# Patient Record
Sex: Female | Born: 1945 | Race: White | Hispanic: No | Marital: Married | Smoking: Never smoker
Health system: Southern US, Community
[De-identification: ages and names within clinical notes are randomized; demographics above are authoritative.]

## PROBLEM LIST (undated history)

## (undated) DIAGNOSIS — J45909 Unspecified asthma, uncomplicated: Secondary | ICD-10-CM

## (undated) DIAGNOSIS — K449 Diaphragmatic hernia without obstruction or gangrene: Secondary | ICD-10-CM

## (undated) DIAGNOSIS — F32A Depression, unspecified: Secondary | ICD-10-CM

## (undated) DIAGNOSIS — H9113 Presbycusis, bilateral: Secondary | ICD-10-CM

## (undated) DIAGNOSIS — F319 Bipolar disorder, unspecified: Secondary | ICD-10-CM

## (undated) DIAGNOSIS — H409 Unspecified glaucoma: Secondary | ICD-10-CM

## (undated) DIAGNOSIS — Z8781 Personal history of (healed) traumatic fracture: Secondary | ICD-10-CM

## (undated) DIAGNOSIS — F3176 Bipolar disorder, in full remission, most recent episode depressed: Secondary | ICD-10-CM

## (undated) DIAGNOSIS — I10 Essential (primary) hypertension: Secondary | ICD-10-CM

## (undated) DIAGNOSIS — M170 Bilateral primary osteoarthritis of knee: Secondary | ICD-10-CM

## (undated) DIAGNOSIS — F419 Anxiety disorder, unspecified: Secondary | ICD-10-CM

## (undated) DIAGNOSIS — R519 Headache, unspecified: Secondary | ICD-10-CM

## (undated) DIAGNOSIS — Z8719 Personal history of other diseases of the digestive system: Secondary | ICD-10-CM

## (undated) DIAGNOSIS — D649 Anemia, unspecified: Secondary | ICD-10-CM

## (undated) DIAGNOSIS — K430 Incisional hernia with obstruction, without gangrene: Secondary | ICD-10-CM

## (undated) DIAGNOSIS — K219 Gastro-esophageal reflux disease without esophagitis: Secondary | ICD-10-CM

## (undated) DIAGNOSIS — K460 Unspecified abdominal hernia with obstruction, without gangrene: Secondary | ICD-10-CM

## (undated) DIAGNOSIS — F329 Major depressive disorder, single episode, unspecified: Secondary | ICD-10-CM

## (undated) DIAGNOSIS — E785 Hyperlipidemia, unspecified: Secondary | ICD-10-CM

## (undated) DIAGNOSIS — K432 Incisional hernia without obstruction or gangrene: Secondary | ICD-10-CM

## (undated) DIAGNOSIS — R0600 Dyspnea, unspecified: Secondary | ICD-10-CM

## (undated) HISTORY — DX: Bipolar disorder, unspecified: F31.9

## (undated) HISTORY — DX: Incisional hernia with obstruction, without gangrene: K43.0

## (undated) HISTORY — DX: Gastro-esophageal reflux disease without esophagitis: K21.9

## (undated) HISTORY — DX: Major depressive disorder, single episode, unspecified: F32.9

## (undated) HISTORY — PX: KYPHOPLASTY: SHX5884

## (undated) HISTORY — DX: Anxiety disorder, unspecified: F41.9

## (undated) HISTORY — DX: Anemia, unspecified: D64.9

## (undated) HISTORY — DX: Personal history of (healed) traumatic fracture: Z87.81

## (undated) HISTORY — DX: Unspecified glaucoma: H40.9

## (undated) HISTORY — PX: APPENDECTOMY: SHX54

## (undated) HISTORY — DX: Personal history of other diseases of the digestive system: Z87.19

## (undated) HISTORY — DX: Bipolar disorder, in full remission, most recent episode depressed: F31.76

## (undated) HISTORY — DX: Hyperlipidemia, unspecified: E78.5

## (undated) HISTORY — PX: BREAST EXCISIONAL BIOPSY: SUR124

## (undated) HISTORY — DX: Unspecified abdominal hernia with obstruction, without gangrene: K46.0

## (undated) HISTORY — DX: Presbycusis, bilateral: H91.13

## (undated) HISTORY — DX: Depression, unspecified: F32.A

## (undated) HISTORY — DX: Bilateral primary osteoarthritis of knee: M17.0

---

## 2006-09-11 ENCOUNTER — Ambulatory Visit: Payer: Self-pay | Admitting: Unknown Physician Specialty

## 2006-11-06 HISTORY — PX: ABDOMINAL HYSTERECTOMY: SHX81

## 2007-06-10 ENCOUNTER — Ambulatory Visit: Payer: Self-pay | Admitting: Internal Medicine

## 2008-01-02 ENCOUNTER — Ambulatory Visit: Payer: Self-pay | Admitting: Emergency Medicine

## 2008-03-24 ENCOUNTER — Ambulatory Visit: Payer: Self-pay | Admitting: Family Medicine

## 2010-09-23 ENCOUNTER — Ambulatory Visit: Payer: Self-pay | Admitting: Ophthalmology

## 2010-09-27 ENCOUNTER — Ambulatory Visit: Payer: Self-pay | Admitting: Ophthalmology

## 2011-08-16 ENCOUNTER — Ambulatory Visit: Payer: Self-pay

## 2011-08-18 ENCOUNTER — Ambulatory Visit: Payer: Self-pay | Admitting: Unknown Physician Specialty

## 2011-08-22 ENCOUNTER — Ambulatory Visit: Payer: Self-pay | Admitting: Unknown Physician Specialty

## 2011-08-23 LAB — PATHOLOGY REPORT

## 2012-01-30 ENCOUNTER — Ambulatory Visit: Payer: Self-pay | Admitting: Ophthalmology

## 2012-01-30 DIAGNOSIS — I499 Cardiac arrhythmia, unspecified: Secondary | ICD-10-CM

## 2012-02-12 ENCOUNTER — Ambulatory Visit: Payer: Self-pay | Admitting: Ophthalmology

## 2015-01-19 ENCOUNTER — Emergency Department: Payer: Self-pay | Admitting: Emergency Medicine

## 2015-01-24 ENCOUNTER — Observation Stay: Payer: Self-pay | Admitting: Internal Medicine

## 2015-02-08 ENCOUNTER — Inpatient Hospital Stay
Admit: 2015-02-08 | Disposition: A | Discharge status: Skilled Nursing Facility | Payer: Self-pay | Attending: Internal Medicine | Admitting: Internal Medicine

## 2015-02-08 LAB — URINALYSIS, COMPLETE
BILIRUBIN, UR: NEGATIVE
Bacteria: NONE SEEN
Blood: NEGATIVE
Glucose,UR: NEGATIVE mg/dL (ref 0–75)
Hyaline Cast: 2
Leukocyte Esterase: NEGATIVE
NITRITE: NEGATIVE
PROTEIN: NEGATIVE
Ph: 5 (ref 4.5–8.0)
Specific Gravity: 1.01 (ref 1.003–1.030)
Squamous Epithelial: <1

## 2015-02-08 LAB — CBC WITH DIFFERENTIAL/PLATELET
Basophil #: 0.1 10*3/uL (ref 0.0–0.1)
Basophil %: 1 %
EOS ABS: 0.1 10*3/uL (ref 0.0–0.7)
Eosinophil %: 2 %
HCT: 42.3 % (ref 35.0–47.0)
HGB: 14.1 g/dL (ref 12.0–16.0)
Lymphocyte #: 0.8 10*3/uL — ABNORMAL LOW (ref 1.0–3.6)
Lymphocyte %: 14.3 %
MCH: 32.7 pg (ref 26.0–34.0)
MCHC: 33.3 g/dL (ref 32.0–36.0)
MCV: 98 fL (ref 80–100)
Monocyte #: 0.9 x10 3/mm (ref 0.2–0.9)
Monocyte %: 17 %
NEUTROS PCT: 65.7 %
Neutrophil #: 3.5 10*3/uL (ref 1.4–6.5)
PLATELETS: 387 10*3/uL (ref 150–440)
RBC: 4.31 10*6/uL (ref 3.80–5.20)
RDW: 15.1 % — ABNORMAL HIGH (ref 11.5–14.5)
WBC: 5.4 10*3/uL (ref 3.6–11.0)

## 2015-02-08 LAB — COMPREHENSIVE METABOLIC PANEL
ALBUMIN: 3.5 g/dL
ALK PHOS: 217 U/L — AB
Anion Gap: 9 (ref 7–16)
BUN: 9 mg/dL
Bilirubin,Total: 0.7 mg/dL
Calcium, Total: 9.2 mg/dL
Chloride: 101 mmol/L
Co2: 28 mmol/L
Creatinine: 0.54 mg/dL
EGFR (African American): >60
EGFR (Non-African Amer.): >60
Glucose: 113 mg/dL — ABNORMAL HIGH
Potassium: 3.7 mmol/L
SGOT(AST): 24 U/L
SGPT (ALT): 19 U/L
Sodium: 138 mmol/L
Total Protein: 6.6 g/dL

## 2015-02-28 NOTE — Op Note (Signed)
PATIENT NAME:  Pamela Ball, Pamela Ball MR#:  [id number] DATE OF BIRTH:  08-21-1946  DATE OF PROCEDURE:  02/12/2012  PREOPERATIVE DIAGNOSIS:  Cataract, right eye.   POSTOPERATIVE DIAGNOSIS:  Cataract, right eye.  PROCEDURE PERFORMED:  Extracapsular cataract extraction using phacoemulsification with placement of an Alcon [id number], 21.0-diopter posterior chamber lens, serial # [id number].  SURGEON:  Loura Back. Glynda Soliday, MD  ASSISTANT:  None.  ANESTHESIA:  4% lidocaine and 0.75% Marcaine in a 50/50 mixture with 10 units per mL of Hylenex added, given as peribulbar.  ANESTHESIOLOGIST:  Dr. Marcello Moores   COMPLICATIONS:  None.  ESTIMATED BLOOD LOSS:  Less than 1 mL.  DESCRIPTION OF PROCEDURE:  The patient was brought to the operating room and given a peribulbar block.  The patient was then prepped and draped in the usual fashion.  The vertical rectus muscles were imbricated using 5-0 silk sutures.  These sutures were then clamped to the sterile drapes as bridle sutures.  A limbal peritomy was performed extending two clock hours and hemostasis was obtained with cautery.  A partial thickness scleral groove was made at the surgical limbus and dissected anteriorly in a lamellar dissection using an Alcon crescent knife.  The anterior chamber was entered superonasally with a Superblade and through the lamellar dissection with a 2.6 mm keratome.  DisCoVisc was used to replace the aqueous and a continuous tear capsulorrhexis was carried out.  Hydrodissection and hydrodelineation were carried out with balanced salt and a 27 gauge canula.  The nucleus was rotated to confirm the effectiveness of the hydrodissection.  Phacoemulsification was carried out using a divide-and-conquer technique.  Total ultrasound time was 1 minute and 7 seconds with an average power of 17.3 percent.  Irrigation/aspiration was used to remove the residual cortex.  DisCoVisc was used to inflate the capsule and the internal incision was enlarged  to 3 mm with the crescent knife.  The intraocular lens was folded and inserted into the capsular bag using the AcrySert delivery system.  Irrigation/aspiration was used to remove the residual DisCoVisc.  Miostat was injected into the anterior chamber through the paracentesis track to inflate the anterior chamber and induce miosis.  The wound was checked for leaks and none were found. The conjunctiva was closed with cautery and the bridle sutures were removed.  Two drops of 0.3% Vigamox were placed on the eye.   An eye shield was placed on the eye.  The patient was discharged to the recovery room in good condition.  ____________________________ Loura Back Canyon Lohr, MD sad:drc D: 02/12/2012 12:10:20 ET T: 02/12/2012 13:13:43 ET JOB#: [id number]  cc: Remo Lipps A. Efrat Zuidema, MD, <Dictator> Martie Lee MD ELECTRONICALLY SIGNED 02/19/2012 13:57

## 2015-03-01 LAB — SURGICAL PATHOLOGY

## 2015-03-07 NOTE — Consult Note (Signed)
Brief Consult Note: Diagnosis: Comminuted right proximal humeral neck and shaft fractures.   Patient was seen by consultant.   Recommend further assessment or treatment.   Comments: 69 year old female fell [place] 12/21/14 at home and injured the right upper arm.  Seen in [facility] where X-rays showed fracture of the humeral neck and shaft with minimal displacement.  She was splinted and went home.  She became disoriented and hypoxic on pain meds Sunday  12/26/14 and was admitted for care and evaluation. Given Narcan x2.  She complains of upper arm pain. No other orthopaedic complaints.   Exam:  Awake and mostly oriented.  circulation/sensation/motor function good right hand.  Splints in place.  Pain with range of motion of shoulder.    X-rays: as above  Rx:  Continue present splint. Will revise tomorrow.  Do not feel that surgery is indicated at this time.        Sling.  Electronic Signatures: Park Breed (MD)  (Signed 20-Mar-16 13:46)  Authored: Brief Consult Note   Last Updated: 20-Mar-16 13:46 by Park Breed (MD)

## 2015-03-07 NOTE — H&P (Signed)
PATIENT NAME:  Pamela Ball, Pamela Ball MR#:  [id number] DATE OF BIRTH:  November 15, 1945  DATE OF ADMISSION:  01/24/2015  REFERRING PHYSICIAN: Shellia Cleverly, MD.   FAMILY PHYSICIAN: Meindert A. Brunetta Genera, MD.   REASON FOR ADMISSION: Narcotics overdose with altered mental status.   HISTORY OF PRESENT ILLNESS: The patient is a 69 year old female with a history of chronic back pain, bipolar disorder and depression, presents in the emergency room several days ago with a recent fall, injuring her right side. Was sent home on pain medications. Presents now with continued arm pain as well as lethargy and confusion. In the emergency room, the patient was noted to be extremely lethargic and hypoxic. Was given Narcan with temporary improvement, but then had recurrent symptoms requiring another dose of Narcan. She is currently on oxygen and confused and is now admitted for further evaluation.   PAST MEDICAL HISTORY: 1. Chronic back pain.  2. Bipolar disorder.  3. Depression.  4. Recent right-sided trauma.  5. History of cataract surgery.   MEDICATIONS: Percocet 5/325, 1 p.o. every 6 hours.  Omeprazole 20 mg p.o. daily.  Zyprexa 5 mg p.o. daily.  Wellbutrin-XL 150 mg p.o. daily.   ALLERGIES: STEROIDS, CODEINE AND TAPE.   SOCIAL HISTORY: Negative for alcohol or tobacco abuse.   FAMILY HISTORY: Positive for hypertension and stroke.   REVIEW OF SYSTEMS: Unable to obtain from the patient due to her confusion.   PHYSICAL EXAMINATION: GENERAL: The patient is confused, in no acute distress.  VITAL SIGNS: Currently remarkable for a blood pressure of 150/92 with a heart rate of 93, a respiratory rate of 16, temperature 98.1, saturation 95% on 2 liters of oxygen.  HEENT: Normocephalic, atraumatic. Pupils equally round and reactive to light and accommodation. Extraocular movements are intact. Sclerae are anicteric. Conjunctivae are clear. Oropharynx is clear.  NECK: Supple without JVD. No adenopathy or thyromegaly is  noted.  LUNGS: Revealed decreased breath sounds without wheezes or rales. No rhonchi. Respiratory effort is decreased.  CARDIAC: Regular rate and rhythm with normal S1 and S2. No significant rubs or gallops. PMI is nondisplaced. Chest wall is nontender.  ABDOMEN: Soft and nontender with normoactive bowel sounds. No organomegaly or masses were appreciated. No hernias or bruits were noted.  EXTREMITIES: Without clubbing, cyanosis or edema. Pulses were 2+ bilaterally.  SKIN: Warm and dry without rash or lesions.  NEUROLOGIC: Cranial nerves II through XII grossly intact. Deep tendon reflexes were symmetric. Motor and sensory examination is nonfocal.  PSYCHIATRIC: Revealed a patient who was lethargic, confused and was answering questions inconsistently.   LABORATORY DATA: Chest x-ray revealed a humeral fracture with no acute cardiopulmonary disease. Her white count was 9.6 with a hemoglobin of 12.1. Sodium was 130 with a glucose of 175 and a BUN of 22 with a creatinine of 0.74 and a GFR of greater than 60. Troponin was 0.03.   ASSESSMENT: 1. Altered mental status.  2. Acute respiratory distress.  3. Narcotics overdose.  4. Hyponatremia.  5. Bipolar disorder.  6. Chronic back pain.  7. Right humeral fracture.   PLAN: The patient will be observed on the orthopedic floor. She will begin IV fluids with DuoNeb small volume nebulizers. We will limit her narcotic intake. We will consult orthopedics, physical therapy, occupational therapy and care management. Follow up routine labs in the morning. Neuro checks every 4 hours. Further treatment and evaluation will depend upon the patient's progress.   TOTAL TIME SPENT ON THIS PATIENT: 45 minutes.  ____________________________ Leonie Douglas Doy Hutching, MD jds:TT D: 01/24/2015 11:21:54 ET T: 01/24/2015 11:40:49 ET JOB#: [id number]  cc: Leonie Douglas. Doy Hutching, MD, <Dictator> Meindert A. Brunetta Genera, MD Ashlay Altieri Lennice Sites MD ELECTRONICALLY SIGNED 01/24/2015 14:30

## 2015-03-07 NOTE — Op Note (Signed)
PATIENT NAME:  Pamela Ball, Pamela Ball MR#:  [id number] DATE OF BIRTH:  [birth year]  DATE OF PROCEDURE:  02/11/2015  PREOPERATIVE DIAGNOSIS:  L1 compression fracture.   POSTOPERATIVE DIAGNOSIS:  L1 compression fracture.   PROCEDURE:  L1 kyphoplasty.   ANESTHESIA:  MAC.   SURGEON:  Laurene Footman, MD   DESCRIPTION OF PROCEDURE:  The patient was brought to the operating room, and after adequate anesthesia was obtained, the patient was placed prone. Adequate visualization of L1 was obtained in both AP and lateral projections. After patient identification and timeout procedures were completed, 5 mL was infiltrated on the right and left side in the area of the planned incision. The back was then prepped and draped in sterile fashion. After patient identification and timeout procedure were completed again, local anesthetic was infiltrated down to the pedicle with approximately 15 mL at each level of 0.5% Sensorcaine and 0.5% Xylocaine with epinephrine. A small skin incision was made and a trocar used to advance on the right side initially and then on the left in a transpedicular approach, getting into the vertebral body, checking on both AP and lateral projections as the trocar was advanced. Biopsy was obtained. Drilling was carried out and balloon was inserted. Inflation was carried out on both sides, giving very good correction of the compression deformity. The left side was filled first followed by the right, and then Dermabond was applied to the wounds. The patient tolerated the procedure well.   ESTIMATED BLOOD LOSS:  Minimal.   COMPLICATIONS:  None.   SPECIMEN:  L1 vertebral body.    ____________________________ Laurene Footman, MD mjm:nb D: 02/11/2015 22:22:07 ET T: 02/12/2015 03:48:13 ET JOB#: [id number]  cc: Laurene Footman, MD, <Dictator> Laurene Footman MD ELECTRONICALLY SIGNED 02/13/2015 22:25

## 2015-03-07 NOTE — Consult Note (Signed)
Brief Consult Note: Diagnosis: acute L1 compression.   Patient was seen by consultant.   Orders entered.   Comments: plan kypho tomorrow.  Electronic Signatures: Laurene Footman (MD)  (Signed 06-Apr-16 12:26)  Authored: Brief Consult Note   Last Updated: 06-Apr-16 12:26 by Laurene Footman (MD)

## 2015-03-07 NOTE — H&P (Addendum)
PATIENT NAME:  Pamela Ball, Pamela Ball MR#:  [id number] DATE OF BIRTH:  25-May-1946  DATE OF ADMISSION:  02/08/2015  PRIMARY CARE PHYSICIAN: Meindert A. Brunetta Genera, MD  EMERGENCY ROOM PHYSICIAN: Conni Slipper, MD  CHIEF COMPLAINT: Back pain.   HISTORY OF PRESENT ILLNESS: This is a 69 year old female patient who had a fall 2 weeks ago at home and has been having severe back pain since then. The patient was here from 03/20 to the 22nd because of narcotic overdose. At that time, the patient went home on tramadol and the patient's symptoms improved. However, she says that after she went home she lost her balance and fell backwards and hit her back and since then she is unable to ambulate and it is worse when she gets up and walks and also it is worse when she sits. The pain is mainly in the lower back. No weakness in the legs. The patient denies any urinary retention. No other troubles. The patient has an acute L1 vertebral compression fracture with 50% height loss.  The patient's other labs are pending at this time. The patient denies any other complaints in terms of trouble breathing, wheezing, chest pain, nausea, vomiting, or diarrhea.    PAST MEDICAL HISTORY: Significant for depression and recent right shoulder fracture, getting splint on the right shoulder, and Dr. Sabra Heck is following. The patient has appointment with Dr. Sabra Heck on 04/16. She has history of bipolar disorder and chronic back pain.   ALLERGIES: ALLERGIC TO STEROIDS, CODEINE, AND TAPE.   SOCIAL HISTORY: No smoking, no drinking, lives with husband.   FAMILY HISTORY: Significant for hypertension and stroke.   PAST SURGICAL HISTORY: Significant for cataract operation and also history of L2 compression fracture repair with kyphoplasty before.   HOME MEDICATIONS:  1.  Bupropion 150 mg extended release daily.  2.  BuSpar 10 mg p.o. b.i.d.  3.  Colace 100 mg p.o. b.i.d.  4.  Olanzapine 5 mg p.o. daily.  5.  Omeprazole 20 mg p.o. daily.  6.   ProAir 90 mcg 2 puffs every 4 hours.  7.  Simvastatin 20 mg p.o. daily.  8.  Tramadol 50 mg every 4 hours as needed for pain.  9.  Venlafaxine 75 mg extended release p.o. daily.   REVIEW OF SYSTEMS:  CONSTITUTIONAL: No fever. No fatigue.  EYES: No blurred vision, no double vision, and no inflammation.  EARS, NOSE, THROAT: No tinnitus. No ear pain. No epistaxis. The patient denies any difficulty swallowing. RESPIRATORY: No cough. No wheezing.  CARDIOVASCULAR: No chest pain, no orthopnea, no PND, no palpitations.  GASTROINTESTINAL: No nausea. No vomiting. No abdominal pain.  GENITOURINARY: No dysuria. The patient has a history of overactive bladder.  ENDOCRINE: No polyuria or nocturia.  HEMATOLOGIC: No anemia.  MUSCULOSKELETAL: The patient has severe back pain and decreased ambulation secondary to back pain. PSYCHIATRIC: She does have a history of bipolar disorder, but her mood is stable at this time.   PHYSICAL EXAMINATION:  VITAL SIGNS: Temperature 98.7 Fahrenheit, heart rate 103, blood pressure 141/78, saturations 93% on room air.  GENERAL: Well-developed, well-nourished female not in distress.  HEAD: Atraumatic, normocephalic.  EYES: Pupils equal, reacting to light. No conjunctival pallor. No icterus. Extraocular movements intact.  NOSE: No nasal lesions, no drainage.  EARS: No drainage, no external lesions.  MOUTH: No lesions, no exudates.  NECK: Supple. No JVD. No carotid bruit. Normal range of motion.  RESPIRATORY: Bilateral breath sounds present. Clear to auscultation. No wheeze. No rales.  CARDIOVASCULAR: S1,  S2 regular, slightly tachycardic due to pain in the back and also right arm. The patient has no pedal edema. Does have good femoral and pedal pulse.  GASTROINTESTINAL: Abdomen is soft, nontender, nondistended. Bowel sounds present. No organomegaly. No hernias.  MUSCULOSKELETAL: The patient's extremities move x 4. She does not have any focal tenderness in hip area, but has  some slight tenderness in the lower back area. Range of motion is adequate in both legs. Straight leg raising test is negative. Power is 5/5, upper and lower extremities.  SKIN: Inspection is normal, well-hydrated, no diaphoresis.  VASCULAR: Good pedal pulse.  NEUROLOGIC: The patient is alert, awake, oriented. Cranial nerves II through XII intact. DTRs 2+ bilaterally. The patient sensations are intact bilaterally in upper and lower extremities. Power is 5/5 in upper and lower extremities. Ttest is negative bilaterally.  PSYCHIATRIC: The patient's judgment and insight are adequate. Alert, oriented x 3.   LABORATORY DATA: Urinalysis is clear. No infection. CBC and (bmp are pending  CT of lumbar spine without contrast shows and acute L1 vertebral body compression fracture with 50% height loss. The patient has 5 mm retropulsion of posterior margin of L1 vertebral body with moderate narrowing of central canal. The patient has severe bladder distention.  Chronic L2 vertebral body compression fracture.    ASSESSMENT AND PLAN:  8.  A 69 year old female patient with fall, suffered acute L1 compression fracture. Admitted to orthopedic service, started on pain medications, and nausea medicine. The patient will have pain management evaluation for possible L1 kyphoplasty.  2.  Depression. She is on Effexor and also bupropion. Continue them.  3.  History of bipolar. She is on BuSpar and olanzapine. Continue them.  4.  History of right shoulder fracture. The patient has a sling and splint to the right shoulder. Continue that and Dr. Sabra Heck see her in followup in the clinic as scheduled.  5.  Deep vein thrombosis prophylaxis with Lovenox 40 units subcutaneous daily.   TIME SPENT: About 55 minutes.    ____________________________ Epifanio Lesches, MD sk:TT D: 02/08/2015 17:36:00 ET T: 02/08/2015 18:24:42 ET JOB#: [id number]  cc: Epifanio Lesches, MD, <Dictator> Meindert A. Brunetta Genera, MD Epifanio Lesches MD ELECTRONICALLY SIGNED 03/09/2015 12:58

## 2015-03-07 NOTE — Discharge Summary (Signed)
PATIENT NAME:  Pamela Ball, Pamela Ball MR#:  [id number] DATE OF BIRTH:  Feb 17, 1946  DATE OF ADMISSION:  01/24/2015 DATE OF DISCHARGE:  01/26/2015  DISCHARGE DIAGNOSES: 1. Altered mental status with respiratory distress secondary to narcotic overdose, intentional.  The patient's mental status improved.  2. Right humeral fracture, nondisplaced, has a splint on the right shoulder.  3. Bipolar depression.   DISCHARGE MEDICATIONS:  1. Bupropion 150 mg, this is Wellbutrin XL 150 mg p.o. daily.  2. Olanzapine 5 mg p.o. daily.  3. Omeprazole 20 mg daily.  4. Percocet 5/325, one tablet every 6-8 hours as needed for pain.  5. Venlafaxine 75 mg p.o. daily.  6. BuSpar 10 mg p.o. b.i.d.  7. Simvastatin 20 mg daily.  8. ProAir  2 puffs every 4 hours as needed for wheezing.  9. Mobic 7.5 mg p.o. b.i.d.  10. Tramadol 50 mg every 6 hours as needed for pain, so the patient is given prescription for tramadol.    CONSULTATIONS: Orthopedic consult with Dr. Earnestine Leys.   HOSPITAL COURSE: A 69 year old female patient admitted because of noncardiac overdose with altered mental status. The patient has a history of bipolar disorder, chronic back pain, depression, comes in because of extreme lethargy and also hypoxia. The patient had a fall several days ago and injured her right shoulder and husband was giving her on oxycodone 5 mg every 2 hours at home. The patient found lethargic and unresponsive by  husband.  Because of that,  she was brought in here. The patient's oxygen saturation was 77% on room air when she came. The patient thought to have narcotic overdose with respiratory depression and she received  Narcan in ER.  The patient received 2 mg of Narcan and watched her for observation status.  The patient's mental status  improved and lethargy improved; however, she had significant pain in the right shoulder. Her oxygen saturation also improved and she was maintained 99% on 2 liters of oxygen. She was admitted to  medical service and was continued on oxygen and IV fluids and limited the use of narcotics. The patient was seen by Dr. Earnestine Leys the patient has fracture of humeral  neck and the shaft with minimal displacement on the right side. The patient was seen in the Emergency Room on February 15, at that time, splint was done and she went home and the patient was seen by him. He said to continue the splint and he did not think the patient needed surgery, so we continued the splint for the right hand and the patient circulation, sensation and motor exam was normal. According to her husband, he was giving 5 mg of oxycodone every 2 hours.   So the patient's mental status improved, hypoxia  improved.   We started her on tramadol and also Percocet and she has mentioned that her  pain is  much better and we gave her a prescription for tramadol and Percocet and explained that she can take only on regularly scheduled intervals and cannot take more than prescribed. The patient was seen by physical therapy. They recommended home health.  We will arrange home health physical therapy.   The patient will see Dr. Earnestine Leys on April 12 at 9:15 a.m.   DISCHARGE VITAL SIGNS: Temperature 97.7, heart rate 84, blood pressure 120/70, saturation is 93% on room air with rest and 93 with exertion on room air.   PHYSICAL EXAMINATION:    CARDIOVASCULAR: Showed S1, S2.  LUNGS: Clear to auscultation. No wheeze.  No rales.  ABDOMEN: Soft, nontender, nondistended.  Bowel sounds present.   EXTREMITIES:   Right arm has splint present and the patient has good peripheral pulse and no swelling and the patient was able to move all the fingers.   LABORATORY DATA DURING THE HOSPITAL STAY:   Her urine was clear. No leukocyte esterase. No nitrates ,  Sodium 130, potassium 4.2, chloride 93, bicarbonate 27, BUN 22, creatinine 0.7, blood glucose 125. Troponins are less than 0.03, white count 5.24, hemoglobin 9.6, hematocrit 29.5, platelets 182,000.  The patient's chest x-ray showed proximal right humeral fracture. No acute intrathoracic abnormality.   Hiatal hernia. The patient went home in stable condition.    TIME SPENT:  More than 30 minutes.    ____________________________ Epifanio Lesches, MD sk:tr D: 01/27/2015 10:36:00 ET T: 01/27/2015 16:12:40 ET JOB#: [id number]  cc: Epifanio Lesches, MD, <Dictator> Park Breed, MD Meindert A. Brunetta Genera, MD  Epifanio Lesches MD ELECTRONICALLY SIGNED 01/28/2015 13:33

## 2015-03-07 NOTE — Discharge Summary (Signed)
PATIENT NAME:  Pamela Ball, GOMBOS MR#:  [id number] DATE OF BIRTH:  20-May-1946  DATE OF ADMISSION:  02/08/2015 DATE OF DISCHARGE:  02/11/2015  PRIMARY CARE PHYSICIAN: Lorelee Market, MD  FINAL DIAGNOSES: 1.  Acute to subacute L1 compression fracture.  2.  Anxiety and depression.  3.  Nausea.  4.  Hyperlipidemia.   DISCHARGE MEDICATIONS: Include olanzapine 5 mg in the morning, omeprazole 20 mg in the morning, BuSpar 10 mg twice a day, ProAir HFA 2 puffs every 4 hours as needed for shortness of breath, Wellbutrin extended-release 150 mg daily, Colace 100 mg twice a day, simvastatin 20 mg in the morning, venlafaxine 75 mg daily, tramadol 50 mg every 4 hours as needed for pain, Boost Breeze 237 mL 3 times a day with meals, calcitonin nasal spray 1 spray alternating nostrils once a day, Fentanyl 12 mcg patch topically every 3 days, prednisone taper 5 mg 3 tablets day one, 2 tablets day two, 1 tablet day three and four.   DISCHARGE INSTRUCTIONS: Follow up in 1 to 2 days with the doctor at rehab. Follow up with Dr. Rudene Christians in 2 weeks.   HISTORY: The patient was admitted February 08, 2015, anticipated discharge in the evening of February 11, 2015 to rehab. The patient came in with back pain, found to have a L1 compression fracture, 50% height loss with moderate narrowing of the central canal. Hospitalist services were contacted for further evaluation.   LABORATORY AND RADIOLOGICAL DATA DURING THE HOSPITAL COURSE: Included an EKG that showed progressive vertebral body height loss, L1 vertebral fracture, 50% height loss.   CT scan of the lumbar spine showed an acute L1 vertebral body compression fracture with approximately 50% height loss, 5 mm retropulsion of the superior posterior margin of the L1 vertebral body with moderate narrowing of the central canal.   Urinalysis negative. Glucose 113, BUN 9, creatinine 0.54, sodium 138, potassium 3.7, chloride 101, CO2 28, calcium 9.2. Liver function tests normal range.  White blood cell count 5.4, hemoglobin and hematocrit 14.1 and 42.3, and platelet count 387,000   HOSPITAL COURSE PER PROBLEM LIST:  1.  For the patient's acute to subacute L1 compression fracture, the patient was seen in consultation by pain management, Dr. Dossie Arbour, who also does kyphoplasty. He could not do the procedure until Monday so Dr. Rudene Christians, orthopedics, was consulted on April 6th and he was going to do the procedure on April 7th. The patient could potentially go to rehab in the evening of April 7th. Pain control with fentanyl patch, calcitonin nasal spray, tramadol p.r.n.  2.  Anxiety and depression. On numerous psychiatric medications. No changes in those medications.  3.  Nausea, likely from the pain medications.  4.  Hyperlipidemia. On simvastatin.  TIME SPENT ON DISCHARGE: 35 minutes.   ____________________________ Tana Conch. Leslye Peer, MD rjw:sb D: 02/11/2015 14:14:43 ET T: 02/11/2015 14:37:27 ET JOB#: [id number]  cc: Tana Conch. Leslye Peer, MD, <Dictator> Meindert A. Brunetta Genera, [place] SIGNED 02/11/2015 15:43

## 2015-03-07 NOTE — Consult Note (Signed)
PATIENT NAME:  Pamela Ball, Pamela Ball MR#:  [id number] DATE OF BIRTH:  Oct 21, 1946  DATE OF CONSULTATION:  02/10/2015  REFERRING PHYSICIAN:   CONSULTING PHYSICIAN:  Laurene Footman, MD  REASON FOR CONSULTATION: Back pain.   HISTORY OF PRESENT ILLNESS: The patient was admitted after having a fall at home 2 weeks ago, and she has been having persisting back pain. Dr Dossie Arbour has seen patient, but was unable to accommodate her during this hospitalization. I was consulted to see if I could take care of this tomorrow. She has an L1 compression fracture involving at least 50% of the height with some retropulsion. She also has a recent right proximal humerus fracture being treated nonoperatively by Dr. Earnestine Leys.   PHYSICAL EXAMINATION: She has ecchymosis over the of the mid back and low back related to a prior fall. She is tender to percussion at L1. She has negative straight leg raising bilaterally. No clonus sensation intact   IMAGING: CT reviewed, showing acute L1 fracture with prior L2 kyphoplasty of approximately 4 years ago by Dr. Mauri Pole. The patient is having great deal of difficulty standing and walking, secondary to her back pain.   IMPRESSION: This is an acute L1 compression fracture.   PLAN: Kyphoplasty tomorrow,   ____________________________ Laurene Footman, MD [email or web address] D: 02/10/2015 12:38:23 ET T: 02/10/2015 12:45:35 ET JOB#: [id number]  cc: Laurene Footman, MD, <Dictator> Laurene Footman MD ELECTRONICALLY SIGNED 02/11/2015 6:31

## 2017-01-29 ENCOUNTER — Encounter: Payer: Self-pay | Admitting: Family Medicine

## 2017-01-29 ENCOUNTER — Ambulatory Visit (INDEPENDENT_AMBULATORY_CARE_PROVIDER_SITE_OTHER): Payer: Medicare Other | Admitting: Family Medicine

## 2017-01-29 DIAGNOSIS — Z0000 Encounter for general adult medical examination without abnormal findings: Secondary | ICD-10-CM

## 2017-01-29 NOTE — Progress Notes (Signed)
Patient was not seen today for office visit. The appointment was cancelled.  Patient would be a new patient at the office, she arrived late, and without the requested new patient paperwork. She was not accompanied by any family member or anyone else to help with paperwork or historical questions. She was unable to provide any significant history to our nursing staff. Also there are initial problem with her insurance, which was later resolved.  By the time I was involved and updated, it was already >40 minutes past her appointment time. I advised that she should be given the new patient packet, and notify her husband to help complete this paperwork, and requested that if she cannot provide adequate history, and needs assistance she needs to be accompanied at all future visits in order to evaluate her properly. Once she has completed the paperwork and submitted it for our review, then she has our contact number and can re-schedule in future as new patient if she is interested to establish care at our office, otherwise patient has not been established here yet, and is not actively our patient as of today 01/29/17.  [name], [facility] 01/29/2017, 5:21 PM

## 2017-02-05 ENCOUNTER — Other Ambulatory Visit: Payer: Self-pay | Admitting: Family Medicine

## 2017-02-05 MED ORDER — BUSPIRONE HCL 10 MG PO TABS: 10.0000 mg | ORAL_TABLET | Freq: Every day | ORAL | 0 refills | Status: DC

## 2017-02-05 NOTE — Telephone Encounter (Signed)
Agreed to send in e-script of Buspirone 10mg  daily #10 tabs with 0 refills [facility] [place] [facility]. This is only temporary rx and will not provide refills until patient establishes in office to discuss medical history and review all meds. I asked to clarify dosing, and they stated that this was a once daily med and not BID or TID as normally prescribed.  [name], [facility] 02/05/2017, 4:11 PM

## 2017-02-05 NOTE — Telephone Encounter (Signed)
Pt has new patient appt scheduled for 4/12.  She is out of buspirone 10 mg and she asked if a 10 day [facility] [place] to get her thru till the appt..  She called previous dr but his office is closed today.  The call back number is [phone]

## 2017-02-15 ENCOUNTER — Encounter: Payer: Self-pay | Admitting: Family Medicine

## 2017-02-15 ENCOUNTER — Ambulatory Visit (INDEPENDENT_AMBULATORY_CARE_PROVIDER_SITE_OTHER): Payer: Medicare Other | Admitting: Family Medicine

## 2017-02-15 VITALS — BP 121/68 | HR 79 | Temp 98.4°F | Resp 16 | Ht 59.0 in | Wt 135.4 lb

## 2017-02-15 DIAGNOSIS — K219 Gastro-esophageal reflux disease without esophagitis: Secondary | ICD-10-CM

## 2017-02-15 DIAGNOSIS — Z8781 Personal history of (healed) traumatic fracture: Secondary | ICD-10-CM | POA: Diagnosis not present

## 2017-02-15 DIAGNOSIS — J449 Chronic obstructive pulmonary disease, unspecified: Secondary | ICD-10-CM | POA: Diagnosis not present

## 2017-02-15 DIAGNOSIS — H9113 Presbycusis, bilateral: Secondary | ICD-10-CM | POA: Diagnosis not present

## 2017-02-15 DIAGNOSIS — F3176 Bipolar disorder, in full remission, most recent episode depressed: Secondary | ICD-10-CM | POA: Diagnosis not present

## 2017-02-15 DIAGNOSIS — E785 Hyperlipidemia, unspecified: Secondary | ICD-10-CM | POA: Insufficient documentation

## 2017-02-15 DIAGNOSIS — M170 Bilateral primary osteoarthritis of knee: Secondary | ICD-10-CM | POA: Insufficient documentation

## 2017-02-15 DIAGNOSIS — E782 Mixed hyperlipidemia: Secondary | ICD-10-CM | POA: Diagnosis not present

## 2017-02-15 DIAGNOSIS — F419 Anxiety disorder, unspecified: Secondary | ICD-10-CM

## 2017-02-15 DIAGNOSIS — Z7689 Persons encountering health services in other specified circumstances: Secondary | ICD-10-CM

## 2017-02-15 DIAGNOSIS — J432 Centrilobular emphysema: Secondary | ICD-10-CM | POA: Insufficient documentation

## 2017-02-15 HISTORY — DX: Bipolar disorder, in full remission, most recent episode depressed: F31.76

## 2017-02-15 HISTORY — DX: Bilateral primary osteoarthritis of knee: M17.0

## 2017-02-15 HISTORY — DX: Presbycusis, bilateral: H91.13

## 2017-02-15 HISTORY — DX: Gastro-esophageal reflux disease without esophagitis: K21.9

## 2017-02-15 HISTORY — DX: Anxiety disorder, unspecified: F41.9

## 2017-02-15 HISTORY — DX: Personal history of (healed) traumatic fracture: Z87.81

## 2017-02-15 HISTORY — DX: Chronic obstructive pulmonary disease, unspecified: J44.9

## 2017-02-15 MED ORDER — FLUTICASONE-SALMETEROL 250-50 MCG/DOSE IN AEPB: 1.0000 | INHALATION_SPRAY | Freq: Two times a day (BID) | RESPIRATORY_TRACT | 3 refills | Status: DC

## 2017-02-15 MED ORDER — BUSPIRONE HCL 10 MG PO TABS: 10.0000 mg | ORAL_TABLET | Freq: Every day | ORAL | 1 refills | Status: DC

## 2017-02-15 MED ORDER — SIMVASTATIN 20 MG PO TABS: 20.0000 mg | ORAL_TABLET | Freq: Every evening | ORAL | 3 refills | Status: DC

## 2017-02-15 NOTE — Assessment & Plan Note (Signed)
Stable, chronic bilateral genearlized Knee pain Known knee OA/DJD.  - Able to bear weight, no knee instability - No prior history of knee surgery, arthroscopy - Inadequate conservative therapy, using BC powder occasionally, no other meds  Plan: 1. Advised to stop [facility] regular use and instead start Tylenol 500-1000mg  per dose TID PRN or more regular dosing 2. Hold NSAIDs for now, prefer to avoid muscle relaxants or sedating medications 3. RICE therapy (rest, ice, compression, elevation) for swelling, activity modification 4. Consider future x-rays if needed for baseline 5. Follow-up as needed - previously established with [facility] can return as needed

## 2017-02-15 NOTE — Progress Notes (Signed)
Subjective:    Patient ID: Pamela Ball, female    DOB: 02-21-1946, 71 y.o.   MRN: [id number]  Pamela Ball is a 71 y.o. female presenting on 02/15/2017 for Establish Care  Previously followed by PCP Dr Brunetta Genera [facility]), now changing to [facility] due to insurance coverage reasons. She is accompanied by her husband today, Skylie Hiott.  HPI   Bipolar Depression, Controlled in Remission / Anxiety - Reports a chronic history of Bipolar Depression diagnosis about 2-3 years ago, unsure if had mental health problems before as they were not diagnosed. She states since she has been on current medication regimen her symptoms have been controlled without problems. She has not seen Psychiatry before, and was managed only by [facility] Dr Brunetta Genera - Today reports doing well. Recently she had a problem when ran out of her Buspirone medication, she had some worsening anxiety and tremors. She requested a temporary refill from our office, which she was given to get to todays apt. 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 However her vision does limit her significantly, cannot drive and has difficulty with some tasks at home. She is followed by [facility] (Dr Dingledein), has had prior laser eye surgery still difficulty with vision, wears corrective lenses  GERD Chronic problem without recent concerns. Controlled on Omeprazole 32m daily. Has existing rx does not need refill currently. Not had prior PUD or EGD.  H/o COPD: Reports known history of COPD. But she is a never smoker, her husband smokes outside. - Uses Advair inhaler twice daily with good results, has rescue inhaler Albuterol only uses as needed - Has not had significant COPD flare up - Never  seen lung doctor or done pulmonary function testing - Admtis some dyspnea with moderate walking or exercise  Osteoarthritis, Bilateral Knees / History of compression fracture - Reports chronic history of some arthritis in both knees, without any significant prior injury or major problems. Has never had knee procedure or surgery. No recent X-rays of knees. Known DJD with Lumbar Spine and prior history of compression fracture in spine, s/p kyphoplasty - [facility](Dr HMarry Guan and TDor[facility]issPA) - [facility] pain, not taking regular tylenol or NSAID  Lifestyle - No particular diet. Admits good appetite. Drinks 3 bottles water daily. No regular exercise.  Additional Social History: - Lives at home with husband, BMortimer Fries They have a pet cat. [place], limited transportation. - Her son lives nearby, and she now has a recent granddaughter of 3 months  Health maintenance: - Reports outside records had Pap Smear, Mammogram and Bone Density testing in 2017 - awaiting outside records from prior PCP - Due for Pneumonia vaccine, has not had one dose before, would start Prevnar-13 - declined today will get next time - Due for TDap  Depression screen PHQ 2/9 02/15/2017  Decreased Interest 0  Down, Depressed, Hopeless 0  PHQ - 2 Score 0  Altered sleeping 0  Tired, decreased energy 3  Change in appetite 0  Feeling bad or failure about yourself  0  Trouble concentrating 1  Moving slowly or fidgety/restless 1  Suicidal thoughts 0  PHQ-9 Score 5  Difficult doing work/chores Not difficult at  all    Past Medical History:  Diagnosis Date  . Bipolar affective disorder ([place])   . Depression   . Glaucoma   . Hyperlipidemia    Past Surgical History:  Procedure Laterality Date  . ABDOMINAL HYSTERECTOMY  11/06/2006  . KYPHOPLASTY     Social History   Social History  . Marital status: Married     Spouse name: Tinita Brooker  . Number of children: N/A  . Years of education: High School   Occupational History  . Not on file.   Social History Main Topics  . Smoking status: Never Smoker  . Smokeless tobacco: Never Used  . Alcohol use No  . Drug use: No  . Sexual activity: Not on file   Other Topics Concern  . Not on file   Social History Narrative  . No narrative on file   Family History  Problem Relation Age of Onset  . Cancer Mother     cancer   Current Outpatient Prescriptions on File Prior to Visit  Medication Sig  . buPROPion (WELLBUTRIN XL) 150 MG 24 hr tablet Take 150 mg by mouth.  . OLANZapine (ZYPREXA) 5 MG tablet Take 5 mg by mouth.  Marland Kitchen omeprazole (PRILOSEC) 20 MG capsule   . venlafaxine XR (EFFEXOR-XR) 75 MG 24 hr capsule Take by mouth.   No current facility-administered medications on file prior to visit.     Review of Systems  Constitutional: Negative for activity change, appetite change, chills, diaphoresis, fatigue, fever and unexpected weight change.  HENT: Positive for hearing loss (bilateral hearing aid). Negative for congestion.   Eyes: Positive for visual disturbance (Corrective lenses).  Respiratory: Negative for apnea, cough, chest tightness, shortness of breath and wheezing.   Cardiovascular: Negative for chest pain, palpitations and leg swelling.  Gastrointestinal: Negative for abdominal pain, blood in stool, constipation, diarrhea, nausea and vomiting.  Endocrine: Negative for cold intolerance and polyuria.  Genitourinary: Negative for decreased urine volume, difficulty urinating, dysuria, frequency, hematuria and urgency.  Musculoskeletal: Positive for arthralgias (bilateral knees). Negative for back pain, gait problem, myalgias and neck pain.  Skin: Negative for rash.  Allergic/Immunologic: Negative for environmental allergies.  Neurological: Positive for tremors. Negative for dizziness, weakness, light-headedness, numbness and headaches.    Hematological: Negative for adenopathy.  Psychiatric/Behavioral: Negative for agitation, behavioral problems, confusion, decreased concentration, dysphoric mood (Improved), hallucinations, self-injury, sleep disturbance and suicidal ideas. The patient is not nervous/anxious (Improved).    Per HPI unless specifically indicated above     Objective:    BP 121/68   Pulse 79   Temp 98.4 F (36.9 C) (Oral)   Resp 16   Ht '4\' 11"'  (1.499 m)   Wt 135 lb 6.4 oz (61.4 kg)   BMI 27.35 kg/m   Wt Readings from Last 3 Encounters:  02/15/17 135 lb 6.4 oz (61.4 kg)    Physical Exam  Constitutional: She is oriented to person, place, and time. She appears well-developed and well-nourished. No distress.  Elderly appearing 71 year female currently well, comfortable, cooperative  HENT:  Head: Normocephalic and atraumatic.  Mouth/Throat: Oropharynx is clear and moist.  Frontal / maxillary sinuses non-tender. Nares patent without purulence or edema.   Bilateral hearing aids in place  Oropharynx clear without erythema, exudates, edema or asymmetry.  Eyes: Conjunctivae and EOM are normal. Pupils are equal, round, and reactive to light. Right eye exhibits no discharge. Left eye exhibits no discharge.  Neck: Normal range of motion. Neck supple. No thyromegaly present.  Cardiovascular: Normal rate, regular rhythm, normal heart sounds and intact distal pulses.   No murmur heard. Pulmonary/Chest: Effort normal and breath sounds normal. No respiratory distress. She has no wheezes. She has no rales.  Good air movement. No focal lung abnormality.  Musculoskeletal: Normal range of motion. She exhibits no edema or tenderness.  Upper / Lower Extremities: - Normal muscle tone, strength bilateral upper extremities 5/5, lower extremities 5/5  - Bilateral knees with some mild crepitus on palpation, otherwise good range of motion, non tender.  - Normal Gait, able to stand from seated and get on and off exam table  without assistance  Lymphadenopathy:    She has no cervical adenopathy.  Neurological: She is alert and oriented to person, place, and time. No cranial nerve deficit. Coordination normal.  Skin: Skin is warm and dry. No rash noted. She is not diaphoretic. No erythema.  Psychiatric: She has a normal mood and affect. Her behavior is normal.  Well groomed, good eye contact, mostly normal speech sometimes delayed response or inaccurate answer with difficulty hearing, normal thoughts, appropriate recall  Nursing note and vitals reviewed.     Results for orders placed or performed during the hospital encounter of 02/08/15  CBC with Differential/Platelet  Result Value Ref Range   WBC 5.4 3.6 - 11.0 x10 3/mm 3   RBC 4.31 3.80 - 5.20 X10 6/mm 3   HGB 14.1 12.0 - 16.0 g/dL   HCT 42.3 35.0 - 47.0 %   MCV 98 80 - 100 fL   MCH 32.7 26.0 - 34.0 pg   MCHC 33.3 32.0 - 36.0 g/dL   RDW 15.1 (H) 11.5 - 14.5 %   Platelet 387 150 - 440 x10 3/mm 3   Neutrophil % 65.7 %   Lymphocyte % 14.3 %   Monocyte % 17.0 %   Eosinophil % 2.0 %   Basophil % 1.0 %   Neutrophil # 3.5 1.4 - 6.5 x10 3/mm 3   Lymphocyte # 0.8 (L) 1.0 - 3.6 x10 3/mm 3   Monocyte # 0.9 0.2 - 0.9 x10 3/mm    Eosinophil # 0.1 0.0 - 0.7 x10 3/mm 3   Basophil # 0.1 0.0 - 0.1 x10 3/mm 3  Comprehensive metabolic panel  Result Value Ref Range   Glucose, CSF DNP mg/dL   BUN 9 mg/dL   Creatinine 0.54 mg/dL   Sodium, Urine Random DNP mmol/L   Potassium, Urine Random DNP mmol/L   Chloride, Urine Random DNP mmol/L   Co2 28 mmol/L   Calcium, Total 9.2 mg/dL   SGOT(AST) 24 U/L   SGPT (ALT) 19 U/L   Alkaline Phosphatase 217 (H) U/L   Albumin 3.5 g/dL   Total Protein 6.6 g/dL   Bilirubin,Total 0.7 mg/dL   Anion Gap 9 7 - 16   EGFR (African American) >60    EGFR (Non-African Amer.) >60    Glucose 113 (H) mg/dL   Sodium 138 mmol/L   Potassium 3.7 mmol/L   Chloride 101 mmol/L  Urinalysis, Complete  Result Value Ref Range   Color - urine  Yellow    Clarity - urine Clear    Glucose,UR Negative 0 - 75 mg/dL   Bilirubin,UR Negative NEGATIVE   Ketone Trace NEGATIVE   Specific Gravity 1.010 1.003 - 1.030   Blood Negative NEGATIVE   Ph 5.0 4.5 - 8.0   Protein Negative NEGATIVE   Nitrite Negative NEGATIVE   Leukocyte Esterase Negative NEGATIVE   RBC,UR <1 /HPF 0 -  5 /HPF   WBC UR 1 /HPF 0 - 5 /HPF   Bacteria NONE SEEN NONE SEEN   Squamous Epithelial <1 /HPF    Mucous PRESENT    Hyaline Cast 2 /LPF   Surgical pathology  Result Value Ref Range   SURGICAL PATHOLOGY      Surgical Procedure CASE: [id number] PATIENT: Asal Olvey Surgical Pathology Report     SPECIMEN SUBMITTED: A. Bone and tissue, biopsy, L1  CLINICAL HISTORY: None provided  PRE-OPERATIVE DIAGNOSIS: L1 compression fracture  POST-OPERATIVE DIAGNOSIS: Same/ L1 kyphoplasty     DIAGNOSIS: VERTEBRAL BONE, L1; KYPHOPLASTY: - CHANGES CONSISTENT WITH COMPRESSION FRACTURE. - FOCAL TRILINEAR HEMATOPOIETIC MARROW WITH MATURATION. - NO ACTIVE INFLAMMATION OR MALIGNANCY SEEN.   GROSS DESCRIPTION:  A. Labeled: Bone and tissue biopsy L1 Tissue Fragment(s): Multiple Measurement: Aggregate, 0.9 x 0.5 x 0.1 cm Comment: Firm tan to brown tissue  Entirely submitted in cassette(s): 1 following decalcification   Final Diagnosis performed by Hassan Buckler, MD.  Electronically signed 02/15/2015 3:36:00PM    The electronic signature indicates that the named Attending Pathologist has evaluated the specimen  Technical component performed at [facility], [address] [place], [place] [address] Lab: [phone] Dir: Darrick Penna. Evette Doffing, MD  Professional component performed at [facility], [facility], [place], [place], [place] [address] Lab: [phone] Dir: Dellia Nims. Rubinas, MD        Assessment & Plan:   Problem List Items Addressed This Visit    Presbycusis of both ears    Stable chronic problem Improved on  bilateral hearing aids      Osteoarthritis of knees, bilateral    Stable, chronic bilateral genearlized Knee pain Known knee OA/DJD.  - Able to bear weight, no knee instability - No prior history of knee surgery, arthroscopy - Inadequate conservative therapy, using BC powder occasionally, no other meds  Plan: 1. Advised to stop [facility] regular use and instead start Tylenol 500-1032m per dose TID PRN or more regular dosing 2. Hold NSAIDs for now, prefer to avoid muscle relaxants or sedating medications 3. RICE therapy (rest, ice, compression, elevation) for swelling, activity modification 4. Consider future x-rays if needed for baseline 5. Follow-up as needed - [place] [facility] [place]      Hyperlipidemia    Refilled Simvastatin 242mdaily Awaiting outside records lipid results      Relevant Medications   simvastatin (ZOCOR) 20 MG tablet   History of compression fracture of spine    S/p kyphoplasty approx 2016, awaiting outside records, last DEXA reportedly 2017      GERD (gastroesophageal reflux disease)    Controlled on PPI No refill needed today Continue Omeprazole 2045maily      COPD (chronic obstructive pulmonary disease) ([facility]  Stable chronic problem, unclear exact diagnosis of COPD, never smoker but some 2nd hand smoke from husband. No record of PFTs / spirometry - On Advair maintenance, PRN Albuterol  Plan: 1. Refilled Advair 1 puff BID - sent 90 day supply to mail order 2. Follow-up in future if worsening exacerbations, consider formal PFTs      Relevant Medications   Fluticasone-Salmeterol (ADVAIR) 250-50 MCG/DOSE AEPB   Bipolar 1 disorder, depressed, full remission (HCC)    Stable, controlled on current regimen Chronic problem with multiple psychiatric diagnoses including bipolar depression without history of mania by report, and also some anxiety Not managed by Psychiatry before. Previously on variety of meds per PCP Dr  NieBrunetta Genera  Plan: 1. Given patient's age, complexity of diagnoses, and multiple drug regimen, without significant supporting evidence in history or prior records, discussion today that I prefer patient to be evaluated by Psychiatry to ensure proper management and follow-up 2. Referral to [facility] (Psychiatry) for further management and anticipate they will resume prescribing of her Psychiatric meds (Olanzapine, Venlafaxine, Buproprion, Buspirone - or adjust this regimen as appropriate) 3. I have provided another temporary refill of her Buspirone since about to run out in 2 days, [facility], in future rx requested to go to [facility] mail order, she was already on daily dose not BID, so this was continued 4. Follow-up as needed      Relevant Medications   busPIRone (BUSPAR) 10 MG tablet   Other Relevant Orders   Ambulatory referral to Psychiatry   Anxiety    See A&P Bipolar Depression Refilled Buspirone      Relevant Medications   busPIRone (BUSPAR) 10 MG tablet   Other Relevant Orders   Ambulatory referral to Psychiatry    Other Visit Diagnoses    Encounter to establish care with new doctor    -  Primary      Meds ordered this encounter  Medications  . DISCONTD: Fluticasone-Salmeterol (ADVAIR) 250-50 MCG/DOSE AEPB    Sig: Inhale 1 puff into the lungs 2 (two) times daily.  . simvastatin (ZOCOR) 20 MG tablet    Sig: Take 1 tablet (20 mg total) by mouth every evening.    Dispense:  90 tablet    Refill:  3  . busPIRone (BUSPAR) 10 MG tablet    Sig: Take 1 tablet (10 mg total) by mouth daily.    Dispense:  30 tablet    Refill:  1  . Fluticasone-Salmeterol (ADVAIR) 250-50 MCG/DOSE AEPB    Sig: Inhale 1 puff into the lungs 2 (two) times daily.    Dispense:  90 each    Refill:  3    Follow up plan: Return in about 3 months (around 05/17/2017) for Annual physical.  ANobie Putnam DO [facility] 02/15/2017, 5:14 PM

## 2017-02-15 NOTE — Assessment & Plan Note (Signed)
Stable, controlled on current regimen Chronic problem with multiple psychiatric diagnoses including bipolar depression without history of mania by report, and also some anxiety Not managed by Psychiatry before. Previously on variety of meds per PCP Dr Brunetta Genera  Plan: 1. Given patient's age, complexity of diagnoses, and multiple drug regimen, without significant supporting evidence in history or prior records, discussion today that I prefer patient to be evaluated by Psychiatry to ensure proper management and follow-up 2. Referral to [facility] (Psychiatry) for further management and anticipate they will resume prescribing of her Psychiatric meds (Olanzapine, Venlafaxine, Buproprion, Buspirone - or adjust this regimen as appropriate) 3. I have provided another temporary refill of her Buspirone since about to run out in 2 days, [facility] day supply 10mg  [facility], in future rx requested to go to [facility] mail order, she was already on daily dose not BID, so this was continued 4. Follow-up as needed

## 2017-02-15 NOTE — Addendum Note (Signed)
Addended by: Olin Hauser on: 02/15/2017 05:16 PM   Modules accepted: Orders

## 2017-02-15 NOTE — Assessment & Plan Note (Signed)
See A&P Bipolar Depression Refilled Buspirone

## 2017-02-15 NOTE — Assessment & Plan Note (Signed)
Refilled Simvastatin 20mg  daily Awaiting outside records lipid results

## 2017-02-15 NOTE — Assessment & Plan Note (Signed)
S/p kyphoplasty approx 2016, awaiting outside records, last DEXA reportedly 2017

## 2017-02-15 NOTE — Assessment & Plan Note (Signed)
Controlled on PPI No refill needed today Continue Omeprazole 20mg  daily

## 2017-02-15 NOTE — Assessment & Plan Note (Signed)
Stable chronic problem Improved on bilateral hearing aids

## 2017-02-15 NOTE — Patient Instructions (Addendum)
Thank you for coming in to clinic today.  1.  For Bipolar Depression, and your medications I do recommend that you establish with Psychiatry to continue these treatments, as I do not routinely prescribe all of these in combination.  Referral sent to you should receive a phone call within few weeks with an appointment if you don't hear back by 2 weeks then go ahead and give them a call to schedule new appointment.  [facility] [address] [place], [id number] Phone: [phone] (Option 1) [email or web address]  Dr Carlos Levering Su  Request all psych meds for 90 day supply to be sent through [facility] once you establish with Psychiatry - Other Psych meds - you have existing refills already - Olanzapine 5mg  daily - Buprioprion 150mg  XL daily - Venlfaxine 75mg  24 hour tab daily  2. I have refilled Buspirone 10mg  daily for [facility] - please request this refill from your Psychiatry when you establish, and they can send [facility]  3. When ready for Omeprazole (stomach acid refill) - call our office  4. Refilled Advair inhaler for everyday use for now, in future we can schedule a breathing test  5. Refilled Simvastatin cholesterol med - to [facility]  For Albuterol (RESCUE INHALER) Only use if needed shortness of breath or wheezing  You will be due for FASTING BLOOD WORK (no food or drink after midnight before, only water or coffee without cream/sugar on the morning of)  - Please go ahead and schedule a "Lab Only" visit in the morning at the clinic for lab draw in 3 months before next Annual Physical - Make sure Lab Only appointment is at least 1-2 weeks before your next appointment, so that results will be available  Please schedule a follow-up appointment with Dr. Parks Ranger in 3 months for Annual Physical  If you have any other questions or concerns, please feel free to call the clinic or send a message through [place].  You may also schedule an earlier appointment if necessary.  [name], DO [place]

## 2017-02-15 NOTE — Assessment & Plan Note (Addendum)
Stable chronic problem, unclear exact diagnosis of COPD, never smoker but some 2nd hand smoke from husband. No record of PFTs / spirometry - On Advair maintenance, PRN Albuterol  Plan: 1. Refilled Advair 1 puff BID - sent 90 day supply to mail order 2. Follow-up in future if worsening exacerbations, consider formal PFTs

## 2017-04-03 ENCOUNTER — Other Ambulatory Visit: Payer: Self-pay | Admitting: Family Medicine

## 2017-04-03 ENCOUNTER — Telehealth: Payer: Self-pay | Admitting: Family Medicine

## 2017-04-03 NOTE — Telephone Encounter (Signed)
Pt.called states that she have been out of medicine for a month was requesting a refill on  Bupropion  150, Buspirone 10 mg . Pt have called twice this morning.

## 2017-04-03 NOTE — Telephone Encounter (Signed)
Will follow-up record from Psychiatry. Do not plan to refill these meds today when her apt is tomorrow morning. Unfortunately patient should have contacted [place] several weeks to month ago to notify us she was running out.  Now Psychiatry should be prescribing these medicines or whichever therapy options they decide are indicated for her on a more long-term basis to avoid her running out of meds.  Pamela Ball, [facility] 04/03/2017, 5:25 PM

## 2017-04-03 NOTE — Telephone Encounter (Signed)
[place] care pt has appointment on 04/04/2017 at 8:20 am.

## 2017-04-03 NOTE — Telephone Encounter (Signed)
Patient was seen as new patient on 02/15/17. She was on complex psychiatric medication regimen prior to initiating care with me, by her prior provider. She was given temporary rx of Buspar since she had run out at that time. See note for full details. She was referred to Psychiatry [facility], Dr Myer Haff) for further management of all her psych medications for Bipolar Affective Disorder with depression. I advised her and her spouse that I did not plan to primarily prescribe all of her medications for this problem and that I preferred if the Psychiatrist would prescribe these.  I have not receive any records from Psychiatry. I am unsure if patient has established with them yet or not.  She should have notified us much earlier if there was a problem with referral or if she was going to run out of meds, instead of waiting 1 month without medications. She should not be going through withdrawal symptoms from these two medications, especially 1 month later, probably her symptoms are simply uncontrolled.  Please check into referral status with [facility] first to see if patient is scheduled or what the problem is, and then check with patient. If she has an upcoming apt or can get one scheduled, then I can refill these two medications for another temporary 1 month supply to get her to that apt.  [name], DO [facility] 04/03/2017, 12:31 PM

## 2017-04-04 DIAGNOSIS — Z79899 Other long term (current) drug therapy: Secondary | ICD-10-CM | POA: Diagnosis not present

## 2017-04-11 ENCOUNTER — Other Ambulatory Visit: Payer: Self-pay | Admitting: Family Medicine

## 2017-04-11 DIAGNOSIS — F419 Anxiety disorder, unspecified: Secondary | ICD-10-CM

## 2017-04-11 NOTE — Telephone Encounter (Signed)
Declined refill. See detailed telephone note from 04/03/17. She is followed now by Psychiatry, and this should be filled by her Psychiatrist.  [name], [facility] 04/11/2017, 12:35 PM

## 2017-05-16 DIAGNOSIS — H353132 Nonexudative age-related macular degeneration, bilateral, intermediate dry stage: Secondary | ICD-10-CM | POA: Diagnosis not present

## 2017-05-21 ENCOUNTER — Encounter: Payer: Self-pay | Admitting: Family Medicine

## 2017-05-21 ENCOUNTER — Ambulatory Visit (INDEPENDENT_AMBULATORY_CARE_PROVIDER_SITE_OTHER): Payer: Medicare Other | Admitting: Family Medicine

## 2017-05-21 VITALS — BP 139/65 | HR 75 | Temp 98.6°F | Resp 16 | Ht 59.0 in | Wt 134.0 lb

## 2017-05-21 DIAGNOSIS — Z8639 Personal history of other endocrine, nutritional and metabolic disease: Secondary | ICD-10-CM

## 2017-05-21 DIAGNOSIS — R7309 Other abnormal glucose: Secondary | ICD-10-CM

## 2017-05-21 DIAGNOSIS — K219 Gastro-esophageal reflux disease without esophagitis: Secondary | ICD-10-CM

## 2017-05-21 DIAGNOSIS — F3176 Bipolar disorder, in full remission, most recent episode depressed: Secondary | ICD-10-CM | POA: Diagnosis not present

## 2017-05-21 DIAGNOSIS — H9113 Presbycusis, bilateral: Secondary | ICD-10-CM | POA: Diagnosis not present

## 2017-05-21 DIAGNOSIS — Z1159 Encounter for screening for other viral diseases: Secondary | ICD-10-CM | POA: Diagnosis not present

## 2017-05-21 DIAGNOSIS — Z8719 Personal history of other diseases of the digestive system: Secondary | ICD-10-CM

## 2017-05-21 DIAGNOSIS — F419 Anxiety disorder, unspecified: Secondary | ICD-10-CM

## 2017-05-21 DIAGNOSIS — Z23 Encounter for immunization: Secondary | ICD-10-CM | POA: Diagnosis not present

## 2017-05-21 DIAGNOSIS — E782 Mixed hyperlipidemia: Secondary | ICD-10-CM | POA: Diagnosis not present

## 2017-05-21 DIAGNOSIS — Z79899 Other long term (current) drug therapy: Secondary | ICD-10-CM

## 2017-05-21 DIAGNOSIS — J432 Centrilobular emphysema: Secondary | ICD-10-CM | POA: Diagnosis not present

## 2017-05-21 HISTORY — DX: Personal history of other diseases of the digestive system: Z87.19

## 2017-05-21 LAB — CBC WITH DIFFERENTIAL/PLATELET
BASOS ABS: 40 {cells}/uL (ref 0–200)
BASOS PCT: 1 %
EOS PCT: 4 %
Eosinophils Absolute: 160 cells/uL (ref 15–500)
HCT: 34.7 % — ABNORMAL LOW (ref 35.0–45.0)
HEMOGLOBIN: 9.8 g/dL — AB (ref 11.7–15.5)
LYMPHS ABS: 1040 {cells}/uL (ref 850–3900)
Lymphocytes Relative: 26 %
MCH: 23.6 pg — AB (ref 27.0–33.0)
MCHC: 28.2 g/dL — ABNORMAL LOW (ref 32.0–36.0)
MCV: 83.4 fL (ref 80.0–100.0)
MONOS PCT: 8 %
MPV: 8.8 fL (ref 7.5–12.5)
Monocytes Absolute: 320 cells/uL (ref 200–950)
NEUTROS ABS: 2440 {cells}/uL (ref 1500–7800)
Neutrophils Relative %: 61 %
PLATELETS: 345 10*3/uL (ref 140–400)
RBC: 4.16 MIL/uL (ref 3.80–5.10)
RDW: 18.2 % — ABNORMAL HIGH (ref 11.0–15.0)
WBC: 4 10*3/uL (ref 3.8–10.8)

## 2017-05-21 LAB — COMPLETE METABOLIC PANEL WITH GFR
ALBUMIN: 3.9 g/dL (ref 3.6–5.1)
ALK PHOS: 95 U/L (ref 33–130)
ALT: 10 U/L (ref 6–29)
AST: 20 U/L (ref 10–35)
BILIRUBIN TOTAL: 0.2 mg/dL (ref 0.2–1.2)
BUN: 12 mg/dL (ref 7–25)
CO2: 20 mmol/L (ref 20–31)
Calcium: 8.5 mg/dL — ABNORMAL LOW (ref 8.6–10.4)
Chloride: 105 mmol/L (ref 98–110)
Creat: 0.79 mg/dL (ref 0.60–0.93)
GFR, EST NON AFRICAN AMERICAN: 76 mL/min (ref >=60)
GFR, Est African American: 87 mL/min (ref >=60)
Glucose, Bld: 78 mg/dL (ref 65–99)
POTASSIUM: 4.2 mmol/L (ref 3.5–5.3)
Sodium: 139 mmol/L (ref 135–146)
Total Protein: 6.3 g/dL (ref 6.1–8.1)

## 2017-05-21 LAB — LIPID PANEL
CHOLESTEROL: 197 mg/dL (ref <200)
HDL: 60 mg/dL (ref >50)
LDL CALC: 118 mg/dL — AB (ref <100)
TRIGLYCERIDES: 95 mg/dL (ref <150)
Total CHOL/HDL Ratio: 3.3 Ratio (ref <5.0)
VLDL: 19 mg/dL (ref <30)

## 2017-05-21 LAB — T4, FREE: FREE T4: 0.7 ng/dL — AB (ref 0.8–1.8)

## 2017-05-21 LAB — TSH: TSH: 1.42 m[IU]/L

## 2017-05-21 MED ORDER — FLUTICASONE-SALMETEROL 250-50 MCG/DOSE IN AEPB: 1.0000 | INHALATION_SPRAY | Freq: Two times a day (BID) | RESPIRATORY_TRACT | 5 refills | Status: DC

## 2017-05-21 NOTE — Assessment & Plan Note (Signed)
Stable, chronic problem improved with bilateral hearing aids, currently conversational hearing was appropriate, however currently hearing aids not functioning Follow-up with ENT as needed

## 2017-05-21 NOTE — Assessment & Plan Note (Signed)
Stable without evidence of abdominal herniation on exam. Clinically asymptomatic. Discussed warning signs and return criteria if pain or not reducible Follow-up PRN, no imaging at this time, unlikely to assist with diagnosis or management

## 2017-05-21 NOTE — Assessment & Plan Note (Signed)
Stable, on Buspirone, see A&P for Bipolar Followed by Psychiatry now, Dr Su ([facility]) 

## 2017-05-21 NOTE — Assessment & Plan Note (Signed)
Stable, controlled on current regimen, chronic problem multiple psych dx, primarily bipolar depression with mixed anxiety. Followed by Psychiatry now, Dr Kasandra Knudsen [facility]), currently receiving rx and managing  Plan: 1. Follow-up as scheduled with Psychiatry, next apt 05/30/17, no changes to meds - continue Olanzapine 5mg  daily, Venlafaxine ER 75mg  daily, Buproprion XL 150mg  daily, Buspirone 10mg  BID 2. Mentioned again to patient today that I do not plan to manage these meds, and if refills needed should be requested from Psychiatry Dr Su 3. Follow-up as needed

## 2017-05-21 NOTE — Progress Notes (Signed)
Subjective:    Patient ID: Pamela Ball, female    DOB: 07-Sep-1946, 71 y.o.   MRN: [id number]  Pamela Ball is a 71 y.o. female presenting on 05/21/2017 for Gastroesophageal Reflux (3 month follow up)  HPI   Bipolar Depression, Controlled in Remission / Anxiety - Reports a chronic history of Bipolar Depression diagnosis about 2-3 years ago (previously undiagnosed), last visit with me as new patient in 02/2017. She was referred to Dr Myer Haff, [facility] for further management of Bipolar Disorder and mental health. She was last seen on 04/04/17 by them, however we have not received the fax of record - Currently she has all medications, brought bottles today, taking Olanzapine 24m daily, Venlafaxine ER 763mdaily, Buproprion XL 15074maily, Buspirone 35m43mD. She is tolerating meds well and currently stable on this regimen, without recent change. 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 However her vision does limit her significantly, cannot drive and has difficulty with some tasks at home. [facility] has had prior laser eye surgery still difficulty with vision, wears corrective lenses - Now reports problem with hearing aid recently  GERD Chronic problem without recent concerns. Controlled on Omeprazole 20mg29mly. Not had prior PUD or EGD.  Thyroid Goiter: - Reports prior diagnosis of Thyroid Goiter, was followed with lab tests and neck ultrasound, was told it was stable in past without change or problem. No history of thyroid nodule. She is asking about checking on thyroid today.  History of Hernia, Right  abdomen - Reports prior problem with hernia of right abdomen, she is unsure how long this has been a problem. Reports previously diagnosed, and she has felt a "knot" before in abdomen, but not always. Denies any bulging or distended herniation, never required to manually reduce. - She has not had prior imaging. Not evaluated by Surgery.  COPD: - Previously stable on Advair 1 puff BID, has been out, despite rx refilled [facility] at last visit in 02/2017, she states never received this med, requesting it sent to local pharmacy. She admits problem with occasional coughing and some congestion without significant sputum production, also infrequent shortness of breath, but she is able to catch her breath, and overall still feels pretty good, denies exacerbation. - Has Albuterol rescue inhaler, using only x 1 monthly, not increased recently - She is never smoker. Husband smokes outside, so she gets second hand smoke, chronically - Never seen Pulmonology - Admtis stable unchanged dyspnea with moderate walking or exercise - Denies chest pain, dyspnea at rest, hemoptysis, wheezing, waking up at night  ABNORMAL GLUCOSE / HYPERLIPIDEMIA: - History of abnormal glucose in past with >100 cbg. No formal diagnosis of pre-DM or DM. She is on 2nd generation anti-psychotic for bipolar increasing risk. - Prior history of Hyperlipidemia, without available lab results for comparison - Due for routine blood work today, fasting, except small amount of gatorade with meds - Currently taking Simvastatin 20mg,16merating well without side effects or myalgias Lifestyle - Diet: general balanced diet, not following any specific diet, drinks 3 bottles of water daily - Exercise: no regular exercise, active around house but no  regular walking  Health maintenance: - Still waiting on outside records for prior Pap Smear, Mammogram and Bone Density testing in 2017 - Due for Pneumonia vaccine, has not had one dose before,  would start Prevnar-13, will get dose today - Due for Hepatitis C screening test, will draw today with labs - Due for TDap, will defer since already receiving one vaccine today  Depression screen [facility] 2/9 05/21/2017 02/15/2017  Decreased Interest 1 0  Down, Depressed, Hopeless 1 0  PHQ - 2 Score 2 0  Altered sleeping 3 0  Tired, decreased energy 0 3  Change in appetite 1 0  Feeling bad or failure about yourself  1 0  Trouble concentrating 1 1  Moving slowly or fidgety/restless 0 1  Suicidal thoughts 0 0  PHQ-9 Score 8 5  Difficult doing work/chores - Not difficult at all    Past Medical History:  Diagnosis Date  . Bipolar affective disorder ([place])   . Depression   . Glaucoma   . Hyperlipidemia    Past Surgical History:  Procedure Laterality Date  . ABDOMINAL HYSTERECTOMY  11/06/2006  . KYPHOPLASTY      Family History  Problem Relation Age of Onset  . Cancer Mother        cancer     Review of Systems   Per HPI unless specifically indicated above     Objective:    BP 139/65   Pulse 75   Temp 98.6 F (37 C) (Oral)   Resp 16   Ht _0  (1.499 m)   Wt 134 lb (60.8 kg)   BMI 27.06 kg/m   Wt Readings from Last 3 Encounters:  05/21/17 134 lb (60.8 kg)  02/15/17 135 lb 6.4 oz (61.4 kg)    Physical Exam  Constitutional: She is oriented to person, place, and time. She appears well-developed and well-nourished. No distress.  Elderly appearing 71 year female currently well, comfortable, cooperative  HENT:  Head: Normocephalic and atraumatic.  Mouth/Throat: Oropharynx is clear and moist.  Currently does not have hearing aids in place, conversational hearing is appropriate  Oropharynx clear without erythema, exudates, edema or asymmetry.  Eyes: Conjunctivae are normal. Right eye exhibits no discharge. Left eye exhibits no discharge.  Neck: Normal range of motion. Neck supple. No thyromegaly (No real appreciable thyroid goiter, some minimal enlarged tissue of neck  making exam difficult, no palpable nodules) present.  Cardiovascular: Normal rate, regular rhythm, normal heart sounds and intact distal pulses.   No murmur heard. Pulmonary/Chest: Effort normal and breath sounds normal. No respiratory distress. She has no wheezes. She has no rales.  Good air movement. No focal lung abnormality. Speaks full sentences.  Abdominal: Soft. Bowel sounds are normal. She exhibits no distension and no mass. There is no tenderness.  Non tender RLQ or R mid abdomen. No palpable reproducible herniation of abdominal wall on inc abdomen pressure and valsalva.  Musculoskeletal: Normal range of motion. She exhibits no edema.  Lymphadenopathy:    She has no cervical adenopathy.  Neurological: She is alert and oriented to person, place, and time.  Skin: Skin is warm and dry. No rash noted. She is not diaphoretic. No erythema.  Psychiatric: She has a normal mood and affect. Her behavior is normal.  Well groomed, good eye contact. Appropriate speech with some hearing difficulty, but normal thoughts, appropriate insight into condition  Nursing note and vitals reviewed.    Results for orders placed or performed during the hospital encounter of 02/08/15  CBC with Differential/Platelet  Result Value Ref Range   WBC 5.4 3.6 - 11.0 x10 3/mm 3   RBC 4.31 3.80 - 5.20 X10 6/mm 3   HGB 14.1 12.0 - 16.0 g/dL   HCT 42.3 35.0 - 47.0 %   MCV 98 80 - 100 fL   MCH 32.7 26.0 - 34.0 pg   MCHC 33.3 32.0 - 36.0 g/dL   RDW 15.1 (H) 11.5 - 14.5 %   Platelet 387 150 - 440 x10 3/mm 3   Neutrophil % 65.7 %   Lymphocyte % 14.3 %   Monocyte % 17.0 %   Eosinophil % 2.0 %   Basophil % 1.0 %   Neutrophil # 3.5 1.4 - 6.5 x10 3/mm 3   Lymphocyte # 0.8 (L) 1.0 - 3.6 x10 3/mm 3   Monocyte # 0.9 0.2 - 0.9 x10 3/mm    Eosinophil # 0.1 0.0 - 0.7 x10 3/mm 3   Basophil # 0.1 0.0 - 0.1 x10 3/mm 3  Comprehensive metabolic panel  Result Value Ref Range   Glucose, CSF DNP mg/dL   BUN 9 mg/dL    Creatinine 0.54 mg/dL   Sodium, Urine Random DNP mmol/L   Potassium, Urine Random DNP mmol/L   Chloride, Urine Random DNP mmol/L   Co2 28 mmol/L   Calcium, Total 9.2 mg/dL   SGOT(AST) 24 U/L   SGPT (ALT) 19 U/L   Alkaline Phosphatase 217 (H) U/L   Albumin 3.5 g/dL   Total Protein 6.6 g/dL   Bilirubin,Total 0.7 mg/dL   Anion Gap 9 7 - 16   EGFR (African American) >60    EGFR (Non-African Amer.) >60    Glucose 113 (H) mg/dL   Sodium 138 mmol/L   Potassium 3.7 mmol/L   Chloride 101 mmol/L  Urinalysis, Complete  Result Value Ref Range   Color - urine Yellow    Clarity - urine Clear    Glucose,UR Negative 0 - 75 mg/dL   Bilirubin,UR Negative NEGATIVE   Ketone Trace NEGATIVE   Specific Gravity 1.010 1.003 - 1.030   Blood Negative NEGATIVE   Ph 5.0 4.5 - 8.0   Protein Negative NEGATIVE   Nitrite Negative NEGATIVE   Leukocyte Esterase Negative NEGATIVE   RBC,UR <1 /HPF 0 - 5 /HPF   WBC UR 1 /HPF 0 - 5 /HPF   Bacteria NONE SEEN NONE SEEN   Squamous Epithelial <1 /HPF    Mucous PRESENT    Hyaline Cast 2 /LPF   Surgical pathology  Result Value Ref Range   SURGICAL PATHOLOGY      Surgical Procedure CASE: [id number] PATIENT: Yerlin Klunder Surgical Pathology Report     SPECIMEN SUBMITTED: A. Bone and tissue, biopsy, L1  CLINICAL HISTORY: None provided  PRE-OPERATIVE DIAGNOSIS: L1 compression fracture  POST-OPERATIVE DIAGNOSIS: Same/ L1 kyphoplasty     DIAGNOSIS: VERTEBRAL BONE, L1; KYPHOPLASTY: - CHANGES CONSISTENT WITH COMPRESSION FRACTURE. - FOCAL TRILINEAR HEMATOPOIETIC MARROW WITH MATURATION. - NO ACTIVE INFLAMMATION OR MALIGNANCY SEEN.   GROSS DESCRIPTION:  A. Labeled: Bone and tissue biopsy L1 Tissue Fragment(s): Multiple Measurement: Aggregate, 0.9 x 0.5 x 0.1 cm Comment: Firm tan to brown tissue  Entirely submitted in cassette(s): 1 following decalcification   Final Diagnosis performed by Hassan Buckler, MD.  Electronically  signed 02/15/2015 3:36:00PM    The electronic signature indicates that the named Attending Pathologist has evaluated the specimen  Technical component performed at [facility], [address], [place], [place] [address] Lab: [phone] Dir: Darrick Penna.  Evette Doffing, MD  Professional component performed at [facility], [facility], [place], [place],  [address] Lab: [phone] Dir: Dellia Nims. Rubinas, MD        Assessment & Plan:   Problem List Items Addressed This Visit    Presbycusis of both ears    Stable, chronic problem improved with bilateral hearing aids, currently conversational hearing was appropriate, however currently hearing aids not functioning Follow-up with ENT as needed      Hyperlipidemia    Awaiting prior lipids, limited lifestyle improvements Due for repeat blood work, mostly fasting today, did have small amount of gatorade this AM with meds  Plan: 1. Continue current meds - Simvastatin 64m daily 2. Consider ASA 828mfor primary ASCVD risk reduction 3. Encourage improved lifestyle - low carb/cholesterol, reduce portion size, start regular exercise 4. Follow-up 3-6 months      Relevant Orders   Lipid panel   TSH   T4, free   History of abdominal hernia    Stable without evidence of abdominal herniation on exam. Clinically asymptomatic. Discussed warning signs and return criteria if pain or not reducible Follow-up PRN, no imaging at this time, unlikely to assist with diagnosis or management      GERD (gastroesophageal reflux disease)    Stable, continue Omeprazole 2039maily      COPD (chronic obstructive pulmonary disease) (HCC) - Primary    Stable chronic COPD, second hand smoke, never smoker No prior PFTs/spirometry, pulmonology - On Advair maintenance, Albuterol rare PRN use  Plan: 1. Refilled Advair again 1 puff BID - sent to local pharmacy this time, previously refilled [facility] in 02/2017 but unsure why never  received. Has albuterol but only PRN use 2. Follow-up as needed      Relevant Medications   Fluticasone-Salmeterol (ADVAIR) 250-50 MCG/DOSE AEPB   Bipolar 1 disorder, depressed, full remission (HCC)    Stable, controlled on current regimen, chronic problem multiple psych dx, primarily bipolar depression with mixed anxiety. [facility]ry now[facility] and [facility]  Plan: 1. Follow-up as scheduled with Psychiatry, next apt 05/30/17, no changes to meds - continue Olanzapine 5mg51mily, Venlafaxine ER 75mg12mly, Buproprion XL 150mg 70my, Buspirone 10mg B72m. Mentioned again to patient today that I do not plan to manage these meds, and if refills needed should be requested from Psychiatry Dr Su 3. Follow-up as needed      Relevant Orders   COMPLETE METABOLIC PANEL WITH GFR   Anxiety    Stable, on Buspirone, see A&P for Bipolar Followed by Psychiatry now, [facility] Su [facility]   COMPLETE METABOLIC PANEL WITH GFR    Other Visit Diagnoses    Abnormal glucose       Prior elevated glucose without documented A1c, and on high risk med 2nd gen anti-psychotic   Relevant Orders   Hemoglobin A1c   Need for vaccination with 13-polyvalent pneumococcal conjugate vaccine       Due for 1st pneumonia vaccine, will be due for booster Pneumovax-23 in 1 year, 05/2018   Relevant Orders   Pneumococcal conjugate vaccine 13-valent IM (Completed)   Need for hepatitis C screening test       Due for routine screening   Relevant Orders   Hepatitis C antibody   Long-term use of high-risk medication       2nd gen anti-psychotic high risk med for progression to diabetes,  and abnormal electrolytes, CBC   Relevant Orders   COMPLETE METABOLIC PANEL WITH GFR   Lipid panel   CBC with Differential/Platelet   History of goiter       Relevant Orders   TSH   T4, free      Meds ordered this encounter  Medications  .  Fluticasone-Salmeterol (ADVAIR) 250-50 MCG/DOSE AEPB    Sig: Inhale 1 puff into the lungs 2 (two) times daily.    Dispense:  60 each    Refill:  5    Follow up plan: Return in about 3 months (around 08/21/2017) for COPD, Arthritis.  [name], [facility] 05/21/2017, 9:49 AM

## 2017-05-21 NOTE — Assessment & Plan Note (Signed)
Awaiting prior lipids, limited lifestyle improvements Due for repeat blood work, mostly fasting today, did have small amount of gatorade this AM with meds  Plan: 1. Continue current meds - Simvastatin 20mg  daily 2. Consider ASA 81mg  for primary ASCVD risk reduction 3. Encourage improved lifestyle - low carb/cholesterol, reduce portion size, start regular exercise 4. Follow-up 3-6 months

## 2017-05-21 NOTE — Patient Instructions (Addendum)
Thank you for coming to the clinic today.  1. Continue all medicines, no changes today  2. Refilled Advair inhaler for COPD  3 Prevnar 13 pneumonia vaccine today - then due for next one booster in 1 year Pneumovax-23  4. You will be due for FASTING BLOOD WORK (no food or drink after midnight before, only water or coffee without cream/sugar on the morning of)  TODAY - will call with results  I do not have a concern about the right abdominal hernia, we will watch this for now, if it gets worse, or causes pain and comes out and does not go back in, then we need to re-evaluate or need to seek more immediate treatment.   Please schedule a Follow-up Appointment to: Return in about 3 months (around 08/21/2017) for COPD, Arthritis.  If you have any other questions or concerns, please feel free to call the clinic or send a message through [facility]. You may also schedule an earlier appointment if necessary.  Additionally, you may be receiving a survey about your experience at our clinic within a few days to 1 week by e-mail or mail. We value your feedback.  [name], DO [facility]

## 2017-05-21 NOTE — Assessment & Plan Note (Signed)
Stable, continue Omeprazole 20mg daily.  

## 2017-05-21 NOTE — Assessment & Plan Note (Signed)
Stable chronic COPD, second hand smoke, never smoker No prior PFTs/spirometry, pulmonology - On Advair maintenance, Albuterol rare PRN use  Plan: 1. Refilled Advair again 1 puff BID - sent to local pharmacy this time, previously refilled [facility] in 02/2017 but unsure why never received. Has albuterol but only PRN use 2. Follow-up as needed

## 2017-05-22 LAB — HEPATITIS C ANTIBODY: HCV AB: NEGATIVE

## 2017-05-22 LAB — HEMOGLOBIN A1C
Hgb A1c MFr Bld: 5.2 % (ref <5.7)
Mean Plasma Glucose: 103 mg/dL

## 2017-05-23 ENCOUNTER — Encounter: Payer: Self-pay | Admitting: Family Medicine

## 2017-05-23 DIAGNOSIS — D649 Anemia, unspecified: Secondary | ICD-10-CM

## 2017-05-23 HISTORY — DX: Anemia, unspecified: D64.9

## 2017-05-31 ENCOUNTER — Inpatient Hospital Stay
Admit: 2017-05-31 | Discharge: 2017-05-31 | Disposition: A | Discharge status: Home / Self Care | Payer: MEDICARE | Attending: Emergency Medicine

## 2017-05-31 DIAGNOSIS — N390 Urinary tract infection, site not specified: Secondary | ICD-10-CM | POA: Diagnosis not present

## 2017-05-31 DIAGNOSIS — Z9049 Acquired absence of other specified parts of digestive tract: Secondary | ICD-10-CM | POA: Diagnosis not present

## 2017-05-31 DIAGNOSIS — Z713 Dietary counseling and surveillance: Secondary | ICD-10-CM | POA: Diagnosis not present

## 2017-05-31 DIAGNOSIS — R112 Nausea with vomiting, unspecified: Secondary | ICD-10-CM | POA: Diagnosis not present

## 2017-05-31 LAB — CBC WITH AUTOMATED DIFF
ABS. BASOPHILS: 0 10*3/uL (ref 0.0–0.1)
ABS. EOSINOPHILS: 0 10*3/uL (ref 0.0–0.4)
ABS. LYMPHOCYTES: 0.8 10*3/uL — ABNORMAL LOW (ref 0.9–3.6)
ABS. MONOCYTES: 1 10*3/uL (ref 0.05–1.2)
ABS. NEUTROPHILS: 7.5 10*3/uL (ref 1.8–8.0)
BASOPHILS: 0 % (ref 0–2)
EOSINOPHILS: 0 % (ref 0–5)
HCT: 37.6 % (ref 35.0–45.0)
HGB: 11.2 g/dL — ABNORMAL LOW (ref 12.0–16.0)
LYMPHOCYTES: 8 % — ABNORMAL LOW (ref 21–52)
MCH: 24 PG (ref 24.0–34.0)
MCHC: 29.8 g/dL — ABNORMAL LOW (ref 31.0–37.0)
MCV: 80.7 FL (ref 74.0–97.0)
MONOCYTES: 11 % — ABNORMAL HIGH (ref 3–10)
MPV: 9.7 FL (ref 9.2–11.8)
NEUTROPHILS: 81 % — ABNORMAL HIGH (ref 40–73)
PLATELET: 434 10*3/uL — ABNORMAL HIGH (ref 135–420)
RBC: 4.66 M/uL (ref 4.20–5.30)
RDW: 18.5 % — ABNORMAL HIGH (ref 11.6–14.5)
WBC: 9.3 10*3/uL (ref 4.6–13.2)

## 2017-05-31 LAB — METABOLIC PANEL, COMPREHENSIVE
A-G Ratio: 1.1 (ref 0.8–1.7)
ALT (SGPT): 17 U/L (ref 13–56)
AST (SGOT): 20 U/L (ref 15–37)
Albumin: 4.1 g/dL (ref 3.4–5.0)
Alk. phosphatase: 120 U/L — ABNORMAL HIGH (ref 45–117)
Anion gap: 11 mmol/L (ref 3.0–18)
BUN/Creatinine ratio: 21 — ABNORMAL HIGH (ref 12–20)
BUN: 13 MG/DL (ref 7.0–18)
Bilirubin, total: 0.5 MG/DL (ref 0.2–1.0)
CO2: 25 mmol/L (ref 21–32)
Calcium: 9.8 MG/DL (ref 8.5–10.1)
Chloride: 104 mmol/L (ref 100–108)
Creatinine: 0.63 MG/DL (ref 0.6–1.3)
GFR est AA: >60 mL/min/{1.73_m2} (ref >60)
GFR est non-AA: >60 mL/min/{1.73_m2} (ref >60)
Globulin: 3.6 g/dL (ref 2.0–4.0)
Glucose: 161 mg/dL — ABNORMAL HIGH (ref 74–99)
Potassium: 4.5 mmol/L (ref 3.5–5.5)
Protein, total: 7.7 g/dL (ref 6.4–8.2)
Sodium: 140 mmol/L (ref 136–145)

## 2017-05-31 LAB — URINALYSIS W/ RFLX MICROSCOPIC
Bilirubin: NEGATIVE
Blood: NEGATIVE
Glucose: NEGATIVE mg/dL
Ketone: 15 mg/dL — AB
Nitrites: NEGATIVE
Protein: NEGATIVE mg/dL
Specific gravity: 1.016 (ref 1.005–1.030)
Urobilinogen: 1 EU/dL (ref 0.2–1.0)
pH (UA): 5.5 (ref 5.0–8.0)

## 2017-05-31 LAB — URINE MICROSCOPIC ONLY
RBC: 0 /hpf (ref 0–5)
WBC: 4 /hpf (ref 0–4)

## 2017-05-31 LAB — LIPASE: Lipase: 76 U/L (ref 73–393)

## 2017-05-31 MED ORDER — ONDANSETRON (PF) 4 MG/2 ML INJECTION
4 mg/2 mL | INTRAMUSCULAR | Status: AC
Start: 2017-05-31 — End: 2017-05-31
  Administered 2017-05-31: 22:00:00 via INTRAVENOUS

## 2017-05-31 MED ORDER — ONDANSETRON (PF) 4 MG/2 ML INJECTION
4 mg/2 mL | INTRAMUSCULAR | Status: AC
Start: 2017-05-31 — End: 2017-05-31
  Administered 2017-05-31: 23:00:00 via INTRAVENOUS

## 2017-05-31 MED ORDER — ONDANSETRON 4 MG TAB, RAPID DISSOLVE
4 mg | ORAL_TABLET | ORAL | 0 refills | Status: AC
Start: 2017-05-31 — End: ?

## 2017-05-31 MED ORDER — NITROFURANTOIN (25% MACROCRYSTAL FORM) 100 MG CAP
100 mg | ORAL_CAPSULE | Freq: Two times a day (BID) | ORAL | 0 refills | Status: AC
Start: 2017-05-31 — End: 2017-06-03

## 2017-05-31 MED ORDER — ACETAMINOPHEN 500 MG TAB
500 mg | ORAL | Status: AC
Start: 2017-05-31 — End: 2017-05-31
  Administered 2017-05-31: 23:00:00 via ORAL

## 2017-05-31 MED ORDER — SODIUM CHLORIDE 0.9% BOLUS IV
0.9 % | Freq: Once | INTRAVENOUS | Status: DC
Start: 2017-05-31 — End: 2017-05-31
  Administered 2017-05-31: 22:00:00 via INTRAVENOUS

## 2017-05-31 MED ORDER — SODIUM CHLORIDE 0.9% BOLUS IV
0.9 % | Freq: Once | INTRAVENOUS | Status: AC
Start: 2017-05-31 — End: 2017-05-31
  Administered 2017-05-31: 22:00:00 via INTRAVENOUS

## 2017-05-31 MED FILL — SODIUM CHLORIDE 0.9 % IV: INTRAVENOUS | Qty: 1000

## 2017-05-31 MED FILL — ONDANSETRON (PF) 4 MG/2 ML INJECTION: 4 mg/2 mL | INTRAMUSCULAR | Qty: 2

## 2017-05-31 MED FILL — MAPAP EXTRA STRENGTH 500 MG TABLET: 500 mg | ORAL | Qty: 2

## 2017-05-31 NOTE — ED Notes (Signed)
I have reviewed discharge instructions with the patient.  The patient verbalized understanding.

## 2017-05-31 NOTE — ED Notes (Cosign Needed)
I performed a brief evaluation, including history and physical, of the patient here in triage and I have determined that pt will need further treatment and evaluation from the main side ER physician.  I have placed initial orders to help in expediting patients care.     May 31, 2017 at 5:00 PM - Winn Jockarren A Abdulwahab Demelo, PA        Visit Vitals   ??? BP (!) 192/99 (BP 1 Location: Right arm, BP Patient Position: At rest;Sitting)   ??? Pulse (!) 106   ??? Temp 97.9 ??F (36.6 ??C)   ??? Resp 18   ??? Ht 4\' 10"  (1.473 m)   ??? Wt 59 kg (130 lb)   ??? SpO2 100%   ??? BMI 27.17 kg/m2

## 2017-05-31 NOTE — ED Notes (Signed)
Patient receives her medications in [place], unable to assess medications

## 2017-05-31 NOTE — ED Notes (Signed)
Bedside shift change report given to Jama FlavorsAlina, RN (oncoming nurse) by Selena BattenKim, RN (offgoing nurse). Report included the following information SBAR.     Pt now complaining of headache. Will notify provider.

## 2017-05-31 NOTE — ED Notes (Signed)
Patient complains of headache. Dr. Cindra PresumeBeaudette notified. Order received. See MAR.

## 2017-05-31 NOTE — ED Notes (Signed)
Pt given crackers and water for PO challenge. Pt vomited shortly after. DR Cindra PresumeBeaudette notified.

## 2017-05-31 NOTE — ED Provider Notes (Signed)
EMERGENCY DEPARTMENT HISTORY AND PHYSICAL EXAM    5:18 PM      Date: 05/31/2017  Patient Name: Misty Matthews    History of Presenting Illness     Chief Complaint   Patient presents with   ??? Nausea   ??? Vomiting   ??? Diarrhea         History Provided By: Patient    Chief Complaint: Nausea  Duration:  2 days  Timing:  Acute  Location: n/a  Quality: n/a  Severity: moderate  Modifying Factors: No modifying or aggravating factors were reported.  Associated Symptoms: Vomiting, fatigue      Additional History (Context): 5:21 PM Misty Matthews is a 71 y.o. female with h/o depression and anemia who presents to ED complaining of acute moderate nausea with associated sx of vomiting and fatigue with onset 2 days ago. Pt states "I think I've eaten something that has made me sick." Denies diarrhea or current pain. Denies taking any medications for the current sx. Pt has a shx of a cesarean section, hysterectomy, cholecystectomy, and appendectomy. Denies a bowel resection. No modifying or aggravating factors were reported. No other concerns or symptoms at this time.    PCP: Not On File [facility]      Current Outpatient Prescriptions   Medication Sig Dispense Refill   ??? buPROPion (WELLBUTRIN) 100 mg tablet Take 150 mg by mouth daily.     ??? venlafaxine-SR (EFFEXOR XR) 150 mg capsule Take  by mouth daily.     ??? simvastatin (ZOCOR) 20 mg tablet Take 20 mg by mouth nightly.     ??? busPIRone (BUSPAR) 10 mg tablet Take 10 mg by mouth three (3) times daily.     ??? Omeprazole delayed release (PRILOSEC D/R) 20 mg tablet Take 20 mg by mouth daily.     ??? OLANZapine (ZYPREXA) 5 mg tablet Take 5 mg by mouth nightly.     ??? multivitamin (ONE A DAY) tablet Take 1 Tab by mouth daily.     ??? Cholecalciferol, Vitamin D3, 3,000 unit tab Take  by mouth.     ??? ondansetron (ZOFRAN ODT) 4 mg disintegrating tablet Take 1-2 tablets every 6-8 hours as needed for nausea and vomiting. 10 Tab 0   ??? nitrofurantoin, macrocrystal-monohydrate, (MACROBID) 100 mg capsule Take  1 Cap by mouth two (2) times a day for 3 days. 6 Cap 0       Past History     Past Medical History:  History reviewed. No pertinent past medical history.    Past Surgical History:  Past Surgical History:   Procedure Laterality Date   ??? HX CESAREAN SECTION     ??? HX HYSTERECTOMY     ??? HX ORTHOPAEDIC         Family History:  History reviewed. No pertinent family history.    Social History:  Social History   Substance Use Topics   ??? Smoking status: Never Smoker   ??? Smokeless tobacco: Never Used   ??? Alcohol use No       Allergies:  No Known Allergies      Review of Systems     Review of Systems   Constitutional: Positive for fatigue. Negative for diaphoresis and fever.   HENT: Negative for congestion and sore throat.    Eyes: Negative for pain and itching.   Respiratory: Negative for cough and shortness of breath.    Cardiovascular: Negative for chest pain and palpitations.   Gastrointestinal: Positive for nausea and vomiting.  Negative for abdominal pain and diarrhea.   Endocrine: Negative for polydipsia and polyuria.   Genitourinary: Negative for dysuria and hematuria.   Musculoskeletal: Negative for arthralgias and myalgias.   Skin: Negative for rash and wound.   Neurological: Negative for seizures and syncope.   Hematological: Does not bruise/bleed easily.   Psychiatric/Behavioral: Negative for agitation and hallucinations.         Physical Exam     Visit Vitals   ??? BP 170/77   ??? Pulse 97   ??? Temp 97.7 ??F (36.5 ??C)   ??? Resp 15   ??? Ht 4' 10"  (1.473 m)   ??? Wt 59 kg (130 lb)   ??? SpO2 97%   ??? BMI 27.17 kg/m2       Physical Exam   Constitutional: She appears well-developed and well-nourished.   Pt feels warm to touch   HENT:   Head: Normocephalic and atraumatic.   Eyes: Conjunctivae are normal. No scleral icterus.   Neck: Normal range of motion. Neck supple. No JVD present.   Cardiovascular: Normal rate, regular rhythm and normal heart sounds.    4 intact extremity pulses    Pulmonary/Chest: Effort normal and breath sounds normal.   Abdominal: Soft. She exhibits no mass. There is no tenderness.   Musculoskeletal: Normal range of motion.   Lymphadenopathy:     She has no cervical adenopathy.   Neurological: She is alert.   Skin: Skin is warm and dry.   Nursing note and vitals reviewed.        Diagnostic Study Results   Labs -  Recent Results (from the past 12 hour(s))   URINALYSIS W/ RFLX MICROSCOPIC    Collection Time: 05/31/17  5:10 PM   Result Value Ref Range    Color YELLOW      Appearance CLOUDY      Specific gravity 1.016 1.005 - 1.030      pH (UA) 5.5 5.0 - 8.0      Protein NEGATIVE  NEG mg/dL    Glucose NEGATIVE  NEG mg/dL    Ketone 15 (A) NEG mg/dL    Bilirubin NEGATIVE  NEG      Blood NEGATIVE  NEG      Urobilinogen 1.0 0.2 - 1.0 EU/dL    Nitrites NEGATIVE  NEG      Leukocyte Esterase SMALL (A) NEG     URINE MICROSCOPIC ONLY    Collection Time: 05/31/17  5:10 PM   Result Value Ref Range    WBC 4 to 6 0 - 4 /hpf    RBC 0 0 - 5 /hpf    Epithelial cells 1+ 0 - 5 /lpf    Bacteria 2+ (A) NEG /hpf   CBC WITH AUTOMATED DIFF    Collection Time: 05/31/17  5:22 PM   Result Value Ref Range    WBC 9.3 4.6 - 13.2 K/uL    RBC 4.66 4.20 - 5.30 M/uL    HGB 11.2 (L) 12.0 - 16.0 g/dL    HCT 37.6 35.0 - 45.0 %    MCV 80.7 74.0 - 97.0 FL    MCH 24.0 24.0 - 34.0 PG    MCHC 29.8 (L) 31.0 - 37.0 g/dL    RDW 18.5 (H) 11.6 - 14.5 %    PLATELET 434 (H) 135 - 420 K/uL    MPV 9.7 9.2 - 11.8 FL    NEUTROPHILS 81 (H) 40 - 73 %    LYMPHOCYTES 8 (L) 21 -  52 %    MONOCYTES 11 (H) 3 - 10 %    EOSINOPHILS 0 0 - 5 %    BASOPHILS 0 0 - 2 %    ABS. NEUTROPHILS 7.5 1.8 - 8.0 K/UL    ABS. LYMPHOCYTES 0.8 (L) 0.9 - 3.6 K/UL    ABS. MONOCYTES 1.0 0.05 - 1.2 K/UL    ABS. EOSINOPHILS 0.0 0.0 - 0.4 K/UL    ABS. BASOPHILS 0.0 0.0 - 0.1 K/UL    DF AUTOMATED     LIPASE    Collection Time: 05/31/17  5:22 PM   Result Value Ref Range    Lipase 76 73 - 045 U/L   METABOLIC PANEL, COMPREHENSIVE     Collection Time: 05/31/17  5:22 PM   Result Value Ref Range    Sodium 140 136 - 145 mmol/L    Potassium 4.5 3.5 - 5.5 mmol/L    Chloride 104 100 - 108 mmol/L    CO2 25 21 - 32 mmol/L    Anion gap 11 3.0 - 18 mmol/L    Glucose 161 (H) 74 - 99 mg/dL    BUN 13 7.0 - 18 MG/DL    Creatinine 0.63 0.6 - 1.3 MG/DL    BUN/Creatinine ratio 21 (H) 12 - 20      GFR est AA >60 >60 ml/min/1.39m    GFR est non-AA >60 >60 ml/min/1.771m   Calcium 9.8 8.5 - 10.1 MG/DL    Bilirubin, total 0.5 0.2 - 1.0 MG/DL    ALT (SGPT) 17 13 - 56 U/L    AST (SGOT) 20 15 - 37 U/L    Alk. phosphatase 120 (H) 45 - 117 U/L    Protein, total 7.7 6.4 - 8.2 g/dL    Albumin 4.1 3.4 - 5.0 g/dL    Globulin 3.6 2.0 - 4.0 g/dL    A-G Ratio 1.1 0.8 - 1.7         Radiologic Studies -   No orders to display     No results found.    Medications ordered:   Medications   sodium chloride 0.9 % bolus infusion 1,000 mL (0 mL IntraVENous IV Completed 05/31/17 1808)   ondansetron (ZOFRAN) injection 4 mg (4 mg IntraVENous Given 05/31/17 1732)         Medical Decision Making   Initial Medical Decision Making and DDx:  Possible food poisoning, viral illness causing vomiting, possible UTI. Doubtful that this is pancreatitis, bowel obstruction or perforation.    ED Course: Progress Notes, Reevaluation, and Consults:  6:39 PM Low sats noted but pt is comfortable. Asymptomatic. This is probably her COPD. Labs suggested UTI, will treat with Macrobid. Will give rx for Zofran. Return to the ED if symptoms worsen.    I am the first provider for this patient.    I reviewed the vital signs, available nursing notes, past medical history, past surgical history, family history and social history.    Vital Signs-Reviewed the patient's vital signs.    Pulse Oximetry Analysis - 100% on room air- stable    Records Reviewed: Nursing Notes (Time of Review: 5:18 PM)    Diagnosis     Clinical Impression:   1. Urinary tract infection without hematuria, site unspecified     2. Non-intractable vomiting with nausea, unspecified vomiting type        Disposition: discharge    Follow-up Information     Follow up With Details Comments Contact Info    Not On  File [facility]   Not On File (58) Patient has a PCP but that physician is not listed in [facility].             Patient's Medications   Start Taking    NITROFURANTOIN, MACROCRYSTAL-MONOHYDRATE, (MACROBID) 100 MG CAPSULE    Take 1 Cap by mouth two (2) times a day for 3 days.    ONDANSETRON (ZOFRAN ODT) 4 MG DISINTEGRATING TABLET    Take 1-2 tablets every 6-8 hours as needed for nausea and vomiting.   Continue Taking    BUPROPION (WELLBUTRIN) 100 MG TABLET    Take 150 mg by mouth daily.    BUSPIRONE (BUSPAR) 10 MG TABLET    Take 10 mg by mouth three (3) times daily.    CHOLECALCIFEROL, VITAMIN D3, 3,000 UNIT TAB    Take  by mouth.    MULTIVITAMIN (ONE A DAY) TABLET    Take 1 Tab by mouth daily.    OLANZAPINE (ZYPREXA) 5 MG TABLET    Take 5 mg by mouth nightly.    OMEPRAZOLE DELAYED RELEASE (PRILOSEC D/R) 20 MG TABLET    Take 20 mg by mouth daily.    SIMVASTATIN (ZOCOR) 20 MG TABLET    Take 20 mg by mouth nightly.    VENLAFAXINE-SR (EFFEXOR XR) 150 MG CAPSULE    Take  by mouth daily.   These Medications have changed    No medications on file   Stop Taking    No medications on file     _______________________________    Attestations:  [place] acting as a scribe for and in the presence of Freddie Apley, MD      May 31, 2017 at [facility] PM       Provider Attestation:      I personally performed the services described in the documentation, reviewed the documentation, as recorded by the scribe in my presence, and it accurately and completely records my words and actions. May 31, 2017 at 5:18 PM - Freddie Apley, MD    _______________________________

## 2017-05-31 NOTE — ED Notes (Addendum)
Pts O2 sat noted to drop down to 85% on RA with good pleth. Adjusted pulse oximeter. Placed on O2 via NC at 4L/min. Denies SOB or any other complaint at this time. Provider notified.

## 2017-06-02 ENCOUNTER — Emergency Department: Payer: Medicare Other | Admitting: Registered Nurse

## 2017-06-02 ENCOUNTER — Encounter: Payer: Self-pay | Admitting: Emergency Medicine

## 2017-06-02 ENCOUNTER — Encounter
Admission: EM | Disposition: A | Discharge status: Home / Self Care | Payer: Self-pay | Source: Home / Self Care | Attending: Surgery

## 2017-06-02 ENCOUNTER — Emergency Department: Payer: Medicare Other

## 2017-06-02 ENCOUNTER — Inpatient Hospital Stay
Admission: EM | Admit: 2017-06-02 | Discharge: 2017-06-06 | DRG: 354 | Disposition: A | Discharge status: Home / Self Care | Payer: Medicare Other | Attending: Surgery | Admitting: Surgery

## 2017-06-02 DIAGNOSIS — Z885 Allergy status to narcotic agent status: Secondary | ICD-10-CM

## 2017-06-02 DIAGNOSIS — Z90710 Acquired absence of both cervix and uterus: Secondary | ICD-10-CM

## 2017-06-02 DIAGNOSIS — M6281 Muscle weakness (generalized): Secondary | ICD-10-CM | POA: Diagnosis not present

## 2017-06-02 DIAGNOSIS — F319 Bipolar disorder, unspecified: Secondary | ICD-10-CM | POA: Diagnosis present

## 2017-06-02 DIAGNOSIS — Z4682 Encounter for fitting and adjustment of non-vascular catheter: Secondary | ICD-10-CM | POA: Diagnosis not present

## 2017-06-02 DIAGNOSIS — J9811 Atelectasis: Secondary | ICD-10-CM | POA: Diagnosis not present

## 2017-06-02 DIAGNOSIS — J449 Chronic obstructive pulmonary disease, unspecified: Secondary | ICD-10-CM | POA: Diagnosis present

## 2017-06-02 DIAGNOSIS — K430 Incisional hernia with obstruction, without gangrene: Secondary | ICD-10-CM | POA: Diagnosis present

## 2017-06-02 DIAGNOSIS — M170 Bilateral primary osteoarthritis of knee: Secondary | ICD-10-CM | POA: Diagnosis not present

## 2017-06-02 DIAGNOSIS — R296 Repeated falls: Secondary | ICD-10-CM | POA: Diagnosis not present

## 2017-06-02 DIAGNOSIS — Z7951 Long term (current) use of inhaled steroids: Secondary | ICD-10-CM | POA: Diagnosis not present

## 2017-06-02 DIAGNOSIS — K460 Unspecified abdominal hernia with obstruction, without gangrene: Secondary | ICD-10-CM | POA: Insufficient documentation

## 2017-06-02 DIAGNOSIS — Z7401 Bed confinement status: Secondary | ICD-10-CM | POA: Diagnosis not present

## 2017-06-02 DIAGNOSIS — K436 Other and unspecified ventral hernia with obstruction, without gangrene: Principal | ICD-10-CM | POA: Diagnosis present

## 2017-06-02 DIAGNOSIS — N390 Urinary tract infection, site not specified: Secondary | ICD-10-CM | POA: Diagnosis not present

## 2017-06-02 DIAGNOSIS — R000 Tachycardia, unspecified: Secondary | ICD-10-CM | POA: Diagnosis not present

## 2017-06-02 DIAGNOSIS — R2681 Unsteadiness on feet: Secondary | ICD-10-CM | POA: Diagnosis not present

## 2017-06-02 DIAGNOSIS — K56609 Unspecified intestinal obstruction, unspecified as to partial versus complete obstruction: Secondary | ICD-10-CM | POA: Diagnosis not present

## 2017-06-02 DIAGNOSIS — K5730 Diverticulosis of large intestine without perforation or abscess without bleeding: Secondary | ICD-10-CM | POA: Diagnosis not present

## 2017-06-02 DIAGNOSIS — R6889 Other general symptoms and signs: Secondary | ICD-10-CM | POA: Diagnosis not present

## 2017-06-02 DIAGNOSIS — K219 Gastro-esophageal reflux disease without esophagitis: Secondary | ICD-10-CM | POA: Diagnosis not present

## 2017-06-02 HISTORY — PX: VENTRAL HERNIA REPAIR: SHX424

## 2017-06-02 HISTORY — DX: Incisional hernia with obstruction, without gangrene: K43.0

## 2017-06-02 LAB — BASIC METABOLIC PANEL
ANION GAP: 12 (ref 5–15)
BUN: 17 mg/dL (ref 6–20)
CALCIUM: 8.6 mg/dL — AB (ref 8.9–10.3)
CO2: 26 mmol/L (ref 22–32)
Chloride: 97 mmol/L — ABNORMAL LOW (ref 101–111)
Creatinine, Ser: 0.58 mg/dL (ref 0.44–1.00)
GFR calc Af Amer: >60 mL/min (ref >60)
GLUCOSE: 125 mg/dL — AB (ref 65–99)
Potassium: 3.8 mmol/L (ref 3.5–5.1)
Sodium: 135 mmol/L (ref 135–145)

## 2017-06-02 LAB — URINALYSIS, COMPLETE (UACMP) WITH MICROSCOPIC
Bilirubin Urine: NEGATIVE
GLUCOSE, UA: NEGATIVE mg/dL
HGB URINE DIPSTICK: NEGATIVE
Ketones, ur: 20 mg/dL — AB
NITRITE: NEGATIVE
PROTEIN: NEGATIVE mg/dL
SPECIFIC GRAVITY, URINE: 1.019 (ref 1.005–1.030)
pH: 5 (ref 5.0–8.0)

## 2017-06-02 LAB — TSH: TSH: 0.754 u[IU]/mL (ref 0.350–4.500)

## 2017-06-02 LAB — CBC
HCT: 37.4 % (ref 35.0–47.0)
HEMOGLOBIN: 11.8 g/dL — AB (ref 12.0–16.0)
MCH: 24.1 pg — ABNORMAL LOW (ref 26.0–34.0)
MCHC: 31.6 g/dL — AB (ref 32.0–36.0)
MCV: 76.1 fL — ABNORMAL LOW (ref 80.0–100.0)
Platelets: 431 10*3/uL (ref 150–440)
RBC: 4.92 MIL/uL (ref 3.80–5.20)
RDW: 19.7 % — ABNORMAL HIGH (ref 11.5–14.5)
WBC: 6.3 10*3/uL (ref 3.6–11.0)

## 2017-06-02 LAB — LIPASE, BLOOD: Lipase: 24 U/L (ref 11–51)

## 2017-06-02 LAB — GLUCOSE, CAPILLARY: Glucose-Capillary: 175 mg/dL — ABNORMAL HIGH (ref 65–99)

## 2017-06-02 LAB — LACTIC ACID, PLASMA: Lactic Acid, Venous: 1 mmol/L (ref 0.5–1.9)

## 2017-06-02 LAB — TROPONIN I: Troponin I: <0.03 ng/mL (ref <0.03)

## 2017-06-02 SURGERY — REPAIR, HERNIA, VENTRAL
Anesthesia: General

## 2017-06-02 MED ORDER — ONDANSETRON HCL 4 MG/2ML IJ SOLN
INTRAMUSCULAR | Status: AC
  Filled 2017-06-02: qty 2

## 2017-06-02 MED ORDER — ONDANSETRON HCL 4 MG/2ML IJ SOLN: 4.0000 mg | Freq: Four times a day (QID) | INTRAMUSCULAR | Status: DC | PRN

## 2017-06-02 MED ORDER — PROPOFOL 10 MG/ML IV BOLUS
INTRAVENOUS | Status: AC
  Filled 2017-06-02: qty 40

## 2017-06-02 MED ORDER — LIDOCAINE HCL (PF) 2 % IJ SOLN
INTRAMUSCULAR | Status: AC
  Filled 2017-06-02: qty 2

## 2017-06-02 MED ORDER — ONDANSETRON HCL 4 MG/2ML IJ SOLN
INTRAMUSCULAR | Status: DC | PRN
Start: 2017-06-02 — End: 2017-06-02
  Administered 2017-06-02: 4 mg via INTRAVENOUS

## 2017-06-02 MED ORDER — DEXTROSE-NACL 5-0.9 % IV SOLN
INTRAVENOUS | Status: DC
  Administered 2017-06-02 – 2017-06-04 (×6): via INTRAVENOUS

## 2017-06-02 MED ORDER — IOPAMIDOL (ISOVUE-300) INJECTION 61%
100.0000 mL | Freq: Once | INTRAVENOUS | Status: AC | PRN
  Administered 2017-06-02: 100 mL via INTRAVENOUS

## 2017-06-02 MED ORDER — ROCURONIUM BROMIDE 100 MG/10ML IV SOLN
INTRAVENOUS | Status: DC | PRN
  Administered 2017-06-02: 30 mg via INTRAVENOUS

## 2017-06-02 MED ORDER — HEPARIN SODIUM (PORCINE) 5000 UNIT/ML IJ SOLN
5000.0000 [IU] | Freq: Three times a day (TID) | INTRAMUSCULAR | Status: DC
  Administered 2017-06-02: 5000 [IU] via SUBCUTANEOUS
  Filled 2017-06-02: qty 1

## 2017-06-02 MED ORDER — ZIPRASIDONE MESYLATE 20 MG IM SOLR
10.0000 mg | Freq: Four times a day (QID) | INTRAMUSCULAR | Status: DC | PRN
  Filled 2017-06-02: qty 20

## 2017-06-02 MED ORDER — FENTANYL CITRATE (PF) 100 MCG/2ML IJ SOLN
INTRAMUSCULAR | Status: AC
  Filled 2017-06-02: qty 2

## 2017-06-02 MED ORDER — ONDANSETRON HCL 4 MG PO TABS: 4.0000 mg | ORAL_TABLET | Freq: Four times a day (QID) | ORAL | Status: DC | PRN

## 2017-06-02 MED ORDER — SUCCINYLCHOLINE CHLORIDE 20 MG/ML IJ SOLN
INTRAMUSCULAR | Status: DC | PRN
  Administered 2017-06-02: 100 mg via INTRAVENOUS

## 2017-06-02 MED ORDER — SODIUM CHLORIDE 0.9 % IV BOLUS (SEPSIS)
1000.0000 mL | Freq: Once | INTRAVENOUS | Status: AC
  Administered 2017-06-02: 1000 mL via INTRAVENOUS

## 2017-06-02 MED ORDER — PHENYLEPHRINE HCL 10 MG/ML IJ SOLN
INTRAMUSCULAR | Status: DC | PRN
  Administered 2017-06-02 (×2): 200 ug via INTRAVENOUS

## 2017-06-02 MED ORDER — ONDANSETRON 4 MG PO TBDP
4.0000 mg | ORAL_TABLET | Freq: Once | ORAL | Status: AC
  Administered 2017-06-02: 4 mg via ORAL

## 2017-06-02 MED ORDER — MOMETASONE FURO-FORMOTEROL FUM 200-5 MCG/ACT IN AERO
2.0000 | INHALATION_SPRAY | Freq: Two times a day (BID) | RESPIRATORY_TRACT | Status: DC
  Administered 2017-06-02 – 2017-06-06 (×8): 2 via RESPIRATORY_TRACT
  Filled 2017-06-02: qty 8.8

## 2017-06-02 MED ORDER — PROMETHAZINE HCL 25 MG/ML IJ SOLN: 6.2500 mg | INTRAMUSCULAR | Status: DC | PRN

## 2017-06-02 MED ORDER — MORPHINE SULFATE (PF) 4 MG/ML IV SOLN
2.0000 mg | INTRAVENOUS | Status: DC | PRN
  Administered 2017-06-02 – 2017-06-04 (×9): 2 mg via INTRAVENOUS
  Filled 2017-06-02 (×10): qty 1

## 2017-06-02 MED ORDER — CEFAZOLIN SODIUM-DEXTROSE 2-4 GM/100ML-% IV SOLN
2.0000 g | INTRAVENOUS | Status: DC
  Filled 2017-06-02: qty 100

## 2017-06-02 MED ORDER — ROCURONIUM BROMIDE 50 MG/5ML IV SOLN
INTRAVENOUS | Status: AC
  Filled 2017-06-02: qty 1

## 2017-06-02 MED ORDER — LACTATED RINGERS IV SOLN
INTRAVENOUS | Status: DC | PRN
  Administered 2017-06-02: 10:00:00 via INTRAVENOUS

## 2017-06-02 MED ORDER — FENTANYL CITRATE (PF) 100 MCG/2ML IJ SOLN
25.0000 ug | INTRAMUSCULAR | Status: DC | PRN
  Administered 2017-06-02 (×2): 50 ug via INTRAVENOUS

## 2017-06-02 MED ORDER — DEXAMETHASONE SODIUM PHOSPHATE 10 MG/ML IJ SOLN
INTRAMUSCULAR | Status: DC | PRN
  Administered 2017-06-02: 10 mg via INTRAVENOUS

## 2017-06-02 MED ORDER — LIDOCAINE HCL (CARDIAC) 20 MG/ML IV SOLN
INTRAVENOUS | Status: DC | PRN
  Administered 2017-06-02: 80 mg via INTRAVENOUS

## 2017-06-02 MED ORDER — EPINEPHRINE PF 1 MG/ML IJ SOLN
INTRAMUSCULAR | Status: AC
  Filled 2017-06-02: qty 1

## 2017-06-02 MED ORDER — IOPAMIDOL (ISOVUE-300) INJECTION 61%
30.0000 mL | Freq: Once | INTRAVENOUS | Status: AC
  Administered 2017-06-02: 30 mL via ORAL

## 2017-06-02 MED ORDER — LORAZEPAM 2 MG/ML IJ SOLN
2.0000 mg | Freq: Four times a day (QID) | INTRAMUSCULAR | Status: DC | PRN
  Administered 2017-06-02: 2 mg via INTRAVENOUS
  Filled 2017-06-02: qty 1

## 2017-06-02 MED ORDER — HEPARIN SODIUM (PORCINE) 5000 UNIT/ML IJ SOLN
5000.0000 [IU] | Freq: Three times a day (TID) | INTRAMUSCULAR | Status: DC
  Administered 2017-06-03 – 2017-06-06 (×11): 5000 [IU] via SUBCUTANEOUS
  Filled 2017-06-02 (×11): qty 1

## 2017-06-02 MED ORDER — ONDANSETRON HCL 4 MG/2ML IJ SOLN
4.0000 mg | Freq: Once | INTRAMUSCULAR | Status: AC
  Administered 2017-06-02: 4 mg via INTRAVENOUS
  Filled 2017-06-02: qty 2

## 2017-06-02 MED ORDER — ONDANSETRON 4 MG PO TBDP
ORAL_TABLET | ORAL | Status: AC
  Filled 2017-06-02: qty 1

## 2017-06-02 MED ORDER — MORPHINE SULFATE (PF) 4 MG/ML IV SOLN
INTRAVENOUS | Status: AC
  Filled 2017-06-02: qty 1

## 2017-06-02 MED ORDER — FENTANYL CITRATE (PF) 100 MCG/2ML IJ SOLN
INTRAMUSCULAR | Status: DC | PRN
  Administered 2017-06-02 (×3): 25 ug via INTRAVENOUS
  Administered 2017-06-02: 50 ug via INTRAVENOUS

## 2017-06-02 MED ORDER — DEXAMETHASONE SODIUM PHOSPHATE 10 MG/ML IJ SOLN
INTRAMUSCULAR | Status: AC
  Filled 2017-06-02: qty 1

## 2017-06-02 MED ORDER — MORPHINE SULFATE (PF) 4 MG/ML IV SOLN
4.0000 mg | Freq: Once | INTRAVENOUS | Status: AC
  Administered 2017-06-02: 4 mg via INTRAVENOUS

## 2017-06-02 MED ORDER — PROPOFOL 10 MG/ML IV BOLUS
INTRAVENOUS | Status: DC | PRN
  Administered 2017-06-02: 110 mg via INTRAVENOUS

## 2017-06-02 MED ORDER — MEPERIDINE HCL 50 MG/ML IJ SOLN: 6.2500 mg | INTRAMUSCULAR | Status: DC | PRN

## 2017-06-02 MED ORDER — BUPIVACAINE-EPINEPHRINE (PF) 0.25% -1:200000 IJ SOLN
INTRAMUSCULAR | Status: DC | PRN
  Administered 2017-06-02: 30 mL via PERINEURAL

## 2017-06-02 MED ORDER — BUPIVACAINE HCL (PF) 0.25 % IJ SOLN
INTRAMUSCULAR | Status: AC
  Filled 2017-06-02: qty 30

## 2017-06-02 MED ORDER — MIDAZOLAM HCL 2 MG/2ML IJ SOLN
INTRAMUSCULAR | Status: AC
  Filled 2017-06-02: qty 2

## 2017-06-02 MED ORDER — HYDROMORPHONE HCL 1 MG/ML IJ SOLN: 0.2500 mg | INTRAMUSCULAR | Status: DC | PRN

## 2017-06-02 MED ORDER — SUGAMMADEX SODIUM 500 MG/5ML IV SOLN
INTRAVENOUS | Status: AC
  Filled 2017-06-02: qty 5

## 2017-06-02 MED ORDER — MIDAZOLAM HCL 2 MG/2ML IJ SOLN
INTRAMUSCULAR | Status: DC | PRN
  Administered 2017-06-02: 2 mg via INTRAVENOUS

## 2017-06-02 SURGICAL SUPPLY — 33 items
ADHESIVE MASTISOL STRL (MISCELLANEOUS) IMPLANT
CANISTER SUCT 1200ML W/VALVE (MISCELLANEOUS) IMPLANT
CANISTER SUCT 3000ML PPV (MISCELLANEOUS) ×2 IMPLANT
CATH FOLEY 2WAY 12FR 5CC LATEX (CATHETERS) ×2 IMPLANT
CHLORAPREP W/TINT 26ML (MISCELLANEOUS) ×2 IMPLANT
DRAPE LAPAROTOMY 100X77 ABD (DRAPES) ×2 IMPLANT
DRSG OPSITE POSTOP 4X14 (GAUZE/BANDAGES/DRESSINGS) ×2 IMPLANT
ELECT REM PT RETURN 9FT ADLT (ELECTROSURGICAL) ×2
ELECTRODE REM PT RTRN 9FT ADLT (ELECTROSURGICAL) ×1 IMPLANT
ETHIBOND 2 0 GREEN CT 2 30IN (SUTURE) ×4 IMPLANT
GAUZE SPONGE 4X4 12PLY STRL (GAUZE/BANDAGES/DRESSINGS) ×2 IMPLANT
GLOVE BIO SURGEON STRL SZ8 (GLOVE) ×8 IMPLANT
GOWN STRL REUS W/ TWL LRG LVL3 (GOWN DISPOSABLE) ×2 IMPLANT
GOWN STRL REUS W/TWL LRG LVL3 (GOWN DISPOSABLE) ×2
KIT RM TURNOVER STRD PROC AR (KITS) ×2 IMPLANT
LABEL OR SOLS (LABEL) ×2 IMPLANT
NDL SAFETY 22GX1.5 (NEEDLE) ×2 IMPLANT
NS IRRIG 500ML POUR BTL (IV SOLUTION) ×2 IMPLANT
PACK BASIN MINOR ARMC (MISCELLANEOUS) ×2 IMPLANT
SPONGE LAP 18X18 5 PK (GAUZE/BANDAGES/DRESSINGS) ×2 IMPLANT
STAPLER SKIN PROX 35W (STAPLE) ×2 IMPLANT
STRIP CLOSURE SKIN 1/2X4 (GAUZE/BANDAGES/DRESSINGS) ×2 IMPLANT
SUT MNCRL 4-0 (SUTURE) ×1
SUT MNCRL 4-0 27XMFL (SUTURE) ×1
SUT PDS AB 1 TP1 96 (SUTURE) ×4 IMPLANT
SUT PROLENE 0 CT 1 30 (SUTURE) ×6 IMPLANT
SUT PROLENE 0 CT 1 CR/8 (SUTURE) ×2 IMPLANT
SUT VIC AB 0 CT2 27 (SUTURE) ×4 IMPLANT
SUT VIC AB 3-0 SH 27 (SUTURE) ×1
SUT VIC AB 3-0 SH 27X BRD (SUTURE) ×1 IMPLANT
SUTURE MNCRL 4-0 27XMF (SUTURE) ×1 IMPLANT
SYRINGE 10CC LL (SYRINGE) ×2 IMPLANT
TRAY FOLEY W/METER SILVER 16FR (SET/KITS/TRAYS/PACK) ×2 IMPLANT

## 2017-06-02 NOTE — ED Notes (Signed)
Patient states she has "a virus". States she is hurting all over and has "a kidney infection." Patient states she has felt bad since Wednesday. Seen at [facility] and diagnosed with kidney infection and given antibiotics. Patient states she can't keep the medicine down.

## 2017-06-02 NOTE — Transfer of Care (Signed)
Immediate Anesthesia Transfer of Care Note  Patient: Pamela Ball  Procedure(s) Performed: Procedure(s): exploratory laparotomy repair of incarcerated  VENTRAL hernia (N/A)  Patient Location: PACU  Anesthesia Type:General  Level of Consciousness: sedated  Airway & Oxygen Therapy: Patient Spontanous Breathing and Patient connected to face mask oxygen  Post-op Assessment: Report given to RN and Post -op Vital signs reviewed and stable  Post vital signs: Reviewed and stable  Last Vitals:  Vitals:   06/02/17 0830 06/02/17 1112  BP: (!) 156/65 (!) 160/76  Pulse: 84 (!) 104  Resp: 16 16  Temp:  53.7 C    Complications: No apparent anesthesia complications

## 2017-06-02 NOTE — ED Provider Notes (Signed)
[facility] Emergency Department Provider Note  ____________________________________________   First MD Initiated Contact with Patient 06/02/17 [phone]     (approximate)  I have reviewed the triage vital signs and the nursing notes.   HISTORY  Chief Complaint Weakness and Emesis   HPI Pamela Ball is a 71 y.o. female with a history of depression as well as bipolar disorder who is presenting with 4 days of nausea, vomiting and abdominal pain. She says the pain is resolved at this time after nausea medication but it was across her midabdomen previously. She says that she presented to a hospital in [place] several days ago and was diagnosed with UTI. The patient denies having a CAT scan at that time. Has been taking Macrobid and Zofran since then but is unable to tolerate either because of persistent nausea and vomiting. The patient says that she has not been able to eat or drink anything over the past several days. Denies any diarrhea. Says that she hasn't had a bowel movement in 3 days and usually moves her bowels once a day. Says that she is also not been able to pass gas today.The patient is also complaining of generalized weakness.   Past Medical History:  Diagnosis Date  . Bipolar affective disorder ([place])   . Depression   . Glaucoma   . Hyperlipidemia     Patient Active Problem List   Diagnosis Date Noted  . Normocytic anemia 05/23/2017  . History of abdominal hernia 05/21/2017  . Bipolar 1 disorder, depressed, full remission ([place]) 02/15/2017  . GERD (gastroesophageal reflux disease) 02/15/2017  . COPD (chronic obstructive pulmonary disease) ([place]) 02/15/2017  . 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 Hyperlipidemia 02/15/2017  . Presbycusis of both ears 02/15/2017  . History of compression fracture of spine 02/15/2017  . Anxiety 02/15/2017    Past Surgical History:  Procedure Laterality Date  . ABDOMINAL HYSTERECTOMY   11/06/2006  . KYPHOPLASTY      Prior to Admission medications   Medication Sig Start Date End Date Taking? Authorizing Provider  buPROPion (WELLBUTRIN XL) 150 MG 24 hr tablet Take 150 mg by mouth. 01/11/15   [provider]  busPIRone (BUSPAR) 10 MG tablet Take 1 tablet (10 mg total) by mouth daily. 02/15/17   Karamalegos, Devonne Doughty, DO  Fluticasone-Salmeterol (ADVAIR) 250-50 MCG/DOSE AEPB Inhale 1 puff into the lungs 2 (two) times daily. 05/21/17   Karamalegos, Alexander J, DO  OLANZapine (ZYPREXA) 5 MG tablet Take 5 mg by mouth. 01/11/15   [provider]  omeprazole (PRILOSEC) 20 MG capsule  12/26/16   [provider]  simvastatin (ZOCOR) 20 MG tablet Take 1 tablet (20 mg total) by mouth every evening. 02/15/17   Karamalegos, Devonne Doughty, DO  venlafaxine XR (EFFEXOR-XR) 75 MG 24 hr capsule Take by mouth. 01/11/15   [provider]    Allergies Codeine  Family History  Problem Relation Age of Onset  . Cancer Mother        cancer    Social History Social History  Substance Use Topics  . Smoking status: Never Smoker  . Smokeless tobacco: Never Used  . Alcohol use No    Review of Systems  Constitutional: No fever/chills Eyes: No visual changes. ENT: No sore throat. Cardiovascular: Denies chest pain. Respiratory: Denies shortness of breath. Gastrointestinal:   No diarrhea.  No constipation. Genitourinary: Negative for dysuria. Musculoskeletal: Negative for back pain. Skin: Negative for rash. Neurological: Negative for headaches, focal weakness  or numbness.   ____________________________________________   PHYSICAL EXAM:  VITAL SIGNS: ED Triage Vitals  Enc Vitals Group     BP 06/02/17 0422 (!) 149/88     Pulse Rate 06/02/17 0422 (!) 106     Resp 06/02/17 0422 18     Temp 06/02/17 0422 97.9 F (36.6 C)     Temp Source 06/02/17 0422 Oral     SpO2 06/02/17 0422 95 %     Weight 06/02/17 0423 134 lb (60.8 kg)     Height 06/02/17 0423 4'  11" (1.499 m)     Head Circumference --      Peak Flow --      Pain Score 06/02/17 0422 10     Pain Loc --      Pain Edu? --      Excl. in [place]? --     Constitutional: Alert and oriented. Well appearing and in no acute distress. Eyes: Conjunctivae are normal.  Head: Atraumatic. Nose: No congestion/rhinnorhea. Mouth/Throat: Mucous membranes are moist.  Neck: No stridor.   Cardiovascular: Normal rate, regular rhythm. Grossly normal heart sounds.   Respiratory: Normal respiratory effort.  No retractions. Lungs CTAB. Gastrointestinal: Soft with mild left lower quadrant tenderness to palpation. No distention. No CVA tenderness. Musculoskeletal: No lower extremity tenderness nor edema.  No joint effusions. Neurologic:  Normal speech and language. No gross focal neurologic deficits are appreciated. Skin:  Skin is warm, dry and intact. No rash noted. Psychiatric: Mood and affect are normal. Speech and behavior are normal.  ____________________________________________   LABS (all labs ordered are listed, but only abnormal results are displayed)  Labs Reviewed  BASIC METABOLIC PANEL - Abnormal; Notable for the following:       Result Value   Chloride 97 (*)    Glucose, Bld 125 (*)    Calcium 8.6 (*)    All other components within normal limits  CBC - Abnormal; Notable for the following:    Hemoglobin 11.8 (*)    MCV 76.1 (*)    MCH 24.1 (*)    MCHC 31.6 (*)    RDW 19.7 (*)    All other components within normal limits  URINALYSIS, COMPLETE (UACMP) WITH MICROSCOPIC - Abnormal; Notable for the following:    Color, Urine YELLOW (*)    APPearance HAZY (*)    Ketones, ur 20 (*)    Leukocytes, UA TRACE (*)    Bacteria, UA RARE (*)    Squamous Epithelial / LPF 0-5 (*)    All other components within normal limits  LIPASE, BLOOD  TROPONIN I  LACTIC ACID, PLASMA  TSH  CBG MONITORING, ED   ____________________________________________  EKG  ED ECG REPORT I, Doran Stabler,  the attending physician, personally viewed and interpreted this ECG.   Date: 06/02/2017  EKG Time: 0422  Rate: 104  Rhythm: sinus tachycardia  Axis: Normal  Intervals:LVH  ST&T Change: No ST segment elevation or depression. No abnormal T-wave inversion.  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  ----------------------------------------- 6:32 AM on 06/02/2017 -----------------------------------------  Attempted to reduce the hernia  on exam. I'm able to partially reduce the hernia but quickly refills the hernia. I discussed the case with Dr. Dahlia Byes and he recommends a nasogastric tube. He will be down to see the patient in the emergency department. Discussed the diagnosis with the patient the need for likely admission. She is understanding of the plan and willing to comply but also discussed the G-tube at the patient.      ____________________________________________   FINAL CLINICAL IMPRESSION(S) / ED DIAGNOSES  spigelean hernia.  sbo    NEW MEDICATIONS STARTED DURING THIS VISIT:  New Prescriptions   No medications on file     Note:  This document was prepared using Dragon voice recognition software and may include unintentional dictation errors.     Orbie Pyo, MD 06/02/17 [phone]

## 2017-06-02 NOTE — ED Notes (Signed)
Per Dr. Clearnce Hasten, scan w/o oral contrast

## 2017-06-02 NOTE — Anesthesia Procedure Notes (Signed)
Procedure Name: Intubation Date/Time: 06/02/2017 10:08 AM Performed by: Doreen Salvage Pre-anesthesia Checklist: Patient identified, Emergency Drugs available, Suction available and Patient being monitored Patient Re-evaluated:Patient Re-evaluated prior to induction Oxygen Delivery Method: Circle system utilized Preoxygenation: Pre-oxygenation with 100% oxygen Induction Type: IV induction, Cricoid Pressure applied and Rapid sequence Ventilation: Mask ventilation without difficulty Laryngoscope Size: Mac, 3 and McGraph Grade View: Grade II Tube type: Oral Tube size: 7.0 mm Number of attempts: 1 Airway Equipment and Method: Stylet Placement Confirmation: ETT inserted through vocal cords under direct vision,  positive ETCO2 and breath sounds checked- equal and bilateral Secured at: 20 (at the teeth) cm Tube secured with: Tape Dental Injury: Teeth and Oropharynx as per pre-operative assessment

## 2017-06-02 NOTE — H&P (Signed)
Pamela Ball is an 71 y.o. female.    Chief Complaint: Abdominal pain nausea and vomiting  HPI: This patient with abdominal pain since Wednesday it had been gradually worsening in last night she vomited 5 times has not passed any gas and has not had a bowel movement since yesterday. She denies melena or hematochezia and is never had an episode like this before. She denies fevers or chills. This morning she states that her pain is much improved but had been in the periumbilical and right lower quadrant areas. She feels better now that her nasogastric tube is been placed and she has been given pain medication. Her pain is still present however. She's had no hematemesis. She has never had an episode like this before.  She states that she takes no medications and has no medical problems. She also states she has no allergies.  Family history is insignificant. She denies any significant family history with the exception of an unknown cancer in her father.  She is retired does not smoke nor drink.  Her past surgical history includes cholecystectomy appendectomy C-sections and hysterectomy.  Past Medical History:  Diagnosis Date  . Bipolar affective disorder ([place])   . Depression   . Glaucoma   . Hyperlipidemia     Past Surgical History:  Procedure Laterality Date  . ABDOMINAL HYSTERECTOMY  11/06/2006  . KYPHOPLASTY      Family History  Problem Relation Age of Onset  . Cancer Mother        cancer   Social History:  reports that she has never smoked. She has never used smokeless tobacco. She reports that she does not drink alcohol or use drugs.  Allergies:  Allergies  Allergen Reactions  . Codeine Other (See Comments)    Reaction: unknown Hasn't had a reaction in years     (Not in a hospital admission)   Review of Systems  Constitutional: Negative for chills and fever.  HENT: Negative.   Eyes: Negative.   Respiratory: Negative.   Cardiovascular: Negative.    Gastrointestinal: Positive for abdominal pain, nausea and vomiting. Negative for blood in stool, constipation, diarrhea, heartburn and melena.  Genitourinary: Negative.   Musculoskeletal: Negative.   Skin: Negative.   Neurological: Negative.   Endo/Heme/Allergies: Negative.   Psychiatric/Behavioral: Negative.      Physical Exam:  BP (!) 159/73   Pulse 86   Temp 97.9 F (36.6 C) (Oral)   Resp 20   Ht '4\' 11"'  (1.499 m)   Wt 134 lb (60.8 kg)   SpO2 92%   BMI 27.06 kg/m   Physical Exam  Constitutional: She is oriented to person, place, and time and well-developed, well-nourished, and in no distress. No distress.  HENT:  Head: Normocephalic and atraumatic.  Eyes: Pupils are equal, round, and reactive to light. Right eye exhibits no discharge. Left eye exhibits no discharge. No scleral icterus.  Neck: Normal range of motion. No JVD present.  Cardiovascular: Normal rate, regular rhythm and normal heart sounds.   Pulmonary/Chest: Effort normal and breath sounds normal. No respiratory distress. She has no wheezes. She has no rales.  Abdominal: Soft. She exhibits distension. There is tenderness. There is no rebound and no guarding.  Minimal tenderness maximal in the right lower quadrant no peritoneal signs no guarding rebound or percussion tenderness. A midline scar is noted. This is in the infraumbilical area. There is a question of a hernia in the right lower quadrant which is not completely reducible. This area is  tender but not peritoneal  Musculoskeletal: Normal range of motion. She exhibits no edema or tenderness.  Lymphadenopathy:    She has no cervical adenopathy.  Neurological: She is alert and oriented to person, place, and time.  Skin: Skin is warm and dry. No rash noted. She is not diaphoretic. No erythema. No pallor.  Psychiatric: Mood and affect normal.  Vitals reviewed.       Results for orders placed or performed during the hospital encounter of 06/02/17 (from the  past 48 hour(s))  Basic metabolic panel     Status: Abnormal   Collection Time: 06/02/17  4:23 AM  Result Value Ref Range   Sodium 135 135 - 145 mmol/L   Potassium 3.8 3.5 - 5.1 mmol/L   Chloride 97 (L) 101 - 111 mmol/L   CO2 26 22 - 32 mmol/L   Glucose, Bld 125 (H) 65 - 99 mg/dL   BUN 17 6 - 20 mg/dL   Creatinine, Ser 0.58 0.44 - 1.00 mg/dL   Calcium 8.6 (L) 8.9 - 10.3 mg/dL   GFR calc non Af Amer >60 >60 mL/min   GFR calc Af Amer >60 >60 mL/min    Comment: (NOTE) The eGFR has been calculated using the CKD EPI equation. This calculation has not been validated in all clinical situations. eGFR's persistently <60 mL/min signify possible Chronic Kidney Disease.    Anion gap 12 5 - 15  CBC     Status: Abnormal   Collection Time: 06/02/17  4:23 AM  Result Value Ref Range   WBC 6.3 3.6 - 11.0 K/uL   RBC 4.92 3.80 - 5.20 MIL/uL   Hemoglobin 11.8 (L) 12.0 - 16.0 g/dL   HCT 37.4 35.0 - 47.0 %   MCV 76.1 (L) 80.0 - 100.0 fL   MCH 24.1 (L) 26.0 - 34.0 pg   MCHC 31.6 (L) 32.0 - 36.0 g/dL   RDW 19.7 (H) 11.5 - 14.5 %   Platelets 431 150 - 440 K/uL  Lipase, blood     Status: None   Collection Time: 06/02/17  4:23 AM  Result Value Ref Range   Lipase 24 11 - 51 U/L  Troponin I     Status: None   Collection Time: 06/02/17  4:23 AM  Result Value Ref Range   Troponin I <0.03 <0.03 ng/mL  Lactic acid, plasma     Status: None   Collection Time: 06/02/17  4:23 AM  Result Value Ref Range   Lactic Acid, Venous 1.0 0.5 - 1.9 mmol/L  TSH     Status: None   Collection Time: 06/02/17  4:23 AM  Result Value Ref Range   TSH 0.754 0.350 - 4.500 uIU/mL    Comment: Performed by a 3rd Generation assay with a functional sensitivity of <=0.01 uIU/mL.  Urinalysis, Complete w Microscopic     Status: Abnormal   Collection Time: 06/02/17  5:07 AM  Result Value Ref Range   Color, Urine YELLOW (A) YELLOW   APPearance HAZY (A) CLEAR   Specific Gravity, Urine 1.019 1.005 - 1.030   pH 5.0 5.0 - 8.0    Glucose, UA NEGATIVE NEGATIVE mg/dL   Hgb urine dipstick NEGATIVE NEGATIVE   Bilirubin Urine NEGATIVE NEGATIVE   Ketones, ur 20 (A) NEGATIVE mg/dL   Protein, ur NEGATIVE NEGATIVE mg/dL   Nitrite NEGATIVE NEGATIVE   Leukocytes, UA TRACE (A) NEGATIVE   RBC / HPF 0-5 0 - 5 RBC/hpf   WBC, UA 0-5 0 - 5 WBC/hpf  Bacteria, UA RARE (A) NONE SEEN   Squamous Epithelial / LPF 0-5 (A) NONE SEEN   Mucous PRESENT    Ct Abdomen Pelvis W Contrast  Result Date: 06/02/2017 CLINICAL DATA:  Nausea vomiting and weakness for 3 days. EXAM: CT ABDOMEN AND PELVIS WITH CONTRAST TECHNIQUE: Multidetector CT imaging of the abdomen and pelvis was performed using the standard protocol following bolus administration of intravenous contrast. CONTRAST:  125m ISOVUE-300 IOPAMIDOL (ISOVUE-300) INJECTION 61% COMPARISON:  None. FINDINGS: Lower chest: Mild linear scarring or atelectasis in the lung bases. No consolidation. No effusion. Hepatobiliary: Mild fatty infiltration of the liver. No focal liver lesion. Gallbladder and bile ducts appear unremarkable. Pancreas: Unremarkable. No pancreatic ductal dilatation or surrounding inflammatory changes. Spleen: Normal in size without focal abnormality. Adrenals/Urinary Tract: 2.2 cm water density lesion of the left kidney midpole laterally, likely a benign cyst. No suspicious renal parenchymal lesions. Ureters and urinary bladder are unremarkable. Both adrenals are normal. Stomach/Bowel: Large hiatal hernia. Fluid distention of the distal esophagus, hiatal hernia and remainder of the stomach. Marked dilatation of small bowel. There is a high-grade small bowel obstruction within a Spigelian hernia of the abdominal right lower quadrant this is seen to best advantage on coronal image 26 series 5 where the abrupt caliber transition is seen as the small bowel reenters the abdomen from the hernia. It is also visible on axial image 58 series 2 and on sagittal images 38-41 series 6. Colon is  decompressed. Moderate colonic diverticulosis without evidence of diverticulitis. No extraluminal gas. Vascular/Lymphatic: The abdominal aorta is normal in caliber with moderate atherosclerotic calcification. No adenopathy in the abdomen or pelvis. Reproductive: Hysterectomy.  No adnexal abnormality. Other: No ascites. Musculoskeletal: No significant skeletal lesion. Compressions and vertebral augmentations at L1 and L2. IMPRESSION: 1. High-grade small bowel obstruction within a right lower quadrant Spigelian hernia 2. Colonic diverticulosis. 3. Large hiatal hernia. 4. Hepatic steatosis. 5. Aortic atherosclerosis. Electronically Signed   By: DAndreas NewportM.D.   On: 06/02/2017 06:32   Dg Abd Portable 1 View  Result Date: 06/02/2017 CLINICAL DATA:  Nasogastric tube placement. EXAM: PORTABLE ABDOMEN - 1 VIEW COMPARISON:  CT abdomen pelvis 06/02/2017. FINDINGS: Nasogastric tube is looped in the stomach, with the side port near the gastroesophageal junction. Gaseous distension of small bowel is seen with gas in the transverse colon. IV contrast is seen in the intrarenal collecting systems and bladder. Visualized lung bases are grossly clear. IMPRESSION: 1. Nasogastric tube is looped in the stomach. 2. Small bowel obstruction. Electronically Signed   By: MLorin PicketM.D.   On: 06/02/2017 07:29     Assessment/Plan  This a patient with an incarcerated ventral hernia. I have independently reviewed her history and physical exam as well as her abdominal films and CT scan films. I reviewed her laboratory values as well. She has a large hiatal hernia but has an incarcerated hernia in the right lower quadrant. It was read as big ganglion but with her history of multiple prior GYN surgeries and an appendectomy this could easily be related to a prior surgical incision of ventral hernia origin. There is a high-grade bowel obstruction. Currently her pain is much improved but still present. I have recommended  emergent surgery this morning to reduce the large loop of dilated bowel within this hernia sac and to repair the hernia. I discussed with her the potential for bowel obstruction or bowel injury and bowel resection. I also discussed with her the risks of bleeding infection recurrent hernia  the possibility of mesh utilization and the risk of mesh infection suggesting that repair with mesh is not the best choice in her this patient with an acute incarceration. This plan was discussed with the emergency room physician and with Dr. Dahlia Byes took the original phone call from the night shift emergency room physician who is now gone. The plan was discussed with nursing and the operating room staff. Over 1 hour was spent reviewing this information and managing this patient for preoperative evaluation and emergent surgery.  Florene Glen, MD, FACS

## 2017-06-02 NOTE — Progress Notes (Signed)
Pt is restless and agitated, states that she has not been able to her psychiatric meds for 4-5 days d/t nausea. She is requesting medication to help with her anxiety. Dr. Dahlia Byes, made aware, will monitor. Safety precautions in place.

## 2017-06-02 NOTE — Anesthesia Preprocedure Evaluation (Addendum)
Anesthesia Evaluation  Patient identified by MRN, date of birth, ID band Patient awake    Reviewed: Allergy & Precautions, NPO status , Patient's Chart, lab work & pertinent test results  History of Anesthesia Complications Negative for: history of anesthetic complications  Airway Mallampati: III  TM Distance: <3 FB Neck ROM: Full    Dental  (+) Partial Upper   Pulmonary neg sleep apnea, COPD,  COPD inhaler,    breath sounds clear to auscultation- rhonchi (-) wheezing      Cardiovascular Exercise Tolerance: Good (-) hypertension(-) CAD, (-) Past MI and (-) Cardiac Stents  Rhythm:Regular Rate:Normal - Systolic murmurs and - Diastolic murmurs    Neuro/Psych PSYCHIATRIC DISORDERS Anxiety Depression Bipolar Disorder negative neurological ROS     GI/Hepatic Neg liver ROS, GERD  ,Incarcerated hernia   Endo/Other  negative endocrine ROSneg diabetes  Renal/GU negative Renal ROS     Musculoskeletal  (+) Arthritis ,   Abdominal (+) - obese,   Peds  Hematology  (+) anemia ,   Anesthesia Other Findings Past Medical History: No date: Bipolar affective disorder ([facility]) No date: Depression No date: Glaucoma No date: Hyperlipidemia   Reproductive/Obstetrics                            Anesthesia Physical Anesthesia Plan  ASA: II and emergent  Anesthesia Plan: General   Post-op Pain Management:    Induction: Intravenous, Rapid sequence and Cricoid pressure planned  PONV Risk Score and Plan: 2 and Ondansetron and Dexamethasone  Airway Management Planned: Oral ETT  Additional Equipment:   Intra-op Plan:   Post-operative Plan: Extubation in OR  Informed Consent: I have reviewed the patients History and Physical, chart, labs and discussed the procedure including the risks, benefits and alternatives for the proposed anesthesia with the patient or authorized representative who has indicated  his/her understanding and acceptance.   Dental advisory given  Plan Discussed with: CRNA and Anesthesiologist  Anesthesia Plan Comments:        Anesthesia Quick Evaluation

## 2017-06-02 NOTE — Op Note (Signed)
   Pre-operative Diagnosis: Incarcerated ventral hernia  Post-operative Diagnosis: Incarcerated ventral hernia with bowel obstruction  Surgeon: Phoebe Perch   Assistants: Surgical tech  Anesthesia: Gen. with endotracheal tube    Procedure Details  The patient was seen again in the Holding Room. The benefits, complications, treatment options, and expected outcomes were discussed with the patient. The risks of bleeding, infection, recurrence of symptoms, failure to resolve symptoms,  bowel injury, any of which could require further surgery were reviewed with the patient. We discussed ventral hernia repair possibly with mesh but that with incarceration it is unlikely that mesh could be utilized. We discussed risk of Messick mesh infection and recurrence  The patient was taken to Operating Room, identified as Pamela Ball and the procedure verified.  A Time Out was held and the above information confirmed.  Prior to the induction of general anesthesia, antibiotic prophylaxis was administered. VTE prophylaxis was in place. General endotracheal anesthesia was then administered and tolerated well. After the induction, the abdomen was prepped with Chloraprep and draped in the sterile fashion. The patient was positioned in the supine position. A Foley catheter was placed by nursing and a nasogastric tube and then placed in the emergency room.  A midline incision was utilized to open and explore the abdominal cavity. Collapsed bowel and dilated small bowel were noticed immediately. Adhesions were taken down sharply and with electrocautery. In the right lower quadrant was an incarcerated ventral hernia which was fairly easily reduced with external pressure and traction. The bowel was not compromised. There was no sign of ischemia. Adhesions were taken down sharply to completely release the edges of the hernia. A large sac extended laterally and posteriorly along the right abdominal wall.  The  small bowel was run from the ligament of Treitz to the ileocecal valve. Some additional adhesions were taken down in order to facilitate this. No other abnormalities were noted with the exception of the transition zone which was patent. No ischemia was noted.  The ventral hernia was then repaired with figure-of-eight 0 Prolenes. This was done in an interrupted figure-of-eight fashion to completely repair the right lateral hernia. This was likely from a prior appendectomy incision. The bowel was again run and no sign of injury or ischemia was evident.  A piece of Seprafilm was placed after ensuring that the sponge lap needle count was correct. Running #1 PDS was utilized to close the abdominal wall over the Seprafilm. Marcaine for a total of 30 cc was placed in the subcutaneous tissues and skin staples were placed. A sterile dressing was utilized.  Patient out of the procedure well the workup occasions he was taken to recovery room in stable condition to be admitted for continued care.  Findings: Incarcerated ventral hernia with small bowel present repaired primarily.   Estimated Blood Loss: Minimal         Drains: None         Specimens: None       Complications: None                  Condition: Stable   Pamela Rust E. Burt Knack, MD, FACS

## 2017-06-02 NOTE — Anesthesia Post-op Follow-up Note (Cosign Needed)
Anesthesia QCDR form completed.        

## 2017-06-02 NOTE — ED Notes (Signed)
Pt's o2sat noted to be 90% on RA, placed on 2L via [place]

## 2017-06-02 NOTE — ED Notes (Signed)
Pt vomited while trying to drink contrast, Dr. Clearnce Hasten informed

## 2017-06-02 NOTE — ED Triage Notes (Signed)
Pt reports that she has been feeling weak since Wednesday and also experiencing N/V since the same time. Pt states that she does have a PCP but does not know his name and did not call for an appointment this week. Pt is able to move all four extremeties in triage and denies head trauma or LOC. Pt is in NAD at this time.

## 2017-06-02 NOTE — Anesthesia Postprocedure Evaluation (Signed)
Anesthesia Post Note  Patient: Pamela Ball  Procedure(s) Performed: Procedure(s) (LRB): exploratory laparotomy repair of incarcerated  VENTRAL hernia (N/A)  Patient location during evaluation: PACU Anesthesia Type: General Level of consciousness: awake and alert and oriented Pain management: pain level controlled Vital Signs Assessment: post-procedure vital signs reviewed and stable Respiratory status: spontaneous breathing, nonlabored ventilation and respiratory function stable Cardiovascular status: blood pressure returned to baseline and stable Postop Assessment: no signs of nausea or vomiting Anesthetic complications: no     Last Vitals:  Vitals:   06/02/17 1130 06/02/17 1143  BP: (!) 153/99 (!) 143/69  Pulse: (!) 103 98  Resp: 18 13  Temp:  37.1 C    Last Pain:  Vitals:   06/02/17 1112  TempSrc:   PainSc: Asleep                 Seraphine Gudiel

## 2017-06-03 ENCOUNTER — Encounter: Payer: Self-pay | Admitting: Surgery

## 2017-06-03 LAB — BASIC METABOLIC PANEL
Anion gap: 4 — ABNORMAL LOW (ref 5–15)
BUN: 7 mg/dL (ref 6–20)
CALCIUM: 7.5 mg/dL — AB (ref 8.9–10.3)
CO2: 28 mmol/L (ref 22–32)
CREATININE: 0.48 mg/dL (ref 0.44–1.00)
Chloride: 109 mmol/L (ref 101–111)
GFR calc Af Amer: >60 mL/min (ref >60)
Glucose, Bld: 159 mg/dL — ABNORMAL HIGH (ref 65–99)
POTASSIUM: 2.8 mmol/L — AB (ref 3.5–5.1)
SODIUM: 141 mmol/L (ref 135–145)

## 2017-06-03 LAB — CBC
HEMATOCRIT: 29.8 % — AB (ref 35.0–47.0)
Hemoglobin: 9.2 g/dL — ABNORMAL LOW (ref 12.0–16.0)
MCH: 23.7 pg — ABNORMAL LOW (ref 26.0–34.0)
MCHC: 30.8 g/dL — ABNORMAL LOW (ref 32.0–36.0)
MCV: 77 fL — ABNORMAL LOW (ref 80.0–100.0)
PLATELETS: 307 10*3/uL (ref 150–440)
RBC: 3.87 MIL/uL (ref 3.80–5.20)
RDW: 19.1 % — AB (ref 11.5–14.5)
WBC: 3.5 10*3/uL — AB (ref 3.6–11.0)

## 2017-06-03 LAB — GLUCOSE, CAPILLARY: GLUCOSE-CAPILLARY: 158 mg/dL — AB (ref 65–99)

## 2017-06-03 MED ORDER — POTASSIUM CHLORIDE CRYS ER 20 MEQ PO TBCR
20.0000 meq | EXTENDED_RELEASE_TABLET | Freq: Two times a day (BID) | ORAL | Status: DC
  Administered 2017-06-03 (×2): 20 meq via ORAL
  Filled 2017-06-03 (×2): qty 1

## 2017-06-03 NOTE — Progress Notes (Signed)
1 Day Post-Op  Subjective: Status post repair of an incarcerated ventral hernia with small bowel obstruction. Patient feels better today but has abdominal incisional pain. No nausea vomiting  Objective: Vital signs in last 24 hours: Temp:  [98.6 F (37 C)-99.7 F (37.6 C)] 99.1 F (37.3 C) (07/29 0557) Pulse Rate:  [91-107] 107 (07/29 0557) Resp:  [13-22] 20 (07/29 0557) BP: (132-160)/(52-99) 135/58 (07/29 0557) SpO2:  [92 %-98 %] 94 % (07/29 0557)    Intake/Output from previous day: 07/28 0701 - 07/29 0700 In: 3551.3 [I.V.:2521.3; NG/GT:30; IV Piggyback:1000] Out: 2130 [Urine:1850; Emesis/NG output:270; Blood:10] Intake/Output this shift: No intake/output data recorded.  Physical exam:  Wound is clean soft nontender abdomen except around the incision.  Lab Results: CBC   Recent Labs  06/02/17 0423 06/03/17 0405  WBC 6.3 3.5*  HGB 11.8* 9.2*  HCT 37.4 29.8*  PLT 431 307   BMET  Recent Labs  06/02/17 0423 06/03/17 0405  NA 135 141  K 3.8 2.8*  CL 97* 109  CO2 26 28  GLUCOSE 125* 159*  BUN 17 7  CREATININE 0.58 0.48  CALCIUM 8.6* 7.5*   PT/INR No results for input(s): LABPROT, INR in the last 72 hours. ABG No results for input(s): PHART, HCO3 in the last 72 hours.  Invalid input(s): PCO2, PO2  Studies/Results: Ct Abdomen Pelvis W Contrast  Result Date: 06/02/2017 CLINICAL DATA:  Nausea vomiting and weakness for 3 days. EXAM: CT ABDOMEN AND PELVIS WITH CONTRAST TECHNIQUE: Multidetector CT imaging of the abdomen and pelvis was performed using the standard protocol following bolus administration of intravenous contrast. CONTRAST:  152mL ISOVUE-300 IOPAMIDOL (ISOVUE-300) INJECTION 61% COMPARISON:  None. FINDINGS: Lower chest: Mild linear scarring or atelectasis in the lung bases. No consolidation. No effusion. Hepatobiliary: Mild fatty infiltration of the liver. No focal liver lesion. Gallbladder and bile ducts appear unremarkable. Pancreas: Unremarkable. No  pancreatic ductal dilatation or surrounding inflammatory changes. Spleen: Normal in size without focal abnormality. Adrenals/Urinary Tract: 2.2 cm water density lesion of the left kidney midpole laterally, likely a benign cyst. No suspicious renal parenchymal lesions. Ureters and urinary bladder are unremarkable. Both adrenals are normal. Stomach/Bowel: Large hiatal hernia. Fluid distention of the distal esophagus, hiatal hernia and remainder of the stomach. Marked dilatation of small bowel. There is a high-grade small bowel obstruction within a Spigelian hernia of the abdominal right lower quadrant this is seen to best advantage on coronal image 26 series 5 where the abrupt caliber transition is seen as the small bowel reenters the abdomen from the hernia. It is also visible on axial image 58 series 2 and on sagittal images 38-41 series 6. Colon is decompressed. Moderate colonic diverticulosis without evidence of diverticulitis. No extraluminal gas. Vascular/Lymphatic: The abdominal aorta is normal in caliber with moderate atherosclerotic calcification. No adenopathy in the abdomen or pelvis. Reproductive: Hysterectomy.  No adnexal abnormality. Other: No ascites. Musculoskeletal: No significant skeletal lesion. Compressions and vertebral augmentations at L1 and L2. IMPRESSION: 1. High-grade small bowel obstruction within a right lower quadrant Spigelian hernia 2. Colonic diverticulosis. 3. Large hiatal hernia. 4. Hepatic steatosis. 5. Aortic atherosclerosis. Electronically Signed   By: Andreas Newport M.D.   On: 06/02/2017 06:32   Dg Abd Portable 1 View  Result Date: 06/02/2017 CLINICAL DATA:  Nasogastric tube placement. EXAM: PORTABLE ABDOMEN - 1 VIEW COMPARISON:  CT abdomen pelvis 06/02/2017. FINDINGS: Nasogastric tube is looped in the stomach, with the side port near the gastroesophageal junction. Gaseous distension of small bowel is  seen with gas in the transverse colon. IV contrast is seen in the  intrarenal collecting systems and bladder. Visualized lung bases are grossly clear. IMPRESSION: 1. Nasogastric tube is looped in the stomach. 2. Small bowel obstruction. Electronically Signed   By: Lorin Picket M.D.   On: 06/02/2017 07:29    Anti-infectives: Anti-infectives    Start     Dose/Rate Route Frequency Ordered Stop   06/02/17 0930  ceFAZolin (ANCEF) IVPB 2g/100 mL premix     2 g 200 mL/hr over 30 Minutes Intravenous 30 min pre-op 06/02/17 0913        Assessment/Plan: s/p Procedure(s): exploratory laparotomy repair of incarcerated  VENTRAL hernia   We will replace her potassium today orally by D seeing the NG tube and a Foley catheter and starting oral KCl tablets. Recheck in the morning. Advance diet today.  Florene Glen, MD, FACS  06/03/2017

## 2017-06-03 NOTE — Progress Notes (Signed)
md was notified of pt. temp of 100.4. Order received to pt. out of bed.

## 2017-06-04 LAB — BASIC METABOLIC PANEL
ANION GAP: 4 — AB (ref 5–15)
BUN: 6 mg/dL (ref 6–20)
CALCIUM: 7.3 mg/dL — AB (ref 8.9–10.3)
CHLORIDE: 103 mmol/L (ref 101–111)
CO2: 29 mmol/L (ref 22–32)
Creatinine, Ser: 0.46 mg/dL (ref 0.44–1.00)
GFR calc non Af Amer: >60 mL/min (ref >60)
Glucose, Bld: 156 mg/dL — ABNORMAL HIGH (ref 65–99)
POTASSIUM: 2.5 mmol/L — AB (ref 3.5–5.1)
Sodium: 136 mmol/L (ref 135–145)

## 2017-06-04 MED ORDER — POTASSIUM CHLORIDE CRYS ER 20 MEQ PO TBCR
40.0000 meq | EXTENDED_RELEASE_TABLET | ORAL | Status: AC
  Administered 2017-06-04 (×2): 40 meq via ORAL
  Filled 2017-06-04 (×2): qty 2

## 2017-06-04 MED ORDER — OXYCODONE HCL 5 MG PO TABS
5.0000 mg | ORAL_TABLET | Freq: Four times a day (QID) | ORAL | Status: DC | PRN
  Administered 2017-06-04 – 2017-06-05 (×3): 10 mg via ORAL
  Administered 2017-06-06: 5 mg via ORAL
  Filled 2017-06-04: qty 1
  Filled 2017-06-04 (×3): qty 2

## 2017-06-04 MED ORDER — POTASSIUM CHLORIDE CRYS ER 20 MEQ PO TBCR: 40.0000 meq | EXTENDED_RELEASE_TABLET | Freq: Two times a day (BID) | ORAL | Status: DC

## 2017-06-04 MED ORDER — VENLAFAXINE HCL ER 75 MG PO CP24
75.0000 mg | ORAL_CAPSULE | Freq: Every day | ORAL | Status: DC
  Administered 2017-06-04 – 2017-06-06 (×3): 75 mg via ORAL
  Filled 2017-06-04 (×3): qty 1

## 2017-06-04 MED ORDER — BUPROPION HCL ER (XL) 150 MG PO TB24
150.0000 mg | ORAL_TABLET | Freq: Every day | ORAL | Status: DC
  Administered 2017-06-04 – 2017-06-06 (×3): 150 mg via ORAL
  Filled 2017-06-04 (×3): qty 1

## 2017-06-04 MED ORDER — BUSPIRONE HCL 10 MG PO TABS
10.0000 mg | ORAL_TABLET | Freq: Every day | ORAL | Status: DC
  Administered 2017-06-04 – 2017-06-06 (×3): 10 mg via ORAL
  Filled 2017-06-04 (×3): qty 1

## 2017-06-04 MED ORDER — OLANZAPINE 5 MG PO TABS
5.0000 mg | ORAL_TABLET | Freq: Every day | ORAL | Status: DC
  Administered 2017-06-04 – 2017-06-05 (×2): 5 mg via ORAL
  Filled 2017-06-04 (×3): qty 1

## 2017-06-04 NOTE — Progress Notes (Signed)
CRITICAL VALUE ALERT  Critical Value: Potassium 2.5  Date & Time Notied:  06/04/2017 0506  Provider Notified: Dr. Marcille Blanco  Orders Received/Actions taken: Administer ordered potassium and continue to monitor patient

## 2017-06-04 NOTE — Progress Notes (Signed)
06/04/2017  Subjective: Patient is 2 Days Post-Op s/p repair of incarcerated ventral hernia.  Denies any nausea or vomiting but no flatus yet.  Tolerating clears.  Vital signs: Temp:  [98.5 F (36.9 C)-99 F (37.2 C)] 99 F (37.2 C) (07/30 1234) Pulse Rate:  [95-99] 99 (07/30 1234) Resp:  [14-20] 14 (07/30 1234) BP: (122-137)/(45-65) 128/57 (07/30 1234) SpO2:  [97 %-98 %] 97 % (07/30 1234)   Intake/Output: 07/29 0701 - 07/30 0700 In: 5404 [P.O.:2280; I.V.:3124] Out: 850 [Urine:600; Emesis/NG output:250]    Physical Exam: Constitutional: No acute distress Abdomen: soft, nondistended, appropriately tender to palpation.  Incision is clean, dry, intact with staples in place.  Labs:   Recent Labs  06/02/17 0423 06/03/17 0405  WBC 6.3 3.5*  HGB 11.8* 9.2*  HCT 37.4 29.8*  PLT 431 307    Recent Labs  06/03/17 0405 06/04/17 0428  NA 141 136  K 2.8* 2.5*  CL 109 103  CO2 28 29  GLUCOSE 159* 156*  BUN 7 6  CREATININE 0.48 0.46  CALCIUM 7.5* 7.3*   No results for input(s): LABPROT, INR in the last 72 hours.  Imaging: No results found.  Assessment/Plan: 71 yo female s/p ventral hernia repair  --Advance diet to regular today --d/c IV fluids --ambulate, PT consult --replete potassium today   Melvyn Neth, [place]

## 2017-06-04 NOTE — Progress Notes (Signed)
PT Cancellation Note  Patient Details Name: Pamela Ball MRN: [id number] DOB: 1946/07/28   Cancelled Treatment:    Reason Eval/Treat Not Completed: Fatigue/lethargy limiting ability to participate (Consult received and chart reviewed. Patient notably fatigued, difficulty keeping eyes open upon initial contact.  Will re-attempt at later time/date as patient available and medically appropriate.)  Of note, patient most recent labs reveal K+ 2.5; however, has received replacement this AM.  Cyril Mourning H. Owens Shark, PT, DPT, NCS 06/04/17, 11:09 AM [phone]

## 2017-06-05 ENCOUNTER — Encounter: Payer: Self-pay | Admitting: *Deleted

## 2017-06-05 LAB — BASIC METABOLIC PANEL
ANION GAP: 5 (ref 5–15)
BUN: 9 mg/dL (ref 6–20)
CO2: 30 mmol/L (ref 22–32)
Calcium: 8.2 mg/dL — ABNORMAL LOW (ref 8.9–10.3)
Chloride: 102 mmol/L (ref 101–111)
Creatinine, Ser: 0.63 mg/dL (ref 0.44–1.00)
Glucose, Bld: 107 mg/dL — ABNORMAL HIGH (ref 65–99)
POTASSIUM: 3.8 mmol/L (ref 3.5–5.1)
SODIUM: 137 mmol/L (ref 135–145)

## 2017-06-05 LAB — URINALYSIS, COMPLETE (UACMP) WITH MICROSCOPIC
Bilirubin Urine: NEGATIVE
GLUCOSE, UA: NEGATIVE mg/dL
HGB URINE DIPSTICK: NEGATIVE
Ketones, ur: NEGATIVE mg/dL
Leukocytes, UA: NEGATIVE
Nitrite: POSITIVE — AB
PH: 6 (ref 5.0–8.0)
PROTEIN: NEGATIVE mg/dL
RBC / HPF: NONE SEEN RBC/hpf (ref 0–5)
Specific Gravity, Urine: 1.005 (ref 1.005–1.030)

## 2017-06-05 LAB — MAGNESIUM: MAGNESIUM: 1.8 mg/dL (ref 1.7–2.4)

## 2017-06-05 MED ORDER — SODIUM CHLORIDE 0.9 % IV SOLN
INTRAVENOUS | Status: DC
  Administered 2017-06-05: 18:00:00 via INTRAVENOUS

## 2017-06-05 NOTE — Care Management Important Message (Signed)
Important Message  Patient Details  Name: Finn Altemose MRN: [id number] Date of Birth: 29-Jan-1946   Medicare Important Message Given:  Yes    Beverly Sessions, RN 06/05/2017, 3:19 PM
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
## 2017-06-05 NOTE — NC FL2 (Signed)
[place] LEVEL OF CARE SCREENING TOOL     IDENTIFICATION  Patient Name: Pamela Ball Birthdate: 1946/09/17 Sex: female Admission Date (Current Location): 06/02/2017  [place] and [place] Number:  Engineering geologist and Address:  [facility], [address] [place], [place] [address]      Provider Number: [id number]  Attending Physician Name and Address:  Florene Glen, MD  Relative Name and Phone Number:       Current Level of Care: Hospital Recommended Level of Care: [place] Prior Approval Number:    Date Approved/Denied:   PASRR Number: [id number]  Discharge Plan: SNF    Current Diagnoses: Patient Active Problem List   Diagnosis Date Noted  . Incisional ventral hernia w obstruction 06/02/2017  . Abdominal hernia with obstruction and without gangrene   . Normocytic anemia 05/23/2017  . History of abdominal hernia 05/21/2017  . Bipolar 1 disorder, depressed, full remission ([place]) 02/15/2017  . GERD (gastroesophageal reflux disease) 02/15/2017  . COPD (chronic obstructive pulmonary disease) ([place]) 02/15/2017  . 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 Hyperlipidemia 02/15/2017  . Presbycusis of both ears 02/15/2017  . History of compression fracture of spine 02/15/2017  . Anxiety 02/15/2017    Orientation RESPIRATION BLADDER Height & Weight     Self, Time, Situation, Place  O2, Normal (3 liters) Continent Weight: 134 lb (60.8 kg) Height:  4\' 11"  (149.9 cm)  BEHAVIORAL SYMPTOMS/MOOD NEUROLOGICAL BOWEL NUTRITION STATUS   (none)   Continent Diet (regular)  AMBULATORY STATUS COMMUNICATION OF NEEDS Skin   Limited Assist Verbally Normal, Surgical wounds                       Personal Care Assistance Level of Assistance  Bathing, Dressing Bathing Assistance: Limited assistance   Dressing Assistance: Limited assistance     Functional Limitations Info             SPECIAL  CARE FACTORS FREQUENCY  PT (By licensed PT)                    Contractures Contractures Info: Not present    Additional Factors Info  Code Status, Allergies Code Status Info: full Allergies Info: codeine           Current Medications (06/05/2017):  This is the current hospital active medication list Current Facility-Administered Medications  Medication Dose Route Frequency Provider Last Rate Last Dose  . buPROPion (WELLBUTRIN XL) 24 hr tablet 150 mg  150 mg Oral Daily Piscoya, Jose, MD   150 mg at 06/05/17 1138  . busPIRone (BUSPAR) tablet 10 mg  10 mg Oral Daily Piscoya, Jose, MD   10 mg at 06/05/17 1138  . heparin injection 5,000 Units  5,000 Units Subcutaneous Q8H Florene Glen, MD   5,000 Units at 06/05/17 1424  . LORazepam (ATIVAN) injection 2 mg  2 mg Intravenous Q6H PRN Jules Husbands, MD   2 mg at 06/02/17 2116  . mometasone-formoterol (DULERA) 200-5 MCG/ACT inhaler 2 puff  2 puff Inhalation BID Florene Glen, MD   2 puff at 06/05/17 [phone]  . morphine 4 MG/ML injection 2 mg  2 mg Intravenous Q2H PRN Florene Glen, MD   2 mg at 06/04/17 1603  . OLANZapine (ZYPREXA) tablet 5 mg  5 mg Oral QHS Piscoya, Jose, MD   5 mg at 06/04/17 2154  . ondansetron (ZOFRAN) tablet 4 mg  4 mg Oral  Q6H PRN Florene Glen, MD       Or  . ondansetron [facility]) injection 4 mg  4 mg Intravenous Q6H PRN Florene Glen, MD      . oxyCODONE (Oxy IR/ROXICODONE) immediate release tablet 5-10 mg  5-10 mg Oral Q6H PRN Olean Ree, MD   10 mg at 06/05/17 [phone]  . venlafaxine XR (EFFEXOR-XR) 24 hr capsule 75 mg  75 mg Oral Q breakfast Piscoya, Jose, MD   75 mg at 06/05/17 0845  . ziprasidone (GEODON) injection 10 mg  10 mg Intramuscular Q6H PRN Pabon, Marjory Lies, MD         Discharge Medications: Please see discharge summary for a list of discharge medications.  Relevant Imaging Results:  Relevant Lab Results:   Additional Information ss: [id number]  Shela Leff, LCSW

## 2017-06-05 NOTE — Evaluation (Signed)
Physical Therapy Evaluation Patient Details Name: Pamela Ball MRN: [id number] DOB: 08/29/1946 Today's Date: 06/05/2017   History of Present Illness  Pamela Ball is a 71 yo F admittined to acute care on 7/28 with incisional ventral hernia with obstruction, which required surgical repair. Pamela Ball is currently post-op day 3. Prior to admission, Pamela Ball independent with all ADL's, using no AD. Pamela Ball wasnot driving prior to admission, and relied on husband to take her to appointment. PMH: bipolar affective disorder, depression, glaucoma, HLD, COPD, and anxiety.   Clinical Impression  Pamela Ball is pleasant, but highly lethargic. Pamela Ball performs bed mobility and tranfers with minA, and ambulation with min-modA +2 for safety, due to impaired strength, endurance, balance, pain, and lethargy. Pamela Ball amb total of 15 ft with B UE support from RW and chair follow for safety, as Pamela Ball was primarily limited by lethargy. Pamela Ball on 3 L O2 upon arrival; nursing requested qualifying Pamela Ball for home O2, having Pamela Ball on RA for treat. O2 sat at rest on RA was 87%; with supine therex, O2 sat 83%. Pamela Ball recovers well within 45 sec when reapplying 3L O2 and educating on pursed lipped breathing. Pamela Ball able to maintain O2 sat >90% with amb with 2L supplemental O2. Nursing notified of results of trial wean; Pamela Ball left on 2L supplemental O2 at end of session, per nursing request. Overall, Pamela Ball responded well to today's treatment with no adverse affects. Pamela Ball would benefit from skilled Pamela Ball to address the previously mentioned impairments and promote return to PLOF. Currently recommending SNF, pending d/c.      Follow Up Recommendations SNF    Equipment Recommendations  Rolling walker with 5" wheels    Recommendations for Other Services       Precautions / Restrictions Precautions Precautions: Fall Restrictions Weight Bearing Restrictions: No      Mobility  Bed Mobility Overal bed mobility: Needs Assistance Bed Mobility: Supine to Sit     Supine to sit: Min assist     General bed mobility comments: MinA with supine to sit, requiring mod verbal/visual/tactile cues to achieve task. Pamela Ball education re: log rolling technique to avoid excessive pain with rotation.   Transfers Overall transfer level: Needs assistance   Transfers: Sit to/from Stand Sit to Stand: Min assist         General transfer comment: MinA with STS, requiring min verbal cues for correct mechanics and safety. Pamela Ball on 2 L supplemental O2, maintaining saturation >90%. mild reports of dizziness upon standing.   Ambulation/Gait Ambulation/Gait assistance: Min assist;Mod assist Ambulation Distance (Feet): 15 Feet Assistive device: Rolling walker (2 wheeled) Gait Pattern/deviations: Step-through pattern     General Gait Details: Pamela Ball min-modA +2 for safety with amb, using RW for B UE support. Second person for chair follow, due to Pamela Ball lethargy. Pamela Ball highly lethargic throughout amb, requiring modA for obstacle navigation and mod cues for mechancis and safety. Primarily limited by fatigue. Pamela Ball on 2 L supplemental O2, maintaining sat >90%.  Stairs            Wheelchair Mobility    Modified Rankin (Stroke Patients Only)       Balance Overall balance assessment: Needs assistance Sitting-balance support: Bilateral upper extremity supported;Feet supported Sitting balance-Leahy Scale: Fair Sitting balance - Comments: Requires MinA with sitting balance, primarily due to lethargy, and Pamela Ball falling asleep while sitting up. increased postual sway in all directions noted. Pamela Ball able to sit with erect posture with verbal and tactile cues to stimulate alertness.    Standing balance support: Bilateral  upper extremity supported Standing balance-Leahy Scale: Fair Standing balance comment: Pamela Ball with fair standing balance, requiring B UE support from RW. Notable increase in postural sway and required verbal and tactile cues for maintaining alertness.                              Pertinent  Vitals/Pain Pain Assessment: Faces Faces Pain Scale: Hurts little more (Primarily with movement) Pain Location: abdomin  Pain Descriptors / Indicators: Discomfort;Operative site guarding;Grimacing Pain Intervention(s): Limited activity within patient's tolerance;Monitored during session;Premedicated before session    Home Living Family/patient expects to be discharged to:: Private residence Living Arrangements: Spouse/significant other Available Help at Discharge: Family Type of Home: House Home Access: Level entry     Home Layout: One level Home Equipment: None      Prior Function Level of Independence: Independent         Comments: Per Pamela Ball report: independent with no AD prior to admission, though does require asssit from spouse for IADL's, such as driving.      Hand Dominance        Extremity/Trunk Assessment   Upper Extremity Assessment Upper Extremity Assessment: Overall WFL for tasks assessed    Lower Extremity Assessment Lower Extremity Assessment: Generalized weakness (MMT to B LE's grossly 4-/5)       Communication   Communication: No difficulties  Cognition Arousal/Alertness: Lethargic Behavior During Therapy: Flat affect Overall Cognitive Status: Within Functional Limits for tasks assessed                                 General Comments: Pamela Ball highly lethargic throughout session, falling asleep multiple times while Pamela Ball was talking to Pamela Ball. Pamela Ball inconsistent with following one step command, due to lethargy.       General Comments      Exercises Other Exercises Other Exercises: Supine therex performed to B LE's with supervision: ankle pumps, glute sets, and hip abd. Pamela Ball on RA with initial therex: at rest Pamela Ball O2 sat at 87% and with therex, Pamela Ball O2 sat at 83%. PST reapplied 3 L supplemental )2 and educated on pursed lipped breathing. O2 saturation stabilized to >90% within 45 sec.    Assessment/Plan    Pamela Ball Assessment Patient needs continued Pamela Ball  services  Pamela Ball Problem List Decreased strength;Decreased activity tolerance;Decreased balance;Decreased mobility;Decreased coordination;Decreased safety awareness;Pain       Pamela Ball Treatment Interventions DME instruction;Gait training;Stair training;Functional mobility training;Therapeutic activities;Therapeutic exercise;Balance training;Neuromuscular re-education;Patient/family education    Pamela Ball Goals (Current goals can be found in the Care Plan section)  Acute Rehab Pamela Ball Goals Patient Stated Goal: to get stronger Pamela Ball Goal Formulation: With patient Time For Goal Achievement: 06/19/17 Potential to Achieve Goals: Good    Frequency Min 2X/week   Barriers to discharge        Co-evaluation               AM-PAC Pamela Ball "6 Clicks" Daily Activity  Outcome Measure Difficulty turning over in bed (including adjusting bedclothes, sheets and blankets)?: Total Difficulty moving from lying on back to sitting on the side of the bed? : Total Difficulty sitting down on and standing up from a chair with arms (e.g., wheelchair, bedside commode, etc,.)?: Total Help needed moving to and from a bed to chair (including a wheelchair)?: A Little Help needed walking in hospital room?: A Little Help needed climbing 3-5 steps with a railing? :  Total 6 Click Score: 10    End of Session Equipment Utilized During Treatment: Gait belt;Oxygen Activity Tolerance: Patient limited by lethargy Patient left: in chair;with call bell/phone within reach;with chair alarm set Nurse Communication: Mobility status Pamela Ball Visit Diagnosis: Unsteadiness on feet (R26.81);Other abnormalities of gait and mobility (R26.89);Muscle weakness (generalized) (M62.81);Pain    Time: 0488-8916 Pamela Ball Time Calculation (min) (ACUTE ONLY): 30 min   Charges:         Pamela Ball G Codes:        Pamela Ball Pamela Ball, Pamela Ball  Pamela Ball 06/05/2017, 12:36 PM

## 2017-06-06 DIAGNOSIS — K430 Incisional hernia with obstruction, without gangrene: Secondary | ICD-10-CM | POA: Diagnosis not present

## 2017-06-06 DIAGNOSIS — K219 Gastro-esophageal reflux disease without esophagitis: Secondary | ICD-10-CM | POA: Diagnosis not present

## 2017-06-06 DIAGNOSIS — M6281 Muscle weakness (generalized): Secondary | ICD-10-CM | POA: Diagnosis not present

## 2017-06-06 DIAGNOSIS — R6889 Other general symptoms and signs: Secondary | ICD-10-CM | POA: Diagnosis not present

## 2017-06-06 DIAGNOSIS — J449 Chronic obstructive pulmonary disease, unspecified: Secondary | ICD-10-CM | POA: Diagnosis not present

## 2017-06-06 DIAGNOSIS — M170 Bilateral primary osteoarthritis of knee: Secondary | ICD-10-CM | POA: Diagnosis not present

## 2017-06-06 DIAGNOSIS — Z7401 Bed confinement status: Secondary | ICD-10-CM | POA: Diagnosis not present

## 2017-06-06 DIAGNOSIS — R2681 Unsteadiness on feet: Secondary | ICD-10-CM | POA: Diagnosis not present

## 2017-06-06 DIAGNOSIS — R296 Repeated falls: Secondary | ICD-10-CM | POA: Diagnosis not present

## 2017-06-06 LAB — CBC
HEMATOCRIT: 27.9 % — AB (ref 35.0–47.0)
Hemoglobin: 8.6 g/dL — ABNORMAL LOW (ref 12.0–16.0)
MCH: 23.7 pg — ABNORMAL LOW (ref 26.0–34.0)
MCHC: 31 g/dL — ABNORMAL LOW (ref 32.0–36.0)
MCV: 76.4 fL — AB (ref 80.0–100.0)
Platelets: 242 10*3/uL (ref 150–440)
RBC: 3.65 MIL/uL — AB (ref 3.80–5.20)
RDW: 19.5 % — ABNORMAL HIGH (ref 11.5–14.5)
WBC: 5.6 10*3/uL (ref 3.6–11.0)

## 2017-06-06 MED ORDER — OXYCODONE HCL 5 MG PO TABS: 5.0000 mg | ORAL_TABLET | Freq: Four times a day (QID) | ORAL | 0 refills | Status: DC | PRN

## 2017-06-06 MED ORDER — CIPROFLOXACIN HCL 500 MG PO TABS
500.0000 mg | ORAL_TABLET | Freq: Two times a day (BID) | ORAL | Status: DC
  Administered 2017-06-06: 500 mg via ORAL
  Filled 2017-06-06: qty 1

## 2017-06-06 MED ORDER — POLYETHYLENE GLYCOL 3350 17 G PO PACK
17.0000 g | PACK | Freq: Every day | ORAL | Status: DC
  Administered 2017-06-06: 17 g via ORAL
  Filled 2017-06-06: qty 1

## 2017-06-06 MED ORDER — POLYETHYLENE GLYCOL 3350 17 G PO PACK: 17.0000 g | PACK | Freq: Every day | ORAL | 0 refills | Status: DC

## 2017-06-06 MED ORDER — CIPROFLOXACIN HCL 500 MG PO TABS: 500.0000 mg | ORAL_TABLET | Freq: Two times a day (BID) | ORAL | 0 refills | Status: AC

## 2017-06-06 NOTE — Discharge Summary (Signed)
Patient ID: Pamela Ball MRN: [id number] DOB/AGE: 03-31-1946 71 y.o.  Admit date: 06/02/2017 Discharge date: 06/06/2017   Discharge Diagnoses:  Active Problems:   Incisional ventral hernia w obstruction   Procedures:  Incarcerated ventral hernia repair  Hospital Course:  Patient was admitted on 7/28 with a bowel obstruction secondary to an incarcerated ventral hernia.  She was taken to the OR on 7/28 and had a hernia repair with Dr. Burt Knack.  No bowel needed to be resected.  Post-operatively, she was transferred to floor.  Her NG tube was removed on POD#1 and was started on clears.  Her diet was slowly advanced as tolerated and she had return of bowel function.  She was voiding without issues.  Her midline incision had a thin area of opening in the inferior portion with subcutaneous tissue exposed, but no evidence of other dehiscence.  She had an abdominal binder on.  PT evaluated the patient and deemed the patient a SNF candidate for discharge.  She was on low O2 via nasal canula due to some atelectasis, and she was having some difficulty with her incentive spirometer gadget, but was using it.  She is currently stable and deemed appropriate for discharge to SNF.  Consults: PT, SW, CM  Disposition: 03-Skilled Nursing Facility  Discharge Instructions    Call MD for:  difficulty breathing, headache or visual disturbances    Complete by:  As directed    Call MD for:  persistant nausea and vomiting    Complete by:  As directed    Call MD for:  redness, tenderness, or signs of infection (pain, swelling, redness, odor or green/yellow discharge around incision site)    Complete by:  As directed    Call MD for:  severe uncontrolled pain    Complete by:  As directed    Call MD for:  temperature >100.4    Complete by:  As directed    Change dressing (specify)    Complete by:  As directed    Apply dry gauze to the midline incision to cover the wound and staples.  Secure with tape.  Then  cover with abdominal binder.  Change dressing once daily.   Diet - low sodium heart healthy    Complete by:  As directed    Discharge instructions    Complete by:  As directed    1.  Patient may shower, but do not scrub wound heavily and dab dry only. 2.  Do not submerge the wound in pool/tub. 3.  Do not remove staples 4.  Wear abdominal binder at all times.  Please make sure that the binder is not riding up to the chest. 5.  Encourage and teach patient to use the Incentive spirometer and wean O2 as able to room air.   Driving Restrictions    Complete by:  As directed    Do not drive while taking narcotics for pain control.   Increase activity slowly    Complete by:  As directed    Lifting restrictions    Complete by:  As directed    No heavy lifting or pushing of more than 10-15 lbs for 4 weeks.     Allergies as of 06/06/2017      Reactions   Codeine Other (See Comments)   Reaction: unknown Hasn't had a reaction in years      Medication List    TAKE these medications   buPROPion 150 MG 24 hr tablet Commonly known as:  WELLBUTRIN XL  Take 150 mg by mouth.   busPIRone 10 MG tablet Commonly known as:  BUSPAR Take 1 tablet (10 mg total) by mouth daily.   ciprofloxacin 500 MG tablet Commonly known as:  CIPRO Take 1 tablet (500 mg total) by mouth 2 (two) times daily.   Fluticasone-Salmeterol 250-50 MCG/DOSE Aepb Commonly known as:  ADVAIR Inhale 1 puff into the lungs 2 (two) times daily.   OLANZapine 5 MG tablet Commonly known as:  ZYPREXA Take 5 mg by mouth at bedtime.   omeprazole 20 MG capsule Commonly known as:  PRILOSEC Take 20 mg by mouth daily.   oxyCODONE 5 MG immediate release tablet Commonly known as:  Oxy IR/ROXICODONE Take 1-2 tablets (5-10 mg total) by mouth every 6 (six) hours as needed for moderate pain or severe pain.   polyethylene glycol packet Commonly known as:  MIRALAX / GLYCOLAX Take 17 g by mouth daily.   simvastatin 20 MG tablet Commonly  known as:  ZOCOR Take 1 tablet (20 mg total) by mouth every evening.   venlafaxine XR 75 MG 24 hr capsule Commonly known as:  EFFEXOR-XR Take 75 mg by mouth daily with breakfast.       Contact information for follow-up providers    Florene Glen, MD Follow up in 1 week(s).   Specialty:  Surgery Why:  For possible staple removal. Contact information: [place] [address] [phone]            Contact information for after-discharge care    Destination    [facility] SNF/ALF Follow up.   Specialty:  [place] information: [address] [phone]

## 2017-06-06 NOTE — Clinical Social Work Placement (Signed)
   CLINICAL SOCIAL WORK PLACEMENT  NOTE  Date:  06/06/2017  Patient Details  Name: Pamela Ball MRN: [id number] Date of Birth: Mar 02, 1946  Clinical Social Work is seeking post-discharge placement for this patient at the [place] level of care (*CSW will initial, date and re-position this form in  chart as items are completed):  Yes   Patient/family provided with [place] Work Department's list of facilities offering this level of care within the geographic area requested by the patient (or if unable, by the patient's family).  Yes   Patient/family informed of their freedom to choose among providers that offer the needed level of care, that participate in Medicare, Medicaid or managed care program needed by the patient, have an available bed and are willing to accept the patient.  Yes   Patient/family informed of [place]'s ownership interest in [facility] and [facility], as well as of the fact that they are under no obligation to receive care at these facilities.  PASRR submitted to EDS on       PASRR number received on       Existing PASRR number confirmed on 06/05/17     FL2 transmitted to all facilities in geographic area requested by pt/family on 06/05/17     FL2 transmitted to all facilities within larger geographic area on       Patient informed that his/her managed care company has contracts with or will negotiate with certain facilities, including the following:        Yes   Patient/family informed of bed offers received.  Patient chooses bed at  [facility])     Physician recommends and patient chooses bed at  [facility])    Patient to be transferred to  [facility]) on 06/06/17.  Patient to be transferred to facility by  (EMS)     Patient family notified on 06/06/17 of transfer.  Name of family member notified:   (husband)     PHYSICIAN       Additional Comment:     _______________________________________________ Shela Leff, LCSW 06/06/2017, 3:52 PM

## 2017-06-06 NOTE — Progress Notes (Signed)
Report called to [facility] at [place].  Patient being transported via EMS

## 2017-06-06 NOTE — Progress Notes (Signed)
Patient sating in the 80's on room air.  No BM since 7/27.  Orders received from Dr Hampton Abbot for miralax and oxygen

## 2017-06-06 NOTE — Plan of Care (Signed)
Problem: Activity: Goal: Mobility will improve Outcome: Progressing Pt up to [facility] with 1 assist, tolerating well.  Problem: Bowel/Gastric: Goal: Gastrointestinal status for postoperative course will improve Outcome: Progressing No BM since before surgery, pt is passing gas though  Problem: Pain Managment: Goal: General experience of comfort will improve Outcome: Progressing Pt complained of pain once at the end of shift, pain is back and abdomen, 1 oxycodone given. Will continue to monitor.

## 2017-06-06 NOTE — Progress Notes (Signed)
06/06/2017  Subjective: Patient is 4 Days Post-Op s/p ventral hernia repair.  No acute events overnight.  Patient tolerating a regular diet but reports low appetite and feeling full early.  Has been having flatus but no BM yet.  Vital signs: Temp:  [97.5 F (36.4 C)-101.2 F (38.4 C)] 99 F (37.2 C) (08/01 1205) Pulse Rate:  [98-114] 104 (08/01 1205) Resp:  [18-20] 20 (08/01 0527) BP: (117-141)/(58-111) 141/59 (08/01 1205) SpO2:  [84 %-100 %] 94 % (08/01 1205)   Intake/Output: 07/31 0701 - 08/01 0700 In: 815.5 [P.O.:240; I.V.:575.5] Out: 2075 [Urine:2075] Last BM Date: 06/01/17  Physical Exam: Constitutional: No acute distress Abdomen:  Soft, nondistended, appropriately tender to palpation.  Incision is clean, dry, with staples in place.  The lower portion of the wound has opened, revealing subcutaneous fat.  Abdominal binder in place.  Labs:   Recent Labs  06/06/17 0328  WBC 5.6  HGB 8.6*  HCT 27.9*  PLT 242    Recent Labs  06/04/17 0428 06/05/17 0347  NA 136 137  K 2.5* 3.8  CL 103 102  CO2 29 30  GLUCOSE 156* 107*  BUN 6 9  CREATININE 0.46 0.63  CALCIUM 7.3* 8.2*   No results for input(s): LABPROT, INR in the last 72 hours.  Imaging: No results found.  Assessment/Plan: 71 yo female s/p ventral hernia repair.  --continue regular diet. --U/A done yesterday showing positive nitrite.  Will start 3 day course of cipro for UTI. --possible discharge to SNF today pending bed availability.   Melvyn Neth, [place]

## 2017-06-06 NOTE — Clinical Social Work Note (Signed)
Patient and husband have decided upon [facility] today and physician is ready to discharge. CSW informed Liliane Channel at [facility] and sent discharge information. Nurse to call report. [name] is aware of discharge and in agreement with EMS transport. Shela Leff MSW,LCSW [phone]

## 2017-06-12 ENCOUNTER — Other Ambulatory Visit: Payer: Self-pay | Admitting: Family Medicine

## 2017-06-13 ENCOUNTER — Ambulatory Visit (INDEPENDENT_AMBULATORY_CARE_PROVIDER_SITE_OTHER): Payer: Medicare Other | Admitting: General Surgery

## 2017-06-13 VITALS — BP 137/88 | HR 119 | Temp 98.3°F | Ht 59.0 in | Wt 123.6 lb

## 2017-06-13 DIAGNOSIS — Z4889 Encounter for other specified surgical aftercare: Secondary | ICD-10-CM

## 2017-06-14 ENCOUNTER — Encounter: Payer: Self-pay | Admitting: General Surgery

## 2017-06-14 NOTE — Patient Instructions (Signed)
Please see your follow up appointment listed above. Please call our office if you have questions or concerns.

## 2017-06-14 NOTE — Progress Notes (Signed)
Outpatient Surgical Follow Up  06/14/2017  Pamela Ball is an 71 y.o. female.   Chief Complaint  Patient presents with  . Routine Post Op    Ventral Hernia Repair-06-02-17-Dr.Cooper    HPI: 71 year old female returns to clinic for follow-up from a ventral hernia repair. Patient reports that some of her staples pulled apart from the lower aspect of the wound. Her pain is currently well controlled. She is tolerating a diet and having bowel function. She denies any fevers, chills, nausea, vomiting, chest pain, shortness of breath.  Past Medical History:  Diagnosis Date  . Abdominal hernia with obstruction and without gangrene   . Anxiety 02/15/2017  . Bipolar 1 disorder, depressed, full remission ([place]) 02/15/2017  . Bipolar affective disorder ([place])   . COPD (chronic obstructive pulmonary disease) ([place]) 02/15/2017  . Depression   . GERD (gastroesophageal reflux disease) 02/15/2017  . Glaucoma   . History of abdominal hernia 05/21/2017  . History of compression fracture of spine 02/15/2017  . Hyperlipidemia   . Incisional ventral hernia w obstruction 06/02/2017  . Normocytic anemia 05/23/2017  . 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 Presbycusis of both ears 02/15/2017    Past Surgical History:  Procedure Laterality Date  . ABDOMINAL HYSTERECTOMY  11/06/2006  . KYPHOPLASTY    . VENTRAL HERNIA REPAIR N/A 06/02/2017   Procedure: exploratory laparotomy repair of incarcerated  VENTRAL hernia;  Surgeon: Florene Glen, MD;  Location: [facility] ORS;  Service: General;  Laterality: N/A;    Family History  Problem Relation Age of Onset  . Cancer Mother        cancer    Social History:  reports that she has never smoked. She has never used smokeless tobacco. She reports that she does not drink alcohol or use drugs.  Allergies:  Allergies  Allergen Reactions  . Codeine Other (See Comments)    Reaction: unknown Hasn't had a reaction in years    Medications  reviewed.    ROS A multipoint review of systems was completed, all pertinent positives and negatives are documented within the history of present illness and the remainder are negative   BP 137/88   Pulse (!) 119   Temp 98.3 F (36.8 C) (Oral)   Ht 4\' 11"  (1.499 m)   Wt 56.1 kg (123 lb 9.6 oz)   BMI 24.96 kg/m   Physical Exam Gen.: No acute distress Chest: Clear to auscultation  heart: Tachycardic Abdomen: Soft, nontender, nondistended. Midline staples in place with approximately 4 cm of the lower aspect of the wound where the skin has been disrupted. No evidence of spreading erythema or purulence.    No results found for this or any previous visit (from the past 48 hour(s)). No results found.  Assessment/Plan:  1. Aftercare following surgery 71 year old female status post open ventral hernia repair. Staples removed today without any difficulty. The areas for the skin was still approximated were reinforced with Steri-Strips. There is a risk and has opened discussed doing local wound care with the patient. She voiced understanding and will follow-up with her operative surgeon next week.     Clayburn Pert, MD FACS General Surgeon  06/14/2017,11:45 AM

## 2017-06-18 ENCOUNTER — Ambulatory Visit (INDEPENDENT_AMBULATORY_CARE_PROVIDER_SITE_OTHER): Payer: Medicare Other | Admitting: Surgery

## 2017-06-18 ENCOUNTER — Encounter: Payer: Self-pay | Admitting: Surgery

## 2017-06-18 VITALS — BP 136/85 | HR 105 | Temp 98.4°F | Ht 59.0 in | Wt 123.8 lb

## 2017-06-18 DIAGNOSIS — Z4889 Encounter for other specified surgical aftercare: Secondary | ICD-10-CM

## 2017-06-18 NOTE — Patient Instructions (Signed)
We will see you back in the office in 2 weeks. Please see appointment below.  Your wound is healing nicely. We have put silver nitrate( Medication) on this area today. Keep a dressing over this until it is done draining. This medication will turn your clothes black or dark green.

## 2017-06-18 NOTE — Progress Notes (Signed)
Outpatient postop visit  06/18/2017  Pamela Ball is an 71 y.o. female.    Procedure: Repair of incarcerated ventral hernia  GB:EEFE  HPI: This patient underwent the emergent repair of an incarcerated ventral hernia probably from a appendectomy site. No mesh was utilized. She states that she is weak and is not eating very well but on further questioning asking what she had for breakfast and what she had last night she is eating a full meal both of those meals. She had a ham biscuit this morning and a sandwich and a salad last night of which she consumed 100%. Not lost any weight. Medications reviewed.    Physical Exam:  There were no vitals taken for this visit.    PE: Wounds clean no erythema no drainage 1 area of open granulation tissue which is touched with silver nitrate.    Assessment/Plan:  Patient doing very well recommend follow up in a 2 weeks for reevaluation of the small area of granulation tissue.  Florene Glen, MD, FACS

## 2017-06-25 ENCOUNTER — Other Ambulatory Visit: Payer: Self-pay | Admitting: Family Medicine

## 2017-06-25 DIAGNOSIS — E782 Mixed hyperlipidemia: Secondary | ICD-10-CM

## 2017-07-03 ENCOUNTER — Encounter: Payer: Self-pay | Admitting: Surgery

## 2017-07-11 ENCOUNTER — Telehealth: Payer: Self-pay | Admitting: General Practice

## 2017-07-11 ENCOUNTER — Encounter: Payer: Self-pay | Admitting: Surgery

## 2017-07-11 NOTE — Telephone Encounter (Signed)
Left a voicemail for the patient to call the office to r/s no showed appointment on 07/11/17 If the patient calls back please r/s this appointment.

## 2017-08-13 ENCOUNTER — Encounter: Payer: Self-pay | Admitting: Family Medicine

## 2017-08-13 ENCOUNTER — Ambulatory Visit (INDEPENDENT_AMBULATORY_CARE_PROVIDER_SITE_OTHER): Payer: Medicare Other | Admitting: Family Medicine

## 2017-08-13 VITALS — BP 129/68 | HR 76 | Temp 98.1°F | Ht 59.0 in | Wt 124.4 lb

## 2017-08-13 DIAGNOSIS — R350 Frequency of micturition: Secondary | ICD-10-CM | POA: Diagnosis not present

## 2017-08-13 DIAGNOSIS — R8290 Unspecified abnormal findings in urine: Secondary | ICD-10-CM | POA: Diagnosis not present

## 2017-08-13 DIAGNOSIS — D171 Benign lipomatous neoplasm of skin and subcutaneous tissue of trunk: Secondary | ICD-10-CM | POA: Diagnosis not present

## 2017-08-13 DIAGNOSIS — K219 Gastro-esophageal reflux disease without esophagitis: Secondary | ICD-10-CM

## 2017-08-13 DIAGNOSIS — Z23 Encounter for immunization: Secondary | ICD-10-CM

## 2017-08-13 MED ORDER — OMEPRAZOLE 20 MG PO CPDR: 20.0000 mg | DELAYED_RELEASE_CAPSULE | Freq: Every day | ORAL | 11 refills | Status: DC

## 2017-08-13 NOTE — Progress Notes (Signed)
Error

## 2017-08-13 NOTE — Progress Notes (Signed)
Subjective:    Patient ID: Pamela Ball, female    DOB: 06-30-1946, 71 y.o.   MRN: [id number]  Pamela Ball is a 71 y.o. female presenting on 08/13/2017 for Edema (pt complains of some swelling in her right flank area x 3 mths. odor and frequent urination x 6mths )   HPI   Right Low Back Knot - Reports present for 3 months, unsure onset, describes it as a "knot in muscle", it is non painful, no changes since that time. She thinks it may have been there longer but just noticed it. She is asking about if it could be related to her kidney or recent abdominal hernia. - Not taking any medicines for it - Denies any muscle strain, trauma, fall, pain, redness, swelling, numbness, tingling weakness  UTI - Reports symptoms present >3 months. Describes urinary frequency, urinary odor - Drinks mostly water, frequently and attributes some frequency to drinking water - History of UTI before - Denies fevers/chills, sweats, hematuria, dark urine, dysuria  GERD - due for refill Omeprazole, seems well controlled, has missed few doses now out of med, with some mild recurrence of symptoms  Additional PMH - abdominal incarcerated ventral hernia s/p hospitalization and repair on 06/02/17  Health Maintenance: - Due for Flu Shot, will receive today  Depression screen [facility] 2/9 05/21/2017 02/15/2017  Decreased Interest 1 0  Down, Depressed, Hopeless 1 0  PHQ - 2 Score 2 0  Altered sleeping 3 0  Tired, decreased energy 0 3  Change in appetite 1 0  Feeling bad or failure about yourself  1 0  Trouble concentrating 1 1  Moving slowly or fidgety/restless 0 1  Suicidal thoughts 0 0  PHQ-9 Score 8 5  Difficult doing work/chores - Not difficult at all    Social History  Substance Use Topics  . Smoking status: Never Smoker  . Smokeless tobacco: Never Used  . Alcohol use No    Review of Systems Per HPI unless specifically indicated above     Objective:    BP 129/68 (BP Location: Left  Arm, Patient Position: Sitting, Cuff Size: Normal)   Pulse 76   Temp 98.1 F (36.7 C) (Oral)   Ht 4\' 11"  (1.499 m)   Wt 124 lb 6.4 oz (56.4 kg)   BMI 25.13 kg/m   Wt Readings from Last 3 Encounters:  08/13/17 124 lb 6.4 oz (56.4 kg)  06/18/17 123 lb 12.8 oz (56.2 kg)  06/14/17 123 lb 9.6 oz (56.1 kg)    Physical Exam  Constitutional: She is oriented to person, place, and time. She appears well-developed and well-nourished. No distress.  Well-appearing, comfortable, cooperative  HENT:  Head: Normocephalic and atraumatic.  Mouth/Throat: Oropharynx is clear and moist.  Eyes: Conjunctivae are normal. Right eye exhibits no discharge. Left eye exhibits no discharge.  Neck: Normal range of motion. Neck supple. No thyromegaly present.  Cardiovascular: Normal rate, regular rhythm, normal heart sounds and intact distal pulses.   No murmur heard. Pulmonary/Chest: Effort normal and breath sounds normal. No respiratory distress. She has no wheezes. She has no rales.  Abdominal: Soft. Bowel sounds are normal. She exhibits no distension and no mass. There is no tenderness.  No palpable bladder fullness or abnormality lower abdomen  Musculoskeletal: Normal range of motion. She exhibits no edema.  Low / Mid Back Inspection: Normal appearance, no spinal deformity, symmetrical. Palpation: No tenderness over spinous processes. Bilateral lumbar paraspinal muscles non-tender - Localized moderate sized palpable knot somewhat  mobile deeper near R lower lumbar musculature, with some hypertonicity/spasm. ROM: Full active ROM forward flex / back extension, rotation L/R without discomfort Strength: Bilateral hip flex/ext 5/5, knee flex/ext 5/5, ankle dorsiflex/plantarflex 5/5 Neurovascular: intact distal sensation to light touch  Lymphadenopathy:    She has no cervical adenopathy.  Neurological: She is alert and oriented to person, place, and time.  Skin: Skin is warm and dry. No rash noted. She is not  diaphoretic. No erythema.  Psychiatric: She has a normal mood and affect. Her behavior is normal.  Well groomed, good eye contact, normal speech and thoughts  Nursing note and vitals reviewed.  Results for orders placed or performed during the hospital encounter of 25/95/63  Basic metabolic panel  Result Value Ref Range   Sodium 135 135 - 145 mmol/L   Potassium 3.8 3.5 - 5.1 mmol/L   Chloride 97 (L) 101 - 111 mmol/L   CO2 26 22 - 32 mmol/L   Glucose, Bld 125 (H) 65 - 99 mg/dL   BUN 17 6 - 20 mg/dL   Creatinine, Ser 0.58 0.44 - 1.00 mg/dL   Calcium 8.6 (L) 8.9 - 10.3 mg/dL   GFR calc non Af Amer >60 >60 mL/min   GFR calc Af Amer >60 >60 mL/min   Anion gap 12 5 - 15  CBC  Result Value Ref Range   WBC 6.3 3.6 - 11.0 K/uL   RBC 4.92 3.80 - 5.20 MIL/uL   Hemoglobin 11.8 (L) 12.0 - 16.0 g/dL   HCT 37.4 35.0 - 47.0 %   MCV 76.1 (L) 80.0 - 100.0 fL   MCH 24.1 (L) 26.0 - 34.0 pg   MCHC 31.6 (L) 32.0 - 36.0 g/dL   RDW 19.7 (H) 11.5 - 14.5 %   Platelets 431 150 - 440 K/uL  Urinalysis, Complete w Microscopic  Result Value Ref Range   Color, Urine YELLOW (A) YELLOW   APPearance HAZY (A) CLEAR   Specific Gravity, Urine 1.019 1.005 - 1.030   pH 5.0 5.0 - 8.0   Glucose, UA NEGATIVE NEGATIVE mg/dL   Hgb urine dipstick NEGATIVE NEGATIVE   Bilirubin Urine NEGATIVE NEGATIVE   Ketones, ur 20 (A) NEGATIVE mg/dL   Protein, ur NEGATIVE NEGATIVE mg/dL   Nitrite NEGATIVE NEGATIVE   Leukocytes, UA TRACE (A) NEGATIVE   RBC / HPF 0-5 0 - 5 RBC/hpf   WBC, UA 0-5 0 - 5 WBC/hpf   Bacteria, UA RARE (A) NONE SEEN   Squamous Epithelial / LPF 0-5 (A) NONE SEEN   Mucus PRESENT   Lipase, blood  Result Value Ref Range   Lipase 24 11 - 51 U/L  Troponin I  Result Value Ref Range   Troponin I <0.03 <0.03 ng/mL  Lactic acid, plasma  Result Value Ref Range   Lactic Acid, Venous 1.0 0.5 - 1.9 mmol/L  TSH  Result Value Ref Range   TSH 0.754 0.350 - 4.500 uIU/mL  Basic metabolic panel  Result Value Ref  Range   Sodium 141 135 - 145 mmol/L   Potassium 2.8 (L) 3.5 - 5.1 mmol/L   Chloride 109 101 - 111 mmol/L   CO2 28 22 - 32 mmol/L   Glucose, Bld 159 (H) 65 - 99 mg/dL   BUN 7 6 - 20 mg/dL   Creatinine, Ser 0.48 0.44 - 1.00 mg/dL   Calcium 7.5 (L) 8.9 - 10.3 mg/dL   GFR calc non Af Amer >60 >60 mL/min   GFR calc Af Amer >60 >  60 mL/min   Anion gap 4 (L) 5 - 15  CBC  Result Value Ref Range   WBC 3.5 (L) 3.6 - 11.0 K/uL   RBC 3.87 3.80 - 5.20 MIL/uL   Hemoglobin 9.2 (L) 12.0 - 16.0 g/dL   HCT 29.8 (L) 35.0 - 47.0 %   MCV 77.0 (L) 80.0 - 100.0 fL   MCH 23.7 (L) 26.0 - 34.0 pg   MCHC 30.8 (L) 32.0 - 36.0 g/dL   RDW 19.1 (H) 11.5 - 14.5 %   Platelets 307 150 - 440 K/uL  Glucose, capillary  Result Value Ref Range   Glucose-Capillary 175 (H) 65 - 99 mg/dL   Comment 1 Notify RN   Glucose, capillary  Result Value Ref Range   Glucose-Capillary 158 (H) 65 - 99 mg/dL  Basic metabolic panel  Result Value Ref Range   Sodium 136 135 - 145 mmol/L   Potassium 2.5 (LL) 3.5 - 5.1 mmol/L   Chloride 103 101 - 111 mmol/L   CO2 29 22 - 32 mmol/L   Glucose, Bld 156 (H) 65 - 99 mg/dL   BUN 6 6 - 20 mg/dL   Creatinine, Ser 0.46 0.44 - 1.00 mg/dL   Calcium 7.3 (L) 8.9 - 10.3 mg/dL   GFR calc non Af Amer >60 >60 mL/min   GFR calc Af Amer >60 >60 mL/min   Anion gap 4 (L) 5 - 15  Basic metabolic panel  Result Value Ref Range   Sodium 137 135 - 145 mmol/L   Potassium 3.8 3.5 - 5.1 mmol/L   Chloride 102 101 - 111 mmol/L   CO2 30 22 - 32 mmol/L   Glucose, Bld 107 (H) 65 - 99 mg/dL   BUN 9 6 - 20 mg/dL   Creatinine, Ser 0.63 0.44 - 1.00 mg/dL   Calcium 8.2 (L) 8.9 - 10.3 mg/dL   GFR calc non Af Amer >60 >60 mL/min   GFR calc Af Amer >60 >60 mL/min   Anion gap 5 5 - 15  Magnesium  Result Value Ref Range   Magnesium 1.8 1.7 - 2.4 mg/dL  CBC  Result Value Ref Range   WBC 5.6 3.6 - 11.0 K/uL   RBC 3.65 (L) 3.80 - 5.20 MIL/uL   Hemoglobin 8.6 (L) 12.0 - 16.0 g/dL   HCT 27.9 (L) 35.0 - 47.0 %    MCV 76.4 (L) 80.0 - 100.0 fL   MCH 23.7 (L) 26.0 - 34.0 pg   MCHC 31.0 (L) 32.0 - 36.0 g/dL   RDW 19.5 (H) 11.5 - 14.5 %   Platelets 242 150 - 440 K/uL  Urinalysis, Complete w Microscopic  Result Value Ref Range   Color, Urine YELLOW (A) YELLOW   APPearance CLEAR (A) CLEAR   Specific Gravity, Urine 1.005 1.005 - 1.030   pH 6.0 5.0 - 8.0   Glucose, UA NEGATIVE NEGATIVE mg/dL   Hgb urine dipstick NEGATIVE NEGATIVE   Bilirubin Urine NEGATIVE NEGATIVE   Ketones, ur NEGATIVE NEGATIVE mg/dL   Protein, ur NEGATIVE NEGATIVE mg/dL   Nitrite POSITIVE (A) NEGATIVE   Leukocytes, UA NEGATIVE NEGATIVE   RBC / HPF NONE SEEN 0 - 5 RBC/hpf   WBC, UA 0-5 0 - 5 WBC/hpf   Bacteria, UA MANY (A) NONE SEEN   Squamous Epithelial / LPF 0-5 (A) NONE SEEN      Assessment & Plan:   Problem List Items Addressed This Visit    GERD (gastroesophageal reflux disease) Controlled on PPI Refilled  today    Relevant Medications   omeprazole (PRILOSEC) 20 MG capsule    Other Visit Diagnoses    Abnormal urine odor    -  Primary   Relevant Orders   Urinalysis, Complete   Urine Culture   Needs flu shot       Relevant Orders   Flu vaccine HIGH DOSE PF (Fluzone High dose) (Completed)   Urinary frequency      Clinically may be consistent with UTI unsure why duration for 3 months and no other concerning symptoms. Does not entirely meet criteria for UTI - No recent antibiotics - No concern for pyelo today (no systemic symptoms, neg fever, back pain, n/v).  Plan: 1. Ordered Urinalysis and Urine Culture - note no dipstick supplies available in office today 2. HOLD antibiotics empirically for now - will check results and call in if needed based on limited symptoms 3. Improve PO hydration 4. RTC if no improvement 1-2 weeks, red flags given to return sooner     Relevant Orders   Urinalysis, Complete   Urine Culture   Lipoma of back     - Possible lipoma vs other benign knot within deeper musculature of R low  back - Benign exam, asymptomatic - Reassurance today       Meds ordered this encounter  Medications  . omeprazole (PRILOSEC) 20 MG capsule    Sig: Take 1 capsule (20 mg total) by mouth daily.    Dispense:  30 capsule    Refill:  11    Follow up plan: Return in about 3 months (around 11/13/2017) for COPD / Arthritis, med refills.  [name], [facility] 08/13/2017, 9:41 AM

## 2017-08-13 NOTE — Patient Instructions (Addendum)
Thank you for coming to the clinic today.  1.  You may have a urine infection or it may be a normal change of your urine. This can be from bacterial colonization or other changes, sometimes diet.  - We sent urine for a test and a culture, we will call you within next few days if we need to start antibiotics  - Please drink plenty of fluids, improve hydration over next 1 week  If symptoms worsening, developing nausea / vomiting, worsening back pain, fevers / chills / sweats, then please return for re-evaluation sooner.  To prevent bladder and kidney infections...  1. Wipe front to back after using the restroom  2. Drink enough water to keep your pee clear to pale yellow  If you think you are getting another bladder infection, start drinking cranberry juice and come see [place] so we can check the urine.  The knot on your right lower back does not seem related to kidney, probably a lipoma or muscle knot. This feels normal to me.  Please schedule a Follow-up Appointment to: Return in about 3 months (around 11/13/2017) for COPD / Arthritis, med refills.  If you have any other questions or concerns, please feel free to call the clinic or send a message through [place]. You may also schedule an earlier appointment if necessary.  Additionally, you may be receiving a survey about your experience at our clinic within a few days to 1 week by e-mail or mail. We value your feedback.  [name], DO [place]

## 2017-08-14 LAB — URINALYSIS, COMPLETE
BILIRUBIN URINE: NEGATIVE
Bacteria, UA: NONE SEEN /HPF
GLUCOSE, UA: NEGATIVE
Hgb urine dipstick: NEGATIVE
Hyaline Cast: NONE SEEN /LPF
Ketones, ur: NEGATIVE
Leukocytes, UA: NEGATIVE
Nitrite: NEGATIVE
PROTEIN: NEGATIVE
RBC / HPF: NONE SEEN /HPF (ref 0–2)
Specific Gravity, Urine: 1.005 (ref 1.001–1.03)
Squamous Epithelial / LPF: NONE SEEN /HPF (ref <=5)
WBC UA: NONE SEEN /HPF (ref 0–5)
pH: 5.5 (ref 5.0–8.0)

## 2017-08-15 LAB — URINE CULTURE
MICRO NUMBER:: 81118515
SPECIMEN QUALITY:: ADEQUATE

## 2017-08-24 DIAGNOSIS — H3589 Other specified retinal disorders: Secondary | ICD-10-CM | POA: Diagnosis not present

## 2017-11-14 ENCOUNTER — Other Ambulatory Visit: Payer: Self-pay | Admitting: Family Medicine

## 2017-11-14 ENCOUNTER — Encounter: Payer: Self-pay | Admitting: Family Medicine

## 2017-11-14 ENCOUNTER — Ambulatory Visit (INDEPENDENT_AMBULATORY_CARE_PROVIDER_SITE_OTHER): Payer: Medicare Other | Admitting: Family Medicine

## 2017-11-14 VITALS — BP 149/79 | HR 79 | Temp 99.5°F | Resp 16 | Ht 59.0 in | Wt 124.6 lb

## 2017-11-14 DIAGNOSIS — D649 Anemia, unspecified: Secondary | ICD-10-CM

## 2017-11-14 DIAGNOSIS — Z1239 Encounter for other screening for malignant neoplasm of breast: Secondary | ICD-10-CM

## 2017-11-14 DIAGNOSIS — E782 Mixed hyperlipidemia: Secondary | ICD-10-CM

## 2017-11-14 DIAGNOSIS — Z1231 Encounter for screening mammogram for malignant neoplasm of breast: Secondary | ICD-10-CM

## 2017-11-14 DIAGNOSIS — F3176 Bipolar disorder, in full remission, most recent episode depressed: Secondary | ICD-10-CM

## 2017-11-14 DIAGNOSIS — F419 Anxiety disorder, unspecified: Secondary | ICD-10-CM

## 2017-11-14 DIAGNOSIS — Z79899 Other long term (current) drug therapy: Secondary | ICD-10-CM

## 2017-11-14 DIAGNOSIS — J432 Centrilobular emphysema: Secondary | ICD-10-CM

## 2017-11-14 DIAGNOSIS — Z0000 Encounter for general adult medical examination without abnormal findings: Secondary | ICD-10-CM

## 2017-11-14 DIAGNOSIS — Z23 Encounter for immunization: Secondary | ICD-10-CM | POA: Diagnosis not present

## 2017-11-14 DIAGNOSIS — Z8781 Personal history of (healed) traumatic fracture: Secondary | ICD-10-CM

## 2017-11-14 NOTE — Assessment & Plan Note (Signed)
Stable, controlled on current regimen, chronic problem multiple psych dx, primarily bipolar depression with mixed anxiety. Followed by Psychiatry now, Dr Kasandra Knudsen [facility]), currently receiving rx and managing  Plan: 1. Follow-up as scheduled with Psychiatry - No changes to meds at this time - continue Olanzapine 5mg  daily, Venlafaxine ER 75mg  daily, Buproprion XL 150mg  daily, Buspirone 10mg  1-2 x daily - Follow-up as needed

## 2017-11-14 NOTE — Patient Instructions (Addendum)
Thank you for coming to the office today.  1.  Continue Advair 1 puff twice daily  If need to use Albuterol rescue inhaler more can notify office  No other med changes  Continue follow-up with Dr Kasandra Knudsen   For Mammogram screening for breast cancer   Call the [place] below anytime to schedule your own appointment now that order has been placed.  [facility] [place], [place] [address] Phone: [phone]  DUE for FASTING BLOOD WORK (no food or drink after midnight before the lab appointment, only water or coffee without cream/sugar on the morning of)  SCHEDULE "Lab Only" visit in the morning at the clinic for lab draw in 6 MONTHS   - Make sure Lab Only appointment is at about 1 week before your next appointment, so that results will be available  For Lab Results, once available within 2-3 days of blood draw, you can can log in to MyChart online to view your results and a brief explanation. Also, we can discuss results at next follow-up visit.   Please schedule a Follow-up Appointment to: Return in about 6 months (around 05/14/2018) for Annual Physical.    If you have any other questions or concerns, please feel free to call the office or send a message through [place]. You may also schedule an earlier appointment if necessary.  Additionally, you may be receiving a survey about your experience at our office within a few days to 1 week by e-mail or mail. We value your feedback.  [name], DO [place]

## 2017-11-14 NOTE — Assessment & Plan Note (Signed)
Stable, on Buspirone, see A&P for Bipolar Followed by Psychiatry now, Dr Kasandra Knudsen [facility])

## 2017-11-14 NOTE — Progress Notes (Signed)
Subjective:    Patient ID: Pamela Ball, female    DOB: 11-07-1945, 72 y.o.   MRN: [id number]  Pamela Ball is a 72 y.o. female presenting on 11/14/2017 for COPD (follow up)   HPI   FOLLOW-UP COPD: - Last visit with me for this complaint 05/2017, treated with continued Advair maintenance, see prior notes for background information. - Interval update with breathing is improved, well controlled on advair - Today patient reports doing well. She has not needed to use Albuterol rescue inhaler more than 1-2x since last visit, very rarely uses. - She has improved exercise tolerance, walking more regularly, often indoors at [place] 1 puff BID, maintenance - Never seen by Pulmonology - She is never smoker. Still, her husband smokes outside, so she gets second hand smoke, chronically - Never seen Pulmonology - Denies chest pain, dyspnea at rest, hemoptysis, wheezing, waking up at night  Bipolar Depression, Controlled in Remission / Anxiety - Last visit with me for this problem on 05/21/17, treated with continued meds and follow-up with Psychiatry CBC Dr Kasandra Knudsen, see prior notes for background information. - Today patient reports no new concerns, doing well without med changes or problems - Currently taking Olanzapine (Zyprexa) 5mg  daily, Venlafaxine ER 75mg  daily, Buproprion XL 150mg  daily, Buspirone 10mg  1-2 times daily. She is tolerating meds well and currently stable on this regimen, without recent change. - She has all refills, does not need any at this time, they are all prescribed by Dr Kasandra Knudsen - Denies any new concerns of worsening depression, mania, suicidal or homicidal ideation, insomnia - See PHQ below  Health Maintenance: Due TDap, will get today  UTD Flu and PNA vaccines  Due for DEXA Scan Osteoporosis screening - history of compression fractures, s/p kyphoplasty, reported last DEXA 2017 unavailable result. Discussed recommendation and she declines to have  repeat testing at this time.  Breast CA Screening: Due for mammogram screening. Last mammogram result 1-2 years ago states at [facility], result was reported negative, do not see in chart. She has had mammogram in 2015 by [facility]. No prior history abnormal mammogram. Family history of breast cancer in mother at age 50, metastatic to gastric and passed. Currently asymptomatic, she does self breast exams.   Depression screen [facility] 2/9 11/14/2017 05/21/2017 02/15/2017  Decreased Interest 0 1 0  Down, Depressed, Hopeless 1 1 0  PHQ - 2 Score 1 2 0  Altered sleeping 0 3 0  Tired, decreased energy 0 0 3  Change in appetite 0 1 0  Feeling bad or failure about yourself  0 1 0  Trouble concentrating 0 1 1  Moving slowly or fidgety/restless 0 0 1  Suicidal thoughts 0 0 0  PHQ-9 Score 1 8 5   Difficult doing work/chores Not difficult at all - Not difficult at all   No flowsheet data found.  Past Medical History:  Diagnosis Date  . Abdominal hernia with obstruction and without gangrene   . Anxiety 02/15/2017  . Bipolar 1 disorder, depressed, full remission ([place]) 02/15/2017  . Bipolar affective disorder ([place])   . COPD (chronic obstructive pulmonary disease) ([place]) 02/15/2017  . GERD (gastroesophageal reflux disease) 02/15/2017  . Glaucoma   . History of abdominal hernia 05/21/2017  . History of compression fracture of spine 02/15/2017  . Hyperlipidemia   . Incisional ventral hernia w obstruction 06/02/2017  . Normocytic anemia 05/23/2017  . 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 Presbycusis of both ears 02/15/2017  Past Surgical History:  Procedure Laterality Date  . ABDOMINAL HYSTERECTOMY  11/06/2006  . KYPHOPLASTY    . VENTRAL HERNIA REPAIR N/A 06/02/2017   Procedure: exploratory laparotomy repair of incarcerated  VENTRAL hernia;  Surgeon: Florene Glen, MD;  Location: [facility] ORS;  Service: General;  Laterality: N/A;   Social History   Socioeconomic History  . Marital status: Married     Spouse name: Shere Eisenhart  . Number of children: Not on file  . Years of education: [employer]  . Highest education level: Not on file  Social Needs  . Financial resource strain: Not on file  . Food insecurity - worry: Not on file  . Food insecurity - inability: Not on file  . Transportation needs - medical: Not on file  . Transportation needs - non-medical: Not on file  Occupational History  . Not on file  Tobacco Use  . Smoking status: Never Smoker  . Smokeless tobacco: Never Used  Substance and Sexual Activity  . Alcohol use: No  . Drug use: No  . Sexual activity: Not on file  Other Topics Concern  . Not on file  Social History Narrative  . Not on file   Family History  Problem Relation Age of Onset  . Cancer Mother        cancer  . Breast cancer Mother 31   Current Outpatient Medications on File Prior to Visit  Medication Sig  . buPROPion (WELLBUTRIN XL) 150 MG 24 hr tablet Take 150 mg by mouth.  . busPIRone (BUSPAR) 10 MG tablet Take 1 tablet (10 mg total) by mouth daily. Prescribed by Psychiatry Dr Kasandra Knudsen  . Fluticasone-Salmeterol (ADVAIR) 250-50 MCG/DOSE AEPB Inhale 1 puff into the lungs 2 (two) times daily.  Marland Kitchen OLANZapine (ZYPREXA) 5 MG tablet Take 5 mg by mouth at bedtime.   Marland Kitchen omeprazole (PRILOSEC) 20 MG capsule Take 1 capsule (20 mg total) by mouth daily.  . simvastatin (ZOCOR) 20 MG tablet TAKE 1 TABLET BY MOUTH EVERY EVENING  . venlafaxine XR (EFFEXOR-XR) 75 MG 24 hr capsule Take 75 mg by mouth daily with breakfast.    No current facility-administered medications on file prior to visit.     Review of Systems Per HPI unless specifically indicated above     Objective:    BP (!) 149/79   Pulse 79   Temp 99.5 F (37.5 C) (Oral)   Resp 16   Ht 4\' 11"  (1.499 m)   Wt 124 lb 9.6 oz (56.5 kg)   BMI 25.17 kg/m   Wt Readings from Last 3 Encounters:  11/14/17 124 lb 9.6 oz (56.5 kg)  08/13/17 124 lb 6.4 oz (56.4 kg)  06/18/17 123 lb 12.8 oz (56.2 kg)      Physical Exam  Constitutional: She is oriented to person, place, and time. She appears well-developed and well-nourished. No distress.  Well-appearing 72 year old female, comfortable, cooperative  HENT:  Head: Normocephalic and atraumatic.  Mouth/Throat: Oropharynx is clear and moist.  Eyes: Conjunctivae are normal. Right eye exhibits no discharge. Left eye exhibits no discharge.  Neck: Normal range of motion. Neck supple.  Cardiovascular: Normal rate, regular rhythm, normal heart sounds and intact distal pulses.  No murmur heard. Pulmonary/Chest: Effort normal and breath sounds normal. No respiratory distress. She has no wheezes. She has no rales.  Good air movement. Speaks full sentences  Musculoskeletal: Normal range of motion. She exhibits no edema.  Lymphadenopathy:    She has no cervical adenopathy.  Neurological: She is alert and oriented to person, place, and time.  Skin: Skin is warm and dry. No rash noted. She is not diaphoretic. No erythema.  Psychiatric: She has a normal mood and affect. Her behavior is normal.  Well groomed, good eye contact, normal speech and thoughts  Nursing note and vitals reviewed.    Results for orders placed or performed in visit on 08/13/17  Urine Culture  Result Value Ref Range   MICRO NUMBER: [id number]    SPECIMEN QUALITY: ADEQUATE    Sample Source URINE    STATUS: FINAL    Result:      Single organism less than 10,000 CFU/mL isolated. These organisms, commonly found on external and internal genitalia, are considered colonizers. No further testing performed.  Urinalysis, Complete  Result Value Ref Range   Color, Urine YELLOW YELLOW   APPearance CLEAR CLEAR   Specific Gravity, Urine 1.005 1.001 - 1.03   pH 5.5 5.0 - 8.0   Glucose, UA NEGATIVE NEGATIVE   Bilirubin Urine NEGATIVE NEGATIVE   Ketones, ur NEGATIVE NEGATIVE   Hgb urine dipstick NEGATIVE NEGATIVE   Protein, ur NEGATIVE NEGATIVE   Nitrite NEGATIVE NEGATIVE   Leukocytes, UA  NEGATIVE NEGATIVE   WBC, UA NONE SEEN 0 - 5 /HPF   RBC / HPF NONE SEEN 0 - 2 /HPF   Squamous Epithelial / LPF NONE SEEN < OR = 5 /HPF   Bacteria, UA NONE SEEN NONE SEEN /HPF   Hyaline Cast NONE SEEN NONE SEEN /LPF      Assessment & Plan:   Problem List Items Addressed This Visit    Anxiety    Stable, on Buspirone, see A&P for Bipolar Followed by Psychiatry now, Dr Kasandra Knudsen [facility])      Relevant Medications   busPIRone (BUSPAR) 10 MG tablet   Bipolar 1 disorder, depressed, full remission (HCC)    Stable, controlled on current regimen, chronic problem multiple psych dx, primarily bipolar depression with mixed anxiety. Followed by Psychiatry now, Dr Kasandra Knudsen [facility]), currently receiving rx and managing  Plan: 1. Follow-up as scheduled with Psychiatry - No changes to meds at this time - continue Olanzapine 5mg  daily, Venlafaxine ER 75mg  daily, Buproprion XL 150mg  daily, Buspirone 10mg  1-2 x daily - Follow-up as needed      COPD (chronic obstructive pulmonary disease) ([place]) - Primary    Improved to stable chronic COPD Second hand smoke, never smoker Not followed by Pulm. No prior PFTs/spirometry - On Advair maintenance, Albuterol rare PRN use  Plan: 1. Encouraged keep up regular walking regimen to improve lung function 2. Continue Advair 1 puff BID 3. May use albuterol but only PRN - notify office if using more frequently or flare 4. Follow-up as needed      History of compression fracture of spine    Stable without recurrence or new fracture, concern diagnosis of Osteoporosis Reviewed X-rays no evidence of thin bones or osteopenia No DEXA result available History of wrist fracture Recommend repeat DEXA, she declines       Other Visit Diagnoses    Need for diphtheria-tetanus-pertussis (Tdap) vaccine       Relevant Orders   Tdap vaccine greater than or equal to 7yo IM (Completed)   Screening for breast cancer       Ordered mammogram screening  for [facility], she may have been seen at [facility] in past, she is aware to call them to schedule, fam history of breast cancer mother  Relevant Orders   MM DIGITAL SCREENING BILATERAL      No orders of the defined types were placed in this encounter.   Follow up plan: Return in about 6 months (around 05/14/2018) for Annual Physical.  Future orders for physical 6 mo  [name], DO [facility] 11/14/2017, 12:49 PM

## 2017-11-14 NOTE — Assessment & Plan Note (Signed)
Stable without recurrence or new fracture, concern diagnosis of Osteoporosis Reviewed X-rays no evidence of thin bones or osteopenia No DEXA result available History of wrist fracture Recommend repeat DEXA, she declines

## 2017-11-14 NOTE — Assessment & Plan Note (Signed)
Improved to stable chronic COPD Second hand smoke, never smoker Not followed by Pulm. No prior PFTs/spirometry - On Advair maintenance, Albuterol rare PRN use  Plan: 1. Encouraged keep up regular walking regimen to improve lung function 2. Continue Advair 1 puff BID 3. May use albuterol but only PRN - notify office if using more frequently or flare 4. Follow-up as needed

## 2017-12-11 ENCOUNTER — Other Ambulatory Visit: Payer: Self-pay | Admitting: Family Medicine

## 2017-12-11 DIAGNOSIS — E782 Mixed hyperlipidemia: Secondary | ICD-10-CM

## 2018-01-01 ENCOUNTER — Other Ambulatory Visit: Payer: Self-pay | Admitting: Family Medicine

## 2018-01-01 DIAGNOSIS — J432 Centrilobular emphysema: Secondary | ICD-10-CM

## 2018-02-08 ENCOUNTER — Telehealth: Payer: Self-pay | Admitting: Surgery

## 2018-02-08 ENCOUNTER — Ambulatory Visit: Payer: Self-pay | Admitting: Surgery

## 2018-02-08 ENCOUNTER — Encounter: Payer: Self-pay | Admitting: Surgery

## 2018-02-08 NOTE — Telephone Encounter (Signed)
Appointment confirmed with patient yesterday 02/07/18, patient no showed appointment today with Dr. Burt Knack, I have mailed a letter for the patient to contact our office to r/s appointment.

## 2018-02-19 DIAGNOSIS — K439 Ventral hernia without obstruction or gangrene: Secondary | ICD-10-CM | POA: Diagnosis not present

## 2018-02-20 ENCOUNTER — Other Ambulatory Visit: Payer: Self-pay | Admitting: General Surgery

## 2018-02-20 DIAGNOSIS — K439 Ventral hernia without obstruction or gangrene: Secondary | ICD-10-CM

## 2018-03-05 ENCOUNTER — Ambulatory Visit
Admission: RE | Admit: 2018-03-05 | Discharge: 2018-03-05 | Disposition: A | Discharge status: Home / Self Care | Payer: Medicare Other | Source: Ambulatory Visit | Attending: General Surgery | Admitting: General Surgery

## 2018-03-05 DIAGNOSIS — M40295 Other kyphosis, thoracolumbar region: Secondary | ICD-10-CM | POA: Insufficient documentation

## 2018-03-05 DIAGNOSIS — K5750 Diverticulosis of both small and large intestine without perforation or abscess without bleeding: Secondary | ICD-10-CM | POA: Insufficient documentation

## 2018-03-05 DIAGNOSIS — K449 Diaphragmatic hernia without obstruction or gangrene: Secondary | ICD-10-CM | POA: Insufficient documentation

## 2018-03-05 DIAGNOSIS — K439 Ventral hernia without obstruction or gangrene: Secondary | ICD-10-CM

## 2018-03-05 DIAGNOSIS — K458 Other specified abdominal hernia without obstruction or gangrene: Secondary | ICD-10-CM | POA: Diagnosis not present

## 2018-03-05 DIAGNOSIS — I700 Atherosclerosis of aorta: Secondary | ICD-10-CM | POA: Diagnosis not present

## 2018-03-05 DIAGNOSIS — N281 Cyst of kidney, acquired: Secondary | ICD-10-CM | POA: Insufficient documentation

## 2018-03-05 MED ORDER — IOPAMIDOL (ISOVUE-370) INJECTION 76%
75.0000 mL | Freq: Once | INTRAVENOUS | Status: AC | PRN
  Administered 2018-03-05: 75 mL via INTRAVENOUS

## 2018-03-20 ENCOUNTER — Other Ambulatory Visit: Payer: Self-pay

## 2018-03-27 ENCOUNTER — Ambulatory Visit: Payer: Self-pay | Admitting: Surgery

## 2018-04-04 ENCOUNTER — Telehealth: Payer: Self-pay | Admitting: Family Medicine

## 2018-04-04 NOTE — Telephone Encounter (Signed)
Patient needing AWV prior to physical appt 05-14-18.  She was out and about when called and didn't have schedule so she will call back.

## 2018-04-17 ENCOUNTER — Ambulatory Visit: Payer: Self-pay | Admitting: Surgery

## 2018-04-29 ENCOUNTER — Encounter: Payer: Self-pay | Admitting: Surgery

## 2018-04-29 ENCOUNTER — Ambulatory Visit (INDEPENDENT_AMBULATORY_CARE_PROVIDER_SITE_OTHER): Payer: Medicare Other | Admitting: Surgery

## 2018-04-29 DIAGNOSIS — K432 Incisional hernia without obstruction or gangrene: Secondary | ICD-10-CM | POA: Diagnosis not present

## 2018-04-29 NOTE — Progress Notes (Signed)
Outpatient Surgical Follow Up  04/29/2018  Pamela Ball is an 72 y.o. female.   CC: Recurrent ventral hernia  HPI: This a patient who had a incarcerated ventral hernia repaired primarily with interrupted Prolene sutures in July 2018.  She has a recurrence.  Past Medical History:  Diagnosis Date  . Abdominal hernia with obstruction and without gangrene   . Anxiety 02/15/2017  . Bipolar 1 disorder, depressed, full remission ([place]) 02/15/2017  . Bipolar affective disorder ([place])   . COPD (chronic obstructive pulmonary disease) ([place]) 02/15/2017  . GERD (gastroesophageal reflux disease) 02/15/2017  . Glaucoma   . History of abdominal hernia 05/21/2017  . History of compression fracture of spine 02/15/2017  . Hyperlipidemia   . Incisional ventral hernia w obstruction 06/02/2017  . Normocytic anemia 05/23/2017  . 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 Presbycusis of both ears 02/15/2017    Past Surgical History:  Procedure Laterality Date  . ABDOMINAL HYSTERECTOMY  11/06/2006  . KYPHOPLASTY    . VENTRAL HERNIA REPAIR N/A 06/02/2017   Procedure: exploratory laparotomy repair of incarcerated  VENTRAL hernia;  Surgeon: Florene Glen, MD;  Location: [facility] ORS;  Service: General;  Laterality: N/A;    Family History  Problem Relation Age of Onset  . Cancer Mother        cancer  . Breast cancer Mother 71    Social History:  reports that she has never smoked. She has never used smokeless tobacco. She reports that she does not drink alcohol or use drugs.  Allergies:  Allergies  Allergen Reactions  . Codeine Other (See Comments)    Reaction: unknown Hasn't had a reaction in years    Medications reviewed.   Review of Systems:   Review of Systems  Constitutional: Negative.   HENT: Negative.   Eyes: Negative.   Respiratory: Negative.   Cardiovascular: Negative.   Genitourinary: Negative.   Musculoskeletal: Negative.   Skin: Negative.   Neurological: Negative.    Endo/Heme/Allergies: Negative.   Psychiatric/Behavioral: Negative.      Physical Exam:  There were no vitals taken for this visit.  Physical Exam  Constitutional: She is oriented to person, place, and time. She appears well-developed and well-nourished. No distress.  HENT:  Head: Normocephalic and atraumatic.  Eyes: Pupils are equal, round, and reactive to light. EOM are normal. Right eye exhibits no discharge. Left eye exhibits no discharge. No scleral icterus.  Neck: Normal range of motion. Neck supple.  Cardiovascular: Normal rate, regular rhythm and normal heart sounds.  Pulmonary/Chest: Effort normal and breath sounds normal. No stridor. No respiratory distress. She has no wheezes. She has no rales.  Abdominal: Soft. She exhibits no distension and no mass. There is no tenderness. There is no rebound and no guarding. A hernia is present.  Musculoskeletal: Normal range of motion. She exhibits edema. She exhibits no tenderness or deformity.  Neurological: She is alert and oriented to person, place, and time.  Skin: Skin is warm and dry. No rash noted. She is not diaphoretic. No erythema.  Psychiatric: She has a normal mood and affect. Judgment normal.  Vitals reviewed.     No results found for this or any previous visit (from the past 48 hour(s)). No results found.  Assessment/Plan:  Recurrent ventral hernia.  Patient had a prior incarceration that was repaired primarily and now clearly has a recurrence.  Recurrence is reducible and nontender.  She will require repair with mesh at this point. The patient has never  had a colonoscopy.  I believe that she must have a colonoscopy prior to this procedure.  It is an elective procedure and will require mesh.  Her CT scan has been personally reviewed showing a large hiatal hernia.  She also has a right sided lumbar hernia.  And she has some symptoms that she relates to back pain associated with this hernia.  And she has a recurrent  ventral hernia with small bowel loops.  Of additional significance is the fact that the patient initially went to see Dr. Windell Moment who sent her back to me her operating surgeon.  Reviewing that she was primarily repaired because of the acute nature of her incarceration in the emergency room was discussed with the patient and her son.  It was also revealed to me at that point that they have an appointment with the hernia service at [facility].  Because she has a lumbar hernia, recurrent ventral hernia, hiatal hernia and are looking for alternatives to my repair I would recommend that she keep the appointment with at [facility].  I would be happy to repair the recurrent ventral hernia if no other surgery is indicated and after she has had a colonoscopy.  Until then I would not recommend any repair until she seen by the [facility] specialty service. Florene Glen, MD, FACS

## 2018-04-29 NOTE — Patient Instructions (Signed)
We will send a referral to the gastroenterologist so they could do a colonoscopy. They will call you with a date time of your appointment. Please give [place] a call in a week, if they have not contacted you.  Please give [place] a call what you would want to do after being seen by the gastroenterologist and [facility] surgeons.

## 2018-05-14 ENCOUNTER — Encounter: Payer: Self-pay | Admitting: Family Medicine

## 2018-05-14 ENCOUNTER — Ambulatory Visit (INDEPENDENT_AMBULATORY_CARE_PROVIDER_SITE_OTHER): Payer: Medicare Other | Admitting: Family Medicine

## 2018-05-14 ENCOUNTER — Ambulatory Visit (INDEPENDENT_AMBULATORY_CARE_PROVIDER_SITE_OTHER): Payer: Medicare Other

## 2018-05-14 VITALS — BP 138/86 | HR 62 | Temp 98.8°F | Resp 16 | Ht 59.0 in | Wt 127.6 lb

## 2018-05-14 DIAGNOSIS — K219 Gastro-esophageal reflux disease without esophagitis: Secondary | ICD-10-CM | POA: Diagnosis not present

## 2018-05-14 DIAGNOSIS — F3176 Bipolar disorder, in full remission, most recent episode depressed: Secondary | ICD-10-CM | POA: Diagnosis not present

## 2018-05-14 DIAGNOSIS — J432 Centrilobular emphysema: Secondary | ICD-10-CM | POA: Diagnosis not present

## 2018-05-14 DIAGNOSIS — E559 Vitamin D deficiency, unspecified: Secondary | ICD-10-CM | POA: Diagnosis not present

## 2018-05-14 DIAGNOSIS — Z79899 Other long term (current) drug therapy: Secondary | ICD-10-CM | POA: Diagnosis not present

## 2018-05-14 DIAGNOSIS — Z0000 Encounter for general adult medical examination without abnormal findings: Secondary | ICD-10-CM

## 2018-05-14 DIAGNOSIS — Z780 Asymptomatic menopausal state: Secondary | ICD-10-CM | POA: Diagnosis not present

## 2018-05-14 DIAGNOSIS — D649 Anemia, unspecified: Secondary | ICD-10-CM

## 2018-05-14 DIAGNOSIS — J3089 Other allergic rhinitis: Secondary | ICD-10-CM | POA: Diagnosis not present

## 2018-05-14 DIAGNOSIS — E782 Mixed hyperlipidemia: Secondary | ICD-10-CM

## 2018-05-14 DIAGNOSIS — J309 Allergic rhinitis, unspecified: Secondary | ICD-10-CM | POA: Insufficient documentation

## 2018-05-14 MED ORDER — FLUTICASONE PROPIONATE 50 MCG/ACT NA SUSP: 2.0000 | Freq: Every day | NASAL | 3 refills | Status: DC

## 2018-05-14 MED ORDER — SIMVASTATIN 20 MG PO TABS: 20.0000 mg | ORAL_TABLET | Freq: Every day | ORAL | 5 refills | Status: DC

## 2018-05-14 MED ORDER — OMEPRAZOLE 20 MG PO CPDR: 20.0000 mg | DELAYED_RELEASE_CAPSULE | Freq: Every day | ORAL | 5 refills | Status: DC

## 2018-05-14 NOTE — Progress Notes (Addendum)
Subjective:   Pamela Ball is a 72 y.o. female who presents for an Initial Medicare Annual Wellness Visit.  Review of Systems      Cardiac Risk Factors include: advanced age (>48men, >52 women);dyslipidemia;hypertension     Objective:    Today's Vitals   05/14/18 0827  BP: 138/86  Pulse: 62  Resp: 16  Temp: 98.8 F (37.1 C)  TempSrc: Oral  Weight: 127 lb 9.6 oz (57.9 kg)  Height: 4\' 11"  (1.499 m)   Body mass index is 25.77 kg/m.  Advanced Directives 05/14/2018 06/02/2017 06/02/2017  Does Patient Have a Medical Advance Directive? No No No  Would patient like information on creating a medical advance directive? Yes (MAU/Ambulatory/Procedural Areas - Information given) Yes (Inpatient - patient requests chaplain consult to create a medical advance directive) No - Patient declined    Current Medications (verified) Outpatient Encounter Medications as of 05/14/2018  Medication Sig  . ADVAIR DISKUS 250-50 MCG/DOSE AEPB TAKE 1 PUFF BY MOUTH TWICE A DAY  . buPROPion (WELLBUTRIN XL) 150 MG 24 hr tablet Take 150 mg by mouth.  . busPIRone (BUSPAR) 10 MG tablet Take 1 tablet (10 mg total) by mouth daily. Prescribed by Psychiatry Dr Kasandra Knudsen  . Multiple Vitamin (MULTI-VITAMINS) TABS Take by mouth.  . OLANZapine (ZYPREXA) 5 MG tablet Take 5 mg by mouth at bedtime.   Marland Kitchen omeprazole (PRILOSEC) 20 MG capsule Take 1 capsule (20 mg total) by mouth daily.  . simvastatin (ZOCOR) 20 MG tablet TAKE 1 TABLET BY MOUTH EVERY DAY IN THE EVENING  . venlafaxine XR (EFFEXOR-XR) 75 MG 24 hr capsule Take 75 mg by mouth daily with breakfast.    No facility-administered encounter medications on file as of 05/14/2018.     Allergies (verified) Codeine   History: Past Medical History:  Diagnosis Date  . Abdominal hernia with obstruction and without gangrene   . Anxiety 02/15/2017  . Bipolar 1 disorder, depressed, full remission ([place]) 02/15/2017  . Bipolar affective disorder ([place])   . COPD (chronic  obstructive pulmonary disease) ([place]) 02/15/2017  . GERD (gastroesophageal reflux disease) 02/15/2017  . Glaucoma   . History of abdominal hernia 05/21/2017  . History of compression fracture of spine 02/15/2017  . Hyperlipidemia   . Incisional ventral hernia w obstruction 06/02/2017  . Normocytic anemia 05/23/2017  . 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 Presbycusis of both ears 02/15/2017   Past Surgical History:  Procedure Laterality Date  . ABDOMINAL HYSTERECTOMY  11/06/2006  . KYPHOPLASTY    . VENTRAL HERNIA REPAIR N/A 06/02/2017   Procedure: exploratory laparotomy repair of incarcerated  VENTRAL hernia;  Surgeon: Florene Glen, MD;  Location: [facility] ORS;  Service: General;  Laterality: N/A;   Family History  Problem Relation Age of Onset  . Cancer Mother        cancer  . Breast cancer Mother 30   Social History   Socioeconomic History  . Marital status: Married    Spouse name: Pamela Ball  . Number of children: Not on file  . Years of education: [employer]  . Highest education level: Not on file  Occupational History  . Not on file  Social Needs  . Financial resource strain: Not hard at all  . Food insecurity:    Worry: Never true    Inability: Never true  . Transportation needs:    Medical: No    Non-medical: No  Tobacco Use  . Smoking status: Never Smoker  . Smokeless tobacco:  Never Used  Substance and Sexual Activity  . Alcohol use: No  . Drug use: No  . Sexual activity: Not on file  Lifestyle  . Physical activity:    Days per week: 0 days    Minutes per session: 0 min  . Stress: Not at all  Relationships  . Social connections:    Talks on phone: More than three times a week    Gets together: More than three times a week    Attends religious service: More than 4 times per year    Active member of club or organization: No    Attends meetings of clubs or organizations: Never    Relationship status: Married  Other Topics Concern  . Not on  file  Social History Narrative  . Not on file    Tobacco Counseling Counseling given: Not Answered   Clinical Intake:  Pre-visit preparation completed: Yes  Pain : No/denies pain     Nutritional Status: BMI 25 -29 Overweight Nutritional Risks: None Diabetes: No  How often do you need to have someone help you when you read instructions, pamphlets, or other written materials from your doctor or pharmacy?: 1 - Never What is the last grade level you completed in school?: high school   Interpreter Needed?: No  Information entered by :: Tiffany Hill,LPN    Activities of Daily Living In your present state of health, do you have any difficulty performing the following activities: 05/14/2018 06/02/2017  Hearing? Y N  Comment has bilateral hearing aids  -  Vision? Y N  Difficulty concentrating or making decisions? Y N  Walking or climbing stairs? N N  Dressing or bathing? N N  Doing errands, shopping? N N  Preparing Food and eating ? N -  Using the Toilet? N -  In the past six months, have you accidently leaked urine? N -  Do you have problems with loss of bowel control? N -  Managing your Medications? N -  Managing your Finances? N -  Housekeeping or managing your Housekeeping? N -  Some recent data might be hidden     Immunizations and Health Maintenance Immunization History  Administered Date(s) Administered  . Influenza, High Dose Seasonal PF 08/13/2017  . Pneumococcal Conjugate-13 05/21/2017  . Tdap 11/14/2017   Health Maintenance Due  Topic Date Due  . COLONOSCOPY  09/25/1995  . MAMMOGRAM  11/06/2017    Patient Care Team: Olin Hauser, DO as PCP - General (Family Medicine) Myer Haff, MD as Referring Physician (Psychiatry)  Indicate any recent Medical Services you may have received from other than [facility] providers in the past year (date may be approximate).     Assessment:   This is a routine wellness examination for Pamela Ball.  Hearing/Vision  screen Vision Screening Comments: Goes to [facility] annually   Dietary issues and exercise activities discussed: Current Exercise Habits: The patient does not participate in regular exercise at present, Exercise limited by: None identified  Goals    . DIET - INCREASE WATER INTAKE     Recommend drinking at least 6-8 glasses of water a day       Depression Screen PHQ 2/9 Scores 05/14/2018 11/14/2017 05/21/2017 02/15/2017  PHQ - 2 Score 0 1 2 0  PHQ- 9 Score - 1 8 5     Fall Risk Fall Risk  05/14/2018 11/14/2017 02/15/2017  Falls in the past year? No No No    Is the patient's home free of loose throw rugs  in walkways, pet beds, electrical cords, etc?   yes      Grab bars in the bathroom? no      Handrails on the stairs?   no stairs       Adequate lighting?   yes  Timed Get Up and Go Performed Completed in 8 seconds with no use of assistive devices, steady gait. No intervention needed at this time.   Cognitive Function:     6CIT Screen 05/14/2018  What Year? 0 points  What month? 0 points  What time? 0 points  Count back from 20 0 points  Months in reverse 0 points  Repeat phrase 2 points  Total Score 2    Screening Tests Health Maintenance  Topic Date Due  . COLONOSCOPY  09/25/1995  . MAMMOGRAM  11/06/2017  . DEXA SCAN  11/14/2018 (Originally 09/24/2010)  . PNA vac Low Risk Adult (2 of 2 - PPSV23) 05/21/2018  . INFLUENZA VACCINE  06/06/2018  . TETANUS/TDAP  11/15/2027  . Hepatitis C Screening  Completed    Qualifies for Shingles Vaccine? Yes, shingrix discussed  Cancer Screenings: Lung: Low Dose CT Chest recommended if Age 47-80 years, 30 pack-year currently smoking OR have quit w/in 15years. Patient does not qualify. Breast: Up to date on Mammogram? No  ordered Up to date of Bone Density/Dexa? No ordered Colorectal: has appt with GI   Additional Screenings: Hepatitis C Screening:  Completed 05/21/2017     Plan:    I have personally reviewed and addressed  the Medicare Annual Wellness questionnaire and have noted the following in the patient's chart:  A. Medical and social history B. Use of alcohol, tobacco or illicit drugs  C. Current medications and supplements D. Functional ability and status E.  Nutritional status F.  Physical activity G. Advance directives H. List of other physicians I.  Hospitalizations, surgeries, and ER visits in previous 12 months J.  [place] such as hearing and vision if needed, cognitive and depression L. Referrals and appointments   In addition, I have reviewed and discussed with patient certain preventive protocols, quality metrics, and best practice recommendations. A written personalized care plan for preventive services as well as general preventive health recommendations were provided to patient.   Signed,  Tyler Aas, LPN Nurse Health Advisor   Nurse Notes:none

## 2018-05-14 NOTE — Patient Instructions (Addendum)
Pamela Ball , Thank you for taking time to come for your Medicare Wellness Visit. I appreciate your ongoing commitment to your health goals. Please review the following plan we discussed and let me know if I can assist you in the future.   Screening recommendations/referrals: Colonoscopy: due now Mammogram: Please call [phone] to schedule your mammogram.  Bone Density: Please call [phone] to schedule Recommended yearly ophthalmology/optometry visit for glaucoma screening and checkup Recommended yearly dental visit for hygiene and checkup  Vaccinations: Influenza vaccine: due 07/2018 Pneumococcal vaccine: pneumovax 23 due 05/21/2018 Tdap vaccine: up to date Shingles vaccine: Shingrix eligible, check with your insurance company for coverage   Advanced directives: Advance directive discussed with you today. I have provided a copy for you to complete at home and have notarized. Once this is complete please bring a copy in to our office so we can scan it into your chart.  Conditions/risks identified: Recommend drinking at least 6-8 glasses of water a day   Next appointment:Follow up in one year for your annual wellness exam.    Preventive Care 65 Years and Older, Female Preventive care refers to lifestyle choices and visits with your health care provider that can promote health and wellness. What does preventive care include?  A yearly physical exam. This is also called an annual well check.  Dental exams once or twice a year.  Routine eye exams. Ask your health care provider how often you should have your eyes checked.  Personal lifestyle choices, including:  Daily care of your teeth and gums.  Regular physical activity.  Eating a healthy diet.  Avoiding tobacco and drug use.  Limiting alcohol use.  Practicing safe sex.  Taking low-dose aspirin every day.  Taking vitamin and mineral supplements as recommended by your health care provider. What happens during an  annual well check? The services and screenings done by your health care provider during your annual well check will depend on your age, overall health, lifestyle risk factors, and family history of disease. Counseling  Your health care provider may ask you questions about your:  Alcohol use.  Tobacco use.  Drug use.  Emotional well-being.  Home and relationship well-being.  Sexual activity.  Eating habits.  History of falls.  Memory and ability to understand (cognition).  Work and work Statistician.  Reproductive health. Screening  You may have the following tests or measurements:  Height, weight, and BMI.  Blood pressure.  Lipid and cholesterol levels. These may be checked every 5 years, or more frequently if you are over 56 years old.  Skin check.  Lung cancer screening. You may have this screening every year starting at age 86 if you have a 30-pack-year history of smoking and currently smoke or have quit within the past 15 years.  Fecal occult blood test (FOBT) of the stool. You may have this test every year starting at age 31.  Flexible sigmoidoscopy or colonoscopy. You may have a sigmoidoscopy every 5 years or a colonoscopy every 10 years starting at age 70.  Hepatitis C blood test.  Hepatitis B blood test.  Sexually transmitted disease (STD) testing.  Diabetes screening. This is done by checking your blood sugar (glucose) after you have not eaten for a while (fasting). You may have this done every 1-3 years.  Bone density scan. This is done to screen for osteoporosis. You may have this done starting at age 78.  Mammogram. This may be done every 1-2 years. Talk to your health care provider  about how often you should have regular mammograms. Talk with your health care provider about your test results, treatment options, and if necessary, the need for more tests. Vaccines  Your health care provider may recommend certain vaccines, such as:  Influenza  vaccine. This is recommended every year.  Tetanus, diphtheria, and acellular pertussis (Tdap, Td) vaccine. You may need a Td booster every 10 years.  Zoster vaccine. You may need this after age 78.  Pneumococcal 13-valent conjugate (PCV13) vaccine. One dose is recommended after age 78.  Pneumococcal polysaccharide (PPSV23) vaccine. One dose is recommended after age 102. Talk to your health care provider about which screenings and vaccines you need and how often you need them. This information is not intended to replace advice given to you by your health care provider. Make sure you discuss any questions you have with your health care provider. Document Released: 11/19/2015 Document Revised: 07/12/2016 Document Reviewed: 08/24/2015 Elsevier Interactive Patient Education  2017 [place] Prevention in the Home Falls can cause injuries. They can happen to people of all ages. There are many things you can do to make your home safe and to help prevent falls. What can I do on the outside of my home?  Regularly fix the edges of walkways and driveways and fix any cracks.  Remove anything that might make you trip as you walk through a door, such as a raised step or threshold.  Trim any bushes or trees on the path to your home.  Use bright outdoor lighting.  Clear any walking paths of anything that might make someone trip, such as rocks or tools.  Regularly check to see if handrails are loose or broken. Make sure that both sides of any steps have handrails.  Any raised decks and porches should have guardrails on the edges.  Have any leaves, snow, or ice cleared regularly.  Use sand or salt on walking paths during winter.  Clean up any spills in your garage right away. This includes oil or grease spills. What can I do in the bathroom?  Use night lights.  Install grab bars by the toilet and in the tub and shower. Do not use towel bars as grab bars.  Use non-skid mats or decals  in the tub or shower.  If you need to sit down in the shower, use a plastic, non-slip stool.  Keep the floor dry. Clean up any water that spills on the floor as soon as it happens.  Remove soap buildup in the tub or shower regularly.  Attach bath mats securely with double-sided non-slip rug tape.  Do not have throw rugs and other things on the floor that can make you trip. What can I do in the bedroom?  Use night lights.  Make sure that you have a light by your bed that is easy to reach.  Do not use any sheets or blankets that are too big for your bed. They should not hang down onto the floor.  Have a firm chair that has side arms. You can use this for support while you get dressed.  Do not have throw rugs and other things on the floor that can make you trip. What can I do in the kitchen?  Clean up any spills right away.  Avoid walking on wet floors.  Keep items that you use a lot in easy-to-reach places.  If you need to reach something above you, use a strong step stool that has a grab bar.  Keep electrical cords out of the way.  Do not use floor polish or wax that makes floors slippery. If you must use wax, use non-skid floor wax.  Do not have throw rugs and other things on the floor that can make you trip. What can I do with my stairs?  Do not leave any items on the stairs.  Make sure that there are handrails on both sides of the stairs and use them. Fix handrails that are broken or loose. Make sure that handrails are as long as the stairways.  Check any carpeting to make sure that it is firmly attached to the stairs. Fix any carpet that is loose or worn.  Avoid having throw rugs at the top or bottom of the stairs. If you do have throw rugs, attach them to the floor with carpet tape.  Make sure that you have a light switch at the top of the stairs and the bottom of the stairs. If you do not have them, ask someone to add them for you. What else can I do to help  prevent falls?  Wear shoes that:  Do not have high heels.  Have rubber bottoms.  Are comfortable and fit you well.  Are closed at the toe. Do not wear sandals.  If you use a stepladder:  Make sure that it is fully opened. Do not climb a closed stepladder.  Make sure that both sides of the stepladder are locked into place.  Ask someone to hold it for you, if possible.  Clearly mark and make sure that you can see:  Any grab bars or handrails.  First and last steps.  Where the edge of each step is.  Use tools that help you move around (mobility aids) if they are needed. These include:  Canes.  Walkers.  Scooters.  Crutches.  Turn on the lights when you go into a dark area. Replace any light bulbs as soon as they burn out.  Set up your furniture so you have a clear path. Avoid moving your furniture around.  If any of your floors are uneven, fix them.  If there are any pets around you, be aware of where they are.  Review your medicines with your doctor. Some medicines can make you feel dizzy. This can increase your chance of falling. Ask your doctor what other things that you can do to help prevent falls. This information is not intended to replace advice given to you by your health care provider. Make sure you discuss any questions you have with your health care provider. Document Released: 08/19/2009 Document Revised: 03/30/2016 Document Reviewed: 11/27/2014 Elsevier Interactive Patient Education  2017 [employer].

## 2018-05-14 NOTE — Assessment & Plan Note (Signed)
Persistent problem Will add back Flonase

## 2018-05-14 NOTE — Progress Notes (Signed)
Subjective:    Patient ID: Pamela Ball, female    DOB: 1946-07-31, 72 y.o.   MRN: [id number]  Pamela Ball is a 72 y.o. female presenting on 05/14/2018 for Annual Exam   HPI   Here for Annual Physical and already had fasting lab draw today. She was also seen for Annual Medicare Wellness visit today with [facility] LPN   FOLLOW-UP COPD: - Last visit with me for this complaint 11/2017, treated with continued Advair maintenance, see prior notes for background information. - Interval update with breathing is improved and actually she has discontinued Advair due to cost now up to $45 per month, she has been off for 2 months and feels she no longer needs this medicine - Today patient reports doing well, without COPD flare up recently - She still is improving exercise tolerance, walking more regularly, often indoors at [facility] - No longer on Advair or Albuterol. Never seen by Pulmonology - She is never smoker. Still, her husband smokes outside, so she gets second hand smoke, chronically  Allergies, Environmental Reports concern with seasonal allergies, requested refill on Flonase, has used this before but needs to restart it.  HYPERLIPIDEMIA: - Reports no concerns except due for refill Simvastatin. Last lipid panel 05/2017 - drawn today now - Currently taking Simvastatin 20mg , tolerating well without side effects or myalgias Lifestyle - Diet: balanced diet - Exercise: stays active, baby sitting granddaughter  GERD Reports chronic problem. Controlled heartburn and GERD on Omeprazole 20mg  daily. Due for refill   Bipolar Depression, Controlled in Remission / Anxiety - Last visit with me for this problem on 11/2017, treated with continued meds and follow-up with Psychiatry CBC Dr Kasandra Knudsen, see prior notes for background information. - Interval update - she has not returned to Psychiatry has not been seen for about 3-5 months, she is due for apt and eventually refills - Today  patient reports no new concerns, doing well without med changes or problems - Currently taking Olanzapine (Zyprexa) 5mg  daily, Venlafaxine ER 75mg  daily, Buproprion XL 150mg  daily, Buspirone 10mg  1-2 times daily. She is tolerating meds well and currently stable on this regimen, without recent change. - She has all refills, does not need any at this time, they are all prescribed by Dr Kasandra Knudsen   Health Maintenance: UTD Tdap  UTD Flu and PNA vaccines  Due for DEXA Scan Osteoporosis screening - history of compression fractures, s/p kyphoplasty, reported last DEXA 2017 unavailable result. She was ordered DEXA at [facility] today  Breast CA Screening: Due for mammogram screening. Last mammogram result 1-2 years ago states at [facility], result was reported negative, do not see in chart. She has had mammogram in 2015 by [facility]. No prior history abnormal mammogram. Family history of breast cancer in mother at age 70, metastatic to gastric and passed. Currently asymptomatic, she does self breast exams. - She admits has not scheduled this yet but will plan to do so   Depression screen [facility] 2/9 05/14/2018 11/14/2017 05/21/2017  Decreased Interest 0 0 1  Down, Depressed, Hopeless 0 1 1  PHQ - 2 Score 0 1 2  Altered sleeping 0 0 3  Tired, decreased energy 0 0 0  Change in appetite 0 0 1  Feeling bad or failure about yourself  0 0 1  Trouble concentrating 0 0 1  Moving slowly or fidgety/restless 0 0 0  Suicidal thoughts 0 0 0  PHQ-9 Score - 1 8  Difficult doing work/chores Not difficult at  all Not difficult at all -    Past Medical History:  Diagnosis Date  . Abdominal hernia with obstruction and without gangrene   . Anxiety 02/15/2017  . Bipolar 1 disorder, depressed, full remission ([place]) 02/15/2017  . Bipolar affective disorder ([place])   . COPD (chronic obstructive pulmonary disease) ([place]) 02/15/2017  . GERD (gastroesophageal reflux disease) 02/15/2017  . Glaucoma   . History of abdominal hernia 05/21/2017  .  History of compression fracture of spine 02/15/2017  . Hyperlipidemia   . Incisional ventral hernia w obstruction 06/02/2017  . Normocytic anemia 05/23/2017  . 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 Presbycusis of both ears 02/15/2017   Past Surgical History:  Procedure Laterality Date  . ABDOMINAL HYSTERECTOMY  11/06/2006  . KYPHOPLASTY    . VENTRAL HERNIA REPAIR N/A 06/02/2017   Procedure: exploratory laparotomy repair of incarcerated  VENTRAL hernia;  Surgeon: Florene Glen, MD;  Location: [facility] ORS;  Service: General;  Laterality: N/A;   Social History   Socioeconomic History  . Marital status: Married    Spouse name: Jearldean Gutt  . Number of children: Not on file  . Years of education: [employer]  . Highest education level: Not on file  Occupational History  . Not on file  Social Needs  . Financial resource strain: Not hard at all  . Food insecurity:    Worry: Never true    Inability: Never true  . Transportation needs:    Medical: No    Non-medical: No  Tobacco Use  . Smoking status: Never Smoker  . Smokeless tobacco: Never Used  Substance and Sexual Activity  . Alcohol use: No  . Drug use: No  . Sexual activity: Not on file  Lifestyle  . Physical activity:    Days per week: 0 days    Minutes per session: 0 min  . Stress: Not at all  Relationships  . Social connections:    Talks on phone: More than three times a week    Gets together: More than three times a week    Attends religious service: More than 4 times per year    Active member of club or organization: No    Attends meetings of clubs or organizations: Never    Relationship status: Married  . Intimate partner violence:    Fear of current or ex partner: No    Emotionally abused: No    Physically abused: No    Forced sexual activity: No  Other Topics Concern  . Not on file  Social History Narrative  . Not on file   Family History  Problem Relation Age of Onset  . Cancer Mother         cancer  . Breast cancer Mother 79   Current Outpatient Medications on File Prior to Visit  Medication Sig  . buPROPion (WELLBUTRIN XL) 150 MG 24 hr tablet Take 150 mg by mouth.  . busPIRone (BUSPAR) 10 MG tablet Take 1 tablet (10 mg total) by mouth daily. Prescribed by Psychiatry Dr Kasandra Knudsen  . OLANZapine (ZYPREXA) 5 MG tablet Take 5 mg by mouth at bedtime.   Marland Kitchen venlafaxine XR (EFFEXOR-XR) 75 MG 24 hr capsule Take 75 mg by mouth daily with breakfast.    No current facility-administered medications on file prior to visit.     Review of Systems Per HPI unless specifically indicated above      Objective:    BP 138/86   Pulse 62   Temp 98.8  F (37.1 C) (Oral)   Resp 16   Ht 4\' 11"  (1.499 m)   Wt 127 lb 9.6 oz (57.9 kg)   BMI 25.77 kg/m   Wt Readings from Last 3 Encounters:  05/14/18 127 lb 9.6 oz (57.9 kg)  05/14/18 127 lb 9.6 oz (57.9 kg)  11/14/17 124 lb 9.6 oz (56.5 kg)    Physical Exam  Constitutional: She is oriented to person, place, and time. She appears well-developed and well-nourished. No distress.  Well-appearing elderly 72 year old female, comfortable, cooperative  HENT:  Head: Normocephalic and atraumatic.  Mouth/Throat: Oropharynx is clear and moist.  Eyes: Pupils are equal, round, and reactive to light. Conjunctivae and EOM are normal. Right eye exhibits no discharge. Left eye exhibits no discharge.  Neck: Normal range of motion. Neck supple. No thyromegaly present.  Cardiovascular: Normal rate, regular rhythm, normal heart sounds and intact distal pulses.  No murmur heard. Pulmonary/Chest: Effort normal and breath sounds normal. No respiratory distress. She has no wheezes. She has no rales.  Good air movement. Speaks full sentences  Abdominal: Soft. Bowel sounds are normal. She exhibits no distension and no mass. There is no tenderness.  Musculoskeletal: Normal range of motion. She exhibits no edema or tenderness.  Upper / Lower Extremities: - Normal muscle  tone, strength bilateral upper extremities 5/5, lower extremities 5/5  Lymphadenopathy:    She has no cervical adenopathy.  Neurological: She is alert and oriented to person, place, and time.  Distal sensation intact to light touch all extremities  Skin: Skin is warm and dry. No rash noted. She is not diaphoretic. No erythema.  Psychiatric: She has a normal mood and affect. Her behavior is normal.  Well groomed, good eye contact, normal speech and thoughts  Nursing note and vitals reviewed.  Results for orders placed or performed in visit on 08/13/17  Urine Culture  Result Value Ref Range   MICRO NUMBER: [id number]    SPECIMEN QUALITY: ADEQUATE    Sample Source URINE    STATUS: FINAL    Result:      Single organism less than 10,000 CFU/mL isolated. These organisms, commonly found on external and internal genitalia, are considered colonizers. No further testing performed.  Urinalysis, Complete  Result Value Ref Range   Color, Urine YELLOW YELLOW   APPearance CLEAR CLEAR   Specific Gravity, Urine 1.005 1.001 - 1.03   pH 5.5 5.0 - 8.0   Glucose, UA NEGATIVE NEGATIVE   Bilirubin Urine NEGATIVE NEGATIVE   Ketones, ur NEGATIVE NEGATIVE   Hgb urine dipstick NEGATIVE NEGATIVE   Protein, ur NEGATIVE NEGATIVE   Nitrite NEGATIVE NEGATIVE   Leukocytes, UA NEGATIVE NEGATIVE   WBC, UA NONE SEEN 0 - 5 /HPF   RBC / HPF NONE SEEN 0 - 2 /HPF   Squamous Epithelial / LPF NONE SEEN < OR = 5 /HPF   Bacteria, UA NONE SEEN NONE SEEN /HPF   Hyaline Cast NONE SEEN NONE SEEN /LPF      Assessment & Plan:   Problem List Items Addressed This Visit    Allergic rhinitis due to allergen    Persistent problem Will add back Flonase      Relevant Medications   fluticasone (FLONASE) 50 MCG/ACT nasal spray   Bipolar 1 disorder, depressed, full remission (HCC)    Stable, controlled on current regimen, chronic problem multiple psych dx, primarily bipolar depression with mixed anxiety. Followed by Psychiatry  now, Dr Kasandra Knudsen [facility]), currently receiving rx and  managing  Plan: 1. Follow-up as scheduled with Psychiatry - advised her to re-schedule as soon as can for routine visit - she will need refills on her Psychiatric medications - Olanzapine 5mg  daily, Venlafaxine ER 75mg  daily, Buproprion XL 150mg  daily, Buspirone 10mg  1-2 x daily - Follow-up as needed      COPD (chronic obstructive pulmonary disease) (HCC)    Stable chronic COPD without flare Second hand smoke, never smoker Not followed by Pulm. No prior PFTs/spirometry - Off Advair maintenance due to cost  Plan: 1. Offered alternative maintenance inhaler but she declines at this time, will defer due to cost and she has demonstrated may not require it long term use - stable without advair 2. Encouraged keep up regular walking regimen to improve lung function 3. May use albuterol but only PRN - notify office if using more frequently or flare 4. Follow-up as needed      Relevant Medications   fluticasone (FLONASE) 50 MCG/ACT nasal spray   GERD (gastroesophageal reflux disease)    Stable Refilled Omeprazole 20mg  daily      Relevant Medications   omeprazole (PRILOSEC) 20 MG capsule   Hyperlipidemia    Previously controlled cholesterol on statin and lifestyle Last lipid panel 05/2017 - pending result today now  Plan: 1. Continue current meds - Simvastatin 20mg  daily 2. Encourage improved lifestyle - low carb/cholesterol, reduce portion size, continue improving regular exercise      Relevant Medications   simvastatin (ZOCOR) 20 MG tablet   Normocytic anemia    Pending result of CBC for Hgb trend       Other Visit Diagnoses    Annual physical exam    -  Primary   Long-term use of high-risk medication        Updated Health Maintenance information Reviewed recent lab results with patient Encouraged improvement to lifestyle with diet and exercise    #Abdominal hernia Previously followed by [facility]  Surgical, she was referred to [facility] Dr Bonna Gains in 05/2018 has not seen yet, but patient declines she already scheduled apt with [facility] for eval of hernia, I advised patient to cancel her upcoming new patient apt with [facility] if she is not interested to see them. - however note we may need to return to GI for other problems in future especially if still persistent anemia and need work-up for potential GI blood loss  Meds ordered this encounter  Medications  . omeprazole (PRILOSEC) 20 MG capsule    Sig: Take 1 capsule (20 mg total) by mouth daily before breakfast.    Dispense:  30 capsule    Refill:  5  . simvastatin (ZOCOR) 20 MG tablet    Sig: Take 1 tablet (20 mg total) by mouth daily.    Dispense:  30 tablet    Refill:  5  . fluticasone (FLONASE) 50 MCG/ACT nasal spray    Sig: Place 2 sprays into both nostrils daily. Use for 4-6 weeks then stop and use seasonally or as needed.    Dispense:  16 g    Refill:  3    Follow up plan: Return in about 6 months (around 11/14/2018) for 6 month f/u COPD, GERD, Mood - med refills.  [name], [facility] 05/14/2018, 1:33 PM

## 2018-05-14 NOTE — Assessment & Plan Note (Signed)
Stable Refilled Omeprazole 20mg  daily

## 2018-05-14 NOTE — Assessment & Plan Note (Signed)
Stable, controlled on current regimen, chronic problem multiple psych dx, primarily bipolar depression with mixed anxiety. Followed by Psychiatry now, Dr Kasandra Knudsen [facility]), currently receiving rx and managing  Plan: 1. Follow-up as scheduled with Psychiatry - advised her to re-schedule as soon as can for routine visit - she will need refills on her Psychiatric medications - Olanzapine 5mg  daily, Venlafaxine ER 75mg  daily, Buproprion XL 150mg  daily, Buspirone 10mg  1-2 x daily - Follow-up as needed

## 2018-05-14 NOTE — Assessment & Plan Note (Signed)
Stable chronic COPD without flare Second hand smoke, never smoker Not followed by Pulm. No prior PFTs/spirometry - Off Advair maintenance due to cost  Plan: 1. Offered alternative maintenance inhaler but she declines at this time, will defer due to cost and she has demonstrated may not require it long term use - stable without advair 2. Encouraged keep up regular walking regimen to improve lung function 3. May use albuterol but only PRN - notify office if using more frequently or flare 4. Follow-up as needed

## 2018-05-14 NOTE — Assessment & Plan Note (Signed)
Previously controlled cholesterol on statin and lifestyle Last lipid panel 05/2017 - pending result today now  Plan: 1. Continue current meds - Simvastatin 20mg  daily 2. Encourage improved lifestyle - low carb/cholesterol, reduce portion size, continue improving regular exercise

## 2018-05-14 NOTE — Assessment & Plan Note (Signed)
Pending result of CBC for Hgb trend

## 2018-05-14 NOTE — Patient Instructions (Addendum)
Thank you for coming to the office today.  Already refilled Simvastatin (cholesterol) and Omeprazole (for stomach acid) - sent to pharmacy  Please call Dr Kasandra Knudsen at [facility] soon regarding scheduling next follow-up apt and request medication refills of the following:  .  buPROPion (WELLBUTRIN XL) 150 MG 24 hr tablet, Take 150 mg by mouth .  busPIRone (BUSPAR) 10 MG tablet, Take 1 tablet (10 mg total) by mouth daily .  OLANZapine (ZYPREXA) 5 MG tablet, Take 5 mg by mouth at bedtime .  venlafaxine XR (EFFEXOR-XR) 75 MG 24 hr capsule, Take 75 mg by mouth daily with breakfast  --------------------------------------------------------  Call to CANCEL your new patient apt with Dr Bonna Gains on 05/30/18  [facility]) [address], [place] [address] Phone: [phone]  For Mammogram screening for breast cancer  Call the [place] below anytime to schedule your own appointment now that order has been placed.  [facility] [place], [place] [address] Phone: [phone]   Stay tuned for lab results  Please schedule a Follow-up Appointment to: Return in about 6 months (around 11/14/2018) for 6 month f/u COPD, GERD, Mood - med refills.  If you have any other questions or concerns, please feel free to call the office or send a message through [place]. You may also schedule an earlier appointment if necessary.  Additionally, you may be receiving a survey about your experience at our office within a few days to 1 week by e-mail or mail. We value your feedback.  [name], DO [place]

## 2018-05-15 LAB — CBC WITH DIFFERENTIAL/PLATELET
BASOS ABS: 41 {cells}/uL (ref 0–200)
BASOS PCT: 0.9 %
EOS PCT: 3.2 %
Eosinophils Absolute: 147 cells/uL (ref 15–500)
HEMATOCRIT: 43.7 % (ref 35.0–45.0)
HEMOGLOBIN: 14.5 g/dL (ref 11.7–15.5)
Lymphs Abs: 1141 cells/uL (ref 850–3900)
MCH: 32.2 pg (ref 27.0–33.0)
MCHC: 33.2 g/dL (ref 32.0–36.0)
MCV: 96.9 fL (ref 80.0–100.0)
MONOS PCT: 12.7 %
MPV: 10.2 fL (ref 7.5–12.5)
NEUTROS ABS: 2686 {cells}/uL (ref 1500–7800)
Neutrophils Relative %: 58.4 %
Platelets: 272 10*3/uL (ref 140–400)
RBC: 4.51 10*6/uL (ref 3.80–5.10)
RDW: 12.4 % (ref 11.0–15.0)
Total Lymphocyte: 24.8 %
WBC mixed population: 584 cells/uL (ref 200–950)
WBC: 4.6 10*3/uL (ref 3.8–10.8)

## 2018-05-15 LAB — COMPLETE METABOLIC PANEL WITH GFR
AG Ratio: 1.6 (calc) (ref 1.0–2.5)
ALBUMIN MSPROF: 3.9 g/dL (ref 3.6–5.1)
ALKALINE PHOSPHATASE (APISO): 120 U/L (ref 33–130)
ALT: 11 U/L (ref 6–29)
AST: 18 U/L (ref 10–35)
BUN: 8 mg/dL (ref 7–25)
CHLORIDE: 102 mmol/L (ref 98–110)
CO2: 30 mmol/L (ref 20–32)
CREATININE: 0.67 mg/dL (ref 0.60–0.93)
Calcium: 9 mg/dL (ref 8.6–10.4)
GFR, Est African American: 102 mL/min/{1.73_m2} (ref >=60)
GFR, Est Non African American: 88 mL/min/{1.73_m2} (ref >=60)
GLUCOSE: 84 mg/dL (ref 65–99)
Globulin: 2.4 g/dL (calc) (ref 1.9–3.7)
Potassium: 4.8 mmol/L (ref 3.5–5.3)
Sodium: 140 mmol/L (ref 135–146)
Total Bilirubin: 0.3 mg/dL (ref 0.2–1.2)
Total Protein: 6.3 g/dL (ref 6.1–8.1)

## 2018-05-15 LAB — LIPID PANEL
CHOL/HDL RATIO: 3.3 (calc) (ref <5.0)
CHOLESTEROL: 210 mg/dL — AB (ref <200)
HDL: 63 mg/dL (ref >50)
LDL CHOLESTEROL (CALC): 122 mg/dL — AB
NON-HDL CHOLESTEROL (CALC): 147 mg/dL — AB (ref <130)
Triglycerides: 140 mg/dL (ref <150)

## 2018-05-15 LAB — HEMOGLOBIN A1C
HEMOGLOBIN A1C: 4.9 %{Hb} (ref <5.7)
Mean Plasma Glucose: 94 (calc)
eAG (mmol/L): 5.2 (calc)

## 2018-05-15 LAB — VITAMIN D 25 HYDROXY (VIT D DEFICIENCY, FRACTURES): Vit D, 25-Hydroxy: 58 ng/mL (ref 30–100)

## 2018-05-30 ENCOUNTER — Ambulatory Visit: Payer: Medicare Other | Admitting: Gastroenterology

## 2018-06-03 DIAGNOSIS — K439 Ventral hernia without obstruction or gangrene: Secondary | ICD-10-CM | POA: Diagnosis not present

## 2018-06-05 DIAGNOSIS — Z79899 Other long term (current) drug therapy: Secondary | ICD-10-CM | POA: Diagnosis not present

## 2018-06-13 ENCOUNTER — Telehealth: Payer: Self-pay | Admitting: Surgery

## 2018-06-13 ENCOUNTER — Other Ambulatory Visit: Payer: Self-pay

## 2018-06-13 DIAGNOSIS — Z1211 Encounter for screening for malignant neoplasm of colon: Secondary | ICD-10-CM

## 2018-06-13 NOTE — Telephone Encounter (Signed)
I have called patient's husband and advised him that patient already has an active referral to [facility]. Patient did have an appointment on 05/30/18, however it was cancelled by patient. The office number for [facility] has been given to the patient's husband to contact for an appointment.

## 2018-06-13 NOTE — Telephone Encounter (Signed)
[name] would like to discuss referral to get colonoscopy done - please advise

## 2018-06-17 ENCOUNTER — Other Ambulatory Visit: Payer: Self-pay

## 2018-06-17 MED ORDER — NA SULFATE-K SULFATE-MG SULF 17.5-3.13-1.6 GM/177ML PO SOLN: 1.0000 | Freq: Once | ORAL | 0 refills | Status: AC

## 2018-06-24 ENCOUNTER — Encounter: Payer: Self-pay | Admitting: *Deleted

## 2018-06-25 ENCOUNTER — Ambulatory Visit: Payer: Medicare Other | Admitting: Registered Nurse

## 2018-06-25 ENCOUNTER — Encounter
Admission: RE | Disposition: A | Discharge status: Home / Self Care | Payer: Self-pay | Source: Ambulatory Visit | Attending: Gastroenterology

## 2018-06-25 ENCOUNTER — Encounter: Payer: Self-pay | Admitting: *Deleted

## 2018-06-25 ENCOUNTER — Ambulatory Visit
Admission: RE | Admit: 2018-06-25 | Discharge: 2018-06-25 | Disposition: A | Discharge status: Home / Self Care | Payer: Medicare Other | Source: Ambulatory Visit | Attending: Gastroenterology | Admitting: Gastroenterology

## 2018-06-25 DIAGNOSIS — Z885 Allergy status to narcotic agent status: Secondary | ICD-10-CM | POA: Diagnosis not present

## 2018-06-25 DIAGNOSIS — F419 Anxiety disorder, unspecified: Secondary | ICD-10-CM | POA: Diagnosis not present

## 2018-06-25 DIAGNOSIS — Z803 Family history of malignant neoplasm of breast: Secondary | ICD-10-CM | POA: Diagnosis not present

## 2018-06-25 DIAGNOSIS — K219 Gastro-esophageal reflux disease without esophagitis: Secondary | ICD-10-CM | POA: Insufficient documentation

## 2018-06-25 DIAGNOSIS — E785 Hyperlipidemia, unspecified: Secondary | ICD-10-CM | POA: Diagnosis not present

## 2018-06-25 DIAGNOSIS — H9113 Presbycusis, bilateral: Secondary | ICD-10-CM | POA: Diagnosis not present

## 2018-06-25 DIAGNOSIS — K5730 Diverticulosis of large intestine without perforation or abscess without bleeding: Secondary | ICD-10-CM | POA: Diagnosis not present

## 2018-06-25 DIAGNOSIS — Z79899 Other long term (current) drug therapy: Secondary | ICD-10-CM | POA: Diagnosis not present

## 2018-06-25 DIAGNOSIS — K635 Polyp of colon: Secondary | ICD-10-CM | POA: Diagnosis not present

## 2018-06-25 DIAGNOSIS — F319 Bipolar disorder, unspecified: Secondary | ICD-10-CM | POA: Diagnosis not present

## 2018-06-25 DIAGNOSIS — D122 Benign neoplasm of ascending colon: Secondary | ICD-10-CM | POA: Diagnosis not present

## 2018-06-25 DIAGNOSIS — K5790 Diverticulosis of intestine, part unspecified, without perforation or abscess without bleeding: Secondary | ICD-10-CM | POA: Diagnosis not present

## 2018-06-25 DIAGNOSIS — J449 Chronic obstructive pulmonary disease, unspecified: Secondary | ICD-10-CM | POA: Insufficient documentation

## 2018-06-25 DIAGNOSIS — K621 Rectal polyp: Secondary | ICD-10-CM | POA: Diagnosis not present

## 2018-06-25 DIAGNOSIS — Z7951 Long term (current) use of inhaled steroids: Secondary | ICD-10-CM | POA: Diagnosis not present

## 2018-06-25 DIAGNOSIS — Z1211 Encounter for screening for malignant neoplasm of colon: Secondary | ICD-10-CM | POA: Insufficient documentation

## 2018-06-25 HISTORY — PX: COLONOSCOPY WITH PROPOFOL: SHX5780

## 2018-06-25 SURGERY — COLONOSCOPY WITH PROPOFOL
Anesthesia: General

## 2018-06-25 MED ORDER — SODIUM CHLORIDE 0.9 % IV SOLN
INTRAVENOUS | Status: DC
  Administered 2018-06-25: 1000 mL via INTRAVENOUS

## 2018-06-25 MED ORDER — PROPOFOL 10 MG/ML IV BOLUS
INTRAVENOUS | Status: DC | PRN
  Administered 2018-06-25: 60 mg via INTRAVENOUS
  Administered 2018-06-25: 30 mg via INTRAVENOUS

## 2018-06-25 MED ORDER — PROPOFOL 500 MG/50ML IV EMUL
INTRAVENOUS | Status: DC | PRN
  Administered 2018-06-25: 140 ug/kg/min via INTRAVENOUS

## 2018-06-25 MED ORDER — LIDOCAINE HCL (CARDIAC) PF 100 MG/5ML IV SOSY
PREFILLED_SYRINGE | INTRAVENOUS | Status: DC | PRN
  Administered 2018-06-25: 40 mg via INTRAVENOUS

## 2018-06-25 NOTE — Anesthesia Post-op Follow-up Note (Signed)
Anesthesia QCDR form completed.        

## 2018-06-25 NOTE — Anesthesia Preprocedure Evaluation (Signed)
Anesthesia Evaluation  Patient identified by MRN, date of birth, ID band Patient awake    Reviewed: Allergy & Precautions, H&P , NPO status , Patient's Chart, lab work & pertinent test results  History of Anesthesia Complications Negative for: history of anesthetic complications  Airway Mallampati: III  TM Distance: <3 FB Neck ROM: limited    Dental  (+) Chipped, Poor Dentition, Missing, Partial Upper   Pulmonary neg shortness of breath, COPD,           Cardiovascular Exercise Tolerance: Good (-) angina(-) Past MI and (-) DOE negative cardio ROS       Neuro/Psych PSYCHIATRIC DISORDERS Anxiety Bipolar Disorder negative neurological ROS     GI/Hepatic Neg liver ROS, GERD  Medicated and Controlled,  Endo/Other  negative endocrine ROS  Renal/GU negative Renal ROS  negative genitourinary   Musculoskeletal  (+) Arthritis ,   Abdominal   Peds  Hematology negative hematology ROS (+)   Anesthesia Other Findings Past Medical History: No date: Abdominal hernia with obstruction and without gangrene 02/15/2017: Anxiety 02/15/2017: Bipolar 1 disorder, depressed, full remission ([place]) No date: Bipolar affective disorder ([place]) 02/15/2017: COPD (chronic obstructive pulmonary disease) ([place]) 02/15/2017: GERD (gastroesophageal reflux disease) No date: Glaucoma 05/21/2017: History of abdominal hernia 02/15/2017: History of compression fracture of spine No date: Hyperlipidemia 06/02/2017: Incisional ventral hernia w obstruction 05/23/2017: Normocytic anemia 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, 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  Past Surgical History: 11/06/2006: ABDOMINAL HYSTERECTOMY No date: KYPHOPLASTY 06/02/2017: VENTRAL HERNIA REPAIR; N/A     Comment:  Procedure: exploratory laparotomy repair of incarcerated              VENTRAL hernia;  Surgeon: Florene Glen, MD;                Location: [facility] ORS;  Service:  General;  Laterality: N/A;  BMI    Body Mass Index:  26.75 kg/m      Reproductive/Obstetrics negative OB ROS                             Anesthesia Physical Anesthesia Plan  ASA: III  Anesthesia Plan: General   Post-op Pain Management:    Induction: Intravenous  PONV Risk Score and Plan: Propofol infusion and TIVA  Airway Management Planned: Natural Airway and Nasal Cannula  Additional Equipment:   Intra-op Plan:   Post-operative Plan:   Informed Consent: I have reviewed the patients History and Physical, chart, labs and discussed the procedure including the risks, benefits and alternatives for the proposed anesthesia with the patient or authorized representative who has indicated his/her understanding and acceptance.   Dental Advisory Given  Plan Discussed with: Anesthesiologist, CRNA and Surgeon  Anesthesia Plan Comments: (Patient consented for risks of anesthesia including but not limited to:  - adverse reactions to medications - risk of intubation if required - damage to teeth, lips or other oral mucosa - sore throat or hoarseness - Damage to heart, brain, lungs or loss of life  Patient voiced understanding.)        Anesthesia Quick Evaluation

## 2018-06-25 NOTE — Anesthesia Postprocedure Evaluation (Signed)
Anesthesia Post Note  Patient: Pamela Ball  Procedure(s) Performed: COLONOSCOPY WITH PROPOFOL (N/A )  Patient location during evaluation: Endoscopy Anesthesia Type: General Level of consciousness: awake and alert Pain management: pain level controlled Vital Signs Assessment: post-procedure vital signs reviewed and stable Respiratory status: spontaneous breathing, nonlabored ventilation, respiratory function stable and patient connected to nasal cannula oxygen Cardiovascular status: blood pressure returned to baseline and stable Postop Assessment: no apparent nausea or vomiting Anesthetic complications: no     Last Vitals:  Vitals:   06/25/18 0937 06/25/18 0955  BP: 136/84 (!) 152/94  Pulse: 83   Resp: 15   Temp: 36.7 C   SpO2: 99%     Last Pain:  Vitals:   06/25/18 0955  TempSrc:   PainSc: 0-No pain                 Precious Haws [place]

## 2018-06-25 NOTE — Transfer of Care (Signed)
Immediate Anesthesia Transfer of Care Note  Patient: Pamela Ball  Procedure(s) Performed: COLONOSCOPY WITH PROPOFOL (N/A )  Patient Location: PACU  Anesthesia Type:General  Level of Consciousness: sedated  Airway & Oxygen Therapy: Patient Spontanous Breathing and Patient connected to nasal cannula oxygen  Post-op Assessment: Report given to RN and Post -op Vital signs reviewed and stable  Post vital signs: Reviewed and stable  Last Vitals:  Vitals Value Taken Time  BP 136/84 06/25/2018  9:37 AM  Temp 36.7 C 06/25/2018  9:37 AM  Pulse 85 06/25/2018  9:37 AM  Resp 15 06/25/2018  9:37 AM  SpO2 98 % 06/25/2018  9:37 AM  Vitals shown include unvalidated device data.  Last Pain:  Vitals:   06/25/18 0935  TempSrc: Tympanic  PainSc: 0-No pain         Complications: No apparent anesthesia complications

## 2018-06-25 NOTE — Op Note (Signed)
[facility] Gastroenterology Patient Name: Pamela Ball Procedure Date: 06/25/2018 9:06 AM MRN: [id number] Account #: [id number] Date of Birth: 1946-10-27 Admit Type: Outpatient Age: 72 Room: [facility] ENDO ROOM 1 Gender: Female Note Status: Finalized Procedure:            Colonoscopy Indications:          Screening for colorectal malignant neoplasm Providers:            Jonathon Bellows MD, MD Referring MD:         Olin Hauser (Referring MD) Medicines:            Monitored Anesthesia Care Complications:        No immediate complications. Procedure:            Pre-Anesthesia Assessment:                       - Prior to the procedure, a History and Physical was                        performed, and patient medications, allergies and                        sensitivities were reviewed. The patient's tolerance of                        previous anesthesia was reviewed.                       - The risks and benefits of the procedure and the                        sedation options and risks were discussed with the                        patient. All questions were answered and informed                        consent was obtained.                       - ASA Grade Assessment: II - A patient with mild                        systemic disease.                       After obtaining informed consent, the colonoscope was                        passed under direct vision. Throughout the procedure,                        the patient's blood pressure, pulse, and oxygen                        saturations were monitored continuously. The                        Colonoscope was introduced through the anus and  advanced to the the cecum, identified by the                        appendiceal orifice, IC valve and transillumination.                        The colonoscopy was performed with ease. The patient                        tolerated the procedure well.  The quality of the bowel                        preparation was good. Findings:      The perianal and digital rectal examinations were normal.      Multiple small-mouthed diverticula were found in the sigmoid colon.      Two sessile polyps were found in the rectum and ascending colon. The       polyps were 3 to 4 mm in size. These polyps were removed with a cold       biopsy forceps. Resection and retrieval were complete.      A 5 mm polyp was found in the distal ascending colon. The polyp was       sessile. The polyp was removed with a cold snare. Resection and       retrieval were complete.      The exam was otherwise without abnormality on direct and retroflexion       views. Impression:           - Diverticulosis in the sigmoid colon.                       - Two 3 to 4 mm polyps in the rectum and in the                        ascending colon, removed with a cold biopsy forceps.                        Resected and retrieved.                       - One 5 mm polyp in the distal ascending colon, removed                        with a cold snare. Resected and retrieved.                       - The examination was otherwise normal on direct and                        retroflexion views. Recommendation:       - Discharge patient to home (with escort).                       - Resume previous diet.                       - Continue present medications.                       - Await pathology results.                       -  Repeat colonoscopy in 3 - 5 years for surveillance                        based on pathology results. Procedure Code(s):    --- Professional ---                       [phone], Colonoscopy, flexible; with removal of tumor(s),                        polyp(s), or other lesion(s) by snare technique                       45380, 15, Colonoscopy, flexible; with biopsy, single                        or multiple Diagnosis Code(s):    --- Professional ---                        Z12.11, Encounter for screening for malignant neoplasm                        of colon                       K62.1, Rectal polyp                       D12.2, Benign neoplasm of ascending colon                       K57.30, Diverticulosis of large intestine without                        perforation or abscess without bleeding CPT copyright 2017 American Medical Association. All rights reserved. The codes documented in this report are preliminary and upon coder review may  be revised to meet current compliance requirements. Jonathon Bellows, MD Jonathon Bellows MD, MD 06/25/2018 9:34:33 AM This report has been signed electronically. Number of Addenda: 0 Note Initiated On: 06/25/2018 9:06 AM Scope Withdrawal Time: 0 hours 13 minutes 49 seconds  Total Procedure Duration: 0 hours 18 minutes 21 seconds       [facility]

## 2018-06-25 NOTE — H&P (Signed)
Jonathon Bellows, MD [address] [place], [place], [place], [address] [address], [place], [place], [place], [address] Phone: [phone]  Fax: [phone]  Primary Care Physician:  Olin Hauser, DO   Pre-Procedure History & Physical: HPI:  Pamela Ball is a 72 y.o. female is here for an colonoscopy.   Past Medical History:  Diagnosis Date  . Abdominal hernia with obstruction and without gangrene   . Anxiety 02/15/2017  . Bipolar 1 disorder, depressed, full remission ([place]) 02/15/2017  . Bipolar affective disorder ([place])   . COPD (chronic obstructive pulmonary disease) ([place]) 02/15/2017  . GERD (gastroesophageal reflux disease) 02/15/2017  . Glaucoma   . History of abdominal hernia 05/21/2017  . History of compression fracture of spine 02/15/2017  . Hyperlipidemia   . Incisional ventral hernia w obstruction 06/02/2017  . Normocytic anemia 05/23/2017  . 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 Presbycusis of both ears 02/15/2017    Past Surgical History:  Procedure Laterality Date  . ABDOMINAL HYSTERECTOMY  11/06/2006  . KYPHOPLASTY    . VENTRAL HERNIA REPAIR N/A 06/02/2017   Procedure: exploratory laparotomy repair of incarcerated  VENTRAL hernia;  Surgeon: Florene Glen, MD;  Location: [facility] ORS;  Service: General;  Laterality: N/A;    Prior to Admission medications   Medication Sig Start Date End Date Taking? Authorizing Provider  buPROPion (WELLBUTRIN XL) 150 MG 24 hr tablet Take 150 mg by mouth. 01/11/15   [provider]  busPIRone (BUSPAR) 10 MG tablet Take 1 tablet (10 mg total) by mouth daily. Prescribed by Psychiatry Dr Kasandra Knudsen 11/14/17   Karamalegos, Devonne Doughty, DO  fluticasone (FLONASE) 50 MCG/ACT nasal spray Place 2 sprays into both nostrils daily. Use for 4-6 weeks then stop and use seasonally or as needed. 05/14/18   Karamalegos, Devonne Doughty, DO  Multiple Vitamin (MULTI-VITAMINS) TABS Take by mouth.    [provider]    OLANZapine (ZYPREXA) 5 MG tablet Take 5 mg by mouth at bedtime.  01/11/15   [provider]  omeprazole (PRILOSEC) 20 MG capsule Take 1 capsule (20 mg total) by mouth daily before breakfast. 05/14/18   Parks Ranger, Devonne Doughty, DO  simvastatin (ZOCOR) 20 MG tablet Take 1 tablet (20 mg total) by mouth daily. 05/14/18   Parks Ranger, Devonne Doughty, DO  venlafaxine XR (EFFEXOR-XR) 75 MG 24 hr capsule Take 75 mg by mouth daily with breakfast.  01/11/15   [provider]    Allergies as of 06/13/2018 - Review Complete 05/14/2018  Allergen Reaction Noted  . Codeine Other (See Comments) 01/16/2016    Family History  Problem Relation Age of Onset  . Cancer Mother        cancer  . Breast cancer Mother 22    Social History   Socioeconomic History  . Marital status: Married    Spouse name: Adylee Leonardo  . Number of children: Not on file  . Years of education: [employer]  . Highest education level: Not on file  Occupational History  . Not on file  Social Needs  . Financial resource strain: Not hard at all  . Food insecurity:    Worry: Never true    Inability: Never true  . Transportation needs:    Medical: No    Non-medical: No  Tobacco Use  . Smoking status: Never Smoker  . Smokeless tobacco: Never Used  Substance and Sexual Activity  . Alcohol use: No  . Drug use: No  .  Sexual activity: Not on file  Lifestyle  . Physical activity:    Days per week: 0 days    Minutes per session: 0 min  . Stress: Not at all  Relationships  . Social connections:    Talks on phone: More than three times a week    Gets together: More than three times a week    Attends religious service: More than 4 times per year    Active member of club or organization: No    Attends meetings of clubs or organizations: Never    Relationship status: Married  . Intimate partner violence:    Fear of current or ex partner: No    Emotionally abused: No    Physically abused: No    Forced sexual  activity: No  Other Topics Concern  . Not on file  Social History Narrative  . Not on file    Review of Systems: See HPI, otherwise negative ROS  Physical Exam: BP (!) 150/102   Pulse 89   Temp 99 F (37.2 C) (Tympanic)   Resp 18   Ht 4\' 10"  (1.473 m)   Wt 58.1 kg   SpO2 97%   BMI 26.75 kg/m  General:   Alert,  pleasant and cooperative in NAD Head:  Normocephalic and atraumatic. Neck:  Supple; no masses or thyromegaly. Lungs:  Clear throughout to auscultation, normal respiratory effort.    Heart:  +S1, +S2, Regular rate and rhythm, No edema. Abdomen:  Soft, nontender and nondistended. Normal bowel sounds, without guarding, and without rebound.   Neurologic:  Alert and  oriented x4;  grossly normal neurologically.  Impression/Plan: Pamela Ball is here for an colonoscopy to be performed for Screening colonoscopy average risk   Risks, benefits, limitations, and alternatives regarding  colonoscopy have been reviewed with the patient.  Questions have been answered.  All parties agreeable.   Jonathon Bellows, MD  06/25/2018, 9:04 AM

## 2018-06-26 ENCOUNTER — Encounter: Payer: Self-pay | Admitting: Gastroenterology

## 2018-06-26 DIAGNOSIS — H353133 Nonexudative age-related macular degeneration, bilateral, advanced atrophic without subfoveal involvement: Secondary | ICD-10-CM | POA: Diagnosis not present

## 2018-06-27 LAB — SURGICAL PATHOLOGY

## 2018-06-30 ENCOUNTER — Encounter: Payer: Self-pay | Admitting: Gastroenterology

## 2018-07-01 DIAGNOSIS — H353134 Nonexudative age-related macular degeneration, bilateral, advanced atrophic with subfoveal involvement: Secondary | ICD-10-CM | POA: Diagnosis not present

## 2018-07-06 ENCOUNTER — Other Ambulatory Visit: Payer: Self-pay | Admitting: Family Medicine

## 2018-07-06 DIAGNOSIS — J3089 Other allergic rhinitis: Secondary | ICD-10-CM

## 2018-07-18 ENCOUNTER — Other Ambulatory Visit: Payer: Self-pay

## 2018-07-18 ENCOUNTER — Ambulatory Visit (INDEPENDENT_AMBULATORY_CARE_PROVIDER_SITE_OTHER): Payer: Medicare Other | Admitting: Surgery

## 2018-07-18 ENCOUNTER — Encounter: Payer: Self-pay | Admitting: Surgery

## 2018-07-18 VITALS — BP 142/90 | HR 63 | Temp 97.7°F | Resp 12 | Ht <= 58 in | Wt 131.0 lb

## 2018-07-18 DIAGNOSIS — K432 Incisional hernia without obstruction or gangrene: Secondary | ICD-10-CM | POA: Diagnosis not present

## 2018-07-18 NOTE — Patient Instructions (Signed)
Please schedule patient with Dr.Piscoya to discuss surgery to repair the ventral hernia. Ventral Hernia A ventral hernia is a bulge of tissue from inside the abdomen that pushes through a weak area of the muscles that form the front wall of the abdomen. The tissues inside the abdomen are inside a sac (peritoneum). These tissues include the small intestine, large intestine, and the fatty tissue that covers the intestines (omentum). Sometimes, the bulge that forms a hernia contains intestines. Other hernias contain only fat. Ventral hernias do not go away without surgical treatment. There are several types of ventral hernias. You may have:  A hernia at an incision site from previous abdominal surgery (incisional hernia).  A hernia just above the belly button (epigastric hernia), or at the belly button (umbilical hernia). These types of hernias can develop from heavy lifting or straining.  A hernia that comes and goes (reducible hernia). It may be visible only when you lift or strain. This type of hernia can be pushed back into the abdomen (reduced).  A hernia that traps abdominal tissue inside the hernia (incarcerated hernia). This type of hernia does not reduce.  A hernia that cuts off blood flow to the tissues inside the hernia (strangulated hernia). The tissues can start to die if this happens. This is a very painful bulge that cannot be reduced. A strangulated hernia is a medical emergency.  What are the causes? This condition is caused by abdominal tissue putting pressure on an area of weakness in the abdominal muscles. What increases the risk? The following factors may make you more likely to develop this condition:  Being female.  Being 28 or older.  Being overweight or obese.  Having had previous abdominal surgery, especially if there was an infection after surgery.  Having had an injury to the abdominal wall.  Having had several pregnancies.  Having a buildup of fluid inside the  abdomen (ascites).  What are the signs or symptoms? The only symptom of a ventral hernia may be a painless bulge in the abdomen. A reducible hernia may be visible only when you strain, cough, or lift. Other symptoms may include:  Dull pain.  A feeling of pressure.  Signs and symptoms of a strangulated hernia may include:  Increasing pain.  Nausea and vomiting.  Pain when pressing on the hernia.  The skin over the hernia turning red or purple.  Constipation.  Blood in the stool (feces).  How is this diagnosed? This condition may be diagnosed based on:  Your symptoms.  Your medical history.  A physical exam. You may be asked to cough or strain while standing. These actions increase the pressure inside your abdomen and force the hernia through the opening in your muscles. Your health care provider may try to reduce the hernia by pressing on it.  Imaging studies, such as an ultrasound or CT scan.  How is this treated? This condition is treated with surgery. If you have a strangulated hernia, surgery is done as soon as possible. If your hernia is small and not incarcerated, you may be asked to lose some weight before surgery. Follow these instructions at home:  Follow instructions from your health care provider about eating or drinking restrictions.  If you are overweight, your health care provider may recommend that you increase your activity level and eat a healthier diet.  Do not lift anything that is heavier than 10 lb (4.5 kg).  Return to your normal activities as told by your health care provider.  Ask your health care provider what activities are safe for you. You may need to avoid activities that increase pressure on your hernia.  Take over-the-counter and prescription medicines only as told by your health care provider.  Keep all follow-up visits as told by your health care provider. This is important. Contact a health care provider if:  Your hernia gets  larger.  Your hernia becomes painful. Get help right away if:  Your hernia becomes increasingly painful.  You have pain along with any of the following: ? Changes in skin color in the area of the hernia. ? Nausea. ? Vomiting. ? Fever. Summary  A ventral hernia is a bulge of tissue from inside the abdomen that pushes through a weak area of the muscles that form the front wall of the abdomen.  This condition is treated with surgery, which may be urgent depending on your hernia.  Do not lift anything that is heavier than 10 lb (4.5 kg), and follow activity instructions from your health care provider. This information is not intended to replace advice given to you by your health care provider. Make sure you discuss any questions you have with your health care provider. Document Released: 10/09/2012 Document Revised: 06/09/2016 Document Reviewed: 06/09/2016 Elsevier Interactive Patient Education  [employer].

## 2018-07-18 NOTE — Progress Notes (Addendum)
Outpatient Surgical Follow Up  07/18/2018  Pamela Ball is an 72 y.o. female.   CC: Ventral hernia  HPI: This patient with a recurrent ventral hernia.  Previously she had a primary repair due to incarceration through the emergency room.  Recent colonoscopy revealed benign polyps.  Patient has been to the [facility] hernia center for evaluation.  I am attempting to retrieve those records for review.  Those results were reviewed where they recommended weight loss smoking cessation and ultimate repair at some point.  They needed to review her CT scans and recommended a colonoscopy which has been completed.  Patient has no abdominal pain no nausea vomiting fevers or chills.  She has a known lumbar hernia and hiatal hernia but has this recurrent abdominal hernia in the periumbilical area.  Past Medical History:  Diagnosis Date  . Abdominal hernia with obstruction and without gangrene   . Anxiety 02/15/2017  . Bipolar 1 disorder, depressed, full remission ([place]) 02/15/2017  . Bipolar affective disorder ([place])   . COPD (chronic obstructive pulmonary disease) ([place]) 02/15/2017  . GERD (gastroesophageal reflux disease) 02/15/2017  . Glaucoma   . History of abdominal hernia 05/21/2017  . History of compression fracture of spine 02/15/2017  . Hyperlipidemia   . Incisional ventral hernia w obstruction 06/02/2017  . Normocytic anemia 05/23/2017  . 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 Presbycusis of both ears 02/15/2017    Past Surgical History:  Procedure Laterality Date  . ABDOMINAL HYSTERECTOMY  11/06/2006  . COLONOSCOPY WITH PROPOFOL N/A 06/25/2018   Procedure: COLONOSCOPY WITH PROPOFOL;  Surgeon: Jonathon Bellows, MD;  Location: [facility] ENDOSCOPY;  Service: Endoscopy;  Laterality: N/A;  . KYPHOPLASTY    . VENTRAL HERNIA REPAIR N/A 06/02/2017   Procedure: exploratory laparotomy repair of incarcerated  VENTRAL hernia;  Surgeon: Florene Glen, MD;  Location: [facility] ORS;  Service: General;   Laterality: N/A;    Family History  Problem Relation Age of Onset  . Cancer Mother        cancer  . Breast cancer Mother 44    Social History:  reports that she has never smoked. She has never used smokeless tobacco. She reports that she does not drink alcohol or use drugs.  Allergies:  Allergies  Allergen Reactions  . Codeine Other (See Comments)    Reaction: unknown Hasn't had a reaction in years    Medications reviewed.   Review of Systems:   Review of Systems  Constitutional: Negative.   HENT: Negative.   Eyes: Negative.   Respiratory: Negative.   Cardiovascular: Negative.   Gastrointestinal: Negative for abdominal pain, diarrhea, heartburn, nausea and vomiting.  Genitourinary: Negative.   Musculoskeletal: Negative.   Skin: Negative.   Neurological: Negative.   Endo/Heme/Allergies: Negative.   Psychiatric/Behavioral: Negative.      Physical Exam:  There were no vitals taken for this visit.  Physical Exam  Constitutional: She is oriented to person, place, and time. She appears well-developed and well-nourished. No distress.  HENT:  Head: Normocephalic and atraumatic.  Eyes: Pupils are equal, round, and reactive to light. EOM are normal. Right eye exhibits no discharge. Left eye exhibits no discharge. No scleral icterus.  Neck: Normal range of motion. Neck supple.  Pulmonary/Chest: Effort normal. No respiratory distress.  Abdominal: Soft. She exhibits no distension and no mass. There is no tenderness. There is no guarding.  Long midline scar with approximately 5 cm recurrent ventral hernia in the periumbilical area to the left of the midline.  It is reducible and nontender no overlying skin changes  Musculoskeletal: Normal range of motion. She exhibits edema. She exhibits no deformity.  Neurological: She is alert and oriented to person, place, and time.  Skin: She is not diaphoretic.  Vitals reviewed.     No results found for this or any previous visit  (from the past 48 hour(s)). No results found.  Assessment/Plan:  This patient with a recurrent ventral hernia.  It was previously repaired primarily at the time of an acute incarceration.  Her recurrence will require repair and she wishes to proceed at this time.  She has seen [facility] the hernia center in the department of surgery, she has seen Dr. Windell Moment, does not wish to go back to [facility] clinic nor [facility]. Unfortunately with my departure from [facility] she will have to see 1 of my partners and I have referred her to Dr. Hampton Abbot for repair.  I described for her the repair with mesh and the risk of bleeding infection mesh placement mesh infection and bowel injury.  Her son was present questions were answered she will see Dr. Hampton Abbot for evaluation.  Florene Glen, MD, FACS  Addendum: I discussed the patient's smoking use as noted in her [facility] chart where they had recommended that she stop smoking.  Patient states she is a non-smoker and has never been a smoker.

## 2018-07-24 ENCOUNTER — Encounter: Payer: Self-pay | Admitting: Surgery

## 2018-07-24 ENCOUNTER — Ambulatory Visit (INDEPENDENT_AMBULATORY_CARE_PROVIDER_SITE_OTHER): Payer: Medicare Other | Admitting: Surgery

## 2018-07-24 ENCOUNTER — Encounter: Payer: Self-pay | Admitting: *Deleted

## 2018-07-24 VITALS — BP 162/92 | HR 90 | Temp 98.1°F | Resp 12 | Ht <= 58 in | Wt 131.0 lb

## 2018-07-24 DIAGNOSIS — K430 Incisional hernia with obstruction, without gangrene: Secondary | ICD-10-CM

## 2018-07-24 NOTE — Progress Notes (Signed)
Patient's surgery has been scheduled for 08-29-18 at [facility] with Dr. Hampton Abbot.  The patient's spouse, [name], has been notified of Pre-admit appointment date and time.   Patient has also been scheduled for a pre-op visit with Dr. Hampton Abbot for 08-21-18. The patient is aware.   They have been instructed to call the office should they have further questions.

## 2018-07-24 NOTE — Progress Notes (Signed)
07/24/2018  History of Present Illness: Pamela Ball is a 72 y.o. female s/p incarcerated ventral hernia repair with Dr. Burt Knack 06/02/17.  Her hernia has recurred and she has undergone workup with [facility] for her hernia.  She had a CT scan on 03/05/18 which showed a ventral hernia with small bowel loops and a right lumber hernia with fat tissue.  She reports that her ventral hernia is really asymptomatic but she does not want the risk of it having incarceration like last year and would like this repaired.  She does have some discomfort with the lumbar hernia but is more worried about the ventral one.  Denies any nausea, vomiting, abdominal pain.  Past Medical History: Past Medical History:  Diagnosis Date  . Abdominal hernia with obstruction and without gangrene   . Anxiety 02/15/2017  . Bipolar 1 disorder, depressed, full remission ([place]) 02/15/2017  . Bipolar affective disorder ([place])   . COPD (chronic obstructive pulmonary disease) ([place]) 02/15/2017  . GERD (gastroesophageal reflux disease) 02/15/2017  . Glaucoma   . History of abdominal hernia 05/21/2017  . History of compression fracture of spine 02/15/2017  . Hyperlipidemia   . Incisional ventral hernia w obstruction 06/02/2017  . Normocytic anemia 05/23/2017  . 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 Presbycusis of both ears 02/15/2017     Past Surgical History: Past Surgical History:  Procedure Laterality Date  . ABDOMINAL HYSTERECTOMY  11/06/2006  . CESAREAN SECTION     x3  . COLONOSCOPY WITH PROPOFOL N/A 06/25/2018   Procedure: COLONOSCOPY WITH PROPOFOL;  Surgeon: Jonathon Bellows, MD;  Location: [facility] ENDOSCOPY;  Service: Endoscopy;  Laterality: N/A;  . KYPHOPLASTY    . VENTRAL HERNIA REPAIR N/A 06/02/2017   Procedure: exploratory laparotomy repair of incarcerated  VENTRAL hernia;  Surgeon: Florene Glen, MD;  Location: [facility] ORS;  Service: General;  Laterality: N/A;    Home Medications: Prior to Admission medications    Medication Sig Start Date End Date Taking? Authorizing Provider  buPROPion (WELLBUTRIN XL) 150 MG 24 hr tablet Take 150 mg by mouth. 01/11/15  Yes [provider]  busPIRone (BUSPAR) 10 MG tablet Take 1 tablet (10 mg total) by mouth daily. Prescribed by Psychiatry Dr Kasandra Knudsen 11/14/17  Yes Karamalegos, Devonne Doughty, DO  fluticasone (FLONASE) 50 MCG/ACT nasal spray PLACE 2 SPRAYS INTO BOTH NOSTRILS DAILY. USE FOR 4-6 WEEKS THEN STOP AND USE SEASONALLY OR AS NEEDED 07/07/18  Yes Karamalegos, Devonne Doughty, DO  Multiple Vitamin (MULTI-VITAMINS) TABS Take by mouth.   Yes [provider]  OLANZapine (ZYPREXA) 5 MG tablet Take 5 mg by mouth at bedtime.  01/11/15  Yes [provider]  omeprazole (PRILOSEC) 20 MG capsule Take 1 capsule (20 mg total) by mouth daily before breakfast. 05/14/18  Yes Karamalegos, Devonne Doughty, DO  simvastatin (ZOCOR) 20 MG tablet Take 1 tablet (20 mg total) by mouth daily. 05/14/18  Yes Karamalegos, Devonne Doughty, DO  venlafaxine XR (EFFEXOR-XR) 75 MG 24 hr capsule Take 75 mg by mouth daily with breakfast.  01/11/15  Yes [provider]    Allergies: No Known Allergies  Review of Systems: Review of Systems  Constitutional: Negative for chills and fever.  Respiratory: Negative for shortness of breath.   Cardiovascular: Negative for chest pain.  Gastrointestinal: Negative for abdominal pain.    Physical Exam BP (!) 162/92   Pulse 90   Temp 98.1 F (36.7 C) (Skin)   Resp 12   Ht 4\' 10"  (1.473 m)  Wt 131 lb (59.4 kg)   SpO2 95%   BMI 27.38 kg/m  CONSTITUTIONAL: No acute distress HEENT:  Normocephalic, atraumatic, extraocular motion intact. RESPIRATORY:  Lungs are clear, and breath sounds are equal bilaterally. Normal respiratory effort without pathologic use of accessory muscles. CARDIOVASCULAR: Heart is regular without murmurs, gallops, or rubs. GI: The abdomen is soft, nondistended, nontender to palpation.  Midline incision has healed well, but the  patient does have an easily reducible incisional hernia at the superior portion.  NEUROLOGIC:  Motor and sensation is grossly normal.  Cranial nerves are grossly intact. PSYCH:  Alert and oriented to person, place and time. Affect is normal.  Labs/Imaging: CT scan 03/05/18: IMPRESSION: 1. Periumbilical ventral hernia eccentric to the left containing small bowel loops without strangulation or obstruction. 2. Right lumbar hernia containing only adipose tissue. 3. Other imaging findings of potential clinical significance: Moderate to large type 3 hiatal hernia. Aortic Atherosclerosis (ICD10-I70.0). Simple appearing left kidney upper pole cyst. Large periampullary and transverse duodenal diverticula without signs of inflammation. Descending and sigmoid colon diverticulosis. Prior vertebral augmentations at L1 and L2 with chronic kyphosis and compression, and prominent central narrowing of the thecal sac at L1 due to chronic bony retropulsion.  Labs 05/14/18: Overall normal CBC, CMP.  Assessment and Plan: This is a 72 y.o. female with an asymptomatic incisional hernia.  I have independently viewed the patient's imaging study and discussed it with her.  She does have an incisional hernia with small bowel loops and a lumbar hernia on the right which contains fat.  The patient wants to proceed with incisional hernia repair.  Discussed with the patient the possibility of doing this laparoscopically with mesh placement.  All would depend on the amount of scar tissue present given her prior surgeries.  If possible, we would attempt this way.  But the patient does know that if too much scarring we would convert to an open incisional hernia repair.  Discussed hospital stay, post-op outcomes and expectations with the patient and she's willing to proceed.  Given that I will not be available from 9/26 to 10/15, we will schedule her for the week of 10/21 and have her come back before for H&P update.  Face-to-face  time spent with the patient and care providers was 25 minutes, with more than 50% of the time spent counseling, educating, and coordinating care of the patient.     Melvyn Neth, [facility]

## 2018-07-24 NOTE — Patient Instructions (Addendum)
Schedule recurrent ventral hernia repair   Please review the instructions below. If you develop any signs or symptoms of a strangulated hernia, please go to the Emergency Room Immediately. These symptoms are highlighted below under, "GET HELP RIGHT AWAY IF:"  If you have any questions or concerns, please call our office and speak with a nurse.    Ventral Hernia A ventral hernia is a bulge of tissue from inside the abdomen that pushes through a weak area of the muscles that form the front wall of the abdomen. The tissues inside the abdomen are inside a sac (peritoneum). These tissues include the small intestine, large intestine, and the fatty tissue that covers the intestines (omentum). Sometimes, the bulge that forms a hernia contains intestines. Other hernias contain only fat. Ventral hernias do not go away without surgical treatment. There are several types of ventral hernias. You may have:  A hernia at an incision site from previous abdominal surgery (incisional hernia).  A hernia just above the belly button (epigastric hernia), or at the belly button (umbilical hernia). These types of hernias can develop from heavy lifting or straining.  A hernia that comes and goes (reducible hernia). It may be visible only when you lift or strain. This type of hernia can be pushed back into the abdomen (reduced).  A hernia that traps abdominal tissue inside the hernia (incarcerated hernia). This type of hernia does not reduce.  A hernia that cuts off blood flow to the tissues inside the hernia (strangulated hernia). The tissues can start to die if this happens. This is a very painful bulge that cannot be reduced. A strangulated hernia is a medical emergency.  What are the causes? This condition is caused by abdominal tissue putting pressure on an area of weakness in the abdominal muscles. What increases the risk? The following factors may make you more likely to develop this condition:  Being  female.  Being 36 or older.  Being overweight or obese.  Having had previous abdominal surgery, especially if there was an infection after surgery.  Having had an injury to the abdominal wall.  Having had several pregnancies.  Having a buildup of fluid inside the abdomen (ascites).  What are the signs or symptoms? The only symptom of a ventral hernia may be a painless bulge in the abdomen. A reducible hernia may be visible only when you strain, cough, or lift. Other symptoms may include:  Dull pain.  A feeling of pressure.  Signs and symptoms of a strangulated hernia may include:  Increasing pain.  Nausea and vomiting.  Pain when pressing on the hernia.  The skin over the hernia turning red or purple.  Constipation.  Blood in the stool (feces).  How is this diagnosed? This condition may be diagnosed based on:  Your symptoms.  Your medical history.  A physical exam. You may be asked to cough or strain while standing. These actions increase the pressure inside your abdomen and force the hernia through the opening in your muscles. Your health care provider may try to reduce the hernia by pressing on it.  Imaging studies, such as an ultrasound or CT scan.  How is this treated? This condition is treated with surgery. If you have a strangulated hernia, surgery is done as soon as possible. If your hernia is small and not incarcerated, you may be asked to lose some weight before surgery. Follow these instructions at home:  Follow instructions from your health care provider about eating or drinking restrictions.  If you are overweight, your health care provider may recommend that you increase your activity level and eat a healthier diet.  Do not lift anything that is heavier than 10 lb (4.5 kg).  Return to your normal activities as told by your health care provider. Ask your health care provider what activities are safe for you. You may need to avoid activities that  increase pressure on your hernia.  Take over-the-counter and prescription medicines only as told by your health care provider.  Keep all follow-up visits as told by your health care provider. This is important. Contact a health care provider if:  Your hernia gets larger.  Your hernia becomes painful. Get help right away if:  Your hernia becomes increasingly painful.  You have pain along with any of the following: ? Changes in skin color in the area of the hernia. ? Nausea. ? Vomiting. ? Fever. Summary  A ventral hernia is a bulge of tissue from inside the abdomen that pushes through a weak area of the muscles that form the front wall of the abdomen.  This condition is treated with surgery, which may be urgent depending on your hernia.  Do not lift anything that is heavier than 10 lb (4.5 kg), and follow activity instructions from your health care provider. This information is not intended to replace advice given to you by your health care provider. Make sure you discuss any questions you have with your health care provider. Document Released: 10/09/2012 Document Revised: 06/09/2016 Document Reviewed: 06/09/2016 Elsevier Interactive Patient Education  [employer].

## 2018-08-21 ENCOUNTER — Inpatient Hospital Stay: Admission: RE | Admit: 2018-08-21 | Payer: Medicare Other | Source: Ambulatory Visit

## 2018-08-21 ENCOUNTER — Ambulatory Visit: Payer: Medicare Other | Admitting: Surgery

## 2018-08-21 NOTE — Progress Notes (Deleted)
Called patient @ 11:48 a.m and left message on her cell phone to call the office in regards to her 10:30 a.m appt. Per Dr.Piscoya if she calls to be seen, or if she comes into the office today he will see her.

## 2018-08-27 ENCOUNTER — Other Ambulatory Visit: Payer: Self-pay

## 2018-08-27 ENCOUNTER — Encounter
Admission: RE | Admit: 2018-08-27 | Discharge: 2018-08-27 | Disposition: A | Discharge status: Home / Self Care | Payer: Medicare Other | Source: Ambulatory Visit | Attending: Surgery | Admitting: Surgery

## 2018-08-27 DIAGNOSIS — F419 Anxiety disorder, unspecified: Secondary | ICD-10-CM | POA: Diagnosis not present

## 2018-08-27 DIAGNOSIS — F319 Bipolar disorder, unspecified: Secondary | ICD-10-CM | POA: Diagnosis not present

## 2018-08-27 DIAGNOSIS — Z01818 Encounter for other preprocedural examination: Secondary | ICD-10-CM

## 2018-08-27 DIAGNOSIS — E785 Hyperlipidemia, unspecified: Secondary | ICD-10-CM | POA: Diagnosis not present

## 2018-08-27 DIAGNOSIS — K219 Gastro-esophageal reflux disease without esophagitis: Secondary | ICD-10-CM | POA: Diagnosis not present

## 2018-08-27 DIAGNOSIS — J449 Chronic obstructive pulmonary disease, unspecified: Secondary | ICD-10-CM | POA: Insufficient documentation

## 2018-08-27 DIAGNOSIS — M170 Bilateral primary osteoarthritis of knee: Secondary | ICD-10-CM | POA: Diagnosis not present

## 2018-08-27 DIAGNOSIS — Z79899 Other long term (current) drug therapy: Secondary | ICD-10-CM | POA: Diagnosis not present

## 2018-08-27 DIAGNOSIS — K432 Incisional hernia without obstruction or gangrene: Secondary | ICD-10-CM | POA: Diagnosis not present

## 2018-08-27 HISTORY — DX: Incisional hernia without obstruction or gangrene: K43.2

## 2018-08-27 HISTORY — DX: Dyspnea, unspecified: R06.00

## 2018-08-27 HISTORY — DX: Diaphragmatic hernia without obstruction or gangrene: K44.9

## 2018-08-27 LAB — BASIC METABOLIC PANEL
ANION GAP: 9 (ref 5–15)
BUN: 14 mg/dL (ref 8–23)
CO2: 29 mmol/L (ref 22–32)
Calcium: 8.6 mg/dL — ABNORMAL LOW (ref 8.9–10.3)
Chloride: 104 mmol/L (ref 98–111)
Creatinine, Ser: 0.73 mg/dL (ref 0.44–1.00)
Glucose, Bld: 80 mg/dL (ref 70–99)
Potassium: 4.2 mmol/L (ref 3.5–5.1)
SODIUM: 142 mmol/L (ref 135–145)

## 2018-08-27 LAB — CBC
HCT: 45.7 % (ref 36.0–46.0)
Hemoglobin: 14.5 g/dL (ref 12.0–15.0)
MCH: 32.3 pg (ref 26.0–34.0)
MCHC: 31.7 g/dL (ref 30.0–36.0)
MCV: 101.8 fL — ABNORMAL HIGH (ref 80.0–100.0)
PLATELETS: 260 10*3/uL (ref 150–400)
RBC: 4.49 MIL/uL (ref 3.87–5.11)
RDW: 13.6 % (ref 11.5–15.5)
WBC: 3.7 10*3/uL — AB (ref 4.0–10.5)
nRBC: 0 % (ref 0.0–0.2)

## 2018-08-27 MED ORDER — CHLORHEXIDINE GLUCONATE CLOTH 2 % EX PADS
6.0000 | MEDICATED_PAD | Freq: Once | CUTANEOUS | Status: DC
  Filled 2018-08-27: qty 6

## 2018-08-27 NOTE — Patient Instructions (Signed)
Your procedure is scheduled on: 08/29/18 Thur Report to Same Day Surgery 2nd floor medical mall [facility] Entrance-take elevator on left to 2nd floor.  Check in with surgery information desk.) To find out your arrival time please call [phone] between 1PM - 3PM on 08/28/18 Wed  Remember: Instructions that are not followed completely may result in serious medical risk, up to and including death, or upon the discretion of your surgeon and anesthesiologist your surgery may need to be rescheduled.    _x___ 1. Do not eat food after midnight the night before your procedure. You may drink clear liquids up to 2 hours before you are scheduled to arrive at the hospital for your procedure.  Do not drink clear liquids within 2 hours of your scheduled arrival to the hospital.  Clear liquids include  --Water or Apple juice without pulp  --Clear carbohydrate beverage such as ClearFast or Gatorade  --Black Coffee or Clear Tea (No milk, no creamers, do not add anything to                  the coffee or Tea Type 1 and type 2 diabetics should only drink water.   ____Ensure clear carbohydrate drink on the way to the hospital for bariatric patients  ____Ensure clear carbohydrate drink 3 hours before surgery for Dr Dwyane Luo patients if physician instructed.   No gum chewing or hard candies.     __x__ 2. No Alcohol for 24 hours before or after surgery.   __x__3. No Smoking or e-cigarettes for 24 prior to surgery.  Do not use any chewable tobacco products for at least 6 hour prior to surgery   ____  4. Bring all medications with you on the day of surgery if instructed.    __x__ 5. Notify your doctor if there is any change in your medical condition     (cold, fever, infections).    x___6. On the morning of surgery brush your teeth with toothpaste and water.  You may rinse your mouth with mouth wash if you wish.  Do not swallow any toothpaste or mouthwash.   Do not wear jewelry, make-up, hairpins,  clips or nail polish.  Do not wear lotions, powders, or perfumes. You may wear deodorant.  Do not shave 48 hours prior to surgery. Men may shave face and neck.  Do not bring valuables to the hospital.    [facility] is not responsible for any belongings or valuables.               Contacts, dentures or bridgework may not be worn into surgery.  Leave your suitcase in the car. After surgery it may be brought to your room.  For patients admitted to the hospital, discharge time is determined by your                       treatment team.  _  Patients discharged the day of surgery will not be allowed to drive home.  You will need someone to drive you home and stay with you the night of your procedure.    Please read over the following fact sheets that you were given:   [facility] Preparing for Surgery and or MRSA Information   _x___ Take anti-hypertensive listed below, cardiac, seizure, asthma,     anti-reflux and psychiatric medicines. These include:  1. buPROPion (WELLBUTRIN XL) 150 MG 24 hr tablet  2.busPIRone (BUSPAR) 10 MG tablet  3.simvastatin (ZOCOR) 20 MG  tablet  4.omeprazole (PRILOSEC) 20 MG capsule  5.venlafaxine XR (EFFEXOR-XR) 75 MG 24 hr capsule  6.  ____Fleets enema or Magnesium Citrate as directed.   _x___ Use CHG Soap or sage wipes as directed on instruction sheet   ____ Use inhalers on the day of surgery and bring to hospital day of surgery  ____ Stop Metformin and Janumet 2 days prior to surgery.    ____ Take 1/2 of usual insulin dose the night before surgery and none on the morning     surgery.   _x___ Follow recommendations from Cardiologist, Pulmonologist or PCP regarding          stopping Aspirin, Coumadin, Plavix ,Eliquis, Effient, or Pradaxa, and Pletal.  X____Stop Anti-inflammatories such as Advil, Aleve, Ibuprofen, Motrin, Naproxen, Naprosyn, Goodies powders or aspirin products. OK to take Tylenol and                          Celebrex.   _x___ Stop  supplements until after surgery.  But may continue Vitamin D, Vitamin B,       and multivitamin.   ____ Bring C-Pap to the hospital.

## 2018-08-28 ENCOUNTER — Other Ambulatory Visit: Payer: Self-pay

## 2018-08-28 ENCOUNTER — Encounter: Payer: Self-pay | Admitting: Surgery

## 2018-08-28 ENCOUNTER — Ambulatory Visit (INDEPENDENT_AMBULATORY_CARE_PROVIDER_SITE_OTHER): Payer: Medicare Other | Admitting: Surgery

## 2018-08-28 VITALS — BP 142/85 | HR 82 | Temp 97.9°F | Ht <= 58 in | Wt 134.8 lb

## 2018-08-28 DIAGNOSIS — K458 Other specified abdominal hernia without obstruction or gangrene: Secondary | ICD-10-CM | POA: Diagnosis not present

## 2018-08-28 DIAGNOSIS — K432 Incisional hernia without obstruction or gangrene: Secondary | ICD-10-CM | POA: Diagnosis not present

## 2018-08-28 MED ORDER — CEFAZOLIN SODIUM-DEXTROSE 2-4 GM/100ML-% IV SOLN
2.0000 g | INTRAVENOUS | Status: AC
  Administered 2018-08-29: 2 g via INTRAVENOUS

## 2018-08-28 NOTE — Progress Notes (Signed)
08/28/2018  History of Present Illness: Pamela Ball is a 72 y.o. female with an incisional ventral hernia, scheduled for laparoscopic repair tomorrow.  She presents for H&P update.  She denies any new symptoms and denies any worsening pain.  She is tolerating a diet without nausea or vomiting and having normal bowel function.  Past Medical History: Past Medical History:  Diagnosis Date  . Abdominal hernia with obstruction and without gangrene   . Anxiety 02/15/2017  . Bipolar 1 disorder, depressed, full remission ([place]) 02/15/2017  . Bipolar affective disorder ([place])   . COPD (chronic obstructive pulmonary disease) ([place]) 02/15/2017  . Depression   . Dyspnea   . GERD (gastroesophageal reflux disease) 02/15/2017  . Glaucoma   . Hiatal hernia   . History of abdominal hernia 05/21/2017  . History of compression fracture of spine 02/15/2017  . Hyperlipidemia   . Incisional hernia   . Incisional ventral hernia w obstruction 06/02/2017  . Normocytic anemia 05/23/2017  . 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 Presbycusis of both ears 02/15/2017     Past Surgical History: Past Surgical History:  Procedure Laterality Date  . ABDOMINAL HYSTERECTOMY  11/06/2006  . CESAREAN SECTION     x3  . COLONOSCOPY WITH PROPOFOL N/A 06/25/2018   Procedure: COLONOSCOPY WITH PROPOFOL;  Surgeon: Jonathon Bellows, MD;  Location: [facility] ENDOSCOPY;  Service: Endoscopy;  Laterality: N/A;  . KYPHOPLASTY    . VENTRAL HERNIA REPAIR N/A 06/02/2017   Procedure: exploratory laparotomy repair of incarcerated  VENTRAL hernia;  Surgeon: Florene Glen, MD;  Location: [facility] ORS;  Service: General;  Laterality: N/A;    Home Medications: Prior to Admission medications   Medication Sig Start Date End Date Taking? Authorizing Provider  buPROPion (WELLBUTRIN XL) 150 MG 24 hr tablet Take 150 mg by mouth daily.  01/11/15  Yes [provider]  busPIRone (BUSPAR) 10 MG tablet Take 1 tablet (10 mg total) by mouth  daily. Prescribed by Psychiatry Dr Kasandra Knudsen 11/14/17  Yes Karamalegos, Devonne Doughty, DO  cholecalciferol (VITAMIN D) 1000 units tablet Take 1,000 Units by mouth daily.   Yes [provider]  Multiple Vitamin (MULTI-VITAMINS) TABS Take 1 tablet by mouth daily.    Yes [provider]  OLANZapine (ZYPREXA) 5 MG tablet Take 5 mg by mouth at bedtime.  01/11/15  Yes [provider]  omeprazole (PRILOSEC) 20 MG capsule Take 1 capsule (20 mg total) by mouth daily before breakfast. 05/14/18  Yes Karamalegos, Devonne Doughty, DO  simvastatin (ZOCOR) 20 MG tablet Take 1 tablet (20 mg total) by mouth daily. 05/14/18  Yes Karamalegos, Devonne Doughty, DO  venlafaxine XR (EFFEXOR-XR) 75 MG 24 hr capsule Take 75 mg by mouth daily with breakfast.  01/11/15  Yes [provider]  vitamin A 10000 UNIT capsule Take 10,000 Units by mouth daily.   Yes [provider]  vitamin C (ASCORBIC ACID) 500 MG tablet Take 500 mg by mouth daily.   Yes [provider]    Allergies: No Known Allergies  Review of Systems: Review of Systems  Constitutional: Negative for chills and fever.  Respiratory: Negative for shortness of breath.   Cardiovascular: Negative for chest pain.  Gastrointestinal: Negative for abdominal pain, nausea and vomiting.    Physical Exam BP (!) 142/85   Pulse 82   Temp 97.9 F (36.6 C) (Temporal)   Ht 4\' 10"  (1.473 m)   Wt 134 lb 12.8 oz (61.1 kg)   BMI 28.17 kg/m  CONSTITUTIONAL:  No acute distress HEENT:  Normocephalic, atraumatic, extraocular motion intact. RESPIRATORY:  Lungs are clear, and breath sounds are equal bilaterally. Normal respiratory effort without pathologic use of accessory muscles. CARDIOVASCULAR: Heart is regular without murmurs, gallops, or rubs. GI: The abdomen is soft, nondistended, nontender to palpation.  Patient has a midline incisional hernia, with contents protruding towards the left of midline. There were no palpable masses. There was no  hepatosplenomegaly. MSK:  Patient also has a right lumbar hernia with fat content only.  Nontender to palpation. NEUROLOGIC:  Motor and sensation is grossly normal.  Cranial nerves are grossly intact. PSYCH:  Alert and oriented to person, place and time. Affect is normal.  Labs/Imaging: Preop Labs 08/27/18: Na 142, K 4.2, Cl 104, CO2 29, BUN 14, Cr 0.73, Gluc 80.  WBC 3.7, Hgb 14.5, Hct 45.7, Plt 260  Assessment and Plan: This is a 72 y.o. female with incisional ventral hernia.  Patient to go to OR tomorrow for laparoscopic incisional hernia repair with mesh.  Discussed the surgery at length, including risks of bleeding, infection, injury to surrounding structures, potential for open procedure, and she's willing to proceed.  Will try to do this on outpatient basis but depending on how the surgery goes and her post-op pain, we may keep her overnight.  She understands this and is willing to proceed.  She'll be NPO after midnight.  Face-to-face time spent with the patient and care providers was 25 minutes, with more than 50% of the time spent counseling, educating, and coordinating care of the patient.     Melvyn Neth, [facility]

## 2018-08-28 NOTE — Patient Instructions (Signed)
Please call if you have questions or concerns.  

## 2018-08-28 NOTE — H&P (View-Only) (Signed)
08/28/2018  History of Present Illness: Pamela Ball is a 72 y.o. female with an incisional ventral hernia, scheduled for laparoscopic repair tomorrow.  She presents for H&P update.  She denies any new symptoms and denies any worsening pain.  She is tolerating a diet without nausea or vomiting and having normal bowel function.  Past Medical History: Past Medical History:  Diagnosis Date  . Abdominal hernia with obstruction and without gangrene   . Anxiety 02/15/2017  . Bipolar 1 disorder, depressed, full remission ([place]) 02/15/2017  . Bipolar affective disorder ([place])   . COPD (chronic obstructive pulmonary disease) ([place]) 02/15/2017  . Depression   . Dyspnea   . GERD (gastroesophageal reflux disease) 02/15/2017  . Glaucoma   . Hiatal hernia   . History of abdominal hernia 05/21/2017  . History of compression fracture of spine 02/15/2017  . Hyperlipidemia   . Incisional hernia   . Incisional ventral hernia w obstruction 06/02/2017  . Normocytic anemia 05/23/2017  . 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 Presbycusis of both ears 02/15/2017     Past Surgical History: Past Surgical History:  Procedure Laterality Date  . ABDOMINAL HYSTERECTOMY  11/06/2006  . CESAREAN SECTION     x3  . COLONOSCOPY WITH PROPOFOL N/A 06/25/2018   Procedure: COLONOSCOPY WITH PROPOFOL;  Surgeon: Jonathon Bellows, MD;  Location: [facility] ENDOSCOPY;  Service: Endoscopy;  Laterality: N/A;  . KYPHOPLASTY    . VENTRAL HERNIA REPAIR N/A 06/02/2017   Procedure: exploratory laparotomy repair of incarcerated  VENTRAL hernia;  Surgeon: Florene Glen, MD;  Location: [facility] ORS;  Service: General;  Laterality: N/A;    Home Medications: Prior to Admission medications   Medication Sig Start Date End Date Taking? Authorizing Provider  buPROPion (WELLBUTRIN XL) 150 MG 24 hr tablet Take 150 mg by mouth daily.  01/11/15  Yes [provider]  busPIRone (BUSPAR) 10 MG tablet Take 1 tablet (10 mg total) by mouth  daily. Prescribed by Psychiatry Dr Kasandra Knudsen 11/14/17  Yes Karamalegos, Devonne Doughty, DO  cholecalciferol (VITAMIN D) 1000 units tablet Take 1,000 Units by mouth daily.   Yes [provider]  Multiple Vitamin (MULTI-VITAMINS) TABS Take 1 tablet by mouth daily.    Yes [provider]  OLANZapine (ZYPREXA) 5 MG tablet Take 5 mg by mouth at bedtime.  01/11/15  Yes [provider]  omeprazole (PRILOSEC) 20 MG capsule Take 1 capsule (20 mg total) by mouth daily before breakfast. 05/14/18  Yes Karamalegos, Devonne Doughty, DO  simvastatin (ZOCOR) 20 MG tablet Take 1 tablet (20 mg total) by mouth daily. 05/14/18  Yes Karamalegos, Devonne Doughty, DO  venlafaxine XR (EFFEXOR-XR) 75 MG 24 hr capsule Take 75 mg by mouth daily with breakfast.  01/11/15  Yes [provider]  vitamin A 10000 UNIT capsule Take 10,000 Units by mouth daily.   Yes [provider]  vitamin C (ASCORBIC ACID) 500 MG tablet Take 500 mg by mouth daily.   Yes [provider]    Allergies: No Known Allergies  Review of Systems: Review of Systems  Constitutional: Negative for chills and fever.  Respiratory: Negative for shortness of breath.   Cardiovascular: Negative for chest pain.  Gastrointestinal: Negative for abdominal pain, nausea and vomiting.    Physical Exam BP (!) 142/85   Pulse 82   Temp 97.9 F (36.6 C) (Temporal)   Ht 4\' 10"  (1.473 m)   Wt 134 lb 12.8 oz (61.1 kg)   BMI 28.17 kg/m  CONSTITUTIONAL:  No acute distress HEENT:  Normocephalic, atraumatic, extraocular motion intact. RESPIRATORY:  Lungs are clear, and breath sounds are equal bilaterally. Normal respiratory effort without pathologic use of accessory muscles. CARDIOVASCULAR: Heart is regular without murmurs, gallops, or rubs. GI: The abdomen is soft, nondistended, nontender to palpation.  Patient has a midline incisional hernia, with contents protruding towards the left of midline. There were no palpable masses. There was no  hepatosplenomegaly. MSK:  Patient also has a right lumbar hernia with fat content only.  Nontender to palpation. NEUROLOGIC:  Motor and sensation is grossly normal.  Cranial nerves are grossly intact. PSYCH:  Alert and oriented to person, place and time. Affect is normal.  Labs/Imaging: Preop Labs 08/27/18: Na 142, K 4.2, Cl 104, CO2 29, BUN 14, Cr 0.73, Gluc 80.  WBC 3.7, Hgb 14.5, Hct 45.7, Plt 260  Assessment and Plan: This is a 72 y.o. female with incisional ventral hernia.  Patient to go to OR tomorrow for laparoscopic incisional hernia repair with mesh.  Discussed the surgery at length, including risks of bleeding, infection, injury to surrounding structures, potential for open procedure, and she's willing to proceed.  Will try to do this on outpatient basis but depending on how the surgery goes and her post-op pain, we may keep her overnight.  She understands this and is willing to proceed.  She'll be NPO after midnight.  Face-to-face time spent with the patient and care providers was 25 minutes, with more than 50% of the time spent counseling, educating, and coordinating care of the patient.     Melvyn Neth, [facility]

## 2018-08-29 ENCOUNTER — Ambulatory Visit: Payer: Medicare Other | Admitting: Anesthesiology

## 2018-08-29 ENCOUNTER — Other Ambulatory Visit: Payer: Self-pay

## 2018-08-29 ENCOUNTER — Ambulatory Visit
Admission: RE | Admit: 2018-08-29 | Discharge: 2018-08-29 | Disposition: A | Discharge status: Home / Self Care | Payer: Medicare Other | Source: Ambulatory Visit | Attending: Surgery | Admitting: Surgery

## 2018-08-29 ENCOUNTER — Encounter
Admission: RE | Disposition: A | Discharge status: Home / Self Care | Payer: Self-pay | Source: Ambulatory Visit | Attending: Surgery

## 2018-08-29 DIAGNOSIS — E785 Hyperlipidemia, unspecified: Secondary | ICD-10-CM | POA: Insufficient documentation

## 2018-08-29 DIAGNOSIS — K219 Gastro-esophageal reflux disease without esophagitis: Secondary | ICD-10-CM | POA: Diagnosis not present

## 2018-08-29 DIAGNOSIS — M170 Bilateral primary osteoarthritis of knee: Secondary | ICD-10-CM | POA: Insufficient documentation

## 2018-08-29 DIAGNOSIS — F319 Bipolar disorder, unspecified: Secondary | ICD-10-CM | POA: Insufficient documentation

## 2018-08-29 DIAGNOSIS — J449 Chronic obstructive pulmonary disease, unspecified: Secondary | ICD-10-CM | POA: Insufficient documentation

## 2018-08-29 DIAGNOSIS — Z79899 Other long term (current) drug therapy: Secondary | ICD-10-CM | POA: Insufficient documentation

## 2018-08-29 DIAGNOSIS — K432 Incisional hernia without obstruction or gangrene: Secondary | ICD-10-CM | POA: Diagnosis not present

## 2018-08-29 DIAGNOSIS — F419 Anxiety disorder, unspecified: Secondary | ICD-10-CM | POA: Insufficient documentation

## 2018-08-29 HISTORY — PX: VENTRAL HERNIA REPAIR: SHX424

## 2018-08-29 SURGERY — REPAIR, HERNIA, VENTRAL, LAPAROSCOPIC
Anesthesia: General | Site: Abdomen

## 2018-08-29 MED ORDER — ACETAMINOPHEN 500 MG PO TABS
ORAL_TABLET | ORAL | Status: AC
  Administered 2018-08-29: 1000 mg via ORAL
  Filled 2018-08-29: qty 2

## 2018-08-29 MED ORDER — FENTANYL CITRATE (PF) 100 MCG/2ML IJ SOLN
INTRAMUSCULAR | Status: AC
  Filled 2018-08-29: qty 4

## 2018-08-29 MED ORDER — OXYCODONE HCL 5 MG PO TABS: 5.0000 mg | ORAL_TABLET | ORAL | 0 refills | Status: DC | PRN

## 2018-08-29 MED ORDER — MIDAZOLAM HCL 2 MG/2ML IJ SOLN
INTRAMUSCULAR | Status: DC | PRN
  Administered 2018-08-29: 2 mg via INTRAVENOUS

## 2018-08-29 MED ORDER — SUCCINYLCHOLINE CHLORIDE 20 MG/ML IJ SOLN
INTRAMUSCULAR | Status: DC | PRN
  Administered 2018-08-29: 100 mg via INTRAVENOUS

## 2018-08-29 MED ORDER — DEXAMETHASONE SODIUM PHOSPHATE 10 MG/ML IJ SOLN
INTRAMUSCULAR | Status: DC | PRN
  Administered 2018-08-29: 10 mg via INTRAVENOUS

## 2018-08-29 MED ORDER — ROCURONIUM BROMIDE 100 MG/10ML IV SOLN
INTRAVENOUS | Status: DC | PRN
  Administered 2018-08-29: 20 mg via INTRAVENOUS

## 2018-08-29 MED ORDER — GABAPENTIN 300 MG PO CAPS
300.0000 mg | ORAL_CAPSULE | ORAL | Status: AC
  Administered 2018-08-29: 300 mg via ORAL

## 2018-08-29 MED ORDER — GABAPENTIN 300 MG PO CAPS
ORAL_CAPSULE | ORAL | Status: AC
  Administered 2018-08-29: 300 mg via ORAL
  Filled 2018-08-29: qty 1

## 2018-08-29 MED ORDER — LIDOCAINE HCL (CARDIAC) PF 100 MG/5ML IV SOSY
PREFILLED_SYRINGE | INTRAVENOUS | Status: DC | PRN
  Administered 2018-08-29: 100 mg via INTRAVENOUS

## 2018-08-29 MED ORDER — LACTATED RINGERS IV SOLN
INTRAVENOUS | Status: DC
  Administered 2018-08-29: 10:00:00 via INTRAVENOUS

## 2018-08-29 MED ORDER — PHENYLEPHRINE HCL 10 MG/ML IJ SOLN
INTRAMUSCULAR | Status: DC | PRN
  Administered 2018-08-29 (×3): 200 ug via INTRAVENOUS

## 2018-08-29 MED ORDER — BUPIVACAINE-EPINEPHRINE 0.5% -1:200000 IJ SOLN
INTRAMUSCULAR | Status: DC | PRN
  Administered 2018-08-29: 30 mL

## 2018-08-29 MED ORDER — ACETAMINOPHEN 500 MG PO TABS
1000.0000 mg | ORAL_TABLET | ORAL | Status: AC
  Administered 2018-08-29: 1000 mg via ORAL

## 2018-08-29 MED ORDER — EPHEDRINE SULFATE 50 MG/ML IJ SOLN
INTRAMUSCULAR | Status: DC | PRN
  Administered 2018-08-29: 10 mg via INTRAVENOUS

## 2018-08-29 MED ORDER — FENTANYL CITRATE (PF) 100 MCG/2ML IJ SOLN
INTRAMUSCULAR | Status: DC | PRN
  Administered 2018-08-29 (×2): 50 ug via INTRAVENOUS

## 2018-08-29 MED ORDER — BUPIVACAINE LIPOSOME 1.3 % IJ SUSP
INTRAMUSCULAR | Status: DC | PRN
  Administered 2018-08-29: 20 mL

## 2018-08-29 MED ORDER — IBUPROFEN 600 MG PO TABS: 600.0000 mg | ORAL_TABLET | Freq: Three times a day (TID) | ORAL | 0 refills | Status: DC | PRN

## 2018-08-29 MED ORDER — OXYCODONE HCL 5 MG/5ML PO SOLN: 5.0000 mg | Freq: Once | ORAL | Status: DC | PRN

## 2018-08-29 MED ORDER — SODIUM CHLORIDE FLUSH 0.9 % IV SOLN
INTRAVENOUS | Status: AC
  Filled 2018-08-29: qty 10

## 2018-08-29 MED ORDER — ONDANSETRON HCL 4 MG/2ML IJ SOLN
INTRAMUSCULAR | Status: DC | PRN
  Administered 2018-08-29: 4 mg via INTRAVENOUS

## 2018-08-29 MED ORDER — BUPIVACAINE-EPINEPHRINE (PF) 0.5% -1:200000 IJ SOLN
INTRAMUSCULAR | Status: AC
  Filled 2018-08-29: qty 30

## 2018-08-29 MED ORDER — PROPOFOL 10 MG/ML IV BOLUS
INTRAVENOUS | Status: AC
  Filled 2018-08-29: qty 20

## 2018-08-29 MED ORDER — SODIUM CHLORIDE 0.9 % IV SOLN
INTRAVENOUS | Status: DC | PRN
  Administered 2018-08-29: 25 ug/min via INTRAVENOUS

## 2018-08-29 MED ORDER — FENTANYL CITRATE (PF) 100 MCG/2ML IJ SOLN: 25.0000 ug | INTRAMUSCULAR | Status: DC | PRN

## 2018-08-29 MED ORDER — BUPIVACAINE LIPOSOME 1.3 % IJ SUSP
INTRAMUSCULAR | Status: AC
  Filled 2018-08-29: qty 20

## 2018-08-29 MED ORDER — PROPOFOL 10 MG/ML IV BOLUS
INTRAVENOUS | Status: DC | PRN
  Administered 2018-08-29: 100 mg via INTRAVENOUS

## 2018-08-29 MED ORDER — SUGAMMADEX SODIUM 200 MG/2ML IV SOLN
INTRAVENOUS | Status: DC | PRN
  Administered 2018-08-29: 200 mg via INTRAVENOUS

## 2018-08-29 MED ORDER — SODIUM CHLORIDE 0.9 % IJ SOLN
INTRAMUSCULAR | Status: DC | PRN
  Administered 2018-08-29: 10 mL

## 2018-08-29 MED ORDER — MIDAZOLAM HCL 2 MG/2ML IJ SOLN
INTRAMUSCULAR | Status: AC
  Filled 2018-08-29: qty 2

## 2018-08-29 MED ORDER — CEFAZOLIN SODIUM-DEXTROSE 2-4 GM/100ML-% IV SOLN
INTRAVENOUS | Status: AC
  Filled 2018-08-29: qty 100

## 2018-08-29 MED ORDER — OXYCODONE HCL 5 MG PO TABS: 5.0000 mg | ORAL_TABLET | Freq: Once | ORAL | Status: DC | PRN

## 2018-08-29 SURGICAL SUPPLY — 44 items
BLADE SURG SZ11 CARB STEEL (BLADE) ×2 IMPLANT
CANISTER SUCT 1200ML W/VALVE (MISCELLANEOUS) ×2 IMPLANT
CHLORAPREP W/TINT 26ML (MISCELLANEOUS) ×2 IMPLANT
COVER WAND RF STERILE (DRAPES) ×2 IMPLANT
DERMABOND ADVANCED (GAUZE/BANDAGES/DRESSINGS) ×1
DERMABOND ADVANCED .7 DNX12 (GAUZE/BANDAGES/DRESSINGS) ×1 IMPLANT
ELECT REM PT RETURN 9FT ADLT (ELECTROSURGICAL) ×2
ELECTRODE REM PT RTRN 9FT ADLT (ELECTROSURGICAL) ×1 IMPLANT
GLOVE SURG SYN 7.0 (GLOVE) ×2 IMPLANT
GLOVE SURG SYN 7.5  E (GLOVE) ×1
GLOVE SURG SYN 7.5 E (GLOVE) ×1 IMPLANT
GOWN STRL REUS W/ TWL LRG LVL3 (GOWN DISPOSABLE) ×2 IMPLANT
GOWN STRL REUS W/TWL LRG LVL3 (GOWN DISPOSABLE) ×2
GRASPER SUT TROCAR 14GX15 (MISCELLANEOUS) ×2 IMPLANT
IRRIGATION STRYKERFLOW (MISCELLANEOUS) ×1 IMPLANT
IRRIGATOR STRYKERFLOW (MISCELLANEOUS) ×2
IV NS 1000ML (IV SOLUTION) ×1
IV NS 1000ML BAXH (IV SOLUTION) ×1 IMPLANT
KIT TURNOVER KIT A (KITS) ×2 IMPLANT
L-HOOK LAP DISP 36CM (ELECTROSURGICAL) ×2
LABEL OR SOLS (LABEL) ×2 IMPLANT
LHOOK LAP DISP 36CM (ELECTROSURGICAL) ×1 IMPLANT
LIGASURE LAP MARYLAND 5MM 37CM (ELECTROSURGICAL) IMPLANT
MESH VENT LT ST 11.4CM CRL (Mesh General) ×2 IMPLANT
NEEDLE HYPO 22GX1.5 SAFETY (NEEDLE) ×2 IMPLANT
NEEDLE SPNL 22GX5 LNG QUINC BK (NEEDLE) ×2 IMPLANT
NEEDLE VERESS 14GA 120MM (NEEDLE) ×2 IMPLANT
PACK LAP CHOLECYSTECTOMY (MISCELLANEOUS) ×2 IMPLANT
PENCIL ELECTRO HAND CTR (MISCELLANEOUS) ×2 IMPLANT
SCISSORS METZENBAUM CVD 33 (INSTRUMENTS) ×2 IMPLANT
SLEEVE ADV FIXATION 5X100MM (TROCAR) ×6 IMPLANT
SUT MNCRL 4-0 (SUTURE) ×1
SUT MNCRL 4-0 27XMFL (SUTURE) ×1
SUT PROLENE 0 CT 1 30 (SUTURE) ×4 IMPLANT
SUT VIC AB 3-0 SH 27 (SUTURE) ×1
SUT VIC AB 3-0 SH 27X BRD (SUTURE) ×1 IMPLANT
SUT VICRYL 0 TIES 12 18 (SUTURE) ×2 IMPLANT
SUTURE MNCRL 4-0 27XMF (SUTURE) ×1 IMPLANT
TACKER 5MM HERNIA 3.5CML NAB (ENDOMECHANICALS) ×2 IMPLANT
TRAY FOLEY MTR SLVR 16FR STAT (SET/KITS/TRAYS/PACK) ×2 IMPLANT
TROCAR BALLN GELPORT 12X130M (ENDOMECHANICALS) ×2 IMPLANT
TROCAR Z-THREAD FIOS 5X100MM (TROCAR) ×2 IMPLANT
TROCAR Z-THREAD OPTICAL 5X100M (TROCAR) ×2 IMPLANT
TUBING INSUFFLATION (TUBING) ×2 IMPLANT

## 2018-08-29 NOTE — Op Note (Signed)
  Procedure Date:  08/29/2018  Pre-operative Diagnosis:  Reducible incisional ventral hernia  Post-operative Diagnosis: Redubile incisional ventral hernia  Procedure:  Laparoscopic Incisional Hernia Repair with mesh  Surgeon:  Melvyn Neth, MD  Anesthesia:  General endotracheal  Estimated Blood Loss:  10 ml  Specimens:  None  Complications:  None  Indications for Procedure:  This is a 72 y.o. female who presents with an incisional ventral hernia.  The options of surgery versus observation were reviewed with the patient and/or family. The risks of bleeding, abscess or infection, recurrence of symptoms, potential for an open procedure, injury to surrounding structures, and chronic pain were all discussed with the patient and was willing to proceed.  Description of Procedure: The patient was correctly identified in the preoperative area and brought into the operating room.  The patient was placed supine with VTE prophylaxis in place.  Appropriate time-outs were performed.  Anesthesia was induced and the patient was intubated.  Appropriate antibiotics were infused.  Foley catheter was placed.  The abdomen was prepped and draped in a sterile fashion. The patient's hernia defect was marked with a marking pen.  A Veress needle was introduced in the right upper quadrant and pneumoperitoneum was obtained with appropriate pressures.  2 5-mm and one 12-mm port were placed along the right lateral side without complications.  There were significant adhesions of the omentum and bowel to the abdominal wall, and there were taken down sharply and with cautery.  The hernia defect contained some omentum and this was reduced with cautery.  The defect measured about 4.5 cm and a circular 4.5 inch Bard Ventralight ST Echo mesh was placed via the 12-mm port.  The positioning system was insufflated and the mesh was placed over the defect covering it entirely.  A left lateral 5 mm port was placed in order to  facilitate tack placement, and the mesh was tacked in place.  The mesh was further secured with 4 transfascial prolene sutures.  The 12-mm port was removed and the fascia was closed under direct visualization utilizing an Endo Close technique with 0 Vicryl suture.  The 5 mm ports were removed.  Exparel local anesthetic was infused in all incisions and the incisions were closed with 4-0 Monocryl.  The wounds were cleaned and sealed with DermaBond.  Foley catheter was removed.  The patient was emerged from anesthesia and extubated and brought to the recovery room for further management.  The patient tolerated the procedure well and all counts were correct at the end of the case.   Melvyn Neth, MD

## 2018-08-29 NOTE — Transfer of Care (Signed)
Immediate Anesthesia Transfer of Care Note  Patient: Pamela Ball  Procedure(s) Performed: LAPAROSCOPIC INCISIONAL HERNIA (N/A Abdomen)  Patient Location: PACU  Anesthesia Type:General  Level of Consciousness: awake and oriented  Airway & Oxygen Therapy: Patient Spontanous Breathing and Patient connected to face mask oxygen  Post-op Assessment: Report given to RN and Post -op Vital signs reviewed and stable  Post vital signs: Reviewed and stable  Last Vitals:  Vitals Value Taken Time  BP    Temp    Pulse 93 08/29/2018  2:54 PM  Resp 16 08/29/2018  2:54 PM  SpO2 97 % 08/29/2018  2:54 PM  Vitals shown include unvalidated device data.  Last Pain:  Vitals:   08/29/18 0959  TempSrc: Tympanic  PainSc: 0-No pain         Complications: No apparent anesthesia complications

## 2018-08-29 NOTE — Anesthesia Procedure Notes (Signed)
Procedure Name: Intubation Date/Time: 08/29/2018 12:38 PM Performed by: Philbert Riser, CRNA Pre-anesthesia Checklist: Patient identified, Emergency Drugs available, Suction available, Patient being monitored and Timeout performed Patient Re-evaluated:Patient Re-evaluated prior to induction Oxygen Delivery Method: Circle system utilized and Simple face mask Induction Type: IV induction Ventilation: Mask ventilation without difficulty Laryngoscope Size: McGraph and 3 Grade View: Grade I Tube type: Oral Tube size: 7.0 mm Number of attempts: 1 Airway Equipment and Method: Stylet Placement Confirmation: ETT inserted through vocal cords under direct vision,  positive ETCO2 and breath sounds checked- equal and bilateral Secured at: 20 cm Tube secured with: Tape Dental Injury: Teeth and Oropharynx as per pre-operative assessment

## 2018-08-29 NOTE — Anesthesia Post-op Follow-up Note (Signed)
Anesthesia QCDR form completed.        

## 2018-08-29 NOTE — Anesthesia Preprocedure Evaluation (Signed)
Anesthesia Evaluation  Patient identified by MRN, date of birth, ID band Patient awake    Reviewed: Allergy & Precautions, H&P , NPO status , Patient's Chart, lab work & pertinent test results  History of Anesthesia Complications Negative for: history of anesthetic complications  Airway Mallampati: III  TM Distance: <3 FB Neck ROM: limited    Dental  (+) Chipped, Poor Dentition, Missing, Partial Upper   Pulmonary shortness of breath and with exertion, COPD,           Cardiovascular Exercise Tolerance: Good (-) angina(-) Past MI and (-) DOE negative cardio ROS       Neuro/Psych PSYCHIATRIC DISORDERS negative neurological ROS     GI/Hepatic Neg liver ROS, hiatal hernia, GERD  Medicated and Controlled,  Endo/Other  negative endocrine ROS  Renal/GU      Musculoskeletal  (+) Arthritis ,   Abdominal   Peds  Hematology negative hematology ROS (+)   Anesthesia Other Findings Past Medical History: No date: Abdominal hernia with obstruction and without gangrene 02/15/2017: Anxiety 02/15/2017: Bipolar 1 disorder, depressed, full remission ([place]) No date: Bipolar affective disorder ([place]) 02/15/2017: COPD (chronic obstructive pulmonary disease) ([place]) No date: Depression No date: Dyspnea 02/15/2017: GERD (gastroesophageal reflux disease) No date: Glaucoma No date: Hiatal hernia 05/21/2017: History of abdominal hernia 02/15/2017: History of compression fracture of spine No date: Hyperlipidemia No date: Incisional hernia 06/02/2017: Incisional ventral hernia w obstruction 05/23/2017: Normocytic anemia 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, 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  Past Surgical History: 11/06/2006: ABDOMINAL HYSTERECTOMY No date: CESAREAN SECTION     Comment:  x3 06/25/2018: COLONOSCOPY WITH PROPOFOL; N/A     Comment:  Procedure: COLONOSCOPY WITH PROPOFOL;  Surgeon: Jonathon Bellows, MD;   Location: [facility] ENDOSCOPY;  Service:               Endoscopy;  Laterality: N/A; No date: KYPHOPLASTY 06/02/2017: VENTRAL HERNIA REPAIR; N/A     Comment:  Procedure: exploratory laparotomy repair of incarcerated              VENTRAL hernia;  Surgeon: Florene Glen, MD;                Location: [facility] ORS;  Service: General;  Laterality: N/A;     Reproductive/Obstetrics negative OB ROS                             Anesthesia Physical Anesthesia Plan  ASA: III  Anesthesia Plan: General ETT   Post-op Pain Management:    Induction: Intravenous  PONV Risk Score and Plan: Ondansetron, Dexamethasone, Midazolam and Treatment may vary due to age or medical condition  Airway Management Planned: Oral ETT and Video Laryngoscope Planned  Additional Equipment:   Intra-op Plan:   Post-operative Plan: Extubation in OR  Informed Consent: I have reviewed the patients History and Physical, chart, labs and discussed the procedure including the risks, benefits and alternatives for the proposed anesthesia with the patient or authorized representative who has indicated his/her understanding and acceptance.   Dental Advisory Given  Plan Discussed with: Anesthesiologist, CRNA and Surgeon  Anesthesia Plan Comments: (Patient consented for risks of anesthesia including but not limited to:  - adverse reactions to medications - damage to teeth, lips or other oral mucosa - sore throat or hoarseness - Damage to heart, brain, lungs or loss of life  Patient voiced  understanding.)        Anesthesia Quick Evaluation

## 2018-08-29 NOTE — Interval H&P Note (Signed)
History and Physical Interval Note:  08/29/2018 10:39 AM  Pamela Ball  has presented today for surgery, with the diagnosis of INCISIONAL VENTRAL HERNIA  The various methods of treatment have been discussed with the patient and family. After consideration of risks, benefits and other options for treatment, the patient has consented to  Procedure(s): LAPAROSCOPIC INCISIONAL HERNIA (N/A) as a surgical intervention .  The patient's history has been reviewed, patient examined, no change in status, stable for surgery.  I have reviewed the patient's chart and labs.  Questions were answered to the patient's satisfaction.     [name]

## 2018-08-30 ENCOUNTER — Encounter: Payer: Self-pay | Admitting: Surgery

## 2018-08-30 NOTE — Anesthesia Postprocedure Evaluation (Signed)
Anesthesia Post Note  Patient: Pamela Ball  Procedure(s) Performed: LAPAROSCOPIC INCISIONAL HERNIA (N/A Abdomen)  Patient location during evaluation: PACU Anesthesia Type: General Level of consciousness: awake and alert Pain management: pain level controlled Vital Signs Assessment: post-procedure vital signs reviewed and stable Respiratory status: spontaneous breathing, nonlabored ventilation, respiratory function stable and patient connected to nasal cannula oxygen Cardiovascular status: blood pressure returned to baseline and stable Postop Assessment: no apparent nausea or vomiting Anesthetic complications: no     Last Vitals:  Vitals:   08/29/18 1554 08/29/18 1634  BP: (!) 148/67 (!) 124/56  Pulse: 95 95  Resp: 16   Temp: (!) 36.3 C   SpO2: 95% 94%    Last Pain:  Vitals:   08/29/18 1634  TempSrc:   PainSc: 0-No pain                 Durenda Hurt

## 2018-09-13 ENCOUNTER — Encounter: Payer: Self-pay | Admitting: Surgery

## 2018-09-13 ENCOUNTER — Ambulatory Visit (INDEPENDENT_AMBULATORY_CARE_PROVIDER_SITE_OTHER): Payer: Medicare Other | Admitting: Surgery

## 2018-09-13 ENCOUNTER — Other Ambulatory Visit: Payer: Self-pay

## 2018-09-13 VITALS — BP 139/82 | HR 87 | Temp 98.1°F | Ht 59.0 in | Wt 133.0 lb

## 2018-09-13 DIAGNOSIS — K432 Incisional hernia without obstruction or gangrene: Secondary | ICD-10-CM

## 2018-09-13 DIAGNOSIS — Z09 Encounter for follow-up examination after completed treatment for conditions other than malignant neoplasm: Secondary | ICD-10-CM

## 2018-09-13 NOTE — Progress Notes (Signed)
09/13/2018  HPI: Pamela Ball is a 72 y.o. female s/p laparoscopic incisional hernia repair with mesh on 10/24.  She presents today for follow up.  She reports doing great without any pain or discomfort.  She's tolerating a diet and having bowel movements.  Denies any new bulging.  Vital signs: BP 139/82   Pulse 87   Temp 98.1 F (36.7 C) (Skin)   Ht 4\' 11"  (1.499 m)   Wt 133 lb (60.3 kg)   BMI 26.86 kg/m    Physical Exam: Constitutional: No acute distress Abdomen:  Soft, non-distended, non-tender to palpation.  Incisions are clean, dry, intact with no evidence of infection.  No hernia recurrence.  Assessment/Plan: This is a 72 y.o. female s/p laparoscopic incisional hernia repair with mesh.  --Reminded patient that she still has two weeks of no heavy lifting or pushing of no more than 10-15 lbs.   --Patient still has a right lumbar hernia and she would like to wait until next year to pursue any surgical management.  We will schedule her for another follow up in January to discuss surgery and options.   Melvyn Neth, [facility]

## 2018-09-13 NOTE — Patient Instructions (Addendum)
Return in two months.The patient is aware to call back for any questions or concerns. 

## 2018-09-19 ENCOUNTER — Other Ambulatory Visit: Payer: Self-pay

## 2018-09-19 ENCOUNTER — Emergency Department
Admission: EM | Admit: 2018-09-19 | Discharge: 2018-09-19 | Disposition: A | Discharge status: Home / Self Care | Payer: Medicare Other | Attending: Emergency Medicine | Admitting: Emergency Medicine

## 2018-09-19 ENCOUNTER — Emergency Department: Payer: Medicare Other

## 2018-09-19 ENCOUNTER — Encounter: Payer: Self-pay | Admitting: Emergency Medicine

## 2018-09-19 DIAGNOSIS — Z79899 Other long term (current) drug therapy: Secondary | ICD-10-CM | POA: Insufficient documentation

## 2018-09-19 DIAGNOSIS — S299XXA Unspecified injury of thorax, initial encounter: Secondary | ICD-10-CM | POA: Diagnosis present

## 2018-09-19 DIAGNOSIS — M549 Dorsalgia, unspecified: Secondary | ICD-10-CM | POA: Diagnosis not present

## 2018-09-19 DIAGNOSIS — J449 Chronic obstructive pulmonary disease, unspecified: Secondary | ICD-10-CM | POA: Insufficient documentation

## 2018-09-19 DIAGNOSIS — M545 Low back pain, unspecified: Secondary | ICD-10-CM

## 2018-09-19 DIAGNOSIS — S22000A Wedge compression fracture of unspecified thoracic vertebra, initial encounter for closed fracture: Secondary | ICD-10-CM

## 2018-09-19 DIAGNOSIS — Y998 Other external cause status: Secondary | ICD-10-CM | POA: Diagnosis not present

## 2018-09-19 DIAGNOSIS — Y929 Unspecified place or not applicable: Secondary | ICD-10-CM | POA: Diagnosis not present

## 2018-09-19 DIAGNOSIS — S22070A Wedge compression fracture of T9-T10 vertebra, initial encounter for closed fracture: Secondary | ICD-10-CM | POA: Insufficient documentation

## 2018-09-19 DIAGNOSIS — F319 Bipolar disorder, unspecified: Secondary | ICD-10-CM | POA: Diagnosis not present

## 2018-09-19 DIAGNOSIS — W010XXA Fall on same level from slipping, tripping and stumbling without subsequent striking against object, initial encounter: Secondary | ICD-10-CM | POA: Insufficient documentation

## 2018-09-19 DIAGNOSIS — S3992XA Unspecified injury of lower back, initial encounter: Secondary | ICD-10-CM | POA: Diagnosis not present

## 2018-09-19 DIAGNOSIS — F419 Anxiety disorder, unspecified: Secondary | ICD-10-CM | POA: Diagnosis not present

## 2018-09-19 DIAGNOSIS — Y9389 Activity, other specified: Secondary | ICD-10-CM | POA: Diagnosis not present

## 2018-09-19 MED ORDER — DICLOFENAC SODIUM 1 % TD GEL: 4.0000 g | Freq: Four times a day (QID) | TRANSDERMAL | 1 refills | Status: DC

## 2018-09-19 MED ORDER — OXYCODONE HCL 5 MG PO TABS
5.0000 mg | ORAL_TABLET | Freq: Once | ORAL | Status: AC
  Administered 2018-09-19: 5 mg via ORAL
  Filled 2018-09-19: qty 1

## 2018-09-19 MED ORDER — HYDROCODONE-ACETAMINOPHEN 5-325 MG PO TABS: 1.0000 | ORAL_TABLET | Freq: Four times a day (QID) | ORAL | 0 refills | Status: AC | PRN

## 2018-09-19 MED ORDER — ACETAMINOPHEN 325 MG PO TABS
650.0000 mg | ORAL_TABLET | Freq: Once | ORAL | Status: AC
  Administered 2018-09-19: 650 mg via ORAL
  Filled 2018-09-19: qty 2

## 2018-09-19 NOTE — ED Provider Notes (Signed)
[facility] Emergency Department Provider Note ____________________________________________  Time seen: Approximately 3:09 PM  I have reviewed the triage vital signs and the nursing notes.  HISTORY  Chief Complaint Back Pain   HPI Pamela Ball is a 72 y.o. female who presents to the emergency department for treatment and evaluation of back pain.  She states that she fell 4 weeks ago and landed on her buttocks. Pain started a couple of days ago. She has been "eating BCs like candy." She has had a previous surgery on the lumbar area a few years ago. She denies numbness, tingling, or change in bladder/bowel habits.  Past Medical History:  Diagnosis Date  . Abdominal hernia with obstruction and without gangrene   . Anxiety 02/15/2017  . Bipolar 1 disorder, depressed, full remission ([place]) 02/15/2017  . Bipolar affective disorder ([place])   . COPD (chronic obstructive pulmonary disease) ([place]) 02/15/2017  . Depression   . Dyspnea   . GERD (gastroesophageal reflux disease) 02/15/2017  . Glaucoma   . Hiatal hernia   . History of abdominal hernia 05/21/2017  . History of compression fracture of spine 02/15/2017  . Hyperlipidemia   . Incisional hernia   . Incisional ventral hernia w obstruction 06/02/2017  . Normocytic anemia 05/23/2017  . 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 Presbycusis of both ears 02/15/2017    Patient Active Problem List   Diagnosis Date Noted  . Incisional hernia, without obstruction or gangrene 08/28/2018  . Allergic rhinitis due to allergen 05/14/2018  . Abdominal hernia with obstruction and without gangrene   . Normocytic anemia 05/23/2017  . History of abdominal hernia 05/21/2017  . Bipolar 1 disorder, depressed, full remission ([place]) 02/15/2017  . GERD (gastroesophageal reflux disease) 02/15/2017  . COPD (chronic obstructive pulmonary disease) ([place]) 02/15/2017  . 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  Hyperlipidemia 02/15/2017  . Presbycusis of both ears 02/15/2017  . History of compression fracture of spine 02/15/2017  . Anxiety 02/15/2017    Past Surgical History:  Procedure Laterality Date  . ABDOMINAL HYSTERECTOMY  11/06/2006  . CESAREAN SECTION     x3  . COLONOSCOPY WITH PROPOFOL N/A 06/25/2018   Procedure: COLONOSCOPY WITH PROPOFOL;  Surgeon: Jonathon Bellows, MD;  Location: [facility] ENDOSCOPY;  Service: Endoscopy;  Laterality: N/A;  . KYPHOPLASTY    . VENTRAL HERNIA REPAIR N/A 06/02/2017   Procedure: exploratory laparotomy repair of incarcerated  VENTRAL hernia;  Surgeon: Florene Glen, MD;  Location: [facility] ORS;  Service: General;  Laterality: N/A;  . VENTRAL HERNIA REPAIR N/A 08/29/2018   Procedure: LAPAROSCOPIC INCISIONAL HERNIA;  Surgeon: Olean Ree, MD;  Location: [facility] ORS;  Service: General;  Laterality: N/A;    Prior to Admission medications   Medication Sig Start Date End Date Taking? Authorizing Provider  buPROPion (WELLBUTRIN XL) 150 MG 24 hr tablet Take 150 mg by mouth daily.  01/11/15   [provider]  busPIRone (BUSPAR) 10 MG tablet Take 1 tablet (10 mg total) by mouth daily. Prescribed by Psychiatry Dr Kasandra Knudsen 11/14/17   Parks Ranger, Devonne Doughty, DO  cholecalciferol (VITAMIN D) 1000 units tablet Take 1,000 Units by mouth daily.    [provider]  diclofenac sodium (VOLTAREN) 1 % GEL Apply 4 g topically 4 (four) times daily. 09/19/18   Julus Kelley, Johnette Abraham B, FNP  HYDROcodone-acetaminophen (NORCO/VICODIN) 5-325 MG tablet Take 1 tablet by mouth every 6 (six) hours as needed for up to 3 days for severe pain. 09/19/18 09/22/18  Ember Gottwald, Dessa Phi,  FNP  ibuprofen (ADVIL,MOTRIN) 600 MG tablet Take 1 tablet (600 mg total) by mouth every 8 (eight) hours as needed for fever, mild pain or moderate pain. 08/29/18   Olean Ree, MD  Multiple Vitamin (MULTI-VITAMINS) TABS Take 1 tablet by mouth daily.     [provider]  OLANZapine (ZYPREXA) 5 MG tablet Take 5 mg by  mouth at bedtime.  01/11/15   [provider]  omeprazole (PRILOSEC) 20 MG capsule Take 1 capsule (20 mg total) by mouth daily before breakfast. 05/14/18   Parks Ranger, Devonne Doughty, DO  oxyCODONE (OXY IR/ROXICODONE) 5 MG immediate release tablet Take 1 tablet (5 mg total) by mouth every 4 (four) hours as needed for severe pain. 08/29/18   Olean Ree, MD  simvastatin (ZOCOR) 20 MG tablet Take 1 tablet (20 mg total) by mouth daily. 05/14/18   Parks Ranger, Devonne Doughty, DO  venlafaxine XR (EFFEXOR-XR) 75 MG 24 hr capsule Take 75 mg by mouth daily with breakfast.  01/11/15   [provider]  vitamin A 10000 UNIT capsule Take 10,000 Units by mouth daily.    [provider]  vitamin C (ASCORBIC ACID) 500 MG tablet Take 500 mg by mouth daily.    [provider]    Allergies Patient has no known allergies.  Family History  Problem Relation Age of Onset  . Cancer Mother        cancer  . Breast cancer Mother 46    Social History Social History   Tobacco Use  . Smoking status: Never Smoker  . Smokeless tobacco: Never Used  Substance Use Topics  . Alcohol use: No  . Drug use: No    Review of Systems Constitutional: Well appearing. Respiratory: Negative for dyspnea. Cardiovascular: Negative for change in skin temperature or color. Musculoskeletal:   Negative for chronic steroid use   Negative for trauma in the presence of osteoporosis  Positive for age over 58 and trauma.  Negative for constitutional symptoms, or history of cancer  Negative for pain worse at night. Skin: Negative for rash, lesion, or wound.  Genitourinary: Negative for urinary retention. Rectal: Negative for fecal incontinence or new onset constipation/bowel habit changes. Hematological/Immunilogical: Negative for immunosuppression, IV drug use, or fever Neurological: Negative for burning, tingling, numb, electric, radiating pain in the right or left lower extremity.                         Negative for saddle anesthesia.                        Negative for focal neurologic deficit, progressive or disabling symptoms             Negative for saddle anesthesia. ____________________________________________   PHYSICAL EXAM:  VITAL SIGNS: ED Triage Vitals  Enc Vitals Group     BP 09/19/18 0932 (!) 156/80     Pulse Rate 09/19/18 0932 84     Resp 09/19/18 0932 18     Temp 09/19/18 0932 98.3 F (36.8 C)     Temp Source 09/19/18 0932 Oral     SpO2 09/19/18 0932 94 %     Weight 09/19/18 0933 130 lb (59 kg)     Height 09/19/18 0933 4\' 10"  (1.473 m)     Head Circumference --      Peak Flow --      Pain Score 09/19/18 0932 10     Pain Loc --  Pain Edu? --      Excl. in [place]? --     Constitutional: Alert and oriented. Well appearing and in no acute distress. Eyes: Conjunctivae are clear without discharge or drainage.  Head: Atraumatic. Neck: Full, active range of motion. Respiratory: Respirations even and unlabored. Musculoskeletal: Decreased ROM of the back and extremities due to pain, Strength 5/5 of the lower extremities as tested. Observed ambulating with minimal assistance. Gait steady. Neurologic: Reflexes of the lower extremities are 2+. Negative straight leg raise on the right and left side. Skin: Atraumatic.  Psychiatric: Behavior and affect are normal.  ____________________________________________   LABS (all labs ordered are listed, but only abnormal results are displayed)  Labs Reviewed - No data to display ____________________________________________  RADIOLOGY  CT of the lumbar and thoracic spine shows a T10 compression fracture with 30% loss of vertebral body height.  ____________________________________________   PROCEDURES  Procedure(s) performed:  Procedures ____________________________________________   INITIAL IMPRESSION / ASSESSMENT AND PLAN / ED COURSE  Pamela Ball is a 72 y.o. female who presents to the emergency  department for treatment and evaluation of back pain.  She had a fall approximately 4 weeks ago, but states that the pain did not get bad until a few days ago.  X-ray of the lumbar spine showed a T10 fracture of the superior endplate.  Further evaluation with CT shows a compression fracture with 30% loss of vertebral body height and minimal endplate retropulsion without any other complicating features.  Results were discussed with the patient and her son.  Case was discussed with Dr. Harlow Mares with orthopedics who advised that neurosurgery would need to be involved instead.  Dr. Joellen Tullos Caraway was consulted and he recommends a TLSO back brace.  Brace was ordered and before the person could come to fit the patient, she and her son left the department.  The nurse attempted to call the patient while the representative from the brace company was still here and she declined the request to return.  The son has contacted Dr. Nelly Laurence office for an appointment.  They were encouraged to call back to let them know that they did not get a brace from the emergency department to see if that made any difference in when she should be seen in the office.  Medications  oxyCODONE (Oxy IR/ROXICODONE) immediate release tablet 5 mg (5 mg Oral Given 09/19/18 0959)  acetaminophen (TYLENOL) tablet 650 mg (650 mg Oral Given 09/19/18 0959)    ED Discharge Orders         Ordered    diclofenac sodium (VOLTAREN) 1 % GEL  4 times daily     09/19/18 1216    HYDROcodone-acetaminophen (NORCO/VICODIN) 5-325 MG tablet  Every 6 hours PRN     09/19/18 1216           Pertinent labs & imaging results that were available during my care of the patient were reviewed by me and considered in my medical decision making (see chart for details).  _________________________________________   FINAL CLINICAL IMPRESSION(S) / ED DIAGNOSES  Final diagnoses:  Acute lumbar back pain  Compression fracture of thoracic vertebra, initial encounter  ([place])     If controlled substance prescribed during this visit, 12 month history viewed on the [place] prior to issuing an initial prescription for Schedule II or III opiod. Victorino Dike, FNP 09/19/18 1548    Schuyler Amor, MD 09/19/18 [phone]

## 2018-09-19 NOTE — ED Triage Notes (Signed)
First RN Note: Pt c/o lower back pain, pt states hx of fracture to that area of her back.

## 2018-09-19 NOTE — ED Notes (Signed)
[employer] representative here, room found to be empty at this time with no signs of patient return.  This RN contacted son, Fredonia Highland who was with patient during visit.  Son states, "Yes we left, we were there for 2 1/2 hours and my mom was ready to get home."  Brace unable to be provided to patient due to this reason.

## 2018-09-19 NOTE — ED Notes (Addendum)
See triage note  Presents with lower back pain  States she tripped over a rug and fell couple of weeks ago  States she did not have pain at that time.now having lower back pain which is moving around to lower abd  Denies any n/v/d/,fever or urinary sxs'  States she has had back surgery in past  Has been using Goody's powder for pain w/o relief

## 2018-09-19 NOTE — ED Triage Notes (Signed)
Pt arrives with complaints of lower back pain. No loss of continence. No injury. Hx of back injury. Pt states laying down eases the pain but movement intensifies the pain.

## 2018-09-19 NOTE — ED Notes (Signed)
Pt's son to nurses station, asking about follow up appointment.  Son provided number to [facility] to set up follow up appointment.  Son and patient asking questions such as, "Can I just leave and come back to pick up the brace?"  This RN explained that the brace would be fitted to her so that she must be here in order for brace to be provided.  PA, Cari-Beth also explained the necessity and importance of the brace to family member at this time.

## 2018-09-19 NOTE — ED Notes (Signed)
[employer] notified per Jonelle Sidle, RN to fit and apply TSLO brace.

## 2018-09-19 NOTE — Discharge Instructions (Signed)
Please call and schedule an appointment with Dr. Izora Ribas.   The pain medication that was prescribed will make you sleepy and possibly dizzy. Please change positions slowly. Stop taking the medication if it causes excessive dizziness or drowsiness.  Return to the ER for symptoms that change or worsen if unable to see primary care or the specialist.

## 2018-09-20 DIAGNOSIS — S22070A Wedge compression fracture of T9-T10 vertebra, initial encounter for closed fracture: Secondary | ICD-10-CM | POA: Diagnosis not present

## 2018-09-22 MED ORDER — CEFAZOLIN SODIUM-DEXTROSE 1-4 GM/50ML-% IV SOLN
1.0000 g | Freq: Once | INTRAVENOUS | Status: AC
  Administered 2018-09-23: 1 g via INTRAVENOUS

## 2018-09-23 ENCOUNTER — Ambulatory Visit: Payer: Medicare Other

## 2018-09-23 ENCOUNTER — Ambulatory Visit: Payer: Medicare Other | Admitting: Anesthesiology

## 2018-09-23 ENCOUNTER — Other Ambulatory Visit: Payer: Self-pay

## 2018-09-23 ENCOUNTER — Ambulatory Visit
Admission: RE | Admit: 2018-09-23 | Discharge: 2018-09-23 | Disposition: A | Discharge status: Home / Self Care | Payer: Medicare Other | Source: Ambulatory Visit | Attending: Orthopedic Surgery | Admitting: Orthopedic Surgery

## 2018-09-23 ENCOUNTER — Encounter
Admission: RE | Disposition: A | Discharge status: Home / Self Care | Payer: Self-pay | Source: Ambulatory Visit | Attending: Orthopedic Surgery

## 2018-09-23 DIAGNOSIS — F319 Bipolar disorder, unspecified: Secondary | ICD-10-CM | POA: Insufficient documentation

## 2018-09-23 DIAGNOSIS — M810 Age-related osteoporosis without current pathological fracture: Secondary | ICD-10-CM | POA: Diagnosis not present

## 2018-09-23 DIAGNOSIS — F419 Anxiety disorder, unspecified: Secondary | ICD-10-CM | POA: Insufficient documentation

## 2018-09-23 DIAGNOSIS — R079 Chest pain, unspecified: Secondary | ICD-10-CM | POA: Diagnosis not present

## 2018-09-23 DIAGNOSIS — S22070A Wedge compression fracture of T9-T10 vertebra, initial encounter for closed fracture: Secondary | ICD-10-CM

## 2018-09-23 DIAGNOSIS — Z803 Family history of malignant neoplasm of breast: Secondary | ICD-10-CM | POA: Diagnosis not present

## 2018-09-23 DIAGNOSIS — I351 Nonrheumatic aortic (valve) insufficiency: Secondary | ICD-10-CM | POA: Diagnosis not present

## 2018-09-23 DIAGNOSIS — Z419 Encounter for procedure for purposes other than remedying health state, unspecified: Secondary | ICD-10-CM

## 2018-09-23 DIAGNOSIS — I4510 Unspecified right bundle-branch block: Secondary | ICD-10-CM | POA: Diagnosis not present

## 2018-09-23 DIAGNOSIS — J449 Chronic obstructive pulmonary disease, unspecified: Secondary | ICD-10-CM | POA: Insufficient documentation

## 2018-09-23 DIAGNOSIS — K219 Gastro-esophageal reflux disease without esophagitis: Secondary | ICD-10-CM

## 2018-09-23 DIAGNOSIS — Z4789 Encounter for other orthopedic aftercare: Secondary | ICD-10-CM | POA: Diagnosis not present

## 2018-09-23 DIAGNOSIS — S299XXA Unspecified injury of thorax, initial encounter: Secondary | ICD-10-CM | POA: Diagnosis not present

## 2018-09-23 DIAGNOSIS — I248 Other forms of acute ischemic heart disease: Secondary | ICD-10-CM | POA: Diagnosis not present

## 2018-09-23 DIAGNOSIS — I340 Nonrheumatic mitral (valve) insufficiency: Secondary | ICD-10-CM | POA: Diagnosis not present

## 2018-09-23 DIAGNOSIS — Z79899 Other long term (current) drug therapy: Secondary | ICD-10-CM | POA: Insufficient documentation

## 2018-09-23 DIAGNOSIS — R0989 Other specified symptoms and signs involving the circulatory and respiratory systems: Secondary | ICD-10-CM | POA: Diagnosis not present

## 2018-09-23 DIAGNOSIS — I214 Non-ST elevation (NSTEMI) myocardial infarction: Secondary | ICD-10-CM | POA: Diagnosis not present

## 2018-09-23 DIAGNOSIS — R54 Age-related physical debility: Secondary | ICD-10-CM | POA: Diagnosis not present

## 2018-09-23 DIAGNOSIS — M545 Low back pain: Secondary | ICD-10-CM | POA: Diagnosis not present

## 2018-09-23 DIAGNOSIS — I10 Essential (primary) hypertension: Secondary | ICD-10-CM | POA: Diagnosis not present

## 2018-09-23 DIAGNOSIS — W19XXXA Unspecified fall, initial encounter: Secondary | ICD-10-CM | POA: Insufficient documentation

## 2018-09-23 DIAGNOSIS — E876 Hypokalemia: Secondary | ICD-10-CM | POA: Diagnosis not present

## 2018-09-23 DIAGNOSIS — E785 Hyperlipidemia, unspecified: Secondary | ICD-10-CM

## 2018-09-23 DIAGNOSIS — M546 Pain in thoracic spine: Secondary | ICD-10-CM | POA: Diagnosis not present

## 2018-09-23 DIAGNOSIS — R7989 Other specified abnormal findings of blood chemistry: Secondary | ICD-10-CM | POA: Diagnosis not present

## 2018-09-23 DIAGNOSIS — Z9181 History of falling: Secondary | ICD-10-CM | POA: Diagnosis not present

## 2018-09-23 DIAGNOSIS — M549 Dorsalgia, unspecified: Secondary | ICD-10-CM | POA: Diagnosis not present

## 2018-09-23 HISTORY — PX: KYPHOPLASTY: SHX5884

## 2018-09-23 SURGERY — KYPHOPLASTY
Anesthesia: General | Site: Back

## 2018-09-23 MED ORDER — LIDOCAINE HCL 1 % IJ SOLN
INTRAMUSCULAR | Status: DC | PRN
  Administered 2018-09-23: 20 mL

## 2018-09-23 MED ORDER — MIDAZOLAM HCL 2 MG/2ML IJ SOLN
INTRAMUSCULAR | Status: DC | PRN
  Administered 2018-09-23: 2 mg via INTRAVENOUS

## 2018-09-23 MED ORDER — KETAMINE HCL 50 MG/ML IJ SOLN
INTRAMUSCULAR | Status: DC | PRN
  Administered 2018-09-23: 50 mg via INTRAMUSCULAR

## 2018-09-23 MED ORDER — OXYCODONE HCL 5 MG PO TABS
5.0000 mg | ORAL_TABLET | Freq: Four times a day (QID) | ORAL | Status: DC | PRN
  Administered 2018-09-23: 5 mg via ORAL

## 2018-09-23 MED ORDER — LACTATED RINGERS IV SOLN
INTRAVENOUS | Status: DC
  Administered 2018-09-23: 15:00:00 via INTRAVENOUS

## 2018-09-23 MED ORDER — OXYCODONE HCL 5 MG PO TABS: 5.0000 mg | ORAL_TABLET | Freq: Four times a day (QID) | ORAL | 0 refills | Status: DC | PRN

## 2018-09-23 MED ORDER — METOCLOPRAMIDE HCL 10 MG PO TABS: 5.0000 mg | ORAL_TABLET | Freq: Three times a day (TID) | ORAL | Status: DC | PRN

## 2018-09-23 MED ORDER — ONDANSETRON HCL 4 MG PO TABS: 4.0000 mg | ORAL_TABLET | Freq: Four times a day (QID) | ORAL | Status: DC | PRN

## 2018-09-23 MED ORDER — ONDANSETRON HCL 4 MG/2ML IJ SOLN: 4.0000 mg | Freq: Four times a day (QID) | INTRAMUSCULAR | Status: DC | PRN

## 2018-09-23 MED ORDER — FAMOTIDINE 20 MG PO TABS
ORAL_TABLET | ORAL | Status: AC
  Administered 2018-09-23: 20 mg
  Filled 2018-09-23: qty 1

## 2018-09-23 MED ORDER — PROPOFOL 500 MG/50ML IV EMUL
INTRAVENOUS | Status: DC | PRN
  Administered 2018-09-23: 75 ug/kg/min via INTRAVENOUS

## 2018-09-23 MED ORDER — HYDROMORPHONE HCL 1 MG/ML IJ SOLN: 0.2500 mg | INTRAMUSCULAR | Status: DC | PRN

## 2018-09-23 MED ORDER — HYDROCODONE-ACETAMINOPHEN 5-325 MG PO TABS: 1.0000 | ORAL_TABLET | ORAL | Status: DC | PRN

## 2018-09-23 MED ORDER — CEFAZOLIN SODIUM-DEXTROSE 1-4 GM/50ML-% IV SOLN
INTRAVENOUS | Status: AC
  Filled 2018-09-23: qty 50

## 2018-09-23 MED ORDER — OXYCODONE HCL 5 MG PO TABS
ORAL_TABLET | ORAL | Status: AC
  Administered 2018-09-23: 5 mg via ORAL
  Filled 2018-09-23: qty 1

## 2018-09-23 MED ORDER — FAMOTIDINE 20 MG PO TABS: 20.0000 mg | ORAL_TABLET | Freq: Once | ORAL | Status: DC

## 2018-09-23 MED ORDER — METOCLOPRAMIDE HCL 5 MG/ML IJ SOLN: 5.0000 mg | Freq: Three times a day (TID) | INTRAMUSCULAR | Status: DC | PRN

## 2018-09-23 MED ORDER — IOPAMIDOL (ISOVUE-M 200) INJECTION 41%
INTRAMUSCULAR | Status: AC
  Filled 2018-09-23: qty 10

## 2018-09-23 MED ORDER — MIDAZOLAM HCL 2 MG/2ML IJ SOLN
INTRAMUSCULAR | Status: AC
  Filled 2018-09-23: qty 2

## 2018-09-23 MED ORDER — KETAMINE HCL 50 MG/ML IJ SOLN
INTRAMUSCULAR | Status: AC
  Filled 2018-09-23: qty 10

## 2018-09-23 MED ORDER — BUPIVACAINE-EPINEPHRINE (PF) 0.5% -1:200000 IJ SOLN
INTRAMUSCULAR | Status: DC | PRN
  Administered 2018-09-23: 10 mL via PERINEURAL

## 2018-09-23 MED ORDER — PROPOFOL 500 MG/50ML IV EMUL
INTRAVENOUS | Status: AC
  Filled 2018-09-23: qty 50

## 2018-09-23 MED ORDER — SODIUM CHLORIDE 0.9 % IV SOLN: INTRAVENOUS | Status: DC

## 2018-09-23 SURGICAL SUPPLY — 17 items
CEMENT KYPHON CX01A KIT/MIXER (Cement) ×3 IMPLANT
COVER WAND RF STERILE (DRAPES) ×2 IMPLANT
DERMABOND ADVANCED (GAUZE/BANDAGES/DRESSINGS) ×1
DERMABOND ADVANCED .7 DNX12 (GAUZE/BANDAGES/DRESSINGS) ×1 IMPLANT
DEVICE BIOPSY BONE KYPH (INSTRUMENTS) ×1 IMPLANT
DEVICE BIOPSY BONE KYPHX (INSTRUMENTS) ×2 IMPLANT
DRAPE C-ARM XRAY 36X54 (DRAPES) ×2 IMPLANT
DURAPREP 26ML APPLICATOR (WOUND CARE) ×2 IMPLANT
GLOVE SURG SYN 9.0  PF PI (GLOVE) ×1
GLOVE SURG SYN 9.0 PF PI (GLOVE) ×1 IMPLANT
GOWN SRG 2XL LVL 4 RGLN SLV (GOWNS) ×1 IMPLANT
GOWN STRL NON-REIN 2XL LVL4 (GOWNS) ×1
GOWN STRL REUS W/ TWL LRG LVL3 (GOWN DISPOSABLE) ×1 IMPLANT
GOWN STRL REUS W/TWL LRG LVL3 (GOWN DISPOSABLE) ×1
PACK KYPHOPLASTY (MISCELLANEOUS) ×2 IMPLANT
STRAP SAFETY 5IN WIDE (MISCELLANEOUS) ×2 IMPLANT
TRAY KYPHOPAK 15/2 EXPRESS (KITS) ×1 IMPLANT

## 2018-09-23 NOTE — Anesthesia Preprocedure Evaluation (Addendum)
Anesthesia Evaluation  Patient identified by MRN, date of birth, ID band Patient awake    Reviewed: Allergy & Precautions, H&P , NPO status , Patient's Chart, lab work & pertinent test results  Airway Mallampati: II  TM Distance: <3 FB    Comment: Small chin, small mouth Dental   Pulmonary COPD,           Cardiovascular (-) Past MI and (-) CABG negative cardio ROS  (-) dysrhythmias (-) pacemaker     Neuro/Psych negative neurological ROS  negative psych ROS   GI/Hepatic Neg liver ROS, hiatal hernia, GERD  ,  Endo/Other  negative endocrine ROS  Renal/GU negative Renal ROS  negative genitourinary   Musculoskeletal  (+) Arthritis ,   Abdominal   Peds  Hematology  (+) Blood dyscrasia, anemia ,   Anesthesia Other Findings Past Medical History: No date: Abdominal hernia with obstruction and without gangrene 02/15/2017: Anxiety 02/15/2017: Bipolar 1 disorder, depressed, full remission ([place]) No date: Bipolar affective disorder ([place]) 02/15/2017: COPD (chronic obstructive pulmonary disease) ([place]) No date: Depression No date: Dyspnea 02/15/2017: GERD (gastroesophageal reflux disease) No date: Glaucoma No date: Hiatal hernia 05/21/2017: History of abdominal hernia 02/15/2017: History of compression fracture of spine No date: Hyperlipidemia No date: Incisional hernia 06/02/2017: Incisional ventral hernia w obstruction 05/23/2017: Normocytic anemia 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, 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  Past Surgical History: 11/06/2006: ABDOMINAL HYSTERECTOMY No date: CESAREAN SECTION     Comment:  x3 06/25/2018: COLONOSCOPY WITH PROPOFOL; N/A     Comment:  Procedure: COLONOSCOPY WITH PROPOFOL;  Surgeon: Jonathon Bellows, MD;  Location[facility] ENDOSCOPY;  Service:               Endoscopy;  Laterality: N/A; No date: KYPHOPLASTY 06/02/2017: VENTRAL HERNIA REPAIR; N/A     Comment:  Procedure:  exploratory laparotomy repair of incarcerated              VENTRAL hernia;  Surgeon: Florene Glen, MD;                Location: [facility] ORS;  Service: General;  Laterality: N/A; 08/29/2018: VENTRAL HERNIA REPAIR; N/A     Comment:  Procedure: LAPAROSCOPIC INCISIONAL HERNIA;  Surgeon:               Olean Ree, MD;  Location: [facility] ORS;  Service:               General;  Laterality: N/A;  BMI    Body Mass Index:  27.17 kg/m      Reproductive/Obstetrics negative OB ROS                            Anesthesia Physical Anesthesia Plan  ASA: III  Anesthesia Plan: General   Post-op Pain Management:    Induction:   PONV Risk Score and Plan: Propofol infusion and TIVA  Airway Management Planned: Natural Airway and Nasal Cannula  Additional Equipment:   Intra-op Plan:   Post-operative Plan:   Informed Consent: I have reviewed the patients History and Physical, chart, labs and discussed the procedure including the risks, benefits and alternatives for the proposed anesthesia with the patient or authorized representative who has indicated his/her understanding and acceptance.   Dental Advisory Given  Plan Discussed with: Anesthesiologist, CRNA and Surgeon  Anesthesia Plan Comments:         Anesthesia  Quick Evaluation

## 2018-09-23 NOTE — Transfer of Care (Signed)
Immediate Anesthesia Transfer of Care Note  Patient: Pamela Ball  Procedure(s) Performed: KYPHOPLASTY T10 (N/A Back)  Patient Location: PACU  Anesthesia Type:General  Level of Consciousness: sedated  Airway & Oxygen Therapy: Patient Spontanous Breathing and Patient connected to nasal cannula oxygen  Post-op Assessment: Report given to RN and Post -op Vital signs reviewed and stable  Post vital signs: Reviewed and stable  Last Vitals:  Vitals Value Taken Time  BP 131/75 09/23/2018  3:24 PM  Temp 36.3 C 09/23/2018  3:24 PM  Pulse 97 09/23/2018  3:24 PM  Resp 15 09/23/2018  3:26 PM  SpO2 97 % 09/23/2018  3:24 PM  Vitals shown include unvalidated device data.  Last Pain:  Vitals:   09/23/18 1524  TempSrc:   PainSc: Asleep         Complications: No apparent anesthesia complications

## 2018-09-23 NOTE — Progress Notes (Signed)
Pts tooth and gold colored ring given to husband

## 2018-09-23 NOTE — OR Nursing (Signed)
Discussed discharge instructions with pt and husband. Both voice understanding. 

## 2018-09-23 NOTE — Discharge Instructions (Signed)
Take it easy today and tomorrow, resume more normal activities on Thursday.  Remove Band-Aid and okay to shower starting Thursday.  Pain medicine as directed.

## 2018-09-23 NOTE — Anesthesia Postprocedure Evaluation (Signed)
Anesthesia Post Note  Patient: Pamela Ball  Procedure(s) Performed: KYPHOPLASTY T10 (N/A Back)  Patient location during evaluation: PACU Anesthesia Type: General Level of consciousness: awake and alert Pain management: pain level controlled Vital Signs Assessment: post-procedure vital signs reviewed and stable Respiratory status: spontaneous breathing, nonlabored ventilation, respiratory function stable and patient connected to nasal cannula oxygen Cardiovascular status: blood pressure returned to baseline and stable Postop Assessment: no apparent nausea or vomiting Anesthetic complications: no     Last Vitals:  Vitals:   09/23/18 1602 09/23/18 1630  BP: (!) 164/79 (!) 173/83  Pulse: 90 88  Resp: 16   Temp: (!) 36.3 C   SpO2: 98% 98%    Last Pain:  Vitals:   09/23/18 1602  TempSrc: Oral  PainSc: 3                  [name]

## 2018-09-23 NOTE — Op Note (Signed)
09/23/2018  3:26 PM  PATIENT:  Pamela Ball  72 y.o. female  PRE-OPERATIVE DIAGNOSIS:  CLOSED WEDGE COMPRESSION FRACTURE 10TH VERTEBRA  POST-OPERATIVE DIAGNOSIS:  CLOSED WEDGE COMPRESSION FRACTURE   PROCEDURE:  Procedure(s): KYPHOPLASTY T10 (N/A)  SURGEON: Laurene Footman, MD  ASSISTANTS: None  ANESTHESIA:   local and MAC  EBL:  Total I/O In: 300 [I.V.:300] Out: -   BLOOD ADMINISTERED:none  DRAINS: none   LOCAL MEDICATIONS USED:  MARCAINE    and LIDOCAINE   SPECIMEN:  No Specimen  DISPOSITION OF SPECIMEN:  N/A  COUNTS:  YES  TOURNIQUET:  * No tourniquets in log *  IMPLANTS: Bone cement  DICTATION: .Dragon Dictation patient was brought to the operating room and after adequate anesthesia was obtained the patient was placed prone.  C arm was brought in in good visualization of the affected levels obtained on both AP and lateral projections.  After patient identification and timeout procedures were completed, local anesthetic was infiltrated with 10 cc 1% Xylocaine infiltrated subcutaneously.  This is done the area on the right side of the planned approach.  The back was then prepped and draped you sterile manner and repeat timeout procedure carried out.  A spinal needle was brought down to the pedicle on the right side of T10 and a 50-50 mix of 1% Xylocaine half percent Sensorcaine with epinephrine total of 20 cc injected.  After allowing this to set a small incision was made and the express trocar was advanced into the vertebral body in an extrapedicular fashion.  Biopsy was attempted but not obtained.  Drilling was carried out balloon inserted with inflation to 2 cc.  When the cement was appropriate consistency 3-1/2 cc were injected into the vertebral body without extravasation, good fill superior to inferior endplates and from right to left sides along the inferior endplate.  After the cemented set the trochars removed and permanent serum views obtained.  The wound was  closed with Dermabond followed by Band-Aid  PLAN OF CARE: Discharge to home after PACU  PATIENT DISPOSITION:  PACU - hemodynamically stable.

## 2018-09-23 NOTE — H&P (Signed)
Reviewed paper H+P, will be scanned into chart. No changes noted.  

## 2018-09-23 NOTE — Anesthesia Post-op Follow-up Note (Signed)
Anesthesia QCDR form completed.        

## 2018-09-23 NOTE — Anesthesia Procedure Notes (Signed)
Date/Time: 09/23/2018 2:57 PM Performed by: Nelda Marseille, CRNA Pre-anesthesia Checklist: Patient identified, Emergency Drugs available, Suction available, Patient being monitored and Timeout performed Oxygen Delivery Method: Nasal cannula

## 2018-09-25 ENCOUNTER — Encounter: Payer: Self-pay | Admitting: Orthopedic Surgery

## 2018-09-26 ENCOUNTER — Other Ambulatory Visit: Payer: Self-pay

## 2018-09-26 ENCOUNTER — Emergency Department: Payer: Medicare Other

## 2018-09-26 ENCOUNTER — Inpatient Hospital Stay: Payer: Medicare Other

## 2018-09-26 ENCOUNTER — Inpatient Hospital Stay
Admission: EM | Admit: 2018-09-26 | Discharge: 2018-09-27 | DRG: 982 | Disposition: A | Discharge status: Home Health | Payer: Medicare Other | Attending: Specialist | Admitting: Specialist

## 2018-09-26 DIAGNOSIS — E876 Hypokalemia: Secondary | ICD-10-CM | POA: Diagnosis present

## 2018-09-26 DIAGNOSIS — I340 Nonrheumatic mitral (valve) insufficiency: Secondary | ICD-10-CM | POA: Diagnosis not present

## 2018-09-26 DIAGNOSIS — S22070A Wedge compression fracture of T9-T10 vertebra, initial encounter for closed fracture: Secondary | ICD-10-CM | POA: Diagnosis not present

## 2018-09-26 DIAGNOSIS — R7989 Other specified abnormal findings of blood chemistry: Secondary | ICD-10-CM | POA: Diagnosis not present

## 2018-09-26 DIAGNOSIS — Z9181 History of falling: Secondary | ICD-10-CM

## 2018-09-26 DIAGNOSIS — W19XXXA Unspecified fall, initial encounter: Secondary | ICD-10-CM | POA: Diagnosis present

## 2018-09-26 DIAGNOSIS — M545 Low back pain, unspecified: Secondary | ICD-10-CM

## 2018-09-26 DIAGNOSIS — J449 Chronic obstructive pulmonary disease, unspecified: Secondary | ICD-10-CM | POA: Diagnosis not present

## 2018-09-26 DIAGNOSIS — K219 Gastro-esophageal reflux disease without esophagitis: Secondary | ICD-10-CM | POA: Diagnosis not present

## 2018-09-26 DIAGNOSIS — I214 Non-ST elevation (NSTEMI) myocardial infarction: Secondary | ICD-10-CM | POA: Diagnosis not present

## 2018-09-26 DIAGNOSIS — S299XXA Unspecified injury of thorax, initial encounter: Secondary | ICD-10-CM | POA: Diagnosis not present

## 2018-09-26 DIAGNOSIS — R079 Chest pain, unspecified: Principal | ICD-10-CM | POA: Diagnosis present

## 2018-09-26 DIAGNOSIS — F419 Anxiety disorder, unspecified: Secondary | ICD-10-CM | POA: Diagnosis present

## 2018-09-26 DIAGNOSIS — F319 Bipolar disorder, unspecified: Secondary | ICD-10-CM | POA: Diagnosis present

## 2018-09-26 DIAGNOSIS — R0989 Other specified symptoms and signs involving the circulatory and respiratory systems: Secondary | ICD-10-CM | POA: Diagnosis present

## 2018-09-26 DIAGNOSIS — I248 Other forms of acute ischemic heart disease: Secondary | ICD-10-CM | POA: Diagnosis not present

## 2018-09-26 DIAGNOSIS — E785 Hyperlipidemia, unspecified: Secondary | ICD-10-CM | POA: Diagnosis present

## 2018-09-26 DIAGNOSIS — Z803 Family history of malignant neoplasm of breast: Secondary | ICD-10-CM | POA: Diagnosis not present

## 2018-09-26 DIAGNOSIS — R778 Other specified abnormalities of plasma proteins: Secondary | ICD-10-CM | POA: Diagnosis present

## 2018-09-26 DIAGNOSIS — M546 Pain in thoracic spine: Secondary | ICD-10-CM | POA: Diagnosis not present

## 2018-09-26 DIAGNOSIS — I4510 Unspecified right bundle-branch block: Secondary | ICD-10-CM | POA: Diagnosis present

## 2018-09-26 DIAGNOSIS — M810 Age-related osteoporosis without current pathological fracture: Secondary | ICD-10-CM | POA: Diagnosis not present

## 2018-09-26 DIAGNOSIS — R54 Age-related physical debility: Secondary | ICD-10-CM | POA: Diagnosis not present

## 2018-09-26 DIAGNOSIS — I10 Essential (primary) hypertension: Secondary | ICD-10-CM | POA: Diagnosis not present

## 2018-09-26 DIAGNOSIS — I351 Nonrheumatic aortic (valve) insufficiency: Secondary | ICD-10-CM | POA: Diagnosis not present

## 2018-09-26 LAB — CBC WITH DIFFERENTIAL/PLATELET
Abs Immature Granulocytes: 0.14 10*3/uL — ABNORMAL HIGH (ref 0.00–0.07)
Basophils Absolute: 0 10*3/uL (ref 0.0–0.1)
Basophils Relative: 0 %
EOS ABS: 0 10*3/uL (ref 0.0–0.5)
EOS PCT: 0 %
HEMATOCRIT: 44.7 % (ref 36.0–46.0)
Hemoglobin: 14.7 g/dL (ref 12.0–15.0)
IMMATURE GRANULOCYTES: 2 %
LYMPHS ABS: 0.8 10*3/uL (ref 0.7–4.0)
Lymphocytes Relative: 8 %
MCH: 32 pg (ref 26.0–34.0)
MCHC: 32.9 g/dL (ref 30.0–36.0)
MCV: 97.4 fL (ref 80.0–100.0)
MONOS PCT: 19 %
Monocytes Absolute: 1.8 10*3/uL — ABNORMAL HIGH (ref 0.1–1.0)
NEUTROS PCT: 71 %
Neutro Abs: 6.6 10*3/uL (ref 1.7–7.7)
Platelets: 215 10*3/uL (ref 150–400)
RBC: 4.59 MIL/uL (ref 3.87–5.11)
RDW: 12.9 % (ref 11.5–15.5)
WBC: 9.3 10*3/uL (ref 4.0–10.5)
nRBC: 0 % (ref 0.0–0.2)

## 2018-09-26 LAB — COMPREHENSIVE METABOLIC PANEL
ALT: 21 U/L (ref 0–44)
AST: 33 U/L (ref 15–41)
Albumin: 3.2 g/dL — ABNORMAL LOW (ref 3.5–5.0)
Alkaline Phosphatase: 148 U/L — ABNORMAL HIGH (ref 38–126)
Anion gap: 13 (ref 5–15)
BILIRUBIN TOTAL: 1.3 mg/dL — AB (ref 0.3–1.2)
BUN: 12 mg/dL (ref 8–23)
CALCIUM: 8.7 mg/dL — AB (ref 8.9–10.3)
CO2: 28 mmol/L (ref 22–32)
Chloride: 97 mmol/L — ABNORMAL LOW (ref 98–111)
Creatinine, Ser: 0.38 mg/dL — ABNORMAL LOW (ref 0.44–1.00)
GFR calc Af Amer: >60 mL/min (ref >60)
Glucose, Bld: 147 mg/dL — ABNORMAL HIGH (ref 70–99)
POTASSIUM: 3.4 mmol/L — AB (ref 3.5–5.1)
Sodium: 138 mmol/L (ref 135–145)
TOTAL PROTEIN: 6.6 g/dL (ref 6.5–8.1)

## 2018-09-26 LAB — TROPONIN I
TROPONIN I: 0.65 ng/mL — AB (ref <0.03)
Troponin I: 0.89 ng/mL (ref <0.03)
Troponin I: 0.33 ng/mL (ref <0.03)

## 2018-09-26 LAB — LIPID PANEL
CHOL/HDL RATIO: 2.8 ratio
CHOLESTEROL: 133 mg/dL (ref 0–200)
HDL: 47 mg/dL (ref >40)
LDL Cholesterol: 72 mg/dL (ref 0–99)
TRIGLYCERIDES: 72 mg/dL (ref <150)
VLDL: 14 mg/dL (ref 0–40)

## 2018-09-26 LAB — HEMOGLOBIN A1C
Hgb A1c MFr Bld: 6.2 % — ABNORMAL HIGH (ref 4.8–5.6)
MEAN PLASMA GLUCOSE: 131.24 mg/dL

## 2018-09-26 MED ORDER — OXYCODONE-ACETAMINOPHEN 5-325 MG PO TABS
2.0000 | ORAL_TABLET | Freq: Once | ORAL | Status: AC
  Administered 2018-09-26: 2 via ORAL

## 2018-09-26 MED ORDER — VITAMIN C 500 MG PO TABS
500.0000 mg | ORAL_TABLET | Freq: Every day | ORAL | Status: DC
  Administered 2018-09-27: 500 mg via ORAL
  Filled 2018-09-26: qty 1

## 2018-09-26 MED ORDER — HYDRALAZINE HCL 20 MG/ML IJ SOLN: 10.0000 mg | Freq: Four times a day (QID) | INTRAMUSCULAR | Status: DC | PRN

## 2018-09-26 MED ORDER — ACETAMINOPHEN 325 MG PO TABS: 650.0000 mg | ORAL_TABLET | Freq: Four times a day (QID) | ORAL | Status: DC | PRN

## 2018-09-26 MED ORDER — ONDANSETRON HCL 4 MG PO TABS: 4.0000 mg | ORAL_TABLET | Freq: Four times a day (QID) | ORAL | Status: DC | PRN

## 2018-09-26 MED ORDER — POLYETHYLENE GLYCOL 3350 17 G PO PACK: 17.0000 g | PACK | Freq: Every day | ORAL | Status: DC | PRN

## 2018-09-26 MED ORDER — OLANZAPINE 5 MG PO TABS
5.0000 mg | ORAL_TABLET | Freq: Every day | ORAL | Status: DC
  Administered 2018-09-26: 5 mg via ORAL
  Filled 2018-09-26 (×2): qty 1

## 2018-09-26 MED ORDER — BUPROPION HCL ER (XL) 150 MG PO TB24
150.0000 mg | ORAL_TABLET | Freq: Every morning | ORAL | Status: DC
  Administered 2018-09-27: 150 mg via ORAL
  Filled 2018-09-26: qty 1

## 2018-09-26 MED ORDER — ASPIRIN 81 MG PO CHEW
81.0000 mg | CHEWABLE_TABLET | Freq: Every day | ORAL | Status: DC
  Administered 2018-09-27: 81 mg via ORAL
  Filled 2018-09-26: qty 1

## 2018-09-26 MED ORDER — PANTOPRAZOLE SODIUM 40 MG PO TBEC
40.0000 mg | DELAYED_RELEASE_TABLET | Freq: Every day | ORAL | Status: DC
  Administered 2018-09-27: 40 mg via ORAL
  Filled 2018-09-26: qty 1

## 2018-09-26 MED ORDER — ONDANSETRON HCL 4 MG/2ML IJ SOLN: 4.0000 mg | Freq: Four times a day (QID) | INTRAMUSCULAR | Status: DC | PRN

## 2018-09-26 MED ORDER — IBUPROFEN 400 MG PO TABS: 600.0000 mg | ORAL_TABLET | Freq: Three times a day (TID) | ORAL | Status: DC | PRN

## 2018-09-26 MED ORDER — VENLAFAXINE HCL ER 75 MG PO CP24
75.0000 mg | ORAL_CAPSULE | Freq: Every day | ORAL | Status: DC
  Administered 2018-09-27: 75 mg via ORAL
  Filled 2018-09-26: qty 1

## 2018-09-26 MED ORDER — MORPHINE SULFATE (PF) 4 MG/ML IV SOLN
4.0000 mg | Freq: Once | INTRAVENOUS | Status: AC
  Administered 2018-09-26: 4 mg via INTRAVENOUS
  Filled 2018-09-26: qty 1

## 2018-09-26 MED ORDER — SIMVASTATIN 20 MG PO TABS
20.0000 mg | ORAL_TABLET | Freq: Every day | ORAL | Status: DC
  Administered 2018-09-27: 20 mg via ORAL
  Filled 2018-09-26: qty 1

## 2018-09-26 MED ORDER — BISACODYL 5 MG PO TBEC: 5.0000 mg | DELAYED_RELEASE_TABLET | Freq: Every day | ORAL | Status: DC | PRN

## 2018-09-26 MED ORDER — ACETAMINOPHEN 650 MG RE SUPP: 650.0000 mg | Freq: Four times a day (QID) | RECTAL | Status: DC | PRN

## 2018-09-26 MED ORDER — ADULT MULTIVITAMIN W/MINERALS CH
1.0000 | ORAL_TABLET | Freq: Every day | ORAL | Status: DC
  Administered 2018-09-27: 1 via ORAL
  Filled 2018-09-26: qty 1

## 2018-09-26 MED ORDER — OXYCODONE HCL 5 MG PO TABS
5.0000 mg | ORAL_TABLET | Freq: Four times a day (QID) | ORAL | Status: DC | PRN
  Administered 2018-09-26 – 2018-09-27 (×2): 5 mg via ORAL
  Filled 2018-09-26 (×2): qty 1

## 2018-09-26 MED ORDER — VITAMIN A 10000 UNITS PO CAPS
10000.0000 [IU] | ORAL_CAPSULE | Freq: Every day | ORAL | Status: DC
  Filled 2018-09-26 (×3): qty 1

## 2018-09-26 MED ORDER — MORPHINE SULFATE (PF) 2 MG/ML IV SOLN
2.0000 mg | INTRAVENOUS | Status: DC | PRN
  Administered 2018-09-26 – 2018-09-27 (×2): 2 mg via INTRAVENOUS
  Filled 2018-09-26 (×2): qty 1

## 2018-09-26 MED ORDER — ASPIRIN 81 MG PO CHEW
324.0000 mg | CHEWABLE_TABLET | Freq: Once | ORAL | Status: AC
  Administered 2018-09-26: 324 mg via ORAL
  Filled 2018-09-26: qty 4

## 2018-09-26 MED ORDER — VITAMIN D3 25 MCG (1000 UNIT) PO TABS
1000.0000 [IU] | ORAL_TABLET | Freq: Every day | ORAL | Status: DC
  Administered 2018-09-27: 1000 [IU] via ORAL
  Filled 2018-09-26: qty 1

## 2018-09-26 MED ORDER — BUSPIRONE HCL 10 MG PO TABS
10.0000 mg | ORAL_TABLET | Freq: Every day | ORAL | Status: DC
  Administered 2018-09-27: 10 mg via ORAL
  Filled 2018-09-26: qty 1

## 2018-09-26 MED ORDER — ENOXAPARIN SODIUM 40 MG/0.4ML ~~LOC~~ SOLN
40.0000 mg | SUBCUTANEOUS | Status: DC
  Administered 2018-09-26: 40 mg via SUBCUTANEOUS
  Filled 2018-09-26: qty 0.4

## 2018-09-26 NOTE — ED Notes (Signed)
Patient reports pain to right side upper back. Reports recent back surgery. No obvious hematomas noted. bandaide to upper back removes and area assessed. No drainage noted area CDI. No sutures noted to surgical area. Patient reports falling out of bed 3 days ago. xrays completed IV hep lock placed to right wrist. Labs drawn and sent. Safety maintaned. Call light w/i reach. S/O at bedside.

## 2018-09-26 NOTE — ED Notes (Signed)
Report to Amy receiving nurse.

## 2018-09-26 NOTE — Progress Notes (Signed)
Family Meeting Note  Advance Directive:no  Today a meeting took place with the Patient.spouse  The following clinical team members were present during this meeting:MD  The following were discussed:Patient's diagnosis:elevated troponin , Patient's progosis: > 12 months and Goals for treatment: Full Code  Additional follow-up to be provided: FUL CODE Chaplain consult for advanced directives INFO  Time spent during discussion:16 minutes  Pamela Brecht, MD

## 2018-09-26 NOTE — H&P (Signed)
[facility] [place] NAME: Pamela Ball    MR#:  [id number]  DATE OF BIRTH:  May 01, 1946  DATE OF ADMISSION:  09/26/2018  PRIMARY CARE PHYSICIAN: Olin Hauser, DO   REQUESTING/REFERRING PHYSICIAN: dr Jimmye Norman  CHIEF COMPLAINT:   fall HISTORY OF PRESENT ILLNESS:  Pamela Ball  is a 72 y.o. female with a known history of recent kyphoplasty and bipolar who presented to the ER after mechanical fall.  Patient was sleeping in her bed and rolled off her bed and fell to the ground.  She she presents to the ER due to back pain.  She denies numbness or tingling down her leg.  She denies bowel or bladder incontinence.  Her kyphoplasty was 4 days ago.  She has been at home and slowly ambulating since the surgery.  She denies chest pain, shortness of breath, nausea or vomiting.  When she presented to the ER her heart rate was in the upper 100s however she denies palpitations.  She has no heart history however it is noted on today's labs that her troponin is 0.65.  She has no acute ST elevation or ST depression on EKG.  She continues to deny chest pain or shortness of breath.  Hospitalist was consulted due to elevated troponin.  ER physician has spoken with the cardiologist Dr. Rockey Situ who is recommending to continue to trend troponins.  PAST MEDICAL HISTORY:   Past Medical History:  Diagnosis Date  . Abdominal hernia with obstruction and without gangrene   . Anxiety 02/15/2017  . Bipolar 1 disorder, depressed, full remission ([place]) 02/15/2017  . Bipolar affective disorder ([place])   . COPD (chronic obstructive pulmonary disease) ([place]) 02/15/2017  . Depression   . Dyspnea   . GERD (gastroesophageal reflux disease) 02/15/2017  . Glaucoma   . Hiatal hernia   . History of abdominal hernia 05/21/2017  . History of compression fracture of spine 02/15/2017  . Hyperlipidemia   . Incisional hernia   . Incisional ventral hernia w obstruction 06/02/2017  .  Normocytic anemia 05/23/2017  . 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 Presbycusis of both ears 02/15/2017    PAST SURGICAL HISTORY:   Past Surgical History:  Procedure Laterality Date  . ABDOMINAL HYSTERECTOMY  11/06/2006  . CESAREAN SECTION     x3  . COLONOSCOPY WITH PROPOFOL N/A 06/25/2018   Procedure: COLONOSCOPY WITH PROPOFOL;  Surgeon: Jonathon Bellows, MD;  Location: [facility] ENDOSCOPY;  Service: Endoscopy;  Laterality: N/A;  . KYPHOPLASTY    . KYPHOPLASTY N/A 09/23/2018   Procedure: KYPHOPLASTY T10;  Surgeon: Hessie Knows, MD;  Location: [facility] ORS;  Service: Orthopedics;  Laterality: N/A;  . VENTRAL HERNIA REPAIR N/A 06/02/2017   Procedure: exploratory laparotomy repair of incarcerated  VENTRAL hernia;  Surgeon: Florene Glen, MD;  Location: [facility] ORS;  Service: General;  Laterality: N/A;  . VENTRAL HERNIA REPAIR N/A 08/29/2018   Procedure: LAPAROSCOPIC INCISIONAL HERNIA;  Surgeon: Olean Ree, MD;  Location: [facility] ORS;  Service: General;  Laterality: N/A;    SOCIAL HISTORY:   Social History   Tobacco Use  . Smoking status: Never Smoker  . Smokeless tobacco: Never Used  Substance Use Topics  . Alcohol use: No    FAMILY HISTORY:   Family History  Problem Relation Age of Onset  . Cancer Mother        cancer  . Breast cancer Mother 48    DRUG ALLERGIES:  No Known Allergies  REVIEW OF  SYSTEMS:   Review of Systems  Constitutional: Negative.  Negative for chills, fever and malaise/fatigue.  HENT: Negative.  Negative for ear discharge, ear pain, hearing loss, nosebleeds and sore throat.   Eyes: Negative.  Negative for blurred vision and pain.  Respiratory: Negative.  Negative for cough, hemoptysis, shortness of breath and wheezing.   Cardiovascular: Negative.  Negative for chest pain, palpitations and leg swelling.  Gastrointestinal: Negative.  Negative for abdominal pain, blood in stool, diarrhea, nausea and vomiting.  Genitourinary: Negative.  Negative for  dysuria.  Musculoskeletal: Positive for back pain.  Skin: Negative.   Neurological: Negative for dizziness, tremors, speech change, focal weakness, seizures and headaches.  Endo/Heme/Allergies: Negative.  Does not bruise/bleed easily.  Psychiatric/Behavioral: Negative.  Negative for depression, hallucinations and suicidal ideas.    MEDICATIONS AT HOME:   Prior to Admission medications   Medication Sig Start Date End Date Taking? Authorizing Provider  buPROPion (WELLBUTRIN XL) 150 MG 24 hr tablet Take 150 mg by mouth daily.  01/11/15  Yes [provider]  busPIRone (BUSPAR) 10 MG tablet Take 1 tablet (10 mg total) by mouth daily. Prescribed by Psychiatry Dr Kasandra Knudsen 11/14/17  Yes Karamalegos, Devonne Doughty, DO  cholecalciferol (VITAMIN D) 1000 units tablet Take 1,000 Units by mouth daily.   Yes [provider]  diclofenac sodium (VOLTAREN) 1 % GEL Apply 4 g topically 4 (four) times daily. 09/19/18  Yes Triplett, Cari B, FNP  ibuprofen (ADVIL,MOTRIN) 600 MG tablet Take 1 tablet (600 mg total) by mouth every 8 (eight) hours as needed for fever, mild pain or moderate pain. 08/29/18  Yes Piscoya, Jacqulyn Bath, MD  Multiple Vitamin (MULTI-VITAMINS) TABS Take 1 tablet by mouth daily.    Yes [provider]  OLANZapine (ZYPREXA) 5 MG tablet Take 5 mg by mouth at bedtime.  01/11/15  Yes [provider]  omeprazole (PRILOSEC) 20 MG capsule Take 1 capsule (20 mg total) by mouth daily before breakfast. 05/14/18  Yes Karamalegos, Devonne Doughty, DO  oxyCODONE (ROXICODONE) 5 MG immediate release tablet Take 1 tablet (5 mg total) by mouth every 6 (six) hours as needed for moderate pain. 09/23/18 09/23/19 Yes Hessie Knows, MD  simvastatin (ZOCOR) 20 MG tablet Take 1 tablet (20 mg total) by mouth daily. 05/14/18  Yes Karamalegos, Devonne Doughty, DO  venlafaxine XR (EFFEXOR-XR) 75 MG 24 hr capsule Take 75 mg by mouth daily with breakfast.  01/11/15  Yes [provider]  vitamin A 10000 UNIT capsule  Take 10,000 Units by mouth daily.   Yes [provider]  vitamin C (ASCORBIC ACID) 500 MG tablet Take 500 mg by mouth daily.   Yes [provider]  oxyCODONE (OXY IR/ROXICODONE) 5 MG immediate release tablet Take 1 tablet (5 mg total) by mouth every 4 (four) hours as needed for severe pain. Patient not taking: Reported on 09/26/2018 08/29/18   Olean Ree, MD      VITAL SIGNS:  Blood pressure (!) 166/94, pulse (!) 118, temperature (!) 97.4 F (36.3 C), temperature source Oral, resp. rate 18, height 5' (1.524 m), weight 59 kg, SpO2 96 %.  PHYSICAL EXAMINATION:   Physical Exam  Constitutional: She is oriented to person, place, and time. No distress.  HENT:  Head: Normocephalic.  Eyes: No scleral icterus.  Neck: Normal range of motion. Neck supple. No JVD present. No tracheal deviation present.  Cardiovascular: Normal rate, regular rhythm and normal heart sounds. Exam reveals no gallop and no friction rub.  No murmur heard. Pulmonary/Chest:  Effort normal and breath sounds normal. No respiratory distress. She has no wheezes. She has no rales. She exhibits no tenderness.  Abdominal: Soft. Bowel sounds are normal. She exhibits no distension and no mass. There is no tenderness. There is no rebound and no guarding.  Musculoskeletal: Normal range of motion. She exhibits no edema.  Neurological: She is alert and oriented to person, place, and time.  Skin: Skin is warm. No rash noted. No erythema.  Psychiatric: Judgment normal.      LABORATORY PANEL:   CBC Recent Labs  Lab 09/26/18 0852  WBC 9.3  HGB 14.7  HCT 44.7  PLT 215   ------------------------------------------------------------------------------------------------------------------  Chemistries  Recent Labs  Lab 09/26/18 0852  NA 138  K 3.4*  CL 97*  CO2 28  GLUCOSE 147*  BUN 12  CREATININE 0.38*  CALCIUM 8.7*  AST 33  ALT 21  ALKPHOS 148*  BILITOT 1.3*    ------------------------------------------------------------------------------------------------------------------  Cardiac Enzymes Recent Labs  Lab 09/26/18 0852  TROPONINI 0.65*   ------------------------------------------------------------------------------------------------------------------  RADIOLOGY:  Dg Chest 1 View  Result Date: 09/26/2018 CLINICAL DATA:  Fall.  Generalized chest and back pain. EXAM: CHEST  1 VIEW COMPARISON:  01/24/2015. FINDINGS: Mediastinum and hilar structures normal. Heart size normal. No pulmonary venous congestion. Mild bibasilar atelectasis/infiltrates. Small bilateral pleural effusions. No pneumothorax. Deformity noted the proximal right humerus consistent with old healed fracture. 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 Prior thoracic and lumbar vertebroplasties. Thoracolumbar spine scoliosis. IMPRESSION: Mild bibasilar atelectasis/infiltrates and small pleural effusions. Electronically Signed   By: Marcello Moores  Register   On: 09/26/2018 10:08   Dg Thoracic Spine 2 View  Result Date: 09/26/2018 CLINICAL DATA:  Mid back pain. Recent T10 surgery. Fall out of bed. EXAM: THORACIC SPINE 2 VIEWS COMPARISON:  CT 09/19/2018. FINDINGS: Changes of vertebral augmentation noted at T10, L1 and L2. Kyphotic deformity at L1, stable. No new fracture. IMPRESSION: Vertebral augmentation changes as above.  No acute bony abnormality. Electronically Signed   By: Rolm Baptise M.D.   On: 09/26/2018 08:51   Dg Lumbar Spine 2-3 Views  Result Date: 09/26/2018 CLINICAL DATA:  Mid back pain EXAM: LUMBAR SPINE - 2-3 VIEW COMPARISON:  09/19/2018 FINDINGS: Interval changes of vertebral augmentation at T10. Remote changes at L1 and L2. Kyphotic deformity at L1, stable. No acute fracture. IMPRESSION: Interval T10 vertebral augmentation.  No acute bony abnormality. Electronically Signed   By: Rolm Baptise M.D.   On: 09/26/2018 08:49    EKG:   Sinus  tachycardia heart rate 107 no ST elevation or depression  IMPRESSION AND PLAN:   73 year old female postoperative day #4 kyphoplasty and bipolar who presents after mechanical fall and found to have elevation in troponin.  1.  Elevated troponin without EKG changes or chest pain Continue to monitor troponins Echocardiogram Dr. Rockey Situ to see patient and consult placed via epic Continue telemetry Aspirin 81 mg daily Check A1c and lipid panel  2.  Back pain status post recent kyphoplasty: Consult placed for Dr. Rudene Christians via epic PT consult Pain control  3.  Bipolar affective disorder: Continue Wellbutrin, BuSpar, Zyprexa, Effexor  4.  Hyperlipidemia: Continue statin   All the records are reviewed and case discussed with ED provider. Management plans discussed with the patient and she is in agreement  CODE STATUS: full  TOTAL TIME TAKING CARE OF THIS PATIENT: 48 minutes.    Fleming Prill M.D on 09/26/2018 at 10:32 AM  Between 7am to 6pm -  Pager - [phone]  After 6pm go to [email or web address] - password [email or web address] [facility]  Office  [phone]  CC: Primary care physician; Olin Hauser, DO

## 2018-09-26 NOTE — Progress Notes (Signed)
CRITICAL VALUE ALERT  Critical Value:  Troponin 0.89  Date & Time Notied:  09/26/2018. 1:37 PM   Provider Notified:  Dr. Benjie Karvonen  Orders Received/Actions taken: awaiting callback

## 2018-09-26 NOTE — Consult Note (Signed)
Reason for consult: Back pain Patient had recent kyphoplasty at T10 with partial correction of deformity.  She suffered a fall on Tuesday and was having significant pain, was supposed to come to the office today but came to the emergency room because of increasingly severe pain.  On exam she is tender in the lumbar spine around L3 and 4.  She has no clonus negative straight leg raising and intact sensation to the legs  X-rays of the lumbar and thoracic spine did not reveal any new fracture  Impression is severe low back pain in a patient with history of significant osteoporosis and multiple prior compression fractures.  Recommendation is for MRI lumbar spine without contrast which is been ordered.  Course her medical condition may not allow for any treatment other than nonoperative with her elevated troponin.  We will continue to follow her with you.

## 2018-09-26 NOTE — ED Provider Notes (Signed)
[facility] Emergency Department Provider Note       Time seen: ----------------------------------------- 8:03 AM on 09/26/2018 -----------------------------------------   I have reviewed the triage vital signs and the nursing notes.  HISTORY   Chief Complaint Back Pain    HPI Pamela Ball is a 72 y.o. female with a history of anxiety, bipolar disorder, COPD, depression, GERD, hyperlipidemia who presents to the ED for back pain.  She states she fell out of bed she is complaining of low back pain.  She is ambulatory, denies any radiating pain.  Last dose pain medicine was 430 this morning according to a family member.  She had T10 kyphoplasty 4 days ago.  She is not having significant pain in the upper back, rather in the lower back.  Past Medical History:  Diagnosis Date  . Abdominal hernia with obstruction and without gangrene   . Anxiety 02/15/2017  . Bipolar 1 disorder, depressed, full remission ([place]) 02/15/2017  . Bipolar affective disorder ([place])   . COPD (chronic obstructive pulmonary disease) ([place]) 02/15/2017  . Depression   . Dyspnea   . GERD (gastroesophageal reflux disease) 02/15/2017  . Glaucoma   . Hiatal hernia   . History of abdominal hernia 05/21/2017  . History of compression fracture of spine 02/15/2017  . Hyperlipidemia   . Incisional hernia   . Incisional ventral hernia w obstruction 06/02/2017  . Normocytic anemia 05/23/2017  . 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 Presbycusis of both ears 02/15/2017    Patient Active Problem List   Diagnosis Date Noted  . Incisional hernia, without obstruction or gangrene 08/28/2018  . Allergic rhinitis due to allergen 05/14/2018  . Abdominal hernia with obstruction and without gangrene   . Normocytic anemia 05/23/2017  . History of abdominal hernia 05/21/2017  . Bipolar 1 disorder, depressed, full remission ([place]) 02/15/2017  . GERD (gastroesophageal reflux disease) 02/15/2017  .  COPD (chronic obstructive pulmonary disease) ([place]) 02/15/2017  . 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 Hyperlipidemia 02/15/2017  . Presbycusis of both ears 02/15/2017  . History of compression fracture of spine 02/15/2017  . Anxiety 02/15/2017    Past Surgical History:  Procedure Laterality Date  . ABDOMINAL HYSTERECTOMY  11/06/2006  . CESAREAN SECTION     x3  . COLONOSCOPY WITH PROPOFOL N/A 06/25/2018   Procedure: COLONOSCOPY WITH PROPOFOL;  Surgeon: Jonathon Bellows, MD;  Location: [facility] ENDOSCOPY;  Service: Endoscopy;  Laterality: N/A;  . KYPHOPLASTY    . KYPHOPLASTY N/A 09/23/2018   Procedure: KYPHOPLASTY T10;  Surgeon: Hessie Knows, MD;  Location: [facility] ORS;  Service: Orthopedics;  Laterality: N/A;  . VENTRAL HERNIA REPAIR N/A 06/02/2017   Procedure: exploratory laparotomy repair of incarcerated  VENTRAL hernia;  Surgeon: Florene Glen, MD;  Location: [facility] ORS;  Service: General;  Laterality: N/A;  . VENTRAL HERNIA REPAIR N/A 08/29/2018   Procedure: LAPAROSCOPIC INCISIONAL HERNIA;  Surgeon: Olean Ree, MD;  Location: [facility] ORS;  Service: General;  Laterality: N/A;    Allergies Patient has no known allergies.  Social History Social History   Tobacco Use  . Smoking status: Never Smoker  . Smokeless tobacco: Never Used  Substance Use Topics  . Alcohol use: No  . Drug use: No   Review of Systems Constitutional: Negative for fever. Cardiovascular: Negative for chest pain. Respiratory: Negative for shortness of breath. Gastrointestinal: Negative for abdominal pain, vomiting and diarrhea. Musculoskeletal: Positive for back pain Skin: Negative for rash. Neurological: Negative for headaches, focal weakness or  numbness.  All systems negative/normal/unremarkable except as stated in the HPI  ____________________________________________   PHYSICAL EXAM:  VITAL SIGNS: ED Triage Vitals  Enc Vitals Group     BP 09/26/18 0727 (!) 166/94     Pulse Rate 09/26/18  0727 (!) 118     Resp 09/26/18 0727 18     Temp 09/26/18 0727 (!) 97.4 F (36.3 C)     Temp Source 09/26/18 0727 Oral     SpO2 09/26/18 0727 94 %     Weight 09/26/18 0728 130 lb (59 kg)     Height 09/26/18 0728 5' (1.524 m)     Head Circumference --      Peak Flow --      Pain Score 09/26/18 0727 10     Pain Loc --      Pain Edu? --      Excl. in [place]? --    Constitutional: No acute distress ENT   Head: Normocephalic and atraumatic.   Nose: No congestion/rhinnorhea.   Mouth/Throat: Mucous membranes are moist.   Neck: No stridor. Cardiovascular: Normal rate, regular rhythm. No murmurs, rubs, or gallops. Respiratory: Normal respiratory effort without tachypnea nor retractions. Breath sounds are clear and equal bilaterally. No wheezes/rales/rhonchi. Musculoskeletal: Nontender with normal range of motion in extremities.  Tenderness along the mid and lower back.  Previous kyphoplasty site is clean dry and intact and nontender Neurologic:  Normal speech and language. No gross focal neurologic deficits are appreciated.  Skin: Kyphoplasty site noted in the paraspinous region in the right thoracic spine Psychiatric: Mood and affect are normal. Speech and behavior are normal.  ____________________________________________  EKG: Interpreted by me.  Sinus tachycardia with a rate of 107 bpm, right bundle branch block, nonspecific ST changes are noted  ____________________________________________  ED COURSE:  As part of my medical decision making, I reviewed the following data within the [place] History obtained from family if available, nursing notes, old chart and ekg, as well as notes from prior ED visits. Patient presented for a fall with back pain, we will assess with labs and imaging as indicated at this time.   Procedures ____________________________________________   LABS (pertinent positives/negatives)  Labs Reviewed  CBC WITH DIFFERENTIAL/PLATELET  - Abnormal; Notable for the following components:      Result Value   Monocytes Absolute 1.8 (*)    Abs Immature Granulocytes 0.14 (*)    All other components within normal limits  COMPREHENSIVE METABOLIC PANEL - Abnormal; Notable for the following components:   Potassium 3.4 (*)    Chloride 97 (*)    Glucose, Bld 147 (*)    Creatinine, Ser 0.38 (*)    Calcium 8.7 (*)    Albumin 3.2 (*)    Alkaline Phosphatase 148 (*)    Total Bilirubin 1.3 (*)    All other components within normal limits  TROPONIN I - Abnormal; Notable for the following components:   Troponin I 0.65 (*)    All other components within normal limits  URINALYSIS, COMPLETE (UACMP) WITH MICROSCOPIC   CRITICAL CARE Performed by: Laurence Aly   Total critical care time: 30 minutes  Critical care time was exclusive of separately billable procedures and treating other patients.  Critical care was necessary to treat or prevent imminent or life-threatening deterioration.  Critical care was time spent personally by me on the following activities: development of treatment plan with patient and/or surrogate as well as nursing, discussions with consultants, evaluation of patient's response  to treatment, examination of patient, obtaining history from patient or surrogate, ordering and performing treatments and interventions, ordering and review of laboratory studies, ordering and review of radiographic studies, pulse oximetry and re-evaluation of patient's condition.  RADIOLOGY Images were viewed by me  Thoracic spine x-rays, lumbar spine x-rays IMPRESSION: Interval T10 vertebral augmentation.  No acute bony abnormality. ____________________________________________  DIFFERENTIAL DIAGNOSIS   Fall, contusion, fracture,  FINAL ASSESSMENT AND PLAN  Fall, back pain, NSTEMI   Plan: The patient had presented for back pain from a fall 2 days ago after recent kyphoplasty. Patient's labs surprisingly indicated non-ST  elevation MI with a troponin of 0.65. Patient's imaging did not reveal any acute process.  Patient will be placed on aspirin, unsure if this is demand ischemia or silent MI.  I will discuss with cardiology and potentially place her on heparin.   Laurence Aly, MD   Note: This note was generated in part or whole with voice recognition software. Voice recognition is usually quite accurate but there are transcription errors that can and very often do occur. I apologize for any typographical errors that were not detected and corrected.     Earleen Newport, MD 09/26/18 [phone]

## 2018-09-26 NOTE — ED Notes (Signed)
First Nurse Note:  Patient complaining of mid-back pain.  Husband states patient had surgery on Monday - Dr. Vinetta Bergamo and fell out of bed on Tuesday AM.  Alert, ambulatory without assistance.

## 2018-09-26 NOTE — Progress Notes (Signed)
[facility] NAME: Pamela Ball    MR#:  [id number]  DATE OF BIRTH:  [birth year]  DATE OF ADMISSION:  09/26/2018  PRIMARY CARE PHYSICIAN: Olin Hauser, DO   REQUESTING/REFERRING PHYSICIAN: dr Jimmye Norman  CHIEF COMPLAINT:   fall HISTORY OF PRESENT ILLNESS:  Pamela Ball  is a 72 y.o. female with a known history of recent kyphoplasty and bipolar who presented to the ER after mechanical fall.  Patient was sleeping in her bed and rolled off her bed and fell to the ground.  She she presents to the ER due to back pain.  She denies numbness or tingling down her leg.  She denies bowel or bladder incontinence.  Her kyphoplasty was 4 days ago.  She has been at home and slowly ambulating since the surgery.  She denies chest pain, shortness of breath, nausea or vomiting.  When she presented to the ER her heart rate was in the upper 100s however she denies palpitations.  She has no heart history however it is noted on today's labs that her troponin is 0.65.  She has no acute ST elevation or ST depression on EKG.  She continues to deny chest pain or shortness of breath.  Hospitalist was consulted due to elevated troponin.  ER physician has spoken with the cardiologist Dr. Rockey Situ who is recommending to continue to trend troponins.  PAST MEDICAL HISTORY:   Past Medical History:  Diagnosis Date  . Abdominal hernia with obstruction and without gangrene   . Anxiety 02/15/2017  . Bipolar 1 disorder, depressed, full remission ([place]) 02/15/2017  . Bipolar affective disorder ([place])   . COPD (chronic obstructive pulmonary disease) ([place]) 02/15/2017  . Depression   . Dyspnea   . GERD (gastroesophageal reflux disease) 02/15/2017  . Glaucoma   . Hiatal hernia   . History of abdominal hernia 05/21/2017  . History of compression fracture of spine 02/15/2017  . Hyperlipidemia   . Incisional hernia   . Incisional ventral hernia w obstruction 06/02/2017  .  Normocytic anemia 05/23/2017  . 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 Presbycusis of both ears 02/15/2017    PAST SURGICAL HISTORY:   Past Surgical History:  Procedure Laterality Date  . ABDOMINAL HYSTERECTOMY  11/06/2006  . CESAREAN SECTION     x3  . COLONOSCOPY WITH PROPOFOL N/A 06/25/2018   Procedure: COLONOSCOPY WITH PROPOFOL;  Surgeon: Jonathon Bellows, MD;  Location: [facility] ENDOSCOPY;  Service: Endoscopy;  Laterality: N/A;  . KYPHOPLASTY    . KYPHOPLASTY N/A 09/23/2018   Procedure: KYPHOPLASTY T10;  Surgeon: Hessie Knows, MD;  Location: [facility] ORS;  Service: Orthopedics;  Laterality: N/A;  . VENTRAL HERNIA REPAIR N/A 06/02/2017   Procedure: exploratory laparotomy repair of incarcerated  VENTRAL hernia;  Surgeon: Florene Glen, MD;  Location: [facility] ORS;  Service: General;  Laterality: N/A;  . VENTRAL HERNIA REPAIR N/A 08/29/2018   Procedure: LAPAROSCOPIC INCISIONAL HERNIA;  Surgeon: Olean Ree, MD;  Location: [facility] ORS;  Service: General;  Laterality: N/A;    SOCIAL HISTORY:   Social History   Tobacco Use  . Smoking status: Never Smoker  . Smokeless tobacco: Never Used  Substance Use Topics  . Alcohol use: No    FAMILY HISTORY:   Family History  Problem Relation Age of Onset  . Cancer Mother        cancer  . Breast cancer Mother 66    DRUG ALLERGIES:  No Known Allergies  REVIEW OF  SYSTEMS:   Review of Systems  Constitutional: Negative.  Negative for chills, fever and malaise/fatigue.  HENT: Negative.  Negative for ear discharge, ear pain, hearing loss, nosebleeds and sore throat.   Eyes: Negative.  Negative for blurred vision and pain.  Respiratory: Negative.  Negative for cough, hemoptysis, shortness of breath and wheezing.   Cardiovascular: Negative.  Negative for chest pain, palpitations and leg swelling.  Gastrointestinal: Negative.  Negative for abdominal pain, blood in stool, diarrhea, nausea and vomiting.  Genitourinary: Negative.  Negative for  dysuria.  Musculoskeletal: Positive for back pain.  Skin: Negative.   Neurological: Negative for dizziness, tremors, speech change, focal weakness, seizures and headaches.  Endo/Heme/Allergies: Negative.  Does not bruise/bleed easily.  Psychiatric/Behavioral: Negative.  Negative for depression, hallucinations and suicidal ideas.    MEDICATIONS AT HOME:   Prior to Admission medications   Medication Sig Start Date End Date Taking? Authorizing Provider  buPROPion (WELLBUTRIN XL) 150 MG 24 hr tablet Take 150 mg by mouth daily.  01/11/15  Yes [provider]  busPIRone (BUSPAR) 10 MG tablet Take 1 tablet (10 mg total) by mouth daily. Prescribed by Psychiatry Dr Kasandra Knudsen 11/14/17  Yes Karamalegos, Devonne Doughty, DO  cholecalciferol (VITAMIN D) 1000 units tablet Take 1,000 Units by mouth daily.   Yes [provider]  diclofenac sodium (VOLTAREN) 1 % GEL Apply 4 g topically 4 (four) times daily. 09/19/18  Yes Triplett, Cari B, FNP  ibuprofen (ADVIL,MOTRIN) 600 MG tablet Take 1 tablet (600 mg total) by mouth every 8 (eight) hours as needed for fever, mild pain or moderate pain. 08/29/18  Yes Piscoya, Jacqulyn Bath, MD  Multiple Vitamin (MULTI-VITAMINS) TABS Take 1 tablet by mouth daily.    Yes [provider]  OLANZapine (ZYPREXA) 5 MG tablet Take 5 mg by mouth at bedtime.  01/11/15  Yes [provider]  omeprazole (PRILOSEC) 20 MG capsule Take 1 capsule (20 mg total) by mouth daily before breakfast. 05/14/18  Yes Karamalegos, Devonne Doughty, DO  oxyCODONE (ROXICODONE) 5 MG immediate release tablet Take 1 tablet (5 mg total) by mouth every 6 (six) hours as needed for moderate pain. 09/23/18 09/23/19 Yes Hessie Knows, MD  simvastatin (ZOCOR) 20 MG tablet Take 1 tablet (20 mg total) by mouth daily. 05/14/18  Yes Karamalegos, Devonne Doughty, DO  venlafaxine XR (EFFEXOR-XR) 75 MG 24 hr capsule Take 75 mg by mouth daily with breakfast.  01/11/15  Yes [provider]  vitamin A 10000 UNIT capsule  Take 10,000 Units by mouth daily.   Yes [provider]  vitamin C (ASCORBIC ACID) 500 MG tablet Take 500 mg by mouth daily.   Yes [provider]  oxyCODONE (OXY IR/ROXICODONE) 5 MG immediate release tablet Take 1 tablet (5 mg total) by mouth every 4 (four) hours as needed for severe pain. Patient not taking: Reported on 09/26/2018 08/29/18   Olean Ree, MD      VITAL SIGNS:  Blood pressure (!) 166/94, pulse (!) 118, temperature (!) 97.4 F (36.3 C), temperature source Oral, resp. rate 18, height 5' (1.524 m), weight 59 kg, SpO2 96 %.  PHYSICAL EXAMINATION:   Physical Exam  Constitutional: She is oriented to person, place, and time. No distress.  HENT:  Head: Normocephalic.  Eyes: No scleral icterus.  Neck: Normal range of motion. Neck supple. No JVD present. No tracheal deviation present.  Cardiovascular: Normal rate, regular rhythm and normal heart sounds. Exam reveals no gallop and no friction rub.  No murmur heard. Pulmonary/Chest:  Effort normal and breath sounds normal. No respiratory distress. She has no wheezes. She has no rales. She exhibits no tenderness.  Abdominal: Soft. Bowel sounds are normal. She exhibits no distension and no mass. There is no tenderness. There is no rebound and no guarding.  Musculoskeletal: Normal range of motion. She exhibits no edema.  Neurological: She is alert and oriented to person, place, and time.  Skin: Skin is warm. No rash noted. No erythema.  Psychiatric: Judgment normal.      LABORATORY PANEL:   CBC Recent Labs  Lab 09/26/18 0852  WBC 9.3  HGB 14.7  HCT 44.7  PLT 215   ------------------------------------------------------------------------------------------------------------------  Chemistries  Recent Labs  Lab 09/26/18 0852  NA 138  K 3.4*  CL 97*  CO2 28  GLUCOSE 147*  BUN 12  CREATININE 0.38*  CALCIUM 8.7*  AST 33  ALT 21  ALKPHOS 148*  BILITOT 1.3*    ------------------------------------------------------------------------------------------------------------------  Cardiac Enzymes Recent Labs  Lab 09/26/18 0852  TROPONINI 0.65*   ------------------------------------------------------------------------------------------------------------------  RADIOLOGY:  Dg Chest 1 View  Result Date: 09/26/2018 CLINICAL DATA:  Fall.  Generalized chest and back pain. EXAM: CHEST  1 VIEW COMPARISON:  01/24/2015. FINDINGS: Mediastinum and hilar structures normal. Heart size normal. No pulmonary venous congestion. Mild bibasilar atelectasis/infiltrates. Small bilateral pleural effusions. No pneumothorax. Deformity noted the proximal right humerus consistent with old healed fracture. 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 Prior thoracic and lumbar vertebroplasties. Thoracolumbar spine scoliosis. IMPRESSION: Mild bibasilar atelectasis/infiltrates and small pleural effusions. Electronically Signed   By: Marcello Moores  Register   On: 09/26/2018 10:08   Dg Thoracic Spine 2 View  Result Date: 09/26/2018 CLINICAL DATA:  Mid back pain. Recent T10 surgery. Fall out of bed. EXAM: THORACIC SPINE 2 VIEWS COMPARISON:  CT 09/19/2018. FINDINGS: Changes of vertebral augmentation noted at T10, L1 and L2. Kyphotic deformity at L1, stable. No new fracture. IMPRESSION: Vertebral augmentation changes as above.  No acute bony abnormality. Electronically Signed   By: Rolm Baptise M.D.   On: 09/26/2018 08:51   Dg Lumbar Spine 2-3 Views  Result Date: 09/26/2018 CLINICAL DATA:  Mid back pain EXAM: LUMBAR SPINE - 2-3 VIEW COMPARISON:  09/19/2018 FINDINGS: Interval changes of vertebral augmentation at T10. Remote changes at L1 and L2. Kyphotic deformity at L1, stable. No acute fracture. IMPRESSION: Interval T10 vertebral augmentation.  No acute bony abnormality. Electronically Signed   By: Rolm Baptise M.D.   On: 09/26/2018 08:49    EKG:   Sinus  tachycardia heart rate 107 no ST elevation or depression  IMPRESSION AND PLAN:   72 year old female postoperative day #4 kyphoplasty and bipolar who presents after mechanical fall and found to have elevation in troponin.  1.  Elevated troponin without EKG changes or chest pain Continue to monitor troponins Echocardiogram Dr. Rockey Situ to see patient and consult placed via epic Continue telemetry Aspirin 81 mg daily Check A1c and lipid panel  2.  Back pain status post recent kyphoplasty: Consult placed for Dr. Rudene Christians via epic PT consult Pain control  3.  Bipolar affective disorder: Continue Wellbutrin, BuSpar, Zyprexa, Effexor  4.  Hyperlipidemia: Continue statin   All the records are reviewed and case discussed with ED provider. Management plans discussed with the patient and she is in agreement  CODE STATUS: full  TOTAL TIME TAKING CARE OF THIS PATIENT: 48 minutes.    Shailene Demonbreun M.D on 09/26/2018 at 10:32 AM  Between 7am to 6pm -  Pager - [phone]  After 6pm go to [email or web address] - password [email or web address]  Office  [phone]  CC: Primary care physician; Olin Hauser, DO

## 2018-09-26 NOTE — Progress Notes (Signed)
Chaplain responded to an OR for an AD. Chaplain educated Pt and spouse on AD. Pt was not alert to education. Family will look over and get back.    09/26/18 1100  Clinical Encounter Type  Visited With Patient and family together  Visit Type Initial  Referral From Physician  Spiritual Encounters  Spiritual Needs Brochure

## 2018-09-26 NOTE — ED Triage Notes (Addendum)
Family member states pt had surgery on back on Monday. Tuesday fell out of bed. C/o of back pain- pt points to middle back. Pt is ambulatory. Last dose of pain medicine was at 4:30 per family member.

## 2018-09-26 NOTE — Consult Note (Signed)
Cardiology Consultation:   Patient ID: Pamela Ball MRN: [id number]; DOB: [birth year]  Admit date: 09/26/2018 Date of Consult: 09/26/2018  Primary Care Provider: Olin Hauser, DO Primary Cardiologist: New to [facility] Physician requesting consult: mody Reason for consult: Elevated troponin   Patient Profile:   Pamela Ball is a 72 y.o. female with a hx of osteoporosis, recent kyphoplasty, bipolar disorder, falls, hyperlipidemia, presents to the emergency room with back pain after fall on November 19  History of Present Illness:   Ms. Pamela Ball reports that she had kyphoplasty on Monday November 18, was doing well at home, fell out of the bed on Tuesday.  Family reports that she rolled out landed on the floor hurt her back.  Presented to the emergency room today with back pain Troponin was drawn initial value of 0.6 with repeat 0.8 She denies any significant shortness of breath or chest pain Family thinks it is from stress, patient thinks it was from the stress of the fall or stress from last week.  Denies any smoking history, denies any diabetes No recent stress testing or echocardiogram available for review Comfortably resting in bed at this time, denies back pain    Past Medical History:  Diagnosis Date  . Abdominal hernia with obstruction and without gangrene   . Anxiety 02/15/2017  . Bipolar 1 disorder, depressed, full remission ([place]) 02/15/2017  . Bipolar affective disorder ([place])   . COPD (chronic obstructive pulmonary disease) ([place]) 02/15/2017  . Depression   . Dyspnea   . GERD (gastroesophageal reflux disease) 02/15/2017  . Glaucoma   . Hiatal hernia   . History of abdominal hernia 05/21/2017  . History of compression fracture of spine 02/15/2017  . Hyperlipidemia   . Incisional hernia   . Incisional ventral hernia w obstruction 06/02/2017  . Normocytic anemia 05/23/2017  . 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 Presbycusis of both ears  02/15/2017    Past Surgical History:  Procedure Laterality Date  . ABDOMINAL HYSTERECTOMY  11/06/2006  . CESAREAN SECTION     x3  . COLONOSCOPY WITH PROPOFOL N/A 06/25/2018   Procedure: COLONOSCOPY WITH PROPOFOL;  Surgeon: Jonathon Bellows, MD;  Location: [facility] ENDOSCOPY;  Service: Endoscopy;  Laterality: N/A;  . KYPHOPLASTY    . KYPHOPLASTY N/A 09/23/2018   Procedure: KYPHOPLASTY T10;  Surgeon: Hessie Knows, MD;  Location: [facility] ORS;  Service: Orthopedics;  Laterality: N/A;  . VENTRAL HERNIA REPAIR N/A 06/02/2017   Procedure: exploratory laparotomy repair of incarcerated  VENTRAL hernia;  Surgeon: Florene Glen, MD;  Location: [facility] ORS;  Service: General;  Laterality: N/A;  . VENTRAL HERNIA REPAIR N/A 08/29/2018   Procedure: LAPAROSCOPIC INCISIONAL HERNIA;  Surgeon: Olean Ree, MD;  Location: [facility] ORS;  Service: General;  Laterality: N/A;     Home Medications:  Prior to Admission medications   Medication Sig Start Date End Date Taking? Authorizing Provider  buPROPion (WELLBUTRIN XL) 150 MG 24 hr tablet Take 150 mg by mouth daily.  01/11/15  Yes [provider]  busPIRone (BUSPAR) 10 MG tablet Take 1 tablet (10 mg total) by mouth daily. Prescribed by Psychiatry Dr Kasandra Knudsen 11/14/17  Yes Karamalegos, Devonne Doughty, DO  cholecalciferol (VITAMIN D) 1000 units tablet Take 1,000 Units by mouth daily.   Yes [provider]  diclofenac sodium (VOLTAREN) 1 % GEL Apply 4 g topically 4 (four) times daily. 09/19/18  Yes Triplett, Cari B, FNP  ibuprofen (ADVIL,MOTRIN) 600 MG tablet Take 1 tablet (600 mg total) by mouth  every 8 (eight) hours as needed for fever, mild pain or moderate pain. 08/29/18  Yes Piscoya, Jacqulyn Bath, MD  Multiple Vitamin (MULTI-VITAMINS) TABS Take 1 tablet by mouth daily.    Yes [provider]  OLANZapine (ZYPREXA) 5 MG tablet Take 5 mg by mouth at bedtime.  01/11/15  Yes [provider]  omeprazole (PRILOSEC) 20 MG capsule Take 1 capsule (20 mg total) by mouth  daily before breakfast. 05/14/18  Yes Karamalegos, Devonne Doughty, DO  oxyCODONE (ROXICODONE) 5 MG immediate release tablet Take 1 tablet (5 mg total) by mouth every 6 (six) hours as needed for moderate pain. 09/23/18 09/23/19 Yes Hessie Knows, MD  simvastatin (ZOCOR) 20 MG tablet Take 1 tablet (20 mg total) by mouth daily. 05/14/18  Yes Karamalegos, Devonne Doughty, DO  venlafaxine XR (EFFEXOR-XR) 75 MG 24 hr capsule Take 75 mg by mouth daily with breakfast.  01/11/15  Yes [provider]  vitamin A 10000 UNIT capsule Take 10,000 Units by mouth daily.   Yes [provider]  vitamin C (ASCORBIC ACID) 500 MG tablet Take 500 mg by mouth daily.   Yes [provider]  oxyCODONE (OXY IR/ROXICODONE) 5 MG immediate release tablet Take 1 tablet (5 mg total) by mouth every 4 (four) hours as needed for severe pain. Patient not taking: Reported on 09/26/2018 08/29/18   Olean Ree, MD    Inpatient Medications: Scheduled Meds: . aspirin  81 mg Oral Daily  . [START ON 09/27/2018] buPROPion  150 mg Oral q morning - 10a  . [START ON 09/27/2018] busPIRone  10 mg Oral Daily  . [START ON 09/27/2018] cholecalciferol  1,000 Units Oral Daily  . enoxaparin (LOVENOX) injection  40 mg Subcutaneous Q24H  . [START ON 09/27/2018] multivitamin with minerals  1 tablet Oral Daily  . OLANZapine  5 mg Oral QHS  . [START ON 09/27/2018] pantoprazole  40 mg Oral Daily  . [START ON 09/27/2018] simvastatin  20 mg Oral Daily  . [START ON 09/27/2018] venlafaxine XR  75 mg Oral Q breakfast  . [START ON 09/27/2018] vitamin A  10,000 Units Oral Daily  . [START ON 09/27/2018] vitamin C  500 mg Oral Daily   Continuous Infusions:  PRN Meds: acetaminophen **OR** acetaminophen, bisacodyl, hydrALAZINE, ibuprofen, morphine injection, ondansetron **OR** ondansetron (ZOFRAN) IV, oxyCODONE, polyethylene glycol  Allergies:   No Known Allergies  Social History:   Social History   Socioeconomic History  . Marital  status: Married    Spouse name: Tenzin Pavon  . Number of children: Not on file  . Years of education: [employer]  . Highest education level: Not on file  Occupational History  . Not on file  Social Needs  . Financial resource strain: Not hard at all  . Food insecurity:    Worry: Never true    Inability: Never true  . Transportation needs:    Medical: No    Non-medical: No  Tobacco Use  . Smoking status: Never Smoker  . Smokeless tobacco: Never Used  Substance and Sexual Activity  . Alcohol use: No  . Drug use: No  . Sexual activity: Not on file  Lifestyle  . Physical activity:    Days per week: 0 days    Minutes per session: 0 min  . Stress: Not at all  Relationships  . Social connections:    Talks on phone: More than three times a week    Gets together: More than three times a week    Attends  religious service: More than 4 times per year    Active member of club or organization: No    Attends meetings of clubs or organizations: Never    Relationship status: Married  . Intimate partner violence:    Fear of current or ex partner: No    Emotionally abused: No    Physically abused: No    Forced sexual activity: No  Other Topics Concern  . Not on file  Social History Narrative  . Not on file    Family History:    Family History  Problem Relation Age of Onset  . Cancer Mother        cancer  . Breast cancer Mother 48     ROS:  Please see the history of present illness.  Review of Systems  Constitutional: Negative.   Respiratory: Negative.   Cardiovascular: Negative.   Gastrointestinal: Negative.   Musculoskeletal: Positive for back pain.  Neurological: Negative.   Psychiatric/Behavioral: Negative.   All other systems reviewed and are negative.   Physical Exam/Data:   Vitals:   09/26/18 1116 09/26/18 1127 09/26/18 1130 09/26/18 1514  BP: 127/67  137/68 (!) 125/56  Pulse: 64  96 95  Resp: 16  20 19   Temp: 98.6 F (37 C)  98.2 F (36.8 C) 98.3 F  (36.8 C)  TempSrc: Oral  Oral Oral  SpO2: 98% (!) 88% 94% 96%  Weight:      Height:       No intake or output data in the 24 hours ending 09/26/18 1731 Filed Weights   09/26/18 0728  Weight: 59 kg   Body mass index is 25.39 kg/m.  General: Frail, no distress HEENT: normal Lymph: no adenopathy Neck: no JVD Endocrine:  No thryomegaly Vascular: No carotid bruits; FA pulses 2+ bilaterally without bruits  Cardiac:  normal S1, S2; RRR; no murmur  Lungs:  clear to auscultation bilaterally, no wheezing, rhonchi or rales  Abd: soft, nontender, no hepatomegaly  Ext: no edema Musculoskeletal: Thin, muscle atrophy Skin: warm and dry  Neuro:  CNs 2-12 intact, no focal abnormalities noted Psych:  Normal affect   EKG:  The EKG was personally reviewed and demonstrates:   Shows sinus tachycardia with right bundle branch block, nonspecific ST abnormality Right bundle branch block appears new compared to previous EKGs Telemetry:  Telemetry was personally reviewed and demonstrates:    Relevant CV Studies: Echocardiogram pending  Laboratory Data:  Chemistry Recent Labs  Lab 09/26/18 0852  NA 138  K 3.4*  CL 97*  CO2 28  GLUCOSE 147*  BUN 12  CREATININE 0.38*  CALCIUM 8.7*  GFRNONAA >60  GFRAA >60  ANIONGAP 13    Recent Labs  Lab 09/26/18 0852  PROT 6.6  ALBUMIN 3.2*  AST 33  ALT 21  ALKPHOS 148*  BILITOT 1.3*   Hematology Recent Labs  Lab 09/26/18 0852  WBC 9.3  RBC 4.59  HGB 14.7  HCT 44.7  MCV 97.4  MCH 32.0  MCHC 32.9  RDW 12.9  PLT 215   Cardiac Enzymes Recent Labs  Lab 09/26/18 0852 09/26/18 1249  TROPONINI 0.65* 0.89*   No results for input(s): TROPIPOC in the last 168 hours.  BNPNo results for input(s): BNP, PROBNP in the last 168 hours.  DDimer No results for input(s): DDIMER in the last 168 hours.  Radiology/Studies:  Dg Chest 1 View  Result Date: 09/26/2018 CLINICAL DATA:  Fall.  Generalized chest and back pain. EXAM: CHEST  1 VIEW  COMPARISON:  01/24/2015. FINDINGS: Mediastinum and hilar structures normal. Heart size normal. No pulmonary venous congestion. Mild bibasilar atelectasis/infiltrates. Small bilateral pleural effusions. No pneumothorax. Deformity noted the proximal right humerus consistent with old healed fracture. 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 Prior thoracic and lumbar vertebroplasties. Thoracolumbar spine scoliosis. IMPRESSION: Mild bibasilar atelectasis/infiltrates and small pleural effusions. Electronically Signed   By: Marcello Moores  Register   On: 09/26/2018 10:08   Dg Thoracic Spine 2 View  Result Date: 09/26/2018 CLINICAL DATA:  Mid back pain. Recent T10 surgery. Fall out of bed. EXAM: THORACIC SPINE 2 VIEWS COMPARISON:  CT 09/19/2018. FINDINGS: Changes of vertebral augmentation noted at T10, L1 and L2. Kyphotic deformity at L1, stable. No new fracture. IMPRESSION: Vertebral augmentation changes as above.  No acute bony abnormality. Electronically Signed   By: Rolm Baptise M.D.   On: 09/26/2018 08:51   Dg Thoracic Spine 2 View  Result Date: 09/23/2018 CLINICAL DATA:  T10 kyphoplasty. EXAM: THORACIC SPINE 2 VIEWS; DG C-ARM 61-120 MIN Radiation exposure index: 57.36 mGy. COMPARISON:  CT scan of September 19, 2018. FINDINGS: Six intraoperative fluoroscopic images of the lower thoracic spine demonstrate performance of T10 kyphoplasty. IMPRESSION: Status post T10 kyphoplasty. Electronically Signed   By: Marijo Conception, M.D.   On: 09/23/2018 15:27   Dg Lumbar Spine 2-3 Views  Result Date: 09/26/2018 CLINICAL DATA:  Mid back pain EXAM: LUMBAR SPINE - 2-3 VIEW COMPARISON:  09/19/2018 FINDINGS: Interval changes of vertebral augmentation at T10. Remote changes at L1 and L2. Kyphotic deformity at L1, stable. No acute fracture. IMPRESSION: Interval T10 vertebral augmentation.  No acute bony abnormality. Electronically Signed   By: Rolm Baptise M.D.   On: 09/26/2018 08:49   Dg C-arm  1-60 Min  Result Date: 09/23/2018 CLINICAL DATA:  T10 kyphoplasty. EXAM: THORACIC SPINE 2 VIEWS; DG C-ARM 61-120 MIN Radiation exposure index: 57.36 mGy. COMPARISON:  CT scan of September 19, 2018. FINDINGS: Six intraoperative fluoroscopic images of the lower thoracic spine demonstrate performance of T10 kyphoplasty. IMPRESSION: Status post T10 kyphoplasty. Electronically Signed   By: Marijo Conception, M.D.   On: 09/23/2018 15:27    Assessment and Plan:   1. Back pain Scheduled for MRI, recent kyphoplasty several days ago, fell out of bed day following kyphoplasty Currently without back pain but resting comfortably in bed Will defer to orthopedics  2.  Elevated troponin Possibly stress-induced, unable to exclude underlying coronary disease Appears to have new right bundle branch block No smoking history, no diabetes No prior stress testing Echocardiogram pending She does not want catheterization Long discussion concerning other testing available, Hesitant to proceed with stress testing but she will do it, at the urging of her family We will order pharmacological Myoview for tomorrow, n.p.o. after midnight Aspirin, statin, beta-blocker Given no symptoms of chest pain shortness of breath, will hold off on heparin at this time  Bipolar disorder \\on  Wellbutrin, BuSpar, Zyprexa, Effexor This may explain the delay in responses, hesitation with making decisions  Hyperlipidemia Continue statin   Total encounter time more than 110 minutes  Greater than 50% was spent in counseling and coordination of care with the patient  For questions or updates, please contact [place] Please consult [email or web address] for contact info under     Signed, Ida Rogue, MD  09/26/2018 5:31 PM

## 2018-09-27 ENCOUNTER — Inpatient Hospital Stay (HOSPITAL_COMMUNITY)
Admit: 2018-09-27 | Discharge: 2018-09-27 | Disposition: A | Discharge status: Home / Self Care | Payer: Medicare Other | Attending: Internal Medicine | Admitting: Internal Medicine

## 2018-09-27 ENCOUNTER — Other Ambulatory Visit: Payer: Medicare Other

## 2018-09-27 ENCOUNTER — Encounter: Payer: Self-pay | Admitting: Orthopedic Surgery

## 2018-09-27 DIAGNOSIS — I340 Nonrheumatic mitral (valve) insufficiency: Secondary | ICD-10-CM

## 2018-09-27 DIAGNOSIS — R7989 Other specified abnormal findings of blood chemistry: Secondary | ICD-10-CM

## 2018-09-27 DIAGNOSIS — I351 Nonrheumatic aortic (valve) insufficiency: Secondary | ICD-10-CM

## 2018-09-27 LAB — CBC
HEMATOCRIT: 40.4 % (ref 36.0–46.0)
HEMOGLOBIN: 12.9 g/dL (ref 12.0–15.0)
MCH: 31.9 pg (ref 26.0–34.0)
MCHC: 31.9 g/dL (ref 30.0–36.0)
MCV: 99.8 fL (ref 80.0–100.0)
Platelets: 238 10*3/uL (ref 150–400)
RBC: 4.05 MIL/uL (ref 3.87–5.11)
RDW: 13.1 % (ref 11.5–15.5)
WBC: 9 10*3/uL (ref 4.0–10.5)
nRBC: 0 % (ref 0.0–0.2)

## 2018-09-27 LAB — BASIC METABOLIC PANEL
ANION GAP: 6 (ref 5–15)
BUN: 21 mg/dL (ref 8–23)
CHLORIDE: 96 mmol/L — AB (ref 98–111)
CO2: 33 mmol/L — AB (ref 22–32)
Calcium: 8.5 mg/dL — ABNORMAL LOW (ref 8.9–10.3)
Creatinine, Ser: 0.82 mg/dL (ref 0.44–1.00)
GFR calc non Af Amer: >60 mL/min (ref >60)
Glucose, Bld: 154 mg/dL — ABNORMAL HIGH (ref 70–99)
POTASSIUM: 3.1 mmol/L — AB (ref 3.5–5.1)
Sodium: 135 mmol/L (ref 135–145)

## 2018-09-27 LAB — ECHOCARDIOGRAM COMPLETE
HEIGHTINCHES: 60 in
Weight: 2080 oz

## 2018-09-27 MED ORDER — ASPIRIN 81 MG PO TBEC: 81.0000 mg | DELAYED_RELEASE_TABLET | Freq: Every day | ORAL | 1 refills | Status: AC

## 2018-09-27 MED ORDER — BOOST / RESOURCE BREEZE PO LIQD CUSTOM: 1.0000 | Freq: Three times a day (TID) | ORAL | Status: DC

## 2018-09-27 MED ORDER — POTASSIUM CHLORIDE CRYS ER 20 MEQ PO TBCR
40.0000 meq | EXTENDED_RELEASE_TABLET | Freq: Once | ORAL | Status: AC
  Administered 2018-09-27: 40 meq via ORAL
  Filled 2018-09-27: qty 2

## 2018-09-27 MED ORDER — CARVEDILOL 3.125 MG PO TABS
3.1250 mg | ORAL_TABLET | Freq: Two times a day (BID) | ORAL | Status: DC
  Administered 2018-09-27 (×2): 3.125 mg via ORAL
  Filled 2018-09-27 (×2): qty 1

## 2018-09-27 MED ORDER — METOPROLOL SUCCINATE ER 25 MG PO TB24: 25.0000 mg | ORAL_TABLET | Freq: Every day | ORAL | 1 refills | Status: DC

## 2018-09-27 NOTE — Plan of Care (Signed)

## 2018-09-27 NOTE — Progress Notes (Signed)
Progress Note  Patient Name: Pamela Ball Date of Encounter: 09/27/2018  Primary Cardiologist: new to [facility] - consult by [place]. Never with chest pain. Did not eat dinner, sleepy. Troponin peaked at 0.89 and is down trending. Echo is pending. Potassium remains low at 3.1. BP improved.   Inpatient Medications    Scheduled Meds: . aspirin  81 mg Oral Daily  . buPROPion  150 mg Oral q morning - 10a  . busPIRone  10 mg Oral Daily  . cholecalciferol  1,000 Units Oral Daily  . enoxaparin (LOVENOX) injection  40 mg Subcutaneous Q24H  . multivitamin with minerals  1 tablet Oral Daily  . OLANZapine  5 mg Oral QHS  . pantoprazole  40 mg Oral Daily  . simvastatin  20 mg Oral Daily  . venlafaxine XR  75 mg Oral Q breakfast  . vitamin A  10,000 Units Oral Daily  . vitamin C  500 mg Oral Daily   Continuous Infusions:  PRN Meds: acetaminophen **OR** acetaminophen, bisacodyl, hydrALAZINE, ibuprofen, morphine injection, ondansetron **OR** ondansetron (ZOFRAN) IV, oxyCODONE, polyethylene glycol   Vital Signs    Vitals:   09/27/18 0537 09/27/18 0654 09/27/18 0718 09/27/18 0718  BP: 127/63     Pulse: (!) 111   (!) 103  Resp:      Temp: 99.8 F (37.7 C) 99.1 F (37.3 C)    TempSrc: Oral Oral    SpO2: 95%  96% 96%  Weight:      Height:        Intake/Output Summary (Last 24 hours) at 09/27/2018 0751 Last data filed at 09/26/2018 2015 Gross per 24 hour  Intake -  Output 250 ml  Net -250 ml   Filed Weights   09/26/18 0728  Weight: 59 kg    Telemetry    NSR - Personally Reviewed  ECG    n/a - Personally Reviewed  Physical Exam   GEN: No acute distress.   Neck: No JVD. Cardiac: RRR, no murmurs, rubs, or gallops.  Respiratory: Clear to auscultation bilaterally.  GI: Soft, nontender, non-distended.   MS: No edema; No deformity. Neuro:  Alert and oriented x 3; Nonfocal.  Psych: Normal affect.  Labs    Chemistry Recent  Labs  Lab 09/26/18 0852 09/27/18 0016  NA 138 135  K 3.4* 3.1*  CL 97* 96*  CO2 28 33*  GLUCOSE 147* 154*  BUN 12 21  CREATININE 0.38* 0.82  CALCIUM 8.7* 8.5*  PROT 6.6  --   ALBUMIN 3.2*  --   AST 33  --   ALT 21  --   ALKPHOS 148*  --   BILITOT 1.3*  --   GFRNONAA >60 >60  GFRAA >60 >60  ANIONGAP 13 6     Hematology Recent Labs  Lab 09/26/18 0852 09/27/18 0016  WBC 9.3 9.0  RBC 4.59 4.05  HGB 14.7 12.9  HCT 44.7 40.4  MCV 97.4 99.8  MCH 32.0 31.9  MCHC 32.9 31.9  RDW 12.9 13.1  PLT 215 238    Cardiac Enzymes Recent Labs  Lab 09/26/18 0852 09/26/18 1249 09/26/18 1801 09/27/18 0016  TROPONINI 0.65* 0.89* 0.33* 0.57*   No results for input(s): TROPIPOC in the last 168 hours.   BNPNo results for input(s): BNP, PROBNP in the last 168 hours.   DDimer No results for input(s): DDIMER in the last 168 hours.   Radiology    Dg Chest 1 View  Result Date: 09/26/2018 IMPRESSION: Mild bibasilar atelectasis/infiltrates and small pleural effusions. Electronically Signed   By: Marcello Moores  Register   On: 09/26/2018 10:08   Dg Thoracic Spine 2 View  Result Date: 09/26/2018 IMPRESSION: Vertebral augmentation changes as above.  No acute bony abnormality. Electronically Signed   By: Rolm Baptise M.D.   On: 09/26/2018 08:51   Dg Lumbar Spine 2-3 Views  Result Date: 09/26/2018 IMPRESSION: Interval T10 vertebral augmentation.  No acute bony abnormality. Electronically Signed   By: Rolm Baptise M.D.   On: 09/26/2018 08:49   Mr Lumbar Spine Wo Contrast  Result Date: 09/26/2018 IMPRESSION: 1. No acute fracture. Status post T10, L1 and L2 vertebral body cement augmentation. Osteopenia. 2. Chronic severe T12-L1 canal stenosis. No lumbar spine neural foraminal narrowing. Electronically Signed   By: Elon Alas M.D.   On: 09/26/2018 22:09    Cardiac Studies   Echo pending  Patient Profile     72 y.o. female with history of osteoporosis, recent kyphoplasty, bipolar  disorder, falls, hyperlipidemia, presents to the emergency room with back pain after fall on September 24, 2018 and was admitted in the setting of mildly elevated troponin, which was checked for unclear reasons.   Assessment & Plan    1. Elevated troponin/new RBBB: -Refusing stress test -Never with chest pain -Elevated troponin of uncertain significance given lack of anginal symptoms -Echo pending -ASA, statin, beta blocker  -Can revisit ischemic evaluation as an outpatient if she wants  2. Hypokalemia: -Recommend repletion to goal 4.0  3. HLD: -Statin  For questions or updates, please contact [place] Please consult [email or web address] for contact info under Cardiology/STEMI.    Signed, Christell Faith, PA-C [place] Pager: [phone] 09/27/2018, 7:51 AM

## 2018-09-27 NOTE — Progress Notes (Signed)
Patient given discharge instructions with husband at bedside. Prescriptions given. Patient and husband verbalized understanding with no further questions. Patient going home via family vehicle.

## 2018-09-27 NOTE — Evaluation (Signed)
Physical Therapy Evaluation Patient Details Name: Pamela Ball MRN: [id number] DOB: Nov 07, 1945 Today's Date: 09/27/2018   History of Present Illness  Pamela Ball is a 72yo female who comes to [facility] on 11/22 after a fall at home on 11/19 and signifcant limiting back pain. Pt is s/p t10 plasty 11/18. PMH: osteoporosis, bipolar depression, falls, HLD, and new RBBB, now followed by cardiology, but pt not agreeable to pursuing agressive workup. Pt also seen by orthopedist this admission, reporting no surgical intervention at this time.   Clinical Impression  Pt admitted with above diagnosis. Pt currently with functional limitations due to the deficits listed below (see "PT Problem List"). Upon entry, pt in bed, husband present, NA in room preparing patient for a trip to [place]. The pt is awake and agreeable to participate, delayed in response, minimally verbal, and making little eye contact, mostly staring forward at wall. The pt is alert is oriented to self, but requires extensive questioning to provide first name. Pt following simple commands consistently. MinA required for journey to EOB, but standing without assistance x3. AMB is limited to <44ft prior to exhaustion and with HR in 120s. Functional mobility assessment demonstrates increased effort/time requirements, poor tolerance, and need for physical assistance, whereas the patient performed these at a higher level of independence PTA. Pt will benefit from skilled PT intervention to increase independence and safety with basic mobility in preparation for discharge to the venue listed below.       Follow Up Recommendations Home health PT;Supervision/Assistance - 24 hour    Equipment Recommendations  Rolling walker with 5" wheels    Recommendations for Other Services       Precautions / Restrictions Precautions Precautions: Fall Precaution Comments: fall PTA, history of remote falls      Mobility  Bed Mobility Overal bed mobility:  Needs Assistance Bed Mobility: Supine to Sit     Supine to sit: Min assist     General bed mobility comments: pulls self up on arm of NA   Transfers Overall transfer level: Needs assistance Equipment used: None Transfers: Sit to/from Stand           General transfer comment: no LOB, moderate effort required   Ambulation/Gait Ambulation/Gait assistance: Supervision Gait Distance (Feet): 35 Feet Assistive device: None Gait Pattern/deviations: WFL(Within Functional Limits)     General Gait Details: slow, but asks to sit after 2 laps in room, d/t exhaustion. HR 125.   Stairs            Wheelchair Mobility    Modified Rankin (Stroke Patients Only)       Balance                                             Pertinent Vitals/Pain Pain Assessment: 0-10 Pain Score: 2  Pain Location: stomach pain, reports she ate too much.  Pain Descriptors / Indicators: Aching Pain Intervention(s): Limited activity within patient's tolerance;Monitored during session    Home Living Family/patient expects to be discharged to:: Private residence Living Arrangements: Spouse/significant other Available Help at Discharge: Family Type of Home: House Home Access: Stairs to enter   Technical brewer of Steps: 1 partial step  Home Layout: One level Home Equipment: None      Prior Function Level of Independence: Independent         Comments: Mostly household AMB without assistive device,  1 fall in past 3 months. Husband helps with IADL.      Hand Dominance        Extremity/Trunk Assessment   Upper Extremity Assessment Upper Extremity Assessment: Generalized weakness;Overall [facility] for tasks assessed    Lower Extremity Assessment Lower Extremity Assessment: Generalized weakness;Overall WFL for tasks assessed       Communication   Communication: No difficulties  Cognition Arousal/Alertness: Awake/alert Behavior During Therapy: WFL for tasks  assessed/performed Overall Cognitive Status: Impaired/Different from baseline Area of Impairment: Attention;Following commands;Problem solving                   Current Attention Level: Divided;Alternating         Problem Solving: Slow processing;Requires verbal cues General Comments: pt not responding to questioning from PT, at times husband interjects more loudly repeating questions and patient answers minimally. He reports her to be somewhat altered, placing blame on pain meds.       General Comments      Exercises     Assessment/Plan    PT Assessment Patient needs continued PT services  PT Problem List Decreased activity tolerance;Decreased balance;Decreased mobility;Cardiopulmonary status limiting activity       PT Treatment Interventions DME instruction;Balance training;Gait training;Stair training;Functional mobility training;Therapeutic activities;Therapeutic exercise;Patient/family education    PT Goals (Current goals can be found in the Care Plan section)  Acute Rehab PT Goals PT Goal Formulation: Patient unable to participate in goal setting    Frequency Min 2X/week   Barriers to discharge   husband works, but will be able to provide 24/7 assist at [facility] "6 Clicks" Daily Activity  Outcome Measure Difficulty turning over in bed (including adjusting bedclothes, sheets and blankets)?: Unable Difficulty moving from lying on back to sitting on the side of the bed? : Unable Difficulty sitting down on and standing up from a chair with arms (e.g., wheelchair, bedside commode, etc,.)?: Unable Help needed moving to and from a bed to chair (including a wheelchair)?: A Little Help needed walking in hospital room?: A Little Help needed climbing 3-5 steps with a railing? : A Little 6 Click Score: 12    End of Session Equipment Utilized During Treatment: Oxygen Activity Tolerance: Patient tolerated treatment  well;Patient limited by fatigue Patient left: in chair;with family/visitor present;with call bell/phone within reach;with nursing/sitter in room Nurse Communication: Mobility status PT Visit Diagnosis: Difficulty in walking, not elsewhere classified (R26.2);Muscle weakness (generalized) (M62.81);History of falling (Z91.81)    Time: 8676-1950 PT Time Calculation (min) (ACUTE ONLY): 14 min   Charges:   PT Evaluation $PT Eval Moderate Complexity: 1 Mod         10:07 AM, 09/27/18 Etta Grandchild, PT, DPT Physical Therapist - [facility]  [phone] ([place])     Audreana Hancox C 09/27/2018, 10:04 AM

## 2018-09-27 NOTE — Discharge Summary (Signed)
[place] [facility] [place] NAME: Pamela Ball    MR#:  [id number]  DATE OF BIRTH:  18-Aug-1946  DATE OF ADMISSION:  09/26/2018 ADMITTING PHYSICIAN: Bettey Costa, MD  DATE OF DISCHARGE: 09/27/2018  PRIMARY CARE PHYSICIAN: Olin Hauser, DO    ADMISSION DIAGNOSIS:  NSTEMI (non-ST elevated myocardial infarction) ([place]) [I21.4] Fall, initial encounter [W19.XXXA] Acute midline low back pain, unspecified whether sciatica present [M54.5]  DISCHARGE DIAGNOSIS:  Active Problems:   Elevated troponin   SECONDARY DIAGNOSIS:   Past Medical History:  Diagnosis Date  . Abdominal hernia with obstruction and without gangrene   . Anxiety 02/15/2017  . Bipolar 1 disorder, depressed, full remission ([place]) 02/15/2017  . Bipolar affective disorder ([place])   . COPD (chronic obstructive pulmonary disease) ([place]) 02/15/2017  . Depression   . Dyspnea   . GERD (gastroesophageal reflux disease) 02/15/2017  . Glaucoma   . Hiatal hernia   . History of abdominal hernia 05/21/2017  . History of compression fracture of spine 02/15/2017  . Hyperlipidemia   . Incisional hernia   . Incisional ventral hernia w obstruction 06/02/2017  . Normocytic anemia 05/23/2017  . 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 Presbycusis of both ears 02/15/2017    HOSPITAL COURSE:   72 year old female with past medical history of bipolar disorder, COPD, hypertension, hyperlipidemia, glaucoma who presented to the hospital with chest pain and noted to have mildly elevated troponin.  1.  Chest pain with elevated troponin- patient presented to the hospital with atypical symptoms of angina.  She had a mildly elevated troponin which did not trend upwards.  Patient was seen by cardiology and the recommended a nuclear medicine stress test but the patient refused. - Patient underwent echocardiogram which showed normal ejection fraction with no acute wall motion abnormalities. - Patient  will continue aspirin, low-dose beta-blocker and statin follow-up with cardiology as an outpatient. -Patient symptoms are likely stress mediated.  2.  Back pain- patient had recent kyphoplasty done.  Patient was seen by orthopedics.  MRI of the lumbar spine was done which showed no acute fracture or pathology.  Orthopedics recommended continuing pain control and outpatient physical therapy.  Patient is being up for home health physical therapy. -Continue oxycodone as needed for pain control.  3.  History of bipolar disorder-patient will continue her maintenance meds including Zyprexa.  4.  Depression/anxiety-patient will continue her BuSpar, Wellbutrin.  5.  GERD-patient will continue her omeprazole.  6.  Hyperlipidemia-patient will continue her Zocor.  DISCHARGE CONDITIONS:   Stable  CONSULTS OBTAINED:  Treatment Team:  Minna Merritts, MD Hessie Knows, MD  DRUG ALLERGIES:  No Known Allergies  DISCHARGE MEDICATIONS:   Allergies as of 09/27/2018   No Known Allergies     Medication List    TAKE these medications   aspirin 81 MG EC tablet Take 1 tablet (81 mg total) by mouth daily. Swallow whole.   buPROPion 150 MG 24 hr tablet Commonly known as:  WELLBUTRIN XL Take 150 mg by mouth daily.   busPIRone 10 MG tablet Commonly known as:  BUSPAR Take 1 tablet (10 mg total) by mouth daily. Prescribed by Psychiatry Dr Kasandra Knudsen   cholecalciferol 1000 units tablet Commonly known as:  VITAMIN D Take 1,000 Units by mouth daily.   diclofenac sodium 1 % Gel Commonly known as:  VOLTAREN Apply 4 g topically 4 (four) times daily.   ibuprofen 600 MG tablet Commonly known as:  ADVIL,MOTRIN Take 1 tablet (  600 mg total) by mouth every 8 (eight) hours as needed for fever, mild pain or moderate pain.   metoprolol succinate 25 MG 24 hr tablet Commonly known as:  TOPROL-XL Take 1 tablet (25 mg total) by mouth daily.   MULTI-VITAMINS Tabs Take 1 tablet by mouth daily.   OLANZapine 5  MG tablet Commonly known as:  ZYPREXA Take 5 mg by mouth at bedtime.   omeprazole 20 MG capsule Commonly known as:  PRILOSEC Take 1 capsule (20 mg total) by mouth daily before breakfast.   oxyCODONE 5 MG immediate release tablet Commonly known as:  Oxy IR/ROXICODONE Take 1 tablet (5 mg total) by mouth every 6 (six) hours as needed for moderate pain. What changed:  Another medication with the same name was removed. Continue taking this medication, and follow the directions you see here.   simvastatin 20 MG tablet Commonly known as:  ZOCOR Take 1 tablet (20 mg total) by mouth daily.   venlafaxine XR 75 MG 24 hr capsule Commonly known as:  EFFEXOR-XR Take 75 mg by mouth daily with breakfast.   vitamin A 10000 UNIT capsule Take 10,000 Units by mouth daily.   vitamin C 500 MG tablet Commonly known as:  ASCORBIC ACID Take 500 mg by mouth daily.            Durable Medical Equipment  (From admission, onward)         Start     Ordered   09/27/18 1546  For home use only DME Walker rolling  Once    Question:  Patient needs a walker to treat with the following condition  Answer:  Weakness   09/27/18 1545            DISCHARGE INSTRUCTIONS:   DIET:  Cardiac diet  DISCHARGE CONDITION:  Stable  ACTIVITY:  Activity as tolerated  OXYGEN:  Home Oxygen: No.   Oxygen Delivery: room air  DISCHARGE LOCATION:  Home with Home Health PT, RN, Aide.    If you experience worsening of your admission symptoms, develop shortness of breath, life threatening emergency, suicidal or homicidal thoughts you must seek medical attention immediately by calling 911 or calling your MD immediately  if symptoms less severe.  You Must read complete instructions/literature along with all the possible adverse reactions/side effects for all the Medicines you take and that have been prescribed to you. Take any new Medicines after you have completely understood and accpet all the possible adverse  reactions/side effects.   Please note  You were cared for by a hospitalist during your hospital stay. If you have any questions about your discharge medications or the care you received while you were in the hospital after you are discharged, you can call the unit and asked to speak with the hospitalist on call if the hospitalist that took care of you is not available. Once you are discharged, your primary care physician will handle any further medical issues. Please note that NO REFILLS for any discharge medications will be authorized once you are discharged, as it is imperative that you return to your primary care physician (or establish a relationship with a primary care physician if you do not have one) for your aftercare needs so that they can reassess your need for medications and monitor your lab values.     Today   No acute events overnight,.  Patient still complaining of some atypical chest pain but refusing to have a stress test done.  Hemodynamically stable.  Troponins  have not trended upwards.  Will discharge home on medical management and outpatient follow-up with cardiology.  VITAL SIGNS:  Blood pressure (!) 129/52, pulse 95, temperature 98.2 F (36.8 C), temperature source Oral, resp. rate 20, height 5' (1.524 m), weight 59 kg, SpO2 93 %.  I/O:    Intake/Output Summary (Last 24 hours) at 09/27/2018 1642 Last data filed at 09/27/2018 1355 Gross per 24 hour  Intake 720 ml  Output 250 ml  Net 470 ml    PHYSICAL EXAMINATION:  GENERAL:  72 y.o.-year-old patient lying in the bed with no acute distress.  EYES: Pupils equal, round, reactive to light and accommodation. No scleral icterus. Extraocular muscles intact.  HEENT: Head atraumatic, normocephalic. Oropharynx and nasopharynx clear.  NECK:  Supple, no jugular venous distention. No thyroid enlargement, no tenderness.  LUNGS: Normal breath sounds bilaterally, no wheezing, rales,rhonchi. No use of accessory muscles of  respiration.  CARDIOVASCULAR: S1, S2 normal. No murmurs, rubs, or gallops.  ABDOMEN: Soft, non-tender, non-distended. Bowel sounds present. No organomegaly or mass.  EXTREMITIES: No pedal edema, cyanosis, or clubbing.  NEUROLOGIC: Cranial nerves II through XII are intact. No focal motor or sensory defecits b/l.  PSYCHIATRIC: The patient is alert and oriented x 3. Flat affect.  SKIN: No obvious rash, lesion, or ulcer.   DATA REVIEW:   CBC Recent Labs  Lab 09/27/18 0016  WBC 9.0  HGB 12.9  HCT 40.4  PLT 238    Chemistries  Recent Labs  Lab 09/26/18 0852 09/27/18 0016  NA 138 135  K 3.4* 3.1*  CL 97* 96*  CO2 28 33*  GLUCOSE 147* 154*  BUN 12 21  CREATININE 0.38* 0.82  CALCIUM 8.7* 8.5*  AST 33  --   ALT 21  --   ALKPHOS 148*  --   BILITOT 1.3*  --     Cardiac Enzymes Recent Labs  Lab 09/27/18 0016  TROPONINI 0.57*    Microbiology Results  Results for orders placed or performed in visit on 08/13/17  Urine Culture     Status: None   Collection Time: 08/13/17  9:26 AM  Result Value Ref Range Status   MICRO NUMBER: [id number]  Final   SPECIMEN QUALITY: ADEQUATE  Final   Sample Source URINE  Final   STATUS: FINAL  Final   Result:   Final    Single organism less than 10,000 CFU/mL isolated. These organisms, commonly found on external and internal genitalia, are considered colonizers. No further testing performed.    RADIOLOGY:  Dg Chest 1 View  Result Date: 09/26/2018 CLINICAL DATA:  Fall.  Generalized chest and back pain. EXAM: CHEST  1 VIEW COMPARISON:  01/24/2015. FINDINGS: Mediastinum and hilar structures normal. Heart size normal. No pulmonary venous congestion. Mild bibasilar atelectasis/infiltrates. Small bilateral pleural effusions. No pneumothorax. Deformity noted the proximal right humerus consistent with old healed fracture. 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 Prior thoracic and lumbar vertebroplasties.  Thoracolumbar spine scoliosis. IMPRESSION: Mild bibasilar atelectasis/infiltrates and small pleural effusions. Electronically Signed   By: Marcello Moores  Register   On: 09/26/2018 10:08   Dg Thoracic Spine 2 View  Result Date: 09/26/2018 CLINICAL DATA:  Mid back pain. Recent T10 surgery. Fall out of bed. EXAM: THORACIC SPINE 2 VIEWS COMPARISON:  CT 09/19/2018. FINDINGS: Changes of vertebral augmentation noted at T10, L1 and L2. Kyphotic deformity at L1, stable. No new fracture. IMPRESSION: Vertebral augmentation changes as above.  No acute bony abnormality. Electronically Signed  By: Rolm Baptise M.D.   On: 09/26/2018 08:51   Dg Lumbar Spine 2-3 Views  Result Date: 09/26/2018 CLINICAL DATA:  Mid back pain EXAM: LUMBAR SPINE - 2-3 VIEW COMPARISON:  09/19/2018 FINDINGS: Interval changes of vertebral augmentation at T10. Remote changes at L1 and L2. Kyphotic deformity at L1, stable. No acute fracture. IMPRESSION: Interval T10 vertebral augmentation.  No acute bony abnormality. Electronically Signed   By: Rolm Baptise M.D.   On: 09/26/2018 08:49   Mr Lumbar Spine Wo Contrast  Result Date: 09/26/2018 CLINICAL DATA:  Worsening low back pain, status post T10 kyphoplasty. EXAM: MRI LUMBAR SPINE WITHOUT CONTRAST TECHNIQUE: Multiplanar, multisequence MR imaging of the lumbar spine was performed. No intravenous contrast was administered. COMPARISON:  Lumbar spine radiograph September 26, 2018 and CT lumbar spine September 19, 2018 and CT abdomen and pelvis March 05, 2018 FINDINGS: SEGMENTATION: For the purposes of this report, the last well-formed intervertebral disc is reported as L5-S1. ALIGNMENT: Maintained lumbar lordosis. No malalignment. Similar focal thoracolumbar kyphosis. VERTEBRAE:Status post T10 kyphoplasty with similar mild height loss. Old L1 and L2 compression fractures, status post kyphoplasty without further height loss. Remaining lumbar vertebral bodies intact. No suspicious or acute bone marrow signal.  Generally bright T1 and T2 bone marrow signal compatible with osteopenia. Disc heights generally maintained with mild disc desiccation. Congenitally capacious canal. CONUS MEDULLARIS AND CAUDA EQUINA: Conus medullaris terminates at L1, mild deformity due to canal stenosis. Normal appearance of the cauda equina. PARASPINAL AND OTHER SOFT TISSUES: Nonacute. Moderate paraspinal muscle atrophy. 2.2 cm T2 bright cyst LEFT kidney. DISC LEVELS: T10-11: Mild endplate spurring without disc bulge, or canal stenosis. Mild suspected LEFT neural foraminal narrowing though limited by motion. T11-12: No disc bulge, canal stenosis nor neural foraminal narrowing. T12-L1: Retropulsed bony fragments resultant severe canal stenosis, AP dimension of the canal is 5 mm, unchanged. No neural foraminal narrowing. L1-2: No disc bulge, canal stenosis nor neural foraminal narrowing. L2-3: Annular bulging mild facet arthropathy without canal stenosis or neural foraminal narrowing. L3-4 through L5-S1: No disc bulge, canal stenosis nor neural foraminal narrowing. Moderate facet arthropathy. IMPRESSION: 1. No acute fracture. Status post T10, L1 and L2 vertebral body cement augmentation. Osteopenia. 2. Chronic severe T12-L1 canal stenosis. No lumbar spine neural foraminal narrowing. Electronically Signed   By: Elon Alas M.D.   On: 09/26/2018 22:09      Management plans discussed with the patient, family and they are in agreement.  CODE STATUS:     Code Status Orders  (From admission, onward)         Start     Ordered   09/26/18 1205  Full code  Continuous     09/26/18 1204         TOTAL TIME TAKING CARE OF THIS PATIENT: 40 minutes.    Henreitta Leber M.D on 09/27/2018 at 4:42 PM  Between 7am to 6pm - Pager - [phone]  After 6pm go to [email or web address] - Proofreader  [facility]  Hospitalists  Office  [phone]  CC: Primary care physician; Olin Hauser, DO

## 2018-09-27 NOTE — Care Management Note (Signed)
Case Management Note  Patient Details  Name: Pamela Ball MRN: [id number] Date of Birth: September 14, 1946  Subjective/Objective:    Patient has discharge order.  She will be discharging to home with husband.  She did not qualify for home oxygen.   Denies difficulties obtaining medications or with medical care.  Current with PCP.  Rolling walker delivered to patient room by Pamela Ball.  Will discharge with [facility]ela Ball home health.  They will not be able to open case  until early next week.  No other agencies are available for this payor.    Patients husband will be home with patient and verbalizes understanding and feels comfortable waiting until early next week for a visit from home health.   No further needs identified at this time by Pamela Ball.    Action/Plan:   Expected Discharge Date:  09/27/18               Expected Discharge Plan:  Pamela Ball  In-House Referral:     Discharge planning Services  CM Consult  Post Acute Care Choice:  Home Health Choice offered to:  Patient, Spouse  DME Arranged:  Gilford Rile DME Agency:     HH Arranged:  RN, PT, Nurse's Aide Pamela Ball Agency:  Pamela Ball  Status of Service:  Completed, signed off  If discussed at Pamela Ball of Stay Meetings, dates discussed:    Additional Comments:  Pamela Rafter, RN 09/27/2018, 4:26 PM

## 2018-09-27 NOTE — Progress Notes (Signed)
MRI shows no new fracture, includes up to T 10.  Continue with PT as tolerated.

## 2018-09-27 NOTE — Progress Notes (Signed)
Initial Nutrition Assessment  DOCUMENTATION CODES:   Not applicable  INTERVENTION:  Boost Breeze po TID, each supplement provides 250 kcal and 9 grams of protein (pt did not specify flavor) Magic cup TID with meals, each supplement provides 290 kcal and 9 grams of protein   NUTRITION DIAGNOSIS:   Increased nutrient needs related to wound healing, decreased appetite(kyphoplasty 09/24/2018) as evidenced by energy intake < 75% for > 7 days, per patient/family report.   GOAL:   Patient will meet greater than or equal to 90% of their needs   MONITOR:   PO intake, Supplement acceptance, Weight trends, Labs  REASON FOR ASSESSMENT:   Malnutrition Screening Tool    ASSESSMENT:  72 year old female with known history of recent kyphoplasty (09/24/18), bipolar disorder, COPD, GERD, abdominal hernia, HLD who presented to ED for back pain after mechanical fall and increased heart rate and found to have elevated tropin.   Pt sitting in chair with husband in room at visit. Pt reports not feeling well today and poor historian d/t falling asleep during interview. Pt assisted/prompted by husband to recall breakfast (oatmeal, eggs, blueberry muffin and juice) husband reports pt ate ~ 50%. Husband stated that patient has had recent declines in PO intake over the past few weeks. Husband will make a bowl of cereal for his wife before leaving for work and prepares dinner most nights. Husband stated that he enjoyed grilling out and recalled hamburgers, hotdogs, chicken, hot wings with bleu cheese as some of their favorites.  Husband reports drinking Ensure and stated he tries to get his wife to drink them, but she does not really care for them; pt does not care for milk and finds them too chalky. Husband reports drinking the vanilla Ensure that was brought in this morning.  RD offered Boost Breeze as alternate ONS; husband agreeable to try in any flavor  Medications: Vit D, MVI, Protonix, potassium  chloride 46mEq tablet, vit A, vit C, morphine  Labs: Potassium 3.1 (L) - replacing Glucose 154 (H) Lab Results  Component Value Date   HGBA1C 6.2 (H) 09/26/2018      NUTRITION - FOCUSED PHYSICAL EXAM:    Most Recent Value  Orbital Region  Mild depletion  Upper Arm Region  Mild depletion  Thoracic and Lumbar Region  Unable to assess [4 days s/p kyphoplasty]  Buccal Region  Mild depletion  Temple Region  Mild depletion  Clavicle Bone Region  No depletion  Clavicle and Acromion Bone Region  No depletion  Scapular Bone Region  Unable to assess  Dorsal Hand  Mild depletion  Patellar Region  Unable to assess  Anterior Thigh Region  No depletion  Posterior Calf Region  Unable to assess  Edema (RD Assessment)  None  Hair  Reviewed  Eyes  Reviewed  Mouth  Reviewed  Skin  Reviewed  Nails  Reviewed       Diet Order:   Diet Order            Diet Heart Room service appropriate? Yes; Fluid consistency: Thin  Diet effective now              EDUCATION NEEDS:   No education needs have been identified at this time  Skin:  Skin Assessment: Reviewed RN Assessment(incision (closed); back)  Last BM:  09/26/2018; WDL  Height:   Ht Readings from Last 1 Encounters:  09/26/18 5' (1.524 m)    Weight:   Wt Readings from Last 1 Encounters:  09/26/18 59  kg    Ideal Body Weight:  45.5 kg  BMI:  Body mass index is 25.39 kg/m.  Estimated Nutritional Needs:   Kcal:  8833-7445 (MSJ 1.2-1.3)  Protein:  76-89 grams (1.3-1.5g/kg)  Fluid:  1.2-1.3L    Lajuan Lines, RD, LDN  After Hours/Weekend Pager: [phone]

## 2018-09-27 NOTE — Plan of Care (Signed)
  Problem: Pain Managment: Goal: General experience of comfort will improve Outcome: Progressing   Problem: Safety: Goal: Ability to remain free from injury will improve Outcome: Progressing   

## 2018-09-27 NOTE — Progress Notes (Signed)
*  PRELIMINARY RESULTS* Echocardiogram 2D Echocardiogram has been performed.  Pamela Ball 09/27/2018, 11:37 AM

## 2018-09-30 ENCOUNTER — Telehealth: Payer: Self-pay

## 2018-09-30 LAB — TROPONIN I: Troponin I: 0.57 ng/mL (ref <0.03)

## 2018-09-30 NOTE — Telephone Encounter (Signed)
Transition Care Management Follow-up Telephone Call  Date of discharge and from where: [facility] on [facility]  How have you been since you were released from the hospital? Feeling sore in buttocks but other than that feeling good  Any questions or concerns? No   Items Reviewed:  Did the pt receive and understand the discharge instructions provided? Yes   Medications obtained and verified? Yes   Any new allergies since your discharge? No   Dietary orders reviewed? Yes  Do you have support at home? Yes , lives with husband  Other (ie: DME, Home Health, etc) N/A  Functional Questionnaire: (I = Independent and D = Dependent) ADL's: I  Bathing/Dressing- I   Meal Prep- I  Eating- I  Maintaining continence- I  Transferring/Ambulation- I  Managing Meds- I   Follow up appointments reviewed:    PCP Hospital f/u appt confirmed? Yes  Informed to call [facility] and schedule appointment  Specialist Hospital f/u appt confirmed? N/A  Are transportation arrangements needed? No   If their condition worsens, is the pt aware to call  their PCP or go to the ED? Yes  Was the patient provided with contact information for the PCP's office or ED? Yes  Was the pt encouraged to call back with questions or concerns? Yes

## 2018-10-01 ENCOUNTER — Other Ambulatory Visit: Payer: Self-pay | Admitting: *Deleted

## 2018-10-01 DIAGNOSIS — I214 Non-ST elevation (NSTEMI) myocardial infarction: Secondary | ICD-10-CM | POA: Diagnosis not present

## 2018-10-01 DIAGNOSIS — M170 Bilateral primary osteoarthritis of knee: Secondary | ICD-10-CM | POA: Diagnosis not present

## 2018-10-01 DIAGNOSIS — Z79891 Long term (current) use of opiate analgesic: Secondary | ICD-10-CM | POA: Diagnosis not present

## 2018-10-01 DIAGNOSIS — Z9181 History of falling: Secondary | ICD-10-CM | POA: Diagnosis not present

## 2018-10-01 DIAGNOSIS — I10 Essential (primary) hypertension: Secondary | ICD-10-CM | POA: Diagnosis not present

## 2018-10-01 DIAGNOSIS — J449 Chronic obstructive pulmonary disease, unspecified: Secondary | ICD-10-CM | POA: Diagnosis not present

## 2018-10-01 DIAGNOSIS — Z4789 Encounter for other orthopedic aftercare: Secondary | ICD-10-CM | POA: Diagnosis not present

## 2018-10-01 NOTE — Patient Outreach (Signed)
[facility]) Care Management  10/01/2018  Pamela Ball 09-Jul-1946 [id number]  Initial telephone outreach   Transition of care by PCP office of [place].  Referral received 11/22 Referral source : [facility] case manager  Referral reason : Fall, recent back surgery  Insurance : [employer]  PMHx : COPD, GERD, compression fracture of spine, Anemia , abdominal hernia , anxiety , bipolar depression .   Noted recent admission to [facility] 11/21-11/21 Dx: Chest pain, elevated troponin, and fall. Kyphoplasty 11/18.   Placed initial outreach call patient home number no answer mailbox is full not accepting messages.  Placed call to mobile number patient answering identified has patient spouse, emergency contact,  Crosby Oyster, HIPAA  verified x 2 identifiers.  Explained reason for the call ,spouse is at home at this time but will call wife and notify her of my call.   Placed return call to patient, HIPAA verified, explained reason for the call .   Social  Patient lives at home with her spouse that is able to assist, with bathing, dressing as needed.  Patient has been visited by [place] and therapist on today and next visit on Friday.    Conditions Patient reports having recent fall a few days after her back surgery. She discussed she did pretty good with therapy on today and has to get used to using the walker.  Patient discussed back pain and her husband called Dr.Menz about pain medication that has been prescribed and helping . Patient denies any other chronic medical conditions .   Patient denies having chest pain or discomfort at this time.   Medications  Patient denies cost concerns related to medications , she is able to manage her medications at home, and husband assist if needed.   Advanced Directive  No advanced directive in place , declined receiving information at this time.   Appointments Patient has follow up appointment  with Dr. Rudene Christians, orthopedic on 12/2 Patient has not scheduled post discharge visit with Dr. Parks Ranger, states she normally schedules appointments and she will do also, advised regarding importance of follow up after discharge.   Consent [facility] care management services have been explained and offered at this time. Patient has declined need for [facility] care management services.   Plan  Will send successful outreach letter to patient.  Reinforced  keeping all medical appointments and notifying PCP of new concerns   Joylene Draft, RN, [place] Management Coordinator  [phone]- Mobile [phone]- Toll Free Main Office

## 2018-10-02 ENCOUNTER — Telehealth: Payer: Self-pay | Admitting: Family Medicine

## 2018-10-02 DIAGNOSIS — J449 Chronic obstructive pulmonary disease, unspecified: Secondary | ICD-10-CM | POA: Diagnosis not present

## 2018-10-02 DIAGNOSIS — Z4789 Encounter for other orthopedic aftercare: Secondary | ICD-10-CM | POA: Diagnosis not present

## 2018-10-02 DIAGNOSIS — I214 Non-ST elevation (NSTEMI) myocardial infarction: Secondary | ICD-10-CM | POA: Diagnosis not present

## 2018-10-02 DIAGNOSIS — I10 Essential (primary) hypertension: Secondary | ICD-10-CM | POA: Diagnosis not present

## 2018-10-02 DIAGNOSIS — Z79891 Long term (current) use of opiate analgesic: Secondary | ICD-10-CM | POA: Diagnosis not present

## 2018-10-02 DIAGNOSIS — M170 Bilateral primary osteoarthritis of knee: Secondary | ICD-10-CM | POA: Diagnosis not present

## 2018-10-02 DIAGNOSIS — Z9181 History of falling: Secondary | ICD-10-CM | POA: Diagnosis not present

## 2018-10-02 NOTE — Telephone Encounter (Signed)
Judeen Hammans with [facility] asked for verbal for additional skilled nursing visits [phone]

## 2018-10-02 NOTE — Telephone Encounter (Signed)
Verbal given 

## 2018-10-04 DIAGNOSIS — M170 Bilateral primary osteoarthritis of knee: Secondary | ICD-10-CM | POA: Diagnosis not present

## 2018-10-04 DIAGNOSIS — I10 Essential (primary) hypertension: Secondary | ICD-10-CM | POA: Diagnosis not present

## 2018-10-04 DIAGNOSIS — J449 Chronic obstructive pulmonary disease, unspecified: Secondary | ICD-10-CM | POA: Diagnosis not present

## 2018-10-04 DIAGNOSIS — Z4789 Encounter for other orthopedic aftercare: Secondary | ICD-10-CM | POA: Diagnosis not present

## 2018-10-04 DIAGNOSIS — Z9181 History of falling: Secondary | ICD-10-CM | POA: Diagnosis not present

## 2018-10-04 DIAGNOSIS — Z79891 Long term (current) use of opiate analgesic: Secondary | ICD-10-CM | POA: Diagnosis not present

## 2018-10-04 DIAGNOSIS — I214 Non-ST elevation (NSTEMI) myocardial infarction: Secondary | ICD-10-CM | POA: Diagnosis not present

## 2018-10-07 DIAGNOSIS — Z79891 Long term (current) use of opiate analgesic: Secondary | ICD-10-CM | POA: Diagnosis not present

## 2018-10-07 DIAGNOSIS — J449 Chronic obstructive pulmonary disease, unspecified: Secondary | ICD-10-CM | POA: Diagnosis not present

## 2018-10-07 DIAGNOSIS — Z4789 Encounter for other orthopedic aftercare: Secondary | ICD-10-CM | POA: Diagnosis not present

## 2018-10-07 DIAGNOSIS — I10 Essential (primary) hypertension: Secondary | ICD-10-CM | POA: Diagnosis not present

## 2018-10-07 DIAGNOSIS — Z9181 History of falling: Secondary | ICD-10-CM | POA: Diagnosis not present

## 2018-10-07 DIAGNOSIS — I214 Non-ST elevation (NSTEMI) myocardial infarction: Secondary | ICD-10-CM | POA: Diagnosis not present

## 2018-10-07 DIAGNOSIS — M170 Bilateral primary osteoarthritis of knee: Secondary | ICD-10-CM | POA: Diagnosis not present

## 2018-10-08 DIAGNOSIS — Z4789 Encounter for other orthopedic aftercare: Secondary | ICD-10-CM | POA: Diagnosis not present

## 2018-10-08 DIAGNOSIS — J449 Chronic obstructive pulmonary disease, unspecified: Secondary | ICD-10-CM | POA: Diagnosis not present

## 2018-10-08 DIAGNOSIS — M170 Bilateral primary osteoarthritis of knee: Secondary | ICD-10-CM | POA: Diagnosis not present

## 2018-10-08 DIAGNOSIS — Z9181 History of falling: Secondary | ICD-10-CM | POA: Diagnosis not present

## 2018-10-08 DIAGNOSIS — I214 Non-ST elevation (NSTEMI) myocardial infarction: Secondary | ICD-10-CM | POA: Diagnosis not present

## 2018-10-08 DIAGNOSIS — I10 Essential (primary) hypertension: Secondary | ICD-10-CM | POA: Diagnosis not present

## 2018-10-08 DIAGNOSIS — Z79891 Long term (current) use of opiate analgesic: Secondary | ICD-10-CM | POA: Diagnosis not present

## 2018-10-09 DIAGNOSIS — S22070D Wedge compression fracture of T9-T10 vertebra, subsequent encounter for fracture with routine healing: Secondary | ICD-10-CM | POA: Diagnosis not present

## 2018-10-10 DIAGNOSIS — I214 Non-ST elevation (NSTEMI) myocardial infarction: Secondary | ICD-10-CM | POA: Diagnosis not present

## 2018-10-10 DIAGNOSIS — Z4789 Encounter for other orthopedic aftercare: Secondary | ICD-10-CM | POA: Diagnosis not present

## 2018-10-10 DIAGNOSIS — J449 Chronic obstructive pulmonary disease, unspecified: Secondary | ICD-10-CM | POA: Diagnosis not present

## 2018-10-10 DIAGNOSIS — Z9181 History of falling: Secondary | ICD-10-CM | POA: Diagnosis not present

## 2018-10-10 DIAGNOSIS — I10 Essential (primary) hypertension: Secondary | ICD-10-CM | POA: Diagnosis not present

## 2018-10-10 DIAGNOSIS — Z79891 Long term (current) use of opiate analgesic: Secondary | ICD-10-CM | POA: Diagnosis not present

## 2018-10-10 DIAGNOSIS — M170 Bilateral primary osteoarthritis of knee: Secondary | ICD-10-CM | POA: Diagnosis not present

## 2018-10-11 DIAGNOSIS — J449 Chronic obstructive pulmonary disease, unspecified: Secondary | ICD-10-CM | POA: Diagnosis not present

## 2018-10-11 DIAGNOSIS — Z79891 Long term (current) use of opiate analgesic: Secondary | ICD-10-CM | POA: Diagnosis not present

## 2018-10-11 DIAGNOSIS — M170 Bilateral primary osteoarthritis of knee: Secondary | ICD-10-CM | POA: Diagnosis not present

## 2018-10-11 DIAGNOSIS — Z4789 Encounter for other orthopedic aftercare: Secondary | ICD-10-CM | POA: Diagnosis not present

## 2018-10-11 DIAGNOSIS — Z9181 History of falling: Secondary | ICD-10-CM | POA: Diagnosis not present

## 2018-10-11 DIAGNOSIS — I10 Essential (primary) hypertension: Secondary | ICD-10-CM | POA: Diagnosis not present

## 2018-10-11 DIAGNOSIS — I214 Non-ST elevation (NSTEMI) myocardial infarction: Secondary | ICD-10-CM | POA: Diagnosis not present

## 2018-10-14 ENCOUNTER — Encounter: Payer: Self-pay | Admitting: Surgery

## 2018-10-14 DIAGNOSIS — Z79891 Long term (current) use of opiate analgesic: Secondary | ICD-10-CM | POA: Diagnosis not present

## 2018-10-14 DIAGNOSIS — J449 Chronic obstructive pulmonary disease, unspecified: Secondary | ICD-10-CM | POA: Diagnosis not present

## 2018-10-14 DIAGNOSIS — Z9181 History of falling: Secondary | ICD-10-CM | POA: Diagnosis not present

## 2018-10-14 DIAGNOSIS — I214 Non-ST elevation (NSTEMI) myocardial infarction: Secondary | ICD-10-CM | POA: Diagnosis not present

## 2018-10-14 DIAGNOSIS — M170 Bilateral primary osteoarthritis of knee: Secondary | ICD-10-CM | POA: Diagnosis not present

## 2018-10-14 DIAGNOSIS — Z4789 Encounter for other orthopedic aftercare: Secondary | ICD-10-CM | POA: Diagnosis not present

## 2018-10-14 DIAGNOSIS — I10 Essential (primary) hypertension: Secondary | ICD-10-CM | POA: Diagnosis not present

## 2018-10-18 ENCOUNTER — Telehealth: Payer: Self-pay | Admitting: Family Medicine

## 2018-10-18 DIAGNOSIS — Z9181 History of falling: Secondary | ICD-10-CM | POA: Diagnosis not present

## 2018-10-18 DIAGNOSIS — J449 Chronic obstructive pulmonary disease, unspecified: Secondary | ICD-10-CM | POA: Diagnosis not present

## 2018-10-18 DIAGNOSIS — I214 Non-ST elevation (NSTEMI) myocardial infarction: Secondary | ICD-10-CM | POA: Diagnosis not present

## 2018-10-18 DIAGNOSIS — Z4789 Encounter for other orthopedic aftercare: Secondary | ICD-10-CM | POA: Diagnosis not present

## 2018-10-18 DIAGNOSIS — M170 Bilateral primary osteoarthritis of knee: Secondary | ICD-10-CM | POA: Diagnosis not present

## 2018-10-18 DIAGNOSIS — I10 Essential (primary) hypertension: Secondary | ICD-10-CM | POA: Diagnosis not present

## 2018-10-18 DIAGNOSIS — Z79891 Long term (current) use of opiate analgesic: Secondary | ICD-10-CM | POA: Diagnosis not present

## 2018-10-18 NOTE — Telephone Encounter (Signed)
Pamela Ball, PT with [facility] called to report a missed visit.  Pt said she was in pain and husband did schedule a visit with Dr Raliegh Ip on 12/16.  Pamela Ball said pt does have a history of chronic pain and surgeon is prescribing pain medication.  Husband said he misplaced last prescription so surgeon prescribed more.  Pamela Ball didn't want pt to asked for more when she comes in next week.  Her call back number is [phone]

## 2018-10-18 NOTE — Telephone Encounter (Signed)
Pamela Ball, PT HH back to discuss. She expressed concerns for possible patient safety, home alone for many hours, as her husband works. Patient has declining vision and has limited ability at times to call for help it seems. She has been able to function quite well despite these limitations. She still needs pain medicine, see note below about this, and it was re ordered already by surgeon.  I asked if we can get Social Work eval at home for more assessment and determine if escalation of caregiver support is needed, she may benefit from 24 hr. They will consider personal care services options for her and possibly may need facility in future if cannot be cared for at home due to safety.  I will see patient for face to face encounter Monday 12/16 and document at that time. Verbal given today for CSW eval.  [name], DO [facility] 10/18/2018, 12:14 PM

## 2018-10-18 NOTE — Telephone Encounter (Signed)
Please review

## 2018-10-21 ENCOUNTER — Encounter: Payer: Self-pay | Admitting: Family Medicine

## 2018-10-21 ENCOUNTER — Ambulatory Visit (INDEPENDENT_AMBULATORY_CARE_PROVIDER_SITE_OTHER): Payer: Medicare Other | Admitting: Family Medicine

## 2018-10-21 VITALS — BP 162/68 | HR 96 | Temp 98.4°F | Resp 16 | Ht 59.0 in | Wt 120.0 lb

## 2018-10-21 DIAGNOSIS — Z8781 Personal history of (healed) traumatic fracture: Secondary | ICD-10-CM

## 2018-10-21 DIAGNOSIS — M170 Bilateral primary osteoarthritis of knee: Secondary | ICD-10-CM

## 2018-10-21 DIAGNOSIS — Z79891 Long term (current) use of opiate analgesic: Secondary | ICD-10-CM | POA: Diagnosis not present

## 2018-10-21 DIAGNOSIS — N12 Tubulo-interstitial nephritis, not specified as acute or chronic: Secondary | ICD-10-CM

## 2018-10-21 DIAGNOSIS — Z9181 History of falling: Secondary | ICD-10-CM | POA: Diagnosis not present

## 2018-10-21 DIAGNOSIS — Z4789 Encounter for other orthopedic aftercare: Secondary | ICD-10-CM | POA: Diagnosis not present

## 2018-10-21 DIAGNOSIS — I214 Non-ST elevation (NSTEMI) myocardial infarction: Secondary | ICD-10-CM | POA: Diagnosis not present

## 2018-10-21 DIAGNOSIS — J449 Chronic obstructive pulmonary disease, unspecified: Secondary | ICD-10-CM | POA: Diagnosis not present

## 2018-10-21 DIAGNOSIS — I10 Essential (primary) hypertension: Secondary | ICD-10-CM | POA: Diagnosis not present

## 2018-10-21 LAB — POCT URINALYSIS DIPSTICK
BILIRUBIN UA: NEGATIVE
Glucose, UA: NEGATIVE
KETONES UA: NEGATIVE
NITRITE UA: NEGATIVE
PH UA: 5 (ref 5.0–8.0)
Protein, UA: NEGATIVE
Spec Grav, UA: 1.015 (ref 1.010–1.025)
UROBILINOGEN UA: 0.2 U/dL

## 2018-10-21 MED ORDER — CEFTRIAXONE SODIUM 1 G IJ SOLR
1.0000 g | Freq: Once | INTRAMUSCULAR | Status: AC
  Administered 2018-10-21: 1 g via INTRAMUSCULAR

## 2018-10-21 MED ORDER — CIPROFLOXACIN HCL 500 MG PO TABS: 500.0000 mg | ORAL_TABLET | Freq: Two times a day (BID) | ORAL | 0 refills | Status: DC

## 2018-10-21 MED ORDER — ONDANSETRON 4 MG PO TBDP: 4.0000 mg | ORAL_TABLET | Freq: Three times a day (TID) | ORAL | 0 refills | Status: DC | PRN

## 2018-10-21 NOTE — Patient Instructions (Addendum)
Thank you for coming to the office today.  I am concerned you may have a kidney infection  You received an antibiotic injection today in office, Ceftriaxone (Rocephin)  Next you will need to finish the oral antibiotic course as prescribed - Cipro 500mg  twice a day for 7 days  May take Zofran as needed for nausea vomiting  - We sent urine for a culture, we will call you within next few days if we need to change antibiotics  - Please drink plenty of fluids, improve hydration over next 1 week  If symptoms worsening - cannot tolerate oral antibiotic pills, or if worsening developing nausea / vomiting, worsening back pain, fevers / chills / sweats, then please go to hospital Emergency Dept for evaluation - may need IV antibiotic therapy  Please schedule a Follow-up Appointment to: Return in about 1 week (around 10/28/2018), or if symptoms worsen or fail to improve, for UTI vs Pyelo.  If you have any other questions or concerns, please feel free to call the office or send a message through [place]. You may also schedule an earlier appointment if necessary.  Additionally, you may be receiving a survey about your experience at our office within a few days to 1 week by e-mail or mail. We value your feedback.  [name], DO [place]

## 2018-10-21 NOTE — Progress Notes (Signed)
Subjective:    Patient ID: Pamela Ball, female    DOB: [birth year], 72 y.o.   MRN: [id number]  Pamela Ball is a 72 y.o. female presenting on 10/21/2018 for Pyelonephritis (as per patient back pain, fever onset 4 days had back and hernia surgery within month, urgency and frequency onset several days)   HPI   History primarily provided by patient and husband, [name].  Pyelonephritis Recent worsening symptoms of UTI with urinary frequency, reduced urine output, L flank pain, and reported fever, onset few days now. She did have prior surgery 10/09/18 about 2 weeks ago for kyphoplasty thoracic spine, but states symptoms are unrelated and onset later not post op initially. - Similar to previous UTI. - Recent hospitalization 11/21 for NSTEMI - Admits some nausea, without vomiting Denies chest pain, dyspnea, vomiting, diarrhea, abdominal pain  Additionally Chronic Pain / OA/DJD Osteoarthritis / Compression Fracture s/p Kyphoplasty Reduced vision Regarding Home Care status - patient is still living at home with limited caregiver support, sometimes she is home alone. She has Home Health, spoke to them 12/13 see phone note, they were given verbal order for Clinical Social Worker to evaluate patient and consider if she needed higher level of care. By her report today, they did not make any further arrangements, will stay tuned for any other update  Depression screen [facility] 2/9 10/21/2018 10/01/2018 05/14/2018  Decreased Interest 0 0 0  Down, Depressed, Hopeless 0 1 0  PHQ - 2 Score 0 1 0  Altered sleeping - - 0  Tired, decreased energy - - 0  Change in appetite - - 0  Feeling bad or failure about yourself  - - 0  Trouble concentrating - - 0  Moving slowly or fidgety/restless - - 0  Suicidal thoughts - - 0  PHQ-9 Score - - -  Difficult doing work/chores - - Not difficult at all    Social History   Tobacco Use  . Smoking status: Never Smoker  . Smokeless tobacco: Never Used    Substance Use Topics  . Alcohol use: No  . Drug use: No    Review of Systems Per HPI unless specifically indicated above     Objective:    BP (!) 162/68   Pulse 96   Temp 98.4 F (36.9 C) (Oral)   Resp 16   Ht 4\' 11"  (1.499 m)   Wt 120 lb (54.4 kg)   BMI 24.24 kg/m   Wt Readings from Last 3 Encounters:  10/21/18 120 lb (54.4 kg)  09/26/18 130 lb (59 kg)  09/23/18 130 lb (59 kg)    Physical Exam Vitals signs and nursing note reviewed.  Constitutional:      General: She is not in acute distress.    Appearance: She is well-developed. She is not diaphoretic.     Comments: Uncomfortable with flank pain, cooperative, laying down  HENT:     Head: Normocephalic and atraumatic.  Eyes:     General:        Right eye: No discharge.        Left eye: No discharge.     Conjunctiva/sclera: Conjunctivae normal.  Neck:     Musculoskeletal: Normal range of motion and neck supple.     Thyroid: No thyromegaly.  Cardiovascular:     Rate and Rhythm: Normal rate and regular rhythm.     Heart sounds: Normal heart sounds. No murmur.  Pulmonary:     Effort: Pulmonary effort is normal. No respiratory distress.  Breath sounds: Normal breath sounds. No wheezing or rales.  Abdominal:     General: Bowel sounds are normal. There is no distension.     Palpations: Abdomen is soft.  Musculoskeletal: Normal range of motion.     Comments: Left flank pain, reproduced on palpation near area of CVAT  Lymphadenopathy:     Cervical: No cervical adenopathy.  Skin:    General: Skin is warm and dry.     Findings: No erythema or rash.  Neurological:     Mental Status: She is alert and oriented to person, place, and time.  Psychiatric:        Behavior: Behavior normal.     Comments: Well groomed, good eye contact, normal speech and thoughts    Results for orders placed or performed in visit on 10/21/18  POCT urinalysis dipstick  Result Value Ref Range   Color, UA yellow    Clarity, UA cloudy     Glucose, UA Negative Negative   Bilirubin, UA negative    Ketones, UA negative    Spec Grav, UA 1.015 1.010 - 1.025   Blood, UA moderate    pH, UA 5.0 5.0 - 8.0   Protein, UA Negative Negative   Urobilinogen, UA 0.2 0.2 or 1.0 E.U./dL   Nitrite, UA negative    Leukocytes, UA Moderate (2+) (A) Negative   Appearance     Odor        Assessment & Plan:   Problem List Items Addressed This Visit    History of compression fracture of spine   Osteoarthritis of knees, bilateral    Other Visit Diagnoses    Pyelonephritis    -  Primary   Relevant Medications   cefTRIAXone (ROCEPHIN) injection 1 g (Completed)   ciprofloxacin (CIPRO) 500 MG tablet   ondansetron (ZOFRAN ODT) 4 MG disintegrating tablet   Other Relevant Orders   POCT urinalysis dipstick (Completed)   Urine Culture      Clinically consistent with pyelonephritis with fever, nausea, flank pain, and urinary symptoms Seems symptoms unrelated to spine from kyphoplasty  Plan: 1. UA consistent with UTI 2. Ordered Urine culture - had to cancel order bc patient did not leave enough urine 3. Ceftriaxone 1g IM today in office 4. Start PO antibiotics Cipro 500 BID x 7 days 5. Zofran ODT PRN nausea 6. Improve PO hydration 7. Strict return criteria given when to go to hospital or clinic if not improved or if cannot tolerate PO antibiotics, unfortunately unable to follow on sensitivities  #Osteoarthritis / Chronic Pain / S/p Kyphoplasty Reduced vision  Home Care / Social Work Clinically patient seems to be high risk for self care at home, she has limited caregiver support < 24 hours, due to her vision deficits and other chronic co morbidities, she remains high risk patient. She has received Home Health therapy, has demonstrated improvement in past. Recent inquiries regarding need for more home care / possibly personal care services done by clinical social worker today or recently. - Agree to proceed with home care, when requested  will completed orders as needed - Diagnoses such as COPD, Bipolar depression, Osteoarthritis both knees and back / w/ compression fracture s/p Kyphoplasty contributing diagnoses to limiting her function at home, and she would benefit from home care / personal care services   Meds ordered this encounter  Medications  . cefTRIAXone (ROCEPHIN) injection 1 g  . ciprofloxacin (CIPRO) 500 MG tablet    Sig: Take 1 tablet (500 mg total) by  mouth 2 (two) times daily. For 7 days    Dispense:  14 tablet    Refill:  0  . ondansetron (ZOFRAN ODT) 4 MG disintegrating tablet    Sig: Take 1 tablet (4 mg total) by mouth every 8 (eight) hours as needed for nausea or vomiting.    Dispense:  30 tablet    Refill:  0     Follow up plan: Return in about 1 week (around 10/28/2018), or if symptoms worsen or fail to improve, for UTI vs Pyelo.   [name], DO [facility] 10/21/2018, 4:44 PM

## 2018-10-23 ENCOUNTER — Telehealth: Payer: Self-pay | Admitting: Family Medicine

## 2018-10-23 DIAGNOSIS — Z79891 Long term (current) use of opiate analgesic: Secondary | ICD-10-CM | POA: Diagnosis not present

## 2018-10-23 DIAGNOSIS — J449 Chronic obstructive pulmonary disease, unspecified: Secondary | ICD-10-CM | POA: Diagnosis not present

## 2018-10-23 DIAGNOSIS — Z4789 Encounter for other orthopedic aftercare: Secondary | ICD-10-CM | POA: Diagnosis not present

## 2018-10-23 DIAGNOSIS — I10 Essential (primary) hypertension: Secondary | ICD-10-CM | POA: Diagnosis not present

## 2018-10-23 DIAGNOSIS — M170 Bilateral primary osteoarthritis of knee: Secondary | ICD-10-CM | POA: Diagnosis not present

## 2018-10-23 DIAGNOSIS — Z9181 History of falling: Secondary | ICD-10-CM | POA: Diagnosis not present

## 2018-10-23 DIAGNOSIS — I214 Non-ST elevation (NSTEMI) myocardial infarction: Secondary | ICD-10-CM | POA: Diagnosis not present

## 2018-10-23 NOTE — Telephone Encounter (Signed)
Documented in other telephone note already.  Pamela Ball, [facility] 10/23/2018, 12:54 PM

## 2018-10-23 NOTE — Telephone Encounter (Signed)
[facility] called, patient has HR 107, she has parameters to call if HR >100. Patient was anxious today about family stressor. No new complaints. I gave verbal to increase parameter to 110 HR given her co morbid conditions she may have higher HR on average. If new or acute symptoms should be notified or follow-up  [name], [facility] 10/23/2018, 12:45 PM

## 2018-10-23 NOTE — Telephone Encounter (Signed)
Pamela Ball called states that pt heart rate was 107

## 2018-10-25 DIAGNOSIS — Z9181 History of falling: Secondary | ICD-10-CM | POA: Diagnosis not present

## 2018-10-25 DIAGNOSIS — M170 Bilateral primary osteoarthritis of knee: Secondary | ICD-10-CM | POA: Diagnosis not present

## 2018-10-25 DIAGNOSIS — J449 Chronic obstructive pulmonary disease, unspecified: Secondary | ICD-10-CM | POA: Diagnosis not present

## 2018-10-25 DIAGNOSIS — Z4789 Encounter for other orthopedic aftercare: Secondary | ICD-10-CM | POA: Diagnosis not present

## 2018-10-25 DIAGNOSIS — I214 Non-ST elevation (NSTEMI) myocardial infarction: Secondary | ICD-10-CM | POA: Diagnosis not present

## 2018-10-25 DIAGNOSIS — Z79891 Long term (current) use of opiate analgesic: Secondary | ICD-10-CM | POA: Diagnosis not present

## 2018-10-25 DIAGNOSIS — I10 Essential (primary) hypertension: Secondary | ICD-10-CM | POA: Diagnosis not present

## 2018-10-28 ENCOUNTER — Telehealth: Payer: Self-pay | Admitting: Family Medicine

## 2018-10-28 DIAGNOSIS — I214 Non-ST elevation (NSTEMI) myocardial infarction: Secondary | ICD-10-CM | POA: Diagnosis not present

## 2018-10-28 DIAGNOSIS — I10 Essential (primary) hypertension: Secondary | ICD-10-CM | POA: Diagnosis not present

## 2018-10-28 DIAGNOSIS — Z79891 Long term (current) use of opiate analgesic: Secondary | ICD-10-CM | POA: Diagnosis not present

## 2018-10-28 DIAGNOSIS — J449 Chronic obstructive pulmonary disease, unspecified: Secondary | ICD-10-CM | POA: Diagnosis not present

## 2018-10-28 DIAGNOSIS — M170 Bilateral primary osteoarthritis of knee: Secondary | ICD-10-CM | POA: Diagnosis not present

## 2018-10-28 DIAGNOSIS — Z4789 Encounter for other orthopedic aftercare: Secondary | ICD-10-CM | POA: Diagnosis not present

## 2018-10-28 DIAGNOSIS — Z9181 History of falling: Secondary | ICD-10-CM | POA: Diagnosis not present

## 2018-10-28 NOTE — Telephone Encounter (Signed)
Pamela Ball with [name] said pt's BP in left arm was 170/102 and right arm 164/86.  She was prescribed metoprolol in hospital but she has not been taking it.  Her call back number is [phone]

## 2018-10-28 NOTE — Telephone Encounter (Signed)
She should restart Metoprolol XL 25mg  daily - I can order new rx if needed. This would help her BP and also control Heart Rate.  She should follow-up with Cardiologist after being seen in hospital to further help with blood pressure.  [name], [facility] 10/28/2018, 1:40 PM

## 2018-10-28 NOTE — Telephone Encounter (Signed)
Sonia Baller advised.

## 2018-10-28 NOTE — Telephone Encounter (Signed)
Pamela Ball with [name] states as below and she is not taking metoprolol please suggest ?

## 2018-11-01 DIAGNOSIS — Z4789 Encounter for other orthopedic aftercare: Secondary | ICD-10-CM | POA: Diagnosis not present

## 2018-11-01 DIAGNOSIS — Z9181 History of falling: Secondary | ICD-10-CM | POA: Diagnosis not present

## 2018-11-01 DIAGNOSIS — J449 Chronic obstructive pulmonary disease, unspecified: Secondary | ICD-10-CM | POA: Diagnosis not present

## 2018-11-01 DIAGNOSIS — Z79891 Long term (current) use of opiate analgesic: Secondary | ICD-10-CM | POA: Diagnosis not present

## 2018-11-01 DIAGNOSIS — M170 Bilateral primary osteoarthritis of knee: Secondary | ICD-10-CM | POA: Diagnosis not present

## 2018-11-01 DIAGNOSIS — I214 Non-ST elevation (NSTEMI) myocardial infarction: Secondary | ICD-10-CM | POA: Diagnosis not present

## 2018-11-01 DIAGNOSIS — I10 Essential (primary) hypertension: Secondary | ICD-10-CM | POA: Diagnosis not present

## 2018-11-08 DIAGNOSIS — Z9181 History of falling: Secondary | ICD-10-CM | POA: Diagnosis not present

## 2018-11-08 DIAGNOSIS — I214 Non-ST elevation (NSTEMI) myocardial infarction: Secondary | ICD-10-CM | POA: Diagnosis not present

## 2018-11-08 DIAGNOSIS — Z79891 Long term (current) use of opiate analgesic: Secondary | ICD-10-CM | POA: Diagnosis not present

## 2018-11-08 DIAGNOSIS — J449 Chronic obstructive pulmonary disease, unspecified: Secondary | ICD-10-CM | POA: Diagnosis not present

## 2018-11-08 DIAGNOSIS — Z4789 Encounter for other orthopedic aftercare: Secondary | ICD-10-CM | POA: Diagnosis not present

## 2018-11-08 DIAGNOSIS — M170 Bilateral primary osteoarthritis of knee: Secondary | ICD-10-CM | POA: Diagnosis not present

## 2018-11-08 DIAGNOSIS — I10 Essential (primary) hypertension: Secondary | ICD-10-CM | POA: Diagnosis not present

## 2018-11-11 ENCOUNTER — Telehealth: Payer: Self-pay | Admitting: Family Medicine

## 2018-11-11 NOTE — Telephone Encounter (Signed)
[name] with [facility] is requesting a call because pt wants to discharge from home health [phone]

## 2018-11-11 NOTE — Telephone Encounter (Signed)
Spoke to Pamela Ball patient wants to discharge from home health and they will discharge her by Wednesday or Thursday.

## 2018-11-11 NOTE — Telephone Encounter (Addendum)
Will discuss with patient at next opportunity, I have my concerns about her leaving home health services, last I discussed her status with home health - I gave him verbal orders to proceed with clinical social worker for evaluation to determine if we needed to escalate her care to facility or higher level of care given limited help at home, however no further changes were made as of her prior visit with home health.  We have considered future personal care services, but this is a limited option for patient as well.  [name], [facility] 11/11/2018, 2:27 PM

## 2018-11-12 ENCOUNTER — Encounter: Payer: Self-pay | Admitting: Emergency Medicine

## 2018-11-12 ENCOUNTER — Other Ambulatory Visit: Payer: Self-pay

## 2018-11-12 ENCOUNTER — Emergency Department: Payer: Medicare Other

## 2018-11-12 ENCOUNTER — Emergency Department
Admission: EM | Admit: 2018-11-12 | Discharge: 2018-11-12 | Disposition: A | Discharge status: Home / Self Care | Payer: Medicare Other | Attending: Emergency Medicine | Admitting: Emergency Medicine

## 2018-11-12 DIAGNOSIS — Z7982 Long term (current) use of aspirin: Secondary | ICD-10-CM | POA: Diagnosis not present

## 2018-11-12 DIAGNOSIS — J449 Chronic obstructive pulmonary disease, unspecified: Secondary | ICD-10-CM | POA: Insufficient documentation

## 2018-11-12 DIAGNOSIS — R109 Unspecified abdominal pain: Secondary | ICD-10-CM

## 2018-11-12 DIAGNOSIS — Z79899 Other long term (current) drug therapy: Secondary | ICD-10-CM | POA: Diagnosis not present

## 2018-11-12 DIAGNOSIS — I4510 Unspecified right bundle-branch block: Secondary | ICD-10-CM | POA: Diagnosis not present

## 2018-11-12 DIAGNOSIS — K5730 Diverticulosis of large intestine without perforation or abscess without bleeding: Secondary | ICD-10-CM | POA: Diagnosis not present

## 2018-11-12 DIAGNOSIS — N10 Acute tubulo-interstitial nephritis: Secondary | ICD-10-CM | POA: Diagnosis not present

## 2018-11-12 DIAGNOSIS — F419 Anxiety disorder, unspecified: Secondary | ICD-10-CM | POA: Insufficient documentation

## 2018-11-12 DIAGNOSIS — R1032 Left lower quadrant pain: Secondary | ICD-10-CM | POA: Diagnosis present

## 2018-11-12 DIAGNOSIS — F319 Bipolar disorder, unspecified: Secondary | ICD-10-CM | POA: Insufficient documentation

## 2018-11-12 LAB — BASIC METABOLIC PANEL
Anion gap: 6 (ref 5–15)
BUN: 15 mg/dL (ref 8–23)
CHLORIDE: 108 mmol/L (ref 98–111)
CO2: 25 mmol/L (ref 22–32)
Calcium: 9.2 mg/dL (ref 8.9–10.3)
Creatinine, Ser: 0.54 mg/dL (ref 0.44–1.00)
Glucose, Bld: 79 mg/dL (ref 70–99)
POTASSIUM: 4.2 mmol/L (ref 3.5–5.1)
SODIUM: 139 mmol/L (ref 135–145)

## 2018-11-12 LAB — URINALYSIS, COMPLETE (UACMP) WITH MICROSCOPIC
Bacteria, UA: NONE SEEN
Bilirubin Urine: NEGATIVE
Glucose, UA: NEGATIVE mg/dL
Hgb urine dipstick: NEGATIVE
Ketones, ur: NEGATIVE mg/dL
Leukocytes, UA: NEGATIVE
Nitrite: NEGATIVE
Protein, ur: NEGATIVE mg/dL
SPECIFIC GRAVITY, URINE: 1.011 (ref 1.005–1.030)
pH: 5 (ref 5.0–8.0)

## 2018-11-12 LAB — CBC
HEMATOCRIT: 45.5 % (ref 36.0–46.0)
HEMOGLOBIN: 14.8 g/dL (ref 12.0–15.0)
MCH: 31 pg (ref 26.0–34.0)
MCHC: 32.5 g/dL (ref 30.0–36.0)
MCV: 95.2 fL (ref 80.0–100.0)
NRBC: 0 % (ref 0.0–0.2)
Platelets: 260 10*3/uL (ref 150–400)
RBC: 4.78 MIL/uL (ref 3.87–5.11)
RDW: 13 % (ref 11.5–15.5)
WBC: 6.2 10*3/uL (ref 4.0–10.5)

## 2018-11-12 MED ORDER — KETOROLAC TROMETHAMINE 30 MG/ML IJ SOLN
15.0000 mg | Freq: Once | INTRAMUSCULAR | Status: AC
  Administered 2018-11-12: 15 mg via INTRAVENOUS
  Filled 2018-11-12: qty 1

## 2018-11-12 MED ORDER — ONDANSETRON 4 MG PO TBDP: 4.0000 mg | ORAL_TABLET | Freq: Three times a day (TID) | ORAL | 0 refills | Status: DC | PRN

## 2018-11-12 MED ORDER — SODIUM CHLORIDE 0.9 % IV SOLN
1.0000 g | Freq: Once | INTRAVENOUS | Status: AC
  Administered 2018-11-12: 1 g via INTRAVENOUS
  Filled 2018-11-12: qty 10

## 2018-11-12 MED ORDER — CEPHALEXIN 500 MG PO CAPS: 500.0000 mg | ORAL_CAPSULE | Freq: Two times a day (BID) | ORAL | 0 refills | Status: AC

## 2018-11-12 MED ORDER — ACETAMINOPHEN 325 MG PO TABS
650.0000 mg | ORAL_TABLET | Freq: Once | ORAL | Status: AC
  Administered 2018-11-12: 650 mg via ORAL
  Filled 2018-11-12: qty 2

## 2018-11-12 NOTE — Discharge Instructions (Addendum)
Your labs and CT scan are essentially normal. Take the antibiotic as directed. Take Tylenol as needed for pain. Follow-up with Dr. Raliegh Ip if symptoms worsen. Return to the ED if needed.

## 2018-11-12 NOTE — ED Provider Notes (Signed)
[facility] Emergency Department Provider Note ____________________________________________  Time seen: 4854  I have reviewed the triage vital signs and the nursing notes.  HISTORY  Chief Complaint  Flank Pain  HPI Pamela Ball is a 73 y.o. female presents to the ED accompanied by her family member, for evaluation of persistent left-sided flank pain since prior to Christmas.  Patient was evaluated by her primary care provider on 12/16 for UTI with concern for pyelonephritis.Marland Kitchen  Urinalysis showed leukocytes and urine culture was canceled due to low volume specimen.  Patient was treated with Cipro twice daily for 7 days.  She did not follow with primary provider, but notes continued left-sided flank pain in the interim.  She presents today after she called the provider's office to report her continued symptoms. She denies any dysuria, hematuria, or urinary retention.  She also denies any nausea, vomiting, fevers, chills, or abdominal pain.  Past Medical History:  Diagnosis Date  . Abdominal hernia with obstruction and without gangrene   . Anxiety 02/15/2017  . Bipolar 1 disorder, depressed, full remission ([place]) 02/15/2017  . Bipolar affective disorder ([place])   . COPD (chronic obstructive pulmonary disease) ([place]) 02/15/2017  . Depression   . Dyspnea   . GERD (gastroesophageal reflux disease) 02/15/2017  . Glaucoma   . Hiatal hernia   . History of abdominal hernia 05/21/2017  . History of compression fracture of spine 02/15/2017  . Hyperlipidemia   . Incisional hernia   . Incisional ventral hernia w obstruction 06/02/2017  . Normocytic anemia 05/23/2017  . 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 Presbycusis of both ears 02/15/2017    Patient Active Problem List   Diagnosis Date Noted  . Elevated troponin 09/26/2018  . Incisional hernia, without obstruction or gangrene 08/28/2018  . Allergic rhinitis due to allergen 05/14/2018  . Abdominal hernia with  obstruction and without gangrene   . Normocytic anemia 05/23/2017  . History of abdominal hernia 05/21/2017  . Bipolar 1 disorder, depressed, full remission ([place]) 02/15/2017  . GERD (gastroesophageal reflux disease) 02/15/2017  . COPD (chronic obstructive pulmonary disease) ([place]) 02/15/2017  . 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 Hyperlipidemia 02/15/2017  . Presbycusis of both ears 02/15/2017  . History of compression fracture of spine 02/15/2017  . Anxiety 02/15/2017    Past Surgical History:  Procedure Laterality Date  . ABDOMINAL HYSTERECTOMY  11/06/2006  . CESAREAN SECTION     x3  . COLONOSCOPY WITH PROPOFOL N/A 06/25/2018   Procedure: COLONOSCOPY WITH PROPOFOL;  Surgeon: Jonathon Bellows, MD;  Location: [facility] ENDOSCOPY;  Service: Endoscopy;  Laterality: N/A;  . KYPHOPLASTY    . KYPHOPLASTY N/A 09/23/2018   Procedure: KYPHOPLASTY T10;  Surgeon: Hessie Knows, MD;  Location: [facility] ORS;  Service: Orthopedics;  Laterality: N/A;  . VENTRAL HERNIA REPAIR N/A 06/02/2017   Procedure: exploratory laparotomy repair of incarcerated  VENTRAL hernia;  Surgeon: Florene Glen, MD;  Location: [facility] ORS;  Service: General;  Laterality: N/A;  . VENTRAL HERNIA REPAIR N/A 08/29/2018   Procedure: LAPAROSCOPIC INCISIONAL HERNIA;  Surgeon: Olean Ree, MD;  Location: [facility] ORS;  Service: General;  Laterality: N/A;    Prior to Admission medications   Medication Sig Start Date End Date Taking? Authorizing Provider  aspirin 81 MG EC tablet Take 1 tablet (81 mg total) by mouth daily. Swallow whole. 09/27/18 11/26/18  Henreitta Leber, MD  buPROPion (WELLBUTRIN XL) 150 MG 24 hr tablet Take 150 mg by mouth daily.  01/11/15  [provider]  busPIRone (BUSPAR) 10 MG tablet Take 1 tablet (10 mg total) by mouth daily. Prescribed by Psychiatry Dr Kasandra Knudsen 11/14/17   Karamalegos, Devonne Doughty, DO  cephALEXin (KEFLEX) 500 MG capsule Take 1 capsule (500 mg total) by mouth 2 (two) times daily for 7 days.  11/12/18 11/19/18  Ariellah Faust, Dannielle Karvonen, PA-C  cholecalciferol (VITAMIN D) 1000 units tablet Take 1,000 Units by mouth daily.    [provider]  diclofenac sodium (VOLTAREN) 1 % GEL Apply 4 g topically 4 (four) times daily. 09/19/18   Triplett, Johnette Abraham B, FNP  metoprolol succinate (TOPROL-XL) 25 MG 24 hr tablet Take 1 tablet (25 mg total) by mouth daily. 09/27/18 11/26/18  Henreitta Leber, MD  Multiple Vitamin (MULTI-VITAMINS) TABS Take 1 tablet by mouth daily.     [provider]  OLANZapine (ZYPREXA) 5 MG tablet Take 5 mg by mouth at bedtime.  01/11/15   [provider]  omeprazole (PRILOSEC) 20 MG capsule Take 1 capsule (20 mg total) by mouth daily before breakfast. 05/14/18   Karamalegos, Devonne Doughty, DO  ondansetron (ZOFRAN ODT) 4 MG disintegrating tablet Take 1 tablet (4 mg total) by mouth every 8 (eight) hours as needed. 11/12/18   Nyomie Ehrlich, Dannielle Karvonen, PA-C  simvastatin (ZOCOR) 20 MG tablet Take 1 tablet (20 mg total) by mouth daily. 05/14/18   Parks Ranger, Devonne Doughty, DO  venlafaxine XR (EFFEXOR-XR) 75 MG 24 hr capsule Take 75 mg by mouth daily with breakfast.  01/11/15   [provider]  vitamin A 10000 UNIT capsule Take 10,000 Units by mouth daily.    [provider]  vitamin C (ASCORBIC ACID) 500 MG tablet Take 500 mg by mouth daily.    [provider]    Allergies Codeine  Family History  Problem Relation Age of Onset  . Cancer Mother        cancer  . Breast cancer Mother 72    Social History Social History   Tobacco Use  . Smoking status: Never Smoker  . Smokeless tobacco: Never Used  Substance Use Topics  . Alcohol use: No  . Drug use: No    Review of Systems  Constitutional: Negative for fever. Cardiovascular: Negative for chest pain. Respiratory: Negative for shortness of breath. Gastrointestinal: Negative for abdominal pain, vomiting and diarrhea. Reports left flank tenderness Genitourinary: Negative for dysuria,  hematuria, or retention. Musculoskeletal: Negative for back pain. Skin: Negative for rash. Neurological: Negative for headaches, focal weakness or numbness. ____________________________________________  PHYSICAL EXAM:  VITAL SIGNS: ED Triage Vitals  Enc Vitals Group     BP 11/12/18 1546 (!) 162/67     Pulse Rate 11/12/18 1501 78     Resp 11/12/18 1501 16     Temp 11/12/18 1501 98.2 F (36.8 C)     Temp Source 11/12/18 1501 Oral     SpO2 11/12/18 1546 98 %     Weight 11/12/18 1503 120 lb (54.4 kg)     Height 11/12/18 1503 4\' 10"  (1.473 m)     Head Circumference --      Peak Flow --      Pain Score 11/12/18 1502 10     Pain Loc --      Pain Edu? --      Excl. in [place]? --     Constitutional: Alert and oriented. Well appearing and in no distress. Head: Normocephalic and atraumatic. Eyes: Conjunctivae are normal. PERRL. Normal extraocular movements Ears: Canals clear. TMs  intact bilaterally. Nose: No congestion/rhinorrhea/epistaxis. Mouth/Throat: Mucous membranes are moist. Neck: Supple. No thyromegaly. Hematological/Lymphatic/Immunological: No cervical lymphadenopathy. Cardiovascular: Normal rate, regular rhythm. Normal distal pulses. Respiratory: Normal respiratory effort. No wheezes/rales/rhonchi. Gastrointestinal: Soft and nontender. No distention. Musculoskeletal: Nontender with normal range of motion in all extremities.  Neurologic:  Normal gait without ataxia. Normal speech and language. No gross focal neurologic deficits are appreciated. Skin:  Skin is warm, dry and intact. No rash noted. Psychiatric: Mood and affect are normal. Patient exhibits appropriate insight and judgment. ____________________________________________   LABS (pertinent positives/negatives) Labs Reviewed  URINALYSIS, COMPLETE (UACMP) WITH MICROSCOPIC - Abnormal; Notable for the following components:      Result Value   Color, Urine YELLOW (*)    APPearance CLEAR (*)    All other components  within normal limits  URINE CULTURE  BASIC METABOLIC PANEL  CBC  ____________________________________________  EKG  Sinus rhythm 74 bpm occasional PVCs Incomplete RBBB No STEMI ____________________________________________   RADIOLOGY  CT Renal Stone Study  IMPRESSION: 1. No obstructing stone or hydronephrosis. 2.2 cm left renal cyst. Streak artifact extends through the kidneys. This in addition to lack of contrast limits evaluation for detection of kidney infection. 2. Remote L1 and L2 compression fracture treated with cement augmentation. Significant retropulsion L1 with kyphosis contributing to marked spinal stenosis and crowding of the conus similar to prior exam. 3. More recent T10 compression fracture has been treated with cement augmentation. Interval mild loss of height and retropulsion of the posterior inferior aspect T10 vertebra is new since 09/26/2018 MR and causes narrowing of the ventral aspect of thecal sac with slight cord flattening. 4. Prior anterior abdominal wall ventral hernia repair. There is a broad-based bulge at the level of the hernia repair with fat and anterior wall of bowel extending into this bulge without entrapment. 5. Moderately large size hiatal hernia. 6. Large proximal duodenal diverticulum once again noted. No surrounding inflammation. 7. Colonic diverticulosis without evidence of diverticulitis. 8. Appendix not visualized and may have been removed at the time of hysterectomy. No right lower quadrant inflammatory process noted. 9. Moderate size right lumbar hernia containing fat and vessels. Small fat containing left inguinal hernia. 10.  Aortic Atherosclerosis (ICD10-I70.0). ____________________________________________  PROCEDURES  Procedures  Toradol 15 mg IVP Rocephin 1 g IVPB Tylenol 650 mg PO ____________________________________________  INITIAL IMPRESSION / ASSESSMENT AND PLAN / ED COURSE  Geriatric patient with ED  evaluation of right flank pain for the last 10 to 14 days.  Patient was recently treated for an acute pyelonephritis with Cipro.  She completed a course but continued to have ongoing left-sided flank pain.  Urinalysis and blood labs today are benign.  Urine culture is pending.  CT renal study does not reveal any acute obstructive process of the kidneys.  Patient be treated empirically for acute pyelonephritis with Keflex.  Initial IV dose of Rocephin is also provided.  She is encouraged to follow-up with her primary provider or return to the ED as needed. ____________________________________________  FINAL CLINICAL IMPRESSION(S) / ED DIAGNOSES  Final diagnoses:  Left flank pain  Acute pyelonephritis      Melvenia Needles, PA-C 11/12/18 1754    Nena Polio, MD 11/13/18 [phone]

## 2018-11-12 NOTE — ED Notes (Signed)
See triage note  Presents with left flank pain with started about 1-2 weeks ago  States pain was intermittent  Became worse today  States pain is non radiating   Denies any n/v/ or fever ambulates well to treatment area

## 2018-11-12 NOTE — ED Triage Notes (Signed)
Left flank pain since prior to christmas.  Today it is the worst.  Says she had a kidney infection about 7-9 days prior to christmas and finished just before christmas.  No urinary symptoms.  Does not think she has had a fever.

## 2018-11-13 ENCOUNTER — Ambulatory Visit: Payer: Medicare Other | Admitting: Family Medicine

## 2018-11-13 ENCOUNTER — Telehealth: Payer: Self-pay | Admitting: Family Medicine

## 2018-11-13 ENCOUNTER — Ambulatory Visit: Payer: Medicare Other | Admitting: Surgery

## 2018-11-13 NOTE — Telephone Encounter (Signed)
Please notify patient the following:  This is a common side effect of antibiotics. It can upset stomach. She can try OTC Gas-X, Pepto-bismol, or a Probiotic. Often yogurt like Activa that has probiotic in it can be helpful as well.  [name], [facility] 11/13/2018, 5:24 PM

## 2018-11-13 NOTE — Telephone Encounter (Signed)
Pt went to ER for UTI and got cethalexin and it's giving her gas.  Please call [phone]

## 2018-11-14 ENCOUNTER — Telehealth: Payer: Self-pay

## 2018-11-14 DIAGNOSIS — Z79891 Long term (current) use of opiate analgesic: Secondary | ICD-10-CM | POA: Diagnosis not present

## 2018-11-14 DIAGNOSIS — I214 Non-ST elevation (NSTEMI) myocardial infarction: Secondary | ICD-10-CM | POA: Diagnosis not present

## 2018-11-14 DIAGNOSIS — Z4789 Encounter for other orthopedic aftercare: Secondary | ICD-10-CM | POA: Diagnosis not present

## 2018-11-14 DIAGNOSIS — M170 Bilateral primary osteoarthritis of knee: Secondary | ICD-10-CM | POA: Diagnosis not present

## 2018-11-14 DIAGNOSIS — Z9181 History of falling: Secondary | ICD-10-CM | POA: Diagnosis not present

## 2018-11-14 DIAGNOSIS — J449 Chronic obstructive pulmonary disease, unspecified: Secondary | ICD-10-CM | POA: Diagnosis not present

## 2018-11-14 DIAGNOSIS — I10 Essential (primary) hypertension: Secondary | ICD-10-CM | POA: Diagnosis not present

## 2018-11-14 LAB — URINE CULTURE
Culture: NO GROWTH
Special Requests: NORMAL

## 2018-11-14 NOTE — Telephone Encounter (Signed)
[name] from [facility] reports patient not having bowel movement from past 2 weeks and as per Dr. Raliegh Ip she can take Miralax powder once a day and then increase alternate day for twice a day. [name] will inform patient.

## 2018-11-14 NOTE — Telephone Encounter (Signed)
Patient advised.

## 2018-11-18 DIAGNOSIS — Z9181 History of falling: Secondary | ICD-10-CM | POA: Diagnosis not present

## 2018-11-18 DIAGNOSIS — I10 Essential (primary) hypertension: Secondary | ICD-10-CM | POA: Diagnosis not present

## 2018-11-18 DIAGNOSIS — Z79891 Long term (current) use of opiate analgesic: Secondary | ICD-10-CM | POA: Diagnosis not present

## 2018-11-18 DIAGNOSIS — M170 Bilateral primary osteoarthritis of knee: Secondary | ICD-10-CM | POA: Diagnosis not present

## 2018-11-18 DIAGNOSIS — I214 Non-ST elevation (NSTEMI) myocardial infarction: Secondary | ICD-10-CM | POA: Diagnosis not present

## 2018-11-18 DIAGNOSIS — J449 Chronic obstructive pulmonary disease, unspecified: Secondary | ICD-10-CM | POA: Diagnosis not present

## 2018-11-18 DIAGNOSIS — Z4789 Encounter for other orthopedic aftercare: Secondary | ICD-10-CM | POA: Diagnosis not present

## 2018-11-19 ENCOUNTER — Encounter: Payer: Self-pay | Admitting: Surgery

## 2018-11-19 ENCOUNTER — Ambulatory Visit (INDEPENDENT_AMBULATORY_CARE_PROVIDER_SITE_OTHER): Payer: Medicare Other | Admitting: Surgery

## 2018-11-19 ENCOUNTER — Other Ambulatory Visit: Payer: Self-pay

## 2018-11-19 VITALS — BP 133/66 | HR 117 | Temp 98.0°F | Resp 16 | Ht <= 58 in | Wt 121.0 lb

## 2018-11-19 DIAGNOSIS — K458 Other specified abdominal hernia without obstruction or gangrene: Secondary | ICD-10-CM

## 2018-11-19 NOTE — Patient Instructions (Signed)
We will send the referral to Dr.Eric Wilson. You should receive a call from his office to schedule an appointment within 5-7 days. If you do not hear from anyone within the time frame listed above please call and let us know so we can check on this for you.

## 2018-11-19 NOTE — Progress Notes (Signed)
11/19/2018  HPI: Pamela Ball is a 73 y.o. female s/p laparoscopic incisional ventral hernia repair with mesh on 08/29/18.  She presents for follow up.  She's done well from the abdominal hernia standpoint.  She had back surgery with Dr. Rudene Christians on 11/18 and was admitted on 11/21 with possible NSTEMI and a fall.  She did not get any testing other than labs and echo done as she refused further workup for her heart.  She also had left sided pyelonephritis that was treated with outpatient regimen.  She presents to discuss her right lumbar hernia.  She is still having right lower back pain from her lumbar hernia.  She reports this is not the same pain that she had in her mid back that was operated on, as these are two different locations and pain and the right lumbar pain did not change after her surgery.  Vital signs: BP 133/66   Pulse (!) 117   Temp 98 F (36.7 C) (Oral)   Resp 16   Ht 4\' 10"  (1.473 m)   Wt 121 lb (54.9 kg)   SpO2 95%   BMI 25.29 kg/m    Physical Exam: Constitutional: No acute distress Back:  Small right lumbar hernia at the upper lumbar region on the right side.  Reducible but with tenderness to palpation and manipulation.  Assessment/Plan: This is a 73 y.o. female s/p laparoscopic incisional ventral hernia repair, now presenting to discuss a right lumbar hernia.  --Discussed with the patient that we do not have any experience treating lumbar hernias and as such, would be more advisable to refer her to a more experienced surgeon.  Discussed with Dr. Genevive Bi who recommended Dr. Greer Pickerel with [facility].  Will send a referral to him.  If he's not able to help due to location or type of hernia, then may have to refer to a [facility] like [place] or [place]. --From the abdominal standpoint, she's doing well and on recent CT scan from 11/12/18, I do not see any evidence of hernia recurrence.  The mesh that she has is still in place and there is clearly mesh visible in  her previous hernia site on the CT scan. --Otherwise may follow up prn.   Melvyn Neth, [facility]

## 2018-11-20 ENCOUNTER — Telehealth: Payer: Self-pay

## 2018-11-20 NOTE — Telephone Encounter (Signed)
Referral has been sent to Dr Greer Pickerel at [facility].

## 2018-11-21 DIAGNOSIS — Z4789 Encounter for other orthopedic aftercare: Secondary | ICD-10-CM | POA: Diagnosis not present

## 2018-11-21 DIAGNOSIS — Z9181 History of falling: Secondary | ICD-10-CM | POA: Diagnosis not present

## 2018-11-21 DIAGNOSIS — M170 Bilateral primary osteoarthritis of knee: Secondary | ICD-10-CM | POA: Diagnosis not present

## 2018-11-21 DIAGNOSIS — J449 Chronic obstructive pulmonary disease, unspecified: Secondary | ICD-10-CM | POA: Diagnosis not present

## 2018-11-21 DIAGNOSIS — I10 Essential (primary) hypertension: Secondary | ICD-10-CM | POA: Diagnosis not present

## 2018-11-21 DIAGNOSIS — Z79891 Long term (current) use of opiate analgesic: Secondary | ICD-10-CM | POA: Diagnosis not present

## 2018-11-21 DIAGNOSIS — I214 Non-ST elevation (NSTEMI) myocardial infarction: Secondary | ICD-10-CM | POA: Diagnosis not present

## 2018-11-26 ENCOUNTER — Telehealth: Payer: Self-pay

## 2018-11-26 NOTE — Telephone Encounter (Signed)
Patient called to let us know that she has not heard from [facility] about her referral. We faxed over the referral on 11/20/18. I have placed a call to the referral coordinator there to call us back to see what the status of the patient's referral is.

## 2018-12-05 ENCOUNTER — Other Ambulatory Visit: Payer: Self-pay | Admitting: Family Medicine

## 2018-12-05 DIAGNOSIS — E782 Mixed hyperlipidemia: Secondary | ICD-10-CM

## 2018-12-05 DIAGNOSIS — K449 Diaphragmatic hernia without obstruction or gangrene: Secondary | ICD-10-CM | POA: Diagnosis not present

## 2018-12-05 DIAGNOSIS — I252 Old myocardial infarction: Secondary | ICD-10-CM | POA: Diagnosis not present

## 2018-12-05 DIAGNOSIS — K458 Other specified abdominal hernia without obstruction or gangrene: Secondary | ICD-10-CM | POA: Diagnosis not present

## 2018-12-05 DIAGNOSIS — M545 Low back pain: Secondary | ICD-10-CM | POA: Diagnosis not present

## 2018-12-26 ENCOUNTER — Encounter: Payer: Self-pay | Admitting: Emergency Medicine

## 2018-12-26 ENCOUNTER — Other Ambulatory Visit: Payer: Self-pay

## 2018-12-26 ENCOUNTER — Emergency Department: Payer: Medicare Other

## 2018-12-26 ENCOUNTER — Inpatient Hospital Stay
Admission: EM | Admit: 2018-12-26 | Discharge: 2018-12-27 | DRG: 193 | Disposition: A | Discharge status: Home Health | Payer: Medicare Other | Attending: Internal Medicine | Admitting: Internal Medicine

## 2018-12-26 DIAGNOSIS — Z885 Allergy status to narcotic agent status: Secondary | ICD-10-CM | POA: Diagnosis not present

## 2018-12-26 DIAGNOSIS — K219 Gastro-esophageal reflux disease without esophagitis: Secondary | ICD-10-CM | POA: Diagnosis not present

## 2018-12-26 DIAGNOSIS — K449 Diaphragmatic hernia without obstruction or gangrene: Secondary | ICD-10-CM | POA: Diagnosis not present

## 2018-12-26 DIAGNOSIS — J449 Chronic obstructive pulmonary disease, unspecified: Secondary | ICD-10-CM | POA: Diagnosis present

## 2018-12-26 DIAGNOSIS — R509 Fever, unspecified: Secondary | ICD-10-CM | POA: Diagnosis not present

## 2018-12-26 DIAGNOSIS — J9601 Acute respiratory failure with hypoxia: Secondary | ICD-10-CM | POA: Diagnosis not present

## 2018-12-26 DIAGNOSIS — E440 Moderate protein-calorie malnutrition: Secondary | ICD-10-CM | POA: Diagnosis not present

## 2018-12-26 DIAGNOSIS — R059 Cough, unspecified: Secondary | ICD-10-CM

## 2018-12-26 DIAGNOSIS — Z791 Long term (current) use of non-steroidal anti-inflammatories (NSAID): Secondary | ICD-10-CM | POA: Diagnosis not present

## 2018-12-26 DIAGNOSIS — M170 Bilateral primary osteoarthritis of knee: Secondary | ICD-10-CM | POA: Diagnosis not present

## 2018-12-26 DIAGNOSIS — E785 Hyperlipidemia, unspecified: Secondary | ICD-10-CM | POA: Diagnosis not present

## 2018-12-26 DIAGNOSIS — Z6824 Body mass index (BMI) 24.0-24.9, adult: Secondary | ICD-10-CM

## 2018-12-26 DIAGNOSIS — I4510 Unspecified right bundle-branch block: Secondary | ICD-10-CM | POA: Diagnosis not present

## 2018-12-26 DIAGNOSIS — Z23 Encounter for immunization: Secondary | ICD-10-CM

## 2018-12-26 DIAGNOSIS — Z79899 Other long term (current) drug therapy: Secondary | ICD-10-CM

## 2018-12-26 DIAGNOSIS — R05 Cough: Secondary | ICD-10-CM

## 2018-12-26 DIAGNOSIS — F319 Bipolar disorder, unspecified: Secondary | ICD-10-CM | POA: Diagnosis present

## 2018-12-26 DIAGNOSIS — I10 Essential (primary) hypertension: Secondary | ICD-10-CM | POA: Diagnosis present

## 2018-12-26 DIAGNOSIS — F419 Anxiety disorder, unspecified: Secondary | ICD-10-CM | POA: Diagnosis present

## 2018-12-26 DIAGNOSIS — J441 Chronic obstructive pulmonary disease with (acute) exacerbation: Secondary | ICD-10-CM | POA: Diagnosis present

## 2018-12-26 DIAGNOSIS — J101 Influenza due to other identified influenza virus with other respiratory manifestations: Secondary | ICD-10-CM | POA: Diagnosis not present

## 2018-12-26 DIAGNOSIS — E869 Volume depletion, unspecified: Secondary | ICD-10-CM

## 2018-12-26 LAB — COMPREHENSIVE METABOLIC PANEL
ALT: 41 U/L (ref 0–44)
ANION GAP: 12 (ref 5–15)
AST: 61 U/L — ABNORMAL HIGH (ref 15–41)
Albumin: 4.5 g/dL (ref 3.5–5.0)
Alkaline Phosphatase: 123 U/L (ref 38–126)
BUN: 10 mg/dL (ref 8–23)
CHLORIDE: 100 mmol/L (ref 98–111)
CO2: 25 mmol/L (ref 22–32)
Calcium: 9.4 mg/dL (ref 8.9–10.3)
Creatinine, Ser: 0.57 mg/dL (ref 0.44–1.00)
GFR calc Af Amer: >60 mL/min (ref >60)
GFR calc non Af Amer: >60 mL/min (ref >60)
Glucose, Bld: 138 mg/dL — ABNORMAL HIGH (ref 70–99)
Potassium: 3.7 mmol/L (ref 3.5–5.1)
SODIUM: 137 mmol/L (ref 135–145)
Total Bilirubin: 0.6 mg/dL (ref 0.3–1.2)
Total Protein: 7.6 g/dL (ref 6.5–8.1)

## 2018-12-26 LAB — URINALYSIS, COMPLETE (UACMP) WITH MICROSCOPIC
Bacteria, UA: NONE SEEN
Bilirubin Urine: NEGATIVE
GLUCOSE, UA: NEGATIVE mg/dL
Hgb urine dipstick: NEGATIVE
KETONES UR: 5 mg/dL — AB
Leukocytes,Ua: NEGATIVE
Nitrite: NEGATIVE
Protein, ur: NEGATIVE mg/dL
Specific Gravity, Urine: 1.014 (ref 1.005–1.030)
Squamous Epithelial / HPF: NONE SEEN (ref 0–5)
pH: 7 (ref 5.0–8.0)

## 2018-12-26 LAB — LIPASE, BLOOD: Lipase: 24 U/L (ref 11–51)

## 2018-12-26 LAB — CBC WITH DIFFERENTIAL/PLATELET
Abs Immature Granulocytes: 0.01 10*3/uL (ref 0.00–0.07)
Basophils Absolute: 0 10*3/uL (ref 0.0–0.1)
Basophils Relative: 1 %
Eosinophils Absolute: 0 10*3/uL (ref 0.0–0.5)
Eosinophils Relative: 1 %
HCT: 47.9 % — ABNORMAL HIGH (ref 36.0–46.0)
Hemoglobin: 15.3 g/dL — ABNORMAL HIGH (ref 12.0–15.0)
IMMATURE GRANULOCYTES: 0 %
Lymphocytes Relative: 6 %
Lymphs Abs: 0.2 10*3/uL — ABNORMAL LOW (ref 0.7–4.0)
MCH: 31 pg (ref 26.0–34.0)
MCHC: 31.9 g/dL (ref 30.0–36.0)
MCV: 97 fL (ref 80.0–100.0)
Monocytes Absolute: 0.4 10*3/uL (ref 0.1–1.0)
Monocytes Relative: 11 %
NEUTROS PCT: 81 %
NRBC: 0 % (ref 0.0–0.2)
Neutro Abs: 3.4 10*3/uL (ref 1.7–7.7)
Platelets: 211 10*3/uL (ref 150–400)
RBC: 4.94 MIL/uL (ref 3.87–5.11)
RDW: 13.8 % (ref 11.5–15.5)
WBC: 4.2 10*3/uL (ref 4.0–10.5)

## 2018-12-26 LAB — BRAIN NATRIURETIC PEPTIDE: B Natriuretic Peptide: 25 pg/mL (ref 0.0–100.0)

## 2018-12-26 LAB — PROCALCITONIN: PROCALCITONIN: 0.1 ng/mL

## 2018-12-26 LAB — PROTIME-INR
INR: 0.95
Prothrombin Time: 12.6 seconds (ref 11.4–15.2)

## 2018-12-26 LAB — INFLUENZA PANEL BY PCR (TYPE A & B)
Influenza A By PCR: POSITIVE — AB
Influenza B By PCR: NEGATIVE

## 2018-12-26 LAB — LACTIC ACID, PLASMA: Lactic Acid, Venous: 1.8 mmol/L (ref 0.5–1.9)

## 2018-12-26 MED ORDER — BUSPIRONE HCL 10 MG PO TABS
10.0000 mg | ORAL_TABLET | Freq: Every day | ORAL | Status: DC
  Administered 2018-12-26 – 2018-12-27 (×2): 10 mg via ORAL
  Filled 2018-12-26: qty 2
  Filled 2018-12-26: qty 1

## 2018-12-26 MED ORDER — VITAMIN D 25 MCG (1000 UNIT) PO TABS
1000.0000 [IU] | ORAL_TABLET | Freq: Every day | ORAL | Status: DC
  Administered 2018-12-26 – 2018-12-27 (×2): 1000 [IU] via ORAL
  Filled 2018-12-26 (×3): qty 1

## 2018-12-26 MED ORDER — ENOXAPARIN SODIUM 40 MG/0.4ML ~~LOC~~ SOLN
40.0000 mg | SUBCUTANEOUS | Status: DC
  Administered 2018-12-26: 40 mg via SUBCUTANEOUS
  Filled 2018-12-26: qty 0.4

## 2018-12-26 MED ORDER — ACETAMINOPHEN 650 MG RE SUPP
650.0000 mg | Freq: Four times a day (QID) | RECTAL | Status: DC | PRN
  Filled 2018-12-26: qty 1

## 2018-12-26 MED ORDER — OSELTAMIVIR PHOSPHATE 75 MG PO CAPS
75.0000 mg | ORAL_CAPSULE | Freq: Once | ORAL | Status: AC
  Administered 2018-12-26: 75 mg via ORAL
  Filled 2018-12-26: qty 1

## 2018-12-26 MED ORDER — VITAMIN A 10000 UNITS PO CAPS
10000.0000 [IU] | ORAL_CAPSULE | Freq: Every day | ORAL | Status: DC
  Administered 2018-12-26 – 2018-12-27 (×2): 10000 [IU] via ORAL
  Filled 2018-12-26 (×2): qty 1

## 2018-12-26 MED ORDER — VITAMIN C 500 MG PO TABS
500.0000 mg | ORAL_TABLET | Freq: Every day | ORAL | Status: DC
  Administered 2018-12-26 – 2018-12-27 (×2): 500 mg via ORAL
  Filled 2018-12-26 (×2): qty 1

## 2018-12-26 MED ORDER — POLYETHYLENE GLYCOL 3350 17 G PO PACK
17.0000 g | PACK | Freq: Every day | ORAL | Status: DC | PRN
  Filled 2018-12-26: qty 1

## 2018-12-26 MED ORDER — METHYLPREDNISOLONE SODIUM SUCC 40 MG IJ SOLR
40.0000 mg | Freq: Two times a day (BID) | INTRAMUSCULAR | Status: DC
  Administered 2018-12-27: 40 mg via INTRAVENOUS
  Filled 2018-12-26 (×4): qty 1

## 2018-12-26 MED ORDER — SODIUM CHLORIDE 0.9 % IV BOLUS (SEPSIS)
1000.0000 mL | Freq: Once | INTRAVENOUS | Status: AC
  Administered 2018-12-26: 1000 mL via INTRAVENOUS

## 2018-12-26 MED ORDER — METOPROLOL SUCCINATE ER 25 MG PO TB24
25.0000 mg | ORAL_TABLET | Freq: Every day | ORAL | Status: DC
  Administered 2018-12-26 – 2018-12-27 (×2): 25 mg via ORAL
  Filled 2018-12-26: qty 1

## 2018-12-26 MED ORDER — OLANZAPINE 5 MG PO TABS
5.0000 mg | ORAL_TABLET | Freq: Every day | ORAL | Status: DC
  Administered 2018-12-26: 5 mg via ORAL
  Filled 2018-12-26 (×2): qty 1

## 2018-12-26 MED ORDER — OSELTAMIVIR PHOSPHATE 75 MG PO CAPS
75.0000 mg | ORAL_CAPSULE | Freq: Two times a day (BID) | ORAL | Status: DC
  Administered 2018-12-26: 20:00:00 75 mg via ORAL
  Filled 2018-12-26 (×2): qty 1

## 2018-12-26 MED ORDER — SIMVASTATIN 20 MG PO TABS
20.0000 mg | ORAL_TABLET | Freq: Every day | ORAL | Status: DC
  Administered 2018-12-26 – 2018-12-27 (×2): 20 mg via ORAL
  Filled 2018-12-26: qty 1
  Filled 2018-12-26: qty 2

## 2018-12-26 MED ORDER — SODIUM CHLORIDE 0.9 % IV BOLUS
500.0000 mL | Freq: Once | INTRAVENOUS | Status: AC
  Administered 2018-12-26: 500 mL via INTRAVENOUS

## 2018-12-26 MED ORDER — ONDANSETRON HCL 4 MG PO TABS
4.0000 mg | ORAL_TABLET | Freq: Four times a day (QID) | ORAL | Status: DC | PRN
  Filled 2018-12-26: qty 1

## 2018-12-26 MED ORDER — ACETAMINOPHEN 325 MG PO TABS
650.0000 mg | ORAL_TABLET | Freq: Once | ORAL | Status: AC
  Administered 2018-12-26: 650 mg via ORAL

## 2018-12-26 MED ORDER — VENLAFAXINE HCL ER 75 MG PO CP24
75.0000 mg | ORAL_CAPSULE | Freq: Every day | ORAL | Status: DC
  Administered 2018-12-27: 75 mg via ORAL
  Filled 2018-12-26: qty 1

## 2018-12-26 MED ORDER — ADULT MULTIVITAMIN W/MINERALS CH
1.0000 | ORAL_TABLET | Freq: Every day | ORAL | Status: DC
  Administered 2018-12-26 – 2018-12-27 (×2): 1 via ORAL
  Filled 2018-12-26 (×3): qty 1

## 2018-12-26 MED ORDER — GUAIFENESIN-DM 100-10 MG/5ML PO SYRP
5.0000 mL | ORAL_SOLUTION | ORAL | Status: DC | PRN
  Administered 2018-12-26 – 2018-12-27 (×3): 5 mL via ORAL
  Filled 2018-12-26 (×3): qty 5

## 2018-12-26 MED ORDER — ONDANSETRON HCL 4 MG/2ML IJ SOLN
4.0000 mg | Freq: Four times a day (QID) | INTRAMUSCULAR | Status: DC | PRN
  Filled 2018-12-26: qty 2

## 2018-12-26 MED ORDER — INFLUENZA VAC SPLIT HIGH-DOSE 0.5 ML IM SUSY
0.5000 mL | PREFILLED_SYRINGE | INTRAMUSCULAR | Status: AC
  Administered 2018-12-27: 14:00:00 0.5 mL via INTRAMUSCULAR
  Filled 2018-12-26: qty 0.5

## 2018-12-26 MED ORDER — LEVALBUTEROL HCL 0.63 MG/3ML IN NEBU
0.6300 mg | INHALATION_SOLUTION | Freq: Four times a day (QID) | RESPIRATORY_TRACT | Status: DC | PRN
  Filled 2018-12-26: qty 3

## 2018-12-26 MED ORDER — PANTOPRAZOLE SODIUM 40 MG PO TBEC
40.0000 mg | DELAYED_RELEASE_TABLET | Freq: Every day | ORAL | Status: DC
  Administered 2018-12-26 – 2018-12-27 (×2): 40 mg via ORAL
  Filled 2018-12-26 (×2): qty 1

## 2018-12-26 MED ORDER — ACETAMINOPHEN 325 MG PO TABS
650.0000 mg | ORAL_TABLET | Freq: Four times a day (QID) | ORAL | Status: DC | PRN
  Administered 2018-12-26: 650 mg via ORAL
  Filled 2018-12-26 (×3): qty 2

## 2018-12-26 MED ORDER — OXYCODONE-ACETAMINOPHEN 5-325 MG PO TABS
1.0000 | ORAL_TABLET | Freq: Four times a day (QID) | ORAL | Status: DC | PRN
  Administered 2018-12-26: 2 via ORAL
  Filled 2018-12-26: qty 2

## 2018-12-26 MED ORDER — BUPROPION HCL ER (XL) 150 MG PO TB24
150.0000 mg | ORAL_TABLET | Freq: Every day | ORAL | Status: DC
  Administered 2018-12-26 – 2018-12-27 (×2): 150 mg via ORAL
  Filled 2018-12-26 (×2): qty 1

## 2018-12-26 MED ORDER — ACETAMINOPHEN 325 MG PO TABS
ORAL_TABLET | ORAL | Status: AC
  Filled 2018-12-26: qty 2

## 2018-12-26 MED ORDER — SODIUM CHLORIDE 0.9 % IV SOLN: Freq: Once | INTRAVENOUS | Status: DC

## 2018-12-26 NOTE — ED Notes (Signed)
MD notified that patient refused steroids.  This RN discussed benefits of solumedrol with patient.  Will continue to monitor.

## 2018-12-26 NOTE — ED Notes (Signed)
Report to [place], RN

## 2018-12-26 NOTE — ED Notes (Signed)
Pt up to the bedside commode. Awaiting to give report. Pt has no other complaints. Pt unable to use the commode and put back into the bed.

## 2018-12-26 NOTE — Progress Notes (Signed)
   12/26/18 1700  Clinical Encounter Type  Visited With Patient and family together  Visit Type Initial  Referral From Nurse  Consult/Referral To [place]  [facility] entered contact precaution room. Patient was resting on bed. Lethargic. Fell asleep midway through visit. [place] built rapport with patient's significant other. Apparently, patient is already education on AD info but will like to complete document tomorrow. No other needs were shared. Pastoral care visit was appreciated.

## 2018-12-26 NOTE — H&P (Signed)
[facility] NAME: Pamela Ball    MR#:  [id number]  DATE OF BIRTH:  [birth year]  DATE OF ADMISSION:  12/26/2018  PRIMARY CARE PHYSICIAN: Olin Hauser, DO   REQUESTING/REFERRING PHYSICIAN: Dr. Burlene Arnt  CHIEF COMPLAINT:   Fever, cough and generalized malaise HISTORY OF PRESENT ILLNESS:  Pamela Ball  is a 73 y.o. female with a known history of COPD not on oxygen and bipolar 1 disorder who presents emergency room due to fever, cough, malaise and body aches.  Patient reports since yesterday she has had these symptoms and her symptoms have worsened overnight. She also endorses wheezing.  She does not use oxygen at home.  While in the emergency room she is found to have influenza A.  Chest x-ray shows no evidence of pneumonia.  She also is requiring oxygen.  Her oxygen saturation drops in the 70s when she ambulates.  PAST MEDICAL HISTORY:   Past Medical History:  Diagnosis Date  . Abdominal hernia with obstruction and without gangrene   . Anxiety 02/15/2017  . Bipolar 1 disorder, depressed, full remission ([place]) 02/15/2017  . Bipolar affective disorder ([place])   . COPD (chronic obstructive pulmonary disease) ([place]) 02/15/2017  . Depression   . Dyspnea   . GERD (gastroesophageal reflux disease) 02/15/2017  . Glaucoma   . Hiatal hernia   . History of abdominal hernia 05/21/2017  . History of compression fracture of spine 02/15/2017  . Hyperlipidemia   . Incisional hernia   . Incisional ventral hernia w obstruction 06/02/2017  . Normocytic anemia 05/23/2017  . 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 Presbycusis of both ears 02/15/2017    PAST SURGICAL HISTORY:   Past Surgical History:  Procedure Laterality Date  . ABDOMINAL HYSTERECTOMY  11/06/2006  . CESAREAN SECTION     x3  . COLONOSCOPY WITH PROPOFOL N/A 06/25/2018   Procedure: COLONOSCOPY WITH PROPOFOL;  Surgeon: Jonathon Bellows, MD;  Location: [facility] ENDOSCOPY;   Service: Endoscopy;  Laterality: N/A;  . KYPHOPLASTY    . KYPHOPLASTY N/A 09/23/2018   Procedure: KYPHOPLASTY T10;  Surgeon: Hessie Knows, MD;  Location: [facility] ORS;  Service: Orthopedics;  Laterality: N/A;  . VENTRAL HERNIA REPAIR N/A 06/02/2017   Procedure: exploratory laparotomy repair of incarcerated  VENTRAL hernia;  Surgeon: Florene Glen, MD;  Location: [facility] ORS;  Service: General;  Laterality: N/A;  . VENTRAL HERNIA REPAIR N/A 08/29/2018   Procedure: LAPAROSCOPIC INCISIONAL HERNIA;  Surgeon: Olean Ree, MD;  Location: [facility] ORS;  Service: General;  Laterality: N/A;    SOCIAL HISTORY:   Social History   Tobacco Use  . Smoking status: Never Smoker  . Smokeless tobacco: Never Used  Substance Use Topics  . Alcohol use: No    FAMILY HISTORY:   Family History  Problem Relation Age of Onset  . Cancer Mother        cancer  . Breast cancer Mother 22    DRUG ALLERGIES:   Allergies  Allergen Reactions  . Codeine Other (See Comments)    Reaction: unknown Hasn't had a reaction in years    REVIEW OF SYSTEMS:   Review of Systems  Constitutional: Positive for malaise/fatigue. Negative for chills and fever.  HENT: Negative.  Negative for ear discharge, ear pain, hearing loss, nosebleeds and sore throat.   Eyes: Negative.  Negative for blurred vision and pain.  Respiratory: Positive for cough, shortness of breath and wheezing. Negative for hemoptysis.   Cardiovascular: Negative.  Negative for chest pain, palpitations and leg swelling.  Gastrointestinal: Negative.  Negative for abdominal pain, blood in stool, diarrhea, nausea and vomiting.  Genitourinary: Negative.  Negative for dysuria.  Musculoskeletal: Negative.  Negative for back pain.  Skin: Negative.   Neurological: Negative for dizziness, tremors, speech change, focal weakness, seizures and headaches.  Endo/Heme/Allergies: Negative.  Does not bruise/bleed easily.  Psychiatric/Behavioral: Negative.  Negative for  depression, hallucinations and suicidal ideas.    MEDICATIONS AT HOME:   Prior to Admission medications   Medication Sig Start Date End Date Taking? Authorizing Provider  buPROPion (WELLBUTRIN XL) 150 MG 24 hr tablet Take 150 mg by mouth daily.  01/11/15  Yes [provider]  busPIRone (BUSPAR) 10 MG tablet Take 1 tablet (10 mg total) by mouth daily. Prescribed by Psychiatry Dr Kasandra Knudsen 11/14/17  Yes Karamalegos, Devonne Doughty, DO  cholecalciferol (VITAMIN D) 1000 units tablet Take 1,000 Units by mouth daily.   Yes [provider]  diclofenac sodium (VOLTAREN) 1 % GEL Apply 4 g topically 4 (four) times daily. 09/19/18  Yes Triplett, Cari B, FNP  metoprolol succinate (TOPROL-XL) 25 MG 24 hr tablet Take 1 tablet (25 mg total) by mouth daily. 09/27/18 12/26/18 Yes Henreitta Leber, MD  Multiple Vitamin (MULTI-VITAMINS) TABS Take 1 tablet by mouth daily.    Yes [provider]  OLANZapine (ZYPREXA) 5 MG tablet Take 5 mg by mouth at bedtime.  01/11/15  Yes [provider]  omeprazole (PRILOSEC) 20 MG capsule Take 1 capsule (20 mg total) by mouth daily before breakfast. 05/14/18  Yes Karamalegos, Devonne Doughty, DO  simvastatin (ZOCOR) 20 MG tablet TAKE 1 TABLET BY MOUTH EVERY DAY 12/05/18  Yes Karamalegos, Devonne Doughty, DO  venlafaxine XR (EFFEXOR-XR) 75 MG 24 hr capsule Take 75 mg by mouth daily with breakfast.  01/11/15  Yes [provider]  vitamin A 10000 UNIT capsule Take 10,000 Units by mouth daily.   Yes [provider]  vitamin C (ASCORBIC ACID) 500 MG tablet Take 500 mg by mouth daily.   Yes [provider]      VITAL SIGNS:  Blood pressure (!) 165/71, pulse (!) 109, temperature 100.3 F (37.9 C), temperature source Oral, resp. rate (!) 22, height 4\' 10"  (1.473 m), weight 55.3 kg, SpO2 94 %.  PHYSICAL EXAMINATION:   Physical Exam Constitutional:      General: She is not in acute distress. HENT:     Head: Normocephalic.  Eyes:     General:  No scleral icterus. Neck:     Musculoskeletal: Normal range of motion and neck supple.     Vascular: No JVD.     Trachea: No tracheal deviation.  Cardiovascular:     Rate and Rhythm: Normal rate and regular rhythm.     Heart sounds: Normal heart sounds. No murmur. No friction rub. No gallop.   Pulmonary:     Effort: Pulmonary effort is normal. No respiratory distress.     Breath sounds: Wheezing present. No rales.  Chest:     Chest wall: No tenderness.  Abdominal:     General: Bowel sounds are normal. There is no distension.     Palpations: Abdomen is soft. There is no mass.     Tenderness: There is no abdominal tenderness. There is no guarding or rebound.  Musculoskeletal: Normal range of motion.  Skin:    General: Skin is warm.     Findings: No erythema or rash.  Neurological:     Mental  Status: She is alert and oriented to person, place, and time.  Psychiatric:        Judgment: Judgment normal.       LABORATORY PANEL:   CBC Recent Labs  Lab 12/26/18 0621  WBC 4.2  HGB 15.3*  HCT 47.9*  PLT 211   ------------------------------------------------------------------------------------------------------------------  Chemistries  Recent Labs  Lab 12/26/18 0621  NA 137  K 3.7  CL 100  CO2 25  GLUCOSE 138*  BUN 10  CREATININE 0.57  CALCIUM 9.4  AST 61*  ALT 41  ALKPHOS 123  BILITOT 0.6   ------------------------------------------------------------------------------------------------------------------  Cardiac Enzymes No results for input(s): TROPONINI in the last 168 hours. ------------------------------------------------------------------------------------------------------------------  RADIOLOGY:  Dg Chest 2 View  Result Date: 12/26/2018 CLINICAL DATA:  Flu-like symptoms. EXAM: CHEST - 2 VIEW COMPARISON:  09/26/2018. FINDINGS: Mediastinum hilar structures normal. Heart size normal. Low lung volumes. No focal infiltrate. No pleural effusion or  pneumothorax. Degenerative change thoracic spine with kyphoscoliosis again noted. Multiple prior vertebroplasties. Old healed fracture right humerus. 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 Similar findings noted on prior exam. Sliding hiatal hernia. IMPRESSION: Low lung volumes.  Otherwise no acute cardiopulmonary disease. Electronically Signed   By: Marcello Moores  Register   On: 12/26/2018 07:50    EKG:  Sinus tachycardia heart rate 119 without ST elevation or depression  IMPRESSION AND PLAN:   73 year old female with history of COPD who presents with shortness of breath, malaise and body aches.  1.  Acute hypoxic respiratory failure in the setting of mild COPD exacerbation and influenza A. Wean oxygen to room air as tolerated  2.  Influenza A: Continue Tamiflu for total 5 days. Continue isolation  3.  Mild COPD exacerbation: Start Solu-Medrol 40 mg IV every 12 hours and taper as tolerated. Continue nebs and inhaler.  4.  Essential hypertension: Continue metoprolol  5.  Bipolar: Continue Effexor, Zyprexa, Wellbutrin and BuSpar  6.  Hyperlipidemia: Continue Zocor     All the records are reviewed and case discussed with ED provider. Management plans discussed with the patient and she is in agreement  CODE STATUS: Full  TOTAL TIME TAKING CARE OF THIS PATIENT: 44 minutes.    Matha Masse M.D on 12/26/2018 at 10:36 AM  Between 7am to 6pm - Pager - [phone]  After 6pm go to [email or web address] - password [email or web address] [facility]  Office  [phone]  CC: Primary care physician; Olin Hauser, DO

## 2018-12-26 NOTE — ED Provider Notes (Signed)
-----------------------------------------   9:21 AM on 12/26/2018 -----------------------------------------  She does not appear septic but she does have the flu, her heart rate is still up after a liter of fluid, and she does desat to the high 80s when she walks.  We will admit.  We will give her Tamiflu.   Schuyler Amor, MD 12/26/18 [phone]

## 2018-12-26 NOTE — ED Notes (Signed)
Patient transported to X-ray 

## 2018-12-26 NOTE — ED Notes (Signed)
ED Provider at bedside. 

## 2018-12-26 NOTE — ED Notes (Addendum)
Pt O2sat was 89%, placed pt on 2L nasal cannula for comfort.

## 2018-12-26 NOTE — ED Notes (Signed)
Up to bedside commode to void

## 2018-12-26 NOTE — ED Provider Notes (Signed)
Pamela Ball Emergency Department Provider Note  ____________________________________________   First MD Initiated Contact with Patient 12/26/18 [phone]     (approximate)  I have reviewed the triage vital signs and the nursing notes.   HISTORY  Chief Complaint Fever and Cough    HPI Pamela Ball is a 73 y.o. female with extensive medical history as listed below which notably includes COPD with no oxygen requirement at home.  She presents by private vehicle for evaluation of gradually worsening cough, chills, body aches, and some associated shortness of breath over the last 2 days.  She is concerned she may have influenza.  Her husband has not had any symptoms but her son has been ill recently.  The shortness of breath is only associated with the cough and is not at baseline but she is also had some wheezing.  She does not smoke or have a history of smoking but she has been exposed to smokers throughout her life.  She does not use supplemental oxygen at baseline.  She has had some chills as well as subjective fever.  She has not been eating or drinking very much over the last couple of days.  She denies chest pain, vomiting, and abdominal pain although she has had some nausea.  She denies dysuria.  She describes her symptoms as severe nothing in particular makes them better and they are worse with exertion.  Past Medical History:  Diagnosis Date  . Abdominal hernia with obstruction and without gangrene   . Anxiety 02/15/2017  . Bipolar 1 disorder, depressed, full remission ([place]) 02/15/2017  . Bipolar affective disorder ([place])   . COPD (chronic obstructive pulmonary disease) ([place]) 02/15/2017  . Depression   . Dyspnea   . GERD (gastroesophageal reflux disease) 02/15/2017  . Glaucoma   . Hiatal hernia   . History of abdominal hernia 05/21/2017  . History of compression fracture of spine 02/15/2017  . Hyperlipidemia   . Incisional hernia   . Incisional ventral  hernia w obstruction 06/02/2017  . Normocytic anemia 05/23/2017  . 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 Presbycusis of both ears 02/15/2017    Patient Active Problem List   Diagnosis Date Noted  . Lumbar hernia 11/19/2018  . Elevated troponin 09/26/2018  . Incisional hernia, without obstruction or gangrene 08/28/2018  . Allergic rhinitis due to allergen 05/14/2018  . Abdominal hernia with obstruction and without gangrene   . Normocytic anemia 05/23/2017  . History of abdominal hernia 05/21/2017  . Bipolar 1 disorder, depressed, full remission ([place]) 02/15/2017  . GERD (gastroesophageal reflux disease) 02/15/2017  . COPD (chronic obstructive pulmonary disease) ([place]) 02/15/2017  . 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 Hyperlipidemia 02/15/2017  . Presbycusis of both ears 02/15/2017  . History of compression fracture of spine 02/15/2017  . Anxiety 02/15/2017    Past Surgical History:  Procedure Laterality Date  . ABDOMINAL HYSTERECTOMY  11/06/2006  . CESAREAN SECTION     x3  . COLONOSCOPY WITH PROPOFOL N/A 06/25/2018   Procedure: COLONOSCOPY WITH PROPOFOL;  Surgeon: Jonathon Bellows, MD;  Location: [facility] ENDOSCOPY;  Service: Endoscopy;  Laterality: N/A;  . KYPHOPLASTY    . KYPHOPLASTY N/A 09/23/2018   Procedure: KYPHOPLASTY T10;  Surgeon: Hessie Knows, MD;  Location: [facility] ORS;  Service: Orthopedics;  Laterality: N/A;  . VENTRAL HERNIA REPAIR N/A 06/02/2017   Procedure: exploratory laparotomy repair of incarcerated  VENTRAL hernia;  Surgeon: Florene Glen, MD;  Location: [facility] ORS;  Service: General;  Laterality: N/A;  . VENTRAL HERNIA REPAIR N/A 08/29/2018   Procedure: LAPAROSCOPIC INCISIONAL HERNIA;  Surgeon: Olean Ree, MD;  Location: [facility] ORS;  Service: General;  Laterality: N/A;    Prior to Admission medications   Medication Sig Start Date End Date Taking? Authorizing Provider  buPROPion (WELLBUTRIN XL) 150 MG 24 hr tablet Take 150 mg by mouth daily.   01/11/15   [provider]  busPIRone (BUSPAR) 10 MG tablet Take 1 tablet (10 mg total) by mouth daily. Prescribed by Psychiatry Dr Kasandra Knudsen 11/14/17   Parks Ranger, Devonne Doughty, DO  cholecalciferol (VITAMIN D) 1000 units tablet Take 1,000 Units by mouth daily.    [provider]  diclofenac sodium (VOLTAREN) 1 % GEL Apply 4 g topically 4 (four) times daily. 09/19/18   Triplett, Johnette Abraham B, FNP  metoprolol succinate (TOPROL-XL) 25 MG 24 hr tablet Take 1 tablet (25 mg total) by mouth daily. 09/27/18 11/26/18  Henreitta Leber, MD  Multiple Vitamin (MULTI-VITAMINS) TABS Take 1 tablet by mouth daily.     [provider]  Multiple Vitamins-Minerals (MULTIVITAMIN ADULT PO) Take by mouth.    [provider]  OLANZapine (ZYPREXA) 5 MG tablet Take 5 mg by mouth at bedtime.  01/11/15   [provider]  omeprazole (PRILOSEC) 20 MG capsule Take 1 capsule (20 mg total) by mouth daily before breakfast. 05/14/18   Karamalegos, Devonne Doughty, DO  ondansetron (ZOFRAN ODT) 4 MG disintegrating tablet Take 1 tablet (4 mg total) by mouth every 8 (eight) hours as needed. 11/12/18   Menshew, Dannielle Karvonen, PA-C  simvastatin (ZOCOR) 20 MG tablet TAKE 1 TABLET BY MOUTH EVERY DAY 12/05/18   Parks Ranger, Devonne Doughty, DO  venlafaxine XR (EFFEXOR-XR) 75 MG 24 hr capsule Take 75 mg by mouth daily with breakfast.  01/11/15   [provider]  vitamin A 10000 UNIT capsule Take 10,000 Units by mouth daily.    [provider]  vitamin C (ASCORBIC ACID) 500 MG tablet Take 500 mg by mouth daily.    [provider]    Allergies Codeine  Family History  Problem Relation Age of Onset  . Cancer Mother        cancer  . Breast cancer Mother 41    Social History Social History   Tobacco Use  . Smoking status: Never Smoker  . Smokeless tobacco: Never Used  Substance Use Topics  . Alcohol use: No  . Drug use: No    Review of Systems Constitutional: +fever/chills, general  malaise Eyes: No visual changes. ENT: +sore throat. Cardiovascular: Denies chest pain. Respiratory: Cough, some shortness of breath associated with the cough.  Intermittent wheezing. Gastrointestinal: No abdominal pain.  Nausea, no vomiting.  No diarrhea.  No constipation. Genitourinary: Negative for dysuria. Musculoskeletal: Negative for neck pain.  Negative for back pain. Integumentary: Negative for rash. Neurological: Negative for headaches, focal weakness or numbness.   ____________________________________________   PHYSICAL EXAM:  VITAL SIGNS: ED Triage Vitals  Enc Vitals Group     BP 12/26/18 0602 (!) 182/75     Pulse Rate 12/26/18 0602 (!) 126     Resp 12/26/18 0602 20     Temp 12/26/18 0602 99.8 F (37.7 C)     Temp Source 12/26/18 0602 Oral     SpO2 12/26/18 0602 92 %     Weight 12/26/18 0604 55.3 kg (122 lb)     Height 12/26/18 0604 1.473 m (4\' 10" )     Head Circumference --  Peak Flow --      Pain Score 12/26/18 0604 10     Pain Loc --      Pain Edu? --      Excl. in [place]? --     Constitutional: Alert and oriented.  Elderly, appears chronically ill, no acute distress but does appear uncomfortable. Eyes: Conjunctivae are normal.  Head: Atraumatic. Nose: No congestion/rhinnorhea. Mouth/Throat: Mucous membranes are dry.  Oropharynx is non-erythematous and there is no exudate. Neck: No stridor.  No meningeal signs.   Cardiovascular: Tachycardia at about 130, regular rhythm. Good peripheral circulation. Grossly normal heart sounds. Respiratory: Normal respiratory effort.  No retractions.  She has some coarse breath sounds most notable in the bases but no wheezing at this time. Gastrointestinal: Soft and nontender. No distention.  Musculoskeletal: No lower extremity tenderness nor edema. No gross deformities of extremities. Neurologic:  Normal speech and language. No gross focal neurologic deficits are appreciated.  Skin:  Skin is warm, dry and intact. No rash  noted. Psychiatric: Mood and affect are normal. Speech and behavior are normal.  ____________________________________________   LABS (all labs ordered are listed, but only abnormal results are displayed)  Labs Reviewed  COMPREHENSIVE METABOLIC PANEL - Abnormal; Notable for the following components:      Result Value   Glucose, Bld 138 (*)    AST 61 (*)    All other components within normal limits  CBC WITH DIFFERENTIAL/PLATELET - Abnormal; Notable for the following components:   Hemoglobin 15.3 (*)    HCT 47.9 (*)    Lymphs Abs 0.2 (*)    All other components within normal limits  CULTURE, BLOOD (ROUTINE X 2)  CULTURE, BLOOD (ROUTINE X 2)  URINE CULTURE  LACTIC ACID, PLASMA  LIPASE, BLOOD  BRAIN NATRIURETIC PEPTIDE  INFLUENZA PANEL BY PCR (TYPE A & B)  LACTIC ACID, PLASMA  PROCALCITONIN  URINALYSIS, COMPLETE (UACMP) WITH MICROSCOPIC  PROTIME-INR   ____________________________________________  EKG  ED ECG REPORT I, Hinda Kehr, the attending physician, personally viewed and interpreted this ECG.  Date: 12/26/2018 EKG Time: 6:15 AM Rate: 119 Rhythm: Sinus tachycardia QRS Axis: normal Intervals: Right bundle branch block ST/T Wave abnormalities: Non-specific ST segment / T-wave changes, but no clear evidence of acute ischemia. Narrative Interpretation: no definitive evidence of acute ischemia; does not meet STEMI criteria.   ____________________________________________  RADIOLOGY   ED MD interpretation:  Chest x-ray pending at signout  Official radiology report(s): No results found.  ____________________________________________   PROCEDURES  Critical Care performed: No   Procedure(s) performed:   Procedures   ____________________________________________   INITIAL IMPRESSION / ASSESSMENT AND PLAN / ED COURSE  As part of my medical decision making, I reviewed the following data within the [place] notes reviewed and  incorporated, Labs reviewed , EKG interpreted , Old chart reviewed, Patient signed out to Dr. Burlene Arnt,  and Notes from prior ED visits    Differential diagnosis includes, but is not limited to, influenza, pneumonia, COPD exacerbation, bacteremia, sepsis.  Although technically the patient does meet sepsis criteria, given the prevalence in the community I think it is very likely that she has influenza but not an acute bacterial infection or severe sepsis.  She has only had symptoms for 2 days which is reassuring from the perspective of a persistent illness such as pneumonia.  Her lung sounds are relatively reassuring at this time.  Although she is tachycardic and febrile and tachypneic, I think this could be all secondary  to influenza and she may not necessarily require admission.  She reports that she is not short of breath, it is mostly the cough that is bothering her.  I am giving her a liter of normal saline to help with the tachycardia and insensible losses as well as decreased oral intake.  Comprehensive metabolic panel is generally reassuring except for a very slightly elevated AST.  She has normal creatinine, normal lactic acid, normal lipase, and normal BNP.  CBC is normal except for some hemoconcentration which is likely secondary to volume depletion.  Blood cultures and pro calcitonin are pending, as are chest x-ray and influenza panel.  I signed out the patient to Dr. Burlene Arnt to follow-up on the results and to reassess to see how she is doing after the fluids to determine the appropriate disposition.     ____________________________________________  FINAL CLINICAL IMPRESSION(S) / ED DIAGNOSES  Final diagnoses:  Cough  Fever, unspecified fever cause  Volume depletion     MEDICATIONS GIVEN DURING THIS VISIT:  Medications  sodium chloride 0.9 % bolus 1,000 mL (1,000 mLs Intravenous New Bag/Given 12/26/18 0651)  acetaminophen (TYLENOL) tablet 650 mg (650 mg Oral Given 12/26/18 0709)      ED Discharge Orders    None       Note:  This document was prepared using Dragon voice recognition software and may include unintentional dictation errors.   Hinda Kehr, MD 12/26/18 [phone]

## 2018-12-26 NOTE — ED Triage Notes (Signed)
Patient thinks she has the flu. Symptoms started yesterday with cough, body aches, headache, fever.

## 2018-12-26 NOTE — Progress Notes (Signed)
Family Meeting Note  Advance Directive:no  Today a meeting took place with the Patient.spouse  The following clinical team members were present during this meeting:MD  The following were discussed:Patient's diagnosis: Acute hypoxic respiratory failure in the setting influenza mild COPD exacerbation, Patient's progosis: > 12 months and Goals for treatment: Full Code  Additional follow-up to be provided: Chaplain consulted to  create advanced directives  Time spent during discussion: 16 minutes  Pamela Uzelac, MD

## 2018-12-26 NOTE — Progress Notes (Addendum)
Pt refused. Tamiflu and solumedrol. Education provided on medication. Pt agreed to take Tamiflu. Pt refusal to take solumedrol stands.Pt is complaining of pain of 8 on a 0-10 pain scale.  MD contacted. Awaiting response. Will continue to monitor. MD ordered medication, see mar. Medication administered to pt. Will continue to monitor.

## 2018-12-26 NOTE — ED Notes (Signed)
Pt 89% on RA. Placed on 2L [place], 95% at this time. Pt becomes SOB with ambulation to and from bathroom.

## 2018-12-27 DIAGNOSIS — E440 Moderate protein-calorie malnutrition: Secondary | ICD-10-CM

## 2018-12-27 DIAGNOSIS — Z23 Encounter for immunization: Secondary | ICD-10-CM | POA: Diagnosis not present

## 2018-12-27 LAB — CBC
HCT: 44.2 % (ref 36.0–46.0)
Hemoglobin: 14.4 g/dL (ref 12.0–15.0)
MCH: 32 pg (ref 26.0–34.0)
MCHC: 32.6 g/dL (ref 30.0–36.0)
MCV: 98.2 fL (ref 80.0–100.0)
Platelets: 175 10*3/uL (ref 150–400)
RBC: 4.5 MIL/uL (ref 3.87–5.11)
RDW: 13.5 % (ref 11.5–15.5)
WBC: 3.6 10*3/uL — ABNORMAL LOW (ref 4.0–10.5)
nRBC: 0 % (ref 0.0–0.2)

## 2018-12-27 LAB — BASIC METABOLIC PANEL
ANION GAP: 11 (ref 5–15)
BUN: 11 mg/dL (ref 8–23)
CO2: 25 mmol/L (ref 22–32)
Calcium: 8.4 mg/dL — ABNORMAL LOW (ref 8.9–10.3)
Chloride: 97 mmol/L — ABNORMAL LOW (ref 98–111)
Creatinine, Ser: 0.61 mg/dL (ref 0.44–1.00)
GFR calc Af Amer: >60 mL/min (ref >60)
GFR calc non Af Amer: >60 mL/min (ref >60)
Glucose, Bld: 75 mg/dL (ref 70–99)
Potassium: 3.6 mmol/L (ref 3.5–5.1)
Sodium: 133 mmol/L — ABNORMAL LOW (ref 135–145)

## 2018-12-27 LAB — URINE CULTURE: Culture: NO GROWTH

## 2018-12-27 MED ORDER — PNEUMOCOCCAL VAC POLYVALENT 25 MCG/0.5ML IJ INJ: 0.5000 mL | INJECTION | INTRAMUSCULAR | Status: DC

## 2018-12-27 MED ORDER — PNEUMOCOCCAL VAC POLYVALENT 25 MCG/0.5ML IJ INJ
0.5000 mL | INJECTION | INTRAMUSCULAR | Status: DC
  Filled 2018-12-27: qty 0.5

## 2018-12-27 MED ORDER — OSELTAMIVIR PHOSPHATE 30 MG PO CAPS: 30.0000 mg | ORAL_CAPSULE | Freq: Two times a day (BID) | ORAL | 0 refills | Status: DC

## 2018-12-27 MED ORDER — OSELTAMIVIR PHOSPHATE 30 MG PO CAPS
30.0000 mg | ORAL_CAPSULE | Freq: Two times a day (BID) | ORAL | Status: DC
  Administered 2018-12-27: 11:00:00 30 mg via ORAL
  Filled 2018-12-27: qty 1

## 2018-12-27 NOTE — Progress Notes (Signed)
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  GOAL:   Patient will meet greater than or equal to 90% of their needs  MONITOR:   PO intake, Supplement acceptance, Labs, Weight trends  REASON FOR ASSESSMENT:   Malnutrition Screening Tool    ASSESSMENT:   73 year old female who presented to the ED on 2/20 with a fever and cough. PMH significant for COPD, bipolar disorder, anxiety, depression, GERD, HLD. Pt admitted with influenza A.   Spoke with pt and significant other at bedside. Pt reports that her appetite has been decreased since Monday but that "I've been forcing myself to eat some." Pt reports that when she feels good, she eats 3 meals daily. Over the last week, however, she has only been eating 1-2 meals daily.  Breakfast usually includes cereal and yogurt. Pt typically eats a hotdog for lunch and [employer] chicken or [employer] for dinner. Pt usually drinks water, Gatorade, milk, tea, and juice at home.  Pt states that she ate "okay" at breakfast and was able to eat the oatmeal and fruit cup. Pt denies any N/V at this time.  Pt reports that she she not like oral nutrition supplements of any kind but is willing to try Magic Cup ice cream with meals. RD ordered via [place]. Also recommend liberalizing diet to Regular to promote PO intake during admission.  Pt states that she has lost some weight since her surgery in November. Pt states that her appetite was also decreased for a while after surgery. RD obtained  bed weight and recorded in chart: 118.2 lbs. Pt reports her UBW as "either 121 or 131 lbs." Per weight history in chart, pt with 7.5 kg weight loss since 08/28/18. This is a 12.3% weight loss in 4 months which is significant for timeframe.  Medications reviewed and include: cholecalciferol, MVI with minerals, Tamiflu, Protonix, vitamin A, vitamin C  Labs reviewed: sodium 133 (L)  NUTRITION - FOCUSED PHYSICAL EXAM:    Most Recent Value  Orbital Region  Mild depletion  Upper Arm Region  No depletion  Thoracic and Lumbar Region  Mild depletion  Buccal Region  No depletion  Temple Region  Mild depletion  Clavicle Bone Region  Mild depletion  Clavicle and Acromion Bone Region  Mild depletion  Scapular Bone Region  No depletion  Dorsal Hand  Mild depletion  Patellar Region  Mild depletion  Anterior Thigh Region  Moderate depletion  Posterior Calf Region  Moderate depletion  Edema (RD Assessment)  Moderate [BLE]  Hair  Reviewed  Eyes  Reviewed  Mouth  Reviewed  Skin  Reviewed  Nails  Reviewed       Diet Order:   Diet Order            Diet regular Room service appropriate? Yes; Fluid consistency: Thin  Diet effective now              EDUCATION NEEDS:   Education needs have been addressed  Skin:  Skin Assessment: Reviewed RN Assessment  Last BM:  2/20 small type 6  Height:   Ht Readings from Last 1 Encounters:  12/26/18 4\' 10"  (1.473 m)    Weight:   Wt Readings from Last 1 Encounters:  12/27/18 53.6 kg    Ideal Body Weight:  40.9 kg  BMI:  Body mass index is 24.7 kg/m.  Estimated Nutritional Needs:   Kcal:  1300-1500  Protein:  65-80 grams  Fluid:  1.3-1.5 L    Gaynell Face, MS, RD, LDN Inpatient Clinical Dietitian Pager: [phone] Weekend/After Hours: [phone]

## 2018-12-27 NOTE — Progress Notes (Signed)
Patient discharged home per MD order. All discharge instructions given and all questions answered. 

## 2018-12-27 NOTE — Discharge Instructions (Signed)
It was so nice to meet you during this hospitalization!  You came into the hospital with weakness. We found that you have the flu.  I have prescribed the following medications: 1. Please take Tamiflu 30mg  twice a day- take 1 tablet tonight and then continue for 3 more days.  Take care, Dr. Brett Albino

## 2018-12-27 NOTE — Progress Notes (Signed)
   12/27/18 0900  Clinical Encounter Type  Visited With Patient and family together  Visit Type Follow-up  Ch made a follow-up visit on her AD. Pt said she wasn't ready for it yet. Ch let her know that ch is available whenever she gets ready. Pt was with her husband.

## 2018-12-27 NOTE — Discharge Summary (Signed)
[place] [facility] [place] NAME: Pamela Ball    MR#:  [id number]  DATE OF BIRTH:  1946/07/30  DATE OF ADMISSION:  12/26/2018   ADMITTING PHYSICIAN: Bettey Costa, MD  DATE OF DISCHARGE: 12/27/18  PRIMARY CARE PHYSICIAN: Olin Hauser, DO   ADMISSION DIAGNOSIS:  Cough [R05] Volume depletion [E86.9] Fever, unspecified fever cause [R50.9] COPD exacerbation (HCC) [J44.1] DISCHARGE DIAGNOSIS:  Active Problems:   COPD exacerbation (HCC)   Malnutrition of moderate degree  SECONDARY DIAGNOSIS:   Past Medical History:  Diagnosis Date  . Abdominal hernia with obstruction and without gangrene   . Anxiety 02/15/2017  . Bipolar 1 disorder, depressed, full remission ([place]) 02/15/2017  . Bipolar affective disorder ([place])   . COPD (chronic obstructive pulmonary disease) ([place]) 02/15/2017  . Depression   . Dyspnea   . GERD (gastroesophageal reflux disease) 02/15/2017  . Glaucoma   . Hiatal hernia   . History of abdominal hernia 05/21/2017  . History of compression fracture of spine 02/15/2017  . Hyperlipidemia   . Incisional hernia   . Incisional ventral hernia w obstruction 06/02/2017  . Normocytic anemia 05/23/2017  . 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 Presbycusis of both ears 02/15/2017   HOSPITAL COURSE:   Pamela Ball is a 73 year old female who presented to the ED with fever, cough, malaise, and body aches.  In the ED, she tested positive for influenza A.  Chest x-ray did not show any evidence of pneumonia.  She was noted to have oxygen desaturations on room air, requiring supplemental oxygen.  She was admitted for further management.  1.  Acute hypoxic respiratory failure in the setting of mild COPD exacerbation and influenza A-patient feeling much better this morning.  She has improved faster than expected. -Able to be reamed to room air -Ambulated with pulse ox on the day of discharge, and able to maintain oxygen saturations  greater than 90% -Seen by PT, who recommended home health PT.  2.  Influenza A: Treated with Tamiflu for a total of 5 days  3.  Mild COPD exacerbation: Given 1 dose of IV Solu-Medrol, and then patient refused any additional doses of steroids because they make her feel crazy.  No wheezing on the day of discharge.  4.  Essential hypertension: Continud metoprolol  5.  Bipolar disorder: Continued Effexor, Zyprexa, Wellbutrin and BuSpar  6.  Hyperlipidemia: Continued Zocor  DISCHARGE CONDITIONS:  Influenza A Mild C OPD exacerbation Hypertension Bipolar disorder Hyperlipidemia CONSULTS OBTAINED:   DRUG ALLERGIES:   Allergies  Allergen Reactions  . Codeine Other (See Comments)    Reaction: unknown Hasn't had a reaction in years   DISCHARGE MEDICATIONS:   Allergies as of 12/27/2018      Reactions   Codeine Other (See Comments)   Reaction: unknown Hasn't had a reaction in years      Medication List    TAKE these medications   buPROPion 150 MG 24 hr tablet Commonly known as:  WELLBUTRIN XL Take 150 mg by mouth daily.   busPIRone 10 MG tablet Commonly known as:  BUSPAR Take 1 tablet (10 mg total) by mouth daily. Prescribed by Psychiatry Dr Kasandra Knudsen   cholecalciferol 1000 units tablet Commonly known as:  VITAMIN D Take 1,000 Units by mouth daily.   diclofenac sodium 1 % Gel Commonly known as:  VOLTAREN Apply 4 g topically 4 (four) times daily.   metoprolol succinate 25 MG 24 hr tablet Commonly known as:  TOPROL-XL  Take 1 tablet (25 mg total) by mouth daily.   MULTI-VITAMINS Tabs Take 1 tablet by mouth daily.   OLANZapine 5 MG tablet Commonly known as:  ZYPREXA Take 5 mg by mouth at bedtime.   omeprazole 20 MG capsule Commonly known as:  PRILOSEC Take 1 capsule (20 mg total) by mouth daily before breakfast.   oseltamivir 30 MG capsule Commonly known as:  TAMIFLU Take 1 capsule (30 mg total) by mouth 2 (two) times daily.   simvastatin 20 MG tablet Commonly  known as:  ZOCOR TAKE 1 TABLET BY MOUTH EVERY DAY   venlafaxine XR 75 MG 24 hr capsule Commonly known as:  EFFEXOR-XR Take 75 mg by mouth daily with breakfast.   vitamin A 10000 UNIT capsule Take 10,000 Units by mouth daily.   vitamin C 500 MG tablet Commonly known as:  ASCORBIC ACID Take 500 mg by mouth daily.        DISCHARGE INSTRUCTIONS:  1.  Follow-up with PCP in 5 days 2.  Take Tamiflu twice a day for a total 5-day course DIET:  Cardiac diet DISCHARGE CONDITION:  Stable ACTIVITY:  Activity as tolerated OXYGEN:  Home Oxygen: No.  Oxygen Delivery: room air DISCHARGE LOCATION:  home   If you experience worsening of your admission symptoms, develop shortness of breath, life threatening emergency, suicidal or homicidal thoughts you must seek medical attention immediately by calling 911 or calling your MD immediately  if symptoms less severe.  You Must read complete instructions/literature along with all the possible adverse reactions/side effects for all the Medicines you take and that have been prescribed to you. Take any new Medicines after you have completely understood and accpet all the possible adverse reactions/side effects.   Please note  You were cared for by a hospitalist during your hospital stay. If you have any questions about your discharge medications or the care you received while you were in the hospital after you are discharged, you can call the unit and asked to speak with the hospitalist on call if the hospitalist that took care of you is not available. Once you are discharged, your primary care physician will handle any further medical issues. Please note that NO REFILLS for any discharge medications will be authorized once you are discharged, as it is imperative that you return to your primary care physician (or establish a relationship with a primary care physician if you do not have one) for your aftercare needs so that they can reassess your need for  medications and monitor your lab values.    On the day of Discharge:  VITAL SIGNS:  Blood pressure (!) 127/55, pulse 78, temperature 98 F (36.7 C), resp. rate 19, height 4\' 10"  (1.473 m), weight 53.6 kg, SpO2 91 %. PHYSICAL EXAMINATION:  GENERAL:  73 y.o.-year-old patient lying in the bed with no acute distress.  EYES: Pupils equal, round, reactive to light and accommodation. No scleral icterus. Extraocular muscles intact.  HEENT: Head atraumatic, normocephalic. Oropharynx and nasopharynx clear.  NECK:  Supple, no jugular venous distention. No thyroid enlargement, no tenderness.  LUNGS: + Mildly diminished breath sounds throughout all lung fields.  No wheezing.  No use of accessory muscles of respiration.  CARDIOVASCULAR: RRR, S1, S2 normal. No murmurs, rubs, or gallops.  ABDOMEN: Soft, non-tender, non-distended. Bowel sounds present. No organomegaly or mass.  EXTREMITIES: No pedal edema, cyanosis, or clubbing.  NEUROLOGIC: Cranial nerves II through XII are intact. Muscle strength 5/5 in all extremities. Sensation intact. Gait not  checked.  PSYCHIATRIC: The patient is alert and oriented x 3.  SKIN: No obvious rash, lesion, or ulcer.  DATA REVIEW:   CBC Recent Labs  Lab 12/27/18 0343  WBC 3.6*  HGB 14.4  HCT 44.2  PLT 175    Chemistries  Recent Labs  Lab 12/26/18 0621 12/27/18 0343  NA 137 133*  K 3.7 3.6  CL 100 97*  CO2 25 25  GLUCOSE 138* 75  BUN 10 11  CREATININE 0.57 0.61  CALCIUM 9.4 8.4*  AST 61*  --   ALT 41  --   ALKPHOS 123  --   BILITOT 0.6  --      Microbiology Results  Results for orders placed or performed during the hospital encounter of 12/26/18  Blood Culture (routine x 2)     Status: None (Preliminary result)   Collection Time: 12/26/18  6:21 AM  Result Value Ref Range Status   Specimen Description BLOOD R WRIST  Final   Special Requests   Final    BOTTLES DRAWN AEROBIC AND ANAEROBIC Blood Culture results may not be optimal due to an  inadequate volume of blood received in culture bottles   Culture   Final    NO GROWTH 1 DAY Performed at [facility], [address], [place], [place] [address]    Report Status PENDING  Incomplete  Blood Culture (routine x 2)     Status: None (Preliminary result)   Collection Time: 12/26/18  6:21 AM  Result Value Ref Range Status   Specimen Description BLOOD LEFT [facility]  Final   Special Requests   Final    BOTTLES DRAWN AEROBIC AND ANAEROBIC Blood Culture adequate volume   Culture   Final    NO GROWTH 1 DAY Performed at [facility], [address] [place], [place] [address]    Report Status PENDING  Incomplete  Urine culture     Status: None   Collection Time: 12/26/18  6:21 AM  Result Value Ref Range Status   Specimen Description   Final    URINE, RANDOM Performed at [facility], [address] [place], [place] [address]    Special Requests   Final    NONE Performed at [facility], [address] [place], [place] [address]    Culture   Final    NO GROWTH Performed at [facility], [place] [address], [place], [place] [address]    Report Status 12/27/2018 FINAL  Final    RADIOLOGY:  No results found.   Management plans discussed with the patient, family and they are in agreement.  CODE STATUS: Full Code   TOTAL TIME TAKING CARE OF THIS PATIENT: 35 minutes.    Berna Spare Orlanda Frankum M.D on 12/27/2018 at 2:38 PM  Between 7am to 6pm - Pager [phone]  After 6pm go to [email or web address] - Proofreader  [facility]  Office  [phone]  CC: Primary care physician; Olin Hauser, DO   Note: This dictation was prepared with Dragon dictation along with smaller phrase technology. Any transcriptional errors that result from this process are unintentional.

## 2018-12-27 NOTE — Progress Notes (Signed)
PHARMACIST - PHYSICIAN COMMUNICATION  CONCERNING:  Oseltamivir dose change  DESCRIPTION:  This patient has an order for Oseltamivir (Tamiflu) and has an Estimated Creatinine Clearance: 46.2 mL/min (by C-G formula based on SCr of 0.61 mg/dL).. The package insert for Oseltamivir recommends 30mg  BID x 5 days for patients with CrCl 30-60 ml/min.   RECOMMENDATION: Oseltamivir 30 mg BID x 4 more days (to complete 5 days) has been substituted for your patient.

## 2018-12-27 NOTE — Evaluation (Signed)
Physical Therapy Evaluation Patient Details Name: Pamela Ball MRN: [id number] DOB: 1945/11/16 Today's Date: 12/27/2018   History of Present Illness  Patient is a pleasant 73 year old female who was admitted for fever, coughs and body aches secondary to flu. Patient has PMH of COPD, bipolar 1 disorder, depression, and HLD. Patient's oxgyen declined to 70's when ambulating in ER. Does not use oxygen at home.   Clinical Impression  Patient is a pleasant 73 year old female who presents with flu and generalized weakness. Sp02 monitored with mobility and remained >90 with transfers and ambulation with RW on room air. Patient has diffuse LE weakness resulting in reliance upon UE's for stability and mobility at this time. Due to patient's weakness and limited capacity for functional mobility patient would benefit from skilled physical therapy to increase strength, stability, and decrease falls risk for improved quality of life. Upon discharge patient would benefit from home health physical therapy to address the above mentioned deficits.     Follow Up Recommendations Home health PT    Equipment Recommendations  (shower chair)    Recommendations for Other Services       Precautions / Restrictions Precautions Precautions: Fall Restrictions Weight Bearing Restrictions: No Other Position/Activity Restrictions: monitor sp02 levels       Mobility  Bed Mobility Overal bed mobility: Independent             General bed mobility comments: supine to sit EOB independently. Sp02 monitored, remained >90 on room air  Transfers Overall transfer level: Needs assistance Equipment used: Standard walker Transfers: Sit to/from Stand Sit to Stand: Supervision;Min guard         General transfer comment: CGA sit <>stand transfer with RW. Monitor Sp02 with level remaining >90  Ambulation/Gait Ambulation/Gait assistance: Min guard Gait Distance (Feet): 160 Feet Assistive device: Standard  walker Gait Pattern/deviations: Shuffle;Narrow base of support     General Gait Details: ambulates with RW and shuffle step due to limited LE strength and reliance upon UE's for stability. Sp02 monitored remaining from 91-94 degrees.   Stairs            Wheelchair Mobility    Modified Rankin (Stroke Patients Only)       Balance Overall balance assessment: Needs assistance;History of Falls Sitting-balance support: Feet unsupported;Single extremity supported Sitting balance-Leahy Scale: Good Sitting balance - Comments: reaches inside and outside BOS with SUE support without LOB Postural control: Posterior lean Standing balance support: Bilateral upper extremity supported;During functional activity Standing balance-Leahy Scale: Poor Standing balance comment: Requires BUE support due to LE buckling from LE fatigue in standing. SP02 monitored, remaining >90 in standing.                              Pertinent Vitals/Pain Pain Assessment: 0-10 Pain Score: 5  Pain Location: neck  Pain Descriptors / Indicators: Aching Pain Intervention(s): Monitored during session    Home Living Family/patient expects to be discharged to:: Private residence Living Arrangements: Spouse/significant other Available Help at Discharge: Family Type of Home: House Home Access: Stairs to enter Entrance Stairs-Rails: None Entrance Stairs-Number of Steps: 2 steps  Home Layout: One level Home Equipment: Environmental consultant - 2 wheels;Bedside commode Additional Comments: Patient utilizes RW at home at baseline.    Prior Function Level of Independence: Needs assistance   Gait / Transfers Assistance Needed: Able to walk with RW at home independently.   ADL's / Homemaking Assistance Needed: Needs assistance  to get into and out of tub from husband, husband does the grocery shopping  Comments: Husband helps with IADLS and getting in and out of tub.      Hand Dominance   Dominant Hand: Left     Extremity/Trunk Assessment   Upper Extremity Assessment Upper Extremity Assessment: Generalized weakness    Lower Extremity Assessment Lower Extremity Assessment: Generalized weakness(Gross 3+/5 strength bilaterally with fatigue from prolonged hold)    Cervical / Trunk Assessment Cervical / Trunk Assessment: Normal  Communication   Communication: No difficulties  Cognition Arousal/Alertness: Awake/alert Behavior During Therapy: WFL for tasks assessed/performed Overall Cognitive Status: Within Functional Limits for tasks assessed                                 General Comments: Patient alert and oriented to complex and simple commands.       General Comments General comments (skin integrity, edema, etc.): slight swelling of BLE, patient reports this is normal for her.     Exercises Other Exercises Other Exercises: supine: ankle pumps 20x Other Exercises: seated: sitting EOB monitoring SP02x 4 minutes , purse lipped breathing education, LAQ10x Other Exercises: STS with CGA and RW education on proper hand placement Other Exercises: ambulate with RW and CGA, monitoring SP02 on room air   Assessment/Plan    PT Assessment Patient needs continued PT services  PT Problem List Decreased strength;Decreased range of motion;Decreased activity tolerance;Decreased coordination;Decreased mobility;Decreased balance       PT Treatment Interventions Gait training;Stair training;Functional mobility training;Therapeutic activities;Therapeutic exercise;Balance training;Neuromuscular re-education;Manual techniques;Patient/family education    PT Goals (Current goals can be found in the Care Plan section)  Acute Rehab PT Goals Patient Stated Goal: to return home PT Goal Formulation: With patient Time For Goal Achievement: 01/10/19 Potential to Achieve Goals: Good    Frequency Min 2X/week   Barriers to discharge   will benefit from [facility] PT due to LE weakness      Co-evaluation               AM-PAC PT "6 Clicks" Mobility  Outcome Measure Help needed turning from your back to your side while in a flat bed without using bedrails?: None Help needed moving from lying on your back to sitting on the side of a flat bed without using bedrails?: A Little Help needed moving to and from a bed to a chair (including a wheelchair)?: A Little Help needed standing up from a chair using your arms (e.g., wheelchair or bedside chair)?: A Little Help needed to walk in hospital room?: A Little Help needed climbing 3-5 steps with a railing? : A Little 6 Click Score: 19    End of Session Equipment Utilized During Treatment: Gait belt(monitor Sp02 levels with mobility ) Activity Tolerance: Patient tolerated treatment well;Patient limited by fatigue Patient left: in bed;with call bell/phone within reach;with family/visitor present Nurse Communication: Mobility status PT Visit Diagnosis: Unsteadiness on feet (R26.81);Other abnormalities of gait and mobility (R26.89);Muscle weakness (generalized) (M62.81);History of falling (Z91.81)    Time: 7829-5621 PT Time Calculation (min) (ACUTE ONLY): 24 min   Charges:   PT Evaluation $PT Eval Low Complexity: 1 Low PT Treatments $Gait Training: 8-22 mins        Janna Arch, PT, DPT    Janna Arch 12/27/2018, 12:00 PM

## 2018-12-30 ENCOUNTER — Telehealth: Payer: Self-pay

## 2018-12-30 DIAGNOSIS — Z13820 Encounter for screening for osteoporosis: Secondary | ICD-10-CM | POA: Diagnosis not present

## 2018-12-30 LAB — HM DEXA SCAN

## 2018-12-30 NOTE — Telephone Encounter (Signed)
Transition Care Management Follow-up Telephone Call  Date of discharge and from where: 12/27/2018  How have you been since you were released from the hospital? "I still have a cough and headache today" still has one more dose of tamiflu  Any questions or concerns? No   Items Reviewed:  Did the pt receive and understand the discharge instructions provided? Yes   Medications obtained and verified? Yes  patient states she is also taking robitussin for the cough  Any new allergies since your discharge? No   Dietary orders reviewed? Yes  Do you have support at home? Yes   Functional Questionnaire: (I = Independent and D = Dependent) ADLs:   Bathing/Dressing- I  Meal Prep- I  Eating- I  Maintaining continence- I  Transferring/Ambulation- I  Managing Meds- I  Follow up appointments reviewed:   PCP Hospital f/u appt confirmed? Yes  Scheduled to see Dr.Karamalegos on 01/02/2019 @ 8:00am.  [facility] f/u appt confirmed? n/a  Are transportation arrangements needed? No   If their condition worsens, is the pt aware to call PCP or go to the Emergency Dept.? Yes  Was the patient provided with contact information for the PCP's office or ED? Yes  Was to pt encouraged to call back with questions or concerns? Yes

## 2018-12-31 LAB — CULTURE, BLOOD (ROUTINE X 2)
Culture: NO GROWTH
Culture: NO GROWTH
Special Requests: ADEQUATE

## 2019-01-02 ENCOUNTER — Other Ambulatory Visit: Payer: Self-pay

## 2019-01-02 ENCOUNTER — Encounter: Payer: Self-pay | Admitting: Family Medicine

## 2019-01-02 ENCOUNTER — Encounter: Payer: Self-pay | Admitting: *Deleted

## 2019-01-02 ENCOUNTER — Ambulatory Visit (INDEPENDENT_AMBULATORY_CARE_PROVIDER_SITE_OTHER): Payer: Medicare Other | Admitting: Family Medicine

## 2019-01-02 VITALS — BP 141/67 | HR 94 | Temp 98.4°F | Resp 16 | Ht <= 58 in | Wt 115.8 lb

## 2019-01-02 DIAGNOSIS — R110 Nausea: Secondary | ICD-10-CM | POA: Diagnosis not present

## 2019-01-02 DIAGNOSIS — J432 Centrilobular emphysema: Secondary | ICD-10-CM | POA: Diagnosis not present

## 2019-01-02 DIAGNOSIS — J441 Chronic obstructive pulmonary disease with (acute) exacerbation: Secondary | ICD-10-CM | POA: Diagnosis not present

## 2019-01-02 DIAGNOSIS — J101 Influenza due to other identified influenza virus with other respiratory manifestations: Secondary | ICD-10-CM | POA: Diagnosis not present

## 2019-01-02 MED ORDER — ALBUTEROL SULFATE HFA 108 (90 BASE) MCG/ACT IN AERS: 2.0000 | INHALATION_SPRAY | RESPIRATORY_TRACT | 0 refills | Status: DC | PRN

## 2019-01-02 MED ORDER — ONDANSETRON 4 MG PO TBDP: 4.0000 mg | ORAL_TABLET | Freq: Three times a day (TID) | ORAL | 0 refills | Status: DC | PRN

## 2019-01-02 NOTE — Progress Notes (Signed)
Subjective:    Patient ID: Pamela Ball, female    DOB: Apr 21, 1946, 73 y.o.   MRN: [id number]  Pamela Ball is a 73 y.o. female presenting on 01/02/2019 for Hospitalization Follow-up (diagnosed with flu A nausea and headache but not throwing up, weak and exhaustion,  cough is getting improved )  Accompanied by spouse, Karelly Dewalt, who provides additional history.  HPI  HOSPITAL FOLLOW-UP VISIT  Hospital/Location: [facility] Date of Admission: 12/26/18 Date of Discharge: 12/27/18 Transitions of care telephone call: 12/30/18 - completed by Tyler Aas LPN  Reason for Admission: Fever, Cough, Influenza, Hypoxia Primary (+Secondary) Diagnosis: Acute hypoxic respiratory failure in setting mild AECOPD, Influenza A  - Hospital H&P and Discharge Summary have been reviewed - Patient presents today 6 days after recent hospitalization. Brief summary of recent course, patient had symptoms of fever, dyspnea, malaise, bodyaches, hospitalized with dx influenza A tested positive, had desaturation and hypoxia, treated with tamiflu, IV steroid for COPD exac, chest x-ray, and breathing treatment, no further steroids used due to history of problem with prednisone. Did not need O2 on discharge.  - Today reports overall has done fairly well after discharge. Symptoms of fever, aches, respiratory status dyspnea improved. Now still has some residual nausea and malaise.  - New medications on discharge: Tamiflu - Changes to current meds on discharge: none  Denies any chest pain, dyspnea, fever, chills, bodyaches, vomiting, diarrhea, abdominal pain  I have reviewed the discharge medication list, and have reconciled the current and discharge medications today.   Current Outpatient Medications:  .  buPROPion (WELLBUTRIN XL) 150 MG 24 hr tablet, Take 150 mg by mouth daily. , Disp: , Rfl:  .  busPIRone (BUSPAR) 10 MG tablet, Take 1 tablet (10 mg total) by mouth daily. Prescribed by Psychiatry Dr Kasandra Knudsen,  Disp: , Rfl:  .  cholecalciferol (VITAMIN D) 1000 units tablet, Take 1,000 Units by mouth daily., Disp: , Rfl:  .  diclofenac sodium (VOLTAREN) 1 % GEL, Apply 4 g topically 4 (four) times daily. (Patient not taking: Reported on 01/02/2019), Disp: 1 Tube, Rfl: 1 .  Multiple Vitamin (MULTI-VITAMINS) TABS, Take 1 tablet by mouth daily. , Disp: , Rfl:  .  OLANZapine (ZYPREXA) 5 MG tablet, Take 5 mg by mouth at bedtime. , Disp: , Rfl:  .  omeprazole (PRILOSEC) 20 MG capsule, Take 1 capsule (20 mg total) by mouth daily before breakfast., Disp: 30 capsule, Rfl: 5 .  simvastatin (ZOCOR) 20 MG tablet, TAKE 1 TABLET BY MOUTH EVERY DAY, Disp: 90 tablet, Rfl: 1 .  venlafaxine XR (EFFEXOR-XR) 75 MG 24 hr capsule, Take 75 mg by mouth daily with breakfast. , Disp: , Rfl:  .  vitamin A 10000 UNIT capsule, Take 10,000 Units by mouth daily., Disp: , Rfl:  .  vitamin C (ASCORBIC ACID) 500 MG tablet, Take 500 mg by mouth daily., Disp: , Rfl:  .  albuterol (PROVENTIL HFA;VENTOLIN HFA) 108 (90 Base) MCG/ACT inhaler, Inhale 2 puffs into the lungs every 4 (four) hours as needed for wheezing or shortness of breath (cough)., Disp: 1 Inhaler, Rfl: 0 .  Cholecalciferol (VITAMIN D3 PO), Take 1,000 Units by mouth. Patient states she takes  3,000 units 1 time per day, Disp: , Rfl:  .  metoprolol succinate (TOPROL-XL) 25 MG 24 hr tablet, Take 1 tablet (25 mg total) by mouth daily., Disp: 30 tablet, Rfl: 1 .  metoprolol tartrate (LOPRESSOR) 25 MG tablet, Take 25 mg by mouth 2 (two) times daily. Patient  states she takes 1 tablet 1 time per day, Disp: , Rfl:  .  ondansetron (ZOFRAN ODT) 4 MG disintegrating tablet, Take 1 tablet (4 mg total) by mouth every 8 (eight) hours as needed for nausea or vomiting., Disp: 30 tablet, Rfl: 0  ------------------------------------------------------------------------- Social History   Tobacco Use  . Smoking status: Never Smoker  . Smokeless tobacco: Never Used  Substance Use Topics  . Alcohol  use: No  . Drug use: No    Review of Systems Per HPI unless specifically indicated above     Objective:    BP (!) 141/67   Pulse 94   Temp 98.4 F (36.9 C) (Oral)   Resp 16   Ht 4\' 10"  (1.473 m)   Wt 115 lb 12.8 oz (52.5 kg)   SpO2 94%   BMI 24.20 kg/m   Wt Readings from Last 3 Encounters:  01/02/19 115 lb 12.8 oz (52.5 kg)  12/27/18 118 lb 3.2 oz (53.6 kg)  11/19/18 121 lb (54.9 kg)    Physical Exam Vitals signs and nursing note reviewed.  Constitutional:      General: She is not in acute distress.    Appearance: She is well-developed. She is not diaphoretic.     Comments: Mildly ill appearing uncomfortable with some nausea, cooperative, prefers to lay down.  HENT:     Head: Normocephalic and atraumatic.  Eyes:     General:        Right eye: No discharge.        Left eye: No discharge.     Conjunctiva/sclera: Conjunctivae normal.  Neck:     Musculoskeletal: Normal range of motion and neck supple.     Thyroid: No thyromegaly.  Cardiovascular:     Rate and Rhythm: Normal rate and regular rhythm.     Heart sounds: Normal heart sounds. No murmur.  Pulmonary:     Effort: Pulmonary effort is normal. No respiratory distress.     Breath sounds: Normal breath sounds. No wheezing or rales.     Comments: Good air movement. No coughing. Abdominal:     General: Bowel sounds are normal. There is no distension.     Palpations: Abdomen is soft.     Tenderness: There is no abdominal tenderness.  Musculoskeletal: Normal range of motion.  Lymphadenopathy:     Cervical: No cervical adenopathy.  Skin:    General: Skin is warm and dry.     Findings: No erythema or rash.  Neurological:     Mental Status: She is alert and oriented to person, place, and time.  Psychiatric:        Behavior: Behavior normal.     Comments: Well groomed, good eye contact, normal speech and thoughts    Results for orders placed or performed during the hospital encounter of 12/26/18  Blood Culture  (routine x 2)  Result Value Ref Range   Specimen Description BLOOD R WRIST    Special Requests      BOTTLES DRAWN AEROBIC AND ANAEROBIC Blood Culture results may not be optimal due to an inadequate volume of blood received in culture bottles   Culture      NO GROWTH 5 DAYS Performed at [facility], [address] [place], [place] [address]    Report Status 12/31/2018 FINAL   Blood Culture (routine x 2)  Result Value Ref Range   Specimen Description BLOOD LEFT AC    Special Requests      BOTTLES DRAWN AEROBIC AND ANAEROBIC  Blood Culture adequate volume   Culture      NO GROWTH 5 DAYS Performed at [facility], [address] [place], [place] [address]    Report Status 12/31/2018 FINAL   Urine culture  Result Value Ref Range   Specimen Description      URINE, RANDOM Performed at [facility], [address], [place], [place] [address]    Special Requests      NONE Performed at [facility], [address] [place], [place] [address]    Culture      NO GROWTH Performed at [facility], [place] [address], [place], [place] [address]    Report Status 12/27/2018 FINAL   Influenza panel by PCR (type A & B)  Result Value Ref Range   Influenza A By PCR POSITIVE (A) NEGATIVE   Influenza B By PCR NEGATIVE NEGATIVE  Lactic acid, plasma  Result Value Ref Range   Lactic Acid, Venous 1.8 0.5 - 1.9 mmol/L  Comprehensive metabolic panel  Result Value Ref Range   Sodium 137 135 - 145 mmol/L   Potassium 3.7 3.5 - 5.1 mmol/L   Chloride 100 98 - 111 mmol/L   CO2 25 22 - 32 mmol/L   Glucose, Bld 138 (H) 70 - 99 mg/dL   BUN 10 8 - 23 mg/dL   Creatinine, Ser 0.57 0.44 - 1.00 mg/dL   Calcium 9.4 8.9 - 10.3 mg/dL   Total Protein 7.6 6.5 - 8.1 g/dL   Albumin 4.5 3.5 - 5.0 g/dL   AST 61 (H) 15 - 41 U/L   ALT 41 0 - 44 U/L   Alkaline Phosphatase 123 38 - 126 U/L   Total Bilirubin 0.6 0.3 - 1.2 mg/dL   GFR calc non Af Amer >60 >60 mL/min   GFR  calc Af Amer >60 >60 mL/min   Anion gap 12 5 - 15  Lipase, blood  Result Value Ref Range   Lipase 24 11 - 51 U/L  Brain natriuretic peptide - IF patient is dyspneic  Result Value Ref Range   B Natriuretic Peptide 25.0 0.0 - 100.0 pg/mL  CBC WITH DIFFERENTIAL  Result Value Ref Range   WBC 4.2 4.0 - 10.5 K/uL   RBC 4.94 3.87 - 5.11 MIL/uL   Hemoglobin 15.3 (H) 12.0 - 15.0 g/dL   HCT 47.9 (H) 36.0 - 46.0 %   MCV 97.0 80.0 - 100.0 fL   MCH 31.0 26.0 - 34.0 pg   MCHC 31.9 30.0 - 36.0 g/dL   RDW 13.8 11.5 - 15.5 %   Platelets 211 150 - 400 K/uL   nRBC 0.0 0.0 - 0.2 %   Neutrophils Relative % 81 %   Neutro Abs 3.4 1.7 - 7.7 K/uL   Lymphocytes Relative 6 %   Lymphs Abs 0.2 (L) 0.7 - 4.0 K/uL   Monocytes Relative 11 %   Monocytes Absolute 0.4 0.1 - 1.0 K/uL   Eosinophils Relative 1 %   Eosinophils Absolute 0.0 0.0 - 0.5 K/uL   Basophils Relative 1 %   Basophils Absolute 0.0 0.0 - 0.1 K/uL   Immature Granulocytes 0 %   Abs Immature Granulocytes 0.01 0.00 - 0.07 K/uL  Procalcitonin  Result Value Ref Range   Procalcitonin 0.10 ng/mL  Urinalysis, Complete w Microscopic  Result Value Ref Range   Color, Urine YELLOW (A) YELLOW   APPearance CLEAR (A) CLEAR   Specific Gravity, Urine 1.014 1.005 - 1.030   pH 7.0  5.0 - 8.0   Glucose, UA NEGATIVE NEGATIVE mg/dL   Hgb urine dipstick NEGATIVE NEGATIVE   Bilirubin Urine NEGATIVE NEGATIVE   Ketones, ur 5 (A) NEGATIVE mg/dL   Protein, ur NEGATIVE NEGATIVE mg/dL   Nitrite NEGATIVE NEGATIVE   Leukocytes,Ua NEGATIVE NEGATIVE   RBC / HPF 0-5 0 - 5 RBC/hpf   WBC, UA 0-5 0 - 5 WBC/hpf   Bacteria, UA NONE SEEN NONE SEEN   Squamous Epithelial / LPF NONE SEEN 0 - 5   Mucus PRESENT   Protime-INR  Result Value Ref Range   Prothrombin Time 12.6 11.4 - 15.2 seconds   INR 0.95   CBC  Result Value Ref Range   WBC 3.6 (L) 4.0 - 10.5 K/uL   RBC 4.50 3.87 - 5.11 MIL/uL   Hemoglobin 14.4 12.0 - 15.0 g/dL   HCT 44.2 36.0 - 46.0 %   MCV 98.2 80.0 -  100.0 fL   MCH 32.0 26.0 - 34.0 pg   MCHC 32.6 30.0 - 36.0 g/dL   RDW 13.5 11.5 - 15.5 %   Platelets 175 150 - 400 K/uL   nRBC 0.0 0.0 - 0.2 %  Basic metabolic panel  Result Value Ref Range   Sodium 133 (L) 135 - 145 mmol/L   Potassium 3.6 3.5 - 5.1 mmol/L   Chloride 97 (L) 98 - 111 mmol/L   CO2 25 22 - 32 mmol/L   Glucose, Bld 75 70 - 99 mg/dL   BUN 11 8 - 23 mg/dL   Creatinine, Ser 0.61 0.44 - 1.00 mg/dL   Calcium 8.4 (L) 8.9 - 10.3 mg/dL   GFR calc non Af Amer >60 >60 mL/min   GFR calc Af Amer >60 >60 mL/min   Anion gap 11 5 - 15      Assessment & Plan:   Problem List Items Addressed This Visit    Centrilobular emphysema (HCC)   Relevant Medications   albuterol (PROVENTIL HFA;VENTOLIN HFA) 108 (90 Base) MCG/ACT inhaler   COPD exacerbation (HCC)    Resolved. No further wheezing. POx normal on RA S/p IV steroid in hospital No further prednisone required - prefer to avoid with her history of intolerance  Continue albuterol PRN      Relevant Medications   albuterol (PROVENTIL HFA;VENTOLIN HFA) 108 (90 Base) MCG/ACT inhaler    Other Visit Diagnoses    Influenza A    -  Primary   Nausea       Relevant Medications   ondansetron (ZOFRAN ODT) 4 MG disintegrating tablet    RESOLVING Influenza A Now residual lingering symptoms nausea, malaise Afebrile, otherwise non focal exam. No lung abnormality  Plan Finish tamiflu Add Zofran PRN -  Improve hydration appetite Return criteria if worsening breathing, consider repeat CXR rule out PNA if complication of Flu  ------  #Bipolar / Mood Disorder / Anxiety - Patient is complaining of nerves worse from hospital, husband is requesting Ativan for her, and I advised him that they should contact her Psychiatry office Dr Kasandra Knudsen to discuss this further, that I would not routinely prescribe this medicine for her.    Meds ordered this encounter  Medications  . ondansetron (ZOFRAN ODT) 4 MG disintegrating tablet    Sig: Take 1 tablet  (4 mg total) by mouth every 8 (eight) hours as needed for nausea or vomiting.    Dispense:  30 tablet    Refill:  0  . albuterol (PROVENTIL HFA;VENTOLIN HFA) 108 (90 Base) MCG/ACT inhaler  Sig: Inhale 2 puffs into the lungs every 4 (four) hours as needed for wheezing or shortness of breath (cough).    Dispense:  1 Inhaler    Refill:  0    Follow up plan: Return in about 1 week (around 01/09/2019), or if symptoms worsen or fail to improve, for Flu.  [name], [facility] 01/02/2019, 8:10 AM

## 2019-01-02 NOTE — Telephone Encounter (Signed)
This encounter was created in error - please disregard.

## 2019-01-02 NOTE — Assessment & Plan Note (Signed)
Resolved. No further wheezing. POx normal on RA S/p IV steroid in hospital No further prednisone required - prefer to avoid with her history of intolerance  Continue albuterol PRN

## 2019-01-02 NOTE — Patient Outreach (Addendum)
[facility]) Care Management  01/02/2019  Kimberleigh Mehan September 09, 1946 [id number]  EMMI: General discharge red alert Referral date: 01/02/19 Referral reason: lost interest in things: yes Insurance: [place] health care Day # 4  Telephone call to patient regarding EMMI general discharge red alert. HIPAA verified. Explained reason for call.   Patient states she is having some depression. Patient states her son and his wife have separated and they have a baby.  Patient states her son is on crack and this is very upsetting to her.  Patient states she is bipolar and is taking medications regularly.  Patient states she saw her primary MD today.  She states she asked her primary MD to prescribe her a nerve pill.  Patient states her primary MD states she would have to request this medication from her psychiatrist.  Patient states she was in the hospital due to having the flu.  She states she can't seem to get well. Patient states she feels like her depression is worse because she doesn't feel well.   Patient states she has lost 15 lbs over the last 2 months. She state she lost 5 of the 15 lbs when she was in the hospital for the flu.  Patient states her appetite has not been good.  Patient states when she saw her doctor today he prescribed her medication for her nausea and inhaler.   Patient states she is out of her effexor and needs a refill. Patient requested RNCM to contact her pharmacy to request refill. Patient states she cannot see well and cannot write. Patient states her husband assist her with her care.  RNCM discussed and offered [facility] care management services. Patient verbally agreed to follow up with [facility] care management social worker.  RNCM called patients pharmacy and spoke with Abigail Butts to request refill for patients effexor.  Abigail Butts states patients effexor will be ready today for pick up. RNCM contacted patient to inform her that her medication Effexor will be ready for pick up  today. Patient expressed her appreciation.  RNCM advised patient to notify MD of any changes in condition prior to scheduled appointment. RNCM provided contact name and number: [phone] or main office number [phone] and 24 hour nurse advise line [phone] by mail.  RNCM verified patient aware of 911 services for urgent/ emergent needs.  ASSESSMENT: PHQ -6  PHQ -18  PLAN: RNCM will refer patient to social worker RNCm will mail patient [facility] care management brochure/ magnet.  Quinn Plowman RN,BSN,CCM [facility] Telephonic  [phone]

## 2019-01-02 NOTE — Patient Instructions (Addendum)
Thank you for coming to the office today.  Flu is improving. No longer wheezing at this time. Breathing is improved  Start Zofran as needed for nausea - try to stay hydrated  Use Albuterol inhaler as needed sooner 2 puffs every 4-6 hours.  Recommend to contact Dr Kasandra Knudsen for discussing nerve medicine  Please schedule a Follow-up Appointment to: Return in about 1 week (around 01/09/2019), or if symptoms worsen or fail to improve, for Flu.  If you have any other questions or concerns, please feel free to call the office or send a message through [place]. You may also schedule an earlier appointment if necessary.  Additionally, you may be receiving a survey about your experience at our office within a few days to 1 week by e-mail or mail. We value your feedback.  [name], DO [place]

## 2019-01-08 ENCOUNTER — Other Ambulatory Visit: Payer: Self-pay | Admitting: *Deleted

## 2019-01-08 NOTE — Patient Outreach (Signed)
[name]  01/08/2019  Pamela Ball Nov 30, 1945 [id number]  CSW was able to make initial contact with patient and confirmed  patient's identity.  CSW introduced self, explained role and treason for call/referral. Pt reports her "nerves" and mental health needs are "a lot better" and that her MD has placed her on medication and she has a Teacher, music.  Pt denies any concerns or needs and declines further [facility] CSW outreach support. CSW will close referral and advise PCP and [facility] team.   Eduard Clos, MSW, [place] Worker  [place] [phone]

## 2019-01-29 ENCOUNTER — Other Ambulatory Visit: Payer: Self-pay | Admitting: Family Medicine

## 2019-01-29 DIAGNOSIS — K219 Gastro-esophageal reflux disease without esophagitis: Secondary | ICD-10-CM

## 2019-02-11 ENCOUNTER — Encounter: Payer: Self-pay | Admitting: Family Medicine

## 2019-02-11 DIAGNOSIS — M85871 Other specified disorders of bone density and structure, right ankle and foot: Secondary | ICD-10-CM | POA: Insufficient documentation

## 2019-02-20 ENCOUNTER — Telehealth: Payer: Self-pay

## 2019-02-20 NOTE — Telephone Encounter (Signed)
Called patient.  She has a new patient appointment for May 5th.  I was going to turn it to a video visit and move her appointment up. Patient stated that she did not have a smart phone nor a PC to do a video visit.  Patient stated she has continued to loose weight and has had the flu.

## 2019-02-24 ENCOUNTER — Other Ambulatory Visit: Payer: Self-pay

## 2019-02-25 NOTE — Telephone Encounter (Signed)
It appears the initial consult was placed in end of 2019 for non-STEMI in the setting of a fall with elevated troponin at that time  She seen primary care several times since then Now with flu, weight loss, COPD exacerbation In her current state not a good candidate for ischemic work-up Would probably have her call our office when weight stabilizes, breathing improves, flu symptoms resolve At that time we would most likely order a stress test, chemical Myoview thx TG

## 2019-02-26 NOTE — Telephone Encounter (Signed)
Call to patient to discuss upcoming appt on 5/5. She will need to be switched to virtual visit and obtain consent.   She has hx of consult from our office, so she will not be "new" patient.   No answer. LMTCB.

## 2019-02-28 NOTE — Telephone Encounter (Signed)
Recall entered for August.  Appt cancelled.

## 2019-02-28 NOTE — Telephone Encounter (Signed)
Spoke with patient regarding her upcoming appointment and to see if she has anyone who could assist her with a video visit. She states that due to her current sickness she is not able to really get out and is just so weak. Advised that we are not able to see new patients unless they are video options and she states that she really prefers to wait until she can be seen in the office. Advised that we can put her in for a recall appointment in August or September due to current COVID situation. She was agreeable with this and I also advised that if she should get access to a smartphone then we can always set her up with appointment sooner if she likes. She verbalized understanding, agreeable with plan, and had no further questions at this time.

## 2019-03-11 ENCOUNTER — Ambulatory Visit: Payer: Medicare Other | Admitting: Cardiovascular Disease

## 2019-03-11 ENCOUNTER — Encounter

## 2019-03-12 ENCOUNTER — Other Ambulatory Visit: Payer: Self-pay | Admitting: Family Medicine

## 2019-03-12 DIAGNOSIS — R110 Nausea: Secondary | ICD-10-CM

## 2019-04-01 ENCOUNTER — Telehealth: Payer: Self-pay | Admitting: Family Medicine

## 2019-04-01 NOTE — Telephone Encounter (Signed)
@  [facility] Chronic Care Management   Outreach Note  04/01/2019 Name: Pamela Ball MRN: [id number] DOB: 1946-06-02  Referred by: Olin Hauser, DO Reason for referral : Chronic Care Management (Initial CCM outreach call was unsuccessful )   An unsuccessful telephone outreach was attempted today. The patient was referred to the case management team by for assistance with chronic care management and care coordination.   Follow Up Plan: A HIPPA compliant phone message was left for the patient providing contact information and requesting a return call.  The CM team will reach out to the patient again over the next 7 days.  If patient returns call to provider office, please advise to call Am[place]* at [place]  [email or web address]   ??[phone]

## 2019-04-02 ENCOUNTER — Telehealth: Payer: Self-pay

## 2019-04-03 NOTE — Telephone Encounter (Signed)
Open in error

## 2019-04-16 ENCOUNTER — Telehealth: Payer: Self-pay | Admitting: Family Medicine

## 2019-04-16 NOTE — Chronic Care Management (AMB) (Signed)
Chronic Care Management   Note  04/16/2019 Name: Louvinia Cumbo MRN: [id number] DOB: [birth year]  Subrena Devereux Smestad is a 73 y.o. year old female who is a primary care patient of Olin Hauser, DO. I reached out to Bobby Rumpf by phone today in response to a referral sent by Ms. Annye Rusk Jolin's health plan.    Ms. Rayon was given information about Chronic Care Management services today including:  1. CCM service includes personalized support from designated clinical staff supervised by her physician, including individualized plan of care and coordination with other care providers 2. 24/7 contact phone numbers for assistance for urgent and routine care needs. 3. Service will only be billed when office clinical staff spend 20 minutes or more in a month to coordinate care. 4. Only one practitioner may furnish and bill the service in a calendar month. 5. The patient may stop CCM services at any time (effective at the end of the month) by phone call to the office staff. 6. The patient will be responsible for cost sharing (co-pay) of up to 20% of the service fee (after annual deductible is met).  Patient did not agree to enrollment in care management services and does not wish to consider at this time.  Follow up plan: The patient has been provided with contact information for the chronic care management team and has been advised to call with any health related questions or concerns.   [place]  [email or web address]   ??[phone]

## 2019-05-01 ENCOUNTER — Other Ambulatory Visit: Payer: Self-pay | Admitting: Family Medicine

## 2019-05-01 DIAGNOSIS — K219 Gastro-esophageal reflux disease without esophagitis: Secondary | ICD-10-CM

## 2019-05-23 DIAGNOSIS — L821 Other seborrheic keratosis: Secondary | ICD-10-CM | POA: Diagnosis not present

## 2019-05-23 DIAGNOSIS — L578 Other skin changes due to chronic exposure to nonionizing radiation: Secondary | ICD-10-CM | POA: Diagnosis not present

## 2019-05-23 DIAGNOSIS — L820 Inflamed seborrheic keratosis: Secondary | ICD-10-CM | POA: Diagnosis not present

## 2019-05-23 DIAGNOSIS — L570 Actinic keratosis: Secondary | ICD-10-CM | POA: Diagnosis not present

## 2019-06-03 ENCOUNTER — Other Ambulatory Visit: Payer: Self-pay | Admitting: Family Medicine

## 2019-06-03 ENCOUNTER — Ambulatory Visit: Payer: Medicare Other

## 2019-06-03 DIAGNOSIS — J432 Centrilobular emphysema: Secondary | ICD-10-CM

## 2019-06-03 DIAGNOSIS — Z0000 Encounter for general adult medical examination without abnormal findings: Secondary | ICD-10-CM

## 2019-06-03 DIAGNOSIS — J441 Chronic obstructive pulmonary disease with (acute) exacerbation: Secondary | ICD-10-CM

## 2019-06-03 NOTE — Progress Notes (Deleted)
  canelled and rescheudled appt.

## 2019-06-10 ENCOUNTER — Other Ambulatory Visit: Payer: Self-pay | Admitting: Family Medicine

## 2019-06-10 DIAGNOSIS — E782 Mixed hyperlipidemia: Secondary | ICD-10-CM

## 2019-06-10 MED ORDER — SIMVASTATIN 20 MG PO TABS: 20.0000 mg | ORAL_TABLET | Freq: Every day | ORAL | 1 refills | Status: DC

## 2019-06-10 NOTE — Addendum Note (Signed)
Addended by: Olin Hauser on: 06/10/2019 11:26 AM   Modules accepted: Orders

## 2019-06-13 ENCOUNTER — Telehealth: Payer: Self-pay | Admitting: Family Medicine

## 2019-06-13 ENCOUNTER — Encounter: Payer: Self-pay | Admitting: Family Medicine

## 2019-06-13 NOTE — Telephone Encounter (Signed)
Received fax from [facility] for med clearance, needs list medications and medical conditions.  [address] [place], [place] [address] [phone]  FAX [phone]  [name], [facility] 06/13/2019, 5:49 PM

## 2019-06-17 NOTE — Progress Notes (Deleted)
appt cancelled and rescheduled

## 2019-06-23 ENCOUNTER — Other Ambulatory Visit: Payer: Self-pay | Admitting: Family Medicine

## 2019-06-23 DIAGNOSIS — R110 Nausea: Secondary | ICD-10-CM

## 2019-07-01 ENCOUNTER — Telehealth: Payer: Self-pay | Admitting: Family Medicine

## 2019-07-01 ENCOUNTER — Ambulatory Visit (INDEPENDENT_AMBULATORY_CARE_PROVIDER_SITE_OTHER): Payer: Medicare Other

## 2019-07-01 DIAGNOSIS — Z0000 Encounter for general adult medical examination without abnormal findings: Secondary | ICD-10-CM | POA: Diagnosis not present

## 2019-07-01 DIAGNOSIS — Z1239 Encounter for other screening for malignant neoplasm of breast: Secondary | ICD-10-CM

## 2019-07-01 NOTE — Telephone Encounter (Addendum)
Patient asking for refill on Zyprexa.  Patient's Psychiatry is Dr Kasandra Knudsen [facility])  [facility] [address], [place], [id number] Phone: [phone]  She will need to request them, as I am not normally rx this medication for her.  [name], [facility] 07/01/2019, 5:40 PM

## 2019-07-01 NOTE — Progress Notes (Signed)
Subjective:   Pamela Ball is a 73 y.o. female who presents for Medicare Annual (Subsequent) preventive examination.  This visit is being conducted via phone call  - after an attmept to do on video chat - due to the COVID-19 pandemic. This patient has given me verbal consent via phone to conduct this visit, patient states they are participating from their home address. Some vital signs may be absent or patient reported.   Patient identification: identified by name, DOB, and current address.    Review of Systems:   Cardiac Risk Factors include: advanced age (>42men, >28 women);hypertension;dyslipidemia     Objective:     Vitals: There were no vitals taken for this visit.  There is no height or weight on file to calculate BMI.  Advanced Directives 07/01/2019 01/02/2019 12/26/2018 11/12/2018 09/26/2018 09/26/2018 09/23/2018  Does Patient Have a Medical Advance Directive? No No No No No No No  Would patient like information on creating a medical advance directive? - No - Patient declined No - Patient declined - No - Patient declined No - Patient declined -    Tobacco Social History   Tobacco Use  Smoking Status Never Smoker  Smokeless Tobacco Never Used     Counseling given: Not Answered   Clinical Intake:  Pre-visit preparation completed: Yes  Pain : No/denies pain     Nutritional Risks: None Diabetes: No  How often do you need to have someone help you when you read instructions, pamphlets, or other written materials from your doctor or pharmacy?: 1 - Never  Interpreter Needed?: No  Information entered by :: Hulda Reddix,LPN  Past Medical History:  Diagnosis Date   Abdominal hernia with obstruction and without gangrene    Anxiety 02/15/2017   Bipolar 1 disorder, depressed, full remission ([place]) 02/15/2017   Bipolar affective disorder ([place])    Depression    Dyspnea    GERD (gastroesophageal reflux disease) 02/15/2017   Glaucoma    Hiatal hernia     History of abdominal hernia 05/21/2017   History of compression fracture of spine 02/15/2017   Hyperlipidemia    Incisional hernia    Incisional ventral hernia w obstruction 06/02/2017   Normocytic anemia 05/23/2017   Osteoarthritis of knees, 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   Presbycusis of both ears 02/15/2017   Past Surgical History:  Procedure Laterality Date   ABDOMINAL HYSTERECTOMY  11/06/2006   CESAREAN SECTION     x3   COLONOSCOPY WITH PROPOFOL N/A 06/25/2018   Procedure: COLONOSCOPY WITH PROPOFOL;  Surgeon: Jonathon Bellows, MD;  Location: [facility] ENDOSCOPY;  Service: Endoscopy;  Laterality: N/A;   KYPHOPLASTY     KYPHOPLASTY N/A 09/23/2018   Procedure: KYPHOPLASTY T10;  Surgeon: Hessie Knows, MD;  Location: [facility] ORS;  Service: Orthopedics;  Laterality: N/A;   VENTRAL HERNIA REPAIR N/A 06/02/2017   Procedure: exploratory laparotomy repair of incarcerated  VENTRAL hernia;  Surgeon: Florene Glen, MD;  Location: [facility] ORS;  Service: General;  Laterality: N/A;   VENTRAL HERNIA REPAIR N/A 08/29/2018   Procedure: LAPAROSCOPIC INCISIONAL HERNIA;  Surgeon: Olean Ree, MD;  Location: [facility] ORS;  Service: General;  Laterality: N/A;   Family History  Problem Relation Age of Onset   Cancer Mother        cancer   Breast cancer Mother 53   Social History   Socioeconomic History   Marital status: Married    Spouse name: Oluwakemi Starke   Number of children: Not on file   Years of education: High  School   Highest education level: Not on file  Occupational History   Occupation: retired  Scientist, product/process development strain: Not hard at [employer]    Worry: Never true    Inability: Never true   Transportation needs    Medical: No    Non-medical: No  Tobacco Use   Smoking status: Never Smoker   Smokeless tobacco: Never Used  Substance and Sexual Activity   Alcohol use: No   Drug use: No   Sexual activity: Not on file  Lifestyle   Physical activity      Days per week: 0 days    Minutes per session: 0 min   Stress: Not at all  Relationships   Social connections    Talks on phone: More than three times a week    Gets together: More than three times a week    Attends religious service: More than 4 times per year    Active member of club or organization: No    Attends meetings of clubs or organizations: Never    Relationship status: Married  Other Topics Concern   Not on file  Social History Narrative   Not on file    Outpatient Encounter Medications as of 07/01/2019  Medication Sig   buPROPion (WELLBUTRIN XL) 150 MG 24 hr tablet Take 150 mg by mouth daily.    busPIRone (BUSPAR) 10 MG tablet Take 1 tablet (10 mg total) by mouth daily. Prescribed by Psychiatry Dr Kasandra Knudsen   cholecalciferol (VITAMIN D) 1000 units tablet Take 1,000 Units by mouth daily.   metoprolol succinate (TOPROL-XL) 25 MG 24 hr tablet Take 1 tablet (25 mg total) by mouth daily.   Multiple Vitamin (MULTI-VITAMINS) TABS Take 1 tablet by mouth daily.    omeprazole (PRILOSEC) 20 MG capsule TAKE 1 CAPSULE (20 MG TOTAL) BY MOUTH DAILY BEFORE BREAKFAST.   ondansetron (ZOFRAN-ODT) 4 MG disintegrating tablet TAKE 1 TABLET BY MOUTH EVERY 8 HOURS AS NEEDED FOR NAUSEA AND VOMITING   PROAIR HFA 108 (90 Base) MCG/ACT inhaler INHALE 2 PUFFS INTO THE LUNGS EVERY 4 HOURS AS NEEDED FOR WHEEZING OR SHORTNESS OF BREATH (COUGH)   simvastatin (ZOCOR) 20 MG tablet Take 1 tablet (20 mg total) by mouth daily.   venlafaxine XR (EFFEXOR-XR) 75 MG 24 hr capsule Take 75 mg by mouth daily with breakfast.    vitamin A 10000 UNIT capsule Take 10,000 Units by mouth daily.   vitamin C (ASCORBIC ACID) 500 MG tablet Take 500 mg by mouth daily.   diclofenac sodium (VOLTAREN) 1 % GEL Apply 4 g topically 4 (four) times daily. (Patient not taking: Reported on 07/01/2019)   OLANZapine (ZYPREXA) 5 MG tablet Take 5 mg by mouth at bedtime.    [DISCONTINUED] OLANZapine (ZYPREXA) 7.5 MG tablet TAKE  1 TABLET BY MOUTH EVERYDAY AT BEDTIME   No facility-administered encounter medications on file as of 07/01/2019.     Activities of Daily Living In your present state of health, do you have any difficulty performing the following activities: 07/01/2019 12/26/2018  Hearing? N Y  Comment hearing aids -  Vision? Y N  Comment macular degeneration. eyeglasses, goes to [facility] -  Difficulty concentrating or making decisions? Y N  Comment comes back -  Walking or climbing stairs? Y N  Dressing or bathing? N N  Comment husband helps -  Doing errands, shopping? Tempie Donning  Comment family drives -  Conservation officer, nature and eating ? N -  Using the Toilet? N -  In the past six months, have you accidently leaked urine? N -  Do you have problems with loss of bowel control? N -  Managing your Medications? N -  Managing your Finances? N -  Housekeeping or managing your Housekeeping? N -  Some recent data might be hidden    Patient Care Team: Olin Hauser, DO as PCP - General (Family Medicine) Myer Haff, MD as Referring Physician (Psychiatry)    Assessment:   This is a routine wellness examination for [place].  Exercise Activities and Dietary recommendations Current Exercise Habits: The patient does not participate in regular exercise at present, Exercise limited by: orthopedic condition(s)  Goals     DIET - INCREASE WATER INTAKE     Recommend drinking at least 6-8 glasses of water a day        Fall Risk: Fall Risk  07/01/2019 11/19/2018 10/21/2018 09/13/2018 05/14/2018  Falls in the past year? 1 0 0 0 No  Comment nov 2019 - - - -  Number falls in past yr: 0 - - - -  Injury with Fall? 1 - - - -  Follow up Education provided;Falls prevention discussed - Falls evaluation completed - -    FALL RISK PREVENTION PERTAINING TO THE HOME:  Any stairs in or around the home? No  If so, are there any without handrails? No   Home free of loose throw rugs in walkways, pet beds, electrical cords,  etc? Yes  Adequate lighting in your home to reduce risk of falls? Yes   ASSISTIVE DEVICES UTILIZED TO PREVENT FALLS:  Life alert? No  Use of a cane, walker or w/c? Yes  walker  Grab bars in the bathroom? No  Shower chair or bench in shower? Yes  Elevated toilet seat or a handicapped toilet? No   DME ORDERS:  DME order needed?  No   TIMED UP AND GO:  Unable to perform   Depression Screen PHQ 2/9 Scores 07/01/2019 01/02/2019 10/21/2018 10/01/2018  PHQ - 2 Score 0 6 0 1  PHQ- 9 Score - 18 - -  Exception Documentation - Patient refusal - -     Cognitive Function     6CIT Screen 05/14/2018  What Year? 0 points  What month? 0 points  What time? 0 points  Count back from 20 0 points  Months in reverse 0 points  Repeat phrase 2 points  Total Score 2    Immunization History  Administered Date(s) Administered   Influenza, High Dose Seasonal PF 08/13/2017, 12/27/2018   Pneumococcal Conjugate-13 05/21/2017   Tdap 11/14/2017    Qualifies for Shingles Vaccine? Yes  Zostavax completed n/a. Due for Shingrix. Education has been provided regarding the importance of this vaccine. Pt has been advised to call insurance company to determine out of pocket expense. Advised may also receive vaccine at local pharmacy or Health Dept. Verbalized acceptance and understanding.  Tdap: up to date   Flu Vaccine: Due 07/2019  Pneumococcal Vaccine: Due for Pneumococcal vaccine.Education has been provided regarding the importance of this vaccine but still declined. Advised may receive this vaccine at local pharmacy or Health Dept. Aware to provide a copy of the vaccination record if obtained from local pharmacy or Health Dept. Verbalized acceptance and understanding.   Screening Tests Health Maintenance  Topic Date Due   MAMMOGRAM  11/06/2017   PNA vac Low Risk Adult (2 of 2 - PPSV23) 05/21/2018   INFLUENZA VACCINE  06/07/2019  COLONOSCOPY  06/25/2021   TETANUS/TDAP  11/15/2027   DEXA  SCAN  Completed   Hepatitis C Screening  Completed    Cancer Screenings:  Colorectal Screening: Completed 06/25/2018. Repeat every 3 years  Mammogram: ordered   Bone Density: Completed 12/30/2018.  Lung Cancer Screening: (Low Dose CT Chest recommended if Age 40-80 years, 30 pack-year currently smoking OR have quit w/in 15years.) does not qualify.    Additional Screening:  Hepatitis C Screening: does qualify; Completed 05/21/2017  Vision Screening: Recommended annual ophthalmology exams for early detection of glaucoma and other disorders of the eye. Is the patient up to date with their annual eye exam?  Yes  Who is the provider or what is the name of the office in which the pt attends annual eye exams? [facility]   Dental Screening: Recommended annual dental exams for proper oral hygiene  Community Resource Referral:  CRR required this visit?  No       Plan:  I have personally reviewed and addressed the Medicare Annual Wellness questionnaire and have noted the following in the patients chart:  A. Medical and social history B. Use of alcohol, tobacco or illicit drugs  C. Current medications and supplements D. Functional ability and status E.  Nutritional status F.  Physical activity G. Advance directives H. List of other physicians I.  Hospitalizations, surgeries, and ER visits in previous 12 months J.  [place] such as hearing and vision if needed, cognitive and depression L. Referrals and appointments   In addition, I have reviewed and discussed with patient certain preventive protocols, quality metrics, and best practice recommendations. A written personalized care plan for preventive services as well as general preventive health recommendations were provided to patient.  Signed,    Bevelyn Ngo, LPN  [id number] Nurse Health Advisor   Nurse Notes: needs refill on zyprexa, isnt sure who originally prescribed it.

## 2019-07-02 NOTE — Telephone Encounter (Signed)
[facility] will Rx Zyprexa.

## 2019-07-02 NOTE — Telephone Encounter (Signed)
Called [facility] and Left VM of situation asking that they call patient direct after reviewing chart to see if he prescribed in past or what to do moving forward since we can not prescribe.

## 2019-07-02 NOTE — Telephone Encounter (Signed)
Patient  Pamela Ball she has been on this before about a year ago. She asked Dr. Kasandra Knudsen for this and he said he has never heard of it. I advised her to call and ask Glass blower/designer or Nurse to review chart to see why he is saying this. She said she can not read so she is unable to call them. Advised I will try to contact them or Asa Lente will and we will work on this for few days and see if we can figure out where confusion is.

## 2019-07-03 NOTE — Progress Notes (Signed)
error 

## 2019-07-25 ENCOUNTER — Emergency Department: Payer: Medicare Other

## 2019-07-25 ENCOUNTER — Other Ambulatory Visit: Payer: Self-pay

## 2019-07-25 ENCOUNTER — Encounter: Payer: Self-pay | Admitting: Emergency Medicine

## 2019-07-25 ENCOUNTER — Emergency Department
Admission: EM | Admit: 2019-07-25 | Discharge: 2019-07-25 | Disposition: A | Discharge status: Home / Self Care | Payer: Medicare Other | Attending: Student in an Organized Health Care Education/Training Program | Admitting: Student in an Organized Health Care Education/Training Program

## 2019-07-25 DIAGNOSIS — Y939 Activity, unspecified: Secondary | ICD-10-CM | POA: Insufficient documentation

## 2019-07-25 DIAGNOSIS — Y999 Unspecified external cause status: Secondary | ICD-10-CM | POA: Diagnosis not present

## 2019-07-25 DIAGNOSIS — Y92481 Parking lot as the place of occurrence of the external cause: Secondary | ICD-10-CM | POA: Insufficient documentation

## 2019-07-25 DIAGNOSIS — Z79899 Other long term (current) drug therapy: Secondary | ICD-10-CM | POA: Diagnosis not present

## 2019-07-25 DIAGNOSIS — S42472A Displaced transcondylar fracture of left humerus, initial encounter for closed fracture: Secondary | ICD-10-CM | POA: Diagnosis not present

## 2019-07-25 DIAGNOSIS — S0990XA Unspecified injury of head, initial encounter: Secondary | ICD-10-CM | POA: Diagnosis not present

## 2019-07-25 DIAGNOSIS — S4992XA Unspecified injury of left shoulder and upper arm, initial encounter: Secondary | ICD-10-CM | POA: Diagnosis present

## 2019-07-25 DIAGNOSIS — W101XXA Fall (on)(from) sidewalk curb, initial encounter: Secondary | ICD-10-CM | POA: Insufficient documentation

## 2019-07-25 DIAGNOSIS — T148XXA Other injury of unspecified body region, initial encounter: Secondary | ICD-10-CM

## 2019-07-25 MED ORDER — OXYCODONE-ACETAMINOPHEN 5-325 MG PO TABS
1.0000 | ORAL_TABLET | Freq: Once | ORAL | Status: AC
  Administered 2019-07-25: 1 via ORAL
  Filled 2019-07-25: qty 1

## 2019-07-25 MED ORDER — OXYCODONE-ACETAMINOPHEN 5-325 MG PO TABS: 1.0000 | ORAL_TABLET | Freq: Four times a day (QID) | ORAL | 0 refills | Status: AC | PRN

## 2019-07-25 NOTE — ED Provider Notes (Signed)
[facility] Emergency Department Provider Note  ____________________________________________  Time seen: Approximately 11:31 AM  I have reviewed the triage vital signs and the nursing notes.   HISTORY  Chief Complaint Arm Pain and Fall    HPI Pamela Ball is a 73 y.o. female that presents to the emergency department for evaluation of left elbow pain after fall today.  Patient states that she tripped over a bank.  She hit her forehead, her left elbow and both of her knees.  Left elbow is the most painful.  She states that her knees were just scraped.  Last tetanus shot was 1 year ago.  She is not on any blood thinners.  Patient does not use a cane or a walker.  She is left-handed.  She plays the piano, which is important to her.  No headache, dizziness, neck pain, shortness of breath, chest pain, abdominal pain, hand numbness, hand tingling.   Past Medical History:  Diagnosis Date  . Abdominal hernia with obstruction and without gangrene   . Anxiety 02/15/2017  . Bipolar 1 disorder, depressed, full remission ([place]) 02/15/2017  . Bipolar affective disorder ([place])   . Depression   . Dyspnea   . GERD (gastroesophageal reflux disease) 02/15/2017  . Glaucoma   . Hiatal hernia   . History of abdominal hernia 05/21/2017  . History of compression fracture of spine 02/15/2017  . Hyperlipidemia   . Incisional hernia   . Incisional ventral hernia w obstruction 06/02/2017  . Normocytic anemia 05/23/2017  . 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 Presbycusis of both ears 02/15/2017    Patient Active Problem List   Diagnosis Date Noted  . Osteopenia of right foot 02/11/2019  . Malnutrition of moderate degree 12/27/2018  . Lumbar hernia 11/19/2018  . Elevated troponin 09/26/2018  . Incisional hernia, without obstruction or gangrene 08/28/2018  . Allergic rhinitis due to allergen 05/14/2018  . Abdominal hernia with obstruction and without gangrene   .  Normocytic anemia 05/23/2017  . History of abdominal hernia 05/21/2017  . Bipolar 1 disorder, depressed, full remission ([place]) 02/15/2017  . GERD (gastroesophageal reflux disease) 02/15/2017  . Centrilobular emphysema ([place]) 02/15/2017  . 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 Hyperlipidemia 02/15/2017  . Presbycusis of both ears 02/15/2017  . History of compression fracture of spine 02/15/2017  . Anxiety 02/15/2017    Past Surgical History:  Procedure Laterality Date  . ABDOMINAL HYSTERECTOMY  11/06/2006  . CESAREAN SECTION     x3  . COLONOSCOPY WITH PROPOFOL N/A 06/25/2018   Procedure: COLONOSCOPY WITH PROPOFOL;  Surgeon: Jonathon Bellows, MD;  Location: [facility] ENDOSCOPY;  Service: Endoscopy;  Laterality: N/A;  . KYPHOPLASTY    . KYPHOPLASTY N/A 09/23/2018   Procedure: KYPHOPLASTY T10;  Surgeon: Hessie Knows, MD;  Location: [facility] ORS;  Service: Orthopedics;  Laterality: N/A;  . VENTRAL HERNIA REPAIR N/A 06/02/2017   Procedure: exploratory laparotomy repair of incarcerated  VENTRAL hernia;  Surgeon: Florene Glen, MD;  Location: [facility] ORS;  Service: General;  Laterality: N/A;  . VENTRAL HERNIA REPAIR N/A 08/29/2018   Procedure: LAPAROSCOPIC INCISIONAL HERNIA;  Surgeon: Olean Ree, MD;  Location: [facility] ORS;  Service: General;  Laterality: N/A;    Prior to Admission medications   Medication Sig Start Date End Date Taking? Authorizing Provider  buPROPion (WELLBUTRIN XL) 150 MG 24 hr tablet Take 150 mg by mouth daily.  01/11/15   [provider]  busPIRone (BUSPAR) 10 MG tablet Take 1 tablet (10  mg total) by mouth daily. Prescribed by Psychiatry Dr Kasandra Knudsen 11/14/17   Parks Ranger, Devonne Doughty, DO  cholecalciferol (VITAMIN D) 1000 units tablet Take 1,000 Units by mouth daily.    [provider]  metoprolol succinate (TOPROL-XL) 25 MG 24 hr tablet Take 1 tablet (25 mg total) by mouth daily. 09/27/18 07/01/19  Henreitta Leber, MD  Multiple Vitamin (MULTI-VITAMINS) TABS Take 1  tablet by mouth daily.     [provider]  OLANZapine (ZYPREXA) 5 MG tablet Take 5 mg by mouth at bedtime.  01/11/15   [provider]  omeprazole (PRILOSEC) 20 MG capsule TAKE 1 CAPSULE (20 MG TOTAL) BY MOUTH DAILY BEFORE BREAKFAST. 05/01/19   Karamalegos, Alexander J, DO  ondansetron (ZOFRAN-ODT) 4 MG disintegrating tablet TAKE 1 TABLET BY MOUTH EVERY 8 HOURS AS NEEDED FOR NAUSEA AND VOMITING 06/23/19   Parks Ranger, Devonne Doughty, DO  oxyCODONE-acetaminophen (PERCOCET) 5-325 MG tablet Take 1 tablet by mouth every 6 (six) hours as needed for up to 5 days for severe pain. 07/25/19 07/30/19  Laban Emperor, PA-C  PROAIR HFA 108 (90 Base) MCG/ACT inhaler INHALE 2 PUFFS INTO THE LUNGS EVERY 4 HOURS AS NEEDED FOR WHEEZING OR SHORTNESS OF BREATH (COUGH) 06/03/19   Parks Ranger, Devonne Doughty, DO  simvastatin (ZOCOR) 20 MG tablet Take 1 tablet (20 mg total) by mouth daily. 06/10/19   Karamalegos, Devonne Doughty, DO  venlafaxine XR (EFFEXOR-XR) 75 MG 24 hr capsule Take 75 mg by mouth daily with breakfast.  01/11/15   [provider]  vitamin A 10000 UNIT capsule Take 10,000 Units by mouth daily.    [provider]  vitamin C (ASCORBIC ACID) 500 MG tablet Take 500 mg by mouth daily.    [provider]    Allergies Codeine  Family History  Problem Relation Age of Onset  . Cancer Mother        cancer  . Breast cancer Mother 38    Social History Social History   Tobacco Use  . Smoking status: Never Smoker  . Smokeless tobacco: Never Used  Substance Use Topics  . Alcohol use: No  . Drug use: No     Review of Systems  Cardiovascular: No chest pain. Respiratory: No SOB. Gastrointestinal: No abdominal pain.  No nausea, no vomiting.  Musculoskeletal: Negative for musculoskeletal pain. Skin: Negative for rash, abrasions, lacerations, ecchymosis.   ____________________________________________   PHYSICAL EXAM:  VITAL SIGNS: ED Triage Vitals  Enc Vitals Group      BP 07/25/19 1110 (!) 106/57     Pulse Rate 07/25/19 1110 77     Resp 07/25/19 1110 17     Temp 07/25/19 1110 98 F (36.7 C)     Temp Source 07/25/19 1110 Oral     SpO2 07/25/19 1110 95 %     Weight 07/25/19 1104 120 lb (54.4 kg)     Height 07/25/19 1104 4\' 10"  (1.473 m)     Head Circumference --      Peak Flow --      Pain Score 07/25/19 1104 10     Pain Loc --      Pain Edu? --      Excl. in [place]? --      Constitutional: Alert and oriented. Well appearing and in no acute distress. Eyes: Conjunctivae are normal. PERRL. EOMI. Head: Ecchymosis to left eyebrow. ENT:      Ears:      Nose: No congestion/rhinnorhea.      Mouth/Throat: Mucous membranes  are moist.  Neck: No stridor.  No cervical spine tenderness to palpation. Cardiovascular: Normal rate, regular rhythm.  Good peripheral circulation.  Symmetric radial pulses bilaterally. Respiratory: Normal respiratory effort without tachypnea or retractions. Lungs CTAB. Good air entry to the bases with no decreased or absent breath sounds.   Musculoskeletal: Full range of motion to all extremities. No gross deformities appreciated.  Limited range of left elbow due to pain.  Grip strength of hands intact. Neurologic:  Normal speech and language. No gross focal neurologic deficits are appreciated.  Skin:  Skin is warm, dry and intact.  Abrasions to left elbow and bilateral knees. Psychiatric: Mood and affect are normal. Speech and behavior are normal. Patient exhibits appropriate insight and judgement.   ____________________________________________   LABS (all labs ordered are listed, but only abnormal results are displayed)  Labs Reviewed - No data to display ____________________________________________  EKG   ____________________________________________  RADIOLOGY Robinette Haines, personally viewed and evaluated these images (plain radiographs) as part of my medical decision making, as well as reviewing the written report  by the radiologist.  Dg Elbow Complete Left  Result Date: 07/25/2019 CLINICAL DATA:  Left elbow injury due to a trip and fall today. Initial encounter. EXAM: LEFT ELBOW - COMPLETE 3+ VIEW COMPARISON:  None. FINDINGS: The patient has a comminuted transcondylar fracture of the distal humerus. There is approximately 1 shaft width posterior displacement of the shaft of the humerus. A fragment of trochlea is superiorly displaced approximately 1.5 cm. Associated soft tissue swelling and elbow joint effusion noted. IMPRESSION: Comminuted and displaced transcondylar fracture of the distal humerus. Electronically Signed   By: Inge Rise M.D.   On: 07/25/2019 13:04   Ct Head Wo Contrast  Result Date: 07/25/2019 CLINICAL DATA:  Head trauma, minor, GCS less than or equal to 13, low clinical risk, initial exam. Additional history provided: Fall. EXAM: CT HEAD WITHOUT CONTRAST TECHNIQUE: Contiguous axial images were obtained from the base of the skull through the vertex without intravenous contrast. COMPARISON:  Head CT 01/20/2015 FINDINGS: Brain: There is no acute intracranial hemorrhage. No demarcated cortical infarction. No evidence of intracranial mass. No midline shift or extra-axial fluid collection. 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 Mild generalized parenchymal atrophy. Partially empty sella turcica. Vascular: No hyperdense vessel Skull: No calvarial fracture. Sinuses/Orbits: Imaged globes and orbits demonstrate no acute abnormality. Polypoid mucosal thickening within the sphenoid sinus. Right mastoid effusion. IMPRESSION: No evidence of acute intracranial abnormality. 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 Mild generalized parenchymal atrophy. Sphenoid sinus disease. Right mastoid effusion. Electronically Signed   By: Kellie Simmering   On: 07/25/2019 12:53   Ct Elbow Left Wo Contrast  Result Date: 07/25/2019 CLINICAL DATA:  Comminuted elbow fracture, planning EXAM: CT OF THE UPPER LEFT EXTREMITY WITHOUT CONTRAST; 3D reformats of the elbow TECHNIQUE: Multidetector CT imaging of the upper left extremity was performed according to the standard protocol. 3D reformats were constructed. COMPARISON:  Radiograph same day FINDINGS: Bones/Joint/Cartilage There is a highly comminuted transcondylar intra-articular fracture of the distal humerus which involves both the capitellar and trochlear surfaces. The trochlea still articulates with the ulna and the capitellum articulates with the radial head however. There is impaction of the distal humerus shaft on the condyles. There is slight anterior angulation of the condyles at the elbow.  No other fracture is identified. Ligaments Suboptimally assessed by CT. Muscles and Tendons The muscles appear to be grossly intact. There is limited visualization of the tendons. Soft tissues There is diffuse soft tissue swelling seen around the elbow. A small elbow joint effusion is seen. IMPRESSION: Comminuted intra-articular transcondylar fracture as described above. Electronically Signed   By: Prudencio Pair M.D.   On: 07/25/2019 15:14   Ct 3d Recon At Scanner  Result Date: 07/25/2019 CLINICAL DATA:  Comminuted elbow fracture, planning EXAM: CT OF THE UPPER LEFT EXTREMITY WITHOUT CONTRAST; 3D reformats of the elbow TECHNIQUE: Multidetector CT imaging of the upper left extremity was performed according to the standard protocol. 3D reformats were constructed. COMPARISON:  Radiograph same day FINDINGS: Bones/Joint/Cartilage There is a highly comminuted transcondylar intra-articular fracture of the distal humerus which involves both the capitellar and trochlear surfaces. The trochlea still articulates with the ulna and the capitellum articulates with the radial head however. There is impaction of the distal  humerus shaft on the condyles. There is slight anterior angulation of the condyles at the elbow. No other fracture is identified. Ligaments Suboptimally assessed by CT. Muscles and Tendons The muscles appear to be grossly intact. There is limited visualization of the tendons. Soft tissues There is diffuse soft tissue swelling seen around the elbow. A small elbow joint effusion is seen. IMPRESSION: Comminuted intra-articular transcondylar fracture as described above. Electronically Signed   By: Prudencio Pair M.D.   On: 07/25/2019 15:14    ____________________________________________    PROCEDURES  Procedure(s) performed:    Procedures    Medications  oxyCODONE-acetaminophen (PERCOCET/ROXICET) 5-325 MG per tablet 1 tablet (1 tablet Oral Given 07/25/19 1235)  oxyCODONE-acetaminophen (PERCOCET/ROXICET) 5-325 MG per tablet 1 tablet (1 tablet Oral Given 07/25/19 1627)     ____________________________________________   INITIAL IMPRESSION / ASSESSMENT AND PLAN / ED COURSE  Pertinent labs & imaging results that were available during my care of the patient were reviewed by me and considered in my medical decision making (see chart for details).  Review of the [place] CSRS was performed in accordance of the [place] prior to dispensing any controlled drugs.   Patient presented to emergency department for evaluation after fall.  Vital signs and exam are reassuring.  CT head is negative for acute abnormalities and chronic CT findings were discussed with patient.  Patient was unaware of any previous incident and denies any previous or current neurological symptoms. She will follow-up with primary care.  Elbow x-ray consistent of comminuted and displaced transcondylar fracture of the distal humerus.  Patient is neurovascularly intact.  Dr. Marry Guan was consulted and recommends CT scan with plans of surgery next week.  Dr. Marry Guan personally reviewed CT scans.  Dr. Marry Guan has consulted with [facility] in  [place] for plan of care.  Dr. Marry Guan will follow-up with patient early next week with plans of surgery.  Posterior splint was placed.  Arm sling was given.  Patient will be discharged home with prescriptions for Percocet. Patient is to follow up with orthopedics as directed. Patient is agreeable. Patient is given ED precautions to return to the ED for any worsening or new symptoms.  Pamela Ball was evaluated in Emergency Department on 07/25/2019 for the symptoms described in the history of present illness. She was evaluated in the context of the global COVID-19 pandemic, which necessitated consideration that the patient might be at risk for infection with the SARS-CoV-2 virus that causes COVID-19. Institutional protocols and algorithms that pertain to  the evaluation of patients at risk for COVID-19 are in a state of rapid change based on information released by regulatory bodies including the CDC and federal and state organizations. These policies and algorithms were followed during the patient's care in the ED.   ____________________________________________  FINAL CLINICAL IMPRESSION(S) / ED DIAGNOSES  Final diagnoses:  Closed displaced transcondylar fracture of left humerus, initial encounter      NEW MEDICATIONS STARTED DURING THIS VISIT:  ED Discharge Orders         Ordered    oxyCODONE-acetaminophen (PERCOCET) 5-325 MG tablet  Every 6 hours PRN     07/25/19 1619              This chart was dictated using voice recognition software/Dragon. Despite best efforts to proofread, errors can occur which can change the meaning. Any change was purely unintentional.    Laban Emperor, PA-C 07/25/19 1919    Merlyn Lot, MD 07/28/19 1004

## 2019-07-25 NOTE — ED Notes (Signed)
Son at bedside at this time. Son provided with update.

## 2019-07-25 NOTE — Discharge Instructions (Addendum)
You have a large fracture in your elbow that will likely need surgery next week.  Please wear elbow splint and use sling.  Please apply ice over top of the sling as much as possible.  You can take Percocet for pain.  If you do not hear from Dr. Clydell Hakim office on Monday morning, please call for an appointment.  Your CT scan of your head does not show any fracture or acute abnormality.  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  Please call primary care next week for an appointment next week.

## 2019-07-25 NOTE — ED Triage Notes (Signed)
[place] and fell injuring left elbow.  She has scraped knees, but says they are okay.

## 2019-07-30 DIAGNOSIS — S42402A Unspecified fracture of lower end of left humerus, initial encounter for closed fracture: Secondary | ICD-10-CM | POA: Diagnosis not present

## 2019-08-04 ENCOUNTER — Ambulatory Visit: Payer: Medicare Other | Admitting: Cardiovascular Disease

## 2019-08-05 ENCOUNTER — Other Ambulatory Visit
Admission: RE | Admit: 2019-08-05 | Discharge: 2019-08-05 | Disposition: A | Discharge status: Home / Self Care | Payer: Medicare Other | Source: Ambulatory Visit | Attending: Orthopedic Surgery | Admitting: Orthopedic Surgery

## 2019-08-05 ENCOUNTER — Other Ambulatory Visit: Payer: Self-pay

## 2019-08-05 DIAGNOSIS — Z20828 Contact with and (suspected) exposure to other viral communicable diseases: Secondary | ICD-10-CM | POA: Diagnosis not present

## 2019-08-05 DIAGNOSIS — Z01812 Encounter for preprocedural laboratory examination: Secondary | ICD-10-CM | POA: Diagnosis not present

## 2019-08-05 LAB — SARS CORONAVIRUS 2 (TAT 6-24 HRS): SARS Coronavirus 2: NEGATIVE

## 2019-08-06 ENCOUNTER — Other Ambulatory Visit: Payer: Medicare Other

## 2019-08-06 ENCOUNTER — Other Ambulatory Visit (HOSPITAL_COMMUNITY): Payer: Medicare Other

## 2019-08-06 ENCOUNTER — Encounter (HOSPITAL_COMMUNITY): Payer: Self-pay | Admitting: *Deleted

## 2019-08-06 ENCOUNTER — Other Ambulatory Visit: Payer: Self-pay

## 2019-08-06 NOTE — Anesthesia Preprocedure Evaluation (Addendum)
Anesthesia Evaluation    Airway Mallampati: II  TM Distance: >3 FB Neck ROM: Full    Dental no notable dental hx.    Pulmonary COPD,  CXR 12/26/18: FINDINGS: Mediastinum hilar structures normal. Heart size normal. Low lung volumes. No focal infiltrate. No pleural effusion or pneumothorax. Degenerative change thoracic spine with kyphoscoliosis again noted.  hiatal hernia. IMPRESSION: Low lung volumes. Otherwise no acute cardiopulmonary disease.   Pulmonary exam normal breath sounds clear to auscultation       Cardiovascular Normal cardiovascular exam Rhythm:Regular Rate:Normal  EKG: 11/12/18: Sinus rhythm with occasional Premature ventricular complexes Incomplete right bundle branch block Borderline ECG - RBBB present on 09/26/18 EKG   CV: Echo 09/27/18: Study Conclusions - Left ventricle: The cavity size was normal. Systolic function was normal. The estimated ejection fraction was in the range of 60% to 65%. Wall motion was normal; there were no regional wall motion abnormalities. Doppler parameters are consistent with abnormal left ventricular relaxation (grade 1 diastolic dysfunction).   Neuro/Psych  Headaches, PSYCHIATRIC DISORDERS Anxiety Depression Bipolar Disorder    GI/Hepatic hiatal hernia, GERD  ,  Endo/Other    Renal/GU      Musculoskeletal  (+) Arthritis , Osteoarthritis,    Abdominal   Peds  Hematology  (+) Blood dyscrasia, anemia ,   Anesthesia Other Findings   Reproductive/Obstetrics                            Anesthesia Physical Anesthesia Plan  ASA: III  Anesthesia Plan: General   Post-op Pain Management: GA combined w/ Regional for post-op pain   Induction: Intravenous  PONV Risk Score and Plan: 3 and Ondansetron, Dexamethasone and Treatment may vary due to age or medical condition  Airway Management Planned: LMA  Additional Equipment:    Intra-op Plan:   Post-operative Plan: Extubation in OR  Informed Consent:   Plan Discussed with: CRNA, Surgeon and Anesthesiologist  Anesthesia Plan Comments: (PAT note written 08/06/2019 by Myra Gianotti, PA-C. See note. SAME DAY WORK-UP   )       Anesthesia Quick Evaluation

## 2019-08-06 NOTE — Progress Notes (Signed)
Anesthesia Chart Review: SAME DAY WORK-UP   Case: [id number] Date/Time: 08/07/19 1115   Procedures:      OPEN REDUCTION INTERNAL FIXATION (ORIF) LEFT DISTAL HUMERUS FRACTURE (Left )     EXTERNAL FIXATION ARM (Left )   Anesthesia type: General   Pre-op diagnosis: LEFT DISTAL HUMERUS FRACTURE   Location: [facility] OR ROOM 03 / [facility]   Surgeon: Altamese [place], MD      DISCUSSION: Patient is a 73 year old female scheduled for the above procedure. She fell on 07/25/19 and sustained a comminuted left elbow fracture. Discharged with posterior splint and arm sling with plans for out-patient surgery.   History includes never smoker, HLD, glaucoma, Bipolar affective disorder, GERD, normocytic anemia, hiatal hernia, incisional hernia.   - Admitted 12/26/18-12/27/18 for COPD exacerbation  - Admitted 09/26/18-09/27/18 for back pain after falling out of bed the day after her kyphoplasty. Troponin was 0.65-->0.89-->0.33-->0.57. She denied any signficant SOB or chest pain. Cardiologist Ida Rogue, MD was consulted. Elevated troponin possibly stress-induced but unable to exclude underlying CAD. EKG showed a new RBBB. He discussed possibility of ischemic work-up--patient did not want to have a cardiac cath and was hesitant to proceed with stress testing--and ultimately refused further testing and wanted medical therapy. Echo did show normal EF 60-65%, no regional wall motion abnormalities. He recommended ASA, statin, and b-blocker with PCP follow-up. ([name] told PAT phone nurse that she needs hernia surgery, but was told she may need cardiology re-evaluation or stress test prior.)  - S/p T10 kyphoplasty 09/23/18   08/05/19 COVID-19 test negative.  Discussed above with anesthesiologist Oleta Mouse, MD. Recommendations to let Dr. Rockey Situ know about surgery plans as an FYI (will send staff message), but otherwise given acute nature of injury, patient will be evaluated on the day of surgery. If any  concerns during day of surgery evaluation then would defer to anesthesiologist decision regarding more formal cardiology input.   PROVIDERS: Olin Hauser, DO is PCP   LABS: For day of surgery.    IMAGES: CT OF THE UPPER LEFT EXTREMITY WITHOUT CONTRAST 07/25/19: FINDINGS: - Bones/Joint/Cartilage There is a highly comminuted transcondylar intra-articular fracture of the distal humerus which involves both the capitellar and trochlear surfaces. The trochlea still articulates with the ulna and the capitellum articulates with the radial head however. There is impaction of the distal humerus shaft on the condyles. There is slight anterior angulation of the condyles at the elbow. No other fracture is identified. - Ligaments Suboptimally assessed by CT. - Muscles and Tendons The muscles appear to be grossly intact. There is limited visualization of the tendons. - Soft tissues There is diffuse soft tissue swelling seen around the elbow. A small elbow joint effusion is seen. - IMPRESSION: Comminuted intra-articular transcondylar fracture as described above.  CXR 12/26/18: FINDINGS: Mediastinum hilar structures normal. Heart size normal. Low lung volumes. No focal infiltrate. No pleural effusion or pneumothorax. Degenerative change thoracic spine with kyphoscoliosis again noted. Multiple prior vertebroplasties. Old healed fracture right humerus. 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 Similar findings noted on prior exam. Sliding hiatal hernia. IMPRESSION: Low lung volumes.  Otherwise no acute cardiopulmonary disease.   EKG: 11/12/18: Sinus rhythm with occasional Premature ventricular complexes Incomplete right bundle branch block Borderline ECG - RBBB present on 09/26/18 EKG   CV: Echo 09/27/18: Study Conclusions - Left ventricle: The cavity size was normal. Systolic function was   normal. The estimated ejection  fraction was in the range of  60%   to 65%. Wall motion was normal; there were no regional wall   motion abnormalities. Doppler parameters are consistent with   abnormal left ventricular relaxation (grade 1 diastolic   dysfunction). - Aortic valve: There was mild to moderate regurgitation. - Mitral valve: There was mild regurgitation. - Left atrium: The atrium was normal in size. - Right ventricle: Systolic function was normal. - Pulmonary arteries: Systolic pressure was within the normal   range.   Past Medical History:  Diagnosis Date  . Abdominal hernia with obstruction and without gangrene   . Anxiety 02/15/2017   denies  . Bipolar 1 disorder, depressed, full remission ([place]) 02/15/2017  . Bipolar affective disorder ([place])   . Depression   . GERD (gastroesophageal reflux disease) 02/15/2017  . Glaucoma   . Headache   . Hiatal hernia   . History of abdominal hernia 05/21/2017  . History of compression fracture of spine 02/15/2017  . Hyperlipidemia   . Incisional hernia   . Incisional ventral hernia w obstruction 06/02/2017  . Normocytic anemia 05/23/2017  . 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 Presbycusis of both ears 02/15/2017    Past Surgical History:  Procedure Laterality Date  . ABDOMINAL HYSTERECTOMY  11/06/2006  . CESAREAN SECTION     x3  . COLONOSCOPY WITH PROPOFOL N/A 06/25/2018   Procedure: COLONOSCOPY WITH PROPOFOL;  Surgeon: Jonathon Bellows, MD;  Location[facility] ENDOSCOPY;  Service: Endoscopy;  Laterality: N/A;  . KYPHOPLASTY    . KYPHOPLASTY N/A 09/23/2018   Procedure: KYPHOPLASTY T10;  Surgeon: Hessie Knows, MD;  Location: [facility] ORS;  Service: Orthopedics;  Laterality: N/A;  . VENTRAL HERNIA REPAIR N/A 06/02/2017   Procedure: exploratory laparotomy repair of incarcerated  VENTRAL hernia;  Surgeon: Florene Glen, MD;  Location: [facility] ORS;  Service: General;  Laterality: N/A;  . VENTRAL HERNIA REPAIR N/A 08/29/2018   Procedure: LAPAROSCOPIC INCISIONAL  HERNIA;  Surgeon: Olean Ree, MD;  Location: [facility] ORS;  Service: General;  Laterality: N/A;    MEDICATIONS: No current facility-administered medications for this encounter.    Marland Kitchen buPROPion (WELLBUTRIN XL) 150 MG 24 hr tablet  . busPIRone (BUSPAR) 10 MG tablet  . cholecalciferol (VITAMIN D) 1000 units tablet  . metoprolol succinate (TOPROL-XL) 25 MG 24 hr tablet  . Multiple Vitamin (MULTI-VITAMINS) TABS  . OLANZapine (ZYPREXA) 5 MG tablet  . omeprazole (PRILOSEC) 20 MG capsule  . ondansetron (ZOFRAN-ODT) 4 MG disintegrating tablet  . PROAIR HFA 108 (90 Base) MCG/ACT inhaler  . simvastatin (ZOCOR) 20 MG tablet  . venlafaxine XR (EFFEXOR-XR) 75 MG 24 hr capsule  . vitamin A 10000 UNIT capsule  . vitamin C (ASCORBIC ACID) 500 MG tablet    Myra Gianotti, PA-C Surgical Short Stay/Anesthesiology [facility] Phone [phone] [facility] Phone [phone] 08/06/2019 5:08 PM

## 2019-08-07 ENCOUNTER — Inpatient Hospital Stay (HOSPITAL_COMMUNITY): Payer: Medicare Other | Admitting: Vascular Surgery

## 2019-08-07 ENCOUNTER — Ambulatory Visit (HOSPITAL_COMMUNITY)
Admission: RE | Admit: 2019-08-07 | Discharge: 2019-08-07 | Disposition: A | Discharge status: Home / Self Care | Payer: Medicare Other | Source: Ambulatory Visit | Attending: Orthopedic Surgery | Admitting: Orthopedic Surgery

## 2019-08-07 ENCOUNTER — Inpatient Hospital Stay (HOSPITAL_COMMUNITY): Payer: Medicare Other

## 2019-08-07 ENCOUNTER — Encounter (HOSPITAL_COMMUNITY)
Admission: RE | Disposition: A | Discharge status: Home / Self Care | Payer: Self-pay | Source: Ambulatory Visit | Attending: Orthopedic Surgery

## 2019-08-07 ENCOUNTER — Other Ambulatory Visit: Payer: Self-pay

## 2019-08-07 ENCOUNTER — Encounter (HOSPITAL_COMMUNITY): Payer: Self-pay

## 2019-08-07 DIAGNOSIS — Z79899 Other long term (current) drug therapy: Secondary | ICD-10-CM | POA: Insufficient documentation

## 2019-08-07 DIAGNOSIS — W19XXXA Unspecified fall, initial encounter: Secondary | ICD-10-CM | POA: Diagnosis not present

## 2019-08-07 DIAGNOSIS — H409 Unspecified glaucoma: Secondary | ICD-10-CM | POA: Insufficient documentation

## 2019-08-07 DIAGNOSIS — J449 Chronic obstructive pulmonary disease, unspecified: Secondary | ICD-10-CM | POA: Diagnosis not present

## 2019-08-07 DIAGNOSIS — Z7951 Long term (current) use of inhaled steroids: Secondary | ICD-10-CM | POA: Diagnosis not present

## 2019-08-07 DIAGNOSIS — T148XXA Other injury of unspecified body region, initial encounter: Secondary | ICD-10-CM

## 2019-08-07 DIAGNOSIS — S42492D Other displaced fracture of lower end of left humerus, subsequent encounter for fracture with routine healing: Secondary | ICD-10-CM | POA: Diagnosis not present

## 2019-08-07 DIAGNOSIS — S42472A Displaced transcondylar fracture of left humerus, initial encounter for closed fracture: Secondary | ICD-10-CM | POA: Insufficient documentation

## 2019-08-07 DIAGNOSIS — Z90710 Acquired absence of both cervix and uterus: Secondary | ICD-10-CM | POA: Insufficient documentation

## 2019-08-07 DIAGNOSIS — F319 Bipolar disorder, unspecified: Secondary | ICD-10-CM | POA: Insufficient documentation

## 2019-08-07 DIAGNOSIS — E785 Hyperlipidemia, unspecified: Secondary | ICD-10-CM | POA: Diagnosis not present

## 2019-08-07 DIAGNOSIS — I4510 Unspecified right bundle-branch block: Secondary | ICD-10-CM | POA: Insufficient documentation

## 2019-08-07 DIAGNOSIS — K219 Gastro-esophageal reflux disease without esophagitis: Secondary | ICD-10-CM | POA: Diagnosis not present

## 2019-08-07 DIAGNOSIS — R519 Headache, unspecified: Secondary | ICD-10-CM | POA: Insufficient documentation

## 2019-08-07 DIAGNOSIS — M419 Scoliosis, unspecified: Secondary | ICD-10-CM | POA: Diagnosis not present

## 2019-08-07 DIAGNOSIS — K449 Diaphragmatic hernia without obstruction or gangrene: Secondary | ICD-10-CM | POA: Diagnosis not present

## 2019-08-07 DIAGNOSIS — M1990 Unspecified osteoarthritis, unspecified site: Secondary | ICD-10-CM | POA: Insufficient documentation

## 2019-08-07 DIAGNOSIS — H9113 Presbycusis, bilateral: Secondary | ICD-10-CM | POA: Diagnosis not present

## 2019-08-07 DIAGNOSIS — D649 Anemia, unspecified: Secondary | ICD-10-CM | POA: Diagnosis not present

## 2019-08-07 DIAGNOSIS — G8918 Other acute postprocedural pain: Secondary | ICD-10-CM | POA: Diagnosis not present

## 2019-08-07 DIAGNOSIS — Z803 Family history of malignant neoplasm of breast: Secondary | ICD-10-CM | POA: Insufficient documentation

## 2019-08-07 DIAGNOSIS — M810 Age-related osteoporosis without current pathological fracture: Secondary | ICD-10-CM | POA: Insufficient documentation

## 2019-08-07 DIAGNOSIS — Z419 Encounter for procedure for purposes other than remedying health state, unspecified: Secondary | ICD-10-CM

## 2019-08-07 DIAGNOSIS — S52092D Other fracture of upper end of left ulna, subsequent encounter for closed fracture with routine healing: Secondary | ICD-10-CM | POA: Diagnosis not present

## 2019-08-07 DIAGNOSIS — F419 Anxiety disorder, unspecified: Secondary | ICD-10-CM | POA: Diagnosis not present

## 2019-08-07 HISTORY — DX: Headache, unspecified: R51.9

## 2019-08-07 HISTORY — PX: ORIF HUMERUS FRACTURE: SHX2126

## 2019-08-07 LAB — URINALYSIS, ROUTINE W REFLEX MICROSCOPIC
Bilirubin Urine: NEGATIVE
Glucose, UA: NEGATIVE mg/dL
Hgb urine dipstick: NEGATIVE
Ketones, ur: 5 mg/dL — AB
Leukocytes,Ua: NEGATIVE
Nitrite: NEGATIVE
Protein, ur: NEGATIVE mg/dL
Specific Gravity, Urine: 1.024 (ref 1.005–1.030)
pH: 5 (ref 5.0–8.0)

## 2019-08-07 LAB — COMPREHENSIVE METABOLIC PANEL
ALT: 15 U/L (ref 0–44)
AST: 22 U/L (ref 15–41)
Albumin: 3.6 g/dL (ref 3.5–5.0)
Alkaline Phosphatase: 108 U/L (ref 38–126)
Anion gap: 11 (ref 5–15)
BUN: 15 mg/dL (ref 8–23)
CO2: 26 mmol/L (ref 22–32)
Calcium: 8.8 mg/dL — ABNORMAL LOW (ref 8.9–10.3)
Chloride: 105 mmol/L (ref 98–111)
Creatinine, Ser: 0.73 mg/dL (ref 0.44–1.00)
GFR calc Af Amer: >60 mL/min (ref >60)
GFR calc non Af Amer: >60 mL/min (ref >60)
Glucose, Bld: 99 mg/dL (ref 70–99)
Potassium: 4 mmol/L (ref 3.5–5.1)
Sodium: 142 mmol/L (ref 135–145)
Total Bilirubin: 0.6 mg/dL (ref 0.3–1.2)
Total Protein: 6.7 g/dL (ref 6.5–8.1)

## 2019-08-07 LAB — APTT: aPTT: 30 seconds (ref 24–36)

## 2019-08-07 LAB — CBC WITH DIFFERENTIAL/PLATELET
Abs Immature Granulocytes: 0.01 10*3/uL (ref 0.00–0.07)
Basophils Absolute: 0 10*3/uL (ref 0.0–0.1)
Basophils Relative: 1 %
Eosinophils Absolute: 0.1 10*3/uL (ref 0.0–0.5)
Eosinophils Relative: 2 %
HCT: 46.8 % — ABNORMAL HIGH (ref 36.0–46.0)
Hemoglobin: 14.7 g/dL (ref 12.0–15.0)
Immature Granulocytes: 0 %
Lymphocytes Relative: 14 %
Lymphs Abs: 0.8 10*3/uL (ref 0.7–4.0)
MCH: 33.1 pg (ref 26.0–34.0)
MCHC: 31.4 g/dL (ref 30.0–36.0)
MCV: 105.4 fL — ABNORMAL HIGH (ref 80.0–100.0)
Monocytes Absolute: 0.6 10*3/uL (ref 0.1–1.0)
Monocytes Relative: 11 %
Neutro Abs: 4.1 10*3/uL (ref 1.7–7.7)
Neutrophils Relative %: 72 %
Platelets: 336 10*3/uL (ref 150–400)
RBC: 4.44 MIL/uL (ref 3.87–5.11)
RDW: 13.2 % (ref 11.5–15.5)
WBC: 5.7 10*3/uL (ref 4.0–10.5)
nRBC: 0 % (ref 0.0–0.2)

## 2019-08-07 LAB — TSH: TSH: 2.509 u[IU]/mL (ref 0.350–4.500)

## 2019-08-07 LAB — PREALBUMIN: Prealbumin: 26.4 mg/dL (ref 18–38)

## 2019-08-07 LAB — MAGNESIUM: Magnesium: 2.1 mg/dL (ref 1.7–2.4)

## 2019-08-07 LAB — PHOSPHORUS: Phosphorus: 3.1 mg/dL (ref 2.5–4.6)

## 2019-08-07 LAB — PROTIME-INR
INR: 1 (ref 0.8–1.2)
Prothrombin Time: 13.2 seconds (ref 11.4–15.2)

## 2019-08-07 LAB — VITAMIN D 25 HYDROXY (VIT D DEFICIENCY, FRACTURES): Vit D, 25-Hydroxy: 57.46 ng/mL (ref 30–100)

## 2019-08-07 SURGERY — OPEN REDUCTION INTERNAL FIXATION (ORIF) DISTAL HUMERUS FRACTURE
Anesthesia: General | Laterality: Left

## 2019-08-07 MED ORDER — BUPIVACAINE-EPINEPHRINE (PF) 0.5% -1:200000 IJ SOLN
INTRAMUSCULAR | Status: DC | PRN
  Administered 2019-08-07: 20 mL via PERINEURAL

## 2019-08-07 MED ORDER — DEXAMETHASONE SODIUM PHOSPHATE 10 MG/ML IJ SOLN
INTRAMUSCULAR | Status: DC | PRN
  Administered 2019-08-07: 10 mg

## 2019-08-07 MED ORDER — BUPIVACAINE LIPOSOME 1.3 % IJ SUSP
INTRAMUSCULAR | Status: DC | PRN
  Administered 2019-08-07: 10 mL via PERINEURAL

## 2019-08-07 MED ORDER — MIDAZOLAM HCL 2 MG/2ML IJ SOLN
INTRAMUSCULAR | Status: AC
  Filled 2019-08-07: qty 2

## 2019-08-07 MED ORDER — OXYCODONE HCL 5 MG/5ML PO SOLN: 5.0000 mg | Freq: Once | ORAL | Status: DC | PRN

## 2019-08-07 MED ORDER — ONDANSETRON HCL 4 MG/2ML IJ SOLN: 4.0000 mg | Freq: Once | INTRAMUSCULAR | Status: DC | PRN

## 2019-08-07 MED ORDER — FENTANYL CITRATE (PF) 100 MCG/2ML IJ SOLN
50.0000 ug | Freq: Once | INTRAMUSCULAR | Status: AC
  Administered 2019-08-07: 11:00:00 50 ug via INTRAVENOUS

## 2019-08-07 MED ORDER — LACTATED RINGERS IV SOLN
INTRAVENOUS | Status: DC
  Administered 2019-08-07: 10:00:00 via INTRAVENOUS

## 2019-08-07 MED ORDER — 0.9 % SODIUM CHLORIDE (POUR BTL) OPTIME
TOPICAL | Status: DC | PRN
  Administered 2019-08-07: 1000 mL

## 2019-08-07 MED ORDER — CHLORHEXIDINE GLUCONATE 4 % EX LIQD
60.0000 mL | Freq: Once | CUTANEOUS | Status: AC
  Administered 2019-08-07: 4 via TOPICAL

## 2019-08-07 MED ORDER — LIDOCAINE 2% (20 MG/ML) 5 ML SYRINGE
INTRAMUSCULAR | Status: DC | PRN
  Administered 2019-08-07: 40 mg via INTRAVENOUS

## 2019-08-07 MED ORDER — VANCOMYCIN HCL 1000 MG IV SOLR
INTRAVENOUS | Status: DC | PRN
  Administered 2019-08-07: 1000 mg via TOPICAL

## 2019-08-07 MED ORDER — OXYCODONE HCL 5 MG PO TABS: 5.0000 mg | ORAL_TABLET | Freq: Once | ORAL | Status: DC | PRN

## 2019-08-07 MED ORDER — SODIUM CHLORIDE 0.9 % IV SOLN
INTRAVENOUS | Status: DC | PRN
  Administered 2019-08-07: 13:00:00 10 ug/min via INTRAVENOUS

## 2019-08-07 MED ORDER — MEPERIDINE HCL 25 MG/ML IJ SOLN: 6.2500 mg | INTRAMUSCULAR | Status: DC | PRN

## 2019-08-07 MED ORDER — PROPOFOL 10 MG/ML IV BOLUS
INTRAVENOUS | Status: DC | PRN
  Administered 2019-08-07: 40 mg via INTRAVENOUS
  Administered 2019-08-07 (×2): 10 mg via INTRAVENOUS
  Administered 2019-08-07: 40 mg via INTRAVENOUS
  Administered 2019-08-07: 10 mg via INTRAVENOUS
  Administered 2019-08-07: 20 mg via INTRAVENOUS

## 2019-08-07 MED ORDER — METOPROLOL TARTRATE 50 MG PO TABS
ORAL_TABLET | ORAL | Status: AC
  Filled 2019-08-07: qty 1

## 2019-08-07 MED ORDER — POVIDONE-IODINE 10 % EX SWAB
2.0000 "application " | Freq: Once | CUTANEOUS | Status: AC
  Administered 2019-08-07: 2 via TOPICAL

## 2019-08-07 MED ORDER — FENTANYL CITRATE (PF) 100 MCG/2ML IJ SOLN: 25.0000 ug | INTRAMUSCULAR | Status: DC | PRN

## 2019-08-07 MED ORDER — FENTANYL CITRATE (PF) 250 MCG/5ML IJ SOLN
INTRAMUSCULAR | Status: AC
  Filled 2019-08-07: qty 5

## 2019-08-07 MED ORDER — GLYCOPYRROLATE PF 0.2 MG/ML IJ SOSY
PREFILLED_SYRINGE | INTRAMUSCULAR | Status: DC | PRN
  Administered 2019-08-07 (×2): .1 mg via INTRAVENOUS

## 2019-08-07 MED ORDER — PROPOFOL 10 MG/ML IV BOLUS
INTRAVENOUS | Status: AC
  Filled 2019-08-07: qty 20

## 2019-08-07 MED ORDER — FENTANYL CITRATE (PF) 100 MCG/2ML IJ SOLN
INTRAMUSCULAR | Status: DC | PRN
  Administered 2019-08-07: 25 ug via INTRAVENOUS

## 2019-08-07 MED ORDER — ACETAMINOPHEN 160 MG/5ML PO SOLN: 325.0000 mg | ORAL | Status: DC | PRN

## 2019-08-07 MED ORDER — CEFAZOLIN SODIUM-DEXTROSE 2-4 GM/100ML-% IV SOLN
2.0000 g | INTRAVENOUS | Status: AC
  Administered 2019-08-07: 2 g via INTRAVENOUS

## 2019-08-07 MED ORDER — ONDANSETRON 4 MG PO TBDP: 4.0000 mg | ORAL_TABLET | Freq: Three times a day (TID) | ORAL | 0 refills | Status: DC | PRN

## 2019-08-07 MED ORDER — LACTATED RINGERS IV SOLN
INTRAVENOUS | Status: DC | PRN
  Administered 2019-08-07 (×2): via INTRAVENOUS

## 2019-08-07 MED ORDER — ONDANSETRON HCL 4 MG/2ML IJ SOLN
INTRAMUSCULAR | Status: DC | PRN
  Administered 2019-08-07: 4 mg via INTRAVENOUS

## 2019-08-07 MED ORDER — VANCOMYCIN HCL 1000 MG IV SOLR
INTRAVENOUS | Status: AC
  Filled 2019-08-07: qty 1000

## 2019-08-07 MED ORDER — ROCURONIUM BROMIDE 50 MG/5ML IV SOSY
PREFILLED_SYRINGE | INTRAVENOUS | Status: DC | PRN
  Administered 2019-08-07: 5 mg via INTRAVENOUS
  Administered 2019-08-07: 10 mg via INTRAVENOUS
  Administered 2019-08-07: 40 mg via INTRAVENOUS

## 2019-08-07 MED ORDER — METOPROLOL TARTRATE 12.5 MG HALF TABLET
25.0000 mg | ORAL_TABLET | Freq: Once | ORAL | Status: AC
  Administered 2019-08-07: 25 mg via ORAL

## 2019-08-07 MED ORDER — OXYCODONE-ACETAMINOPHEN 5-325 MG PO TABS: 1.0000 | ORAL_TABLET | Freq: Four times a day (QID) | ORAL | 0 refills | Status: DC | PRN

## 2019-08-07 MED ORDER — FENTANYL CITRATE (PF) 100 MCG/2ML IJ SOLN
INTRAMUSCULAR | Status: AC
  Administered 2019-08-07: 50 ug via INTRAVENOUS
  Filled 2019-08-07: qty 2

## 2019-08-07 MED ORDER — ACETAMINOPHEN 325 MG PO TABS: 325.0000 mg | ORAL_TABLET | ORAL | Status: DC | PRN

## 2019-08-07 MED ORDER — CEFAZOLIN SODIUM-DEXTROSE 2-4 GM/100ML-% IV SOLN
INTRAVENOUS | Status: AC
  Filled 2019-08-07: qty 100

## 2019-08-07 SURGICAL SUPPLY — 89 items
BIT DRILL 2.0 LNG QUCK RELEASE (BIT) ×1 IMPLANT
BIT DRILL 2.8 QUICK RELEASE (BIT) ×1 IMPLANT
BLADE AVERAGE 25X9 (BLADE) IMPLANT
BNDG COHESIVE 4X5 TAN STRL (GAUZE/BANDAGES/DRESSINGS) ×2 IMPLANT
BNDG ELASTIC 4X5.8 VLCR STR LF (GAUZE/BANDAGES/DRESSINGS) ×2 IMPLANT
BNDG ESMARK 4X9 LF (GAUZE/BANDAGES/DRESSINGS) IMPLANT
BNDG GAUZE ELAST 4 BULKY (GAUZE/BANDAGES/DRESSINGS) ×4 IMPLANT
BRUSH SCRUB EZ PLAIN DRY (MISCELLANEOUS) ×4 IMPLANT
CORD BIPOLAR FORCEPS 12FT (ELECTRODE) IMPLANT
COVER SURGICAL LIGHT HANDLE (MISCELLANEOUS) ×2 IMPLANT
COVER WAND RF STERILE (DRAPES) ×2 IMPLANT
DRAIN PENROSE 1/4X12 LTX STRL (WOUND CARE) IMPLANT
DRAPE C-ARM 42X72 X-RAY (DRAPES) ×2 IMPLANT
DRAPE C-ARMOR (DRAPES) IMPLANT
DRAPE HALF SHEET 40X57 (DRAPES) IMPLANT
DRAPE INCISE IOBAN 66X45 STRL (DRAPES) IMPLANT
DRAPE ORTHO SPLIT 77X108 STRL (DRAPES) ×1
DRAPE SURG ORHT 6 SPLT 77X108 (DRAPES) ×1 IMPLANT
DRAPE U-SHAPE 47X51 STRL (DRAPES) ×4 IMPLANT
DRILL 2.0 LNG QUICK RELEASE (BIT) ×2
DRILL 2.8 QUICK RELEASE (BIT) ×2
DRSG ADAPTIC 3X8 NADH LF (GAUZE/BANDAGES/DRESSINGS) ×2 IMPLANT
DRSG MEPITEL 4X7.2 (GAUZE/BANDAGES/DRESSINGS) ×2 IMPLANT
DRSG PAD ABDOMINAL 8X10 ST (GAUZE/BANDAGES/DRESSINGS) ×2 IMPLANT
ELECT REM PT RETURN 9FT ADLT (ELECTROSURGICAL) ×2
ELECTRODE REM PT RTRN 9FT ADLT (ELECTROSURGICAL) ×1 IMPLANT
EVACUATOR 1/8 PVC DRAIN (DRAIN) IMPLANT
GAUZE SPONGE 4X4 12PLY STRL (GAUZE/BANDAGES/DRESSINGS) ×4 IMPLANT
GAUZE SPONGE 4X4 16PLY XRAY LF (GAUZE/BANDAGES/DRESSINGS) ×2 IMPLANT
GLOVE BIO SURGEON STRL SZ7.5 (GLOVE) ×2 IMPLANT
GLOVE BIOGEL PI IND STRL 7.5 (GLOVE) ×1 IMPLANT
GLOVE BIOGEL PI IND STRL 8 (GLOVE) ×1 IMPLANT
GLOVE BIOGEL PI INDICATOR 7.5 (GLOVE) ×1
GLOVE BIOGEL PI INDICATOR 8 (GLOVE) ×1
GOWN STRL REUS W/ TWL LRG LVL3 (GOWN DISPOSABLE) ×1 IMPLANT
GOWN STRL REUS W/ TWL XL LVL3 (GOWN DISPOSABLE) ×2 IMPLANT
GOWN STRL REUS W/TWL LRG LVL3 (GOWN DISPOSABLE) ×1
GOWN STRL REUS W/TWL XL LVL3 (GOWN DISPOSABLE) ×2
GUIDEWIRE ORTH 6X062XTROC NS (WIRE) ×4 IMPLANT
GUIDEWIRE ORTHO 2.0X9 ST (WIRE) ×2 IMPLANT
GUIDEWIRE ORTHO MINI ACTK .045 (WIRE) ×8 IMPLANT
K-WIRE .062 (WIRE) ×4
KIT BASIN OR (CUSTOM PROCEDURE TRAY) ×2 IMPLANT
KIT TURNOVER KIT B (KITS) ×2 IMPLANT
MANIFOLD NEPTUNE II (INSTRUMENTS) ×2 IMPLANT
NEEDLE HYPO 25X1 1.5 SAFETY (NEEDLE) IMPLANT
NS IRRIG 1000ML POUR BTL (IV SOLUTION) ×2 IMPLANT
PACK ORTHO EXTREMITY (CUSTOM PROCEDURE TRAY) ×2 IMPLANT
PAD ARMBOARD 7.5X6 YLW CONV (MISCELLANEOUS) ×4 IMPLANT
PAD CAST 4YDX4 CTTN HI CHSV (CAST SUPPLIES) ×1 IMPLANT
PADDING CAST COTTON 4X4 STRL (CAST SUPPLIES) ×1
PLATE HUMERUS 5H POST-LAT DIST (Plate) ×2 IMPLANT
PLATE LEFT OLECRANON 3HOLE (Plate) ×2 IMPLANT
PLATE MEDIAL LOCKING 7H 84MM (Plate) ×2 IMPLANT
SCREW HEX LOCK 2.7X14MM (Screw) ×6 IMPLANT
SCREW HEX LOCK 2.7X16MM (Screw) ×2 IMPLANT
SCREW HEXALOBE LOCKING 3.5X14M (Screw) ×2 IMPLANT
SCREW HEXALOBE NON-LOCK 3.5X16 (Screw) ×2 IMPLANT
SCREW HEXALOBE VA 3.5X44 (Screw) ×2 IMPLANT
SCREW LOCK 12X2.7X HEXALOBE (Screw) ×1 IMPLANT
SCREW LOCK 18X2.7X HEXALOBE (Screw) ×1 IMPLANT
SCREW LOCK 20X2.7X HEXALOBE (Screw) ×1 IMPLANT
SCREW LOCK 40X3.5X HEXALOBE (Screw) ×1 IMPLANT
SCREW LOCKING 2.7X12MM (Screw) ×1 IMPLANT
SCREW LOCKING 2.7X18MM (Screw) ×1 IMPLANT
SCREW LOCKING 2.7X20MM (Screw) ×1 IMPLANT
SCREW LOCKING 3.5X40 (Screw) ×1 IMPLANT
SCREW NON LOCK 3.5X40 (Screw) ×2 IMPLANT
SCREW NON LOCKING HEX 3.5X18MM (Screw) ×6 IMPLANT
SCREW NON LOCKING HEX 3.5X22 (Screw) ×2 IMPLANT
SPLINT PLASTER CAST XFAST 5X30 (CAST SUPPLIES) ×1 IMPLANT
SPLINT PLASTER XFAST SET 5X30 (CAST SUPPLIES) ×1
SPONGE LAP 18X18 RF (DISPOSABLE) IMPLANT
STAPLER VISISTAT 35W (STAPLE) ×2 IMPLANT
STOCKINETTE IMPERVIOUS 9X36 MD (GAUZE/BANDAGES/DRESSINGS) IMPLANT
SUCTION FRAZIER HANDLE 10FR (MISCELLANEOUS) ×1
SUCTION TUBE FRAZIER 10FR DISP (MISCELLANEOUS) ×1 IMPLANT
SUT ETHILON 3 0 PS 1 (SUTURE) ×4 IMPLANT
SUT VIC AB 0 CT1 27 (SUTURE) ×2
SUT VIC AB 0 CT1 27XBRD ANBCTR (SUTURE) ×2 IMPLANT
SUT VIC AB 2-0 CT1 27 (SUTURE) ×2
SUT VIC AB 2-0 CT1 TAPERPNT 27 (SUTURE) ×2 IMPLANT
SYR 5ML LL (SYRINGE) IMPLANT
SYR CONTROL 10ML LL (SYRINGE) IMPLANT
TOWEL GREEN STERILE (TOWEL DISPOSABLE) ×6 IMPLANT
TOWEL GREEN STERILE FF (TOWEL DISPOSABLE) ×2 IMPLANT
TRAY FOLEY MTR SLVR 16FR STAT (SET/KITS/TRAYS/PACK) IMPLANT
WATER STERILE IRR 1000ML POUR (IV SOLUTION) ×2 IMPLANT
YANKAUER SUCT BULB TIP NO VENT (SUCTIONS) IMPLANT

## 2019-08-07 NOTE — Progress Notes (Signed)
Dr. Luane School (anesthesia) aware of BP - 201/74; verbal order for Metoprolol 25mg  PO NOW - given.  Continue to monitor.

## 2019-08-07 NOTE — H&P (Signed)
Orthopaedic Trauma Service H&P/Consult     Patient ID: Pamela Ball MRN: [id number] DOB/AGE: 1946-11-02 73 y.o.  Chief Complaint: left intercondylar humerus fracture HPI: Pamela Ball is an 73 y.o. female.LHD fell almost three weeks ago and was referred from another surgeon given the complexity of injury and the difficulty of repair. She was not interested in total elbow replacement because this is her dominant arm. Denies numbness, tingling, or motor loss. Swelling has improved dramatically.  Past Medical History:  Diagnosis Date  . Abdominal hernia with obstruction and without gangrene   . Anxiety 02/15/2017   denies  . Bipolar 1 disorder, depressed, full remission ([place]) 02/15/2017  . Bipolar affective disorder ([place])   . Depression   . GERD (gastroesophageal reflux disease) 02/15/2017  . Glaucoma   . Headache   . Hiatal hernia   . History of abdominal hernia 05/21/2017  . History of compression fracture of spine 02/15/2017  . Hyperlipidemia   . Incisional hernia   . Incisional ventral hernia w obstruction 06/02/2017  . Normocytic anemia 05/23/2017  . 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 Presbycusis of both ears 02/15/2017    Past Surgical History:  Procedure Laterality Date  . ABDOMINAL HYSTERECTOMY  11/06/2006  . CESAREAN SECTION     x3  . COLONOSCOPY WITH PROPOFOL N/A 06/25/2018   Procedure: COLONOSCOPY WITH PROPOFOL;  Surgeon: Jonathon Bellows, MD;  Location: [facility] ENDOSCOPY;  Service: Endoscopy;  Laterality: N/A;  . KYPHOPLASTY    . KYPHOPLASTY N/A 09/23/2018   Procedure: KYPHOPLASTY T10;  Surgeon: Hessie Knows, MD;  Location: [facility] ORS;  Service: Orthopedics;  Laterality: N/A;  . VENTRAL HERNIA REPAIR N/A 06/02/2017   Procedure: exploratory laparotomy repair of incarcerated  VENTRAL hernia;  Surgeon: Florene Glen, MD;  Location: [facility] ORS;  Service: General;  Laterality: N/A;  . VENTRAL HERNIA REPAIR N/A 08/29/2018   Procedure:  LAPAROSCOPIC INCISIONAL HERNIA;  Surgeon: Olean Ree, MD;  Location: [facility] ORS;  Service: General;  Laterality: N/A;    Family History  Problem Relation Age of Onset  . Cancer Mother        cancer  . Breast cancer Mother 11   Social History:  reports that she has never smoked. She has never used smokeless tobacco. She reports that she does not drink alcohol or use drugs.  Allergies: No Known Allergies  Medications Prior to Admission  Medication Sig Dispense Refill  . buPROPion (WELLBUTRIN XL) 150 MG 24 hr tablet Take 150 mg by mouth daily.     . busPIRone (BUSPAR) 10 MG tablet Take 1 tablet (10 mg total) by mouth daily. Prescribed by Psychiatry Dr Kasandra Knudsen    . cholecalciferol (VITAMIN D) 1000 units tablet Take 1,000 Units by mouth daily.    . metoprolol succinate (TOPROL-XL) 25 MG 24 hr tablet Take 1 tablet (25 mg total) by mouth daily. 30 tablet 1  . Multiple Vitamin (MULTI-VITAMINS) TABS Take 1 tablet by mouth daily.     Marland Kitchen OLANZapine (ZYPREXA) 5 MG tablet Take 5 mg by mouth at bedtime.     Marland Kitchen omeprazole (PRILOSEC) 20 MG capsule TAKE 1 CAPSULE (20 MG TOTAL) BY MOUTH DAILY BEFORE BREAKFAST. 90 capsule 1  . ondansetron (ZOFRAN-ODT) 4 MG disintegrating tablet TAKE 1 TABLET BY MOUTH EVERY 8 HOURS AS NEEDED FOR NAUSEA AND VOMITING (Patient taking differently: Take 4 mg by mouth every 8 (eight) hours as needed for nausea or vomiting. ) 30 tablet 2  . PROAIR HFA 108 (  90 Base) MCG/ACT inhaler INHALE 2 PUFFS INTO THE LUNGS EVERY 4 HOURS AS NEEDED FOR WHEEZING OR SHORTNESS OF BREATH (COUGH) (Patient taking differently: Inhale 2 puffs into the lungs every 4 (four) hours as needed for wheezing or shortness of breath. ) 8.5 g 3  . simvastatin (ZOCOR) 20 MG tablet Take 1 tablet (20 mg total) by mouth daily. 90 tablet 1  . venlafaxine XR (EFFEXOR-XR) 75 MG 24 hr capsule Take 75 mg by mouth daily with breakfast.     . vitamin A 10000 UNIT capsule Take 10,000 Units by mouth daily.    . vitamin C (ASCORBIC  ACID) 500 MG tablet Take 500 mg by mouth daily.      Results for orders placed or performed during the hospital encounter of 08/07/19 (from the past 48 hour(s))  APTT     Status: None   Collection Time: 08/07/19  9:34 AM  Result Value Ref Range   aPTT 30 24 - 36 seconds    Comment: Performed at [facility] [address] [place], [place] [address]  CBC WITH DIFFERENTIAL     Status: Abnormal   Collection Time: 08/07/19  9:34 AM  Result Value Ref Range   WBC 5.7 4.0 - 10.5 K/uL   RBC 4.44 3.87 - 5.11 MIL/uL   Hemoglobin 14.7 12.0 - 15.0 g/dL   HCT 46.8 (H) 36.0 - 46.0 %   MCV 105.4 (H) 80.0 - 100.0 fL   MCH 33.1 26.0 - 34.0 pg   MCHC 31.4 30.0 - 36.0 g/dL   RDW 13.2 11.5 - 15.5 %   Platelets 336 150 - 400 K/uL   nRBC 0.0 0.0 - 0.2 %   Neutrophils Relative % 72 %   Neutro Abs 4.1 1.7 - 7.7 K/uL   Lymphocytes Relative 14 %   Lymphs Abs 0.8 0.7 - 4.0 K/uL   Monocytes Relative 11 %   Monocytes Absolute 0.6 0.1 - 1.0 K/uL   Eosinophils Relative 2 %   Eosinophils Absolute 0.1 0.0 - 0.5 K/uL   Basophils Relative 1 %   Basophils Absolute 0.0 0.0 - 0.1 K/uL   Immature Granulocytes 0 %   Abs Immature Granulocytes 0.01 0.00 - 0.07 K/uL    Comment: Performed at [facility], [address], [place], [place] [address]  Comprehensive metabolic panel     Status: Abnormal   Collection Time: 08/07/19  9:34 AM  Result Value Ref Range   Sodium 142 135 - 145 mmol/L   Potassium 4.0 3.5 - 5.1 mmol/L   Chloride 105 98 - 111 mmol/L   CO2 26 22 - 32 mmol/L   Glucose, Bld 99 70 - 99 mg/dL   BUN 15 8 - 23 mg/dL   Creatinine, Ser 0.73 0.44 - 1.00 mg/dL   Calcium 8.8 (L) 8.9 - 10.3 mg/dL   Total Protein 6.7 6.5 - 8.1 g/dL   Albumin 3.6 3.5 - 5.0 g/dL   AST 22 15 - 41 U/L   ALT 15 0 - 44 U/L   Alkaline Phosphatase 108 38 - 126 U/L   Total Bilirubin 0.6 0.3 - 1.2 mg/dL   GFR calc non Af Amer >60 >60 mL/min   GFR calc Af Amer >60 >60 mL/min   Anion gap 11 5 - 15    Comment:  Performed at [facility] [address], [place], [place] [address]  Magnesium     Status: None   Collection Time: 08/07/19  9:34 AM  Result Value Ref Range   Magnesium 2.1 1.7 - 2.4 mg/dL    Comment: Performed at [facility], [place] [address] [place], [place] [address]  Phosphorus     Status: None   Collection Time: 08/07/19  9:34 AM  Result Value Ref Range   Phosphorus 3.1 2.5 - 4.6 mg/dL    Comment: Performed at [facility] [address], [place], [place] [address]  Prealbumin     Status: None   Collection Time: 08/07/19  9:34 AM  Result Value Ref Range   Prealbumin 26.4 18 - 38 mg/dL    Comment: Performed at [facility] [address], [place], [place] [address]  Protime-INR     Status: None   Collection Time: 08/07/19  9:34 AM  Result Value Ref Range   Prothrombin Time 13.2 11.4 - 15.2 seconds   INR 1.0 0.8 - 1.2    Comment: (NOTE) INR goal varies based on device and disease states. Performed at [facility], [place] [address] [place], [place] [address]   TSH     Status: None   Collection Time: 08/07/19  9:34 AM  Result Value Ref Range   TSH 2.509 0.350 - 4.500 uIU/mL    Comment: Performed by a 3rd Generation assay with a functional sensitivity of <=0.01 uIU/mL. Performed at [facility], [place] [address], [place], [place] [address]   VITAMIN D 25 Hydroxy (Vit-D Deficiency, Fractures)     Status: None   Collection Time: 08/07/19  9:34 AM  Result Value Ref Range   Vit D, 25-Hydroxy 57.46 30 - 100 ng/mL    Comment: (NOTE) Vitamin D deficiency has been defined by the Institute of Medicine  and an Endocrine Society practice guideline as a level of serum 25-OH  vitamin D less than 20 ng/mL (1,2). The Endocrine Society went on to  further define vitamin D insufficiency as a level between 21 and 29  ng/mL (2). 1. IOM (Institute of Medicine). 2010. Dietary reference intakes for  calcium and D. [place]: [employer]. 2. Holick MF, Binkley [place], Bischoff-Ferrari HA, et al. Evaluation,  treatment, and prevention of vitamin D deficiency: an Endocrine  Society clinical practice guideline, JCEM. 2011 Jul; 96(7): 1911-30. Performed at [facility], [place] [address], [place], [place] [address]    No results found.  ROS No recent fever, bleeding abnormalities, urologic dysfunction, GI problems, or weight gain.  Blood pressure (!) 201/74, pulse 93, temperature 98.7 F (37.1 C), temperature source Oral, resp. rate 13, height 4\' 10"  (1.473 m), weight 54.4 kg, SpO2 98 %. Physical Exam Very pleasant A&O, Wisdom with resolving bruise over left eye LUEx   Splint in place  Sens  Ax/R/M/U intact  Mot   Ax/ R/ PIN/ M/ AIN/ U intact  Brisk CR  POST BLOCK today  No sensation and no motor RRR No wheezing  Assessment/Plan Left intra-articular distal humerus fracture Has been taking BC powders regularly including yesterday  Insicional and dressing care: Dressings left intact until follow-up Showering: Keep dressing dry Pain control: Percocet Follow - up plan: 2 weeks  Altamese [place], MD [facility], [facility] [phone]  08/07/2019, 11:22 AM  [facility] [place] [address] [phone] Domingo Sep (F)

## 2019-08-07 NOTE — Anesthesia Postprocedure Evaluation (Signed)
Anesthesia Post Note  Patient: Pamela Ball  Procedure(s) Performed: OPEN REDUCTION INTERNAL FIXATION (ORIF) LEFT DISTAL HUMERUS FRACTURE (Left )     Patient location during evaluation: PACU Anesthesia Type: General Level of consciousness: awake Pain management: pain level controlled Vital Signs Assessment: post-procedure vital signs reviewed and stable Respiratory status: spontaneous breathing Cardiovascular status: stable Postop Assessment: no apparent nausea or vomiting Anesthetic complications: no    Last Vitals:  Vitals:   08/07/19 1105 08/07/19 1633  BP: (!) 201/74 138/65  Pulse: 93 79  Resp:  15  Temp:    SpO2:  100%    Last Pain:  Vitals:   08/07/19 1633  TempSrc:   PainSc: 0-No pain                 Nadene Witherspoon

## 2019-08-07 NOTE — Transfer of Care (Signed)
Immediate Anesthesia Transfer of Care Note  Patient: Pamela Ball  Procedure(s) Performed: OPEN REDUCTION INTERNAL FIXATION (ORIF) LEFT DISTAL HUMERUS FRACTURE (Left )  Patient Location: PACU  Anesthesia Type:GA combined with regional for post-op pain  Level of Consciousness: awake, alert , oriented and patient cooperative  Airway & Oxygen Therapy: Patient Spontanous Breathing and Patient connected to face mask oxygen  Post-op Assessment: Report given to RN and Post -op Vital signs reviewed and stable  Post vital signs: Reviewed and stable  Last Vitals:  Vitals Value Taken Time  BP 138/65 08/07/19 1633  Temp    Pulse 76 08/07/19 1636  Resp 16 08/07/19 1636  SpO2 100 % 08/07/19 1636  Vitals shown include unvalidated device data.  Last Pain:  Vitals:   08/07/19 1045  TempSrc:   PainSc: 6       Patients Stated Pain Goal: 2 ([id number])  Complications: No apparent anesthesia complications

## 2019-08-07 NOTE — Anesthesia Procedure Notes (Signed)
Procedure Name: Intubation Date/Time: 08/07/2019 12:28 PM Performed by: Adalberto Ill, CRNA Pre-anesthesia Checklist: Patient identified, Emergency Drugs available, Suction available, Patient being monitored and Timeout performed Patient Re-evaluated:Patient Re-evaluated prior to induction Oxygen Delivery Method: Circle system utilized Preoxygenation: Pre-oxygenation with 100% oxygen Induction Type: IV induction Ventilation: Mask ventilation without difficulty Laryngoscope Size: Miller and 2 Grade View: Grade I Tube type: Oral Tube size: 6.5 mm Number of attempts: 1 Airway Equipment and Method: Stylet Placement Confirmation: ETT inserted through vocal cords under direct vision,  positive ETCO2 and breath sounds checked- equal and bilateral Secured at: 21 cm Tube secured with: Tape Dental Injury: Teeth and Oropharynx as per pre-operative assessment

## 2019-08-07 NOTE — Discharge Instructions (Addendum)
[facility] Discharge Instructions   General Discharge Instructions  WEIGHT BEARING STATUS: Nonweightbearing left upper extremity  RANGE OF MOTION/ACTIVITY: Okay to move fingers on left hand.  You are unable to move your wrist and elbow as you are in a splint.  Wear sling at all times.  Bone health: Continue with vitamin D supplementation.  We will discuss your labs at follow-up.  We also discussed getting a central bone density scan to further evaluate your bone health.  Wound Care: Do not remove splint.  We will remove at your first postoperative visit.  Keep splint clean and dry.  Do not put anything down the splint.    Diet: as you were eating previously.  Can use over the counter stool softeners and bowel preparations, such as Miralax, to help with bowel movements.  Narcotics can be constipating.  Be sure to drink plenty of fluids  PAIN MEDICATION USE AND EXPECTATIONS  You have likely been given narcotic medications to help control your pain.  After a traumatic event that results in an fracture (broken bone) with or without surgery, it is ok to use narcotic pain medications to help control one's pain.  We understand that everyone responds to pain differently and each individual patient will be evaluated on a regular basis for the continued need for narcotic medications. Ideally, narcotic medication use should last no more than 6-8 weeks (coinciding with fracture healing).   As a patient it is your responsibility as well to monitor narcotic medication use and report the amount and frequency you use these medications when you come to your office visit.   We would also advise that if you are using narcotic medications, you should take a dose prior to therapy to maximize you participation.  IF YOU ARE ON NARCOTIC MEDICATIONS IT IS NOT PERMISSIBLE TO OPERATE A MOTOR VEHICLE (MOTORCYCLE/CAR/TRUCK/MOPED) OR HEAVY MACHINERY DO NOT MIX NARCOTICS WITH OTHER CNS (CENTRAL NERVOUS  SYSTEM) DEPRESSANTS SUCH AS ALCOHOL   STOP SMOKING OR USING NICOTINE PRODUCTS!!!!  As discussed nicotine severely impairs your body's ability to heal surgical and traumatic wounds but also impairs bone healing.  Wounds and bone heal by forming microscopic blood vessels (angiogenesis) and nicotine is a vasoconstrictor (essentially, shrinks blood vessels).  Therefore, if vasoconstriction occurs to these microscopic blood vessels they essentially disappear and are unable to deliver necessary nutrients to the healing tissue.  This is one modifiable factor that you can do to dramatically increase your chances of healing your injury.    (This means no smoking, no nicotine gum, patches, etc)  DO NOT USE NONSTEROIDAL ANTI-INFLAMMATORY DRUGS (NSAID'S)  Using products such as Advil (ibuprofen), Aleve (naproxen), Motrin (ibuprofen) for additional pain control during fracture healing can delay and/or prevent the healing response.  If you would like to take over the counter (OTC) medication, Tylenol (acetaminophen) is ok.  However, some narcotic medications that are given for pain control contain acetaminophen as well. Therefore, you should not exceed more than 4000 mg of tylenol in a day if you do not have liver disease.  Also note that there are may OTC medicines, such as cold medicines and allergy medicines that my contain tylenol as well.  If you have any questions about medications and/or interactions please ask your doctor/PA or your pharmacist.      ICE AND ELEVATE INJURED/OPERATIVE EXTREMITY  Using ice and elevating the injured extremity above your heart can help with swelling and pain control.  Icing in a pulsatile fashion, such  as 20 minutes on and 20 minutes off, can be followed.    Do not place ice directly on skin. Make sure there is a barrier between to skin and the ice pack.    Using frozen items such as frozen peas works well as the conform nicely to the are that needs to be iced.  USE AN ACE WRAP  OR TED HOSE FOR SWELLING CONTROL  In addition to icing and elevation, Ace wraps or TED hose are used to help limit and resolve swelling.  It is recommended to use Ace wraps or TED hose until you are informed to stop.    When using Ace Wraps start the wrapping distally (farthest away from the body) and wrap proximally (closer to the body)   Example: If you had surgery on your leg or thing and you do not have a splint on, start the ace wrap at the toes and work your way up to the thigh        If you had surgery on your upper extremity and do not have a splint on, start the ace wrap at your fingers and work your way up to the upper arm  IF YOU ARE IN A SPLINT OR CAST DO NOT Rembrandt   If your splint gets wet for any reason please contact the office immediately. You may shower in your splint or cast as long as you keep it dry.  This can be done by wrapping in a cast cover or garbage back (or similar)  Do Not stick any thing down your splint or cast such as pencils, money, or hangers to try and scratch yourself with.  If you feel itchy take benadryl as prescribed on the bottle for itching  IF YOU ARE IN A CAM BOOT (BLACK BOOT)  You may remove boot periodically. Perform daily dressing changes as noted below.  Wash the liner of the boot regularly and wear a sock when wearing the boot. It is recommended that you sleep in the boot until told otherwise    Call office for the following:  Temperature greater than 101F  Persistent nausea and vomiting  Severe uncontrolled pain  Redness, tenderness, or signs of infection (pain, swelling, redness, odor or green/yellow discharge around the site)  Difficulty breathing, headache or visual disturbances  Hives  Persistent dizziness or light-headedness  Extreme fatigue  Any other questions or concerns you may have after discharge  In an emergency, call 911 or go to an Emergency Department at a nearby hospital    [place]: [phone]   VISIT OUR WEBSITE FOR ADDITIONAL INFORMATION: [email or web address]

## 2019-08-07 NOTE — Anesthesia Procedure Notes (Signed)
Anesthesia Regional Block: Supraclavicular block   Pre-Anesthetic Checklist: ,, timeout performed, Correct Patient, Correct Site, Correct Laterality, Correct Procedure, Correct Position, site marked, Risks and benefits discussed,  Surgical consent,  Pre-op evaluation,  At surgeon's request and post-op pain management  Laterality: Left  Prep: chloraprep       Needles:  Injection technique: Single-shot  Needle Type: Echogenic Stimulator Needle     Needle Length: 5cm  Needle Gauge: 22     Additional Needles:   Procedures:, nerve stimulator,,, ultrasound used (permanent image in chart),,,,   Nerve Stimulator or Paresthesia:  Response: hand, 0.45 mA,   Additional Responses:   Narrative:  Start time: 08/07/2019 10:56 AM End time: 08/07/2019 11:00 AM Injection made incrementally with aspirations every 5 mL.  Performed by: Personally  Anesthesiologist: Janeece Riggers, MD  Additional Notes: Functioning IV was confirmed and monitors were applied.  A 58mm 22ga Arrow echogenic stimulator needle was used. Sterile prep and drape,hand hygiene and sterile gloves were used. Ultrasound guidance: relevant anatomy identified, needle position confirmed, local anesthetic spread visualized around nerve(s)., vascular puncture avoided.  Image printed for medical record. Negative aspiration and negative test dose prior to incremental administration of local anesthetic. The patient tolerated the procedure well.

## 2019-08-08 LAB — CALCITRIOL (1,25 DI-OH VIT D): Vit D, 1,25-Dihydroxy: 71.4 pg/mL (ref 19.9–79.3)

## 2019-08-09 NOTE — Op Note (Signed)
#  [id number] 

## 2019-08-10 NOTE — Op Note (Addendum)
NAME: Pamela Ball, Pamela [facility] MEDICAL RECORD [id number] ACCOUNT [id number] DATE OF BIRTH:1945/11/09 FACILITY: [facility] LOCATION: [facility] PHYSICIAN:Laquanda Bick H. Mattie Nordell, MD  OPERATIVE REPORT  DATE OF PROCEDURE:  08/07/2019  PREOPERATIVE DIAGNOSES:  Left transcondylar distal humerus fracture with severe comminution.  POSTOPERATIVE DIAGNOSES:  Left transcondylar distal humerus fracture with severe comminution.  PROCEDURE: 1.  Open reduction internal fixation of left transcondylar distal humerus fracture with intercondylar comminution. 2.  Ulnar nerve neuroplasty. 3.  Left ulnar osteotomy.   SURGEON:  Altamese Ewing, MD  ASSISTANT:  None.  ANESTHESIA:  General, supplemented with regional block.  COMPLICATIONS:  None.  TOURNIQUET:  None.  DISPOSITION:  To PACU.  CONDITION:  Stable.  BRIEF SUMMARY OF INDICATION FOR PROCEDURE:  The patient is a left-hand dominant white female who fell nearly 3 weeks ago and was initially seen at an outside facility.  I did discuss with her the options for treatment of this including nonsurgical, total  elbow replacement, and surgical repair.  She was unwilling to consider total elbow replacement, which would have been ideal in the scenario of her osteoporosis and comminution, but because of that understandably wished to proceed with repair,  understanding the potential for malunion, nonunion, loss of reduction, and the need for multiple procedures including subsequent capsulectomy and removal of symptomatic hardware, the certainty of loss of motion, DVT, PE, heart attack, stroke, and other  concerns.  She did provide consent to proceed.  BRIEF SUMMARY OF PROCEDURE:  The patient was given a regional block preoperatively, taken to the operating room where general anesthesia was induced.  She was positioned left side up with all prominences padded appropriately.  A tourniquet was placed and  bone foam.  A chlorhexidine wash, Betadine scrub, and paint were  performed and then standard prep and drape.  Timeout was held.  We began with a posterior incision and approach to the triceps.  Normally, I may have attempted for a distal humerus no bone  cut.  In this case, I chose to perform an ulnar osteotomy using the Chevron technique.  First, prepared the bone for later repair by placing the Acumed plate on the olecranon and securing pin contact of the tine with the ulna, placing the so-called home  run pin to the tip of the olecranon process, applying distally directed pressure to the plate and placing 2 K wires through the K-wire holes into the proximal segment and then drilling and placing a single screw at the distal edge without tightening it  but just tapping it without engaging the head.  Once this was done, the 2 K wires were left in place along the olecranon and the plate removed.  Dissection continued proximally where I went medially in order to release the ulnar nerve from the  encumbrances of the medial comminuted segment of the distal humerus and associated fibrosis and scarring or callus formation.  There were multiple small fragments there.  I placed a Penrose drain gently around the ulnar nerve, and with the help of my  assistant, mobilized it in various directions while carefully releasing the adjacent tissue until it was completely free, past the cubital tunnel into the forearm, and up to the intermuscular septum proximally.  Once this was complete, I then went along  the lateral aspect and mobilized the triceps fascia, heading distally down to the ulna and sliding the corner of a lap sponge underneath the articular surface in the bare spot of the olecranon.  Using a microsagittal saw, I then made a  Chevron cut and  completed it with the osteotome.  The bone was noted to be fragile.  After reflection of the triceps tendon, I was able to see the severely comminuted fracture into the intercondylar notch and trochlear spool.  I was able to clean the  fracture edges with  a curette and lavage and tease them together with a dental pick.  This was followed with a small K-wire fixation using a 0.045 K-wire.  After this was complete, I then took additional K wires and secured the articular surface to the lateral column and  an additional one in the medial column, but rather than performing individual reductions of the severely comminuted medial segments that really did not have integrity, I kept this callus segment together and pinned this to the column.  C-arm was brought  in.  It did show substantial comminution as previously noted, but sufficient to allow for plate osteosynthesis.  The correct position was gauged to the lateral plate securing fixation into the capitellum and up the lateral column.  This was followed by a  medial plate and 1 locked screw across the trochlear spool, checking its position on orthogonal views with fluoro.  Lastly, fixation was achieved proximally in the medial plate as well.  I then reduced the osteotomy and placed the Acumed plate, securing  fixation distally, and then the home run screw using a lag technique, which was followed by additional standard small screws proximally and a 3.5 screw distally.  There were no complications during the procedure.  The patient underwent a thorough  irrigation and a layered closure using #1 Vicryl for the triceps and then 0 Vicryl, 2-0 Vicryl, and nylon.  Prior to beginning the closure, I did use an inverted 2-0 Prolene stitch to bring together the deep fascia over the plate, allowing the ulnar  nerve to remain untethered within the cubital tunnel.  An assistant was required for this case given its considerable technical difficulty, and this was helpful during provisional and definitive fixation, retraction, protection of the ulnar nerve, and  closure.  A gently compressive dressing was applied from hand to upper arm and then a splint.  The patient was taken to the PACU in stable condition  after application of a sling.  PROGNOSIS:  The patient has a rather severely comminuted distal humerus fracture with underlying osteoporosis.  Consequently, we need to protect her repair for the next several weeks and proceed at a very slow pace with regard to the rehabilitation.   This would be anticipated to result in some loss of motion, but if we can obtain healing, we will ultimately not have any restrictions with regard to weight.  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  LN/NUANCE  D:08/09/2019 T:08/10/2019 JOB:[id number]

## 2019-08-11 ENCOUNTER — Encounter (HOSPITAL_COMMUNITY): Payer: Self-pay | Admitting: Orthopedic Surgery

## 2019-08-25 ENCOUNTER — Other Ambulatory Visit: Payer: Self-pay | Admitting: Family Medicine

## 2019-08-25 DIAGNOSIS — R110 Nausea: Secondary | ICD-10-CM

## 2019-08-27 DIAGNOSIS — S42472D Displaced transcondylar fracture of left humerus, subsequent encounter for fracture with routine healing: Secondary | ICD-10-CM | POA: Diagnosis not present

## 2019-08-27 DIAGNOSIS — S42402D Unspecified fracture of lower end of left humerus, subsequent encounter for fracture with routine healing: Secondary | ICD-10-CM | POA: Diagnosis not present

## 2019-09-22 DIAGNOSIS — H3589 Other specified retinal disorders: Secondary | ICD-10-CM | POA: Diagnosis not present

## 2019-09-25 ENCOUNTER — Other Ambulatory Visit: Payer: Self-pay | Admitting: Family Medicine

## 2019-09-25 DIAGNOSIS — K219 Gastro-esophageal reflux disease without esophagitis: Secondary | ICD-10-CM

## 2019-09-29 DIAGNOSIS — S42472D Displaced transcondylar fracture of left humerus, subsequent encounter for fracture with routine healing: Secondary | ICD-10-CM | POA: Diagnosis not present

## 2019-09-29 DIAGNOSIS — S42402D Unspecified fracture of lower end of left humerus, subsequent encounter for fracture with routine healing: Secondary | ICD-10-CM | POA: Diagnosis not present

## 2019-10-17 ENCOUNTER — Other Ambulatory Visit: Payer: Self-pay | Admitting: Family Medicine

## 2019-10-17 DIAGNOSIS — R110 Nausea: Secondary | ICD-10-CM

## 2019-10-17 NOTE — Telephone Encounter (Signed)
Has not seen Dr. Raliegh Ip since 7/19. Please schedule follow up and I will send her in enough medicine to get to that appointment.

## 2019-10-17 NOTE — Telephone Encounter (Signed)
The pt schedule an f/u appt for next Wednesday, Dec. 15th.

## 2019-10-22 ENCOUNTER — Ambulatory Visit (INDEPENDENT_AMBULATORY_CARE_PROVIDER_SITE_OTHER): Payer: Medicare Other | Admitting: Family Medicine

## 2019-10-22 ENCOUNTER — Other Ambulatory Visit: Payer: Self-pay

## 2019-10-22 DIAGNOSIS — J432 Centrilobular emphysema: Secondary | ICD-10-CM | POA: Diagnosis not present

## 2019-10-22 DIAGNOSIS — F3176 Bipolar disorder, in full remission, most recent episode depressed: Secondary | ICD-10-CM | POA: Diagnosis not present

## 2019-10-22 NOTE — Progress Notes (Signed)
Virtual Visit via Telephone The purpose of this virtual visit is to provide medical care while limiting exposure to the novel coronavirus (COVID19) for both patient and office staff.  Consent was obtained for phone visit:  Yes.   Answered questions that patient had about telehealth interaction:  Yes.   I discussed the limitations, risks, security and privacy concerns of performing an evaluation and management service by telephone. I also discussed with the patient that there may be a patient responsible charge related to this service. The patient expressed understanding and agreed to proceed.  Patient Location: Home Provider Location: [facility])  ---------------------------------------------------------------------- Chief Complaint  Patient presents with  . Manic Behavior    S: Reviewed CMA documentation. I have called patient and gathered additional HPI as follows:  Bipolar Depression, Controlled in Remission / Anxiety Followed by Psychiatry CBC Dr Kasandra Knudsen, see prior notes for background information. - Today patient reportsno new concerns, doing well without med changes or problems - Currently taking Olanzapine(Zyprexa)5mg  daily, Venlafaxine ER 75mg  daily, Buproprion XL 150mg  daily, Buspirone 10mg 1-2 times daily. She is tolerating meds well and currently stable on this regimen, without recent change. - She has all refills, does not need any at this time, they are all prescribed by Dr Kasandra Knudsen  Centrilobular Emphysema - Chronic problem. She is a non smoker. Using ProAir PRN  Denies any high risk travel to areas of current concern for COVID19. Denies any known or suspected exposure to person with or possibly with COVID19.  Denies any fevers, chills, sweats, body ache, cough, shortness of breath, sinus pain or pressure, headache, abdominal pain, diarrhea   Health Maintenance Due for 2nd pneumonia vaccine >1 year after previous vaccine, now to receive Pneumovax-23  today  Due for Flu Shot, she will come in    Past Medical History:  Diagnosis Date  . Abdominal hernia with obstruction and without gangrene   . Anxiety 02/15/2017   denies  . Bipolar 1 disorder, depressed, full remission ([place]) 02/15/2017  . Bipolar affective disorder ([place])   . Depression   . GERD (gastroesophageal reflux disease) 02/15/2017  . Glaucoma   . Headache   . Hiatal hernia   . History of abdominal hernia 05/21/2017  . History of compression fracture of spine 02/15/2017  . Hyperlipidemia   . Incisional hernia   . Incisional ventral hernia w obstruction 06/02/2017  . Normocytic anemia 05/23/2017  . 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 Presbycusis of both ears 02/15/2017   Social History   Tobacco Use  . Smoking status: Never Smoker  . Smokeless tobacco: Never Used  Substance Use Topics  . Alcohol use: No  . Drug use: No    Current Outpatient Medications:  .  buPROPion (WELLBUTRIN XL) 150 MG 24 hr tablet, Take 150 mg by mouth daily. , Disp: , Rfl:  .  busPIRone (BUSPAR) 10 MG tablet, Take 1 tablet (10 mg total) by mouth daily. Prescribed by Psychiatry Dr Kasandra Knudsen, Disp: , Rfl:  .  cholecalciferol (VITAMIN D) 1000 units tablet, Take 1,000 Units by mouth daily., Disp: , Rfl:  .  Multiple Vitamin (MULTI-VITAMINS) TABS, Take 1 tablet by mouth daily. , Disp: , Rfl:  .  OLANZapine (ZYPREXA) 5 MG tablet, Take 5 mg by mouth at bedtime. , Disp: , Rfl:  .  omeprazole (PRILOSEC) 20 MG capsule, TAKE 1 CAPSULE (20 MG TOTAL) BY MOUTH DAILY BEFORE BREAKFAST., Disp: 90 capsule, Rfl: 1 .  ondansetron (ZOFRAN-ODT) 4 MG disintegrating tablet, TAKE  1 TABLET BY MOUTH EVERY 8 HOURS AS NEEDED FOR NAUSEA AND VOMITING, Disp: 30 tablet, Rfl: 0 .  oxyCODONE-acetaminophen (PERCOCET) 5-325 MG tablet, Take 1 tablet by mouth every 6 (six) hours as needed for severe pain., Disp: 56 tablet, Rfl: 0 .  PROAIR HFA 108 (90 Base) MCG/ACT inhaler, INHALE 2 PUFFS INTO THE LUNGS EVERY 4 HOURS AS NEEDED FOR  WHEEZING OR SHORTNESS OF BREATH (COUGH) (Patient taking differently: Inhale 2 puffs into the lungs every 4 (four) hours as needed for wheezing or shortness of breath. ), Disp: 8.5 g, Rfl: 3 .  simvastatin (ZOCOR) 20 MG tablet, Take 1 tablet (20 mg total) by mouth daily., Disp: 90 tablet, Rfl: 1 .  venlafaxine XR (EFFEXOR-XR) 75 MG 24 hr capsule, Take 75 mg by mouth daily with breakfast. , Disp: , Rfl:  .  vitamin A 10000 UNIT capsule, Take 10,000 Units by mouth daily., Disp: , Rfl:  .  vitamin C (ASCORBIC ACID) 500 MG tablet, Take 500 mg by mouth daily., Disp: , Rfl:  .  metoprolol succinate (TOPROL-XL) 25 MG 24 hr tablet, Take 1 tablet (25 mg total) by mouth daily., Disp: 30 tablet, Rfl: 1  Depression screen [facility] 2/9 10/22/2019 07/01/2019 01/02/2019  Decreased Interest 3 0 3  Down, Depressed, Hopeless 3 0 3  PHQ - 2 Score 6 0 6  Altered sleeping 3 - 0  Tired, decreased energy 2 - 3  Change in appetite 2 - 3  Feeling bad or failure about yourself  0 - 3  Trouble concentrating 0 - 0  Moving slowly or fidgety/restless 1 - 3  Suicidal thoughts 0 - 0  PHQ-9 Score 14 - 18  Difficult doing work/chores Not difficult at all - Somewhat difficult    GAD 7 : Generalized Anxiety Score 10/22/2019  Nervous, Anxious, on Edge 3  Control/stop worrying 1  Worry too much - different things 2  Trouble relaxing 2  Restless 3  Easily annoyed or irritable 1  Afraid - awful might happen 1  Total GAD 7 Score 13  Anxiety Difficulty Not difficult at all    -------------------------------------------------------------------------- O: No physical exam performed due to remote telephone encounter.  Lab results reviewed.  Recent Results (from the past 2160 hour(s))  SARS CORONAVIRUS 2 (TAT 6-24 HRS) Nasopharyngeal Nasopharyngeal Swab     Status: None   Collection Time: 08/05/19 12:40 PM   Specimen: Nasopharyngeal Swab  Result Value Ref Range   SARS Coronavirus 2 NEGATIVE NEGATIVE    Comment:  (NOTE) SARS-CoV-2 target nucleic acids are NOT DETECTED. The SARS-CoV-2 RNA is generally detectable in upper and lower respiratory specimens during the acute phase of infection. Negative results do not preclude SARS-CoV-2 infection, do not rule out co-infections with other pathogens, and should not be used as the sole basis for treatment or other patient management decisions. Negative results must be combined with clinical observations, patient history, and epidemiological information. The expected result is Negative. Fact Sheet for Patients: SugarRoll.be Fact Sheet for Healthcare Providers: https://www.woods-mathews.com/ This test is not yet approved or cleared by the [place] FDA and  has been authorized for detection and/or diagnosis of SARS-CoV-2 by FDA under an Emergency Use Authorization (EUA). This EUA will remain  in effect (meaning this test can be used) for the duration of the COVID-19 declaration under Section 56 4(b)(1) of the Act, 21 U.S.C. section 360bbb-3(b)(1), unless the authorization is terminated or revoked sooner. Performed at [facility], [place],  [place] [address]   APTT     Status: None   Collection Time: 08/07/19  9:34 AM  Result Value Ref Range   aPTT 30 24 - 36 seconds    Comment: Performed at [facility] [address] [place], [place] [address]  Calcitriol (1,25 di-OH Vit D)     Status: None   Collection Time: 08/07/19  9:34 AM  Result Value Ref Range   Vit D, 1,25-Dihydroxy 71.4 19.9 - 79.3 pg/mL    Comment: (NOTE) Performed At: [facility] [place], [place] [id number] Rush Farmer MD [id number]   CBC WITH DIFFERENTIAL     Status: Abnormal   Collection Time: 08/07/19  9:34 AM  Result Value Ref Range   WBC 5.7 4.0 - 10.5 K/uL   RBC 4.44 3.87 - 5.11 MIL/uL   Hemoglobin 14.7 12.0 - 15.0 g/dL   HCT 46.8 (H) 36.0 - 46.0 %   MCV 105.4 (H) 80.0 -  100.0 fL   MCH 33.1 26.0 - 34.0 pg   MCHC 31.4 30.0 - 36.0 g/dL   RDW 13.2 11.5 - 15.5 %   Platelets 336 150 - 400 K/uL   nRBC 0.0 0.0 - 0.2 %   Neutrophils Relative % 72 %   Neutro Abs 4.1 1.7 - 7.7 K/uL   Lymphocytes Relative 14 %   Lymphs Abs 0.8 0.7 - 4.0 K/uL   Monocytes Relative 11 %   Monocytes Absolute 0.6 0.1 - 1.0 K/uL   Eosinophils Relative 2 %   Eosinophils Absolute 0.1 0.0 - 0.5 K/uL   Basophils Relative 1 %   Basophils Absolute 0.0 0.0 - 0.1 K/uL   Immature Granulocytes 0 %   Abs Immature Granulocytes 0.01 0.00 - 0.07 K/uL    Comment: Performed at [facility], [address], [place], [place] [address]  Comprehensive metabolic panel     Status: Abnormal   Collection Time: 08/07/19  9:34 AM  Result Value Ref Range   Sodium 142 135 - 145 mmol/L   Potassium 4.0 3.5 - 5.1 mmol/L   Chloride 105 98 - 111 mmol/L   CO2 26 22 - 32 mmol/L   Glucose, Bld 99 70 - 99 mg/dL   BUN 15 8 - 23 mg/dL   Creatinine, Ser 0.73 0.44 - 1.00 mg/dL   Calcium 8.8 (L) 8.9 - 10.3 mg/dL   Total Protein 6.7 6.5 - 8.1 g/dL   Albumin 3.6 3.5 - 5.0 g/dL   AST 22 15 - 41 U/L   ALT 15 0 - 44 U/L   Alkaline Phosphatase 108 38 - 126 U/L   Total Bilirubin 0.6 0.3 - 1.2 mg/dL   GFR calc non Af Amer >60 >60 mL/min   GFR calc Af Amer >60 >60 mL/min   Anion gap 11 5 - 15    Comment: Performed at [facility] [address], [place], [place] [address]  Magnesium     Status: None   Collection Time: 08/07/19  9:34 AM  Result Value Ref Range   Magnesium 2.1 1.7 - 2.4 mg/dL    Comment: Performed at [facility] [address] [place], [place] [address]  Phosphorus     Status: None   Collection Time: 08/07/19  9:34 AM  Result Value Ref Range   Phosphorus 3.1 2.5 - 4.6 mg/dL    Comment: Performed at [facility] [address] [place], [place] [address]  Prealbumin  Status: None   Collection Time: 08/07/19  9:34 AM  Result Value Ref Range   Prealbumin 26.4 18 - 38  mg/dL    Comment: Performed at [facility] [address] [place], [place] [address]  Protime-INR     Status: None   Collection Time: 08/07/19  9:34 AM  Result Value Ref Range   Prothrombin Time 13.2 11.4 - 15.2 seconds   INR 1.0 0.8 - 1.2    Comment: (NOTE) INR goal varies based on device and disease states. Performed at [facility], [place] [address] [place], [place] [address]   TSH     Status: None   Collection Time: 08/07/19  9:34 AM  Result Value Ref Range   TSH 2.509 0.350 - 4.500 uIU/mL    Comment: Performed by a 3rd Generation assay with a functional sensitivity of <=0.01 uIU/mL. Performed at [facility], [place] [address], [place], [place] [address]   VITAMIN D 25 Hydroxy (Vit-D Deficiency, Fractures)     Status: None   Collection Time: 08/07/19  9:34 AM  Result Value Ref Range   Vit D, 25-Hydroxy 57.46 30 - 100 ng/mL    Comment: (NOTE) Vitamin D deficiency has been defined by the Institute of Medicine  and an Endocrine Society practice guideline as a level of serum 25-OH  vitamin D less than 20 ng/mL (1,2). The Endocrine Society went on to  further define vitamin D insufficiency as a level between 21 and 29  ng/mL (2). 1. IOM (Institute of Medicine). 2010. Dietary reference intakes for  calcium and D. [place]: The [employer]. 2. Holick MF, Binkley Armstrong, Bischoff-Ferrari HA, et al. Evaluation,  treatment, and prevention of vitamin D deficiency: an Endocrine  Society clinical practice guideline, JCEM. 2011 Jul; 96(7): 1911-30. Performed at [facility], [place] [address], [place], [place] [address]   Urinalysis, Routine w reflex microscopic     Status: Abnormal   Collection Time: 08/07/19 10:40 AM  Result Value Ref Range   Color, Urine YELLOW YELLOW   APPearance CLEAR CLEAR   Specific Gravity, Urine 1.024 1.005 - 1.030   pH 5.0 5.0 - 8.0   Glucose, UA NEGATIVE NEGATIVE mg/dL   Hgb urine dipstick NEGATIVE NEGATIVE   Bilirubin  Urine NEGATIVE NEGATIVE   Ketones, ur 5 (A) NEGATIVE mg/dL   Protein, ur NEGATIVE NEGATIVE mg/dL   Nitrite NEGATIVE NEGATIVE   Leukocytes,Ua NEGATIVE NEGATIVE    Comment: Performed at [facility] [address], [place], [place] [address]    -------------------------------------------------------------------------- A&P:  Problem List Items Addressed This Visit    Centrilobular emphysema ([place]) - Primary    Stable chronic COPD without flare Second hand smoke, never smoker Not followed by Pulm. No prior PFTs/spirometry - Off Advair maintenance due to cost  Plan: 1. Offered alternative maintenance inhaler but she declines at this time, will defer due to cost and she has demonstrated may not require it long term use - stable without advair 2. Encouraged keep up regular walking regimen to improve lung function 3. May use albuterol but only PRN - notify office if using more frequently or flare 4. Follow-up as needed      Bipolar 1 disorder, depressed, full remission ([place])    Stable, controlled on current regimen, chronic problem multiple psych dx, primarily bipolar depression with mixed anxiety. Followed by Psychiatry now, Dr Kasandra Knudsen [facility]), currently receiving rx and managing  Plan: 1. Follow-up  as scheduled with Psychiatry - advised her to re-schedule as soon as can for routine visit - she will need refills on her Psychiatric medications - Olanzapine 5mg  daily, Venlafaxine ER 75mg  daily, Buproprion XL 150mg  daily, Buspirone 10mg  1-2 x daily - Follow-up as needed          No orders of the defined types were placed in this encounter.   Follow-up: - Return in 6 months for Annual Physical  Patient verbalizes understanding with the above medical recommendations including the limitation of remote medical advice.  Specific follow-up and call-back criteria were given for patient to follow-up or seek medical care more urgently if needed.   - Time spent in  direct consultation with patient on phone: 7 minutes   [name], [facility] 10/22/2019, 2:09 PM

## 2019-10-22 NOTE — Patient Instructions (Addendum)
Please schedule and return for a NURSE ONLY VISIT for VACCINE - Need High Dose Flu Vaccine - Need Pneumovax-23  DUE for FASTING BLOOD WORK (no food or drink after midnight before the lab appointment, only water or coffee without cream/sugar on the morning of)  SCHEDULE "Lab Only" visit in the morning at the clinic for lab draw in 6 MONTHS    - Make sure Lab Only appointment is at about 1 week before your next appointment, so that results will be available  For Lab Results, once available within 2-3 days of blood draw, you can can log in to MyChart online to view your results and a brief explanation. Also, we can discuss results at next follow-up visit.   Please schedule a Follow-up Appointment to: Return in about 6 months (around 04/21/2020) for Annual Physical.  If you have any other questions or concerns, please feel free to call the office or send a message through [place]. You may also schedule an earlier appointment if necessary.  Additionally, you may be receiving a survey about your experience at our office within a few days to 1 week by e-mail or mail. We value your feedback.  [name], DO [place]

## 2019-10-23 NOTE — Assessment & Plan Note (Signed)
Stable chronic COPD without flare Second hand smoke, never smoker Not followed by Pulm. No prior PFTs/spirometry - Off Advair maintenance due to cost  Plan: 1. Offered alternative maintenance inhaler but she declines at this time, will defer due to cost and she has demonstrated may not require it long term use - stable without advair 2. Encouraged keep up regular walking regimen to improve lung function 3. May use albuterol but only PRN - notify office if using more frequently or flare 4. Follow-up as needed

## 2019-10-23 NOTE — Assessment & Plan Note (Signed)
Stable, controlled on current regimen, chronic problem multiple psych dx, primarily bipolar depression with mixed anxiety. Followed by Psychiatry now, Dr Kasandra Knudsen [facility]), currently receiving rx and managing  Plan: 1. Follow-up as scheduled with Psychiatry - advised her to re-schedule as soon as can for routine visit - she will need refills on her Psychiatric medications - Olanzapine 5mg  daily, Venlafaxine ER 75mg  daily, Buproprion XL 150mg  daily, Buspirone 10mg  1-2 x daily - Follow-up as needed

## 2019-11-01 ENCOUNTER — Other Ambulatory Visit: Payer: Self-pay | Admitting: Family Medicine

## 2019-11-01 DIAGNOSIS — R110 Nausea: Secondary | ICD-10-CM

## 2019-11-01 NOTE — Telephone Encounter (Signed)
Forwarding medication refill request to PCP for review. 

## 2019-11-03 NOTE — Telephone Encounter (Signed)
Your patient 

## 2019-11-19 DIAGNOSIS — S42472D Displaced transcondylar fracture of left humerus, subsequent encounter for fracture with routine healing: Secondary | ICD-10-CM | POA: Diagnosis not present

## 2019-11-19 DIAGNOSIS — S42402D Unspecified fracture of lower end of left humerus, subsequent encounter for fracture with routine healing: Secondary | ICD-10-CM | POA: Diagnosis not present

## 2020-01-01 ENCOUNTER — Telehealth: Payer: Self-pay | Admitting: Family Medicine

## 2020-01-01 ENCOUNTER — Other Ambulatory Visit: Payer: Self-pay | Admitting: Family Medicine

## 2020-01-01 DIAGNOSIS — R110 Nausea: Secondary | ICD-10-CM

## 2020-01-01 NOTE — Telephone Encounter (Signed)
Pt called requesting refill on  ondansetron

## 2020-01-01 NOTE — Telephone Encounter (Signed)
Refilled  [name], DO [facility] 01/01/2020, 2:37 PM

## 2020-02-27 ENCOUNTER — Other Ambulatory Visit: Payer: Self-pay | Admitting: Family Medicine

## 2020-02-27 DIAGNOSIS — K219 Gastro-esophageal reflux disease without esophagitis: Secondary | ICD-10-CM

## 2020-02-27 NOTE — Telephone Encounter (Signed)
Requested Prescriptions  Pending Prescriptions Disp Refills  . omeprazole (PRILOSEC) 20 MG capsule [Pharmacy Med Name: OMEPRAZOLE DR 20 MG CAPSULE] 90 capsule 1    Sig: TAKE 1 CAPSULE (20 MG TOTAL) BY MOUTH DAILY BEFORE BREAKFAST.     Gastroenterology: Proton Pump Inhibitors Passed - 02/27/2020 10:55 AM      Passed - Valid encounter within last 12 months    Recent Outpatient Visits          4 months ago Centrilobular emphysema ([place])   [facility] Olin Hauser, DO   1 year ago Influenza A   [place], DO   1 year ago Pyelonephritis   [facility] Olin Hauser, DO   1 year ago Annual physical exam   [facility] Olin Hauser, DO   2 years ago Centrilobular emphysema [facility])   [place], Devonne Doughty, DO

## 2020-03-01 ENCOUNTER — Other Ambulatory Visit: Payer: Self-pay

## 2020-03-01 ENCOUNTER — Ambulatory Visit: Payer: Self-pay | Admitting: Family Medicine

## 2020-03-01 ENCOUNTER — Emergency Department
Admission: EM | Admit: 2020-03-01 | Discharge: 2020-03-01 | Disposition: A | Discharge status: Home / Self Care | Payer: Medicare Other | Attending: Emergency Medicine | Admitting: Emergency Medicine

## 2020-03-01 ENCOUNTER — Emergency Department: Payer: Medicare Other

## 2020-03-01 DIAGNOSIS — Y939 Activity, unspecified: Secondary | ICD-10-CM | POA: Insufficient documentation

## 2020-03-01 DIAGNOSIS — Y929 Unspecified place or not applicable: Secondary | ICD-10-CM | POA: Insufficient documentation

## 2020-03-01 DIAGNOSIS — S32030A Wedge compression fracture of third lumbar vertebra, initial encounter for closed fracture: Secondary | ICD-10-CM | POA: Diagnosis not present

## 2020-03-01 DIAGNOSIS — Y999 Unspecified external cause status: Secondary | ICD-10-CM | POA: Diagnosis not present

## 2020-03-01 DIAGNOSIS — X58XXXA Exposure to other specified factors, initial encounter: Secondary | ICD-10-CM | POA: Diagnosis not present

## 2020-03-01 DIAGNOSIS — Z79899 Other long term (current) drug therapy: Secondary | ICD-10-CM | POA: Diagnosis not present

## 2020-03-01 DIAGNOSIS — M4856XA Collapsed vertebra, not elsewhere classified, lumbar region, initial encounter for fracture: Secondary | ICD-10-CM | POA: Diagnosis not present

## 2020-03-01 DIAGNOSIS — S3992XA Unspecified injury of lower back, initial encounter: Secondary | ICD-10-CM | POA: Diagnosis present

## 2020-03-01 DIAGNOSIS — M545 Low back pain: Secondary | ICD-10-CM | POA: Diagnosis not present

## 2020-03-01 LAB — COMPREHENSIVE METABOLIC PANEL
ALT: 16 U/L (ref 0–44)
AST: 26 U/L (ref 15–41)
Albumin: 4.2 g/dL (ref 3.5–5.0)
Alkaline Phosphatase: 143 U/L — ABNORMAL HIGH (ref 38–126)
Anion gap: 11 (ref 5–15)
BUN: 12 mg/dL (ref 8–23)
CO2: 28 mmol/L (ref 22–32)
Calcium: 9.3 mg/dL (ref 8.9–10.3)
Chloride: 99 mmol/L (ref 98–111)
Creatinine, Ser: 0.7 mg/dL (ref 0.44–1.00)
GFR calc Af Amer: >60 mL/min (ref >60)
GFR calc non Af Amer: >60 mL/min (ref >60)
Glucose, Bld: 94 mg/dL (ref 70–99)
Potassium: 3.9 mmol/L (ref 3.5–5.1)
Sodium: 138 mmol/L (ref 135–145)
Total Bilirubin: 0.8 mg/dL (ref 0.3–1.2)
Total Protein: 7.5 g/dL (ref 6.5–8.1)

## 2020-03-01 LAB — CBC WITH DIFFERENTIAL/PLATELET
Abs Immature Granulocytes: 0.02 10*3/uL (ref 0.00–0.07)
Basophils Absolute: 0 10*3/uL (ref 0.0–0.1)
Basophils Relative: 1 %
Eosinophils Absolute: 0.1 10*3/uL (ref 0.0–0.5)
Eosinophils Relative: 2 %
HCT: 49.7 % — ABNORMAL HIGH (ref 36.0–46.0)
Hemoglobin: 16.4 g/dL — ABNORMAL HIGH (ref 12.0–15.0)
Immature Granulocytes: 0 %
Lymphocytes Relative: 19 %
Lymphs Abs: 1.5 10*3/uL (ref 0.7–4.0)
MCH: 32.5 pg (ref 26.0–34.0)
MCHC: 33 g/dL (ref 30.0–36.0)
MCV: 98.4 fL (ref 80.0–100.0)
Monocytes Absolute: 0.7 10*3/uL (ref 0.1–1.0)
Monocytes Relative: 9 %
Neutro Abs: 5.3 10*3/uL (ref 1.7–7.7)
Neutrophils Relative %: 69 %
Platelets: 316 10*3/uL (ref 150–400)
RBC: 5.05 MIL/uL (ref 3.87–5.11)
RDW: 12 % (ref 11.5–15.5)
WBC: 7.7 10*3/uL (ref 4.0–10.5)
nRBC: 0 % (ref 0.0–0.2)

## 2020-03-01 LAB — URINALYSIS, COMPLETE (UACMP) WITH MICROSCOPIC
Bacteria, UA: NONE SEEN
Bilirubin Urine: NEGATIVE
Glucose, UA: NEGATIVE mg/dL
Hgb urine dipstick: NEGATIVE
Ketones, ur: 5 mg/dL — AB
Leukocytes,Ua: NEGATIVE
Nitrite: NEGATIVE
Protein, ur: NEGATIVE mg/dL
Specific Gravity, Urine: 1.014 (ref 1.005–1.030)
pH: 5 (ref 5.0–8.0)

## 2020-03-01 LAB — LACTIC ACID, PLASMA: Lactic Acid, Venous: 2 mmol/L (ref 0.5–1.9)

## 2020-03-01 MED ORDER — OXYCODONE-ACETAMINOPHEN 5-325 MG PO TABS
1.0000 | ORAL_TABLET | Freq: Once | ORAL | Status: AC
  Administered 2020-03-01: 1 via ORAL
  Filled 2020-03-01: qty 1

## 2020-03-01 MED ORDER — SODIUM CHLORIDE 0.9 % IV BOLUS
1000.0000 mL | Freq: Once | INTRAVENOUS | Status: AC
  Administered 2020-03-01: 1000 mL via INTRAVENOUS

## 2020-03-01 MED ORDER — OXYCODONE-ACETAMINOPHEN 5-325 MG PO TABS: 1.0000 | ORAL_TABLET | ORAL | 0 refills | Status: AC | PRN

## 2020-03-01 MED ORDER — ONDANSETRON 4 MG PO TBDP
4.0000 mg | ORAL_TABLET | Freq: Once | ORAL | Status: AC
  Administered 2020-03-01: 4 mg via ORAL
  Filled 2020-03-01: qty 1

## 2020-03-01 NOTE — ED Provider Notes (Signed)
Emergency Department Provider Note  ____________________________________________  Time seen: Approximately 3:45 PM  I have reviewed the triage vital signs and the nursing notes.   HISTORY  Chief Complaint Back Pain   Historian Patient     HPI Pamela Ball is a 74 y.o. female presents to the emergency department with nausea and low back pain that patient states started last night. She denies radiation of pain down the lower extremities.  Patient states that she has pain with walking but can ambulate.  No bowel or bladder incontinence or saddle anesthesia.  Patient denies a history of pyelonephritis or nephrolithiasis.   Past Medical History:  Diagnosis Date  . Abdominal hernia with obstruction and without gangrene   . Anxiety 02/15/2017   denies  . Bipolar 1 disorder, depressed, full remission ([place]) 02/15/2017  . Bipolar affective disorder ([place])   . Depression   . GERD (gastroesophageal reflux disease) 02/15/2017  . Glaucoma   . Headache   . Hiatal hernia   . History of abdominal hernia 05/21/2017  . History of compression fracture of spine 02/15/2017  . Hyperlipidemia   . Incisional hernia   . Incisional ventral hernia w obstruction 06/02/2017  . Normocytic anemia 05/23/2017  . 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 Presbycusis of both ears 02/15/2017     Immunizations up to date:  Yes.     Past Medical History:  Diagnosis Date  . Abdominal hernia with obstruction and without gangrene   . Anxiety 02/15/2017   denies  . Bipolar 1 disorder, depressed, full remission ([place]) 02/15/2017  . Bipolar affective disorder ([place])   . Depression   . GERD (gastroesophageal reflux disease) 02/15/2017  . Glaucoma   . Headache   . Hiatal hernia   . History of abdominal hernia 05/21/2017  . History of compression fracture of spine 02/15/2017  . Hyperlipidemia   . Incisional hernia   . Incisional ventral hernia w obstruction 06/02/2017  . Normocytic anemia 05/23/2017   . 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 Presbycusis of both ears 02/15/2017    Patient Active Problem List   Diagnosis Date Noted  . Osteopenia of right foot 02/11/2019  . Malnutrition of moderate degree 12/27/2018  . Lumbar hernia 11/19/2018  . Elevated troponin 09/26/2018  . Incisional hernia, without obstruction or gangrene 08/28/2018  . Allergic rhinitis due to allergen 05/14/2018  . Abdominal hernia with obstruction and without gangrene   . Normocytic anemia 05/23/2017  . History of abdominal hernia 05/21/2017  . Bipolar 1 disorder, depressed, full remission ([place]) 02/15/2017  . GERD (gastroesophageal reflux disease) 02/15/2017  . Centrilobular emphysema ([place]) 02/15/2017  . 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 Hyperlipidemia 02/15/2017  . Presbycusis of both ears 02/15/2017  . History of compression fracture of spine 02/15/2017  . Anxiety 02/15/2017    Past Surgical History:  Procedure Laterality Date  . ABDOMINAL HYSTERECTOMY  11/06/2006  . CESAREAN SECTION     x3  . COLONOSCOPY WITH PROPOFOL N/A 06/25/2018   Procedure: COLONOSCOPY WITH PROPOFOL;  Surgeon: Jonathon Bellows, MD;  Location: [facility] ENDOSCOPY;  Service: Endoscopy;  Laterality: N/A;  . KYPHOPLASTY    . KYPHOPLASTY N/A 09/23/2018   Procedure: KYPHOPLASTY T10;  Surgeon: Hessie Knows, MD;  Location: [facility] ORS;  Service: Orthopedics;  Laterality: N/A;  . ORIF HUMERUS FRACTURE Left 08/07/2019   Procedure: OPEN REDUCTION INTERNAL FIXATION (ORIF) LEFT DISTAL HUMERUS FRACTURE;  Surgeon: Altamese [place], MD;  Location: [place];  Service: Orthopedics;  Laterality: Left;  .  VENTRAL HERNIA REPAIR N/A 06/02/2017   Procedure: exploratory laparotomy repair of incarcerated  VENTRAL hernia;  Surgeon: Florene Glen, MD;  Location: [facility] ORS;  Service: General;  Laterality: N/A;  . VENTRAL HERNIA REPAIR N/A 08/29/2018   Procedure: LAPAROSCOPIC INCISIONAL HERNIA;  Surgeon: Olean Ree, MD;  Location: [facility] ORS;   Service: General;  Laterality: N/A;    Prior to Admission medications   Medication Sig Start Date End Date Taking? Authorizing Provider  buPROPion (WELLBUTRIN XL) 150 MG 24 hr tablet Take 150 mg by mouth daily.  01/11/15   [provider]  busPIRone (BUSPAR) 10 MG tablet Take 1 tablet (10 mg total) by mouth daily. Prescribed by Psychiatry Dr Kasandra Knudsen 11/14/17   Parks Ranger, Devonne Doughty, DO  cholecalciferol (VITAMIN D) 1000 units tablet Take 1,000 Units by mouth daily.    [provider]  metoprolol succinate (TOPROL-XL) 25 MG 24 hr tablet Take 1 tablet (25 mg total) by mouth daily. 09/27/18 08/07/19  Henreitta Leber, MD  Multiple Vitamin (MULTI-VITAMINS) TABS Take 1 tablet by mouth daily.     [provider]  OLANZapine (ZYPREXA) 5 MG tablet Take 5 mg by mouth at bedtime.  01/11/15   [provider]  omeprazole (PRILOSEC) 20 MG capsule TAKE 1 CAPSULE (20 MG TOTAL) BY MOUTH DAILY BEFORE BREAKFAST. 02/27/20   Karamalegos, Alexander J, DO  ondansetron (ZOFRAN-ODT) 4 MG disintegrating tablet TAKE 1 TABLET BY MOUTH EVERY 8 HOURS AS NEEDED FOR NAUSEA AND VOMITING 01/01/20   Parks Ranger, Devonne Doughty, DO  oxyCODONE-acetaminophen (PERCOCET/ROXICET) 5-325 MG tablet Take 1 tablet by mouth every 4 (four) hours as needed for up to 3 days. 03/01/20 03/04/20  Lannie Fields, PA-C  PROAIR HFA 108 (90 Base) MCG/ACT inhaler INHALE 2 PUFFS INTO THE LUNGS EVERY 4 HOURS AS NEEDED FOR WHEEZING OR SHORTNESS OF BREATH (COUGH) Patient taking differently: Inhale 2 puffs into the lungs every 4 (four) hours as needed for wheezing or shortness of breath.  06/03/19   Karamalegos, Devonne Doughty, DO  simvastatin (ZOCOR) 20 MG tablet Take 1 tablet (20 mg total) by mouth daily. 06/10/19   Karamalegos, Devonne Doughty, DO  venlafaxine XR (EFFEXOR-XR) 75 MG 24 hr capsule Take 75 mg by mouth daily with breakfast.  01/11/15   [provider]  vitamin A 10000 UNIT capsule Take 10,000 Units by mouth daily.    [provider]  vitamin C (ASCORBIC ACID) 500 MG tablet Take 500 mg by mouth daily.    [provider]    Allergies Patient has no known allergies.  Family History  Problem Relation Age of Onset  . Cancer Mother        cancer  . Breast cancer Mother 59    Social History Social History   Tobacco Use  . Smoking status: Never Smoker  . Smokeless tobacco: Never Used  Substance Use Topics  . Alcohol use: No  . Drug use: No     Review of Systems  Constitutional: No fever/chills Eyes:  No discharge ENT: No upper respiratory complaints. Respiratory: no cough. No SOB/ use of accessory muscles to breath Gastrointestinal: No abdominal pain.  Musculoskeletal: Negative for musculoskeletal pain. Skin: Negative for rash, abrasions, lacerations, ecchymosis.    ____________________________________________   PHYSICAL EXAM:  VITAL SIGNS: ED Triage Vitals  Enc Vitals Group     BP 03/01/20 1347 (!) 218/78     Pulse Rate 03/01/20 1347 85     Resp 03/01/20 1347 18  Temp 03/01/20 1347 98.9 F (37.2 C)     Temp Source 03/01/20 1347 Oral     SpO2 03/01/20 1347 94 %     Weight 03/01/20 1342 125 lb (56.7 kg)     Height 03/01/20 1342 4\' 10"  (1.473 m)     Head Circumference --      Peak Flow --      Pain Score 03/01/20 1345 10     Pain Loc --      Pain Edu? --      Excl. in [place]? --      Constitutional: Alert and oriented. Well appearing and in no acute distress. Eyes: Conjunctivae are normal. PERRL. EOMI. Head: Atraumatic. Cardiovascular: Normal rate, regular rhythm. Normal S1 and S2.  Good peripheral circulation. Respiratory: Normal respiratory effort without tachypnea or retractions. Lungs CTAB. Good air entry to the bases with no decreased or absent breath sounds Gastrointestinal: Bowel sounds x 4 quadrants. Soft and nontender to palpation. No guarding or rigidity. No distention. No CVA tenderness. Musculoskeletal: Full range of motion to all extremities. No  obvious deformities noted Neurologic:  Normal for age. No gross focal neurologic deficits are appreciated.  Skin:  Skin is warm, dry and intact. No rash noted. Psychiatric: Mood and affect are normal for age. Speech and behavior are normal.   ____________________________________________   LABS (all labs ordered are listed, but only abnormal results are displayed)  Labs Reviewed  URINALYSIS, COMPLETE (UACMP) WITH MICROSCOPIC - Abnormal; Notable for the following components:      Result Value   Color, Urine YELLOW (*)    APPearance CLEAR (*)    Ketones, ur 5 (*)    All other components within normal limits  CBC WITH DIFFERENTIAL/PLATELET - Abnormal; Notable for the following components:   Hemoglobin 16.4 (*)    HCT 49.7 (*)    All other components within normal limits  COMPREHENSIVE METABOLIC PANEL - Abnormal; Notable for the following components:   Alkaline Phosphatase 143 (*)    All other components within normal limits  LACTIC ACID, PLASMA - Abnormal; Notable for the following components:   Lactic Acid, Venous 2.0 (*)    All other components within normal limits  LACTIC ACID, PLASMA   ____________________________________________  EKG   ____________________________________________  RADIOLOGY Unk Pinto, personally viewed and evaluated these images (plain radiographs) as part of my medical decision making, as well as reviewing the written report by the radiologist.    DG Lumbar Spine 2-3 Views  Result Date: 03/01/2020 CLINICAL DATA:  Low back pain for 2 days. Pain onset after getting out of a lodged urine twisting. Concern for compression fracture. EXAM: LUMBAR SPINE - 2-3 VIEW COMPARISON:  Reformats from abdominopelvic CT 11/12/2018 FINDINGS: Chronic compression fractures of L1 and L2 with focal kyphosis in vertebral augmentation. Chronic augmented compression fracture of T11. Mild L3 compression fracture is new from prior exam but age indeterminate. Facet  hypertrophy in the lower lumbar spine. The sacroiliac joints are congruent. Tacks from prior anterior abdominal wall hernia repair. Aortic atherosclerosis. IMPRESSION: 1. Mild L3 compression fracture is new from January 2020 but age indeterminate. 2. Chronic L1 and L2 compression fractures with vertebral augmentation in focal kyphosis, unchanged. Electronically Signed   By: Keith Rake M.D.   On: 03/01/2020 17:30    ____________________________________________    PROCEDURES  Procedure(s) performed:     Procedures     Medications  oxyCODONE-acetaminophen (PERCOCET/ROXICET) 5-325 MG per tablet 1 tablet (1 tablet Oral Given  03/01/20 1515)  ondansetron (ZOFRAN-ODT) disintegrating tablet 4 mg (4 mg Oral Given 03/01/20 1515)  sodium chloride 0.9 % bolus 1,000 mL (0 mLs Intravenous Stopped 03/01/20 1808)  oxyCODONE-acetaminophen (PERCOCET/ROXICET) 5-325 MG per tablet 1 tablet (1 tablet Oral Given 03/01/20 1901)  oxyCODONE-acetaminophen (PERCOCET/ROXICET) 5-325 MG per tablet 1 tablet (1 tablet Oral Given 03/01/20 2015)     ____________________________________________   INITIAL IMPRESSION / ASSESSMENT AND PLAN / ED COURSE  Pertinent labs & imaging results that were available during my care of the patient were reviewed by me and considered in my medical decision making (see chart for details).  Clinical Course as of Mar 01 2017  Mon Mar 01, 2020  1724 Leukocytes,Ua: NEGATIVE [JW]    Clinical Course User Index [JW] Lannie Fields, PA-C     Assessment and Plan:  Low back pain:  74 year old female presents to the emergency department with low back pain and dysuria for the past 2 days.  Patient was notably hypertensive at triage vital signs were otherwise reassuring.  Differential diagnosis includes cystitis, nephrolithiasis, pyelonephritis, compression fracture...  Urinalysis was noncontributory for cystitis.  No leukocytosis on CBC. Lactic acid level was elevated at two and  patient was given a liter of normal saline in the emergency department. CMP was within reference range. X-ray examination of the lumbar spine revealed a mild new L3 compression fracture.  Patient was placed in a TLSO brace. Patient stated that she referred to follow-up with Dr. Rudene Christians who has managed her chronic compression fractures in the past. She was discharged with Percocet. Return precautions were given to return with new or worsening symptoms. All patient questions were answered.  ____________________________________________  FINAL CLINICAL IMPRESSION(S) / ED DIAGNOSES  Final diagnoses:  Compression fracture of L3 vertebra, initial encounter ([place])      NEW MEDICATIONS STARTED DURING THIS VISIT:  ED Discharge Orders         Ordered    oxyCODONE-acetaminophen (PERCOCET/ROXICET) 5-325 MG tablet  Every 4 hours PRN     03/01/20 2007              This chart was dictated using voice recognition software/Dragon. Despite best efforts to proofread, errors can occur which can change the meaning. Any change was purely unintentional.     Lannie Fields, PA-C 03/01/20 2021    Arta Silence, MD 03/01/20 [phone]

## 2020-03-01 NOTE — Progress Notes (Signed)
Orthopedic Tech Progress Note Patient Details:  Pamela Ball 1946/09/22 [id number] Called in STAT order to [facility] for a TLSO BRACE Patient ID: Pamela Ball, female   DOB: 24-Oct-1946, 74 y.o.   MRN: [id number]   Pamela Ball 03/01/2020, 6:33 PM

## 2020-03-01 NOTE — ED Triage Notes (Signed)
Pt c/o low back pain x 2 days. States has a "bad back" pt states she was getting out of a lounge chair and twisted. A&O, in wheelchair. No falls. Has been taking ibuprofen. Denies painful urination.

## 2020-03-01 NOTE — Discharge Instructions (Signed)
Please make follow-up appointment with Dr. Rudene Christians. Please take Percocet every 4 hours for the next 3 days as needed for pain. Please wear your TLSO brace unless showering.

## 2020-03-01 NOTE — ED Notes (Signed)
This RN at bedside to start IV and get lab specimens. This RN unable to keep IV access but lab specimens were collected at this time

## 2020-03-01 NOTE — ED Notes (Addendum)
[facility] Ortho Tech, [phone], notified of TLSO order.

## 2020-03-01 NOTE — ED Notes (Signed)
Ortho tech from [facility] came and applied the TLSO brace onto pt. NOT [facility] ED STAFF> PA [place] notified

## 2020-03-03 ENCOUNTER — Other Ambulatory Visit: Payer: Self-pay

## 2020-03-03 ENCOUNTER — Ambulatory Visit
Admission: RE | Admit: 2020-03-03 | Discharge: 2020-03-03 | Disposition: A | Discharge status: Home / Self Care | Payer: Medicare Other | Source: Ambulatory Visit | Attending: Orthopedic Surgery | Admitting: Orthopedic Surgery

## 2020-03-03 ENCOUNTER — Other Ambulatory Visit: Payer: Self-pay | Admitting: Orthopedic Surgery

## 2020-03-03 DIAGNOSIS — S32030A Wedge compression fracture of third lumbar vertebra, initial encounter for closed fracture: Secondary | ICD-10-CM | POA: Diagnosis not present

## 2020-03-03 DIAGNOSIS — W010XXD Fall on same level from slipping, tripping and stumbling without subsequent striking against object, subsequent encounter: Secondary | ICD-10-CM | POA: Diagnosis not present

## 2020-03-03 DIAGNOSIS — S32010D Wedge compression fracture of first lumbar vertebra, subsequent encounter for fracture with routine healing: Secondary | ICD-10-CM | POA: Diagnosis not present

## 2020-03-04 ENCOUNTER — Other Ambulatory Visit: Payer: Self-pay | Admitting: Orthopedic Surgery

## 2020-03-08 ENCOUNTER — Encounter
Admission: RE | Admit: 2020-03-08 | Discharge: 2020-03-08 | Disposition: A | Discharge status: Home / Self Care | Payer: Medicare Other | Source: Ambulatory Visit | Attending: Orthopedic Surgery | Admitting: Orthopedic Surgery

## 2020-03-08 ENCOUNTER — Other Ambulatory Visit: Payer: Self-pay

## 2020-03-08 DIAGNOSIS — Z20822 Contact with and (suspected) exposure to covid-19: Secondary | ICD-10-CM | POA: Diagnosis not present

## 2020-03-08 DIAGNOSIS — J449 Chronic obstructive pulmonary disease, unspecified: Secondary | ICD-10-CM | POA: Diagnosis not present

## 2020-03-08 DIAGNOSIS — I10 Essential (primary) hypertension: Secondary | ICD-10-CM | POA: Diagnosis not present

## 2020-03-08 DIAGNOSIS — Z01818 Encounter for other preprocedural examination: Secondary | ICD-10-CM | POA: Diagnosis not present

## 2020-03-08 HISTORY — DX: Essential (primary) hypertension: I10

## 2020-03-08 NOTE — Patient Instructions (Addendum)
Your procedure is scheduled on: 03-11-20 THURSDAY Report to Same Day Surgery 2nd floor medical mall [facility] Entrance-take elevator on left to 2nd floor.  Check in with surgery information desk.) To find out your arrival time please call [phone] between 1PM - 3PM on 03-10-20 [facility]  Remember: Instructions that are not followed completely may result in serious medical risk, up to and including death, or upon the discretion of your surgeon and anesthesiologist your surgery may need to be rescheduled.    _x___ 1. Do not eat food after midnight the night before your procedure. NO GUM OR CANDY AFTER MIDNIGHT. You may drink clear liquids up to 2 hours before you are scheduled to arrive at the hospital for your procedure.  Do not drink clear liquids within 2 hours of your scheduled arrival to the hospital.  Clear liquids include  --Water or Apple juice without pulp  --Gatorade  --Black Coffee or Clear Tea (No milk, no creamers, do not add anything to the coffee or Tea-SUGAR OK TO ADD   ____Ensure clear carbohydrate drink on the way to the hospital for bariatric patients  _X___Ensure clear carbohydrate drink-FINISH DRINK 2 HOURS PRIOR TO [place]    __x__ 2. No Alcohol for 24 hours before or after surgery.   __x__3. No Smoking or e-cigarettes for 24 prior to surgery.  Do not use any chewable tobacco products for at least 6 hour prior to surgery   ____  4. Bring all medications with you on the day of surgery if instructed.    __x__ 5. Notify your doctor if there is any change in your medical condition     (cold, fever, infections).    x___6. On the morning of surgery brush your teeth with toothpaste and water.  You may rinse your mouth with mouth wash if you wish.  Do not swallow any toothpaste or mouthwash.   Do not wear jewelry, make-up, hairpins, clips or nail polish.  Do not wear lotions, powders, or perfumes.  Do not shave 48 hours prior to surgery. Men may  shave face and neck.  Do not bring valuables to the hospital.    [facility] is not responsible for any belongings or valuables.               Contacts, dentures or bridgework may not be worn into surgery.  Leave your suitcase in the car. After surgery it may be brought to your room.  For patients admitted to the hospital, discharge time is determined by your treatment team.  _  Patients discharged the day of surgery will not be allowed to drive home.  You will need someone to drive you home and stay with you the night of your procedure.    Please read over the following fact sheets that you were given:   [facility] Preparing for Surgery  _x___ TAKE THE FOLLOWING MEDICATION THE MORNING OF SURGERY WITH A SMALL SIP OF WATER. These include:  1.BUSPAR (BUSPIRONE)  2.WELLBUTRIN (BUPROPION)  3. ZYPREXA (OPLANZAPINE)  4. METOPROLOL (TOPROL)  5. ZOCOR (SIMVASTATIN)  6. EFFEXOR (VENLAFAXINE)  7. PRILOSEC (OMEPRAZOLE)  8.TAKE AN EXTRA PRILOSEC THE NIGHT BEFORE YOUR SURGERY  ____Fleets enema or Magnesium Citrate as directed.   _x___ Use CHG Soap or sage wipes as directed on instruction sheet   _X___ Use inhalers on the day of surgery and bring to hospital day of surgery-USE YOUR PROAIR INHALER THE MORNING OF SURGERY AND BRING INHALER WITH YOU TO THE  HOSPITAL  ____ Stop Metformin and Janumet 2 days prior to surgery.    ____ Take 1/2 of usual insulin dose the night before surgery and none on the morning surgery.   ____ Follow recommendations from Cardiologist, Pulmonologist or PCP regarding stopping Aspirin, Coumadin, Plavix ,Eliquis, Effient, or Pradaxa, and Pletal.  X____Stop Anti-inflammatories such as Advil, Aleve, Ibuprofen, Motrin, Naproxen, Naprosyn, Goodies powders or aspirin products NOW-OK to take Tylenol    _x___ Stop supplements until after surgery-STOP VITAMIN A AND C NOW-MAY RESUME AFTER SURGERY   ____ Bring C-Pap to the hospital.

## 2020-03-09 ENCOUNTER — Encounter
Admission: RE | Admit: 2020-03-09 | Discharge: 2020-03-09 | Disposition: A | Discharge status: Home / Self Care | Payer: Medicare Other | Source: Ambulatory Visit | Attending: Orthopedic Surgery | Admitting: Orthopedic Surgery

## 2020-03-09 ENCOUNTER — Other Ambulatory Visit: Admission: RE | Admit: 2020-03-09 | Payer: Medicare Other | Source: Ambulatory Visit

## 2020-03-09 DIAGNOSIS — Z01818 Encounter for other preprocedural examination: Secondary | ICD-10-CM | POA: Diagnosis not present

## 2020-03-09 DIAGNOSIS — I10 Essential (primary) hypertension: Secondary | ICD-10-CM | POA: Diagnosis not present

## 2020-03-09 DIAGNOSIS — Z20822 Contact with and (suspected) exposure to covid-19: Secondary | ICD-10-CM | POA: Diagnosis not present

## 2020-03-09 DIAGNOSIS — J449 Chronic obstructive pulmonary disease, unspecified: Secondary | ICD-10-CM | POA: Diagnosis not present

## 2020-03-09 LAB — SARS CORONAVIRUS 2 (TAT 6-24 HRS): SARS Coronavirus 2: NEGATIVE

## 2020-03-11 ENCOUNTER — Other Ambulatory Visit: Payer: Self-pay

## 2020-03-11 ENCOUNTER — Ambulatory Visit: Payer: Medicare Other

## 2020-03-11 ENCOUNTER — Encounter: Payer: Self-pay | Admitting: Orthopedic Surgery

## 2020-03-11 ENCOUNTER — Ambulatory Visit: Payer: Medicare Other | Admitting: Anesthesiology

## 2020-03-11 ENCOUNTER — Encounter
Admission: RE | Disposition: A | Discharge status: Home / Self Care | Payer: Self-pay | Source: Ambulatory Visit | Attending: Orthopedic Surgery

## 2020-03-11 ENCOUNTER — Ambulatory Visit
Admission: RE | Admit: 2020-03-11 | Discharge: 2020-03-11 | Disposition: A | Discharge status: Home / Self Care | Payer: Medicare Other | Source: Ambulatory Visit | Attending: Orthopedic Surgery | Admitting: Orthopedic Surgery

## 2020-03-11 DIAGNOSIS — F329 Major depressive disorder, single episode, unspecified: Secondary | ICD-10-CM | POA: Diagnosis not present

## 2020-03-11 DIAGNOSIS — K219 Gastro-esophageal reflux disease without esophagitis: Secondary | ICD-10-CM | POA: Diagnosis not present

## 2020-03-11 DIAGNOSIS — E119 Type 2 diabetes mellitus without complications: Secondary | ICD-10-CM | POA: Diagnosis not present

## 2020-03-11 DIAGNOSIS — E785 Hyperlipidemia, unspecified: Secondary | ICD-10-CM | POA: Diagnosis not present

## 2020-03-11 DIAGNOSIS — W19XXXA Unspecified fall, initial encounter: Secondary | ICD-10-CM | POA: Diagnosis not present

## 2020-03-11 DIAGNOSIS — M4856XA Collapsed vertebra, not elsewhere classified, lumbar region, initial encounter for fracture: Secondary | ICD-10-CM | POA: Diagnosis not present

## 2020-03-11 DIAGNOSIS — I10 Essential (primary) hypertension: Secondary | ICD-10-CM | POA: Diagnosis not present

## 2020-03-11 DIAGNOSIS — F319 Bipolar disorder, unspecified: Secondary | ICD-10-CM | POA: Insufficient documentation

## 2020-03-11 DIAGNOSIS — Z981 Arthrodesis status: Secondary | ICD-10-CM | POA: Diagnosis not present

## 2020-03-11 DIAGNOSIS — J432 Centrilobular emphysema: Secondary | ICD-10-CM | POA: Diagnosis not present

## 2020-03-11 DIAGNOSIS — S32030A Wedge compression fracture of third lumbar vertebra, initial encounter for closed fracture: Secondary | ICD-10-CM | POA: Diagnosis not present

## 2020-03-11 DIAGNOSIS — J45909 Unspecified asthma, uncomplicated: Secondary | ICD-10-CM | POA: Diagnosis not present

## 2020-03-11 DIAGNOSIS — Z79899 Other long term (current) drug therapy: Secondary | ICD-10-CM | POA: Insufficient documentation

## 2020-03-11 DIAGNOSIS — Z419 Encounter for procedure for purposes other than remedying health state, unspecified: Secondary | ICD-10-CM

## 2020-03-11 HISTORY — PX: KYPHOPLASTY: SHX5884

## 2020-03-11 SURGERY — KYPHOPLASTY
Anesthesia: Monitor Anesthesia Care | Site: Back

## 2020-03-11 MED ORDER — ONDANSETRON HCL 4 MG/2ML IJ SOLN
INTRAMUSCULAR | Status: AC
  Filled 2020-03-11: qty 2

## 2020-03-11 MED ORDER — ONDANSETRON HCL 4 MG/2ML IJ SOLN
INTRAMUSCULAR | Status: DC | PRN
  Administered 2020-03-11: 4 mg via INTRAVENOUS

## 2020-03-11 MED ORDER — CEFAZOLIN SODIUM-DEXTROSE 2-4 GM/100ML-% IV SOLN
INTRAVENOUS | Status: AC
  Filled 2020-03-11: qty 100

## 2020-03-11 MED ORDER — OXYCODONE-ACETAMINOPHEN 5-325 MG PO TABS: 1.0000 | ORAL_TABLET | Freq: Four times a day (QID) | ORAL | 0 refills | Status: DC | PRN

## 2020-03-11 MED ORDER — IOHEXOL 180 MG/ML  SOLN: INTRAMUSCULAR | Status: DC | PRN

## 2020-03-11 MED ORDER — OXYCODONE-ACETAMINOPHEN 5-325 MG PO TABS
ORAL_TABLET | ORAL | Status: AC
  Administered 2020-03-11: 1 via ORAL
  Filled 2020-03-11: qty 1

## 2020-03-11 MED ORDER — PROPOFOL 500 MG/50ML IV EMUL
INTRAVENOUS | Status: DC | PRN
  Administered 2020-03-11: 25 ug/kg/min via INTRAVENOUS

## 2020-03-11 MED ORDER — DEXAMETHASONE SODIUM PHOSPHATE 10 MG/ML IJ SOLN
INTRAMUSCULAR | Status: AC
  Filled 2020-03-11: qty 1

## 2020-03-11 MED ORDER — METOCLOPRAMIDE HCL 10 MG PO TABS: 5.0000 mg | ORAL_TABLET | Freq: Three times a day (TID) | ORAL | Status: DC | PRN

## 2020-03-11 MED ORDER — ONDANSETRON HCL 4 MG/2ML IJ SOLN: 4.0000 mg | Freq: Once | INTRAMUSCULAR | Status: DC | PRN

## 2020-03-11 MED ORDER — FENTANYL CITRATE (PF) 100 MCG/2ML IJ SOLN
INTRAMUSCULAR | Status: AC
  Administered 2020-03-11: 25 ug via INTRAVENOUS
  Filled 2020-03-11: qty 2

## 2020-03-11 MED ORDER — LACTATED RINGERS IV SOLN: INTRAVENOUS | Status: DC | PRN

## 2020-03-11 MED ORDER — DEXAMETHASONE SODIUM PHOSPHATE 10 MG/ML IJ SOLN
INTRAMUSCULAR | Status: DC | PRN
  Administered 2020-03-11: 10 mg via INTRAVENOUS

## 2020-03-11 MED ORDER — FENTANYL CITRATE (PF) 100 MCG/2ML IJ SOLN
25.0000 ug | INTRAMUSCULAR | Status: DC | PRN
  Administered 2020-03-11 (×3): 25 ug via INTRAVENOUS

## 2020-03-11 MED ORDER — ONDANSETRON HCL 4 MG/2ML IJ SOLN: 4.0000 mg | Freq: Four times a day (QID) | INTRAMUSCULAR | Status: DC | PRN

## 2020-03-11 MED ORDER — LACTATED RINGERS IV SOLN: INTRAVENOUS | Status: DC

## 2020-03-11 MED ORDER — EPINEPHRINE PF 1 MG/ML IJ SOLN
INTRAMUSCULAR | Status: AC
  Filled 2020-03-11: qty 1

## 2020-03-11 MED ORDER — CEFAZOLIN SODIUM-DEXTROSE 2-4 GM/100ML-% IV SOLN
2.0000 g | INTRAVENOUS | Status: AC
  Administered 2020-03-11: 2 g via INTRAVENOUS

## 2020-03-11 MED ORDER — BUPIVACAINE HCL (PF) 0.5 % IJ SOLN
INTRAMUSCULAR | Status: AC
  Filled 2020-03-11: qty 30

## 2020-03-11 MED ORDER — LIDOCAINE HCL (PF) 1 % IJ SOLN
INTRAMUSCULAR | Status: AC
  Filled 2020-03-11: qty 30

## 2020-03-11 MED ORDER — SODIUM CHLORIDE 0.9 % IV SOLN: INTRAVENOUS | Status: DC

## 2020-03-11 MED ORDER — BUPIVACAINE-EPINEPHRINE (PF) 0.5% -1:200000 IJ SOLN
INTRAMUSCULAR | Status: DC | PRN
  Administered 2020-03-11: 20 mL

## 2020-03-11 MED ORDER — OXYCODONE-ACETAMINOPHEN 5-325 MG PO TABS: 1.0000 | ORAL_TABLET | Freq: Four times a day (QID) | ORAL | Status: DC | PRN

## 2020-03-11 MED ORDER — PROPOFOL 10 MG/ML IV BOLUS
INTRAVENOUS | Status: DC | PRN
  Administered 2020-03-11: 15 mg via INTRAVENOUS

## 2020-03-11 MED ORDER — MIDAZOLAM HCL 2 MG/2ML IJ SOLN
INTRAMUSCULAR | Status: AC
  Filled 2020-03-11: qty 2

## 2020-03-11 MED ORDER — METOCLOPRAMIDE HCL 5 MG/ML IJ SOLN: 5.0000 mg | Freq: Three times a day (TID) | INTRAMUSCULAR | Status: DC | PRN

## 2020-03-11 MED ORDER — ONDANSETRON HCL 4 MG PO TABS: 4.0000 mg | ORAL_TABLET | Freq: Four times a day (QID) | ORAL | Status: DC | PRN

## 2020-03-11 MED ORDER — LIDOCAINE HCL 1 % IJ SOLN
INTRAMUSCULAR | Status: DC | PRN
  Administered 2020-03-11: 30 mL

## 2020-03-11 MED ORDER — MIDAZOLAM HCL 2 MG/2ML IJ SOLN
INTRAMUSCULAR | Status: DC | PRN
  Administered 2020-03-11 (×2): 1 mg via INTRAVENOUS

## 2020-03-11 MED ORDER — PROPOFOL 500 MG/50ML IV EMUL
INTRAVENOUS | Status: AC
  Filled 2020-03-11: qty 50

## 2020-03-11 MED ORDER — FENTANYL CITRATE (PF) 100 MCG/2ML IJ SOLN
INTRAMUSCULAR | Status: AC
  Filled 2020-03-11: qty 2

## 2020-03-11 SURGICAL SUPPLY — 18 items
CEMENT KYPHON CX01A KIT/MIXER (Cement) ×2 IMPLANT
COVER WAND RF STERILE (DRAPES) ×2 IMPLANT
DERMABOND ADVANCED (GAUZE/BANDAGES/DRESSINGS) ×1
DERMABOND ADVANCED .7 DNX12 (GAUZE/BANDAGES/DRESSINGS) ×1 IMPLANT
DEVICE BIOPSY BONE KYPHX (INSTRUMENTS) ×4 IMPLANT
DRAPE C-ARM XRAY 36X54 (DRAPES) ×2 IMPLANT
DURAPREP 26ML APPLICATOR (WOUND CARE) ×2 IMPLANT
GLOVE SURG SYN 9.0  PF PI (GLOVE) ×1
GLOVE SURG SYN 9.0 PF PI (GLOVE) ×1 IMPLANT
GOWN SRG 2XL LVL 4 RGLN SLV (GOWNS) ×1 IMPLANT
GOWN STRL NON-REIN 2XL LVL4 (GOWNS) ×1
GOWN STRL REUS W/ TWL LRG LVL3 (GOWN DISPOSABLE) ×1 IMPLANT
GOWN STRL REUS W/TWL LRG LVL3 (GOWN DISPOSABLE) ×1
PACK KYPHOPLASTY (MISCELLANEOUS) ×2 IMPLANT
RENTAL RFA GENERATOR (MISCELLANEOUS) IMPLANT
STRAP SAFETY 5IN WIDE (MISCELLANEOUS) ×2 IMPLANT
SWABSTK COMLB BENZOIN TINCTURE (MISCELLANEOUS) ×2 IMPLANT
TRAY KYPHOPAK 20/3 EXPRESS 1ST (MISCELLANEOUS) ×2 IMPLANT

## 2020-03-11 NOTE — Op Note (Signed)
Date 03/11/2020  Time 2:10 pm   PATIENT: Pamela Ball   PRE-OPERATIVE DIAGNOSIS:  closed wedge compression fracture of L3    POST-OPERATIVE DIAGNOSIS:  closed wedge compression fracture of L3   PROCEDURE:  Procedure(s): KYPHOPLASTY L3  SURGEON: Laurene Footman, MD   ASSISTANTS: None   ANESTHESIA:   local and MAC   EBL:  No intake/output data recorded.   BLOOD ADMINISTERED:none   DRAINS: none    LOCAL MEDICATIONS USED:  MARCAINE    and XYLOCAINE    SPECIMEN:   L3 vertebral body biopsy   DISPOSITION OF SPECIMEN:  Pathology   COUNTS:  YES   TOURNIQUET:  * No tourniquets in log *   IMPLANTS: Bone cement   DICTATION: .Dragon Dictation  patient was brought to the operating room and after adequate anesthesia was obtained the patient was placed prone.  C arm was brought in in good visualization of the affected level obtained on both AP and lateral projections.  After patient identification and timeout procedures were completed, local anesthetic was infiltrated with 10 cc 1% Xylocaine infiltrated subcutaneously.  This is done the area on the left side of the planned approach.  The back was then prepped and draped in the usual sterile manner and repeat timeout procedure carried out.  A spinal needle was brought down to the pedicle on the left side of  L3 and a 50-50 mix of 1% Xylocaine half percent Sensorcaine with epinephrine total of 20 cc injected.  After allowing this to set a small incision was made and the trocar was advanced into the vertebral body in an extrapedicular fashion.  Biopsy was obtained Drilling was carried out balloon inserted with inflation to  5 cc.  When the cement was appropriate consistency 5 cc were injected into the vertebral body without extravasation, good fill superior to inferior endplates and from right to left sides along the inferior endplate.  After the cement had set the trochar was removed and permanent C-arm views obtained.  The wound was closed with  Dermabond followed by Band-Aid   PLAN OF CARE: Discharge to home after PACU   PATIENT DISPOSITION:  PACU - hemodynamically stable.

## 2020-03-11 NOTE — Transfer of Care (Signed)
Immediate Anesthesia Transfer of Care Note  Patient: Pamela Ball  Procedure(s) Performed: L3 KYPHOPLASTY (N/A Back)  Patient Location: PACU  Anesthesia Type:MAC  Level of Consciousness: awake, alert  and oriented  Airway & Oxygen Therapy: Patient Spontanous Breathing and Patient connected to nasal cannula oxygen  Post-op Assessment: Report given to RN  Post vital signs: stable  Last Vitals:  Vitals Value Taken Time  BP 129/53 03/11/20 1403  Temp    Pulse 81 03/11/20 1405  Resp 20 03/11/20 1405  SpO2 94 % 03/11/20 1405  Vitals shown include unvalidated device data.  Last Pain:  Vitals:   03/11/20 1208  PainSc: 10-Worst pain ever         Complications: No apparent anesthesia complications

## 2020-03-11 NOTE — Anesthesia Preprocedure Evaluation (Signed)
Anesthesia Evaluation  Patient identified by MRN, date of birth, ID band Patient awake    Reviewed: Allergy & Precautions, H&P , NPO status , Patient's Chart, lab work & pertinent test results, reviewed documented beta blocker date and time   Airway Mallampati: II  TM Distance: >3 FB Neck ROM: full    Dental no notable dental hx. (+) Teeth Intact   Pulmonary COPD,    Pulmonary exam normal breath sounds clear to auscultation       Cardiovascular Exercise Tolerance: Poor hypertension, On Medications negative cardio ROS   Rhythm:regular Rate:Normal     Neuro/Psych  Headaches, Anxiety Depression Bipolar Disorder negative psych ROS   GI/Hepatic Neg liver ROS, hiatal hernia, GERD  ,  Endo/Other  negative endocrine ROSdiabetes  Renal/GU      Musculoskeletal   Abdominal   Peds  Hematology  (+) Blood dyscrasia, anemia ,   Anesthesia Other Findings   Reproductive/Obstetrics negative OB ROS                             Anesthesia Physical Anesthesia Plan  ASA: III  Anesthesia Plan: MAC   Post-op Pain Management:    Induction:   PONV Risk Score and Plan:   Airway Management Planned:   Additional Equipment:   Intra-op Plan:   Post-operative Plan:   Informed Consent: I have reviewed the patients History and Physical, chart, labs and discussed the procedure including the risks, benefits and alternatives for the proposed anesthesia with the patient or authorized representative who has indicated his/her understanding and acceptance.       Plan Discussed with: CRNA  Anesthesia Plan Comments:         Anesthesia Quick Evaluation

## 2020-03-11 NOTE — H&P (Signed)
Chief Complaint  Patient presents with  . Spine - Pain   Pamela Ball is a 74 y.o. female who presents today for evaluation of acute L3 compression fracture. She had a fall several weeks ago and developed severe increasing back pain over last couple weeks. She describes midline low back pain with no numbness tingling radicular symptoms. She has been taking oxycodone for pain. She typically ambulates with no assistive devices but has been having to use a wheelchair. She is having a hard time standing, walking. The only relief she gets is laying down.  Past Medical History: Past Medical History:  Diagnosis Date  . Asthma without status asthmaticus, unspecified  . Depression  . GERD (gastroesophageal reflux disease)  . Hyperlipidemia   Past Surgical History: Past Surgical History:  Procedure Laterality Date  . BACK SURGERY  Lumbar  . HYSTERECTOMY  . L1 kyphoplasty 02/11/2015  Dr.Leatrice Parilla  . LENS EYE SURGERY Bilateral 2016  [facility]- Dr. Sandra Cockayne   Past Family History: Family History  Problem Relation Age of Onset  . No Known Problems Mother  . No Known Problems Father   Medications: Current Outpatient Medications Ordered in Epic  Medication Sig Dispense Refill  . oxyCODONE-acetaminophen (PERCOCET) 5-325 mg tablet Take 1 tablet by mouth every 4 (four) hours as needed  . albuterol 90 mcg/actuation inhaler INHALE 2 PUFFS INTO THE LUNGS EVERY 4 HOURS AS NEEDED FOR WHEEZING OR SHORTNESS OF BREATH (COUGH)  . buPROPion (WELLBUTRIN XL) 150 MG XL tablet Take 150 mg by mouth once daily. 5  . busPIRone (BUSPAR) 10 MG tablet Take 10 mg by mouth 2 (two) times daily. 5  . multivitamin tablet Take by mouth  . OLANZapine (ZYPREXA) 7.5 MG tablet Take 7.5 mg by mouth nightly  . omeprazole (PRILOSEC) 20 MG DR capsule Take 20 mg by mouth once daily. 3  . ondansetron (ZOFRAN-ODT) 4 MG disintegrating tablet TAKE 1 TABLET BY MOUTH EVERY 8 HOURS AS NEEDED FOR NAUSEA AND VOMITING  . simvastatin  (ZOCOR) 20 MG tablet Take 20 mg by mouth once daily. 3  . venlafaxine (EFFEXOR-XR) 75 MG XR capsule Take 1 capsule by mouth once daily. 5  . vitamin A 10000 UNIT capsule Take by mouth   No current Epic-ordered facility-administered medications on file.   Allergies: No Known Allergies   Review of Systems:  A comprehensive 14 point ROS was performed, reviewed by me today, and the pertinent orthopaedic findings are documented in the HPI.  Exam: BP (!) 174/108  Wt 56.7 kg (125 lb)  BMI 26.13 kg/m  General:  Well developed, well nourished, no apparent distress, normal affect, presents in a wheelchair. Having a hard time standing without assistance.  HEENT: Head normocephalic, atraumatic, PERRL.   Abdomen: Soft, non tender, non distended, Bowel sounds present.  Heart: Examination of the heart reveals regular, rate, and rhythm. There is no murmur noted on ascultation. There is a normal apical pulse.  Lungs: Lungs are clear to auscultation. There is no wheeze, rhonchi, or crackles. There is normal expansion of bilateral chest walls.   Thoracolumbar spine: Examination of thoracolumbar spine shows spinous process tenderness along the mid lumbar spine with percussion. No paravertebral muscle tenderness. No sacral tenderness. No SI joint tenderness. She has good range of motion of both hips and is neurovascular intact in bilateral lower extremities.  CLINICAL DATA: Low back pain for 2 days. Pain onset after getting  out of a lodged urine twisting. Concern for compression fracture.   EXAM:  LUMBAR SPINE - 2-3 VIEW   COMPARISON: [facility] from abdominopelvic CT 11/12/2018   FINDINGS:  Chronic compression fractures of L1 and L2 with focal kyphosis in  vertebral augmentation. Chronic augmented compression fracture of  T11. Mild L3 compression fracture is new from prior exam but age  indeterminate. Facet hypertrophy in the lower lumbar spine. The  sacroiliac joints are congruent.  Tacks from prior anterior abdominal  wall hernia repair. Aortic atherosclerosis.   IMPRESSION:  1. Mild L3 compression fracture is new from January 2020 but age  indeterminate.  2. Chronic L1 and L2 compression fractures with vertebral  augmentation in focal kyphosis, unchanged.   CLINICAL DATA: 74 year old female status post fall 5 weeks ago with persistent left side low back pain. Age indeterminate L3 compression fracture on radiographs 2 days ago. Prior kyphoplasty.  EXAM: MRI LUMBAR SPINE WITHOUT CONTRAST  TECHNIQUE: Multiplanar, multisequence MR imaging of the lumbar spine was performed. No intravenous contrast was administered.  COMPARISON: Lumbar radiographs 03/01/2020. Lumbar MRI 09/26/2018. CT Abdomen and Pelvis 11/12/2018.  FINDINGS: Segmentation: Normal as seen on the prior CT.  Alignment: Chronic but increased exaggerated lumbar lordosis, progression related to the L3 findings today (below). 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 Mildly progressed lower thoracic kyphosis about T10 since 2019.  Vertebrae: L3 compression fracture with confluent marrow edema throughout the body. Inferior and superior endplate deformities with up to 50% central loss of vertebral body height. Additionally, the right L3 pedicle is fractured (series 7, image 6, nondisplaced. Marrow edema tracks into both pedicles greater on the right, and also the right lamina. The L3 spinous process and left lamina appear intact. Retropulsion of the posteroinferior endplate results in mild to moderate new spinal stenosis. No definite lateral recess stenosis. Mild to moderate new L3 foraminal stenosis greater on the right.  Chronic T10 compression fracture with augmentation, but increased retropulsion of the posteroinferior endplate since [id number] contributing to mild to moderate multifactorial spinal stenosis at that level (series 5, image 8) new since 2019. Up to mild cord  mass effect but no definite cord signal abnormality.  Chronic severe L1 and moderate to severe L2 compression fractures with augmentation appear stable, including pronounced chronic retropulsion of L1 with severe spinal stenosis just below the conus (series 5, image 10).  Normal background bone marrow signal. No other No acute osseous abnormality identified. Intact visible sacrum and SI joints.  Conus medullaris and cauda equina: Conus terminates just above the severe L1 level spinal stenosis as stated above. No definite lower thoracic spinal cord or conus signal abnormality.  Paraspinal and other soft tissues: Stable visible abdominal viscera. Chronic paraspinal muscle atrophy.  Disc levels:  T10-T11, T12-L1, and L3-L4 stenosis detailed above.  Other levels are stable since 2019 and without significant spinal stenosis.  IMPRESSION: 1. Acute to subacute L3 compression fracture with up to 50% loss of vertebral body height, fracture of the right L3 pedicle, and retropulsion resulting in new mild to moderate spinal and L3 neural foraminal stenosis greater on the right.  2. Chronic and previously augmented T10, L1, and L2 compression fractures. 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  3. But T10 posteroinferior endplate retropulsion has increased since 2019 with new mild to moderate spinal stenosis at that level. However, this appears stable since 11/12/2018.  4. No lower thoracic spinal cord or conus signal abnormality despite the above.   Electronically Signed By: Genevie Ann  M.D. On: 03/03/2020 12:55   Impression: Closed compression fracture of L3 lumbar vertebra, initial encounter (CMS-HCC) [S32.030A] Closed compression fracture of L3 lumbar vertebra, initial encounter (CMS-HCC) (primary encounter diagnosis)  Plan:  65. 74 year old female with evidence of L3  compression fracture. MRI confirms acute L3 compression fracture with no adjacent acute vertebral body compression deformities. Chronic fractures of T10, L1 and L2. Risks, benefits, complications of L3 kyphoplasty have been discussed with patient. Patient has agreed and consented the procedure with Dr. Hessie Knows  This note was generated in part with voice recognition software and I apologize for any typographical errors that were not detected and corrected.  Feliberto Gottron MPA-C    Electronically signed by Feliberto Gottron, PA at 03/04/2020 9:48 AM EDT  Reviewed paper H+P, will be scanned into chart. No changes noted.

## 2020-03-11 NOTE — Discharge Instructions (Addendum)
AMBULATORY SURGERY  DISCHARGE INSTRUCTIONS   1) The drugs that you were given will stay in your system until tomorrow so for the next 24 hours you should not:  A) Drive an automobile B) Make any legal decisions C) Drink any alcoholic beverage   2) You may resume regular meals tomorrow.  Today it is better to start with liquids and gradually work up to solid foods.  You may eat anything you prefer, but it is better to start with liquids, then soup and crackers, and gradually work up to solid foods.   3) Please notify your doctor immediately if you have any unusual bleeding, trouble breathing, redness and pain at the surgery site, drainage, fever, or pain not relieved by medication. 4)   5) Your post-operative visit with Dr.                                     is: Date:                        Time:    Please call to schedule your post-operative visit.  6) Additional Instructions:       Remove Band-Aid on Saturday then okay to shower Try not to lift over 5 pounds or bend at the waist for the next 2 weeks but walk is much as you can Pain medicine as directed. Call office if you have a sudden increase in pain.

## 2020-03-15 LAB — SURGICAL PATHOLOGY

## 2020-03-15 NOTE — Anesthesia Postprocedure Evaluation (Signed)
Anesthesia Post Note  Patient: Pamela Ball  Procedure(s) Performed: L3 KYPHOPLASTY (N/A Back)  Patient location during evaluation: PACU Anesthesia Type: MAC Level of consciousness: awake and alert Pain management: pain level controlled Vital Signs Assessment: post-procedure vital signs reviewed and stable Respiratory status: spontaneous breathing, nonlabored ventilation, respiratory function stable and patient connected to nasal cannula oxygen Cardiovascular status: blood pressure returned to baseline and stable Postop Assessment: no apparent nausea or vomiting Anesthetic complications: no     Last Vitals:  Vitals:   03/11/20 1501 03/11/20 1527  BP: (!) 174/83 (!) 171/54  Pulse: 84   Resp: 18 16  Temp: 36.7 C   SpO2: 91% 97%    Last Pain:  Vitals:   03/11/20 1527  TempSrc:   PainSc: 3                  Molli Barrows

## 2020-03-26 DIAGNOSIS — Z9889 Other specified postprocedural states: Secondary | ICD-10-CM | POA: Diagnosis not present

## 2020-04-26 ENCOUNTER — Other Ambulatory Visit: Payer: Medicare Other

## 2020-04-26 ENCOUNTER — Telehealth: Payer: Self-pay | Admitting: Family Medicine

## 2020-04-26 DIAGNOSIS — E782 Mixed hyperlipidemia: Secondary | ICD-10-CM

## 2020-04-26 DIAGNOSIS — D649 Anemia, unspecified: Secondary | ICD-10-CM

## 2020-04-26 DIAGNOSIS — F3176 Bipolar disorder, in full remission, most recent episode depressed: Secondary | ICD-10-CM

## 2020-04-26 DIAGNOSIS — Z0000 Encounter for general adult medical examination without abnormal findings: Secondary | ICD-10-CM

## 2020-04-26 DIAGNOSIS — R7309 Other abnormal glucose: Secondary | ICD-10-CM

## 2020-04-26 NOTE — Telephone Encounter (Signed)
Signed orders  [name], [facility] 04/26/2020, 8:42 AM

## 2020-05-03 ENCOUNTER — Encounter: Payer: Medicare Other | Admitting: Family Medicine

## 2020-05-26 ENCOUNTER — Other Ambulatory Visit: Payer: Self-pay | Admitting: Family Medicine

## 2020-05-26 DIAGNOSIS — E782 Mixed hyperlipidemia: Secondary | ICD-10-CM

## 2020-05-26 NOTE — Telephone Encounter (Signed)
Requested medications are due for refill today?  Yes  Requested medications are on active medication list?  Yes  Last Refill:   06/10/2019  # 90 with 1 refill   Future visit scheduled?  Yes in 6 days.   Notes to Clinic:  Medication failed RX refill protocol due to no labs within the past 360 days.  Last labs were performed on 09/26/2018.

## 2020-05-29 ENCOUNTER — Emergency Department: Payer: Medicare Other

## 2020-05-29 ENCOUNTER — Emergency Department
Admission: EM | Admit: 2020-05-29 | Discharge: 2020-05-29 | Disposition: A | Discharge status: Home / Self Care | Payer: Medicare Other | Attending: Emergency Medicine | Admitting: Emergency Medicine

## 2020-05-29 ENCOUNTER — Other Ambulatory Visit: Payer: Self-pay

## 2020-05-29 DIAGNOSIS — S6992XA Unspecified injury of left wrist, hand and finger(s), initial encounter: Secondary | ICD-10-CM | POA: Diagnosis not present

## 2020-05-29 DIAGNOSIS — Y939 Activity, unspecified: Secondary | ICD-10-CM | POA: Insufficient documentation

## 2020-05-29 DIAGNOSIS — M25532 Pain in left wrist: Secondary | ICD-10-CM | POA: Diagnosis not present

## 2020-05-29 DIAGNOSIS — J323 Chronic sphenoidal sinusitis: Secondary | ICD-10-CM | POA: Diagnosis not present

## 2020-05-29 DIAGNOSIS — Y999 Unspecified external cause status: Secondary | ICD-10-CM | POA: Diagnosis not present

## 2020-05-29 DIAGNOSIS — J449 Chronic obstructive pulmonary disease, unspecified: Secondary | ICD-10-CM | POA: Insufficient documentation

## 2020-05-29 DIAGNOSIS — I10 Essential (primary) hypertension: Secondary | ICD-10-CM | POA: Diagnosis not present

## 2020-05-29 DIAGNOSIS — W19XXXA Unspecified fall, initial encounter: Secondary | ICD-10-CM | POA: Diagnosis not present

## 2020-05-29 DIAGNOSIS — Y929 Unspecified place or not applicable: Secondary | ICD-10-CM | POA: Diagnosis not present

## 2020-05-29 DIAGNOSIS — S199XXA Unspecified injury of neck, initial encounter: Secondary | ICD-10-CM | POA: Diagnosis not present

## 2020-05-29 DIAGNOSIS — I6381 Other cerebral infarction due to occlusion or stenosis of small artery: Secondary | ICD-10-CM | POA: Diagnosis not present

## 2020-05-29 DIAGNOSIS — R404 Transient alteration of awareness: Secondary | ICD-10-CM | POA: Diagnosis not present

## 2020-05-29 DIAGNOSIS — S060X0A Concussion without loss of consciousness, initial encounter: Secondary | ICD-10-CM | POA: Insufficient documentation

## 2020-05-29 DIAGNOSIS — S069X9A Unspecified intracranial injury with loss of consciousness of unspecified duration, initial encounter: Secondary | ICD-10-CM | POA: Diagnosis not present

## 2020-05-29 DIAGNOSIS — J3489 Other specified disorders of nose and nasal sinuses: Secondary | ICD-10-CM | POA: Diagnosis not present

## 2020-05-29 DIAGNOSIS — Z79899 Other long term (current) drug therapy: Secondary | ICD-10-CM | POA: Diagnosis not present

## 2020-05-29 DIAGNOSIS — Z743 Need for continuous supervision: Secondary | ICD-10-CM | POA: Diagnosis not present

## 2020-05-29 LAB — COMPREHENSIVE METABOLIC PANEL
ALT: 14 U/L (ref 0–44)
AST: 21 U/L (ref 15–41)
Albumin: 4 g/dL (ref 3.5–5.0)
Alkaline Phosphatase: 109 U/L (ref 38–126)
Anion gap: 13 (ref 5–15)
BUN: 17 mg/dL (ref 8–23)
CO2: 26 mmol/L (ref 22–32)
Calcium: 9.2 mg/dL (ref 8.9–10.3)
Chloride: 102 mmol/L (ref 98–111)
Creatinine, Ser: 0.81 mg/dL (ref 0.44–1.00)
GFR calc Af Amer: >60 mL/min (ref >60)
GFR calc non Af Amer: >60 mL/min (ref >60)
Glucose, Bld: 96 mg/dL (ref 70–99)
Potassium: 3.7 mmol/L (ref 3.5–5.1)
Sodium: 141 mmol/L (ref 135–145)
Total Bilirubin: 0.8 mg/dL (ref 0.3–1.2)
Total Protein: 6.8 g/dL (ref 6.5–8.1)

## 2020-05-29 LAB — CBC
HCT: 43.2 % (ref 36.0–46.0)
Hemoglobin: 14.2 g/dL (ref 12.0–15.0)
MCH: 33.6 pg (ref 26.0–34.0)
MCHC: 32.9 g/dL (ref 30.0–36.0)
MCV: 102.1 fL — ABNORMAL HIGH (ref 80.0–100.0)
Platelets: 218 10*3/uL (ref 150–400)
RBC: 4.23 MIL/uL (ref 3.87–5.11)
RDW: 13.4 % (ref 11.5–15.5)
WBC: 5.6 10*3/uL (ref 4.0–10.5)
nRBC: 0 % (ref 0.0–0.2)

## 2020-05-29 LAB — URINALYSIS, COMPLETE (UACMP) WITH MICROSCOPIC
Bacteria, UA: NONE SEEN
Bilirubin Urine: NEGATIVE
Glucose, UA: NEGATIVE mg/dL
Hgb urine dipstick: NEGATIVE
Ketones, ur: 20 mg/dL — AB
Nitrite: NEGATIVE
Protein, ur: NEGATIVE mg/dL
Specific Gravity, Urine: 1.018 (ref 1.005–1.030)
pH: 5 (ref 5.0–8.0)

## 2020-05-29 LAB — TROPONIN I (HIGH SENSITIVITY): Troponin I (High Sensitivity): 9 ng/L (ref <18)

## 2020-05-29 MED ORDER — SODIUM CHLORIDE 0.9 % IV BOLUS
500.0000 mL | Freq: Once | INTRAVENOUS | Status: AC
  Administered 2020-05-29: 500 mL via INTRAVENOUS

## 2020-05-29 NOTE — Discharge Instructions (Addendum)
Return to the ER for new, worsening or persistent headache, weakness, confusion, lethargy, vomiting or any other new or worsening symptoms that concern you.

## 2020-05-29 NOTE — ED Provider Notes (Signed)
[facility] Emergency Department Provider Note ____________________________________________   First MD Initiated Contact with Patient 05/29/20 1928     (approximate)  I have reviewed the triage vital signs and the nursing notes.   HISTORY  Chief Complaint Fall and Altered Mental Status    HPI Pamela Ball is a 74 y.o. female with PMH as noted below who presents after a fall earlier today around 7 AM in which the patient states that she turned quickly, lost her balance, and fell hitting her head.  She did not lose consciousness.  She also reports another fall yesterday.  Her husband reports that the patient has seemed slightly confused and dizzy since the fall earlier today.  The patient states she feels slightly dizzy, but denies headache.  She has had no nausea or vomiting.  She also reports some left wrist pain but denies other injuries.  Past Medical History:  Diagnosis Date  . Abdominal hernia with obstruction and without gangrene   . Anxiety 02/15/2017   denies  . Bipolar 1 disorder, depressed, full remission ([place]) 02/15/2017  . Bipolar affective disorder ([place])   . COPD (chronic obstructive pulmonary disease) ([place])   . Depression   . GERD (gastroesophageal reflux disease) 02/15/2017  . Glaucoma   . Headache   . Hiatal hernia   . History of abdominal hernia 05/21/2017  . History of compression fracture of spine 02/15/2017  . Hyperlipidemia   . Hypertension   . Incisional hernia   . Incisional ventral hernia w obstruction 06/02/2017  . Normocytic anemia 05/23/2017  . 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 Presbycusis of both ears 02/15/2017    Patient Active Problem List   Diagnosis Date Noted  . Osteopenia of right foot 02/11/2019  . Malnutrition of moderate degree 12/27/2018  . Lumbar hernia 11/19/2018  . Elevated troponin 09/26/2018  . Incisional hernia, without obstruction or gangrene 08/28/2018  . Allergic rhinitis due to  allergen 05/14/2018  . Abdominal hernia with obstruction and without gangrene   . Normocytic anemia 05/23/2017  . History of abdominal hernia 05/21/2017  . Bipolar 1 disorder, depressed, full remission ([place]) 02/15/2017  . GERD (gastroesophageal reflux disease) 02/15/2017  . Centrilobular emphysema ([place]) 02/15/2017  . 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 Hyperlipidemia 02/15/2017  . Presbycusis of both ears 02/15/2017  . History of compression fracture of spine 02/15/2017  . Anxiety 02/15/2017    Past Surgical History:  Procedure Laterality Date  . ABDOMINAL HYSTERECTOMY  11/06/2006  . CESAREAN SECTION     x3  . COLONOSCOPY WITH PROPOFOL N/A 06/25/2018   Procedure: COLONOSCOPY WITH PROPOFOL;  Surgeon: Jonathon Bellows, MD;  Location: [facility] ENDOSCOPY;  Service: Endoscopy;  Laterality: N/A;  . KYPHOPLASTY    . KYPHOPLASTY N/A 09/23/2018   Procedure: KYPHOPLASTY T10;  Surgeon: Hessie Knows, MD;  Location: [facility] ORS;  Service: Orthopedics;  Laterality: N/A;  . KYPHOPLASTY N/A 03/11/2020   Procedure: L3 KYPHOPLASTY;  Surgeon: Hessie Knows, MD;  Location: [facility] ORS;  Service: Orthopedics;  Laterality: N/A;  . ORIF HUMERUS FRACTURE Left 08/07/2019   Procedure: OPEN REDUCTION INTERNAL FIXATION (ORIF) LEFT DISTAL HUMERUS FRACTURE;  Surgeon: Altamese Taylor Mill, MD;  Location: [place];  Service: Orthopedics;  Laterality: Left;  Marland Kitchen VENTRAL HERNIA REPAIR N/A 06/02/2017   Procedure: exploratory laparotomy repair of incarcerated  VENTRAL hernia;  Surgeon: Florene Glen, MD;  Location: [facility] ORS;  Service: General;  Laterality: N/A;  . VENTRAL HERNIA REPAIR N/A 08/29/2018   Procedure:  LAPAROSCOPIC INCISIONAL HERNIA;  Surgeon: Olean Ree, MD;  Location: [facility] ORS;  Service: General;  Laterality: N/A;    Prior to Admission medications   Medication Sig Start Date End Date Taking? Authorizing Provider  buPROPion (WELLBUTRIN XL) 150 MG 24 hr tablet Take 150 mg by mouth in the morning and at bedtime.   01/11/15   [provider]  busPIRone (BUSPAR) 10 MG tablet Take 10 mg by mouth every morning. Prescribed by Psychiatry Dr Kasandra Knudsen 11/14/17   Parks Ranger, Devonne Doughty, DO  cholecalciferol (VITAMIN D) 1000 units tablet Take 1,000 Units by mouth daily.    [provider]  Ferrous Sulfate (IRON PO) Take 1 tablet by mouth daily.    [provider]  metoprolol succinate (TOPROL-XL) 25 MG 24 hr tablet Take 1 tablet (25 mg total) by mouth daily. Patient taking differently: Take 25 mg by mouth every morning.  09/27/18 03/11/20  Henreitta Leber, MD  Multiple Vitamin (MULTI-VITAMINS) TABS Take 1 tablet by mouth daily.     [provider]  OLANZapine (ZYPREXA) 5 MG tablet Take 5 mg by mouth at bedtime.  01/11/15   [provider]  OLANZapine (ZYPREXA) 7.5 MG tablet Take 7.5 mg by mouth every morning.  12/24/19   [provider]  omeprazole (PRILOSEC) 20 MG capsule TAKE 1 CAPSULE (20 MG TOTAL) BY MOUTH DAILY BEFORE BREAKFAST. 02/27/20   Karamalegos, Alexander J, DO  ondansetron (ZOFRAN-ODT) 4 MG disintegrating tablet TAKE 1 TABLET BY MOUTH EVERY 8 HOURS AS NEEDED FOR NAUSEA AND VOMITING Patient taking differently: Take 4 mg by mouth every 8 (eight) hours as needed for nausea or vomiting.  01/01/20   Parks Ranger, Devonne Doughty, DO  oxyCODONE-acetaminophen (PERCOCET) 5-325 MG tablet Take 1 tablet by mouth every 6 (six) hours as needed for severe pain. 03/11/20 03/11/21  Hessie Knows, MD  PROAIR HFA 108 [id number] Base) MCG/ACT inhaler INHALE 2 PUFFS INTO THE LUNGS EVERY 4 HOURS AS NEEDED FOR WHEEZING OR SHORTNESS OF BREATH (COUGH) Patient taking differently: Inhale 2 puffs into the lungs every 4 (four) hours as needed for wheezing or shortness of breath.  06/03/19   Parks Ranger, Devonne Doughty, DO  simvastatin (ZOCOR) 20 MG tablet Take 1 tablet (20 mg total) by mouth every morning. 05/26/20   Parks Ranger, Devonne Doughty, DO  venlafaxine XR (EFFEXOR-XR) 75 MG 24 hr capsule Take 75 mg by mouth  daily with breakfast.  01/11/15   [provider]  vitamin A 10000 UNIT capsule Take 10,000 Units by mouth daily.    [provider]  vitamin C (ASCORBIC ACID) 500 MG tablet Take 500 mg by mouth daily.    [provider]    Allergies Patient has no known allergies.  Family History  Problem Relation Age of Onset  . Cancer Mother        cancer  . Breast cancer Mother 39    Social History Social History   Tobacco Use  . Smoking status: Never Smoker  . Smokeless tobacco: Never Used  Vaping Use  . Vaping Use: Never used  Substance Use Topics  . Alcohol use: No  . Drug use: No    Review of Systems  Constitutional: No fever/chills. Eyes: No visual changes. ENT: No neck pain. Cardiovascular: Denies chest pain. Respiratory: Denies shortness of breath. Gastrointestinal: No vomiting or diarrhea. Genitourinary: Negative for dysuria.  Musculoskeletal: Negative for back pain. Skin: Negative for rash. Neurological: Negative for headaches, focal weakness or numbness.   ____________________________________________   PHYSICAL EXAM:  VITAL SIGNS: ED Triage Vitals  Enc Vitals Group     BP 05/29/20 1913 (!) 174/82     Pulse Rate 05/29/20 1909 92     Resp 05/29/20 1909 18     Temp 05/29/20 1909 98.9 F (37.2 C)     Temp Source 05/29/20 1909 Oral     SpO2 05/29/20 1909 96 %     Weight 05/29/20 1909 117 lb (53.1 kg)     Height 05/29/20 1909 4\' 10"  (1.473 m)     Head Circumference --      Peak Flow --      Pain Score 05/29/20 1909 0     Pain Loc --      Pain Edu? --      Excl. in [place]? --     Constitutional: Alert and oriented.  Somewhat chronically ill-appearing but in no acute distress. Eyes: Conjunctivae are normal.  EOMI.  PERRLA. Head: Subacute appearing ecchymosis to the forehead near the hairline. Nose: No congestion/rhinnorhea. Mouth/Throat: Mucous membranes are dry.   Neck: Normal range of motion.  Cardiovascular: Normal rate, regular  rhythm. Grossly normal heart sounds.  Good peripheral circulation. Respiratory: Normal respiratory effort.  No retractions. Lungs CTAB. Gastrointestinal: Soft and nontender. No distention.  Genitourinary: No flank tenderness. Musculoskeletal:  Extremities warm and well perfused.  Neurologic:  Normal speech and language.  Motor intact in all extremities.  Normal coordination.   Skin:  Skin is warm and dry. No rash noted. Psychiatric: Mood and affect are normal. Speech and behavior are normal.  ____________________________________________   LABS (all labs ordered are listed, but only abnormal results are displayed)  Labs Reviewed  CBC - Abnormal; Notable for the following components:      Result Value   MCV 102.1 (*)    All other components within normal limits  URINALYSIS, COMPLETE (UACMP) WITH MICROSCOPIC - Abnormal; Notable for the following components:   Color, Urine YELLOW (*)    APPearance HAZY (*)    Ketones, ur 20 (*)    Leukocytes,Ua TRACE (*)    All other components within normal limits  COMPREHENSIVE METABOLIC PANEL  TROPONIN I (HIGH SENSITIVITY)  TROPONIN I (HIGH SENSITIVITY)   ____________________________________________  EKG  ED ECG REPORT I, Arta Silence, the attending physician, personally viewed and interpreted this ECG.  Date: 05/29/2020 EKG Time: 1912 Rate: 97 Rhythm: normal sinus rhythm QRS Axis: normal Intervals: normal ST/T Wave abnormalities: Nonspecific ST abnormalities Narrative Interpretation: nonspecific abnormalities with no evidence of acute ischemia; no significant change when compared to EKG of 03/09/20  ____________________________________________  RADIOLOGY  CT head: No ICH or other acute findings CT cervical spine: No acute fracture XR L wrist: No acute fracture  ____________________________________________   PROCEDURES  Procedure(s) performed: No  Procedures  Critical Care performed:  No ____________________________________________   INITIAL IMPRESSION / ASSESSMENT AND PLAN / ED COURSE  Pertinent labs & imaging results that were available during my care of the patient were reviewed by me and considered in my medical decision making (see chart for details).  74 year old female presents for evaluation after 2 falls, one yesterday and one early this morning which appear to have been mechanical in nature, but per the husband the patient has been somewhat confused today.  I reviewed the past medical records in [place].  The patient was most recently seen in the ED in April with low back pain.  She had kyphoplasty in May of this year.  She was last admitted in early 2020  with a COPD exacerbation.  On exam, she is overall somewhat chronically ill-appearing but in no acute distress.  Her vital signs are normal except for hypertension.  Neurologic exam is nonfocal.  She is alert and oriented x3.  She does have some subacute appearing bruising to the forehead near the hairline, and mild pain to the left wrist, but no other evidence of trauma.  The falls appear mechanical, however given the report of possible confusion we will obtain lab work-up for AMS/weakness, in addition to CT head and C-spine as well as x-ray of the left wrist.  ----------------------------------------- 11:35 PM on 05/29/2020 -----------------------------------------  CT head, cervical spine, and x-ray of the wrist are all negative for acute findings.  The lab work-up is also unremarkable including a negative troponin.  There is no indication for repeat given the duration of the symptoms.  Urinalysis shows no acute abnormalities.  On reassessment, the patient states she feels well.  She is reassured by the results, and states that she really wants to go home.  On further discussion with the patient and her husband, the patient has not really been altered or confused, but just seemed a little bit "loopy" and  lightheaded earlier.  Overall this is most consistent with a concussion.  The husband confirms that she is at her baseline currently.  At this time, the patient is stable for discharge.  Return precautions given, and the patient expressed understanding.   ____________________________________________   FINAL CLINICAL IMPRESSION(S) / ED DIAGNOSES  Final diagnoses:  Concussion without loss of consciousness, initial encounter      NEW MEDICATIONS STARTED DURING THIS VISIT:  New Prescriptions   No medications on file     Note:  This document was prepared using Dragon voice recognition software and may include unintentional dictation errors.   Arta Silence, MD 05/29/20 [phone]

## 2020-05-29 NOTE — ED Triage Notes (Signed)
Pt arrives via EMS after she had a fall this AM at 7- pt states she got dizzy when she bent down to pick up her shoe and fell into the dresser- pt family states that she has gotten progressively more altered since then- pt has bruises on her forehead and right wrist- pt does not take blood thinners- pt oriented to person and place- pt disoriented to time and cannot state who the president is

## 2020-06-01 ENCOUNTER — Ambulatory Visit (INDEPENDENT_AMBULATORY_CARE_PROVIDER_SITE_OTHER): Payer: Medicare Other | Admitting: Family Medicine

## 2020-06-01 ENCOUNTER — Encounter: Payer: Self-pay | Admitting: Family Medicine

## 2020-06-01 ENCOUNTER — Other Ambulatory Visit: Payer: Self-pay

## 2020-06-01 VITALS — BP 146/60 | HR 101 | Temp 97.5°F | Resp 16 | Ht <= 58 in | Wt 118.0 lb

## 2020-06-01 DIAGNOSIS — J432 Centrilobular emphysema: Secondary | ICD-10-CM

## 2020-06-01 DIAGNOSIS — S060X0A Concussion without loss of consciousness, initial encounter: Secondary | ICD-10-CM | POA: Diagnosis not present

## 2020-06-01 DIAGNOSIS — Z8781 Personal history of (healed) traumatic fracture: Secondary | ICD-10-CM

## 2020-06-01 DIAGNOSIS — F3176 Bipolar disorder, in full remission, most recent episode depressed: Secondary | ICD-10-CM

## 2020-06-01 DIAGNOSIS — W19XXXA Unspecified fall, initial encounter: Secondary | ICD-10-CM

## 2020-06-01 NOTE — Assessment & Plan Note (Signed)
Stable chronic COPD without flare Second hand smoke, never smoker Not followed by Pulm. No prior PFTs/spirometry - Off Advair maintenance  Plan: 1. May resume maintenance in future 2. Encouraged keep up regular walking regimen to improve lung function 3. May use albuterol but only PRN - notify office if using more frequently or flare 4. Follow-up as needed 

## 2020-06-01 NOTE — Patient Instructions (Addendum)
Thank you for coming to the office today.  Keep watch on BP.  If taking BC it can raise BP  We will check again next time.  Keep on current meds.  If need more help at home, call us and we can arrange home health.  [facility] has COVID vaccine. You can contact them to schedule.  [place]:  [facility]) [place] [address]  Hours: Monday - Sunday 8:00am to 12:00pm  COVID-19 Vaccines By Appointment Only  Sign up for New Home List  [email or web address] or text "[email or web address]" to [phone] or call [phone]   Please schedule a Follow-up Appointment to: Return in about 4 weeks (around 06/29/2020) for Re-scheduled Annual Physical.  If you have any other questions or concerns, please feel free to call the office or send a message through [place]. You may also schedule an earlier appointment if necessary.  Additionally, you may be receiving a survey about your experience at our office within a few days to 1 week by e-mail or mail. We value your feedback.  [name], DO [place]

## 2020-06-01 NOTE — Progress Notes (Signed)
Subjective:    Patient ID: Pamela Ball, female    DOB: 06-Jun-1946, 74 y.o.   MRN: [id number]  Pamela Ball is a 74 y.o. female presenting on 06/01/2020 for Hospitalization Follow-up (fall- went to ED--Concussion without loss of consciousness)  She was originally scheduled for annual physical, but now here for hospital follow-up, has not had labs yet we will re-schedule this.  HPI   ED FOLLOW-UP VISIT  Hospital/Location: [facility] Date of ED Visit: 05/29/20  Reason for Presenting to ED: Fall, Concussion Primary (+Secondary) Diagnosis: Fall injury, Concussion without LOC  FOLLOW-UP  - ED provider note and record have been reviewed - Patient presents today about 3 days after recent ED visit. Brief summary of recent course, patient had symptoms of accidental fall and head injury with some bruising and was dizzy after but did not LOC, presented to ED on 7/24, testing in ED with labs chemistry blood count troponin, UA had trace leuks otherwise neg, CT Head and C-spine and Left X-ray wrist unremarkable without new acute injury. - Today reports overall has done well after discharge from ED. No further falls or episodes of dizziness or any syncope. She has had elevated BP by report, but not checking regularly.  - New medications on discharge: None - Changes to current meds on discharge: None  Followed by Dr Kasandra Knudsen [facility]) Psychiatry On current medications, her mood is doing well. See PHQ score below. No recent med changes, denies any dizziness due to medications.  I have reviewed the discharge medication list, and have reconciled the current and discharge medications today.   Depression screen [facility] 2/9 06/01/2020 10/22/2019 07/01/2019  Decreased Interest 0 3 0  Down, Depressed, Hopeless 0 3 0  PHQ - 2 Score 0 6 0  Altered sleeping 1 3 -  Tired, decreased energy 2 2 -  Change in appetite 0 2 -  Feeling bad or failure about yourself  0 0 -  Trouble concentrating 2 0 -    Moving slowly or fidgety/restless 1 1 -  Suicidal thoughts 0 0 -  PHQ-9 Score 6 14 -  Difficult doing work/chores Not difficult at all Not difficult at all -    Social History   Tobacco Use  . Smoking status: Never Smoker  . Smokeless tobacco: Never Used  Vaping Use  . Vaping Use: Never used  Substance Use Topics  . Alcohol use: No  . Drug use: No    Review of Systems Per HPI unless specifically indicated above     Objective:    BP (!) 146/60 (BP Location: Left Arm, Cuff Size: Normal)   Pulse 101   Temp (!) 97.5 F (36.4 C) (Temporal)   Resp 16   Ht 4\' 10"  (1.473 m)   Wt 118 lb (53.5 kg)   SpO2 96%   BMI 24.66 kg/m   Wt Readings from Last 3 Encounters:  06/01/20 118 lb (53.5 kg)  05/29/20 117 lb (53.1 kg)  03/11/20 125 lb (56.7 kg)    Physical Exam Vitals and nursing note reviewed.  Constitutional:      General: She is not in acute distress.    Appearance: She is well-developed. She is not diaphoretic.     Comments: Well-appearing, comfortable, cooperative  HENT:     Head: Normocephalic and atraumatic.  Eyes:     General:        Right eye: No discharge.        Left eye: No discharge.  Conjunctiva/sclera: Conjunctivae normal.  Neck:     Thyroid: No thyromegaly.  Cardiovascular:     Rate and Rhythm: Normal rate and regular rhythm.     Heart sounds: Normal heart sounds. No murmur heard.   Pulmonary:     Effort: Pulmonary effort is normal. No respiratory distress.     Breath sounds: Normal breath sounds. No wheezing or rales.  Musculoskeletal:        General: Normal range of motion.     Cervical back: Normal range of motion and neck supple.     Comments: She is able to stand from seated unassisted. Not using cane or walker. She can ambulate without difficulty.  Distal strength and sensation are intact.  Lymphadenopathy:     Cervical: No cervical adenopathy.  Skin:    General: Skin is warm and dry.     Findings: Bruising (facial and left arm, non  tender, no laceration or edema) present. No erythema or rash.  Neurological:     Mental Status: She is alert and oriented to person, place, and time.  Psychiatric:        Behavior: Behavior normal.     Comments: Well groomed, good eye contact, normal speech and thoughts      I have personally reviewed the radiology reports from 05/29/20  DG Wrist Complete Left Performed 05/29/2020 Final result  Study Result CLINICAL DATA: [place] this morning, wrist pain  EXAM: LEFT WRIST - COMPLETE 3+ VIEW  COMPARISON: None.  FINDINGS: Frontal, oblique, lateral, and ulnar deviated views of the left wrist are obtained. There is evidence of a prior healed distal left radial fracture with resulting volar angulation. 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 No acute bony abnormalities. Radiocarpal joint remains intact. Prominent osteoarthritis of the carpometacarpal joints. Bones are osteopenic. Soft tissues are unremarkable.  IMPRESSION: 1. No acute displaced fracture. 2. Sequela of prior healed fracture of the distal left radius. 3. Osteoarthritis carpometacarpal joints.   Electronically Signed By: Randa Ngo M.D. On: 05/29/2020 19:49   -------------------------------  CT Head Wo ContrastPerformed 05/29/2020 Final result  Study Result CLINICAL DATA: Dizziness, fell, altered level of consciousness  EXAM: CT HEAD WITHOUT CONTRAST  TECHNIQUE: Contiguous axial images were obtained from the base of the skull through the vertex without intravenous contrast.  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 No acute infarct or hemorrhage. Lateral ventricles and remaining midline structures are unremarkable. No acute extra-axial fluid collections. No mass effect.  Vascular: No hyperdense vessel or unexpected calcification.  Skull: Normal. Negative for fracture or focal lesion.  Sinuses/Orbits: Chronic polypoid mucosal  thickening within the sphenoid sinus. Remaining paranasal sinuses are clear.  Other: None.  IMPRESSION: 1. Stable exam, no acute process.   Electronically Signed By: Randa Ngo M.D. On: 05/29/2020 20:34  -------------------------------------------  CT Cervical Spine Wo ContrastPerformed 05/29/2020 Final result  Study Result CLINICAL DATA: Dizziness, fell this morning, altered level of consciousness  EXAM: CT CERVICAL SPINE WITHOUT CONTRAST  TECHNIQUE: Multidetector CT imaging of the cervical spine was performed without intravenous contrast. Multiplanar CT image reconstructions were also generated.  COMPARISON: None.  FINDINGS: Alignment: There is straightening of the lower cervical spine due to multilevel spondylosis from C4 through C7. Minimal anterolisthesis of C3 on C4 likely due to prominent facet hypertrophy. Otherwise alignment is anatomic.  Skull base and vertebrae: No acute displaced fractures.  Soft tissues and spinal canal: No prevertebral fluid or swelling. No visible canal hematoma.  Disc levels: There  is diffuse facet hypertrophy greatest from C2 through C4. Multilevel spondylosis is noted, greatest at C4-5, C5-6, and C6-7. There is no significant bony encroachment upon the central canal or neural foramina.  Upper chest: Airways patent. Emphysematous changes are seen at the lung apices.  Other: Reconstructed images demonstrate no additional findings.  IMPRESSION: 1. Multilevel cervical degenerative changes. No acute cervical spine fracture.   Electronically Signed By: Randa Ngo M.D. On: 05/29/2020 20:36    Results for orders placed or performed during the hospital encounter of 05/29/20  Comprehensive metabolic panel  Result Value Ref Range   Sodium 141 135 - 145 mmol/L   Potassium 3.7 3.5 - 5.1 mmol/L   Chloride 102 98 - 111 mmol/L   CO2 26 22 - 32 mmol/L   Glucose, Bld 96 70 - 99 mg/dL   BUN 17 8 - 23 mg/dL   Creatinine, Ser  0.81 0.44 - 1.00 mg/dL   Calcium 9.2 8.9 - 10.3 mg/dL   Total Protein 6.8 6.5 - 8.1 g/dL   Albumin 4.0 3.5 - 5.0 g/dL   AST 21 15 - 41 U/L   ALT 14 0 - 44 U/L   Alkaline Phosphatase 109 38 - 126 U/L   Total Bilirubin 0.8 0.3 - 1.2 mg/dL   GFR calc non Af Amer >60 >60 mL/min   GFR calc Af Amer >60 >60 mL/min   Anion gap 13 5 - 15  CBC  Result Value Ref Range   WBC 5.6 4.0 - 10.5 K/uL   RBC 4.23 3.87 - 5.11 MIL/uL   Hemoglobin 14.2 12.0 - 15.0 g/dL   HCT 43.2 36 - 46 %   MCV 102.1 (H) 80.0 - 100.0 fL   MCH 33.6 26.0 - 34.0 pg   MCHC 32.9 30.0 - 36.0 g/dL   RDW 13.4 11.5 - 15.5 %   Platelets 218 150 - 400 K/uL   nRBC 0.0 0.0 - 0.2 %  Urinalysis, Complete w Microscopic  Result Value Ref Range   Color, Urine YELLOW (A) YELLOW   APPearance HAZY (A) CLEAR   Specific Gravity, Urine 1.018 1.005 - 1.030   pH 5.0 5.0 - 8.0   Glucose, UA NEGATIVE NEGATIVE mg/dL   Hgb urine dipstick NEGATIVE NEGATIVE   Bilirubin Urine NEGATIVE NEGATIVE   Ketones, ur 20 (A) NEGATIVE mg/dL   Protein, ur NEGATIVE NEGATIVE mg/dL   Nitrite NEGATIVE NEGATIVE   Leukocytes,Ua TRACE (A) NEGATIVE   RBC / HPF 0-5 0 - 5 RBC/hpf   WBC, UA 0-5 0 - 5 WBC/hpf   Bacteria, UA NONE SEEN NONE SEEN   Squamous Epithelial / LPF 0-5 0 - 5   Mucus PRESENT   Troponin I (High Sensitivity)  Result Value Ref Range   Troponin I (High Sensitivity) 9 <18 ng/L      Assessment & Plan:   Problem List Items Addressed This Visit    History of compression fracture of spine   Centrilobular emphysema (HCC)    Stable chronic COPD without flare Second hand smoke, never smoker Not followed by Pulm. No prior PFTs/spirometry - Off Advair maintenance  Plan: 1. May resume maintenance in future 2. Encouraged keep up regular walking regimen to improve lung function 3. May use albuterol but only PRN - notify office if using more frequently or flare 4. Follow-up as needed      Bipolar 1 disorder, depressed, full remission ([place]) -  Primary    Stable, controlled on current regimen, chronic problem multiple psych dx,  primarily bipolar depression with mixed anxiety. Followed by Psychiatry now, Dr Kasandra Knudsen [facility]), currently receiving rx and managing  Plan: 1. Follow-up as scheduled with Psychiatry On medications current regimen.       Other Visit Diagnoses    Fall, initial encounter       Concussion without loss of consciousness, initial encounter          #Concussion / Fall Residual bruising improving No new injury identified, reviewed hospital records and X-ray and CT imaging. No further cognitive or memory or side effects of possible concussion injury She is able to ambulate without gait abnormality today She has walker if needed Caregiver with husband available She declines any Home Health services that I have offered today to order RN or PT, reconsider in future.  Return if worsening or recurrent falls.  No orders of the defined types were placed in this encounter.     Follow up plan: Return in about 4 weeks (around 06/29/2020) for Re-scheduled Annual Physical.  Orders already in. Will need to be drawn after next visit for Physical  [name], [facility] 06/01/2020, 8:34 AM

## 2020-06-01 NOTE — Assessment & Plan Note (Signed)
Stable, controlled on current regimen, chronic problem multiple psych dx, primarily bipolar depression with mixed anxiety. Followed by Psychiatry now, Dr Su ([facility]), currently receiving rx and managing  Plan: 1. Follow-up as scheduled with Psychiatry On medications current regimen. 

## 2020-06-22 ENCOUNTER — Encounter: Payer: Medicare Other | Admitting: Family Medicine

## 2020-06-28 ENCOUNTER — Ambulatory Visit (INDEPENDENT_AMBULATORY_CARE_PROVIDER_SITE_OTHER): Payer: Medicare Other | Admitting: Family Medicine

## 2020-06-28 ENCOUNTER — Other Ambulatory Visit: Payer: Self-pay

## 2020-06-28 ENCOUNTER — Other Ambulatory Visit: Payer: Self-pay | Admitting: Family Medicine

## 2020-06-28 ENCOUNTER — Encounter: Payer: Self-pay | Admitting: Family Medicine

## 2020-06-28 VITALS — BP 152/64 | HR 88 | Temp 97.8°F | Resp 16 | Ht <= 58 in | Wt 119.6 lb

## 2020-06-28 DIAGNOSIS — I10 Essential (primary) hypertension: Secondary | ICD-10-CM

## 2020-06-28 DIAGNOSIS — D649 Anemia, unspecified: Secondary | ICD-10-CM | POA: Diagnosis not present

## 2020-06-28 DIAGNOSIS — R7309 Other abnormal glucose: Secondary | ICD-10-CM | POA: Diagnosis not present

## 2020-06-28 DIAGNOSIS — E782 Mixed hyperlipidemia: Secondary | ICD-10-CM | POA: Diagnosis not present

## 2020-06-28 DIAGNOSIS — Z0000 Encounter for general adult medical examination without abnormal findings: Secondary | ICD-10-CM

## 2020-06-28 DIAGNOSIS — J432 Centrilobular emphysema: Secondary | ICD-10-CM | POA: Diagnosis not present

## 2020-06-28 DIAGNOSIS — F3176 Bipolar disorder, in full remission, most recent episode depressed: Secondary | ICD-10-CM

## 2020-06-28 DIAGNOSIS — J441 Chronic obstructive pulmonary disease with (acute) exacerbation: Secondary | ICD-10-CM

## 2020-06-28 DIAGNOSIS — K219 Gastro-esophageal reflux disease without esophagitis: Secondary | ICD-10-CM

## 2020-06-28 MED ORDER — AMLODIPINE BESYLATE 10 MG PO TABS: 10.0000 mg | ORAL_TABLET | Freq: Every day | ORAL | 3 refills | Status: DC

## 2020-06-28 NOTE — Telephone Encounter (Signed)
Requested Prescriptions  Pending Prescriptions Disp Refills   albuterol (VENTOLIN HFA) 108 (90 Base) MCG/ACT inhaler [Pharmacy Med Name: ALBUTEROL HFA (PROAIR) INHALER]  3    Sig: INHALE 2 PUFFS INTO THE LUNGS EVERY 4 HOURS AS NEEDED FOR WHEEZING OR SHORTNESS OF BREATH (COUGH)     Pulmonology:  Beta Agonists Failed - 06/28/2020 12:20 PM      Failed - One inhaler should last at least one month. If the patient is requesting refills earlier, contact the patient to check for uncontrolled symptoms.      Passed - Valid encounter within last 12 months    Recent Outpatient Visits          Today Annual physical exam   [place], DO   3 weeks ago Bipolar 1 disorder, depressed, full remission [facility])   [facility], Devonne Doughty, DO   8 months ago Centrilobular emphysema [facility])   [facility] Olin Hauser, DO   1 year ago Influenza A   [place], DO   1 year ago Pyelonephritis   [place], DO              omeprazole (PRILOSEC) 20 MG capsule [Pharmacy Med Name: OMEPRAZOLE DR 20 MG CAPSULE] 90 capsule 1    Sig: TAKE 1 CAPSULE (20 MG TOTAL) BY MOUTH DAILY BEFORE BREAKFAST.     Gastroenterology: Proton Pump Inhibitors Passed - 06/28/2020 12:20 PM      Passed - Valid encounter within last 12 months    Recent Outpatient Visits          Today Annual physical exam   [facility] Olin Hauser, DO   3 weeks ago Bipolar 1 disorder, depressed, full remission [facility])   [facility] Olin Hauser, DO   8 months ago Centrilobular emphysema [facility])   [facility] Olin Hauser, DO   1 year ago Influenza A   [facility] Olin Hauser, DO   1 year ago Pyelonephritis   [place], DO

## 2020-06-28 NOTE — Patient Instructions (Addendum)
Thank you for coming to the office today.  New BP medication Amlodipine 10mg  daily.  Keep on other meds  Try to check BP if you can and we can follow-up in 3 months  Labs today  Please schedule a Follow-up Appointment to: Return in about 3 months (around 09/28/2020) for 3 month HTN, mood PHQ9.  If you have any other questions or concerns, please feel free to call the office or send a message through [place]. You may also schedule an earlier appointment if necessary.  Additionally, you may be receiving a survey about your experience at our office within a few days to 1 week by e-mail or mail. We value your feedback.  [name], DO [place]

## 2020-06-28 NOTE — Assessment & Plan Note (Signed)
Previously elevated BP, off medication metoprolol - Home BP readings none  No known complications    Plan:  1. NEW START Amlodipine 10mg  daily 2. Encourage improved lifestyle - low sodium diet, regular exercise 3. Start monitor BP outside office, bring readings to next visit, if persistently >140/90 or new symptoms notify office sooner 4. Follow-up 3 months

## 2020-06-28 NOTE — Assessment & Plan Note (Signed)
Previously controlled cholesterol on statin and lifestyle Due lipids today  Plan: 1. Continue current meds - Simvastatin 20mg  daily 2. Encourage improved lifestyle - low carb/cholesterol, reduce portion size, continue improving regular exercise

## 2020-06-28 NOTE — Assessment & Plan Note (Signed)
Stable chronic COPD without flare Second hand smoke, never smoker Not followed by Pulm. No prior PFTs/spirometry - Off Advair maintenance  Plan: 1. May resume maintenance in future 2. Encouraged keep up regular walking regimen to improve lung function 3. May use albuterol but only PRN - notify office if using more frequently or flare 4. Follow-up as needed

## 2020-06-28 NOTE — Assessment & Plan Note (Signed)
Stable, controlled on current regimen, chronic problem multiple psych dx, primarily bipolar depression with mixed anxiety. Followed by Psychiatry now, Dr Kasandra Knudsen [facility]), currently receiving rx and managing  Plan: 1. Follow-up as scheduled with Psychiatry On medications current regimen.

## 2020-06-28 NOTE — Progress Notes (Signed)
Subjective:    Patient ID: Pamela Ball, female    DOB: 1945/11/28, 74 y.o.   MRN: [id number]  Pamela Ball is a 74 y.o. female presenting on 06/28/2020 for Annual Exam   HPI   Here for Annual Physical and due for fasting lab work. She had orange juice this AM.  Followed by Dr Kasandra Knudsen [facility]) Psychiatry On current medications, her mood is doing well. See PHQ score below. No recent med changes Her cat passed in July 2021 Caused stress  CHRONIC HTN: Reports previously on Metoprolol temporarily from hospital, now off med unsure how long. Not checking BP outside office, has elevated BP again today Current Meds - None   Denies CP, dyspnea, HA, edema, dizziness / lightheadedness  Centrilobular Emphysema No flare up recently Reports breathing well Using Albuterol PRN not too often.  HYPERLIPIDEMIA: - Reports no concerns. Due for lipids - Currently taking Simvastatin 20mg , tolerating well without side effects or myalgias   Health Maintenance: Due to schedule Mammogram still, order is in. Due flu shot when available. Declines PNA vaccine today.  Depression screen [facility] 2/9 06/28/2020 06/01/2020 10/22/2019  Decreased Interest 0 0 3  Down, Depressed, Hopeless 0 0 3  PHQ - 2 Score 0 0 6  Altered sleeping 2 1 3   Tired, decreased energy 3 2 2   Change in appetite 0 0 2  Feeling bad or failure about yourself  0 0 0  Trouble concentrating 0 2 0  Moving slowly or fidgety/restless 0 1 1  Suicidal thoughts 0 0 0  PHQ-9 Score 5 6 14   Difficult doing work/chores Not difficult at all Not difficult at all Not difficult at all  Some recent data might be hidden   GAD 7 : Generalized Anxiety Score 06/28/2020 10/22/2019  Nervous, Anxious, on Edge 0 3  Control/stop worrying 0 1  Worry too much - different things 1 2  Trouble relaxing 3 2  Restless 3 3  Easily annoyed or irritable 0 1  Afraid - awful might happen 0 1  Total GAD 7 Score 7 13  Anxiety Difficulty Somewhat  difficult Not difficult at all     Past Medical History:  Diagnosis Date  . Abdominal hernia with obstruction and without gangrene   . Anxiety 02/15/2017   denies  . Bipolar 1 disorder, depressed, full remission ([place]) 02/15/2017  . Bipolar affective disorder ([place])   . COPD (chronic obstructive pulmonary disease) ([place])   . Depression   . GERD (gastroesophageal reflux disease) 02/15/2017  . Glaucoma   . Headache   . Hiatal hernia   . History of abdominal hernia 05/21/2017  . History of compression fracture of spine 02/15/2017  . Hyperlipidemia   . Hypertension   . Incisional hernia   . Incisional ventral hernia w obstruction 06/02/2017  . Normocytic anemia 05/23/2017  . 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 Presbycusis of both ears 02/15/2017   Past Surgical History:  Procedure Laterality Date  . ABDOMINAL HYSTERECTOMY  11/06/2006  . CESAREAN SECTION     x3  . COLONOSCOPY WITH PROPOFOL N/A 06/25/2018   Procedure: COLONOSCOPY WITH PROPOFOL;  Surgeon: Jonathon Bellows, MD;  Location: [facility] ENDOSCOPY;  Service: Endoscopy;  Laterality: N/A;  . KYPHOPLASTY    . KYPHOPLASTY N/A 09/23/2018   Procedure: KYPHOPLASTY T10;  Surgeon: Hessie Knows, MD;  Location: [facility] ORS;  Service: Orthopedics;  Laterality: N/A;  . KYPHOPLASTY N/A 03/11/2020   Procedure: L3 KYPHOPLASTY;  Surgeon: Hessie Knows, MD;  Location: [facility] ORS;  Service: Orthopedics;  Laterality: N/A;  . ORIF HUMERUS FRACTURE Left 08/07/2019   Procedure: OPEN REDUCTION INTERNAL FIXATION (ORIF) LEFT DISTAL HUMERUS FRACTURE;  Surgeon: Altamese Sheridan Lake, MD;  Location: [place];  Service: Orthopedics;  Laterality: Left;  Marland Kitchen VENTRAL HERNIA REPAIR N/A 06/02/2017   Procedure: exploratory laparotomy repair of incarcerated  VENTRAL hernia;  Surgeon: Florene Glen, MD;  Location: [facility] ORS;  Service: General;  Laterality: N/A;  . VENTRAL HERNIA REPAIR N/A 08/29/2018   Procedure: LAPAROSCOPIC INCISIONAL HERNIA;  Surgeon: Olean Ree, MD;  Location:  [facility] ORS;  Service: General;  Laterality: N/A;   Social History   Socioeconomic History  . Marital status: Married    Spouse name: Annitta Fifield  . Number of children: Not on file  . Years of education: [employer]  . Highest education level: Not on file  Occupational History  . Occupation: retired  Tobacco Use  . Smoking status: Never Smoker  . Smokeless tobacco: Never Used  Vaping Use  . Vaping Use: Never used  Substance and Sexual Activity  . Alcohol use: No  . Drug use: No  . Sexual activity: Not on file  Other Topics Concern  . Not on file  Social History Narrative  . Not on file   Social Determinants of Health   Financial Resource Strain:   . Difficulty of Paying Living Expenses: Not on file  Food Insecurity:   . Worried About Charity fundraiser in the Last Year: Not on file  . Ran Out of Food in the Last Year: Not on file  Transportation Needs:   . Lack of Transportation (Medical): Not on file  . Lack of Transportation (Non-Medical): Not on file  Physical Activity:   . Days of Exercise per Week: Not on file  . Minutes of Exercise per Session: Not on file  Stress:   . Feeling of Stress : Not on file  Social Connections:   . Frequency of Communication with Friends and Family: Not on file  . Frequency of Social Gatherings with Friends and Family: Not on file  . Attends Religious Services: Not on file  . Active Member of Clubs or Organizations: Not on file  . Attends Archivist Meetings: Not on file  . Marital Status: Not on file  Intimate Partner Violence:   . Fear of Current or Ex-Partner: Not on file  . Emotionally Abused: Not on file  . Physically Abused: Not on file  . Sexually Abused: Not on file   Family History  Problem Relation Age of Onset  . Cancer Mother        cancer  . Breast cancer Mother 61   Current Outpatient Medications on File Prior to Visit  Medication Sig  . buPROPion (WELLBUTRIN XL) 150 MG 24 hr tablet Take 150 mg by  mouth in the morning and at bedtime.   . busPIRone (BUSPAR) 10 MG tablet Take 10 mg by mouth every morning. Prescribed by Psychiatry Dr Kasandra Knudsen  . cholecalciferol (VITAMIN D) 1000 units tablet Take 1,000 Units by mouth daily.  . Ferrous Sulfate (IRON PO) Take 1 tablet by mouth daily.  . Multiple Vitamin (MULTI-VITAMINS) TABS Take 1 tablet by mouth daily.   Marland Kitchen OLANZapine (ZYPREXA) 5 MG tablet Take 5 mg by mouth at bedtime.   Marland Kitchen OLANZapine (ZYPREXA) 7.5 MG tablet Take 7.5 mg by mouth every morning.   Marland Kitchen omeprazole (PRILOSEC) 20 MG capsule TAKE 1 CAPSULE (20 MG TOTAL) BY MOUTH  DAILY BEFORE BREAKFAST.  Marland Kitchen ondansetron (ZOFRAN-ODT) 4 MG disintegrating tablet TAKE 1 TABLET BY MOUTH EVERY 8 HOURS AS NEEDED FOR NAUSEA AND VOMITING (Patient taking differently: Take 4 mg by mouth every 8 (eight) hours as needed for nausea or vomiting. )  . PROAIR HFA 108 (90 Base) MCG/ACT inhaler INHALE 2 PUFFS INTO THE LUNGS EVERY 4 HOURS AS NEEDED FOR WHEEZING OR SHORTNESS OF BREATH (COUGH) (Patient taking differently: Inhale 2 puffs into the lungs every 4 (four) hours as needed for wheezing or shortness of breath. )  . simvastatin (ZOCOR) 20 MG tablet Take 1 tablet (20 mg total) by mouth every morning.  . venlafaxine XR (EFFEXOR-XR) 75 MG 24 hr capsule Take 75 mg by mouth daily with breakfast.   . vitamin A 10000 UNIT capsule Take 10,000 Units by mouth daily.  . vitamin C (ASCORBIC ACID) 500 MG tablet Take 500 mg by mouth daily.   No current facility-administered medications on file prior to visit.    Review of Systems Per HPI unless specifically indicated above      Objective:    BP (!) 152/64 (BP Location: Left Arm, Cuff Size: Normal)   Pulse 88   Temp 97.8 F (36.6 C) (Temporal)   Resp 16   Ht 4\' 10"  (1.473 m)   Wt 119 lb 9.6 oz (54.3 kg)   SpO2 95%   BMI 25.00 kg/m   Wt Readings from Last 3 Encounters:  06/28/20 119 lb 9.6 oz (54.3 kg)  06/01/20 118 lb (53.5 kg)  05/29/20 117 lb (53.1 kg)    Physical  Exam Vitals and nursing note reviewed.  Constitutional:      General: She is not in acute distress.    Appearance: She is well-developed. She is not diaphoretic.     Comments: Well-appearing, comfortable, cooperative  HENT:     Head: Normocephalic and atraumatic.  Eyes:     General:        Right eye: No discharge.        Left eye: No discharge.     Conjunctiva/sclera: Conjunctivae normal.     Pupils: Pupils are equal, round, and reactive to light.  Neck:     Thyroid: No thyromegaly.  Cardiovascular:     Rate and Rhythm: Normal rate and regular rhythm.     Heart sounds: Normal heart sounds. No murmur heard.   Pulmonary:     Effort: Pulmonary effort is normal. No respiratory distress.     Breath sounds: Normal breath sounds. No wheezing or rales.  Abdominal:     General: Bowel sounds are normal. There is no distension.     Palpations: Abdomen is soft. There is no mass.     Tenderness: There is no abdominal tenderness.  Musculoskeletal:        General: No tenderness. Normal range of motion.     Cervical back: Normal range of motion and neck supple.     Right lower leg: No edema.     Left lower leg: No edema.     Comments: Upper / Lower Extremities: - Normal muscle tone, strength bilateral upper extremities 5/5, lower extremities 5/5  Lymphadenopathy:     Cervical: No cervical adenopathy.  Skin:    General: Skin is warm and dry.     Findings: No erythema or rash.  Neurological:     Mental Status: She is alert and oriented to person, place, and time.     Comments: Distal sensation intact to light touch all extremities  Psychiatric:        Behavior: Behavior normal.     Comments: Well groomed, good eye contact, normal speech and thoughts        Results for orders placed or performed during the hospital encounter of 05/29/20  Comprehensive metabolic panel  Result Value Ref Range   Sodium 141 135 - 145 mmol/L   Potassium 3.7 3.5 - 5.1 mmol/L   Chloride 102 98 - 111  mmol/L   CO2 26 22 - 32 mmol/L   Glucose, Bld 96 70 - 99 mg/dL   BUN 17 8 - 23 mg/dL   Creatinine, Ser 0.81 0.44 - 1.00 mg/dL   Calcium 9.2 8.9 - 10.3 mg/dL   Total Protein 6.8 6.5 - 8.1 g/dL   Albumin 4.0 3.5 - 5.0 g/dL   AST 21 15 - 41 U/L   ALT 14 0 - 44 U/L   Alkaline Phosphatase 109 38 - 126 U/L   Total Bilirubin 0.8 0.3 - 1.2 mg/dL   GFR calc non Af Amer >60 >60 mL/min   GFR calc Af Amer >60 >60 mL/min   Anion gap 13 5 - 15  CBC  Result Value Ref Range   WBC 5.6 4.0 - 10.5 K/uL   RBC 4.23 3.87 - 5.11 MIL/uL   Hemoglobin 14.2 12.0 - 15.0 g/dL   HCT 43.2 36 - 46 %   MCV 102.1 (H) 80.0 - 100.0 fL   MCH 33.6 26.0 - 34.0 pg   MCHC 32.9 30.0 - 36.0 g/dL   RDW 13.4 11.5 - 15.5 %   Platelets 218 150 - 400 K/uL   nRBC 0.0 0.0 - 0.2 %  Urinalysis, Complete w Microscopic  Result Value Ref Range   Color, Urine YELLOW (A) YELLOW   APPearance HAZY (A) CLEAR   Specific Gravity, Urine 1.018 1.005 - 1.030   pH 5.0 5.0 - 8.0   Glucose, UA NEGATIVE NEGATIVE mg/dL   Hgb urine dipstick NEGATIVE NEGATIVE   Bilirubin Urine NEGATIVE NEGATIVE   Ketones, ur 20 (A) NEGATIVE mg/dL   Protein, ur NEGATIVE NEGATIVE mg/dL   Nitrite NEGATIVE NEGATIVE   Leukocytes,Ua TRACE (A) NEGATIVE   RBC / HPF 0-5 0 - 5 RBC/hpf   WBC, UA 0-5 0 - 5 WBC/hpf   Bacteria, UA NONE SEEN NONE SEEN   Squamous Epithelial / LPF 0-5 0 - 5   Mucus PRESENT   Troponin I (High Sensitivity)  Result Value Ref Range   Troponin I (High Sensitivity) 9 <18 ng/L      Assessment & Plan:   Problem List Items Addressed This Visit    Normocytic anemia   Hyperlipidemia    Previously controlled cholesterol on statin and lifestyle Due lipids today  Plan: 1. Continue current meds - Simvastatin 20mg  daily 2. Encourage improved lifestyle - low carb/cholesterol, reduce portion size, continue improving regular exercise      Relevant Medications   amLODipine (NORVASC) 10 MG tablet   Essential hypertension    Previously  elevated BP, off medication metoprolol - Home BP readings none  No known complications    Plan:  1. NEW START Amlodipine 10mg  daily 2. Encourage improved lifestyle - low sodium diet, regular exercise 3. Start monitor BP outside office, bring readings to next visit, if persistently >140/90 or new symptoms notify office sooner 4. Follow-up 3 months       Relevant Medications   amLODipine (NORVASC) 10 MG tablet   Centrilobular emphysema (HCC)    Stable chronic  COPD without flare Second hand smoke, never smoker Not followed by Pulm. No prior PFTs/spirometry - Off Advair maintenance  Plan: 1. May resume maintenance in future 2. Encouraged keep up regular walking regimen to improve lung function 3. May use albuterol but only PRN - notify office if using more frequently or flare 4. Follow-up as needed      Bipolar 1 disorder, depressed, full remission ([place])    Stable, controlled on current regimen, chronic problem multiple psych dx, primarily bipolar depression with mixed anxiety. Followed by Psychiatry now, Dr Kasandra Knudsen [facility]), currently receiving rx and managing  Plan: 1. Follow-up as scheduled with Psychiatry On medications current regimen.       Other Visit Diagnoses    Annual physical exam    -  Primary     Updated Health Maintenance information Due for fasting labs (had OJ) today, will f/u results Encouraged improvement to lifestyle with diet and exercise     Meds ordered this encounter  Medications  . amLODipine (NORVASC) 10 MG tablet    Sig: Take 1 tablet (10 mg total) by mouth daily.    Dispense:  90 tablet    Refill:  3     Follow up plan: Return in about 3 months (around 09/28/2020) for 3 month HTN, mood PHQ9.  [name], [facility] 06/28/2020, 9:07 AM

## 2020-06-29 LAB — CBC WITH DIFFERENTIAL/PLATELET
Absolute Monocytes: 540 cells/uL (ref 200–950)
Basophils Absolute: 28 cells/uL (ref 0–200)
Basophils Relative: 0.7 %
Eosinophils Absolute: 108 cells/uL (ref 15–500)
Eosinophils Relative: 2.7 %
HCT: 45.4 % — ABNORMAL HIGH (ref 35.0–45.0)
Hemoglobin: 15 g/dL (ref 11.7–15.5)
Lymphs Abs: 1008 cells/uL (ref 850–3900)
MCH: 33.8 pg — ABNORMAL HIGH (ref 27.0–33.0)
MCHC: 33 g/dL (ref 32.0–36.0)
MCV: 102.3 fL — ABNORMAL HIGH (ref 80.0–100.0)
MPV: 10.1 fL (ref 7.5–12.5)
Monocytes Relative: 13.5 %
Neutro Abs: 2316 cells/uL (ref 1500–7800)
Neutrophils Relative %: 57.9 %
Platelets: 241 10*3/uL (ref 140–400)
RBC: 4.44 10*6/uL (ref 3.80–5.10)
RDW: 11.7 % (ref 11.0–15.0)
Total Lymphocyte: 25.2 %
WBC: 4 10*3/uL (ref 3.8–10.8)

## 2020-06-29 LAB — COMPLETE METABOLIC PANEL WITH GFR
AG Ratio: 1.6 (calc) (ref 1.0–2.5)
ALT: 14 U/L (ref 6–29)
AST: 22 U/L (ref 10–35)
Albumin: 3.9 g/dL (ref 3.6–5.1)
Alkaline phosphatase (APISO): 134 U/L (ref 37–153)
BUN/Creatinine Ratio: 17 (calc) (ref 6–22)
BUN: 10 mg/dL (ref 7–25)
CO2: 27 mmol/L (ref 20–32)
Calcium: 9.7 mg/dL (ref 8.6–10.4)
Chloride: 102 mmol/L (ref 98–110)
Creat: 0.59 mg/dL — ABNORMAL LOW (ref 0.60–0.93)
GFR, Est African American: 105 mL/min/{1.73_m2} (ref >=60)
GFR, Est Non African American: 90 mL/min/{1.73_m2} (ref >=60)
Globulin: 2.5 g/dL (calc) (ref 1.9–3.7)
Glucose, Bld: 103 mg/dL — ABNORMAL HIGH (ref 65–99)
Potassium: 4.4 mmol/L (ref 3.5–5.3)
Sodium: 139 mmol/L (ref 135–146)
Total Bilirubin: 0.4 mg/dL (ref 0.2–1.2)
Total Protein: 6.4 g/dL (ref 6.1–8.1)

## 2020-06-29 LAB — HEMOGLOBIN A1C
Hgb A1c MFr Bld: 5.1 % of total Hgb (ref <5.7)
Mean Plasma Glucose: 100 (calc)
eAG (mmol/L): 5.5 (calc)

## 2020-06-29 LAB — LIPID PANEL
Cholesterol: 215 mg/dL — ABNORMAL HIGH (ref <200)
HDL: 77 mg/dL (ref >=50)
LDL Cholesterol (Calc): 114 mg/dL (calc) — ABNORMAL HIGH
Non-HDL Cholesterol (Calc): 138 mg/dL (calc) — ABNORMAL HIGH (ref <130)
Total CHOL/HDL Ratio: 2.8 (calc) (ref <5.0)
Triglycerides: 125 mg/dL (ref <150)

## 2020-06-29 LAB — TSH: TSH: 2.66 mIU/L (ref 0.40–4.50)

## 2020-07-27 ENCOUNTER — Other Ambulatory Visit: Payer: Self-pay

## 2020-07-27 ENCOUNTER — Encounter: Payer: Self-pay | Admitting: Family Medicine

## 2020-07-27 ENCOUNTER — Ambulatory Visit (INDEPENDENT_AMBULATORY_CARE_PROVIDER_SITE_OTHER): Payer: Medicare Other | Admitting: Family Medicine

## 2020-07-27 VITALS — BP 138/68 | HR 94 | Temp 97.6°F | Resp 16 | Ht <= 58 in | Wt 123.0 lb

## 2020-07-27 DIAGNOSIS — N3001 Acute cystitis with hematuria: Secondary | ICD-10-CM | POA: Diagnosis not present

## 2020-07-27 DIAGNOSIS — N390 Urinary tract infection, site not specified: Secondary | ICD-10-CM | POA: Diagnosis not present

## 2020-07-27 DIAGNOSIS — B379 Candidiasis, unspecified: Secondary | ICD-10-CM | POA: Diagnosis not present

## 2020-07-27 DIAGNOSIS — M1712 Unilateral primary osteoarthritis, left knee: Secondary | ICD-10-CM | POA: Diagnosis not present

## 2020-07-27 DIAGNOSIS — M19072 Primary osteoarthritis, left ankle and foot: Secondary | ICD-10-CM | POA: Diagnosis not present

## 2020-07-27 DIAGNOSIS — T3695XA Adverse effect of unspecified systemic antibiotic, initial encounter: Secondary | ICD-10-CM

## 2020-07-27 LAB — POCT URINALYSIS DIPSTICK
Bilirubin, UA: NEGATIVE
Glucose, UA: NEGATIVE
Ketones, UA: NEGATIVE
Nitrite, UA: NEGATIVE
Protein, UA: POSITIVE — AB
Spec Grav, UA: 1.025 (ref 1.010–1.025)
Urobilinogen, UA: 0.2 E.U./dL
pH, UA: 5 (ref 5.0–8.0)

## 2020-07-27 MED ORDER — FLUCONAZOLE 150 MG PO TABS: ORAL_TABLET | ORAL | 0 refills | Status: DC

## 2020-07-27 MED ORDER — CEPHALEXIN 500 MG PO CAPS: 500.0000 mg | ORAL_CAPSULE | Freq: Three times a day (TID) | ORAL | 0 refills | Status: DC

## 2020-07-27 NOTE — Progress Notes (Signed)
Subjective:    Patient ID: Pamela Ball, female    DOB: March 05, 1946, 74 y.o.   MRN: [id number]  Pamela Ball is a 74 y.o. female presenting on 07/27/2020 for Urinary Tract Infection (onset 3 days urgency, frequency, burning )   HPI   UTI Reports symptoms started 3 days ago with dysuria with urination, increased urinary frequency, admits feeling a little sick on stomach at times and low grade temp. No recent UTI. In past has resolved w/ Keflex in 2020 on chart. No Allergies. 2nd dose COVID vaccine today Moderna (4 weeks later) Denies any chills, hematuria, back pain, nausea vomiting  Depression screen [facility] 2/9 06/28/2020 06/01/2020 10/22/2019  Decreased Interest 0 0 3  Down, Depressed, Hopeless 0 0 3  PHQ - 2 Score 0 0 6  Altered sleeping 2 1 3   Tired, decreased energy 3 2 2   Change in appetite 0 0 2  Feeling bad or failure about yourself  0 0 0  Trouble concentrating 0 2 0  Moving slowly or fidgety/restless 0 1 1  Suicidal thoughts 0 0 0  PHQ-9 Score 5 6 14   Difficult doing work/chores Not difficult at all Not difficult at all Not difficult at all  Some recent data might be hidden    Social History   Tobacco Use  . Smoking status: Never Smoker  . Smokeless tobacco: Never Used  Vaping Use  . Vaping Use: Never used  Substance Use Topics  . Alcohol use: No  . Drug use: No    Review of Systems Per HPI unless specifically indicated above     Objective:    BP 138/68 (BP Location: Left Arm, Cuff Size: Normal)   Pulse 94   Temp 97.6 F (36.4 C)   Resp 16   Ht 4\' 10"  (1.473 m)   Wt 123 lb (55.8 kg)   SpO2 94%   BMI 25.71 kg/m   Wt Readings from Last 3 Encounters:  07/27/20 123 lb (55.8 kg)  06/28/20 119 lb 9.6 oz (54.3 kg)  06/01/20 118 lb (53.5 kg)    Physical Exam Vitals and nursing note reviewed.  Constitutional:      General: She is not in acute distress.    Appearance: She is well-developed. She is not diaphoretic.     Comments: Well-appearing,  comfortable, cooperative  HENT:     Head: Normocephalic and atraumatic.  Eyes:     General:        Right eye: No discharge.        Left eye: No discharge.     Conjunctiva/sclera: Conjunctivae normal.  Cardiovascular:     Rate and Rhythm: Normal rate.  Pulmonary:     Effort: Pulmonary effort is normal.  Skin:    General: Skin is warm and dry.     Findings: No erythema or rash.  Neurological:     Mental Status: She is alert and oriented to person, place, and time.  Psychiatric:        Behavior: Behavior normal.     Comments: Well groomed, good eye contact, normal speech and thoughts    Results for orders placed or performed in visit on 07/27/20  POCT Urinalysis Dipstick  Result Value Ref Range   Color, UA dark yellow    Clarity, UA cloudy    Glucose, UA Negative Negative   Bilirubin, UA neg    Ketones, UA neg    Spec Grav, UA 1.025 1.010 - 1.025   Blood, UA large  pH, UA 5.0 5.0 - 8.0   Protein, UA Positive (A) Negative   Urobilinogen, UA 0.2 0.2 or 1.0 E.U./dL   Nitrite, UA neg    Leukocytes, UA Large (3+) (A) Negative   Appearance cloudy    Odor foul odor       Assessment & Plan:   Problem List Items Addressed This Visit    None    Visit Diagnoses    Acute cystitis with hematuria    -  Primary   Relevant Medications   cephALEXin (KEFLEX) 500 MG capsule   Other Relevant Orders   POCT Urinalysis Dipstick (Completed)   Urine Culture   Antibiotic-induced yeast infection       Relevant Medications   cephALEXin (KEFLEX) 500 MG capsule   fluconazole (DIFLUCAN) 150 MG tablet      Clinically consistent with UTI and confirmed on UA. No recent UTIs or abx courses.   No concern for pyelo today (no systemic symptoms, neg fever, back pain, n/v).  Plan: 1. UA dipstick suggestive of UTI today - Last urine checked did not have microscopic hematuria 05/2020 2. Ordered Urine culture 3. Keflex 500mg  TID x 7 days - Add diflucan if antibiotic induced yeast infection 4.  Improve PO hydration 5. RTC if no improvement 1-2 weeks, red flags given to return sooner   Meds ordered this encounter  Medications  . cephALEXin (KEFLEX) 500 MG capsule    Sig: Take 1 capsule (500 mg total) by mouth 3 (three) times daily. For 7 days    Dispense:  21 capsule    Refill:  0  . fluconazole (DIFLUCAN) 150 MG tablet    Sig: Take one tablet by mouth on Day 1. Repeat dose 2nd tablet on Day 3.    Dispense:  2 tablet    Refill:  0      Follow up plan: Return in about 2 weeks (around 08/10/2020), or if symptoms worsen or fail to improve, for UTI.   [name], [facility] 07/27/2020, 3:01 PM

## 2020-07-27 NOTE — Patient Instructions (Addendum)
Thank you for coming to the office today.  1. You have a Urinary Tract Infection - this is very common, your symptoms are reassuring and you should get better within 1 week on the antibiotics - Start Keflex 500mg  3 times daily for next 7 days, complete entire course, even if feeling better - We sent urine for a culture, we will call you within next few days if we need to change antibiotics - Please drink plenty of fluids, improve hydration over next 1 week  AFTER Keflex antibiotic you may take the Diflucan (anti yeast pill) take 1 then skip 1 day then take 2nd pill  If symptoms worsening, developing nausea / vomiting, worsening back pain, fevers / chills / sweats, then please return for re-evaluation sooner.  To prevent bladder and kidney infections...  Wipe front to back after using the restroom  Drink enough water to keep your pee clear to pale yellow  If you think you are getting another bladder infection, start drinking cranberry juice and come see [place] so we can check the urine.   Please schedule a Follow-up Appointment to: Return in about 2 weeks (around 08/10/2020), or if symptoms worsen or fail to improve, for UTI.  If you have any other questions or concerns, please feel free to call the office or send a message through Center. You may also schedule an earlier appointment if necessary.  Additionally, you may be receiving a survey about your experience at our office within a few days to 1 week by e-mail or mail. We value your feedback.  [name], DO [place]

## 2020-07-30 LAB — URINE CULTURE
MICRO NUMBER:: 10976516
SPECIMEN QUALITY:: ADEQUATE

## 2020-08-06 ENCOUNTER — Ambulatory Visit (INDEPENDENT_AMBULATORY_CARE_PROVIDER_SITE_OTHER): Payer: Medicare Other | Admitting: Family Medicine

## 2020-08-06 ENCOUNTER — Encounter: Payer: Self-pay | Admitting: Family Medicine

## 2020-08-06 ENCOUNTER — Other Ambulatory Visit: Payer: Self-pay

## 2020-08-06 VITALS — BP 177/55 | HR 94 | Temp 98.8°F | Resp 20 | Ht <= 58 in | Wt 121.6 lb

## 2020-08-06 DIAGNOSIS — R0781 Pleurodynia: Secondary | ICD-10-CM | POA: Diagnosis not present

## 2020-08-06 MED ORDER — IBUPROFEN 600 MG PO TABS: 600.0000 mg | ORAL_TABLET | Freq: Three times a day (TID) | ORAL | 0 refills | Status: DC | PRN

## 2020-08-06 NOTE — Assessment & Plan Note (Addendum)
Right sided rib pain, fracture vs bruised bone.  Patient declined chest xray offered.  Lungs are CTA in all lobes, no concerns for pneumothorax.  Pain with palpation right rib #7.  Oxygen level is stable compared to last few visits within office.  Will start on ibuprofen and discussed importance of deep breathing, as it may be uncomfortable, but it is important to keep the lungs healthy.  Strict return vs urgent care instructions provided.  Plan: 1. Begin ibuprofen 600mg  every 8 hours as needed for pain 2. Work on deep breathing exercises throughout the day 3. If having any worsening of symptoms, sudden shortness of breath, DOE, or chest pain to proceed to the emergency department for evaluation 4. RTC if symptoms worsen or fail to improve

## 2020-08-06 NOTE — Patient Instructions (Signed)
I have sent in a prescription for ibuprofen to take 1 tablet every 8 hours as needed for right rib pain.  It is important for you to continue to take deep breaths to ensure the lungs stay healthy.  If you start to have any worsening of symptoms, it is important that you seek urgent/emergent care at either the emergency department or the urgent care.  We will plan to see you back if your symptoms worsen or fail to improve  You will receive a survey after today's visit either digitally by e-mail or paper by USPS mail. Your experiences and feedback matter to [place].  Please respond so we know how we are doing as we provide care for you.  Call us with any questions/concerns/needs.  It is my goal to be available to you for your health concerns.  Thanks for choosing me to be a partner in your healthcare needs!  Harlin Rain, FNP-C Family Nurse Practitioner [facility] Phone: [phone]

## 2020-08-06 NOTE — Progress Notes (Signed)
Subjective:    Patient ID: Pamela Ball, female    DOB: 11/24/1945, 74 y.o.   MRN: [id number]  Pamela ABRUZZESE is a 74 y.o. female presenting on 08/06/2020 for Rib Injury (pt fell walking up a ramp x 2 days ago. She state she slipped and fell and  injured Right side of rib cage. She complains of pain when she take a deep breath, walk and lying. )   HPI  Ms. Fidler presents to clinic for concerns of right rib injury with discomfort.  Reports she was walking up a ramp 2 days ago, she slipped and fell onto the right side of her rib cage.  Has been having slightly increased pain with deep breathing, walking or lying down.  Has not taken anything for her symptoms, denies SOB, DOE, cough, or chest pain.  Depression screen [facility] 2/9 06/28/2020 06/01/2020 10/22/2019  Decreased Interest 0 0 3  Down, Depressed, Hopeless 0 0 3  PHQ - 2 Score 0 0 6  Altered sleeping 2 1 3   Tired, decreased energy 3 2 2   Change in appetite 0 0 2  Feeling bad or failure about yourself  0 0 0  Trouble concentrating 0 2 0  Moving slowly or fidgety/restless 0 1 1  Suicidal thoughts 0 0 0  PHQ-9 Score 5 6 14   Difficult doing work/chores Not difficult at all Not difficult at all Not difficult at all  Some recent data might be hidden    Social History   Tobacco Use  . Smoking status: Never Smoker  . Smokeless tobacco: Never Used  Vaping Use  . Vaping Use: Never used  Substance Use Topics  . Alcohol use: No  . Drug use: No    Review of Systems  Constitutional: Negative.   HENT: Negative.   Eyes: Negative.   Respiratory: Negative.   Cardiovascular: Negative.   Gastrointestinal: Negative.   Endocrine: Negative.   Genitourinary: Negative.   Musculoskeletal: Positive for arthralgias and myalgias. Negative for back pain, gait problem, joint swelling, neck pain and neck stiffness.  Skin: Negative.   Allergic/Immunologic: Negative.   Neurological: Negative.   Hematological: Negative.     Psychiatric/Behavioral: Negative.    Per HPI unless specifically indicated above     Objective:    BP (!) 177/55 (BP Location: Right Arm, Patient Position: Sitting, Cuff Size: Normal)   Pulse 94   Temp 98.8 F (37.1 C) (Oral)   Resp 20   Ht 4\' 10"  (1.473 m)   Wt 121 lb 9.6 oz (55.2 kg)   SpO2 95%   BMI 25.41 kg/m   Wt Readings from Last 3 Encounters:  08/06/20 121 lb 9.6 oz (55.2 kg)  07/27/20 123 lb (55.8 kg)  06/28/20 119 lb 9.6 oz (54.3 kg)    Physical Exam Vitals and nursing note reviewed.  Constitutional:      General: She is not in acute distress.    Appearance: Normal appearance. She is well-developed, well-groomed and overweight. She is not ill-appearing or toxic-appearing.  HENT:     Head: Normocephalic and atraumatic.     Nose:     Comments: Pamela Ball is in place, covering mouth and nose. Eyes:     General: Lids are normal. Vision grossly intact.        Right eye: No discharge.        Left eye: No discharge.     Extraocular Movements: Extraocular movements intact.     Conjunctiva/sclera: Conjunctivae normal.  Pupils: Pupils are equal, round, and reactive to light.  Cardiovascular:     Rate and Rhythm: Normal rate and regular rhythm.     Pulses: Normal pulses.          Dorsalis pedis pulses are 2+ on the right side and 2+ on the left side.     Heart sounds: Normal heart sounds. No murmur heard.  No friction rub. No gallop.   Pulmonary:     Effort: Pulmonary effort is normal. No respiratory distress.     Breath sounds: Normal breath sounds.     Comments: Lungs CTA with good air movement Chest:    Musculoskeletal:     Right lower leg: No edema.     Left lower leg: No edema.  Skin:    General: Skin is warm and dry.     Capillary Refill: Capillary refill takes less than 2 seconds.  Neurological:     General: No focal deficit present.     Mental Status: She is alert and oriented to person, place, and time.  Psychiatric:        Attention and  Perception: Attention and perception normal.        Mood and Affect: Mood and affect normal.        Speech: Speech normal.        Behavior: Behavior normal. Behavior is cooperative.        Thought Content: Thought content normal.        Cognition and Memory: Cognition and memory normal.        Judgment: Judgment normal.    Results for orders placed or performed in visit on 07/27/20  Urine Culture   Specimen: Urine  Result Value Ref Range   MICRO NUMBER: [id number]    SPECIMEN QUALITY: Adequate    Sample Source URINE    STATUS: FINAL    ISOLATE 1: Escherichia coli (A)       Susceptibility   Escherichia coli - URINE CULTURE, REFLEX    AMOX/CLAVULANIC >=32 Resistant     AMPICILLIN >=32 Resistant     AMPICILLIN/SULBACTAM >=32 Resistant     CEFAZOLIN* <=4 Not Reportable      * For infections other than uncomplicated UTIcaused by E. coli, K. pneumoniae or P. mirabilis:Cefazolin is resistant if MIC > or = 8 mcg/mL.(Distinguishing susceptible versus intermediatefor isolates with MIC < or = 4 mcg/mL requiresadditional testing.)For uncomplicated UTI caused by E. coli,K. pneumoniae or P. mirabilis: Cefazolin issusceptible if MIC <32 mcg/mL and predictssusceptible to the oral agents cefaclor, cefdinir,cefpodoxime, cefprozil, cefuroxime, cephalexinand loracarbef.    CEFEPIME <=1 Sensitive     CEFTRIAXONE <=1 Sensitive     CIPROFLOXACIN <=0.25 Sensitive     LEVOFLOXACIN <=0.12 Sensitive     ERTAPENEM <=0.5 Sensitive     GENTAMICIN <=1 Sensitive     IMIPENEM <=0.25 Sensitive     NITROFURANTOIN <=16 Sensitive     PIP/TAZO <=4 Sensitive     TOBRAMYCIN <=1 Sensitive     TRIMETH/SULFA* <=20 Sensitive      * For infections other than uncomplicated UTIcaused by E. coli, K. pneumoniae or P. mirabilis:Cefazolin is resistant if MIC > or = 8 mcg/mL.(Distinguishing susceptible versus intermediatefor isolates with MIC < or = 4 mcg/mL requiresadditional testing.)For uncomplicated UTI caused by E. coli,K.  pneumoniae or P. mirabilis: Cefazolin issusceptible if MIC <32 mcg/mL and predictssusceptible to the oral agents cefaclor, cefdinir,cefpodoxime, cefprozil, cefuroxime, cephalexinand loracarbef.Legend:S = Susceptible  I = IntermediateR = Resistant  NS = Not susceptible* =  Not tested  NR = Not reported**NN = See antimicrobic comments  POCT Urinalysis Dipstick  Result Value Ref Range   Color, UA dark yellow    Clarity, UA cloudy    Glucose, UA Negative Negative   Bilirubin, UA neg    Ketones, UA neg    Spec Grav, UA 1.025 1.010 - 1.025   Blood, UA large    pH, UA 5.0 5.0 - 8.0   Protein, UA Positive (A) Negative   Urobilinogen, UA 0.2 0.2 or 1.0 E.U./dL   Nitrite, UA neg    Leukocytes, UA Large (3+) (A) Negative   Appearance cloudy    Odor foul odor       Assessment & Plan:   Problem List Items Addressed This Visit      Other   Rib pain on right side - Primary    Right sided rib pain, fracture vs bruised bone.  Patient declined chest xray offered.  Lungs are CTA in all lobes, no concerns for pneumothorax.  Pain with palpation right rib #7.  Oxygen level is stable compared to last few visits within office.  Will start on ibuprofen and discussed importance of deep breathing, as it may be uncomfortable, but it is important to keep the lungs healthy.  Strict return vs urgent care instructions provided.  Plan: 1. Begin ibuprofen 600mg  every 8 hours as needed for pain 2. Work on deep breathing exercises throughout the day 3. If having any worsening of symptoms, sudden shortness of breath, DOE, or chest pain to proceed to the emergency department for evaluation 4. RTC if symptoms worsen or fail to improve      Relevant Medications   ibuprofen (ADVIL) 600 MG tablet      Meds ordered this encounter  Medications  . ibuprofen (ADVIL) 600 MG tablet    Sig: Take 1 tablet (600 mg total) by mouth every 8 (eight) hours as needed.    Dispense:  30 tablet    Refill:  0    Follow up  plan: Return if symptoms worsen or fail to improve.   Harlin Rain, [facility] Practition[facility] 08/06/2020, 10:00 AM

## 2020-09-20 ENCOUNTER — Other Ambulatory Visit: Payer: Self-pay | Admitting: Family Medicine

## 2020-09-20 DIAGNOSIS — R110 Nausea: Secondary | ICD-10-CM

## 2020-09-20 NOTE — Telephone Encounter (Signed)
Requested medication (s) are due for refill today: yes  Requested medication (s) are on the active medication list: yes  Last refill:  01/01/20 #30 with 2 refills  Future visit scheduled: yes  Notes to clinic:  Please review for refill. Refill not delegated per protocol    Requested Prescriptions  Pending Prescriptions Disp Refills   ondansetron (ZOFRAN-ODT) 4 MG disintegrating tablet [Pharmacy Med Name: ONDANSETRON ODT 4 MG TABLET] 30 tablet 2    Sig: TAKE 1 TABLET BY MOUTH EVERY 8 HOURS AS NEEDED FOR NAUSEA AND VOMITING      Not Delegated - Gastroenterology: Antiemetics Failed - 09/20/2020 10:03 AM      Failed - This refill cannot be delegated      Passed - Valid encounter within last 6 months    Recent Outpatient Visits           1 month ago Rib pain on right side   [facility], Lupita Raider, FNP   1 month ago Acute cystitis with hematuria   [place], DO   2 months ago Annual physical exam   [place], DO   3 months ago Bipolar 1 disorder, depressed, full remission [facility])   [facility] Olin Hauser, DO   11 months ago Centrilobular emphysema [facility])   [place], Devonne Doughty, DO

## 2020-10-07 ENCOUNTER — Other Ambulatory Visit: Payer: Self-pay | Admitting: Family Medicine

## 2020-10-07 DIAGNOSIS — E782 Mixed hyperlipidemia: Secondary | ICD-10-CM

## 2020-10-25 ENCOUNTER — Other Ambulatory Visit: Payer: Self-pay | Admitting: Family Medicine

## 2020-10-25 DIAGNOSIS — R0781 Pleurodynia: Secondary | ICD-10-CM

## 2020-11-16 ENCOUNTER — Encounter: Payer: Self-pay | Admitting: Family Medicine

## 2020-11-16 ENCOUNTER — Other Ambulatory Visit: Payer: Self-pay

## 2020-11-16 ENCOUNTER — Ambulatory Visit (INDEPENDENT_AMBULATORY_CARE_PROVIDER_SITE_OTHER): Payer: Medicare Other | Admitting: Family Medicine

## 2020-11-16 VITALS — BP 164/65 | HR 89 | Temp 97.8°F | Resp 16 | Ht <= 58 in | Wt 123.0 lb

## 2020-11-16 DIAGNOSIS — Z23 Encounter for immunization: Secondary | ICD-10-CM | POA: Diagnosis not present

## 2020-11-16 DIAGNOSIS — N6310 Unspecified lump in the right breast, unspecified quadrant: Secondary | ICD-10-CM

## 2020-11-16 NOTE — Progress Notes (Signed)
Subjective:    Patient ID: Pamela Ball, female    DOB: 04-26-1946, 75 y.o.   MRN: [id number]  Pamela Ball is a 75 y.o. female presenting on 11/16/2020 for Referral (Mammogram ) and Breast Mass   HPI  Mass on Right Breast Last [facility] - 2015, see results below. She reports new finding of abnormal lump in Right breast onset about 3 weeks ago, upper inner aspect around 1 o clock, mild tender, area is about size of quarter, no similar spot in past. Last mammogram was normal. - Fam history breast cancer, mother age 89 - Denies any redness rash, fevers chills sweats, nipple discharge or dimpling or swelling or rash  Health Maintenance:  UTD COVID Vaccine, last dose 07/2020 Due for Flu Shot, declines today despite counseling on benefits   Depression screen [facility] 2/9 06/28/2020 06/01/2020 10/22/2019  Decreased Interest 0 0 3  Down, Depressed, Hopeless 0 0 3  PHQ - 2 Score 0 0 6  Altered sleeping 2 1 3   Tired, decreased energy 3 2 2   Change in appetite 0 0 2  Feeling bad or failure about yourself  0 0 0  Trouble concentrating 0 2 0  Moving slowly or fidgety/restless 0 1 1  Suicidal thoughts 0 0 0  PHQ-9 Score 5 6 14   Difficult doing work/chores Not difficult at all Not difficult at all Not difficult at all  Some recent data might be hidden    Social History   Tobacco Use  . Smoking status: Never Smoker  . Smokeless tobacco: Never Used  Vaping Use  . Vaping Use: Never used  Substance Use Topics  . Alcohol use: No  . Drug use: No    Review of Systems Per HPI unless specifically indicated above  Family History  Problem Relation Age of Onset  . Cancer Mother        cancer  . Breast cancer Mother 28        Objective:    BP (!) 164/65   Pulse 89   Temp 97.8 F (36.6 C) (Temporal)   Resp 16   Ht 4\' 10"  (1.473 m)   Wt 123 lb (55.8 kg)   SpO2 95%   BMI 25.71 kg/m   Wt Readings from Last 3 Encounters:  11/16/20 123 lb (55.8 kg)   08/06/20 121 lb 9.6 oz (55.2 kg)  07/27/20 123 lb (55.8 kg)    Physical Exam Vitals and nursing note reviewed.  Constitutional:      General: She is not in acute distress.    Appearance: She is well-developed and well-nourished. She is not diaphoretic.     Comments: Well-appearing, comfortable, cooperative  HENT:     Head: Normocephalic and atraumatic.     Mouth/Throat:     Mouth: Oropharynx is clear and moist.  Eyes:     General:        Right eye: No discharge.        Left eye: No discharge.     Conjunctiva/sclera: Conjunctivae normal.  Cardiovascular:     Rate and Rhythm: Normal rate.  Pulmonary:     Effort: Pulmonary effort is normal.  Musculoskeletal:        General: No edema.  Skin:    General: Skin is warm and dry.     Findings: No erythema or rash.     Comments: Declines Breast Exam today.  Neurological:     Mental Status: She is alert and oriented to  person, place, and time.  Psychiatric:        Mood and Affect: Mood and affect normal.        Behavior: Behavior normal.     Comments: Well groomed, good eye contact, normal speech and thoughts    Results for orders placed or performed in visit on 07/27/20  Urine Culture   Specimen: Urine  Result Value Ref Range   MICRO NUMBER: [id number]    SPECIMEN QUALITY: Adequate    Sample Source URINE    STATUS: FINAL    ISOLATE 1: Escherichia coli (A)       Susceptibility   Escherichia coli - URINE CULTURE, REFLEX    AMOX/CLAVULANIC >=32 Resistant     AMPICILLIN >=32 Resistant     AMPICILLIN/SULBACTAM >=32 Resistant     CEFAZOLIN* <=4 Not Reportable      * For infections other than uncomplicated UTIcaused by E. coli, K. pneumoniae or P. mirabilis:Cefazolin is resistant if MIC > or = 8 mcg/mL.(Distinguishing susceptible versus intermediatefor isolates with MIC < or = 4 mcg/mL requiresadditional testing.)For uncomplicated UTI caused by E. coli,K. pneumoniae or P. mirabilis: Cefazolin issusceptible if MIC <32 mcg/mL and  predictssusceptible to the oral agents cefaclor, cefdinir,cefpodoxime, cefprozil, cefuroxime, cephalexinand loracarbef.    CEFEPIME <=1 Sensitive     CEFTRIAXONE <=1 Sensitive     CIPROFLOXACIN <=0.25 Sensitive     LEVOFLOXACIN <=0.12 Sensitive     ERTAPENEM <=0.5 Sensitive     GENTAMICIN <=1 Sensitive     IMIPENEM <=0.25 Sensitive     NITROFURANTOIN <=16 Sensitive     PIP/TAZO <=4 Sensitive     TOBRAMYCIN <=1 Sensitive     TRIMETH/SULFA* <=20 Sensitive      * For infections other than uncomplicated UTIcaused by E. coli, K. pneumoniae or P. mirabilis:Cefazolin is resistant if MIC > or = 8 mcg/mL.(Distinguishing susceptible versus intermediatefor isolates with MIC < or = 4 mcg/mL requiresadditional testing.)For uncomplicated UTI caused by E. coli,K. pneumoniae or P. mirabilis: Cefazolin issusceptible if MIC <32 mcg/mL and predictssusceptible to the oral agents cefaclor, cefdinir,cefpodoxime, cefprozil, cefuroxime, cephalexinand loracarbef.Legend:S = Susceptible  I = IntermediateR = Resistant  NS = Not susceptible* = Not tested  NR = Not reported**NN = See antimicrobic comments  POCT Urinalysis Dipstick  Result Value Ref Range   Color, UA dark yellow    Clarity, UA cloudy    Glucose, UA Negative Negative   Bilirubin, UA neg    Ketones, UA neg    Spec Grav, UA 1.025 1.010 - 1.025   Blood, UA large    pH, UA 5.0 5.0 - 8.0   Protein, UA Positive (A) Negative   Urobilinogen, UA 0.2 0.2 or 1.0 E.U./dL   Nitrite, UA neg    Leukocytes, UA Large (3+) (A) Negative   Appearance cloudy    Odor foul odor       Assessment & Plan:   Problem List Items Addressed This Visit   None   Visit Diagnoses    Mass of right breast    -  Primary   Needs flu shot       Relevant Orders   Flu Vaccine QUAD High Dose(Fluad) (Completed)      Localized R upper inner 1 o clock breast mass/lump identified by patient 3 weeks ago, moderate few cm size based on her description, mild tender, without other  concerning features No systemic symptoms. No history of infection. - No prior abnormal mammogram - Last mammogram 2015 [facility] -  negative result) - Fam history breast cancer mother age 8, deceased  Order form completed for [facility] - Diagnostic Bilateral Mammogram + Right Ultrasound., they may add other ultrasound orders as indicated or other adjustment will coordinate with them as other changes needed  To be faxed today.  No orders of the defined types were placed in this encounter.     Follow up plan: Return if symptoms worsen or fail to improve.    [name], [facility] 11/16/2020, 8:41 AM

## 2020-11-16 NOTE — Patient Instructions (Addendum)
Thank you for coming to the office today.  Fax referral to [facility] today - call them tomorrow if not heard back yet.  Stay tuned for apt.  Flu Shot today.  Please schedule a Follow-up Appointment to: Return if symptoms worsen or fail to improve.  If you have any other questions or concerns, please feel free to call the office or send a message through [facility]. You may also schedule an earlier appointment if necessary.  Additionally, you may be receiving a survey about your experience at our office within a few days to 1 week by e-mail or mail. We value your feedback.  [name], DO [place]

## 2020-11-17 ENCOUNTER — Telehealth: Payer: Self-pay

## 2020-11-17 DIAGNOSIS — N6310 Unspecified lump in the right breast, unspecified quadrant: Secondary | ICD-10-CM

## 2020-11-17 NOTE — Telephone Encounter (Signed)
Ok. I was not aware that [facility] did not do Diagnostic Mammogram. That was location patient requested. Her previous images were done there in 2015.  Can you call [facility] to schedule confirm that they can access her Diagnostic Mammogram orders now? They can schedule patient or patient can call them.  [facility] [address], [place] [address] Phone: [phone]  [name], [facility] 11/17/2020, 6:00 PM

## 2020-11-17 NOTE — Telephone Encounter (Signed)
Copied from [place] [phone] [id number]. Topic: General - Other >> Nov 16, 2020  4:28 PM Celene Kras wrote: Reason for CRM: Pts husband called stating that the pt went to [facility] imaging to get a Mammogram, and that they turned her away and told her she needed a referral to [facility]. Please advise. >> Nov 17, 2020  1:56 PM Leward Quan A wrote: Patient husband called in to inquire of Dr Raliegh Ip asking can you please send the orders to [facility] so his wife can obtain a diagnostic Mammogram ASAP please. Any questions please call Ph# [phone] >> Nov 17, 2020 10:40 AM Scherrie Gerlach wrote: [facility] does not do diagnostic mammogram. Please send to [facility]

## 2020-11-18 NOTE — Telephone Encounter (Signed)
I spoke with someone at [facility]. She said she would request the last mammogram results from [facility]. Once she receives them, she will give the patient a call to schedule.

## 2020-11-20 ENCOUNTER — Other Ambulatory Visit: Payer: Self-pay | Admitting: Family Medicine

## 2020-11-20 DIAGNOSIS — R0781 Pleurodynia: Secondary | ICD-10-CM

## 2020-11-25 ENCOUNTER — Inpatient Hospital Stay
Admission: RE | Admit: 2020-11-25 | Discharge: 2020-11-25 | Disposition: A | Discharge status: Home / Self Care | Payer: Self-pay | Source: Ambulatory Visit | Attending: *Deleted | Admitting: *Deleted

## 2020-11-25 ENCOUNTER — Other Ambulatory Visit: Payer: Self-pay | Admitting: *Deleted

## 2020-11-25 DIAGNOSIS — Z1231 Encounter for screening mammogram for malignant neoplasm of breast: Secondary | ICD-10-CM

## 2020-11-25 IMAGING — CT CT 3D ACQUISTION WKST
1 of 2 series · 10 of 14 positions shown, 13 images · non-contrast
Comparison: Radiograph same day

CLINICAL DATA: Comminuted elbow fracture, planning

EXAM:
CT OF THE UPPER LEFT EXTREMITY WITHOUT CONTRAST; 3D reformats of the
elbow
TECHNIQUE: Multidetector CT imaging of the upper left extremity was performed
according to the standard protocol. 3D reformats were constructed.

[Series 3: thin st · axial · 0.26mm/px · z∈[-125,-44]mm · 10 of 333 slices shown, 13 images]
[im 31/333  soft-tissue]
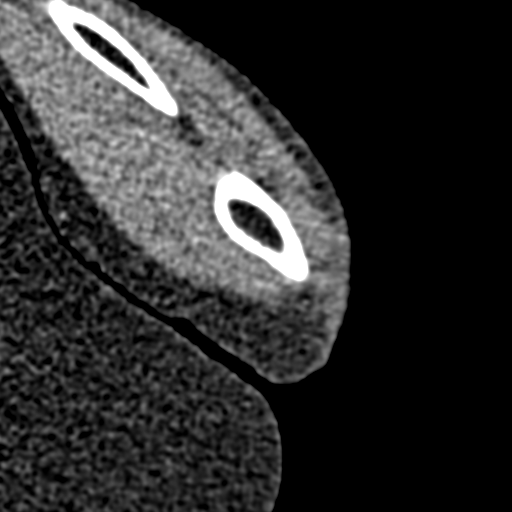
[im 31/333  bone]
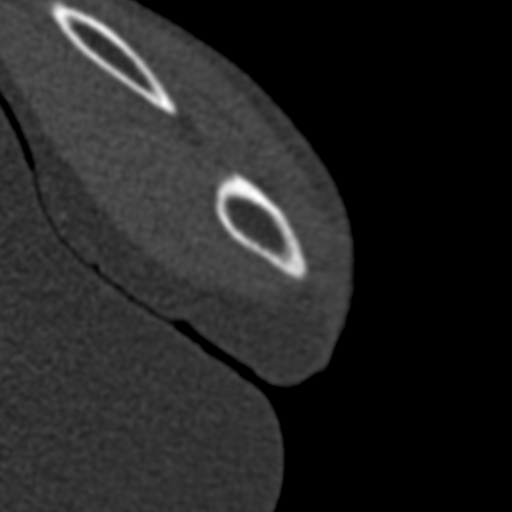
[im 61/333  bone]
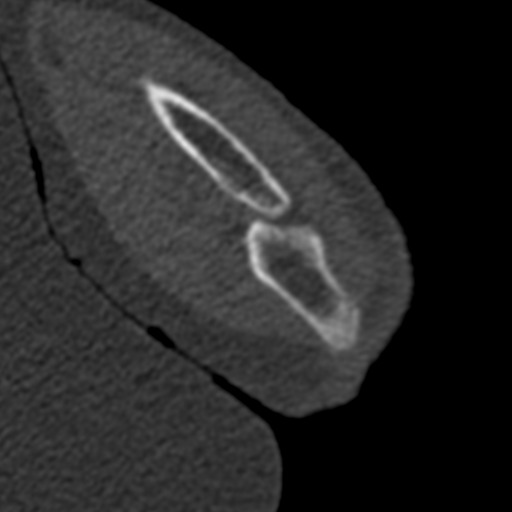
[im 91/333  bone]
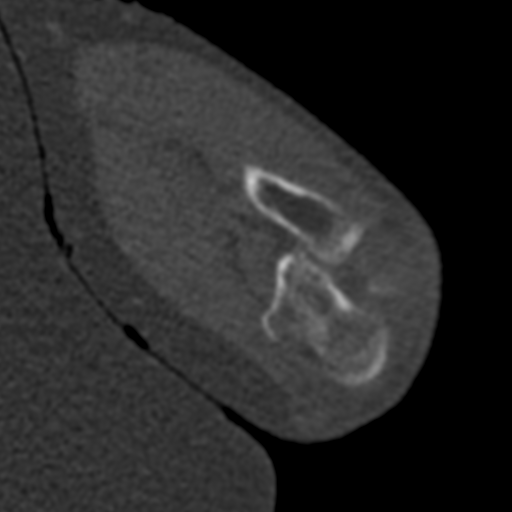
[im 121/333  bone]
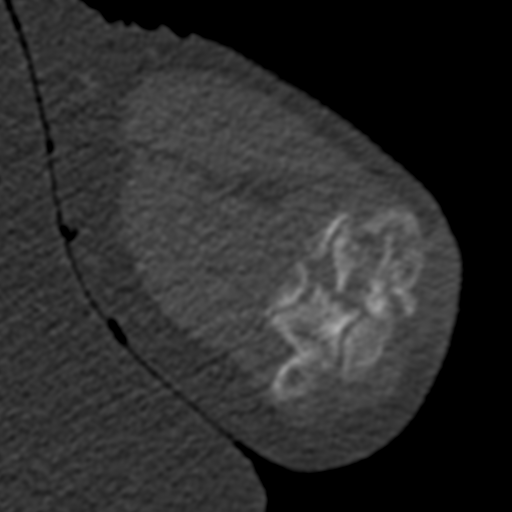
[im 151/333  soft-tissue]
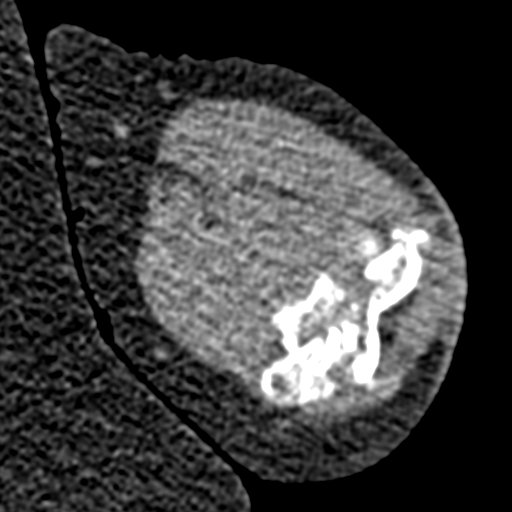
[im 151/333  bone]
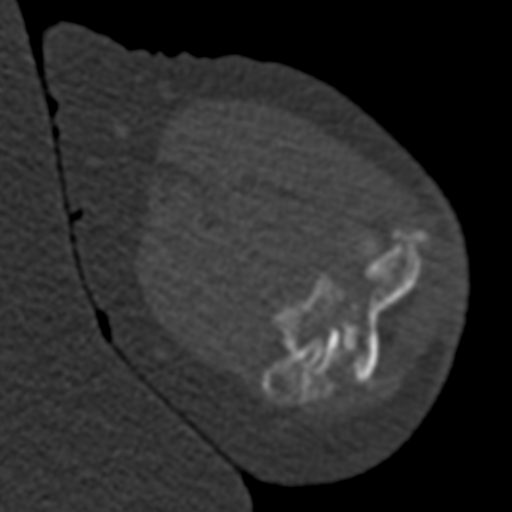
[im 182/333  bone]
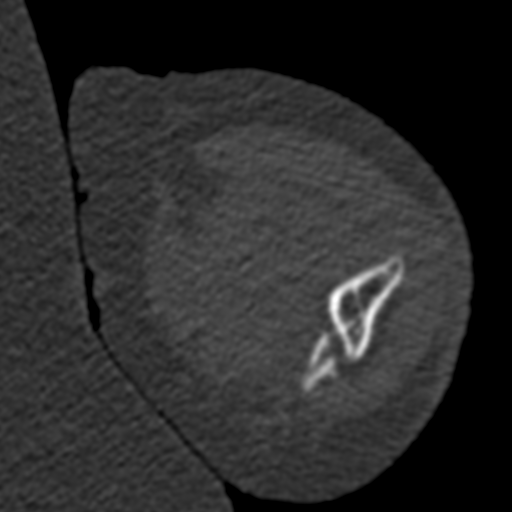
[im 212/333  bone]
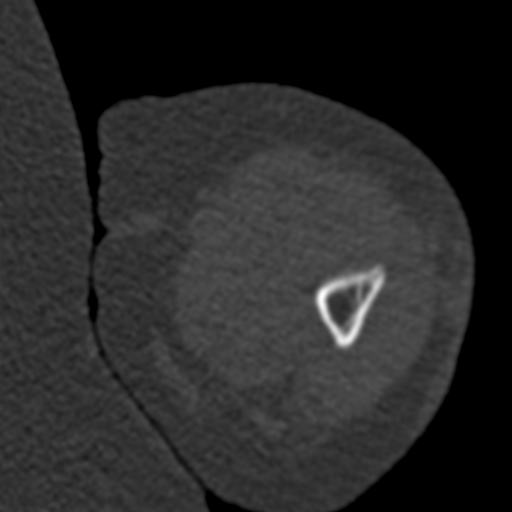
[im 242/333  bone]
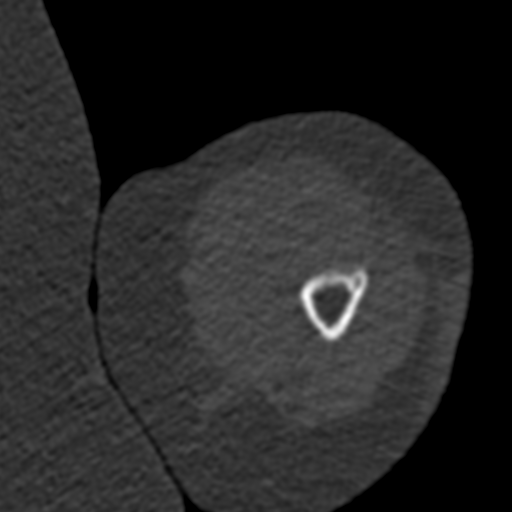
[im 272/333  soft-tissue]
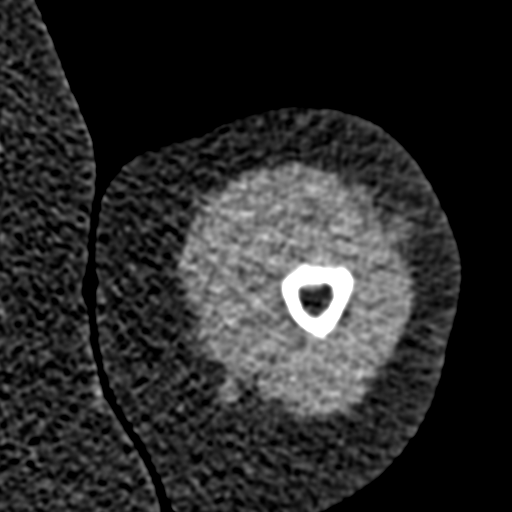
[im 272/333  bone]
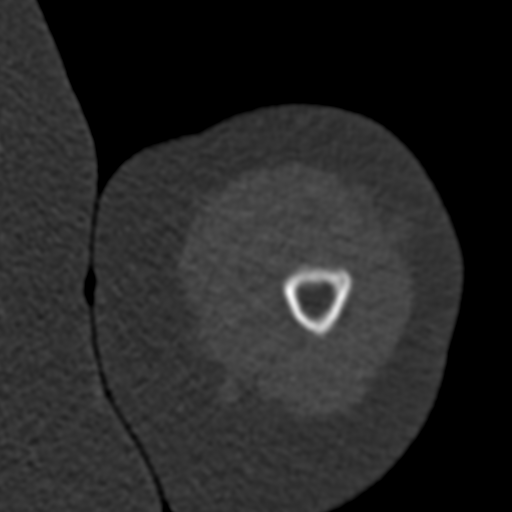
[im 302/333  bone]
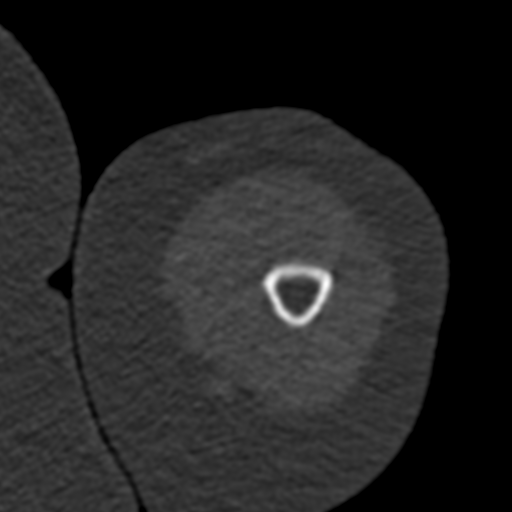

[10 of 14 positions shown; findings below may reference images not displayed]

FINDINGS: Bones/Joint/Cartilage

There is a highly comminuted transcondylar intra-articular fracture
of the distal humerus which involves both the capitellar and
trochlear surfaces. The trochlea still articulates with the ulna and
the capitellum articulates with the radial head however. There is
impaction of the distal humerus shaft on the condyles. There is
slight anterior angulation of the condyles at the elbow. No other
fracture is identified.

Ligaments

Suboptimally assessed by CT.

Muscles and Tendons

The muscles appear to be grossly intact. There is limited
visualization of the tendons.

Soft tissues

There is diffuse soft tissue swelling seen around the elbow. A small
elbow joint effusion is seen.
IMPRESSION: Comminuted intra-articular transcondylar fracture as described
above.

## 2020-11-25 IMAGING — CT CT HEAD W/O CM
3 series · 16 of 47 positions shown, 19 images · non-contrast
Comparison: Head CT 01/20/2015

CLINICAL DATA: Head trauma, minor, GCS less than or equal to 13,
low clinical risk, initial exam. Additional history provided: Fall.

EXAM:
CT HEAD WITHOUT CONTRAST
TECHNIQUE: Contiguous axial images were obtained from the base of the skull
through the vertex without intravenous contrast.

[Series 3: head wo · axial · 0.40mm/px · z∈[-117,+8]mm · 10 of 30 slices shown, 13 images]
[im 3/30  brain]
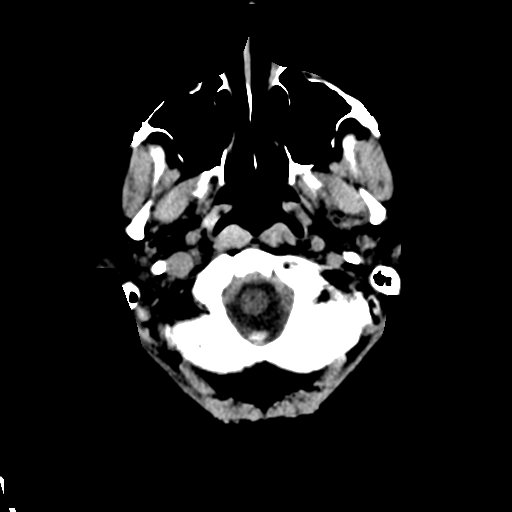
[im 3/30  bone]
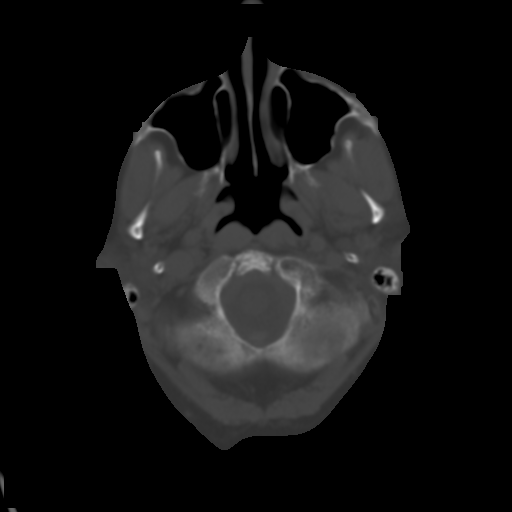
[im 6/30  brain]
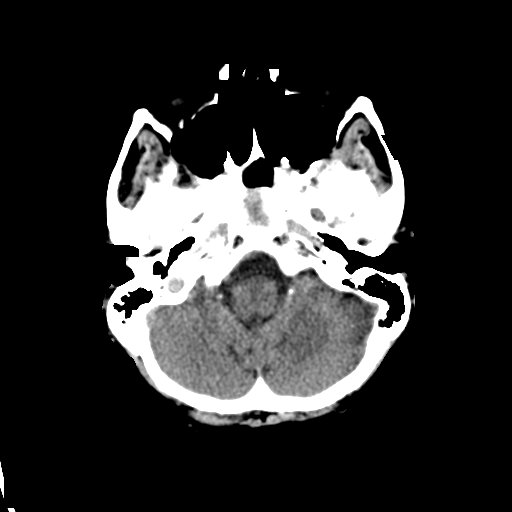
[im 9/30  brain]
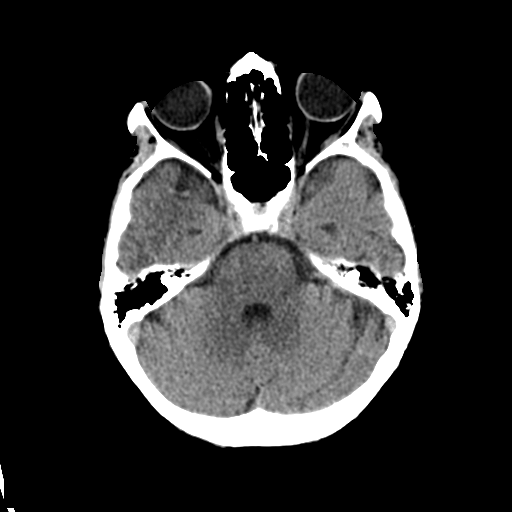
[im 11/30  brain]
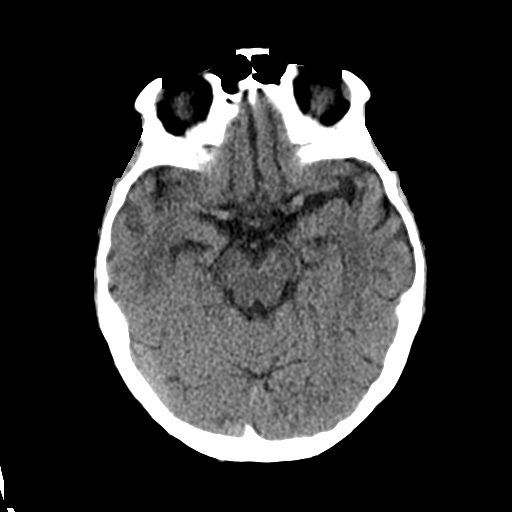
[im 14/30  brain]
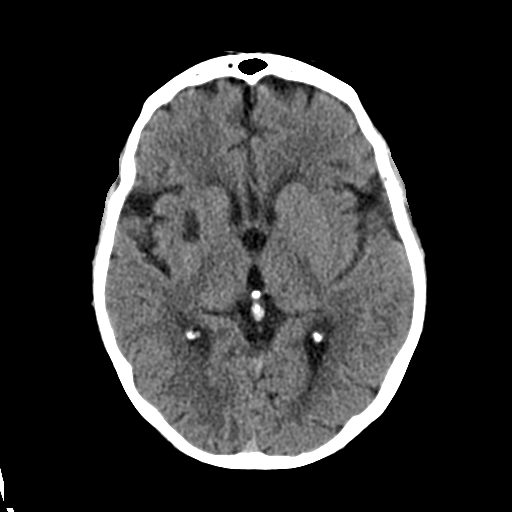
[im 14/30  bone]
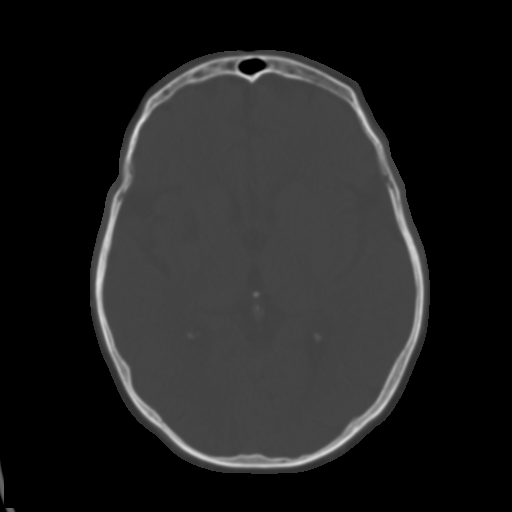
[im 17/30  brain]
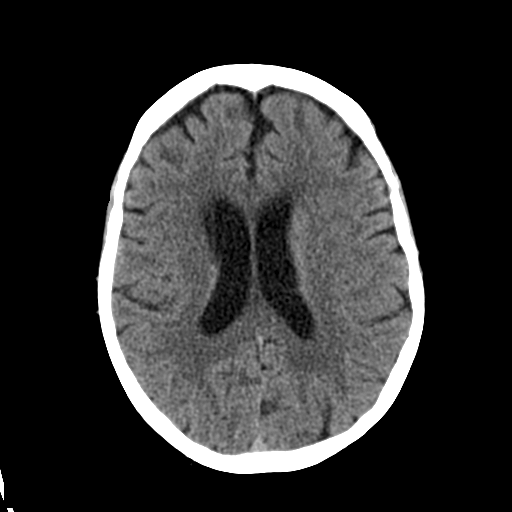
[im 20/30  brain]
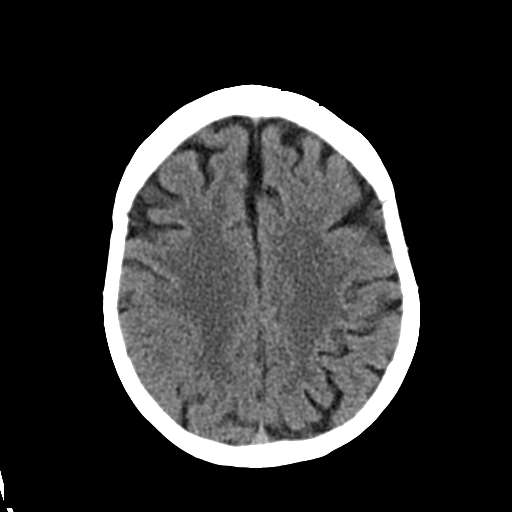
[im 23/30  brain]
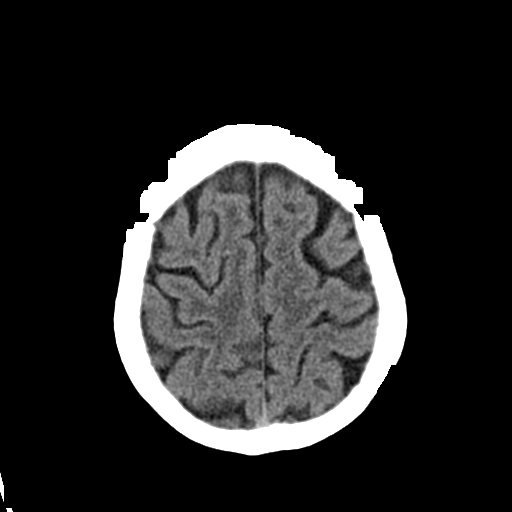
[im 25/30  brain]
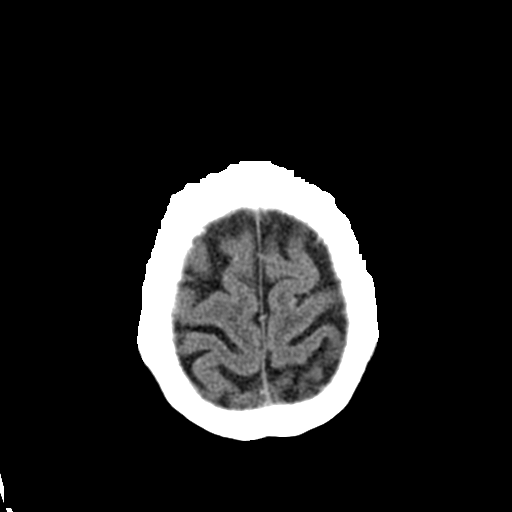
[im 25/30  bone]
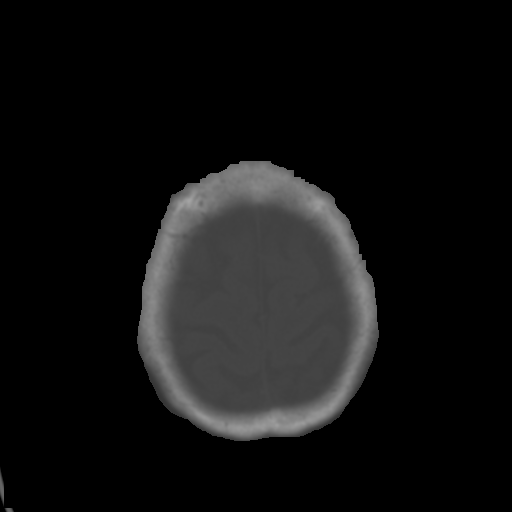
[im 28/30  brain]
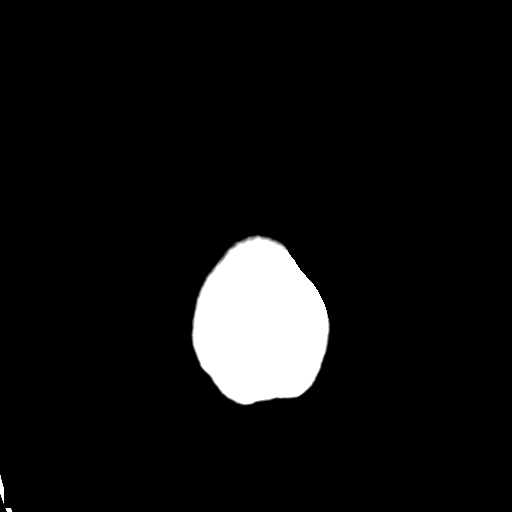

[Series 4: coronal soft tissue · coronal · 0.32mm/px · 3 of 68 slices shown]
[im 23/68  brain]
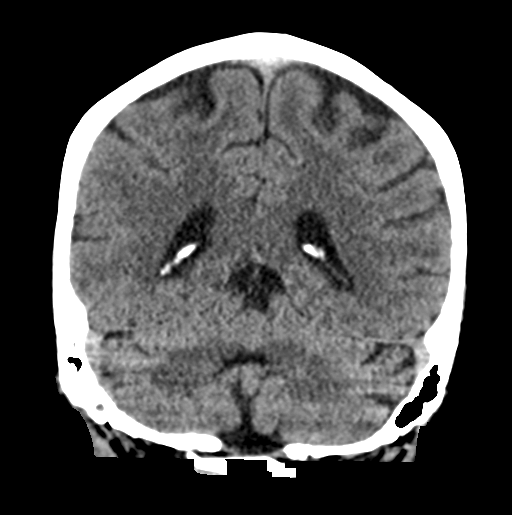
[im 30/68  brain]
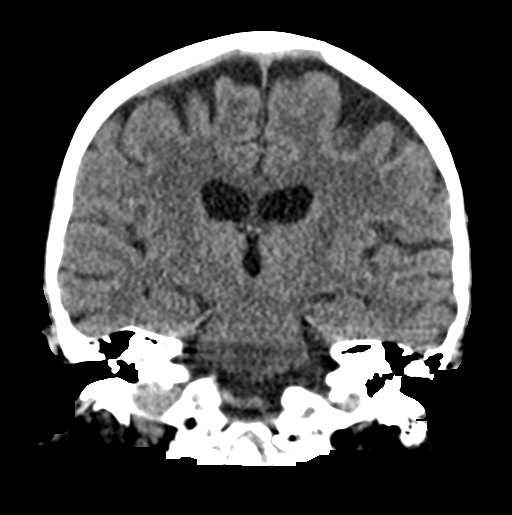
[im 38/68  brain]
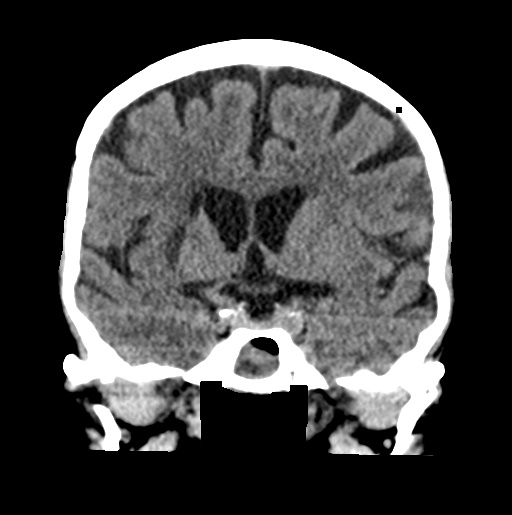

[Series 5: sagittal soft tissue · sagittal · 0.34mm/px · 3 of 48 slices shown]
[im 16/48  brain]
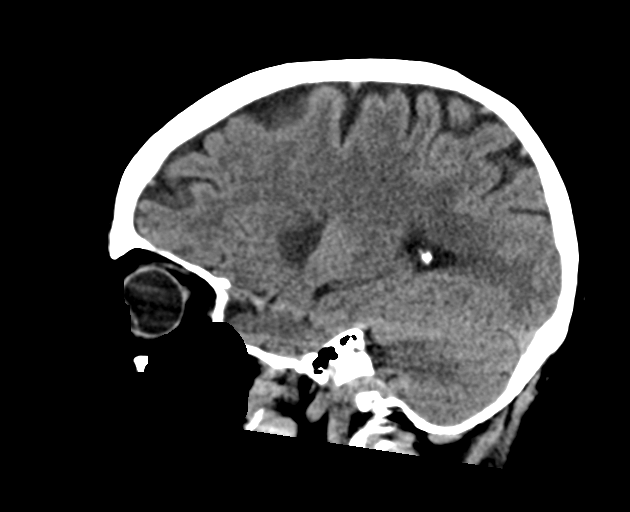
[im 24/48  brain]
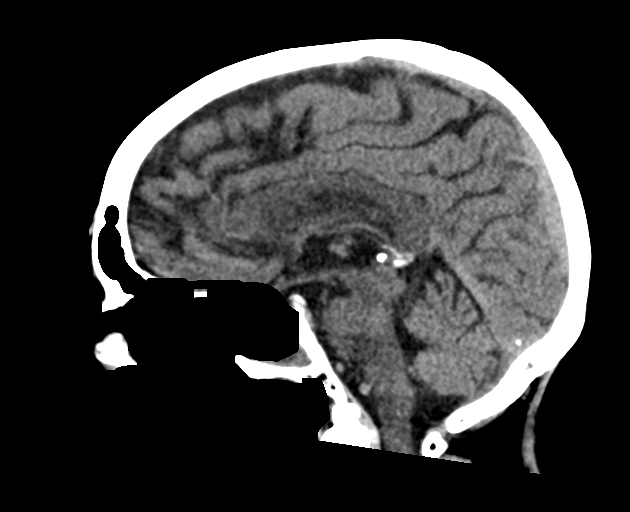
[im 32/48  brain]
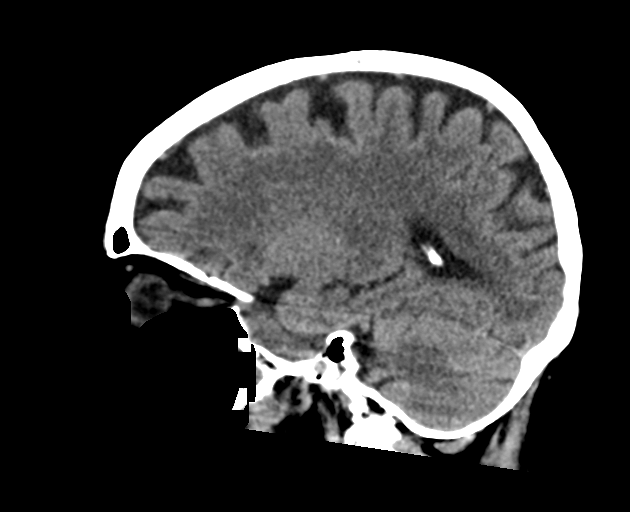

[16 of 47 positions shown; findings below may reference images not displayed]

FINDINGS: Brain:

There is no acute intracranial hemorrhage.

No demarcated cortical infarction.

No evidence of intracranial mass.

No midline shift or extra-axial fluid collection.

A chronic lacunar infarct within the right basal ganglia and
radiating white matter tracks is new from prior CT head 01/19/2015.

Mild generalized parenchymal atrophy.

Partially empty sella turcica.

Vascular: No hyperdense vessel

Skull: No calvarial fracture.

Sinuses/Orbits: Imaged globes and orbits demonstrate no acute
abnormality. Polypoid mucosal thickening within the sphenoid sinus.
Right mastoid effusion.
IMPRESSION: No evidence of acute intracranial abnormality.

A chronic lacunar infarct within the right basal ganglia and
radiating white matter tracks is new from prior head CT 01/19/2015.

Mild generalized parenchymal atrophy.

Sphenoid sinus disease.

Right mastoid effusion.

## 2020-12-03 ENCOUNTER — Ambulatory Visit
Admission: RE | Admit: 2020-12-03 | Discharge: 2020-12-03 | Disposition: A | Discharge status: Home / Self Care | Payer: Medicare Other | Source: Ambulatory Visit | Attending: Family Medicine | Admitting: Family Medicine

## 2020-12-03 ENCOUNTER — Other Ambulatory Visit: Payer: Self-pay

## 2020-12-03 DIAGNOSIS — N6312 Unspecified lump in the right breast, upper inner quadrant: Secondary | ICD-10-CM | POA: Insufficient documentation

## 2020-12-03 DIAGNOSIS — N6310 Unspecified lump in the right breast, unspecified quadrant: Secondary | ICD-10-CM

## 2020-12-03 DIAGNOSIS — R921 Mammographic calcification found on diagnostic imaging of breast: Secondary | ICD-10-CM | POA: Diagnosis not present

## 2020-12-03 DIAGNOSIS — R922 Inconclusive mammogram: Secondary | ICD-10-CM | POA: Diagnosis not present

## 2020-12-06 ENCOUNTER — Other Ambulatory Visit: Payer: Self-pay | Admitting: Family Medicine

## 2020-12-06 DIAGNOSIS — R928 Other abnormal and inconclusive findings on diagnostic imaging of breast: Secondary | ICD-10-CM

## 2020-12-06 DIAGNOSIS — N6489 Other specified disorders of breast: Secondary | ICD-10-CM

## 2020-12-06 DIAGNOSIS — R921 Mammographic calcification found on diagnostic imaging of breast: Secondary | ICD-10-CM

## 2020-12-08 ENCOUNTER — Other Ambulatory Visit: Payer: Self-pay | Admitting: Orthopedic Surgery

## 2020-12-08 DIAGNOSIS — Z9889 Other specified postprocedural states: Secondary | ICD-10-CM | POA: Diagnosis not present

## 2020-12-08 DIAGNOSIS — S32040A Wedge compression fracture of fourth lumbar vertebra, initial encounter for closed fracture: Secondary | ICD-10-CM

## 2020-12-08 DIAGNOSIS — S32050A Wedge compression fracture of fifth lumbar vertebra, initial encounter for closed fracture: Secondary | ICD-10-CM | POA: Diagnosis not present

## 2020-12-14 ENCOUNTER — Other Ambulatory Visit: Payer: Self-pay

## 2020-12-14 ENCOUNTER — Ambulatory Visit
Admission: RE | Admit: 2020-12-14 | Discharge: 2020-12-14 | Disposition: A | Discharge status: Home / Self Care | Payer: Medicare Other | Source: Ambulatory Visit | Attending: Family Medicine | Admitting: Family Medicine

## 2020-12-14 DIAGNOSIS — R928 Other abnormal and inconclusive findings on diagnostic imaging of breast: Secondary | ICD-10-CM

## 2020-12-14 DIAGNOSIS — R921 Mammographic calcification found on diagnostic imaging of breast: Secondary | ICD-10-CM

## 2020-12-14 DIAGNOSIS — N6489 Other specified disorders of breast: Secondary | ICD-10-CM | POA: Diagnosis not present

## 2020-12-14 DIAGNOSIS — Z7689 Persons encountering health services in other specified circumstances: Secondary | ICD-10-CM | POA: Diagnosis not present

## 2020-12-14 HISTORY — PX: BREAST BIOPSY: SHX20

## 2020-12-15 LAB — SURGICAL PATHOLOGY

## 2020-12-16 ENCOUNTER — Other Ambulatory Visit: Payer: Self-pay | Admitting: Family Medicine

## 2020-12-16 DIAGNOSIS — E782 Mixed hyperlipidemia: Secondary | ICD-10-CM

## 2020-12-16 DIAGNOSIS — K219 Gastro-esophageal reflux disease without esophagitis: Secondary | ICD-10-CM

## 2020-12-24 ENCOUNTER — Other Ambulatory Visit: Payer: Self-pay

## 2020-12-24 ENCOUNTER — Ambulatory Visit
Admission: RE | Admit: 2020-12-24 | Discharge: 2020-12-24 | Disposition: A | Discharge status: Home / Self Care | Payer: Medicare Other | Source: Ambulatory Visit | Attending: Orthopedic Surgery | Admitting: Orthopedic Surgery

## 2020-12-24 DIAGNOSIS — M545 Low back pain, unspecified: Secondary | ICD-10-CM | POA: Diagnosis not present

## 2020-12-24 DIAGNOSIS — S32040A Wedge compression fracture of fourth lumbar vertebra, initial encounter for closed fracture: Secondary | ICD-10-CM

## 2020-12-28 ENCOUNTER — Other Ambulatory Visit: Payer: Self-pay | Admitting: Orthopedic Surgery

## 2020-12-29 ENCOUNTER — Other Ambulatory Visit
Admission: RE | Admit: 2020-12-29 | Discharge: 2020-12-29 | Disposition: A | Discharge status: Home / Self Care | Payer: Medicare Other | Source: Ambulatory Visit | Attending: Orthopedic Surgery | Admitting: Orthopedic Surgery

## 2020-12-29 ENCOUNTER — Other Ambulatory Visit: Payer: Self-pay

## 2020-12-29 DIAGNOSIS — Z20822 Contact with and (suspected) exposure to covid-19: Secondary | ICD-10-CM | POA: Diagnosis not present

## 2020-12-29 DIAGNOSIS — Z01812 Encounter for preprocedural laboratory examination: Secondary | ICD-10-CM | POA: Insufficient documentation

## 2020-12-29 HISTORY — DX: Unspecified asthma, uncomplicated: J45.909

## 2020-12-29 LAB — SARS CORONAVIRUS 2 (TAT 6-24 HRS): SARS Coronavirus 2: NEGATIVE

## 2020-12-29 NOTE — Patient Instructions (Addendum)
Your procedure is scheduled on: Thursday, February 24 Report to the Registration Desk on the 1st floor of the [facility]. To find out your arrival time, please call [phone] between 1PM - 3PM on: Wednesday, February 23  REMEMBER: Instructions that are not followed completely may result in serious medical risk, up to and including death; or upon the discretion of your surgeon and anesthesiologist your surgery may need to be rescheduled.  Do not eat food after midnight the night before surgery.  No gum chewing, lozengers or hard candies.  You may however, drink CLEAR liquids up to 2 hours before you are scheduled to arrive for your surgery. Do not drink anything within 2 hours of your scheduled arrival time.  Clear liquids include: - water  - apple juice without pulp - gatorade (not RED, PURPLE, OR BLUE) - black coffee or tea (Do NOT add milk or creamers to the coffee or tea) Do NOT drink anything that is not on this list.  TAKE THESE MEDICATIONS THE MORNING OF SURGERY WITH A SIP OF WATER:  1.  Albuterol inhaler 2.  Amlodipine 3.  Bupropion 4.  Olanzapine 5.  Omeprazole - (take one the night before and one on the morning of surgery - helps to prevent nausea after surgery.) 6.  Oxycodone if needed for pain 7.  Venlafaxine  Use inhalers on the day of surgery and bring to the hospital.  One week prior to surgery: Stop Anti-inflammatories (NSAIDS) such as Advil, Aleve, Ibuprofen, Motrin, Naproxen, Naprosyn and Aspirin based products such as Excedrin, Goodys Powder, BC Powder. Stop ANY OVER THE COUNTER supplements until after surgery.  No Alcohol for 24 hours before or after surgery.  No Smoking including e-cigarettes for 24 hours prior to surgery.  No chewable tobacco products for at least 6 hours prior to surgery.  No nicotine patches on the day of surgery.  Do not use any "recreational" drugs for at least a week prior to your surgery.  Please be advised that the  combination of cocaine and anesthesia may have negative outcomes, up to and including death. If you test positive for cocaine, your surgery will be cancelled.  On the morning of surgery brush your teeth with toothpaste and water, you may rinse your mouth with mouthwash if you wish. Do not swallow any toothpaste or mouthwash.  Do not wear jewelry, make-up, hairpins, clips or nail polish.  Do not wear lotions, powders, or perfumes.   Do not shave body from the neck down 48 hours prior to surgery just in case you cut yourself which could leave a site for infection.  Also, freshly shaved skin may become irritated if using the CHG soap.  Contact lenses, hearing aids and dentures may not be worn into surgery.  Do not bring valuables to the hospital. [facility] is not responsible for any missing/lost belongings or valuables.   Shower using antibacterial soap the morning of surgery.  Notify your doctor if there is any change in your medical condition (cold, fever, infection).  Wear comfortable clothing (specific to your surgery type) to the hospital.  Plan for stool softeners for home use; pain medications have a tendency to cause constipation. You can also help prevent constipation by eating foods high in fiber such as fruits and vegetables and drinking plenty of fluids as your diet allows.  After surgery, you can help prevent lung complications by doing breathing exercises.  Take deep breaths and cough every 1-2 hours. Your doctor may order a  device called an Incentive Spirometer to help you take deep breaths..  If you are being discharged the day of surgery, you will not be allowed to drive home. You will need a responsible adult (18 years or older) to drive you home and stay with you that night.   If you are taking public transportation, you will need to have a responsible adult (18 years or older) with you. Please confirm with your physician that it is acceptable to use public  transportation.   Please call the [facility] at [phone] if you have any questions about these instructions.  Visitation Policy:  Patients undergoing a surgery or procedure may have one family member or support person with them as long as that person is not COVID-19 positive or experiencing its symptoms.  That person may remain in the waiting area during the procedure.

## 2020-12-30 ENCOUNTER — Ambulatory Visit: Payer: Medicare Other

## 2020-12-30 ENCOUNTER — Other Ambulatory Visit: Payer: Self-pay

## 2020-12-30 ENCOUNTER — Ambulatory Visit
Admission: RE | Admit: 2020-12-30 | Discharge: 2020-12-30 | Disposition: A | Discharge status: Home / Self Care | Payer: Medicare Other | Attending: Orthopedic Surgery | Admitting: Orthopedic Surgery

## 2020-12-30 ENCOUNTER — Ambulatory Visit: Payer: Medicare Other | Admitting: Certified Registered Nurse Anesthetist

## 2020-12-30 ENCOUNTER — Encounter
Admission: RE | Disposition: A | Discharge status: Home / Self Care | Payer: Self-pay | Source: Home / Self Care | Attending: Orthopedic Surgery

## 2020-12-30 ENCOUNTER — Encounter: Payer: Self-pay | Admitting: Orthopedic Surgery

## 2020-12-30 DIAGNOSIS — M48061 Spinal stenosis, lumbar region without neurogenic claudication: Secondary | ICD-10-CM | POA: Diagnosis not present

## 2020-12-30 DIAGNOSIS — M8088XA Other osteoporosis with current pathological fracture, vertebra(e), initial encounter for fracture: Secondary | ICD-10-CM | POA: Insufficient documentation

## 2020-12-30 DIAGNOSIS — K219 Gastro-esophageal reflux disease without esophagitis: Secondary | ICD-10-CM | POA: Diagnosis not present

## 2020-12-30 DIAGNOSIS — Z79899 Other long term (current) drug therapy: Secondary | ICD-10-CM | POA: Diagnosis not present

## 2020-12-30 DIAGNOSIS — S32050A Wedge compression fracture of fifth lumbar vertebra, initial encounter for closed fracture: Secondary | ICD-10-CM | POA: Insufficient documentation

## 2020-12-30 DIAGNOSIS — E785 Hyperlipidemia, unspecified: Secondary | ICD-10-CM | POA: Diagnosis not present

## 2020-12-30 DIAGNOSIS — X501XXA Overexertion from prolonged static or awkward postures, initial encounter: Secondary | ICD-10-CM | POA: Diagnosis not present

## 2020-12-30 DIAGNOSIS — Z419 Encounter for procedure for purposes other than remedying health state, unspecified: Secondary | ICD-10-CM

## 2020-12-30 DIAGNOSIS — Z981 Arthrodesis status: Secondary | ICD-10-CM | POA: Diagnosis not present

## 2020-12-30 DIAGNOSIS — S22000A Wedge compression fracture of unspecified thoracic vertebra, initial encounter for closed fracture: Secondary | ICD-10-CM | POA: Diagnosis not present

## 2020-12-30 DIAGNOSIS — I10 Essential (primary) hypertension: Secondary | ICD-10-CM | POA: Diagnosis not present

## 2020-12-30 HISTORY — PX: KYPHOPLASTY: SHX5884

## 2020-12-30 SURGERY — KYPHOPLASTY
Anesthesia: Monitor Anesthesia Care

## 2020-12-30 MED ORDER — ORAL CARE MOUTH RINSE: 15.0000 mL | Freq: Once | OROMUCOSAL | Status: AC

## 2020-12-30 MED ORDER — LIDOCAINE HCL (CARDIAC) PF 100 MG/5ML IV SOSY
PREFILLED_SYRINGE | INTRAVENOUS | Status: DC | PRN
  Administered 2020-12-30: 40 mg via INTRAVENOUS

## 2020-12-30 MED ORDER — CEFAZOLIN SODIUM-DEXTROSE 2-4 GM/100ML-% IV SOLN
2.0000 g | INTRAVENOUS | Status: AC
  Administered 2020-12-30: 2 g via INTRAVENOUS

## 2020-12-30 MED ORDER — BUPIVACAINE-EPINEPHRINE (PF) 0.5% -1:200000 IJ SOLN
INTRAMUSCULAR | Status: DC | PRN
  Administered 2020-12-30: 15 mL

## 2020-12-30 MED ORDER — CHLORHEXIDINE GLUCONATE 0.12 % MT SOLN: 15.0000 mL | Freq: Once | OROMUCOSAL | Status: AC

## 2020-12-30 MED ORDER — DEXAMETHASONE SODIUM PHOSPHATE 10 MG/ML IJ SOLN
INTRAMUSCULAR | Status: DC | PRN
  Administered 2020-12-30: 5 mg via INTRAVENOUS

## 2020-12-30 MED ORDER — PROPOFOL 500 MG/50ML IV EMUL
INTRAVENOUS | Status: DC | PRN
  Administered 2020-12-30: 30 ug/kg/min via INTRAVENOUS

## 2020-12-30 MED ORDER — ONDANSETRON HCL 4 MG/2ML IJ SOLN
INTRAMUSCULAR | Status: DC | PRN
  Administered 2020-12-30: 4 mg via INTRAVENOUS

## 2020-12-30 MED ORDER — LIDOCAINE HCL (PF) 1 % IJ SOLN
INTRAMUSCULAR | Status: AC
  Filled 2020-12-30: qty 30

## 2020-12-30 MED ORDER — PROPOFOL 10 MG/ML IV BOLUS
INTRAVENOUS | Status: DC | PRN
  Administered 2020-12-30 (×3): 20 mg via INTRAVENOUS

## 2020-12-30 MED ORDER — BUPIVACAINE-EPINEPHRINE (PF) 0.5% -1:200000 IJ SOLN
INTRAMUSCULAR | Status: AC
  Filled 2020-12-30: qty 90

## 2020-12-30 MED ORDER — MIDAZOLAM HCL 2 MG/2ML IJ SOLN
INTRAMUSCULAR | Status: AC
  Filled 2020-12-30: qty 2

## 2020-12-30 MED ORDER — LIDOCAINE HCL 1 % IJ SOLN
INTRAMUSCULAR | Status: DC | PRN
  Administered 2020-12-30: 25 mL

## 2020-12-30 MED ORDER — OXYCODONE HCL 5 MG PO TABS: 5.0000 mg | ORAL_TABLET | Freq: Once | ORAL | Status: DC | PRN

## 2020-12-30 MED ORDER — OXYCODONE HCL 5 MG PO TABS: 5.0000 mg | ORAL_TABLET | ORAL | 0 refills | Status: DC | PRN

## 2020-12-30 MED ORDER — IOHEXOL 180 MG/ML  SOLN
INTRAMUSCULAR | Status: DC | PRN
  Administered 2020-12-30: 40 mL

## 2020-12-30 MED ORDER — FENTANYL CITRATE (PF) 100 MCG/2ML IJ SOLN
25.0000 ug | INTRAMUSCULAR | Status: DC | PRN
Start: 2020-12-30 — End: 2020-12-30
  Administered 2020-12-30: 25 ug via INTRAVENOUS

## 2020-12-30 MED ORDER — FENTANYL CITRATE (PF) 100 MCG/2ML IJ SOLN
INTRAMUSCULAR | Status: AC
  Administered 2020-12-30: 25 ug via INTRAVENOUS
  Filled 2020-12-30: qty 2

## 2020-12-30 MED ORDER — LACTATED RINGERS IV SOLN: INTRAVENOUS | Status: DC

## 2020-12-30 MED ORDER — OXYCODONE HCL 5 MG/5ML PO SOLN: 5.0000 mg | Freq: Once | ORAL | Status: DC | PRN

## 2020-12-30 MED ORDER — CHLORHEXIDINE GLUCONATE 0.12 % MT SOLN
OROMUCOSAL | Status: AC
  Administered 2020-12-30: 15 mL via OROMUCOSAL
  Filled 2020-12-30: qty 15

## 2020-12-30 MED ORDER — MIDAZOLAM HCL 2 MG/2ML IJ SOLN
INTRAMUSCULAR | Status: DC | PRN
  Administered 2020-12-30 (×2): 1 mg via INTRAVENOUS

## 2020-12-30 MED ORDER — CEFAZOLIN SODIUM-DEXTROSE 2-4 GM/100ML-% IV SOLN
INTRAVENOUS | Status: AC
  Filled 2020-12-30: qty 100

## 2020-12-30 SURGICAL SUPPLY — 21 items
ADH SKN CLS APL DERMABOND .7 (GAUZE/BANDAGES/DRESSINGS) ×1
CEMENT KYPHON CX01A KIT/MIXER (Cement) ×2 IMPLANT
COVER WAND RF STERILE (DRAPES) ×2 IMPLANT
DERMABOND ADVANCED (GAUZE/BANDAGES/DRESSINGS) ×1
DERMABOND ADVANCED .7 DNX12 (GAUZE/BANDAGES/DRESSINGS) ×1 IMPLANT
DEVICE BIOPSY BONE KYPHX (INSTRUMENTS) ×2 IMPLANT
DRAPE C-ARM XRAY 36X54 (DRAPES) ×2 IMPLANT
DURAPREP 26ML APPLICATOR (WOUND CARE) ×2 IMPLANT
GLOVE SURG SYN 9.0  PF PI (GLOVE) ×1
GLOVE SURG SYN 9.0 PF PI (GLOVE) ×1 IMPLANT
GOWN SRG 2XL LVL 4 RGLN SLV (GOWNS) ×1 IMPLANT
GOWN STRL NON-REIN 2XL LVL4 (GOWNS) ×2
GOWN STRL REUS W/ TWL LRG LVL3 (GOWN DISPOSABLE) ×1 IMPLANT
GOWN STRL REUS W/TWL LRG LVL3 (GOWN DISPOSABLE) ×2
MANIFOLD NEPTUNE II (INSTRUMENTS) ×2 IMPLANT
PACK KYPHOPLASTY (MISCELLANEOUS) ×2 IMPLANT
RENTAL RFA GENERATOR (MISCELLANEOUS) IMPLANT
STRAP SAFETY 5IN WIDE (MISCELLANEOUS) ×2 IMPLANT
SWABSTK COMLB BENZOIN TINCTURE (MISCELLANEOUS) ×2 IMPLANT
TRAY KYPHOPAK 15/3 EXPRESS 1ST (MISCELLANEOUS) IMPLANT
TRAY KYPHOPAK 20/3 EXPRESS 1ST (MISCELLANEOUS) ×2 IMPLANT

## 2020-12-30 NOTE — Anesthesia Postprocedure Evaluation (Signed)
Anesthesia Post Note  Patient: Pamela Ball  Procedure(s) Performed: L5 KYPHOPLASTY (N/A )  Patient location during evaluation: PACU Anesthesia Type: MAC Level of consciousness: awake and alert Pain management: pain level controlled Vital Signs Assessment: post-procedure vital signs reviewed and stable Respiratory status: spontaneous breathing, nonlabored ventilation, respiratory function stable and patient connected to nasal cannula oxygen Cardiovascular status: blood pressure returned to baseline and stable Postop Assessment: no apparent nausea or vomiting Anesthetic complications: no   No complications documented.   Last Vitals:  Vitals:   12/30/20 1400 12/30/20 1418  BP: 129/61 (!) 161/61  Pulse: 81 89  Resp: 13 16  Temp: 37.1 C 37.2 C  SpO2: 91% 93%    Last Pain:  Vitals:   12/30/20 1418  TempSrc: Temporal  PainSc: 0-No pain                 Precious Haws Rondarius Kadrmas

## 2020-12-30 NOTE — Op Note (Signed)
12/30/2020  1:31 PM  PATIENT:  Pamela Ball   MRN: [id number]   PRE-OPERATIVE DIAGNOSIS:  closed wedge compression fracture of L5   POST-OPERATIVE DIAGNOSIS:  closed wedge compression fracture of L5   PROCEDURE:  Procedure(s): KYPHOPLASTY L5  SURGEON: Laurene Footman, MD   ASSISTANTS: None   ANESTHESIA:   local and MAC   EBL:  No intake/output data recorded.   BLOOD ADMINISTERED:none   DRAINS: none    LOCAL MEDICATIONS USED:  MARCAINE    and XYLOCAINE    SPECIMEN:   None   DISPOSITION OF SPECIMEN:  Not applicable   COUNTS:  YES   TOURNIQUET:  * No tourniquets in log *   IMPLANTS: Bone cement   DICTATION: .Dragon Dictation  patient was brought to the operating room and after adequate anesthesia was obtained the patient was placed prone.  C arm was brought in in good visualization of the affected level obtained on both AP and lateral projections.  After patient identification and timeout procedures were completed, local anesthetic was infiltrated with 10 cc 1% Xylocaine infiltrated subcutaneously.  This is done the area on the each side of the planned approach.  The back was then prepped and draped in the usual sterile manner and repeat timeout procedure carried out.  A spinal needle was brought down to the pedicle on the each side of  L5 and a 50-50 mix of 1% Xylocaine half percent Sensorcaine with epinephrine total of 20 cc injected on the right side.  After allowing this to set a small incision was made and the trocar was advanced into the vertebral body in an extrapedicular fashion.  Biopsy was not obtained despite attempt Drilling was carried out balloon inserted with inflation to  3-1/2 cc on the right and across the midline so second stick was not required. When the cement was appropriate consistency 6 cc were injected on the right into the vertebral body with a small amount of extravasation posteriorly, good fill superior to inferior endplates and from right to left sides  along the inferior endplate.  After the cement had set the trochar was removed and permanent C-arm views obtained.  The wound was closed with Dermabond followed by Band-Aid   PLAN OF CARE:  Discharge home after recovery room   PATIENT DISPOSITION:  PACU - hemodynamically stable.

## 2020-12-30 NOTE — Discharge Instructions (Addendum)
Take it easy today. Try to start walking is much as you can starting tomorrow. Remove Band-Aid on Saturday then okay to shower. Pain medicine as directed. Call office if having problems.  AMBULATORY SURGERY  DISCHARGE INSTRUCTIONS   1) The drugs that you were given will stay in your system until tomorrow so for the next 24 hours you should not:  A) Drive an automobile B) Make any legal decisions C) Drink any alcoholic beverage   2) You may resume regular meals tomorrow.  Today it is better to start with liquids and gradually work up to solid foods.  You may eat anything you prefer, but it is better to start with liquids, then soup and crackers, and gradually work up to solid foods.   3) Please notify your doctor immediately if you have any unusual bleeding, trouble breathing, redness and pain at the surgery site, drainage, fever, or pain not relieved by medication.    4) Additional Instructions:    Please contact your physician with any problems or Same Day Surgery at [phone], Monday through Friday 6 am to 4 pm, or [place] at [facility] number at [phone].

## 2020-12-30 NOTE — H&P (Signed)
Chief Complaint  Patient presents with  . Lower Back - Follow-up, Pain   Pamela Ball is a 75 y.o. female who presents today for evaluation of acute midline low back pain. Patient describes moderate midline lower back pain that has been present for 3 days. She states she bent over to pick something up off the floor and felt a pop in her lower back. She is unable to get comfortable. She has pain with standing sitting walking and laying down. She has been taken Tylenol with mild improvement. No numbness tingling or radicular symptoms. She has a history of L3, L2, L1, T10 kyphoplasty procedures. She had been doing well up until a few days ago.  Past Medical History: Past Medical History:  Diagnosis Date  . Asthma without status asthmaticus, unspecified  . Depression  . GERD (gastroesophageal reflux disease)  . Hyperlipidemia   Past Surgical History: Past Surgical History:  Procedure Laterality Date  . BACK SURGERY  Lumbar  . HYSTERECTOMY  . L1 kyphoplasty 02/11/2015  Dr.Reno Clasby  . LENS EYE SURGERY Bilateral 2016  [facility]- Dr. Sandra Cockayne   Past Family History: Family History  Problem Relation Age of Onset  . No Known Problems Mother  . No Known Problems Father   Medications: Current Outpatient Medications Ordered in Epic  Medication Sig Dispense Refill  . albuterol 90 mcg/actuation inhaler INHALE 2 PUFFS INTO THE LUNGS EVERY 4 HOURS AS NEEDED FOR WHEEZING OR SHORTNESS OF BREATH (COUGH)  . amLODIPine (NORVASC) 10 MG tablet Take by mouth  . buPROPion (WELLBUTRIN XL) 150 MG XL tablet Take 150 mg by mouth once daily. 5  . busPIRone (BUSPAR) 10 MG tablet Take 10 mg by mouth 2 (two) times daily. 5  . multivitamin tablet Take by mouth  . OLANZapine (ZYPREXA) 7.5 MG tablet Take 7.5 mg by mouth nightly  . omeprazole (PRILOSEC) 20 MG DR capsule Take 20 mg by mouth once daily. 3  . ondansetron (ZOFRAN-ODT) 4 MG disintegrating tablet TAKE 1 TABLET BY MOUTH EVERY 8 HOURS AS NEEDED FOR  NAUSEA AND VOMITING  . simvastatin (ZOCOR) 20 MG tablet Take 20 mg by mouth once daily. 3  . tiZANidine (ZANAFLEX) 4 MG tablet Take 1 tablet (4 mg total) by mouth 3 (three) times daily 21 tablet 0  . venlafaxine (EFFEXOR-XR) 75 MG XR capsule Take 1 capsule by mouth once daily. 5  . vitamin A 10000 UNIT capsule Take by mouth  . oxyCODONE (ROXICODONE) 5 MG immediate release tablet Take 1 tablet (5 mg total) by mouth every 4 (four) hours as needed for Pain 30 tablet 0   No current Epic-ordered facility-administered medications on file.   Allergies: No Known Allergies   Review of Systems:  A comprehensive 14 point ROS was performed, reviewed by me today, and the pertinent orthopaedic findings are documented in the HPI.  Exam: BP 130/80  Ht 147.3 cm (4\' 10" )  Wt 54.9 kg (121 lb)  BMI 25.29 kg/m  General:  Well developed, well nourished, no apparent distress, normal affect, antalgic gait. Appears to be uncomfortable while sitting.  HEENT: Head normocephalic, atraumatic, PERRL.   Abdomen: Soft, non tender, non distended, Bowel sounds present.  Heart: Examination of the heart reveals regular, rate, and rhythm. There is no murmur noted on ascultation. There is a normal apical pulse.  Lungs: Lungs are clear to auscultation. There is no wheeze, rhonchi, or crackles. There is normal expansion of bilateral chest walls.   Thoracolumbar spine Examination of the thoracolumbar spine  shows spinous process tenderness on the midline of the lumbar spine. She has good range of motion of the spine with flexion extension but has moderate pain with movement. She is nontender along the paravertebral muscles. Nontender along the sacrum or SI joints. Patient has full range of motion both hips with no discomfort. She is neuro vas intact in bilateral lower extremities.  Imaging: AP and lateral views of the thoracolumbar spine are ordered interpreted by me in the office today. Impression: Patient has  evidence of acute L4 compression fracture with 40% loss of vertebral body height. Some concern for possible inferior endplate compression deformity of L5. Severe lower lumbar facet arthritis. Methylmethacrylate present along L3, L2, L1, T10 vertebral bodies. No evidence of T11 or T12 compression fractures.  EXAM:  MRI LUMBAR SPINE WITHOUT CONTRAST   TECHNIQUE:  Multiplanar, multisequence MR imaging of the lumbar spine was  performed. No intravenous contrast was administered.   COMPARISON: 03/03/2020   FINDINGS:  Segmentation: 5 lumbar type vertebrae   Alignment: Kyphotic deformity at the level of L1 due to remote  fracture. Otherwise hyperlordosis.   Vertebrae: Marrow edema throughout the L5 body with mild height  loss. Edematous signal around the bilateral L5-S1 facets which could  be degenerative or secondary to the trauma. No fracture deformity is  seen at the posterior elements.   L4 compression deformity with moderate height loss since 2021  comparison, but healed.   Remote T10, L1, L2, and L3 vertebral body fractures with cement  augmentation. Height loss was particularly advanced at L1 with  retropulsion and conus compression.   On STIR imaging there is minimally covered hyperintensity at the  left sacral ala, not covered on axial slices.   Conus medullaris and cauda equina: Conus extends to the T12-L1  level. Conus and cauda equina appear normal. A small amount of  hemorrhage may be present posterior to the L5 body. No complete  thecal sac effacement   Paraspinal and other soft tissues: Atrophy of intrinsic back  muscles. Left renal cyst.   Disc levels:   Generalized degenerative facet spurring. Altered disc spaces  primarily from old trauma. No degenerative impingement.   IMPRESSION:  1. Acute L5 compression fracture with mild height loss and no  retropulsion.  2. Suspect acute left sacral ala insufficiency fracture but only  partially covered.  3. L4  compression fracture with moderate height loss, new from April  2021 but healed.  4. Remote T10, L1, L2, and L3 vertebral body fractures. 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   Electronically Signed  By: Monte Fantasia M.D.  On: 12/25/2020 05:49  Impression: Closed compression fracture of L5 lumbar vertebra, initial encounter (CMS-HCC) [S32.050A] Closed compression fracture of L5 lumbar vertebra, initial encounter (CMS-HCC) (primary encounter diagnosis)  Plan:  50. 75 year old female with evidence of new L4 compression fracture. MRI was obtained showing healed L4 compression fracture but significant marrow edema on STIR images within the L5 vertebral body consistent with an acute L5 compression fracture. Due to patient's severe debilitating pain and limitations in activities daily living I have recommended kyphoplasty procedure L5. Risks, benefits, complications of L5 kyphoplasty have been discussed with the patient. Patient would like to proceed with L5 kyphoplasty as soon as possible.  This note was generated in part with voice recognition software and I apologize for any typographical errors that were not detected and corrected.  Feliberto Gottron MPA-C    Electronically signed by Feliberto Gottron, [place] at  12/28/2020 12:26 PM EST    Reviewed  H+P. No changes noted.

## 2020-12-30 NOTE — Transfer of Care (Signed)
Immediate Anesthesia Transfer of Care Note  Patient: Pamela Ball  Procedure(s) Performed: L5 KYPHOPLASTY (N/A )  Patient Location: PACU  Anesthesia Type:General  Level of Consciousness: awake, drowsy and patient cooperative  Airway & Oxygen Therapy: Patient Spontanous Breathing  Post-op Assessment: Report given to RN and Post -op Vital signs reviewed and stable  Post vital signs: Reviewed and stable  Last Vitals:  Vitals Value Taken Time  BP 125/61 12/30/20 1330  Temp    Pulse 82 12/30/20 1332  Resp 14 12/30/20 1332  SpO2 92 % 12/30/20 1332  Vitals shown include unvalidated device data.  Last Pain:  Vitals:   12/30/20 1210  TempSrc: Oral  PainSc: 0-No pain         Complications: No complications documented.

## 2020-12-30 NOTE — Anesthesia Preprocedure Evaluation (Signed)
Anesthesia Evaluation  Patient identified by MRN, date of birth, ID band Patient awake    Reviewed: Allergy & Precautions, H&P , NPO status , Patient's Chart, lab work & pertinent test results, reviewed documented beta blocker date and time   Airway Mallampati: II  TM Distance: >3 FB Neck ROM: full    Dental no notable dental hx. (+) Teeth Intact   Pulmonary COPD,    Pulmonary exam normal breath sounds clear to auscultation       Cardiovascular Exercise Tolerance: Poor hypertension, On Medications negative cardio ROS   Rhythm:regular Rate:Normal     Neuro/Psych  Headaches, Anxiety Depression Bipolar Disorder negative psych ROS   GI/Hepatic Neg liver ROS, hiatal hernia, GERD  ,  Endo/Other  negative endocrine ROSdiabetes  Renal/GU      Musculoskeletal   Abdominal   Peds  Hematology  (+) Blood dyscrasia, anemia ,   Anesthesia Other Findings   Reproductive/Obstetrics negative OB ROS                             Anesthesia Physical  Anesthesia Plan  ASA: III  Anesthesia Plan: MAC   Post-op Pain Management:    Induction: Intravenous  PONV Risk Score and Plan:   Airway Management Planned: Natural Airway and Nasal Cannula  Additional Equipment:   Intra-op Plan:   Post-operative Plan:   Informed Consent: I have reviewed the patients History and Physical, chart, labs and discussed the procedure including the risks, benefits and alternatives for the proposed anesthesia with the patient or authorized representative who has indicated his/her understanding and acceptance.     Dental Advisory Given  Plan Discussed with: CRNA  Anesthesia Plan Comments: (Patient consented for risks of anesthesia including but not limited to:  - adverse reactions to medications - risk of airway placement if required - damage to eyes, teeth, lips or other oral mucosa - nerve damage due to  positioning  - sore throat or hoarseness - Damage to heart, brain, nerves, lungs, other parts of body or loss of life  Patient voiced understanding.)        Anesthesia Quick Evaluation

## 2020-12-31 ENCOUNTER — Encounter: Payer: Self-pay | Admitting: Orthopedic Surgery

## 2021-01-13 DIAGNOSIS — Z9889 Other specified postprocedural states: Secondary | ICD-10-CM | POA: Diagnosis not present

## 2021-01-13 DIAGNOSIS — M898X1 Other specified disorders of bone, shoulder: Secondary | ICD-10-CM | POA: Diagnosis not present

## 2021-02-08 ENCOUNTER — Ambulatory Visit (INDEPENDENT_AMBULATORY_CARE_PROVIDER_SITE_OTHER): Payer: Medicare Other

## 2021-02-08 VITALS — Ht <= 58 in | Wt 124.0 lb

## 2021-02-08 DIAGNOSIS — Z0000 Encounter for general adult medical examination without abnormal findings: Secondary | ICD-10-CM

## 2021-02-08 NOTE — Patient Instructions (Signed)
Ms. Pamela Ball , Thank you for taking time to come for your Medicare Wellness Visit. I appreciate your ongoing commitment to your health goals. Please review the following plan we discussed and let me know if I can assist you in the future.   Screening recommendations/referrals: Colonoscopy: completed 06/25/2018, due 06/25/2021 Mammogram: completed 12/14/2020 Bone Density: completed 12/30/2018 Recommended yearly ophthalmology/optometry visit for glaucoma screening and checkup Recommended yearly dental visit for hygiene and checkup  Vaccinations: Influenza vaccine: completed 11/16/2020 Pneumococcal vaccine: due Tdap vaccine: completed 11/14/2017, due 11/15/2027 Shingles vaccine: discussed   Covid-19: 07/27/2020, 06/24/2020  Advanced directives: Advance directive discussed with you today.  Conditions/risks identified: none  Next appointment: Follow up in one year for your annual wellness visit    Preventive Care 65 Years and Older, Female Preventive care refers to lifestyle choices and visits with your health care provider that can promote health and wellness. What does preventive care include?  A yearly physical exam. This is also called an annual well check.  Dental exams once or twice a year.  Routine eye exams. Ask your health care provider how often you should have your eyes checked.  Personal lifestyle choices, including:  Daily care of your teeth and gums.  Regular physical activity.  Eating a healthy diet.  Avoiding tobacco and drug use.  Limiting alcohol use.  Practicing safe sex.  Taking low-dose aspirin every day.  Taking vitamin and mineral supplements as recommended by your health care provider. What happens during an annual well check? The services and screenings done by your health care provider during your annual well check will depend on your age, overall health, lifestyle risk factors, and family history of disease. Counseling  Your health care provider may ask  you questions about your:  Alcohol use.  Tobacco use.  Drug use.  Emotional well-being.  Home and relationship well-being.  Sexual activity.  Eating habits.  History of falls.  Memory and ability to understand (cognition).  Work and work Statistician.  Reproductive health. Screening  You may have the following tests or measurements:  Height, weight, and BMI.  Blood pressure.  Lipid and cholesterol levels. These may be checked every 5 years, or more frequently if you are over 70 years old.  Skin check.  Lung cancer screening. You may have this screening every year starting at age 65 if you have a 30-pack-year history of smoking and currently smoke or have quit within the past 15 years.  Fecal occult blood test (FOBT) of the stool. You may have this test every year starting at age 8.  Flexible sigmoidoscopy or colonoscopy. You may have a sigmoidoscopy every 5 years or a colonoscopy every 10 years starting at age 63.  Hepatitis C blood test.  Hepatitis B blood test.  Sexually transmitted disease (STD) testing.  Diabetes screening. This is done by checking your blood sugar (glucose) after you have not eaten for a while (fasting). You may have this done every 1-3 years.  Bone density scan. This is done to screen for osteoporosis. You may have this done starting at age 17.  Mammogram. This may be done every 1-2 years. Talk to your health care provider about how often you should have regular mammograms. Talk with your health care provider about your test results, treatment options, and if necessary, the need for more tests. Vaccines  Your health care provider may recommend certain vaccines, such as:  Influenza vaccine. This is recommended every year.  Tetanus, diphtheria, and acellular pertussis (Tdap, Td)  vaccine. You may need a Td booster every 10 years.  Zoster vaccine. You may need this after age 45.  Pneumococcal 13-valent conjugate (PCV13) vaccine. One dose  is recommended after age 3.  Pneumococcal polysaccharide (PPSV23) vaccine. One dose is recommended after age 36. Talk to your health care provider about which screenings and vaccines you need and how often you need them. This information is not intended to replace advice given to you by your health care provider. Make sure you discuss any questions you have with your health care provider. Document Released: 11/19/2015 Document Revised: 07/12/2016 Document Reviewed: 08/24/2015 Elsevier Interactive Patient Education  2017 [place] Prevention in the Home Falls can cause injuries. They can happen to people of all ages. There are many things you can do to make your home safe and to help prevent falls. What can I do on the outside of my home?  Regularly fix the edges of walkways and driveways and fix any cracks.  Remove anything that might make you trip as you walk through a door, such as a raised step or threshold.  Trim any bushes or trees on the path to your home.  Use bright outdoor lighting.  Clear any walking paths of anything that might make someone trip, such as rocks or tools.  Regularly check to see if handrails are loose or broken. Make sure that both sides of any steps have handrails.  Any raised decks and porches should have guardrails on the edges.  Have any leaves, snow, or ice cleared regularly.  Use sand or salt on walking paths during winter.  Clean up any spills in your garage right away. This includes oil or grease spills. What can I do in the bathroom?  Use night lights.  Install grab bars by the toilet and in the tub and shower. Do not use towel bars as grab bars.  Use non-skid mats or decals in the tub or shower.  If you need to sit down in the shower, use a plastic, non-slip stool.  Keep the floor dry. Clean up any water that spills on the floor as soon as it happens.  Remove soap buildup in the tub or shower regularly.  Attach bath mats  securely with double-sided non-slip rug tape.  Do not have throw rugs and other things on the floor that can make you trip. What can I do in the bedroom?  Use night lights.  Make sure that you have a light by your bed that is easy to reach.  Do not use any sheets or blankets that are too big for your bed. They should not hang down onto the floor.  Have a firm chair that has side arms. You can use this for support while you get dressed.  Do not have throw rugs and other things on the floor that can make you trip. What can I do in the kitchen?  Clean up any spills right away.  Avoid walking on wet floors.  Keep items that you use a lot in easy-to-reach places.  If you need to reach something above you, use a strong step stool that has a grab bar.  Keep electrical cords out of the way.  Do not use floor polish or wax that makes floors slippery. If you must use wax, use non-skid floor wax.  Do not have throw rugs and other things on the floor that can make you trip. What can I do with my stairs?  Do not leave  any items on the stairs.  Make sure that there are handrails on both sides of the stairs and use them. Fix handrails that are broken or loose. Make sure that handrails are as long as the stairways.  Check any carpeting to make sure that it is firmly attached to the stairs. Fix any carpet that is loose or worn.  Avoid having throw rugs at the top or bottom of the stairs. If you do have throw rugs, attach them to the floor with carpet tape.  Make sure that you have a light switch at the top of the stairs and the bottom of the stairs. If you do not have them, ask someone to add them for you. What else can I do to help prevent falls?  Wear shoes that:  Do not have high heels.  Have rubber bottoms.  Are comfortable and fit you well.  Are closed at the toe. Do not wear sandals.  If you use a stepladder:  Make sure that it is fully opened. Do not climb a closed  stepladder.  Make sure that both sides of the stepladder are locked into place.  Ask someone to hold it for you, if possible.  Clearly mark and make sure that you can see:  Any grab bars or handrails.  First and last steps.  Where the edge of each step is.  Use tools that help you move around (mobility aids) if they are needed. These include:  Canes.  Walkers.  Scooters.  Crutches.  Turn on the lights when you go into a dark area. Replace any light bulbs as soon as they burn out.  Set up your furniture so you have a clear path. Avoid moving your furniture around.  If any of your floors are uneven, fix them.  If there are any pets around you, be aware of where they are.  Review your medicines with your doctor. Some medicines can make you feel dizzy. This can increase your chance of falling. Ask your doctor what other things that you can do to help prevent falls. This information is not intended to replace advice given to you by your health care provider. Make sure you discuss any questions you have with your health care provider. Document Released: 08/19/2009 Document Revised: 03/30/2016 Document Reviewed: 11/27/2014 Elsevier Interactive Patient Education  2017 [employer].

## 2021-02-08 NOTE — Progress Notes (Signed)
I connected with Pamela Ball today by telephone and verified that I am speaking with the correct person using two identifiers. Location patient: home Location provider: work Persons participating in the virtual visit: Thena, Devora LPN.   I discussed the limitations, risks, security and privacy concerns of performing an evaluation and management service by telephone and the availability of in person appointments. I also discussed with the patient that there may be a patient responsible charge related to this service. The patient expressed understanding and verbally consented to this telephonic visit.    Interactive audio and video telecommunications were attempted between this provider and patient, however failed, due to patient having technical difficulties OR patient did not have access to video capability.  We continued and completed visit with audio only.     Vital signs may be patient reported or missing.  Subjective:   Pamela Ball is a 75 y.o. female who presents for Medicare Annual (Subsequent) preventive examination.  Review of Systems     Cardiac Risk Factors include: advanced age (>60men, >32 women);dyslipidemia;hypertension;sedentary lifestyle     Objective:    Today's Vitals   02/08/21 1321  Weight: 124 lb (56.2 kg)  Height: 4\' 10"  (1.473 m)   Body mass index is 25.92 kg/m.  Advanced Directives 02/08/2021 12/30/2020 12/29/2020 05/29/2020 03/01/2020 08/07/2019 07/25/2019  Does Patient Have a Medical Advance Directive? No No No No No No No  Would patient like information on creating a medical advance directive? - No - Patient declined No - Patient declined - No - Patient declined No - Patient declined No - Patient declined    Current Medications (verified) Outpatient Encounter Medications as of 02/08/2021  Medication Sig  . albuterol (VENTOLIN HFA) 108 (90 Base) MCG/ACT inhaler INHALE 2 PUFFS INTO THE LUNGS EVERY 4 HOURS AS NEEDED FOR WHEEZING OR  SHORTNESS OF BREATH (COUGH) (Patient taking differently: Inhale 1-2 puffs into the lungs every 4 (four) hours as needed for shortness of breath or wheezing.)  . amLODipine (NORVASC) 10 MG tablet Take 1 tablet (10 mg total) by mouth daily.  Marland Kitchen buPROPion (WELLBUTRIN XL) 150 MG 24 hr tablet Take 150 mg by mouth daily.  . busPIRone (BUSPAR) 10 MG tablet Take 10 mg by mouth daily. Prescribed by Psychiatry Dr Kasandra Knudsen  . Cholecalciferol (VITAMIN D3) 50 MCG (2000 UT) TABS Take 2,000 Units by mouth daily.  . Cyanocobalamin (VITAMIN B-12) 3000 MCG SUBL Take 3,000 mcg by mouth daily.  . Ferrous Fumarate (IRON) 18 MG TBCR Take 18 mg by mouth daily. W/Rice Protein  . ibuprofen (ADVIL) 600 MG tablet TAKE 1 TABLET BY MOUTH EVERY 8 HOURS AS NEEDED.  . Multiple Vitamin (MULTIVITAMIN WITH MINERALS) TABS tablet Take 1 tablet by mouth daily.  . Multiple Vitamins-Minerals (PRESERVISION AREDS) TABS Take 1 tablet by mouth daily.  Marland Kitchen OLANZapine (ZYPREXA) 7.5 MG tablet Take 7.5 mg by mouth daily.  Marland Kitchen omeprazole (PRILOSEC) 20 MG capsule TAKE 1 CAPSULE (20 MG TOTAL) BY MOUTH DAILY BEFORE BREAKFAST.  Marland Kitchen ondansetron (ZOFRAN-ODT) 4 MG disintegrating tablet Take 1 tablet (4 mg total) by mouth every 8 (eight) hours as needed for nausea or vomiting.  Marland Kitchen oxyCODONE (OXY IR/ROXICODONE) 5 MG immediate release tablet Take 1 tablet (5 mg total) by mouth every 4 (four) hours as needed.  . simvastatin (ZOCOR) 20 MG tablet TAKE 1 TABLET BY MOUTH EVERY DAY IN THE MORNING (Patient taking differently: Take 20 mg by mouth daily.)  . venlafaxine XR (EFFEXOR-XR) 75 MG 24 hr capsule  Take 75 mg by mouth daily with breakfast.   . vitamin C (ASCORBIC ACID) 500 MG tablet Take 500 mg by mouth daily.   No facility-administered encounter medications on file as of 02/08/2021.    Allergies (verified) Patient has no known allergies.   History: Past Medical History:  Diagnosis Date  . Abdominal hernia with obstruction and without gangrene   . Anxiety  02/15/2017   denies  . Asthma   . Bipolar 1 disorder, depressed, full remission ([place]) 02/15/2017  . Bipolar affective disorder ([place])   . COPD (chronic obstructive pulmonary disease) ([place])   . Depression   . GERD (gastroesophageal reflux disease) 02/15/2017  . Glaucoma   . Headache   . Hiatal hernia   . History of abdominal hernia 05/21/2017  . History of compression fracture of spine 02/15/2017  . Hyperlipidemia   . Hypertension   . Incisional hernia   . Incisional ventral hernia w obstruction 06/02/2017  . Normocytic anemia 05/23/2017  . 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 Presbycusis of both ears 02/15/2017   Past Surgical History:  Procedure Laterality Date  . ABDOMINAL HYSTERECTOMY  11/06/2006  . APPENDECTOMY    . BREAST BIOPSY Right 12/14/2020   stereo bx of distortion/asymmetry, coil marker, path pending  . BREAST EXCISIONAL BIOPSY Left    age 62- benign   . CESAREAN SECTION     x3  . COLONOSCOPY WITH PROPOFOL N/A 06/25/2018   Procedure: COLONOSCOPY WITH PROPOFOL;  Surgeon: Jonathon Bellows, MD;  Location: [facility] ENDOSCOPY;  Service: Endoscopy;  Laterality: N/A;  . KYPHOPLASTY    . KYPHOPLASTY N/A 09/23/2018   Procedure: KYPHOPLASTY T10;  Surgeon: Hessie Knows, MD;  Location: [facility] ORS;  Service: Orthopedics;  Laterality: N/A;  . KYPHOPLASTY N/A 03/11/2020   Procedure: L3 KYPHOPLASTY;  Surgeon: Hessie Knows, MD;  Location: [facility] ORS;  Service: Orthopedics;  Laterality: N/A;  . KYPHOPLASTY N/A 12/30/2020   Procedure: L5 KYPHOPLASTY;  Surgeon: Hessie Knows, MD;  Location: [facility] ORS;  Service: Orthopedics;  Laterality: N/A;  . ORIF HUMERUS FRACTURE Left 08/07/2019   Procedure: OPEN REDUCTION INTERNAL FIXATION (ORIF) LEFT DISTAL HUMERUS FRACTURE;  Surgeon: Altamese Red Rock, MD;  Location: [facility];  Service: Orthopedics;  Laterality: Left;  Marland Kitchen VENTRAL HERNIA REPAIR N/A 06/02/2017   Procedure: exploratory laparotomy repair of incarcerated  VENTRAL hernia;  Surgeon: Florene Glen, MD;   Location: [facility] ORS;  Service: General;  Laterality: N/A;  . VENTRAL HERNIA REPAIR N/A 08/29/2018   Procedure: LAPAROSCOPIC INCISIONAL HERNIA;  Surgeon: Olean Ree, MD;  Location: [facility] ORS;  Service: General;  Laterality: N/A;   Family History  Problem Relation Age of Onset  . Cancer Mother        cancer  . Breast cancer Mother 66   Social History   Socioeconomic History  . Marital status: Married    Spouse name: Dolora Ridgely  . Number of children: Not on file  . Years of education: [employer]  . Highest education level: Not on file  Occupational History  . Occupation: retired  Tobacco Use  . Smoking status: Never Smoker  . Smokeless tobacco: Never Used  Vaping Use  . Vaping Use: Never used  Substance and Sexual Activity  . Alcohol use: No  . Drug use: No  . Sexual activity: Not Currently  Other Topics Concern  . Not on file  Social History Narrative  . Not on file   Social Determinants of Health   Financial Resource Strain: Low Risk   .  Difficulty of Paying Living Expenses: Not hard at all  Food Insecurity: No Food Insecurity  . Worried About Charity fundraiser in the Last Year: Never true  . Ran Out of Food in the Last Year: Never true  Transportation Needs: No Transportation Needs  . Lack of Transportation (Medical): No  . Lack of Transportation (Non-Medical): No  Physical Activity: Inactive  . Days of Exercise per Week: 0 days  . Minutes of Exercise per Session: 0 min  Stress: No Stress Concern Present  . Feeling of Stress : Not at all  Social Connections: Not on file    Tobacco Counseling Counseling given: Not Answered   Clinical Intake:  Pre-visit preparation completed: Yes  Pain : No/denies pain     Nutritional Status: BMI 25 -29 Overweight Nutritional Risks: Nausea/ vomitting/ diarrhea (nausea this morning, but has subsided) Diabetes: No  How often do you need to have someone help you when you read instructions, pamphlets, or other  written materials from your doctor or pharmacy?: 1 - Never What is the last grade level you completed in school?: 12th grade  Diabetic? no  Interpreter Needed?: No  Information entered by :: NAllen LPN   Activities of Daily Living In your present state of health, do you have any difficulty performing the following activities: 02/08/2021 12/29/2020  Hearing? Y N  Comment has hearing aides, but does not wear them often -  Vision? Y Y  Comment hard to see close up -  Difficulty concentrating or making decisions? N N  Walking or climbing stairs? N Y  Dressing or bathing? Y N  Comment need trouble getting out of bathtub -  Doing errands, shopping? Tempie Donning  Comment husband brings -  Conservation officer, nature and eating ? N -  Using the Toilet? N -  In the past six months, have you accidently leaked urine? N -  Do you have problems with loss of bowel control? N -  Managing your Medications? Y -  Comment husband manages -  Managing your Finances? N -  Housekeeping or managing your Housekeeping? N -  Some recent data might be hidden    Patient Care Team: Olin Hauser, DO as PCP - General (Family Medicine) Myer Haff, MD as Referring Physician (Psychiatry)  Indicate any recent Medical Services you may have received from other than [facility] providers in the past year (date may be approximate).     Assessment:   This is a routine wellness examination for Pamela Ball.  Hearing/Vision screen No exam data present  Dietary issues and exercise activities discussed: Current Exercise Habits: The patient does not participate in regular exercise at present  Goals    . DIET - INCREASE WATER INTAKE     Recommend drinking at least 6-8 glasses of water a day     . Patient Stated     02/08/2021, get off some medications      Depression Screen PHQ 2/9 Scores 02/08/2021 06/28/2020 06/01/2020 10/22/2019 07/01/2019 01/02/2019 10/21/2018  PHQ - 2 Score 0 0 0 6 0 6 0  PHQ- 9 Score - 5 6 14  - 18 -  Exception  Documentation - - - - - Patient refusal -    Fall Risk Fall Risk  02/08/2021 06/28/2020 07/01/2019 11/19/2018 10/21/2018  Falls in the past year? 1 1 1  0 0  Comment - - nov 2019 - -  Number falls in past yr: 0 1 0 - -  Injury with Fall? 0 1 1 - -  Risk for fall due to : Medication side effect - - - -  Follow up Falls evaluation completed;Education provided;Falls prevention discussed Falls evaluation completed Education provided;Falls prevention discussed - Falls evaluation completed    FALL RISK PREVENTION PERTAINING TO THE HOME:  Any stairs in or around the home? No  If so, are there any without handrails? n/a Home free of loose throw rugs in walkways, pet beds, electrical cords, etc? Yes  Adequate lighting in your home to reduce risk of falls? Yes   ASSISTIVE DEVICES UTILIZED TO PREVENT FALLS:  Life alert? No  Use of a cane, walker or w/c? No  Grab bars in the bathroom? No  Shower chair or bench in shower? No  Elevated toilet seat or a handicapped toilet? Yes   TIMED UP AND GO:  Was the test performed? No   Cognitive Function:     6CIT Screen 02/08/2021 05/14/2018  What Year? 0 points 0 points  What month? 0 points 0 points  What time? 0 points 0 points  Count back from 20 0 points 0 points  Months in reverse 4 points 0 points  Repeat phrase 10 points 2 points  Total Score 14 2    Immunizations Immunization History  Administered Date(s) Administered  . Fluad Quad(high Dose 65+) 11/16/2020  . Influenza, High Dose Seasonal PF 08/13/2017, 12/27/2018  . Moderna Sars-Covid-2 Vaccination 06/24/2020, 07/27/2020  . Pneumococcal Conjugate-13 05/21/2017  . Tdap 11/14/2017    TDAP status: Up to date  Flu Vaccine status: Up to date  Pneumococcal vaccine status: Due, Education has been provided regarding the importance of this vaccine. Advised may receive this vaccine at local pharmacy or Health Dept. Aware to provide a copy of the vaccination record if obtained from local  pharmacy or Health Dept. Verbalized acceptance and understanding.  Covid-19 vaccine status: Completed vaccines  Qualifies for Shingles Vaccine? Yes   Zostavax completed No   Shingrix Completed?: No.    Education has been provided regarding the importance of this vaccine. Patient has been advised to call insurance company to determine out of pocket expense if they have not yet received this vaccine. Advised may also receive vaccine at local pharmacy or Health Dept. Verbalized acceptance and understanding.  Screening Tests Health Maintenance  Topic Date Due  . COVID-19 Vaccine (3 - Moderna risk 4-dose series) 08/24/2020  . PNA vac Low Risk Adult (2 of 2 - PPSV23) 06/28/2021 (Originally 05/21/2018)  . INFLUENZA VACCINE  06/06/2021  . COLONOSCOPY (Pts 45-65yrs Insurance coverage will need to be confirmed)  06/25/2021  . TETANUS/TDAP  11/15/2027  . DEXA SCAN  Completed  . Hepatitis C Screening  Completed  . HPV VACCINES  Aged Out    Health Maintenance  Health Maintenance Due  Topic Date Due  . COVID-19 Vaccine (3 - Moderna risk 4-dose series) 08/24/2020    Colorectal cancer screening: Type of screening: Colonoscopy. Completed 06/25/2018. Repeat every 3 years  Mammogram status: Completed 12/14/2020. Repeat every year  Bone Density status: Completed 12/30/2018.   Lung Cancer Screening: (Low Dose CT Chest recommended if Age 49-80 years, 30 pack-year currently smoking OR have quit w/in 15years.) does not qualify.   Lung Cancer Screening Referral: no  Additional Screening:  Hepatitis C Screening: does qualify; Completed 05/21/2017  Vision Screening: Recommended annual ophthalmology exams for early detection of glaucoma and other disorders of the eye. Is the patient up to date with their annual eye exam?  Yes  Who is the provider or what is  the name of the office in which the patient attends annual eye exams? [facility] If pt is not established with a provider, would they like to  be referred to a provider to establish care? No .   Dental Screening: Recommended annual dental exams for proper oral hygiene  Community Resource Referral / Chronic Care Management: CRR required this visit?  No   CCM required this visit?  No      Plan:     I have personally reviewed and noted the following in the patient's chart:   . Medical and social history . Use of alcohol, tobacco or illicit drugs  . Current medications and supplements . Functional ability and status . Nutritional status . Physical activity . Advanced directives . List of other physicians . Hospitalizations, surgeries, and ER visits in previous 12 months . Vitals . Screenings to include cognitive, depression, and falls . Referrals and appointments  In addition, I have reviewed and discussed with patient certain preventive protocols, quality metrics, and best practice recommendations. A written personalized care plan for preventive services as well as general preventive health recommendations were provided to patient.     Kellie Simmering, LPN   04/09/6802   Nurse Notes:

## 2021-02-09 ENCOUNTER — Encounter: Payer: Self-pay | Admitting: Family Medicine

## 2021-02-09 ENCOUNTER — Ambulatory Visit (INDEPENDENT_AMBULATORY_CARE_PROVIDER_SITE_OTHER): Payer: Medicare Other | Admitting: Family Medicine

## 2021-02-09 ENCOUNTER — Other Ambulatory Visit: Payer: Self-pay

## 2021-02-09 VITALS — BP 149/60 | HR 92 | Ht <= 58 in | Wt 123.2 lb

## 2021-02-09 DIAGNOSIS — B379 Candidiasis, unspecified: Secondary | ICD-10-CM | POA: Diagnosis not present

## 2021-02-09 DIAGNOSIS — N3001 Acute cystitis with hematuria: Secondary | ICD-10-CM

## 2021-02-09 DIAGNOSIS — T3695XA Adverse effect of unspecified systemic antibiotic, initial encounter: Secondary | ICD-10-CM

## 2021-02-09 DIAGNOSIS — R350 Frequency of micturition: Secondary | ICD-10-CM | POA: Diagnosis not present

## 2021-02-09 LAB — POCT URINALYSIS DIPSTICK
Bilirubin, UA: NEGATIVE
Glucose, UA: NEGATIVE
Ketones, UA: NEGATIVE
Nitrite, UA: POSITIVE
Protein, UA: NEGATIVE
Spec Grav, UA: 1.015 (ref 1.010–1.025)
Urobilinogen, UA: 0.2 E.U./dL
pH, UA: 7 (ref 5.0–8.0)

## 2021-02-09 MED ORDER — FLUCONAZOLE 150 MG PO TABS: ORAL_TABLET | ORAL | 0 refills | Status: DC

## 2021-02-09 MED ORDER — CEPHALEXIN 500 MG PO CAPS: 500.0000 mg | ORAL_CAPSULE | Freq: Three times a day (TID) | ORAL | 0 refills | Status: DC

## 2021-02-09 NOTE — Patient Instructions (Addendum)
Thank you for coming to the office today.  1. You have a Urinary Tract Infection - this is very common, your symptoms are reassuring and you should get better within 1 week on the antibiotics - Start Keflex 500mg  3 times daily for next 7 days, complete entire course, even if feeling better - We sent urine for a culture, we will call you within next few days if we need to change antibiotics - Please drink plenty of fluids, improve hydration over next 1 week  If symptoms worsening, developing nausea / vomiting, worsening back pain, fevers / chills / sweats, then please return for re-evaluation sooner.  If you take AZO OTC - limit this to 2-3 days MAX to avoid affecting kidneys  Take yeast pill diflucan 1 pill after finish antibiotic then skip 1 day then take final pill on day 3   Please schedule a Follow-up Appointment to: Return if symptoms worsen or fail to improve.  If you have any other questions or concerns, please feel free to call the office or send a message through [facility]. You may also schedule an earlier appointment if necessary.  Additionally, you may be receiving a survey about your experience at our office within a few days to 1 week by e-mail or mail. We value your feedback.  [name], DO [place]

## 2021-02-09 NOTE — Progress Notes (Signed)
Subjective:    Patient ID: Pamela Ball, female    DOB: Jul 11, 1946, 75 y.o.   MRN: [id number]  Pamela Ball is a 75 y.o. female presenting on 02/09/2021 for Urinary Frequency and Back Pain   HPI  UTI, Urinary Frequency Reports new onset in past few days with urinary frequency up to 10 times, dysuria burning with urination. Similar to last UTI. Admits some low back pain across, known chronic back pain. Admits nausea Denies any hematuria, fever chills    Depression screen [facility] 2/9 02/08/2021 06/28/2020 06/01/2020  Decreased Interest 0 0 0  Down, Depressed, Hopeless 0 0 0  PHQ - 2 Score 0 0 0  Altered sleeping - 2 1  Tired, decreased energy - 3 2  Change in appetite - 0 0  Feeling bad or failure about yourself  - 0 0  Trouble concentrating - 0 2  Moving slowly or fidgety/restless - 0 1  Suicidal thoughts - 0 0  PHQ-9 Score - 5 6  Difficult doing work/chores - Not difficult at all Not difficult at all  Some recent data might be hidden    Social History   Tobacco Use  . Smoking status: Never Smoker  . Smokeless tobacco: Never Used  Vaping Use  . Vaping Use: Never used  Substance Use Topics  . Alcohol use: No  . Drug use: No    Review of Systems Per HPI unless specifically indicated above     Objective:    BP (!) 149/60   Pulse 92   Ht 4\' 10"  (1.473 m)   Wt 123 lb 3.2 oz (55.9 kg)   SpO2 93%   BMI 25.75 kg/m   Wt Readings from Last 3 Encounters:  02/09/21 123 lb 3.2 oz (55.9 kg)  02/08/21 124 lb (56.2 kg)  12/29/20 120 lb (54.4 kg)    Physical Exam Vitals and nursing note reviewed.  Constitutional:      General: She is not in acute distress.    Appearance: She is well-developed. She is not diaphoretic.     Comments: Well-appearing, comfortable, cooperative  HENT:     Head: Normocephalic and atraumatic.  Eyes:     General:        Right eye: No discharge.        Left eye: No discharge.     Conjunctiva/sclera: Conjunctivae normal.  Cardiovascular:      Rate and Rhythm: Normal rate.  Pulmonary:     Effort: Pulmonary effort is normal.  Skin:    General: Skin is warm and dry.     Findings: No erythema or rash.  Neurological:     Mental Status: She is alert and oriented to person, place, and time.  Psychiatric:        Behavior: Behavior normal.     Comments: Well groomed, good eye contact, normal speech and thoughts    Results for orders placed or performed in visit on 02/09/21  POCT Urinalysis Dipstick  Result Value Ref Range   Color, UA Yellow    Clarity, UA clear    Glucose, UA Negative Negative   Bilirubin, UA Negative    Ketones, UA Negative    Spec Grav, UA 1.015 1.010 - 1.025   Blood, UA Trace    pH, UA 7.0 5.0 - 8.0   Protein, UA Negative Negative   Urobilinogen, UA 0.2 0.2 or 1.0 E.U./dL   Nitrite, UA Positive    Leukocytes, UA Small (1+) (A) Negative  Appearance     Odor        Assessment & Plan:   Problem List Items Addressed This Visit   None   Visit Diagnoses    Acute cystitis with hematuria    -  Primary   Relevant Medications   cephALEXin (KEFLEX) 500 MG capsule   Urinary frequency       Relevant Orders   POCT Urinalysis Dipstick (Completed)   Urine Culture   Antibiotic-induced yeast infection       Relevant Medications   cephALEXin (KEFLEX) 500 MG capsule   fluconazole (DIFLUCAN) 150 MG tablet       Clinically consistent with UTI and confirmed on UA. Last UTI 07/2020 resolved  No concern for pyelo today (no systemic symptoms, neg fever, n/v).  Plan: 1. UA dipstick suggestive of UTI today 2. Ordered Urine culture 3. Keflex 500mg  TID x 7 days - Add diflucan if antibiotic induced yeast infection 4. Improve PO hydration 5. RTC if no improvement 1-2 weeks, red flags given to return sooner   Meds ordered this encounter  Medications  . cephALEXin (KEFLEX) 500 MG capsule    Sig: Take 1 capsule (500 mg total) by mouth 3 (three) times daily. For 7 days    Dispense:  21 capsule     Refill:  0  . fluconazole (DIFLUCAN) 150 MG tablet    Sig: Take one tablet by mouth on Day 1. Repeat dose 2nd tablet on Day 3.    Dispense:  2 tablet    Refill:  0     Follow up plan: Return if symptoms worsen or fail to improve.   [name], [facility] 02/09/2021, 11:39 AM

## 2021-02-11 DIAGNOSIS — E559 Vitamin D deficiency, unspecified: Secondary | ICD-10-CM | POA: Diagnosis not present

## 2021-02-11 DIAGNOSIS — M810 Age-related osteoporosis without current pathological fracture: Secondary | ICD-10-CM | POA: Diagnosis not present

## 2021-02-12 LAB — URINE CULTURE
MICRO NUMBER:: 11739028
SPECIMEN QUALITY:: ADEQUATE

## 2021-02-17 DIAGNOSIS — M810 Age-related osteoporosis without current pathological fracture: Secondary | ICD-10-CM | POA: Diagnosis not present

## 2021-02-22 DIAGNOSIS — M810 Age-related osteoporosis without current pathological fracture: Secondary | ICD-10-CM | POA: Diagnosis not present

## 2021-02-22 DIAGNOSIS — E559 Vitamin D deficiency, unspecified: Secondary | ICD-10-CM | POA: Diagnosis not present

## 2021-03-08 ENCOUNTER — Other Ambulatory Visit: Payer: Self-pay

## 2021-03-08 ENCOUNTER — Encounter: Payer: Self-pay | Admitting: Oncology

## 2021-03-08 ENCOUNTER — Inpatient Hospital Stay: Payer: Medicare Other | Attending: Oncology | Admitting: Oncology

## 2021-03-08 ENCOUNTER — Inpatient Hospital Stay: Payer: Medicare Other

## 2021-03-08 VITALS — BP 145/62 | HR 86 | Temp 98.6°F | Resp 18 | Ht <= 58 in | Wt 122.6 lb

## 2021-03-08 DIAGNOSIS — R778 Other specified abnormalities of plasma proteins: Secondary | ICD-10-CM

## 2021-03-08 DIAGNOSIS — D7589 Other specified diseases of blood and blood-forming organs: Secondary | ICD-10-CM

## 2021-03-08 DIAGNOSIS — Z79899 Other long term (current) drug therapy: Secondary | ICD-10-CM | POA: Diagnosis not present

## 2021-03-08 DIAGNOSIS — Z8249 Family history of ischemic heart disease and other diseases of the circulatory system: Secondary | ICD-10-CM | POA: Diagnosis not present

## 2021-03-08 DIAGNOSIS — D751 Secondary polycythemia: Secondary | ICD-10-CM | POA: Diagnosis not present

## 2021-03-08 DIAGNOSIS — Z803 Family history of malignant neoplasm of breast: Secondary | ICD-10-CM | POA: Insufficient documentation

## 2021-03-08 DIAGNOSIS — D472 Monoclonal gammopathy: Secondary | ICD-10-CM | POA: Insufficient documentation

## 2021-03-08 LAB — CBC WITH DIFFERENTIAL/PLATELET
Abs Immature Granulocytes: 0.01 10*3/uL (ref 0.00–0.07)
Basophils Absolute: 0 10*3/uL (ref 0.0–0.1)
Basophils Relative: 1 %
Eosinophils Absolute: 0.1 10*3/uL (ref 0.0–0.5)
Eosinophils Relative: 1 %
HCT: 49.8 % — ABNORMAL HIGH (ref 36.0–46.0)
Hemoglobin: 16.7 g/dL — ABNORMAL HIGH (ref 12.0–15.0)
Immature Granulocytes: 0 %
Lymphocytes Relative: 23 %
Lymphs Abs: 1.1 10*3/uL (ref 0.7–4.0)
MCH: 33.5 pg (ref 26.0–34.0)
MCHC: 33.5 g/dL (ref 30.0–36.0)
MCV: 99.8 fL (ref 80.0–100.0)
Monocytes Absolute: 0.6 10*3/uL (ref 0.1–1.0)
Monocytes Relative: 13 %
Neutro Abs: 3.1 10*3/uL (ref 1.7–7.7)
Neutrophils Relative %: 62 %
Platelets: 253 10*3/uL (ref 150–400)
RBC: 4.99 MIL/uL (ref 3.87–5.11)
RDW: 12 % (ref 11.5–15.5)
WBC: 5 10*3/uL (ref 4.0–10.5)
nRBC: 0 % (ref 0.0–0.2)

## 2021-03-08 LAB — COMPREHENSIVE METABOLIC PANEL
ALT: 22 U/L (ref 0–44)
AST: 29 U/L (ref 15–41)
Albumin: 4.3 g/dL (ref 3.5–5.0)
Alkaline Phosphatase: 117 U/L (ref 38–126)
Anion gap: 14 (ref 5–15)
BUN: 12 mg/dL (ref 8–23)
CO2: 27 mmol/L (ref 22–32)
Calcium: 9.3 mg/dL (ref 8.9–10.3)
Chloride: 98 mmol/L (ref 98–111)
Creatinine, Ser: 0.53 mg/dL (ref 0.44–1.00)
GFR, Estimated: >60 mL/min (ref >60)
Glucose, Bld: 104 mg/dL — ABNORMAL HIGH (ref 70–99)
Potassium: 3.8 mmol/L (ref 3.5–5.1)
Sodium: 139 mmol/L (ref 135–145)
Total Bilirubin: 0.6 mg/dL (ref 0.3–1.2)
Total Protein: 7.4 g/dL (ref 6.5–8.1)

## 2021-03-08 LAB — PATHOLOGIST SMEAR REVIEW

## 2021-03-08 LAB — VITAMIN B12: Vitamin B-12: 3006 pg/mL — ABNORMAL HIGH (ref 180–914)

## 2021-03-08 LAB — LACTATE DEHYDROGENASE: LDH: 166 U/L (ref 98–192)

## 2021-03-08 LAB — FOLATE: Folate: >100 ng/mL (ref >5.9)

## 2021-03-08 NOTE — Progress Notes (Signed)
Pt here to establish care.

## 2021-03-08 NOTE — Progress Notes (Signed)
Hematology/Oncology Consult note [facility] [phone] [phone]   Patient Care Team: Olin Hauser, DO as PCP - General (Family Medicine) Myer Haff, MD as Referring Physician (Psychiatry)  REFERRING PROVIDER: [name], NP Dr. Honor Junes  CHIEF COMPLAINTS/REASON FOR VISIT:  Evaluation of abnormal SPEP  HISTORY OF PRESENTING ILLNESS:  Pamela Ball is a 75 y.o. female who was seen in consultation at the request of [name], NP for evaluation of abnormal SPEP results.   Patient recently had work up done at endocrinology office. Labs reviewed,  02/22/2021 SPEP showed 0.3g/dL M spike,  and urine IFE appears unremarkable.  No aggravating or alleviated factors.  Patient has osteoporosis.  She takes calcium supplementation and vitamin D supplementation. Denies weight loss, fever chills, bone pain, neuropathy.  Patient reports chronic back problems status post surgeries.   Review of Systems  Constitutional: Negative for appetite change, chills, fatigue and fever.  HENT:   Negative for hearing loss and voice change.        Chronic vision problem.  Eyes: Negative for eye problems.  Respiratory: Negative for chest tightness and cough.   Cardiovascular: Negative for chest pain.  Gastrointestinal: Negative for abdominal distention, abdominal pain and blood in stool.  Endocrine: Negative for hot flashes.  Genitourinary: Negative for difficulty urinating and frequency.   Musculoskeletal:       Multiple kyphoplasty surgeries.  No new bone pain  Skin: Negative for itching and rash.  Neurological: Negative for extremity weakness.  Hematological: Negative for adenopathy.  Psychiatric/Behavioral: Negative for confusion.     MEDICAL HISTORY:  Past Medical History:  Diagnosis Date  . Abdominal hernia with obstruction and without gangrene   . Anxiety 02/15/2017   denies  . Asthma   . Bipolar 1 disorder, depressed,  full remission ([place]) 02/15/2017  . Bipolar affective disorder ([place])   . COPD (chronic obstructive pulmonary disease) ([place])   . Depression   . GERD (gastroesophageal reflux disease) 02/15/2017  . Glaucoma   . Headache   . Hiatal hernia   . History of abdominal hernia 05/21/2017  . History of compression fracture of spine 02/15/2017  . Hyperlipidemia   . Hypertension   . Incisional hernia   . Incisional ventral hernia w obstruction 06/02/2017  . Normocytic anemia 05/23/2017  . 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 Presbycusis of both ears 02/15/2017    SURGICAL HISTORY: Past Surgical History:  Procedure Laterality Date  . ABDOMINAL HYSTERECTOMY  11/06/2006  . APPENDECTOMY    . BREAST BIOPSY Right 12/14/2020   stereo bx of distortion/asymmetry, coil marker, path pending  . BREAST EXCISIONAL BIOPSY Left    age 68- benign   . CESAREAN SECTION     x3  . COLONOSCOPY WITH PROPOFOL N/A 06/25/2018   Procedure: COLONOSCOPY WITH PROPOFOL;  Surgeon: Jonathon Bellows, MD;  Location[facility] ENDOSCOPY;  Service: Endoscopy;  Laterality: N/A;  . KYPHOPLASTY    . KYPHOPLASTY N/A 09/23/2018   Procedure: KYPHOPLASTY T10;  Surgeon: Hessie Knows, MD;  Location: [facility] ORS;  Service: Orthopedics;  Laterality: N/A;  . KYPHOPLASTY N/A 03/11/2020   Procedure: L3 KYPHOPLASTY;  Surgeon: Hessie Knows, MD;  Location: [facility] ORS;  Service: Orthopedics;  Laterality: N/A;  . KYPHOPLASTY N/A 12/30/2020   Procedure: L5 KYPHOPLASTY;  Surgeon: Hessie Knows, MD;  Location: [facility] ORS;  Service: Orthopedics;  Laterality: N/A;  . ORIF HUMERUS FRACTURE Left 08/07/2019   Procedure: OPEN REDUCTION INTERNAL FIXATION (ORIF) LEFT DISTAL HUMERUS FRACTURE;  Surgeon: Marcelino Scot,  Legrand Como, MD;  Location: [place];  Service: Orthopedics;  Laterality: Left;  Marland Kitchen VENTRAL HERNIA REPAIR N/A 06/02/2017   Procedure: exploratory laparotomy repair of incarcerated  VENTRAL hernia;  Surgeon: Florene Glen, MD;  Location: [facility] ORS;  Service: General;   Laterality: N/A;  . VENTRAL HERNIA REPAIR N/A 08/29/2018   Procedure: LAPAROSCOPIC INCISIONAL HERNIA;  Surgeon: Olean Ree, MD;  Location: [facility] ORS;  Service: General;  Laterality: N/A;    SOCIAL HISTORY: Social History   Socioeconomic History  . Marital status: Married    Spouse name: Pamela Ball  . Number of children: Not on file  . Years of education: [employer]  . Highest education level: Not on file  Occupational History  . Occupation: retired  Tobacco Use  . Smoking status: Never Smoker  . Smokeless tobacco: Never Used  Vaping Use  . Vaping Use: Never used  Substance and Sexual Activity  . Alcohol use: No    Comment: drink occasionally   . Drug use: No  . Sexual activity: Not Currently  Other Topics Concern  . Not on file  Social History Narrative  . Not on file   Social Determinants of Health   Financial Resource Strain: Low Risk   . Difficulty of Paying Living Expenses: Not hard at all  Food Insecurity: No Food Insecurity  . Worried About Charity fundraiser in the Last Year: Never true  . Ran Out of Food in the Last Year: Never true  Transportation Needs: No Transportation Needs  . Lack of Transportation (Medical): No  . Lack of Transportation (Non-Medical): No  Physical Activity: Inactive  . Days of Exercise per Week: 0 days  . Minutes of Exercise per Session: 0 min  Stress: No Stress Concern Present  . Feeling of Stress : Not at all  Social Connections: Not on file  Intimate Partner Violence: Not on file    FAMILY HISTORY: Family History  Problem Relation Age of Onset  . Cancer Mother        cancer  . Breast cancer Mother 7  . Hypertension Father     ALLERGIES:  has No Known Allergies.  MEDICATIONS:  Current Outpatient Medications  Medication Sig Dispense Refill  . albuterol (VENTOLIN HFA) 108 (90 Base) MCG/ACT inhaler INHALE 2 PUFFS INTO THE LUNGS EVERY 4 HOURS AS NEEDED FOR WHEEZING OR SHORTNESS OF BREATH (COUGH) (Patient taking  differently: Inhale 1-2 puffs into the lungs every 4 (four) hours as needed for shortness of breath or wheezing.) 8.5 g 1  . alendronate (FOSAMAX) 70 MG tablet Take by mouth. Take 1 tablet (70 mg total) by mouth every 7 (seven) days Take with a full glass of water. Do not lie down for the next 30 min.    Marland Kitchen amLODipine (NORVASC) 10 MG tablet Take 1 tablet (10 mg total) by mouth daily. 90 tablet 3  . buPROPion (WELLBUTRIN XL) 150 MG 24 hr tablet Take 150 mg by mouth daily.    . busPIRone (BUSPAR) 10 MG tablet Take 10 mg by mouth daily. Prescribed by Psychiatry Dr Kasandra Knudsen    . Cholecalciferol (VITAMIN D3) 50 MCG (2000 UT) TABS Take 2,000 Units by mouth daily.    . Cyanocobalamin (VITAMIN B-12) 3000 MCG SUBL Take 3,000 mcg by mouth daily.    . Ferrous Fumarate (IRON) 18 MG TBCR Take 18 mg by mouth daily. W/Rice Protein    . ibuprofen (ADVIL) 600 MG tablet TAKE 1 TABLET BY MOUTH EVERY 8 HOURS AS NEEDED.  90 tablet 0  . Multiple Vitamin (MULTIVITAMIN WITH MINERALS) TABS tablet Take 1 tablet by mouth daily.    . Multiple Vitamins-Minerals (PRESERVISION AREDS) TABS Take 1 tablet by mouth daily.    Marland Kitchen OLANZapine (ZYPREXA) 7.5 MG tablet Take 7.5 mg by mouth daily.    Marland Kitchen omeprazole (PRILOSEC) 20 MG capsule TAKE 1 CAPSULE (20 MG TOTAL) BY MOUTH DAILY BEFORE BREAKFAST. 90 capsule 1  . ondansetron (ZOFRAN-ODT) 4 MG disintegrating tablet Take 1 tablet (4 mg total) by mouth every 8 (eight) hours as needed for nausea or vomiting. 30 tablet 2  . simvastatin (ZOCOR) 20 MG tablet TAKE 1 TABLET BY MOUTH EVERY DAY IN THE MORNING (Patient taking differently: Take 20 mg by mouth daily.) 90 tablet 1  . venlafaxine XR (EFFEXOR-XR) 75 MG 24 hr capsule Take 75 mg by mouth daily with breakfast.     . vitamin C (ASCORBIC ACID) 500 MG tablet Take 500 mg by mouth daily.    Marland Kitchen oxyCODONE (OXY IR/ROXICODONE) 5 MG immediate release tablet Take 1 tablet (5 mg total) by mouth every 4 (four) hours as needed. (Patient not taking: Reported on  03/08/2021) 30 tablet 0   No current facility-administered medications for this visit.     PHYSICAL EXAMINATION: ECOG PERFORMANCE STATUS: 1 - Symptomatic but completely ambulatory Vitals:   03/08/21 1110  BP: (!) 145/62  Pulse: 86  Resp: 18  Temp: 98.6 F (37 C)   Filed Weights   03/08/21 1110  Weight: 122 lb 9.6 oz (55.6 kg)    Physical Exam Constitutional:      General: She is not in acute distress.    Comments: Patient walks with a cane  HENT:     Head: Normocephalic and atraumatic.  Eyes:     General: No scleral icterus. Cardiovascular:     Rate and Rhythm: Normal rate and regular rhythm.     Heart sounds: Normal heart sounds.  Pulmonary:     Effort: Pulmonary effort is normal. No respiratory distress.     Breath sounds: No wheezing.  Abdominal:     General: Bowel sounds are normal. There is no distension.     Palpations: Abdomen is soft.  Musculoskeletal:        General: No deformity. Normal range of motion.     Cervical back: Normal range of motion and neck supple.  Skin:    General: Skin is warm and dry.     Findings: No erythema or rash.  Neurological:     Mental Status: She is alert and oriented to person, place, and time. Mental status is at baseline.     Cranial Nerves: No cranial nerve deficit.     Coordination: Coordination normal.  Psychiatric:        Mood and Affect: Mood normal.      LABORATORY DATA:  I have reviewed the data as listed Lab Results  Component Value Date   WBC 5.0 03/08/2021   HGB 16.7 (H) 03/08/2021   HCT 49.8 (H) 03/08/2021   MCV 99.8 03/08/2021   PLT 253 03/08/2021   Recent Labs    05/29/20 1914 06/28/20 0927 03/08/21 1205  NA 141 139 139  K 3.7 4.4 3.8  CL 102 102 98  CO2 '26 27 27  ' GLUCOSE 96 103* 104*  BUN '17 10 12  ' CREATININE 0.81 0.59* 0.53  CALCIUM 9.2 9.7 9.3  GFRNONAA >60 90 >60  GFRAA >60 105  --   PROT 6.8 6.4 7.4  ALBUMIN 4.0  --  4.3  AST '21 22 29  ' ALT '14 14 22  ' ALKPHOS 109  --  117  BILITOT  0.8 0.4 0.6   Iron/TIBC/Ferritin/ %Sat No results found for: IRON, TIBC, FERRITIN, IRONPCTSAT    RADIOGRAPHIC STUDIES: I have personally reviewed the radiological images as listed and agreed with the findings in the report.  No results found.   ASSESSMENT & PLAN:  1. Abnormal SPEP   2. Macrocytosis    Labs are reviewed and discussed with patient.  I recommend checking multiple myeloma panel [SPEP, IFE, light chain ratio]. Discussed with her about a possible diagnosis of MGUS.  Macrocytosis, check folate, B12, LDH, smear.  Check CBC and CMP.  Orders Placed This Encounter  Procedures  . CBC with Differential/Platelet    Standing Status:   Future    Number of Occurrences:   1    Standing Expiration Date:   03/08/2022  . Comprehensive metabolic panel    Standing Status:   Future    Number of Occurrences:   1    Standing Expiration Date:   03/08/2022  . Multiple Myeloma Panel (SPEP&IFE w/QIG)    Standing Status:   Future    Number of Occurrences:   1    Standing Expiration Date:   03/08/2022  . Kappa/lambda light chains    Standing Status:   Future    Number of Occurrences:   1    Standing Expiration Date:   03/08/2022  . Beta 2 microglobulin, serum    Standing Status:   Future    Number of Occurrences:   1    Standing Expiration Date:   03/08/2022  . Folate    Standing Status:   Future    Number of Occurrences:   1    Standing Expiration Date:   03/08/2022  . Vitamin B12    Standing Status:   Future    Number of Occurrences:   1    Standing Expiration Date:   03/08/2022  . Pathologist smear review    Standing Status:   Future    Number of Occurrences:   1    Standing Expiration Date:   03/08/2022  . Lactate dehydrogenase    Standing Status:   Future    Number of Occurrences:   1    Standing Expiration Date:   03/08/2022    All questions were answered. The patient knows to call the clinic with any problems questions or concerns.  Cc [name], NP  Return of visit: 2  weeks Thank you for this kind referral and the opportunity to participate in the care of this patient. A copy of today's note is routed to referring provider  Earlie Server, MD, PhD 03/08/2021

## 2021-03-09 LAB — KAPPA/LAMBDA LIGHT CHAINS
Kappa free light chain: 17.9 mg/L (ref 3.3–19.4)
Kappa, lambda light chain ratio: 0.9 (ref 0.26–1.65)
Lambda free light chains: 19.9 mg/L (ref 5.7–26.3)

## 2021-03-09 LAB — BETA 2 MICROGLOBULIN, SERUM: Beta-2 Microglobulin: 1.7 mg/L (ref 0.6–2.4)

## 2021-03-10 ENCOUNTER — Telehealth: Payer: Self-pay | Admitting: Family Medicine

## 2021-03-10 DIAGNOSIS — N3001 Acute cystitis with hematuria: Secondary | ICD-10-CM

## 2021-03-10 MED ORDER — CIPROFLOXACIN HCL 500 MG PO TABS
500.0000 mg | ORAL_TABLET | Freq: Two times a day (BID) | ORAL | 0 refills | Status: DC
Start: 2021-03-10 — End: 2021-06-23

## 2021-03-10 NOTE — Telephone Encounter (Signed)
Please notify patient sent Cipro 500 BID x 7 days. Since she has failed Keflex antibiotic for UTI.  [name], [facility] 03/10/2021, 3:54 PM

## 2021-03-10 NOTE — Telephone Encounter (Signed)
Pt called and stated the antibiotic hasnt been working and she was advised to call/ pt would like another antibiotic called into pharmacy that make work better / please advise

## 2021-03-14 LAB — MULTIPLE MYELOMA PANEL, SERUM
Albumin SerPl Elph-Mcnc: 3.9 g/dL (ref 2.9–4.4)
Albumin/Glob SerPl: 1.4 (ref 0.7–1.7)
Alpha 1: 0.3 g/dL (ref 0.0–0.4)
Alpha2 Glob SerPl Elph-Mcnc: 0.8 g/dL (ref 0.4–1.0)
B-Globulin SerPl Elph-Mcnc: 1.1 g/dL (ref 0.7–1.3)
Gamma Glob SerPl Elph-Mcnc: 0.7 g/dL (ref 0.4–1.8)
Globulin, Total: 2.9 g/dL (ref 2.2–3.9)
IgA: 199 mg/dL (ref 64–422)
IgG (Immunoglobin G), Serum: 766 mg/dL (ref 586–1602)
IgM (Immunoglobulin M), Srm: 35 mg/dL (ref 26–217)
M Protein SerPl Elph-Mcnc: 0.2 g/dL — ABNORMAL HIGH
Total Protein ELP: 6.8 g/dL (ref 6.0–8.5)

## 2021-03-22 ENCOUNTER — Inpatient Hospital Stay: Payer: Medicare Other

## 2021-03-22 ENCOUNTER — Encounter: Payer: Self-pay | Admitting: Oncology

## 2021-03-22 ENCOUNTER — Inpatient Hospital Stay (HOSPITAL_BASED_OUTPATIENT_CLINIC_OR_DEPARTMENT_OTHER): Payer: Medicare Other | Admitting: Oncology

## 2021-03-22 ENCOUNTER — Other Ambulatory Visit: Payer: Self-pay

## 2021-03-22 VITALS — BP 139/65 | HR 90 | Temp 98.6°F | Resp 18 | Wt 122.5 lb

## 2021-03-22 DIAGNOSIS — D472 Monoclonal gammopathy: Secondary | ICD-10-CM | POA: Diagnosis not present

## 2021-03-22 DIAGNOSIS — Z8249 Family history of ischemic heart disease and other diseases of the circulatory system: Secondary | ICD-10-CM | POA: Diagnosis not present

## 2021-03-22 DIAGNOSIS — D751 Secondary polycythemia: Secondary | ICD-10-CM | POA: Diagnosis not present

## 2021-03-22 DIAGNOSIS — Z803 Family history of malignant neoplasm of breast: Secondary | ICD-10-CM | POA: Diagnosis not present

## 2021-03-22 DIAGNOSIS — Z79899 Other long term (current) drug therapy: Secondary | ICD-10-CM | POA: Diagnosis not present

## 2021-03-22 DIAGNOSIS — R778 Other specified abnormalities of plasma proteins: Secondary | ICD-10-CM

## 2021-03-22 DIAGNOSIS — D7589 Other specified diseases of blood and blood-forming organs: Secondary | ICD-10-CM

## 2021-03-22 LAB — CBC WITH DIFFERENTIAL/PLATELET
Abs Immature Granulocytes: 0.01 10*3/uL (ref 0.00–0.07)
Basophils Absolute: 0 10*3/uL (ref 0.0–0.1)
Basophils Relative: 1 %
Eosinophils Absolute: 0.1 10*3/uL (ref 0.0–0.5)
Eosinophils Relative: 1 %
HCT: 51.2 % — ABNORMAL HIGH (ref 36.0–46.0)
Hemoglobin: 17.4 g/dL — ABNORMAL HIGH (ref 12.0–15.0)
Immature Granulocytes: 0 %
Lymphocytes Relative: 28 %
Lymphs Abs: 1.7 10*3/uL (ref 0.7–4.0)
MCH: 33.1 pg (ref 26.0–34.0)
MCHC: 34 g/dL (ref 30.0–36.0)
MCV: 97.5 fL (ref 80.0–100.0)
Monocytes Absolute: 0.7 10*3/uL (ref 0.1–1.0)
Monocytes Relative: 12 %
Neutro Abs: 3.5 10*3/uL (ref 1.7–7.7)
Neutrophils Relative %: 58 %
Platelets: 225 10*3/uL (ref 150–400)
RBC: 5.25 MIL/uL — ABNORMAL HIGH (ref 3.87–5.11)
RDW: 11.9 % (ref 11.5–15.5)
WBC: 6 10*3/uL (ref 4.0–10.5)
nRBC: 0 % (ref 0.0–0.2)

## 2021-03-22 NOTE — Progress Notes (Signed)
Hematology/Oncology Consult note [facility] Telephone:([phone] [phone]   Patient Care Team: Olin Hauser, DO as PCP - General (Family Medicine) Myer Haff, MD as Referring Physician (Psychiatry)  REFERRING PROVIDER: [name] * Dr. Honor Junes  CHIEF COMPLAINTS/REASON FOR VISIT:  Evaluation of abnormal SPEP  HISTORY OF PRESENTING ILLNESS:  Pamela Ball is a 75 y.o. female who was seen in consultation at the request of [name] * for evaluation of abnormal SPEP results.   Patient recently had work up done at endocrinology office. Labs reviewed,  02/22/2021 SPEP showed 0.3g/dL M spike,  and urine IFE appears unremarkable.  No aggravating or alleviated factors.  Patient has osteoporosis.  She takes calcium supplementation and vitamin D supplementation. Denies weight loss, fever chills, bone pain, neuropathy.  Patient reports chronic back problems status post surgeries.  INTERVAL HISTORY Pamela Ball is a 75 y.o. female who has above history reviewed by me today presents for follow up visit to discuss lab results. Problems and complaints are listed below: She was accompanied by her husband.  Patient has no new complaints.  She is a never smoker.  Review of Systems  Constitutional: Negative for appetite change, chills, fatigue and fever.  HENT:   Negative for hearing loss and voice change.        Chronic vision problem.  Eyes: Negative for eye problems.  Respiratory: Negative for chest tightness and cough.   Cardiovascular: Negative for chest pain.  Gastrointestinal: Negative for abdominal distention, abdominal pain and blood in stool.  Endocrine: Negative for hot flashes.  Genitourinary: Negative for difficulty urinating and frequency.   Musculoskeletal:       Multiple kyphoplasty surgeries.  No new bone pain  Skin: Negative for itching and rash.  Neurological: Negative for extremity weakness.   Hematological: Negative for adenopathy.  Psychiatric/Behavioral: Negative for confusion.     MEDICAL HISTORY:  Past Medical History:  Diagnosis Date  . Abdominal hernia with obstruction and without gangrene   . Anxiety 02/15/2017   denies  . Asthma   . Bipolar 1 disorder, depressed, full remission ([place]) 02/15/2017  . Bipolar affective disorder ([place])   . COPD (chronic obstructive pulmonary disease) ([place])   . Depression   . GERD (gastroesophageal reflux disease) 02/15/2017  . Glaucoma   . Headache   . Hiatal hernia   . History of abdominal hernia 05/21/2017  . History of compression fracture of spine 02/15/2017  . Hyperlipidemia   . Hypertension   . Incisional hernia   . Incisional ventral hernia w obstruction 06/02/2017  . Normocytic anemia 05/23/2017  . 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 Presbycusis of both ears 02/15/2017    SURGICAL HISTORY: Past Surgical History:  Procedure Laterality Date  . ABDOMINAL HYSTERECTOMY  11/06/2006  . APPENDECTOMY    . BREAST BIOPSY Right 12/14/2020   stereo bx of distortion/asymmetry, coil marker, path pending  . BREAST EXCISIONAL BIOPSY Left    age 56- benign   . CESAREAN SECTION     x3  . COLONOSCOPY WITH PROPOFOL N/A 06/25/2018   Procedure: COLONOSCOPY WITH PROPOFOL;  Surgeon: Jonathon Bellows, MD;  Location: [facility] ENDOSCOPY;  Service: Endoscopy;  Laterality: N/A;  . KYPHOPLASTY    . KYPHOPLASTY N/A 09/23/2018   Procedure: KYPHOPLASTY T10;  Surgeon: Hessie Knows, MD;  Location: [facility] ORS;  Service: Orthopedics;  Laterality: N/A;  . KYPHOPLASTY N/A 03/11/2020   Procedure: L3 KYPHOPLASTY;  Surgeon: Hessie Knows, MD;  Location: [facility] ORS;  Service:  Orthopedics;  Laterality: N/A;  . KYPHOPLASTY N/A 12/30/2020   Procedure: L5 KYPHOPLASTY;  Surgeon: Hessie Knows, MD;  Location: [facility] ORS;  Service: Orthopedics;  Laterality: N/A;  . ORIF HUMERUS FRACTURE Left 08/07/2019   Procedure: OPEN REDUCTION INTERNAL FIXATION (ORIF) LEFT DISTAL  HUMERUS FRACTURE;  Surgeon: Altamese Mount Sterling, MD;  Location: [place];  Service: Orthopedics;  Laterality: Left;  Marland Kitchen VENTRAL HERNIA REPAIR N/A 06/02/2017   Procedure: exploratory laparotomy repair of incarcerated  VENTRAL hernia;  Surgeon: Florene Glen, MD;  Location: [facility] ORS;  Service: General;  Laterality: N/A;  . VENTRAL HERNIA REPAIR N/A 08/29/2018   Procedure: LAPAROSCOPIC INCISIONAL HERNIA;  Surgeon: Olean Ree, MD;  Location: [facility] ORS;  Service: General;  Laterality: N/A;    SOCIAL HISTORY: Social History   Socioeconomic History  . Marital status: Married    Spouse name: Raychelle Hudman  . Number of children: Not on file  . Years of education: [employer]  . Highest education level: Not on file  Occupational History  . Occupation: retired  Tobacco Use  . Smoking status: Never Smoker  . Smokeless tobacco: Never Used  Vaping Use  . Vaping Use: Never used  Substance and Sexual Activity  . Alcohol use: No    Comment: drink occasionally   . Drug use: No  . Sexual activity: Not Currently  Other Topics Concern  . Not on file  Social History Narrative  . Not on file   Social Determinants of Health   Financial Resource Strain: Low Risk   . Difficulty of Paying Living Expenses: Not hard at all  Food Insecurity: No Food Insecurity  . Worried About Charity fundraiser in the Last Year: Never true  . Ran Out of Food in the Last Year: Never true  Transportation Needs: No Transportation Needs  . Lack of Transportation (Medical): No  . Lack of Transportation (Non-Medical): No  Physical Activity: Inactive  . Days of Exercise per Week: 0 days  . Minutes of Exercise per Session: 0 min  Stress: No Stress Concern Present  . Feeling of Stress : Not at all  Social Connections: Not on file  Intimate Partner Violence: Not on file    FAMILY HISTORY: Family History  Problem Relation Age of Onset  . Cancer Mother        cancer  . Breast cancer Mother 36  . Hypertension Father      ALLERGIES:  has No Known Allergies.  MEDICATIONS:  Current Outpatient Medications  Medication Sig Dispense Refill  . albuterol (VENTOLIN HFA) 108 (90 Base) MCG/ACT inhaler INHALE 2 PUFFS INTO THE LUNGS EVERY 4 HOURS AS NEEDED FOR WHEEZING OR SHORTNESS OF BREATH (COUGH) (Patient taking differently: Inhale 1-2 puffs into the lungs every 4 (four) hours as needed for shortness of breath or wheezing.) 8.5 g 1  . alendronate (FOSAMAX) 70 MG tablet Take by mouth. Take 1 tablet (70 mg total) by mouth every 7 (seven) days Take with a full glass of water. Do not lie down for the next 30 min.    Marland Kitchen amLODipine (NORVASC) 10 MG tablet Take 1 tablet (10 mg total) by mouth daily. 90 tablet 3  . buPROPion (WELLBUTRIN XL) 150 MG 24 hr tablet Take 150 mg by mouth daily.    . busPIRone (BUSPAR) 10 MG tablet Take 10 mg by mouth daily. Prescribed by Psychiatry Dr Kasandra Knudsen    . Calcium Citrate-Vitamin D (CALCIUM + D PO) Take by mouth.    . Cholecalciferol (VITAMIN  D3) 50 MCG (2000 UT) TABS Take 2,000 Units by mouth daily.    . Cyanocobalamin (VITAMIN B-12) 3000 MCG SUBL Take 3,000 mcg by mouth daily.    . Ferrous Fumarate (IRON) 18 MG TBCR Take 18 mg by mouth daily. W/Rice Protein    . ibuprofen (ADVIL) 600 MG tablet TAKE 1 TABLET BY MOUTH EVERY 8 HOURS AS NEEDED. 90 tablet 0  . Multiple Vitamin (MULTIVITAMIN WITH MINERALS) TABS tablet Take 1 tablet by mouth daily.    . Multiple Vitamins-Minerals (PRESERVISION AREDS) TABS Take 1 tablet by mouth daily.    Marland Kitchen OLANZapine (ZYPREXA) 7.5 MG tablet Take 7.5 mg by mouth daily.    Marland Kitchen omeprazole (PRILOSEC) 20 MG capsule TAKE 1 CAPSULE (20 MG TOTAL) BY MOUTH DAILY BEFORE BREAKFAST. 90 capsule 1  . ondansetron (ZOFRAN-ODT) 4 MG disintegrating tablet Take 1 tablet (4 mg total) by mouth every 8 (eight) hours as needed for nausea or vomiting. 30 tablet 2  . simvastatin (ZOCOR) 20 MG tablet TAKE 1 TABLET BY MOUTH EVERY DAY IN THE MORNING (Patient taking differently: Take 20 mg by mouth  daily.) 90 tablet 1  . venlafaxine XR (EFFEXOR-XR) 75 MG 24 hr capsule Take 75 mg by mouth daily with breakfast.     . vitamin C (ASCORBIC ACID) 500 MG tablet Take 500 mg by mouth daily.    . ciprofloxacin (CIPRO) 500 MG tablet Take 1 tablet (500 mg total) by mouth 2 (two) times daily. For 7 days (Patient not taking: Reported on 03/22/2021) 14 tablet 0  . oxyCODONE (OXY IR/ROXICODONE) 5 MG immediate release tablet Take 1 tablet (5 mg total) by mouth every 4 (four) hours as needed. (Patient not taking: Reported on 03/22/2021) 30 tablet 0   No current facility-administered medications for this visit.     PHYSICAL EXAMINATION: ECOG PERFORMANCE STATUS: 1 - Symptomatic but completely ambulatory Vitals:   03/22/21 1005  BP: 139/65  Pulse: 90  Resp: 18  Temp: 98.6 F (37 C)   Filed Weights   03/22/21 1005  Weight: 122 lb 8 oz (55.6 kg)    Physical Exam Constitutional:      General: She is not in acute distress.    Comments: Patient walks with a cane  HENT:     Head: Normocephalic and atraumatic.  Eyes:     General: No scleral icterus. Cardiovascular:     Rate and Rhythm: Normal rate and regular rhythm.     Heart sounds: Normal heart sounds.  Pulmonary:     Effort: Pulmonary effort is normal. No respiratory distress.     Breath sounds: No wheezing.  Abdominal:     General: Bowel sounds are normal. There is no distension.     Palpations: Abdomen is soft.  Musculoskeletal:        General: No deformity. Normal range of motion.     Cervical back: Normal range of motion and neck supple.  Skin:    General: Skin is warm and dry.     Findings: No erythema or rash.  Neurological:     Mental Status: She is alert and oriented to person, place, and time. Mental status is at baseline.     Cranial Nerves: No cranial nerve deficit.     Coordination: Coordination normal.  Psychiatric:        Mood and Affect: Mood normal.      LABORATORY DATA:  I have reviewed the data as listed Lab  Results  Component Value Date   WBC 6.0  03/22/2021   HGB 17.4 (H) 03/22/2021   HCT 51.2 (H) 03/22/2021   MCV 97.5 03/22/2021   PLT 225 03/22/2021   Recent Labs    05/29/20 1914 06/28/20 0927 03/08/21 1205  NA 141 139 139  K 3.7 4.4 3.8  CL 102 102 98  CO2 '26 27 27  ' GLUCOSE 96 103* 104*  BUN '17 10 12  ' CREATININE 0.81 0.59* 0.53  CALCIUM 9.2 9.7 9.3  GFRNONAA >60 90 >60  GFRAA >60 105  --   PROT 6.8 6.4 7.4  ALBUMIN 4.0  --  4.3  AST '21 22 29  ' ALT '14 14 22  ' ALKPHOS 109  --  117  BILITOT 0.8 0.4 0.6   Iron/TIBC/Ferritin/ %Sat No results found for: IRON, TIBC, FERRITIN, IRONPCTSAT    RADIOGRAPHIC STUDIES: I have personally reviewed the radiological images as listed and agreed with the findings in the report.  No results found.   ASSESSMENT & PLAN:  1. MGUS (monoclonal gammopathy of unknown significance)   2. Erythrocytosis    #IgG lambda MGUS I discussed with patient about the diagnosis of IgG lambda MGUS which is an asymptomatic condition which has a small risk of progression to smoldering multiple myeloma and to symptomatic multiple myeloma. Less frequently, these patients progress to AL amyloidosis, light chain deposition disease, or another lymphoproliferative disorder. For now I recommend observation. Check SPEP and light chain ratio every 6 months.   #Erythrocytosis, she is a never smoker.  Rule out other etiologies. Repeat CBC today, check erythropoietin level, carbon monoxide level, JAK2 V617F mutation with reflex. Plan to communicate with patient after these labs are back. Most likely she will need a phlebotomy if hematocrit >50.   Orders Placed This Encounter  Procedures  . JAK2 V617F, w Reflex to CALR/E12/MPL    Standing Status:   Future    Number of Occurrences:   1    Standing Expiration Date:   03/22/2022  . Carbon monoxide, blood (performed at ref lab)    Standing Status:   Future    Number of Occurrences:   1    Standing Expiration Date:    03/22/2022  . Erythropoietin    Standing Status:   Future    Number of Occurrences:   1    Standing Expiration Date:   03/22/2022  . CBC with Differential/Platelet    Standing Status:   Future    Number of Occurrences:   1    Standing Expiration Date:   03/22/2022  . CBC with Differential/Platelet    Standing Status:   Future    Standing Expiration Date:   03/22/2022  . Comprehensive metabolic panel    Standing Status:   Future    Standing Expiration Date:   03/22/2022  . Multiple Myeloma Panel (SPEP&IFE w/QIG)    Standing Status:   Future    Standing Expiration Date:   03/22/2022  . Kappa/lambda light chains    Standing Status:   Future    Standing Expiration Date:   03/22/2022    All questions were answered. The patient knows to call the clinic with any problems questions or concerns.  Cc Karamalegos, [place] [facility] *  Return of visit: Tentatively 6 months for follow-up of MGUS.  She may need to be seen earlier than that if current blood work results reviewed additional need for visits.   Earlie Server, MD, PhD 03/22/2021

## 2021-03-22 NOTE — Progress Notes (Signed)
Patient here for follow up. No new concerns voiced.  °

## 2021-03-23 LAB — CARBON MONOXIDE, BLOOD (PERFORMED AT REF LAB): Carbon Monoxide, Blood: 1.7 % (ref 0.0–3.6)

## 2021-03-23 LAB — ERYTHROPOIETIN: Erythropoietin: 14.3 m[IU]/mL (ref 2.6–18.5)

## 2021-04-08 LAB — CALR + JAK2 E12-15 + MPL (REFLEXED)

## 2021-04-08 LAB — JAK2 V617F, W REFLEX TO CALR/E12/MPL

## 2021-05-03 DIAGNOSIS — S199XXA Unspecified injury of neck, initial encounter: Secondary | ICD-10-CM | POA: Diagnosis not present

## 2021-05-03 DIAGNOSIS — S79912A Unspecified injury of left hip, initial encounter: Secondary | ICD-10-CM | POA: Diagnosis not present

## 2021-05-03 DIAGNOSIS — J432 Centrilobular emphysema: Secondary | ICD-10-CM | POA: Diagnosis not present

## 2021-05-03 DIAGNOSIS — S42211A Unspecified displaced fracture of surgical neck of right humerus, initial encounter for closed fracture: Secondary | ICD-10-CM | POA: Diagnosis not present

## 2021-05-03 DIAGNOSIS — R918 Other nonspecific abnormal finding of lung field: Secondary | ICD-10-CM | POA: Diagnosis not present

## 2021-05-03 DIAGNOSIS — R6889 Other general symptoms and signs: Secondary | ICD-10-CM | POA: Diagnosis not present

## 2021-05-03 DIAGNOSIS — M810 Age-related osteoporosis without current pathological fracture: Secondary | ICD-10-CM | POA: Diagnosis not present

## 2021-05-03 DIAGNOSIS — S32402A Unspecified fracture of left acetabulum, initial encounter for closed fracture: Secondary | ICD-10-CM | POA: Diagnosis not present

## 2021-05-03 DIAGNOSIS — R262 Difficulty in walking, not elsewhere classified: Secondary | ICD-10-CM | POA: Diagnosis not present

## 2021-05-03 DIAGNOSIS — S32492A Other specified fracture of left acetabulum, initial encounter for closed fracture: Secondary | ICD-10-CM | POA: Diagnosis not present

## 2021-05-03 DIAGNOSIS — S42292A Other displaced fracture of upper end of left humerus, initial encounter for closed fracture: Secondary | ICD-10-CM | POA: Diagnosis not present

## 2021-05-03 DIAGNOSIS — Z743 Need for continuous supervision: Secondary | ICD-10-CM | POA: Diagnosis not present

## 2021-05-03 DIAGNOSIS — Z01818 Encounter for other preprocedural examination: Secondary | ICD-10-CM | POA: Diagnosis not present

## 2021-05-03 DIAGNOSIS — S72142A Displaced intertrochanteric fracture of left femur, initial encounter for closed fracture: Secondary | ICD-10-CM | POA: Diagnosis not present

## 2021-05-03 DIAGNOSIS — S0990XA Unspecified injury of head, initial encounter: Secondary | ICD-10-CM | POA: Diagnosis not present

## 2021-05-03 DIAGNOSIS — R52 Pain, unspecified: Secondary | ICD-10-CM | POA: Diagnosis not present

## 2021-05-03 DIAGNOSIS — H548 Legal blindness, as defined in USA: Secondary | ICD-10-CM | POA: Diagnosis not present

## 2021-05-03 DIAGNOSIS — S3993XA Unspecified injury of pelvis, initial encounter: Secondary | ICD-10-CM | POA: Diagnosis not present

## 2021-05-03 DIAGNOSIS — R0902 Hypoxemia: Secondary | ICD-10-CM | POA: Diagnosis not present

## 2021-05-03 DIAGNOSIS — S42212A Unspecified displaced fracture of surgical neck of left humerus, initial encounter for closed fracture: Secondary | ICD-10-CM | POA: Diagnosis not present

## 2021-05-03 DIAGNOSIS — R9431 Abnormal electrocardiogram [ECG] [EKG]: Secondary | ICD-10-CM | POA: Diagnosis not present

## 2021-05-03 DIAGNOSIS — S32810A Multiple fractures of pelvis with stable disruption of pelvic ring, initial encounter for closed fracture: Secondary | ICD-10-CM | POA: Diagnosis not present

## 2021-05-03 DIAGNOSIS — R531 Weakness: Secondary | ICD-10-CM | POA: Diagnosis not present

## 2021-05-03 DIAGNOSIS — H3530 Unspecified macular degeneration: Secondary | ICD-10-CM | POA: Diagnosis not present

## 2021-05-03 DIAGNOSIS — K219 Gastro-esophageal reflux disease without esophagitis: Secondary | ICD-10-CM | POA: Diagnosis not present

## 2021-05-03 DIAGNOSIS — I10 Essential (primary) hypertension: Secondary | ICD-10-CM | POA: Diagnosis not present

## 2021-05-04 DIAGNOSIS — W19XXXA Unspecified fall, initial encounter: Secondary | ICD-10-CM | POA: Insufficient documentation

## 2021-05-04 DIAGNOSIS — H3530 Unspecified macular degeneration: Secondary | ICD-10-CM | POA: Diagnosis not present

## 2021-05-04 DIAGNOSIS — H548 Legal blindness, as defined in USA: Secondary | ICD-10-CM | POA: Diagnosis not present

## 2021-05-04 DIAGNOSIS — I10 Essential (primary) hypertension: Secondary | ICD-10-CM | POA: Diagnosis not present

## 2021-05-04 DIAGNOSIS — S32402A Unspecified fracture of left acetabulum, initial encounter for closed fracture: Secondary | ICD-10-CM | POA: Insufficient documentation

## 2021-05-04 DIAGNOSIS — R296 Repeated falls: Secondary | ICD-10-CM | POA: Diagnosis not present

## 2021-05-05 DIAGNOSIS — H548 Legal blindness, as defined in USA: Secondary | ICD-10-CM | POA: Diagnosis not present

## 2021-05-05 DIAGNOSIS — M810 Age-related osteoporosis without current pathological fracture: Secondary | ICD-10-CM | POA: Diagnosis not present

## 2021-05-05 DIAGNOSIS — I10 Essential (primary) hypertension: Secondary | ICD-10-CM | POA: Diagnosis not present

## 2021-05-05 DIAGNOSIS — H3530 Unspecified macular degeneration: Secondary | ICD-10-CM | POA: Diagnosis not present

## 2021-05-06 DIAGNOSIS — S42202D Unspecified fracture of upper end of left humerus, subsequent encounter for fracture with routine healing: Secondary | ICD-10-CM | POA: Diagnosis not present

## 2021-05-06 DIAGNOSIS — H3530 Unspecified macular degeneration: Secondary | ICD-10-CM | POA: Diagnosis not present

## 2021-05-06 DIAGNOSIS — S32402D Unspecified fracture of left acetabulum, subsequent encounter for fracture with routine healing: Secondary | ICD-10-CM | POA: Diagnosis not present

## 2021-05-06 DIAGNOSIS — M25552 Pain in left hip: Secondary | ICD-10-CM | POA: Diagnosis not present

## 2021-05-06 DIAGNOSIS — M810 Age-related osteoporosis without current pathological fracture: Secondary | ICD-10-CM | POA: Diagnosis not present

## 2021-05-06 DIAGNOSIS — K219 Gastro-esophageal reflux disease without esophagitis: Secondary | ICD-10-CM | POA: Diagnosis not present

## 2021-05-06 DIAGNOSIS — S32512A Fracture of superior rim of left pubis, initial encounter for closed fracture: Secondary | ICD-10-CM | POA: Diagnosis not present

## 2021-05-06 DIAGNOSIS — M6281 Muscle weakness (generalized): Secondary | ICD-10-CM | POA: Diagnosis not present

## 2021-05-06 DIAGNOSIS — R279 Unspecified lack of coordination: Secondary | ICD-10-CM | POA: Diagnosis not present

## 2021-05-06 DIAGNOSIS — S32512D Fracture of superior rim of left pubis, subsequent encounter for fracture with routine healing: Secondary | ICD-10-CM | POA: Diagnosis not present

## 2021-05-06 DIAGNOSIS — I10 Essential (primary) hypertension: Secondary | ICD-10-CM | POA: Diagnosis not present

## 2021-05-06 DIAGNOSIS — S32402A Unspecified fracture of left acetabulum, initial encounter for closed fracture: Secondary | ICD-10-CM | POA: Diagnosis not present

## 2021-05-06 DIAGNOSIS — W19XXXA Unspecified fall, initial encounter: Secondary | ICD-10-CM | POA: Diagnosis not present

## 2021-05-06 DIAGNOSIS — S42212A Unspecified displaced fracture of surgical neck of left humerus, initial encounter for closed fracture: Secondary | ICD-10-CM | POA: Diagnosis not present

## 2021-05-06 DIAGNOSIS — R531 Weakness: Secondary | ICD-10-CM | POA: Diagnosis not present

## 2021-05-06 DIAGNOSIS — Z743 Need for continuous supervision: Secondary | ICD-10-CM | POA: Diagnosis not present

## 2021-05-06 DIAGNOSIS — J432 Centrilobular emphysema: Secondary | ICD-10-CM | POA: Diagnosis not present

## 2021-05-06 DIAGNOSIS — R5381 Other malaise: Secondary | ICD-10-CM | POA: Diagnosis not present

## 2021-05-06 DIAGNOSIS — R2689 Other abnormalities of gait and mobility: Secondary | ICD-10-CM | POA: Diagnosis not present

## 2021-05-06 DIAGNOSIS — S32492A Other specified fracture of left acetabulum, initial encounter for closed fracture: Secondary | ICD-10-CM | POA: Diagnosis not present

## 2021-05-06 DIAGNOSIS — E440 Moderate protein-calorie malnutrition: Secondary | ICD-10-CM | POA: Diagnosis not present

## 2021-05-06 DIAGNOSIS — R52 Pain, unspecified: Secondary | ICD-10-CM | POA: Diagnosis not present

## 2021-05-06 DIAGNOSIS — J449 Chronic obstructive pulmonary disease, unspecified: Secondary | ICD-10-CM | POA: Diagnosis not present

## 2021-05-06 DIAGNOSIS — R262 Difficulty in walking, not elsewhere classified: Secondary | ICD-10-CM | POA: Diagnosis not present

## 2021-05-06 DIAGNOSIS — E785 Hyperlipidemia, unspecified: Secondary | ICD-10-CM | POA: Diagnosis not present

## 2021-05-06 DIAGNOSIS — H548 Legal blindness, as defined in USA: Secondary | ICD-10-CM | POA: Diagnosis not present

## 2021-05-06 DIAGNOSIS — R1319 Other dysphagia: Secondary | ICD-10-CM | POA: Diagnosis not present

## 2021-05-10 DIAGNOSIS — M810 Age-related osteoporosis without current pathological fracture: Secondary | ICD-10-CM | POA: Diagnosis not present

## 2021-05-10 DIAGNOSIS — H3530 Unspecified macular degeneration: Secondary | ICD-10-CM | POA: Diagnosis not present

## 2021-05-10 DIAGNOSIS — R5381 Other malaise: Secondary | ICD-10-CM | POA: Diagnosis not present

## 2021-05-10 DIAGNOSIS — S32512A Fracture of superior rim of left pubis, initial encounter for closed fracture: Secondary | ICD-10-CM | POA: Diagnosis not present

## 2021-05-10 DIAGNOSIS — H548 Legal blindness, as defined in USA: Secondary | ICD-10-CM | POA: Diagnosis not present

## 2021-05-10 DIAGNOSIS — I10 Essential (primary) hypertension: Secondary | ICD-10-CM | POA: Diagnosis not present

## 2021-05-10 DIAGNOSIS — S32402A Unspecified fracture of left acetabulum, initial encounter for closed fracture: Secondary | ICD-10-CM | POA: Diagnosis not present

## 2021-05-12 DIAGNOSIS — I10 Essential (primary) hypertension: Secondary | ICD-10-CM | POA: Diagnosis not present

## 2021-05-17 DIAGNOSIS — S32512D Fracture of superior rim of left pubis, subsequent encounter for fracture with routine healing: Secondary | ICD-10-CM | POA: Diagnosis not present

## 2021-05-17 DIAGNOSIS — S32402D Unspecified fracture of left acetabulum, subsequent encounter for fracture with routine healing: Secondary | ICD-10-CM | POA: Diagnosis not present

## 2021-05-17 DIAGNOSIS — H3530 Unspecified macular degeneration: Secondary | ICD-10-CM | POA: Diagnosis not present

## 2021-05-19 DIAGNOSIS — S32512D Fracture of superior rim of left pubis, subsequent encounter for fracture with routine healing: Secondary | ICD-10-CM | POA: Diagnosis not present

## 2021-05-19 DIAGNOSIS — S32402D Unspecified fracture of left acetabulum, subsequent encounter for fracture with routine healing: Secondary | ICD-10-CM | POA: Diagnosis not present

## 2021-05-19 DIAGNOSIS — H548 Legal blindness, as defined in USA: Secondary | ICD-10-CM | POA: Diagnosis not present

## 2021-05-19 DIAGNOSIS — I10 Essential (primary) hypertension: Secondary | ICD-10-CM | POA: Diagnosis not present

## 2021-05-19 DIAGNOSIS — M810 Age-related osteoporosis without current pathological fracture: Secondary | ICD-10-CM | POA: Diagnosis not present

## 2021-05-19 DIAGNOSIS — R5381 Other malaise: Secondary | ICD-10-CM | POA: Diagnosis not present

## 2021-05-19 DIAGNOSIS — H3530 Unspecified macular degeneration: Secondary | ICD-10-CM | POA: Diagnosis not present

## 2021-05-20 ENCOUNTER — Other Ambulatory Visit: Payer: Self-pay | Admitting: Family Medicine

## 2021-05-20 DIAGNOSIS — R110 Nausea: Secondary | ICD-10-CM

## 2021-05-20 NOTE — Telephone Encounter (Signed)
Requested medication (s) are due for refill today: no  Requested medication (s) are on the active medication list: yes   Last refill:  01/26/2021  Future visit scheduled: yes  Notes to clinic:  this refill cannot be delegated    Requested Prescriptions  Pending Prescriptions Disp Refills   ondansetron (ZOFRAN-ODT) 4 MG disintegrating tablet [Pharmacy Med Name: ONDANSETRON ODT 4 MG TABLET] 30 tablet 2    Sig: TAKE 1 TABLET BY MOUTH EVERY 8 HOURS AS NEEDED FOR NAUSEA AND VOMITING      Not Delegated - Gastroenterology: Antiemetics Failed - 05/20/2021  9:30 AM      Failed - This refill cannot be delegated      Passed - Valid encounter within last 6 months    Recent Outpatient Visits           3 months ago Acute cystitis with hematuria   [place], DO   6 months ago Mass of right breast   [place], DO   9 months ago Rib pain on right side   [place], FNP   9 months ago Acute cystitis with hematuria   [place], DO   10 months ago Annual physical exam   [facility] Olin Hauser, DO       Future Appointments             In 9 months [facility], [facility]

## 2021-05-21 DIAGNOSIS — S42202D Unspecified fracture of upper end of left humerus, subsequent encounter for fracture with routine healing: Secondary | ICD-10-CM | POA: Diagnosis not present

## 2021-05-21 DIAGNOSIS — S32402D Unspecified fracture of left acetabulum, subsequent encounter for fracture with routine healing: Secondary | ICD-10-CM | POA: Diagnosis not present

## 2021-05-21 DIAGNOSIS — J449 Chronic obstructive pulmonary disease, unspecified: Secondary | ICD-10-CM | POA: Diagnosis not present

## 2021-05-22 DIAGNOSIS — R5381 Other malaise: Secondary | ICD-10-CM | POA: Diagnosis not present

## 2021-05-24 DIAGNOSIS — M80052D Age-related osteoporosis with current pathological fracture, left femur, subsequent encounter for fracture with routine healing: Secondary | ICD-10-CM | POA: Diagnosis not present

## 2021-05-24 DIAGNOSIS — H543 Unqualified visual loss, both eyes: Secondary | ICD-10-CM | POA: Diagnosis not present

## 2021-05-24 DIAGNOSIS — W1830XD Fall on same level, unspecified, subsequent encounter: Secondary | ICD-10-CM | POA: Diagnosis not present

## 2021-05-24 DIAGNOSIS — M800AXD Age-related osteoporosis with current pathological fracture, other site, subsequent encounter for fracture with routine healing: Secondary | ICD-10-CM | POA: Diagnosis not present

## 2021-05-24 DIAGNOSIS — J440 Chronic obstructive pulmonary disease with acute lower respiratory infection: Secondary | ICD-10-CM | POA: Diagnosis not present

## 2021-05-24 DIAGNOSIS — Z9981 Dependence on supplemental oxygen: Secondary | ICD-10-CM | POA: Diagnosis not present

## 2021-05-24 DIAGNOSIS — I10 Essential (primary) hypertension: Secondary | ICD-10-CM | POA: Diagnosis not present

## 2021-05-25 ENCOUNTER — Inpatient Hospital Stay: Payer: Medicare Other | Admitting: Family Medicine

## 2021-05-26 ENCOUNTER — Telehealth: Payer: Self-pay | Admitting: Family Medicine

## 2021-05-26 NOTE — Telephone Encounter (Signed)
Pamela Ball with [facility] is calling for approval to see the the pt next week with OT for eval.  Pt was admitted Wednesday and has not been answering the phone.  [phone]

## 2021-05-26 NOTE — Telephone Encounter (Signed)
Yes, okay to give verbal. Thanks  [name], [facility] 05/26/2021, 2:36 PM

## 2021-05-27 ENCOUNTER — Ambulatory Visit (INDEPENDENT_AMBULATORY_CARE_PROVIDER_SITE_OTHER): Payer: Medicare Other | Admitting: Family Medicine

## 2021-05-27 ENCOUNTER — Other Ambulatory Visit: Payer: Self-pay

## 2021-05-27 ENCOUNTER — Encounter: Payer: Self-pay | Admitting: Family Medicine

## 2021-05-27 VITALS — HR 100 | Ht <= 58 in | Wt 122.8 lb

## 2021-05-27 DIAGNOSIS — S42475A Nondisplaced transcondylar fracture of left humerus, initial encounter for closed fracture: Secondary | ICD-10-CM

## 2021-05-27 DIAGNOSIS — S72002A Fracture of unspecified part of neck of left femur, initial encounter for closed fracture: Secondary | ICD-10-CM | POA: Diagnosis not present

## 2021-05-27 NOTE — Telephone Encounter (Signed)
Left a message on Pamela Ball's secure voicemail giving her the approval for next week.

## 2021-05-27 NOTE — Progress Notes (Signed)
Subjective:    Patient ID: Pamela Ball, female    DOB: 1945/11/21, 75 y.o.   MRN: [id number]  Pamela Ball is a 75 y.o. female presenting on 05/27/2021 for Hospitalization Follow-up, Arm Pain, and Hip Pain   HPI  HOSPITAL FOLLOW-UP VISIT  Hospital/Location: [facility] Date of Admission: 05/03/21 Date of Discharge: 05/06/21  Discharged to SNF Rehab for 18 days until coverage ran out. Discharged to home with home health.  Reason for Admission: Fall, fracture Primary (+Secondary) Diagnosis: Left humeral fracture, Left acetabular hip fracture  - Hospital H&P and Discharge Summary have been reviewed - Patient presents today after recent hospitalization. Brief summary of recent course, patient had symptoms of Left arm and hip pain after fall injury, history of osteoporosis and fracture. Orthopedics recommended non operative management.  Waiting on [place] PT therapist, was supposed to come by yesterday but they did not show.  Plan to go to Dr Rudene Christians for outpatient Orthopedics  She received 33 pill oxycodone IR on 05/17/21. Has about 15 pills left. Pain medicine does help her significantly with function and reducing pain.  Symptoms of pain still present.  I have reviewed the discharge medication list, and have reconciled the current and discharge medications today.   Current Outpatient Medications:    albuterol (VENTOLIN HFA) 108 (90 Base) MCG/ACT inhaler, INHALE 2 PUFFS INTO THE LUNGS EVERY 4 HOURS AS NEEDED FOR WHEEZING OR SHORTNESS OF BREATH (COUGH) (Patient taking differently: Inhale 1-2 puffs into the lungs every 4 (four) hours as needed for shortness of breath or wheezing.), Disp: 8.5 g, Rfl: 1   alendronate (FOSAMAX) 70 MG tablet, Take by mouth. Take 1 tablet (70 mg total) by mouth every 7 (seven) days Take with a full glass of water. Do not lie down for the next 30 min., Disp: , Rfl:    amLODipine (NORVASC) 10 MG tablet, Take 1 tablet (10 mg total) by mouth  daily., Disp: 90 tablet, Rfl: 3   buPROPion (WELLBUTRIN XL) 150 MG 24 hr tablet, Take 150 mg by mouth daily., Disp: , Rfl:    busPIRone (BUSPAR) 10 MG tablet, Take 10 mg by mouth daily. Prescribed by Psychiatry Dr Kasandra Knudsen, Disp: , Rfl:    Calcium Citrate-Vitamin D (CALCIUM + D PO), Take by mouth., Disp: , Rfl:    Cholecalciferol (VITAMIN D3) 50 MCG (2000 UT) TABS, Take 2,000 Units by mouth daily., Disp: , Rfl:    Cyanocobalamin (VITAMIN B-12) 3000 MCG SUBL, Take 3,000 mcg by mouth daily., Disp: , Rfl:    doxycycline (MONODOX) 100 MG capsule, Take 100 mg by mouth 2 (two) times daily., Disp: , Rfl:    Ferrous Fumarate (IRON) 18 MG TBCR, Take 18 mg by mouth daily. W/Rice Protein, Disp: , Rfl:    ibuprofen (ADVIL) 600 MG tablet, TAKE 1 TABLET BY MOUTH EVERY 8 HOURS AS NEEDED., Disp: 90 tablet, Rfl: 0   mirtazapine (REMERON) 15 MG tablet, Take 7.5 mg by mouth daily., Disp: , Rfl:    Multiple Vitamin (MULTIVITAMIN WITH MINERALS) TABS tablet, Take 1 tablet by mouth daily., Disp: , Rfl:    Multiple Vitamins-Minerals (PRESERVISION AREDS) TABS, Take 1 tablet by mouth daily., Disp: , Rfl:    OLANZapine (ZYPREXA) 7.5 MG tablet, Take 7.5 mg by mouth daily., Disp: , Rfl:    omeprazole (PRILOSEC) 20 MG capsule, TAKE 1 CAPSULE (20 MG TOTAL) BY MOUTH DAILY BEFORE BREAKFAST., Disp: 90 capsule, Rfl: 1   ondansetron (ZOFRAN-ODT) 4 MG disintegrating tablet, TAKE  1 TABLET BY MOUTH EVERY 8 HOURS AS NEEDED FOR NAUSEA AND VOMITING, Disp: 30 tablet, Rfl: 2   oxyCODONE (OXY IR/ROXICODONE) 5 MG immediate release tablet, Take 1 tablet (5 mg total) by mouth every 4 (four) hours as needed., Disp: 30 tablet, Rfl: 0   simvastatin (ZOCOR) 20 MG tablet, TAKE 1 TABLET BY MOUTH EVERY DAY IN THE MORNING (Patient taking differently: Take 20 mg by mouth daily.), Disp: 90 tablet, Rfl: 1   venlafaxine XR (EFFEXOR-XR) 75 MG 24 hr capsule, Take 75 mg by mouth daily with breakfast. , Disp: , Rfl:    vitamin C (ASCORBIC ACID) 500 MG tablet, Take  500 mg by mouth daily., Disp: , Rfl:    ciprofloxacin (CIPRO) 500 MG tablet, Take 1 tablet (500 mg total) by mouth 2 (two) times daily. For 7 days (Patient not taking: No sig reported), Disp: 14 tablet, Rfl: 0  ------------------------------------------------------------------------- Social History   Tobacco Use   Smoking status: Never   Smokeless tobacco: Never  Vaping Use   Vaping Use: Never used  Substance Use Topics   Alcohol use: No    Comment: drink occasionally    Drug use: No    Review of Systems Per HPI unless specifically indicated above     Objective:    Pulse 100   Ht '4\' 10"'$  (1.473 m)   Wt 122 lb 12.8 oz (55.7 kg)   SpO2 90%   BMI 25.67 kg/m   Wt Readings from Last 3 Encounters:  05/27/21 122 lb 12.8 oz (55.7 kg)  03/22/21 122 lb 8 oz (55.6 kg)  03/08/21 122 lb 9.6 oz (55.6 kg)    Physical Exam Vitals and nursing note reviewed.  Constitutional:      General: She is not in acute distress.    Appearance: Normal appearance. She is well-developed. She is not diaphoretic.     Comments: Well-appearing, comfortable, cooperative  HENT:     Head: Normocephalic and atraumatic.  Eyes:     General:        Right eye: No discharge.        Left eye: No discharge.     Conjunctiva/sclera: Conjunctivae normal.  Cardiovascular:     Rate and Rhythm: Normal rate.  Pulmonary:     Effort: Pulmonary effort is normal.  Musculoskeletal:     Comments: In wheelchair, Left shoulder in sling. Limited mobility now due to pain  Skin:    General: Skin is warm and dry.     Findings: No erythema or rash.  Neurological:     Mental Status: She is alert and oriented to person, place, and time.  Psychiatric:        Mood and Affect: Mood normal.        Behavior: Behavior normal.        Thought Content: Thought content normal.     Comments: Well groomed, good eye contact, normal speech and thoughts     Results for orders placed or performed in visit on 03/22/21  CBC with  Differential/Platelet  Result Value Ref Range   WBC 6.0 4.0 - 10.5 K/uL   RBC 5.25 (H) 3.87 - 5.11 MIL/uL   Hemoglobin 17.4 (H) 12.0 - 15.0 g/dL   HCT 51.2 (H) 36.0 - 46.0 %   MCV 97.5 80.0 - 100.0 fL   MCH 33.1 26.0 - 34.0 pg   MCHC 34.0 30.0 - 36.0 g/dL   RDW 11.9 11.5 - 15.5 %   Platelets 225 150 - 400 K/uL  nRBC 0.0 0.0 - 0.2 %   Neutrophils Relative % 58 %   Neutro Abs 3.5 1.7 - 7.7 K/uL   Lymphocytes Relative 28 %   Lymphs Abs 1.7 0.7 - 4.0 K/uL   Monocytes Relative 12 %   Monocytes Absolute 0.7 0.1 - 1.0 K/uL   Eosinophils Relative 1 %   Eosinophils Absolute 0.1 0.0 - 0.5 K/uL   Basophils Relative 1 %   Basophils Absolute 0.0 0.0 - 0.1 K/uL   Immature Granulocytes 0 %   Abs Immature Granulocytes 0.01 0.00 - 0.07 K/uL  Erythropoietin  Result Value Ref Range   Erythropoietin 14.3 2.6 - 18.5 mIU/mL  Carbon monoxide, blood (performed at ref lab)  Result Value Ref Range   Carbon Monoxide, Blood 1.7 0.0 - 3.6 %  JAK2 V617F, w Reflex to CALR/E12/MPL  Result Value Ref Range   JAK2 GenotypR Comment    BACKGROUND: Comment    Director Review, JAK2 Comment    REFLEX: Comment    Extraction Completed   CALR + JAK2 E12-15 + MPL (reflexed)  Result Value Ref Range   CALR Mutation Detection Result Comment    Background: Comment    Methodology: Comment    References: Comment    Director Review Comment    JAK2 Exons 12-15 Mut Det PCR: Comment    BACKGROUND: Comment    Method Comment    References Comment    DIRECTOR REVIEW: Comment    MPL MUTATION ANALYSIS RESULT: Comment    BACKGROUND: Comment    METHODOLOGY: Comment    REFERENCES: Comment    DIRECTOR REVIEW: Comment    Extraction Comment       Assessment & Plan:   Problem List Items Addressed This Visit   None Visit Diagnoses     Closed fracture of left hip, initial encounter ([place])    -  Primary   Relevant Orders   Ambulatory referral to Orthopedic Surgery   Closed nondisplaced transcondylar fracture of left  humerus, initial encounter       Relevant Orders   Ambulatory referral to Orthopedic Surgery      Recent hospitalization from fall / osteoporotic fracture L humerus / L hip, had SNF therapy now at home  Will need orthopedic follow-up  Referral to Dr Demetrio Lapping Ortho, will need further imaging follow-up, and management. Initial recommended non operative management at hospital  On oxycodone PRN pain, helps her function right now due to severity of pain. She may be due next week, they will try to establish with Ortho first and if need me to refill can call back next week.    Orders Placed This Encounter  Procedures   Ambulatory referral to Orthopedic Surgery    Referral Priority:   Routine    Referral Type:   Surgical    Referral Reason:   Specialty Services Required    Requested Specialty:   Orthopedic Surgery    Number of Visits Requested:   1      No orders of the defined types were placed in this encounter.   Follow up plan: Return if symptoms worsen or fail to improve.  [name], [facility] 05/27/2021, 11:02 AM

## 2021-05-27 NOTE — Patient Instructions (Addendum)
Thank you for coming to the office today.  Call us ASAP by Monday if you need me to re order the pain medication.  Referral to Dr Rudene Christians, stay tuned for apt  Need home health to get started  Please schedule a Follow-up Appointment to: Return if symptoms worsen or fail to improve.  If you have any other questions or concerns, please feel free to call the office or send a message through [place]. You may also schedule an earlier appointment if necessary.  Additionally, you may be receiving a survey about your experience at our office within a few days to 1 week by e-mail or mail. We value your feedback.  [name], DO [place]

## 2021-05-30 ENCOUNTER — Other Ambulatory Visit: Payer: Self-pay

## 2021-05-30 ENCOUNTER — Telehealth: Payer: Self-pay | Admitting: Family Medicine

## 2021-05-30 ENCOUNTER — Ambulatory Visit: Payer: Self-pay

## 2021-05-30 DIAGNOSIS — S42475D Nondisplaced transcondylar fracture of left humerus, subsequent encounter for fracture with routine healing: Secondary | ICD-10-CM

## 2021-05-30 DIAGNOSIS — S72002D Fracture of unspecified part of neck of left femur, subsequent encounter for closed fracture with routine healing: Secondary | ICD-10-CM

## 2021-05-30 MED ORDER — OXYCODONE HCL 5 MG PO TABS: 5.0000 mg | ORAL_TABLET | ORAL | 0 refills | Status: DC | PRN

## 2021-05-30 NOTE — Telephone Encounter (Signed)
Sent rx, as discussed in visit 7/22.  [name], [facility] 05/30/2021, 2:10 PM

## 2021-05-30 NOTE — Telephone Encounter (Signed)
Reason for Disposition  Swollen ankle joint  (Exception: area of localized swelling which is itchy)  Answer Assessment - Initial Assessment Questions 1. LOCATION: "Which ankle is swollen?" "Where is the swelling?"     Both ankles left ankle 2. ONSET: "When did the swelling start?"     05/06/21 3. SIZE: "How large is the swelling?"     mild 4. PAIN: "Is there any pain?" If Yes, ask: "How bad is it?" (Scale 1-10; or mild, moderate, severe)   - NONE (0): no pain.   - MILD (1-3): doesn't interfere with normal activities.    - MODERATE (4-7): interferes with normal activities (e.g., work or school) or awakens from sleep, limping.    - SEVERE (8-10): excruciating pain, unable to do any normal activities, unable to walk.      No pain 5. CAUSE: "What do you think caused the ankle swelling?"     unknown 6. OTHER SYMPTOMS: "Do you have any other symptoms?" (e.g., fever, chest pain, difficulty breathing, calf pain)     On @ since hospitalization  Protocols used: Ankle Swelling-A-AH

## 2021-05-30 NOTE — Telephone Encounter (Signed)
Pt husband is calling and would like dr k to call in new oxycodone 5 mg. Pt is still waiting to see orthopaedic surgery. [facility] [address] [address] phone number [phone]

## 2021-05-30 NOTE — Telephone Encounter (Signed)
Last RF oxycodone 5 mg. Pt requesting refill. Pt will be out of medication tomorrow.  Requested Prescriptions  Pending Prescriptions Disp Refills   oxyCODONE (OXY IR/ROXICODONE) 5 MG immediate release tablet 30 tablet 0    Sig: Take 1 tablet (5 mg total) by mouth every 4 (four) hours as needed.      There is no refill protocol information for this order

## 2021-05-30 NOTE — Telephone Encounter (Signed)
Alright. Thank you. We will keep track of her swelling, but I do not have any other concerns with that - for right now she can try to do RICE therapy.  Rest, Ice, Compression Stocking, Elevation  I have already sent the Oxycodone pain medicine earlier today, this seems to be a duplicate request as we got a phone call refill request earlier today already.  They should check pharmacy. Says it was received by pharmacy at 2:09pm today  oxyCODONE (OXY IR/ROXICODONE) 5 MG immediate release tablet 30 tablet 0 05/30/2021    Sig - Route: Take 1 tablet (5 mg total) by mouth every 4 (four) hours as needed. - Oral   Sent to pharmacy as: oxyCODONE (OXY IR/ROXICODONE) 5 MG immediate release tablet   Earliest Fill Date: 05/30/2021   E-Prescribing Status: Receipt confirmed by pharmacy (05/30/2021  2:09 PM EDT)    [name], [facility] 05/30/2021, 3:52 PM

## 2021-05-30 NOTE — Telephone Encounter (Signed)
Called pt back and spoke with spouse.  Husband stated that pt has had swelling to both ankles and forgot to Dr. Parks Ranger at the latest visit on 05/27/21.Marland Kitchen Ankle swelling is mild, no pain, redness or warmth. Denies fever, chest pain, SOB or calf pain to either both calves. Pt elevated both legs at night and stated that the swelling goes down. Pt voiding every 45 minutes. Denies any urinary sx (burning, urgency).  Pt prefers not to come to office. Advised pt her PCP may want to see the swelling.  Care advice given and pt and spouse verbalized understanding.   Pt requesting refill of pain medication . She will be out tomorrow. [facility].

## 2021-06-01 DIAGNOSIS — Z9981 Dependence on supplemental oxygen: Secondary | ICD-10-CM | POA: Diagnosis not present

## 2021-06-01 DIAGNOSIS — H543 Unqualified visual loss, both eyes: Secondary | ICD-10-CM | POA: Diagnosis not present

## 2021-06-01 DIAGNOSIS — J440 Chronic obstructive pulmonary disease with acute lower respiratory infection: Secondary | ICD-10-CM | POA: Diagnosis not present

## 2021-06-01 DIAGNOSIS — M800AXD Age-related osteoporosis with current pathological fracture, other site, subsequent encounter for fracture with routine healing: Secondary | ICD-10-CM | POA: Diagnosis not present

## 2021-06-01 DIAGNOSIS — W1830XD Fall on same level, unspecified, subsequent encounter: Secondary | ICD-10-CM | POA: Diagnosis not present

## 2021-06-01 DIAGNOSIS — I10 Essential (primary) hypertension: Secondary | ICD-10-CM | POA: Diagnosis not present

## 2021-06-01 DIAGNOSIS — M80052D Age-related osteoporosis with current pathological fracture, left femur, subsequent encounter for fracture with routine healing: Secondary | ICD-10-CM | POA: Diagnosis not present

## 2021-06-07 DIAGNOSIS — J440 Chronic obstructive pulmonary disease with acute lower respiratory infection: Secondary | ICD-10-CM | POA: Diagnosis not present

## 2021-06-07 DIAGNOSIS — M80052D Age-related osteoporosis with current pathological fracture, left femur, subsequent encounter for fracture with routine healing: Secondary | ICD-10-CM | POA: Diagnosis not present

## 2021-06-07 DIAGNOSIS — I10 Essential (primary) hypertension: Secondary | ICD-10-CM | POA: Diagnosis not present

## 2021-06-07 DIAGNOSIS — W1830XD Fall on same level, unspecified, subsequent encounter: Secondary | ICD-10-CM | POA: Diagnosis not present

## 2021-06-07 DIAGNOSIS — M800AXD Age-related osteoporosis with current pathological fracture, other site, subsequent encounter for fracture with routine healing: Secondary | ICD-10-CM | POA: Diagnosis not present

## 2021-06-07 DIAGNOSIS — H543 Unqualified visual loss, both eyes: Secondary | ICD-10-CM | POA: Diagnosis not present

## 2021-06-07 DIAGNOSIS — Z9981 Dependence on supplemental oxygen: Secondary | ICD-10-CM | POA: Diagnosis not present

## 2021-06-08 ENCOUNTER — Other Ambulatory Visit: Payer: Self-pay | Admitting: Family Medicine

## 2021-06-08 DIAGNOSIS — S42475D Nondisplaced transcondylar fracture of left humerus, subsequent encounter for fracture with routine healing: Secondary | ICD-10-CM

## 2021-06-08 DIAGNOSIS — S72002D Fracture of unspecified part of neck of left femur, subsequent encounter for closed fracture with routine healing: Secondary | ICD-10-CM

## 2021-06-08 NOTE — Telephone Encounter (Signed)
Medication Refill - Medication: Oxycodone   Has the patient contacted their pharmacy? No. Pts husband calling on behalf of pt. He states that he didn't call pharmacy due to having no refills. Pt has 4 pills left. Please advise. (Agent: If no, request that the patient contact the pharmacy for the refill.) (Agent: If yes, when and what did the pharmacy advise?)  Preferred Pharmacy (with phone number or street name):  [id number] [address] Phone: [phone]: [phone] Hours: Not open 24 hours    Agent: Please be advised that RX refills may take up to 3 business days. We ask that you follow-up with your pharmacy.

## 2021-06-08 NOTE — Telephone Encounter (Signed)
Patient needs to be called. Unclear if Rx was written for husband or wife.

## 2021-06-09 DIAGNOSIS — S42292A Other displaced fracture of upper end of left humerus, initial encounter for closed fracture: Secondary | ICD-10-CM | POA: Diagnosis not present

## 2021-06-09 DIAGNOSIS — M80059A Age-related osteoporosis with current pathological fracture, unspecified femur, initial encounter for fracture: Secondary | ICD-10-CM | POA: Diagnosis not present

## 2021-06-09 MED ORDER — OXYCODONE HCL 5 MG PO TABS: 5.0000 mg | ORAL_TABLET | ORAL | 0 refills | Status: DC | PRN

## 2021-06-09 NOTE — Telephone Encounter (Signed)
Requested medication (s) are due for refill today:yes  Requested medication (s) are on the active medication list: yes   Last refill: 05/30/21  #30  0 refills  Future visit scheduled no  Notes to clinic  Not delegated  Requested Prescriptions  Pending Prescriptions Disp Refills   oxyCODONE (OXY IR/ROXICODONE) 5 MG immediate release tablet 30 tablet 0    Sig: Take 1 tablet (5 mg total) by mouth every 4 (four) hours as needed.      Not Delegated - Analgesics:  Opioid Agonists Failed - 06/08/2021  5:58 PM      Failed - This refill cannot be delegated      Failed - Urine Drug Screen completed in last 360 days      Passed - Valid encounter within last 6 months    Recent Outpatient Visits           1 week ago Closed fracture of left hip, initial encounter [facility])   [place], DO   4 months ago Acute cystitis with hematuria   [place], DO   6 months ago Mass of right breast   [place], DO   10 months ago Rib pain on right side   [facility], Lupita Raider, FNP   10 months ago Acute cystitis with hematuria   [place], DO       Future Appointments             In 8 months [facility], [facility]

## 2021-06-13 DIAGNOSIS — M80052D Age-related osteoporosis with current pathological fracture, left femur, subsequent encounter for fracture with routine healing: Secondary | ICD-10-CM | POA: Diagnosis not present

## 2021-06-13 DIAGNOSIS — W1830XD Fall on same level, unspecified, subsequent encounter: Secondary | ICD-10-CM | POA: Diagnosis not present

## 2021-06-13 DIAGNOSIS — Z9981 Dependence on supplemental oxygen: Secondary | ICD-10-CM | POA: Diagnosis not present

## 2021-06-13 DIAGNOSIS — H543 Unqualified visual loss, both eyes: Secondary | ICD-10-CM | POA: Diagnosis not present

## 2021-06-13 DIAGNOSIS — J440 Chronic obstructive pulmonary disease with acute lower respiratory infection: Secondary | ICD-10-CM | POA: Diagnosis not present

## 2021-06-13 DIAGNOSIS — I10 Essential (primary) hypertension: Secondary | ICD-10-CM | POA: Diagnosis not present

## 2021-06-13 DIAGNOSIS — M800AXD Age-related osteoporosis with current pathological fracture, other site, subsequent encounter for fracture with routine healing: Secondary | ICD-10-CM | POA: Diagnosis not present

## 2021-06-14 DIAGNOSIS — J440 Chronic obstructive pulmonary disease with acute lower respiratory infection: Secondary | ICD-10-CM | POA: Diagnosis not present

## 2021-06-14 DIAGNOSIS — W1830XD Fall on same level, unspecified, subsequent encounter: Secondary | ICD-10-CM | POA: Diagnosis not present

## 2021-06-14 DIAGNOSIS — M800AXD Age-related osteoporosis with current pathological fracture, other site, subsequent encounter for fracture with routine healing: Secondary | ICD-10-CM | POA: Diagnosis not present

## 2021-06-14 DIAGNOSIS — M80052D Age-related osteoporosis with current pathological fracture, left femur, subsequent encounter for fracture with routine healing: Secondary | ICD-10-CM | POA: Diagnosis not present

## 2021-06-14 DIAGNOSIS — Z9981 Dependence on supplemental oxygen: Secondary | ICD-10-CM | POA: Diagnosis not present

## 2021-06-14 DIAGNOSIS — I10 Essential (primary) hypertension: Secondary | ICD-10-CM | POA: Diagnosis not present

## 2021-06-14 DIAGNOSIS — H543 Unqualified visual loss, both eyes: Secondary | ICD-10-CM | POA: Diagnosis not present

## 2021-06-15 ENCOUNTER — Telehealth: Payer: Self-pay | Admitting: Family Medicine

## 2021-06-15 ENCOUNTER — Other Ambulatory Visit: Payer: Self-pay | Admitting: Family Medicine

## 2021-06-15 DIAGNOSIS — M800AXD Age-related osteoporosis with current pathological fracture, other site, subsequent encounter for fracture with routine healing: Secondary | ICD-10-CM | POA: Diagnosis not present

## 2021-06-15 DIAGNOSIS — M80052D Age-related osteoporosis with current pathological fracture, left femur, subsequent encounter for fracture with routine healing: Secondary | ICD-10-CM | POA: Diagnosis not present

## 2021-06-15 DIAGNOSIS — W1830XD Fall on same level, unspecified, subsequent encounter: Secondary | ICD-10-CM | POA: Diagnosis not present

## 2021-06-15 DIAGNOSIS — I10 Essential (primary) hypertension: Secondary | ICD-10-CM

## 2021-06-15 DIAGNOSIS — Z9981 Dependence on supplemental oxygen: Secondary | ICD-10-CM | POA: Diagnosis not present

## 2021-06-15 DIAGNOSIS — J440 Chronic obstructive pulmonary disease with acute lower respiratory infection: Secondary | ICD-10-CM | POA: Diagnosis not present

## 2021-06-15 DIAGNOSIS — H543 Unqualified visual loss, both eyes: Secondary | ICD-10-CM | POA: Diagnosis not present

## 2021-06-15 NOTE — Telephone Encounter (Signed)
Need Verbal order for OT 2 times a week for 4 weeks

## 2021-06-15 NOTE — Telephone Encounter (Signed)
Okay to give verbal.  Thanks  [name], [facility] 06/15/2021, 5:16 PM

## 2021-06-16 ENCOUNTER — Other Ambulatory Visit: Payer: Self-pay | Admitting: Family Medicine

## 2021-06-16 DIAGNOSIS — S42475D Nondisplaced transcondylar fracture of left humerus, subsequent encounter for fracture with routine healing: Secondary | ICD-10-CM

## 2021-06-16 DIAGNOSIS — S72002D Fracture of unspecified part of neck of left femur, subsequent encounter for closed fracture with routine healing: Secondary | ICD-10-CM

## 2021-06-16 MED ORDER — OXYCODONE HCL 5 MG PO TABS: 5.0000 mg | ORAL_TABLET | Freq: Four times a day (QID) | ORAL | 0 refills | Status: DC | PRN

## 2021-06-16 NOTE — Telephone Encounter (Signed)
Verbal confirmed with Verdis Frederickson.

## 2021-06-16 NOTE — Telephone Encounter (Signed)
Requested medication (s) are due for refill today - yes  Requested medication (s) are on the active medication list -yes  Future visit scheduled -yes  Last refill: 06/08/21  Notes to clinic: Request Rf- non delegated Rx  Requested Prescriptions  Pending Prescriptions Disp Refills   oxyCODONE (OXY IR/ROXICODONE) 5 MG immediate release tablet 30 tablet 0    Sig: Take 1 tablet (5 mg total) by mouth every 4 (four) hours as needed.     Not Delegated - Analgesics:  Opioid Agonists Failed - 06/16/2021 12:16 PM      Failed - This refill cannot be delegated      Failed - Urine Drug Screen completed in last 360 days      Passed - Valid encounter within last 6 months    Recent Outpatient Visits           2 weeks ago Closed fracture of left hip, initial encounter [facility])   [place], DO   4 months ago Acute cystitis with hematuria   [place], DO   7 months ago Mass of right breast   [facility] Olin Hauser, DO   10 months ago Rib pain on right side   [facility], Lupita Raider, FNP   10 months ago Acute cystitis with hematuria   [place], DO       Future Appointments             In 8 months [facility], [facility]                Requested Prescriptions  Pending Prescriptions Disp Refills   oxyCODONE (OXY IR/ROXICODONE) 5 MG immediate release tablet 30 tablet 0    Sig: Take 1 tablet (5 mg total) by mouth every 4 (four) hours as needed.     Not Delegated - Analgesics:  Opioid Agonists Failed - 06/16/2021 12:16 PM      Failed - This refill cannot be delegated      Failed - Urine Drug Screen completed in last 360 days      Passed - Valid encounter within last 6 months    Recent Outpatient Visits           2 weeks ago Closed fracture of left hip, initial encounter [facility])   [place], DO   4 months ago Acute cystitis with hematuria   [place], DO   7 months ago Mass of right breast   [place], DO   10 months ago Rib pain on right side   [facility], Lupita Raider, FNP   10 months ago Acute cystitis with hematuria   [place], DO       Future Appointments             In 8 months [facility], [facility]

## 2021-06-16 NOTE — Telephone Encounter (Signed)
Requested medication (s) are due for refill today: yes  Requested medication (s) are on the active medication list: yes   Last refill:  03/18/2021  Future visit scheduled:no  Notes to clinic:  Last appt for hyperlipidemia was  06/28/2020 Review for refills    Requested Prescriptions  Pending Prescriptions Disp Refills   amLODipine (NORVASC) 10 MG tablet [Pharmacy Med Name: AMLODIPINE BESYLATE 10 MG TAB] 90 tablet 3    Sig: TAKE 1 TABLET BY MOUTH EVERY DAY     Cardiovascular:  Calcium Channel Blockers Passed - 06/16/2021  3:11 AM      Passed - Last BP in normal range    BP Readings from Last 1 Encounters:  03/22/21 139/65          Passed - Valid encounter within last 6 months    Recent Outpatient Visits           2 weeks ago Closed fracture of left hip, initial encounter [facility])   [place], DO   4 months ago Acute cystitis with hematuria   [place], DO   7 months ago Mass of right breast   [place], DO   10 months ago Rib pain on right side   [place], FNP   10 months ago Acute cystitis with hematuria   [place], DO       Future Appointments             In 8 months [facility], [facility]

## 2021-06-16 NOTE — Telephone Encounter (Signed)
Medication Refill - Medication: Oxycodone   Has the patient contacted their pharmacy? No. Pts husband states that they always call this in. Please advise.  (Agent: If no, request that the patient contact the pharmacy for the refill.) (Agent: If yes, when and what did the pharmacy advise?)  Preferred Pharmacy (with phone number or street name):  [id number] [address]  [address] Phone: [phone]: [phone] Hours: Not open 24 hours    Agent: Please be advised that RX refills may take up to 3 business days. We ask that you follow-up with your pharmacy.

## 2021-06-17 DIAGNOSIS — W1830XD Fall on same level, unspecified, subsequent encounter: Secondary | ICD-10-CM | POA: Diagnosis not present

## 2021-06-17 DIAGNOSIS — J440 Chronic obstructive pulmonary disease with acute lower respiratory infection: Secondary | ICD-10-CM | POA: Diagnosis not present

## 2021-06-17 DIAGNOSIS — H543 Unqualified visual loss, both eyes: Secondary | ICD-10-CM | POA: Diagnosis not present

## 2021-06-17 DIAGNOSIS — I10 Essential (primary) hypertension: Secondary | ICD-10-CM | POA: Diagnosis not present

## 2021-06-17 DIAGNOSIS — M80052D Age-related osteoporosis with current pathological fracture, left femur, subsequent encounter for fracture with routine healing: Secondary | ICD-10-CM | POA: Diagnosis not present

## 2021-06-17 DIAGNOSIS — Z9981 Dependence on supplemental oxygen: Secondary | ICD-10-CM | POA: Diagnosis not present

## 2021-06-17 DIAGNOSIS — M800AXD Age-related osteoporosis with current pathological fracture, other site, subsequent encounter for fracture with routine healing: Secondary | ICD-10-CM | POA: Diagnosis not present

## 2021-06-21 DIAGNOSIS — M80052D Age-related osteoporosis with current pathological fracture, left femur, subsequent encounter for fracture with routine healing: Secondary | ICD-10-CM | POA: Diagnosis not present

## 2021-06-21 DIAGNOSIS — S42202D Unspecified fracture of upper end of left humerus, subsequent encounter for fracture with routine healing: Secondary | ICD-10-CM | POA: Diagnosis not present

## 2021-06-21 DIAGNOSIS — W1830XD Fall on same level, unspecified, subsequent encounter: Secondary | ICD-10-CM | POA: Diagnosis not present

## 2021-06-21 DIAGNOSIS — H543 Unqualified visual loss, both eyes: Secondary | ICD-10-CM | POA: Diagnosis not present

## 2021-06-21 DIAGNOSIS — M800AXD Age-related osteoporosis with current pathological fracture, other site, subsequent encounter for fracture with routine healing: Secondary | ICD-10-CM | POA: Diagnosis not present

## 2021-06-21 DIAGNOSIS — Z9981 Dependence on supplemental oxygen: Secondary | ICD-10-CM | POA: Diagnosis not present

## 2021-06-21 DIAGNOSIS — S32402D Unspecified fracture of left acetabulum, subsequent encounter for fracture with routine healing: Secondary | ICD-10-CM | POA: Diagnosis not present

## 2021-06-21 DIAGNOSIS — J449 Chronic obstructive pulmonary disease, unspecified: Secondary | ICD-10-CM | POA: Diagnosis not present

## 2021-06-21 DIAGNOSIS — I10 Essential (primary) hypertension: Secondary | ICD-10-CM | POA: Diagnosis not present

## 2021-06-21 DIAGNOSIS — J440 Chronic obstructive pulmonary disease with acute lower respiratory infection: Secondary | ICD-10-CM | POA: Diagnosis not present

## 2021-06-22 ENCOUNTER — Inpatient Hospital Stay
Admission: EM | Admit: 2021-06-22 | Discharge: 2021-06-27 | DRG: 956 | Disposition: A | Discharge status: Skilled Nursing Facility | Payer: Medicare Other | Attending: Internal Medicine | Admitting: Internal Medicine

## 2021-06-22 ENCOUNTER — Emergency Department: Payer: Medicare Other

## 2021-06-22 DIAGNOSIS — Z743 Need for continuous supervision: Secondary | ICD-10-CM | POA: Diagnosis not present

## 2021-06-22 DIAGNOSIS — S72452A Displaced supracondylar fracture without intracondylar extension of lower end of left femur, initial encounter for closed fracture: Principal | ICD-10-CM | POA: Diagnosis present

## 2021-06-22 DIAGNOSIS — Z7983 Long term (current) use of bisphosphonates: Secondary | ICD-10-CM

## 2021-06-22 DIAGNOSIS — Z20822 Contact with and (suspected) exposure to covid-19: Secondary | ICD-10-CM | POA: Diagnosis present

## 2021-06-22 DIAGNOSIS — Z419 Encounter for procedure for purposes other than remedying health state, unspecified: Secondary | ICD-10-CM

## 2021-06-22 DIAGNOSIS — W19XXXA Unspecified fall, initial encounter: Secondary | ICD-10-CM

## 2021-06-22 DIAGNOSIS — S32512A Fracture of superior rim of left pubis, initial encounter for closed fracture: Secondary | ICD-10-CM | POA: Diagnosis not present

## 2021-06-22 DIAGNOSIS — R296 Repeated falls: Secondary | ICD-10-CM

## 2021-06-22 DIAGNOSIS — Z9049 Acquired absence of other specified parts of digestive tract: Secondary | ICD-10-CM

## 2021-06-22 DIAGNOSIS — R519 Headache, unspecified: Secondary | ICD-10-CM | POA: Diagnosis not present

## 2021-06-22 DIAGNOSIS — M170 Bilateral primary osteoarthritis of knee: Secondary | ICD-10-CM | POA: Diagnosis present

## 2021-06-22 DIAGNOSIS — S32810A Multiple fractures of pelvis with stable disruption of pelvic ring, initial encounter for closed fracture: Secondary | ICD-10-CM | POA: Diagnosis not present

## 2021-06-22 DIAGNOSIS — J309 Allergic rhinitis, unspecified: Secondary | ICD-10-CM | POA: Diagnosis not present

## 2021-06-22 DIAGNOSIS — F419 Anxiety disorder, unspecified: Secondary | ICD-10-CM | POA: Diagnosis present

## 2021-06-22 DIAGNOSIS — I700 Atherosclerosis of aorta: Secondary | ICD-10-CM | POA: Diagnosis not present

## 2021-06-22 DIAGNOSIS — J449 Chronic obstructive pulmonary disease, unspecified: Secondary | ICD-10-CM | POA: Diagnosis not present

## 2021-06-22 DIAGNOSIS — Z79899 Other long term (current) drug therapy: Secondary | ICD-10-CM

## 2021-06-22 DIAGNOSIS — K219 Gastro-esophageal reflux disease without esophagitis: Secondary | ICD-10-CM | POA: Diagnosis present

## 2021-06-22 DIAGNOSIS — Z01818 Encounter for other preprocedural examination: Secondary | ICD-10-CM

## 2021-06-22 DIAGNOSIS — I10 Essential (primary) hypertension: Secondary | ICD-10-CM | POA: Diagnosis present

## 2021-06-22 DIAGNOSIS — S72492A Other fracture of lower end of left femur, initial encounter for closed fracture: Secondary | ICD-10-CM | POA: Diagnosis not present

## 2021-06-22 DIAGNOSIS — R609 Edema, unspecified: Secondary | ICD-10-CM | POA: Diagnosis not present

## 2021-06-22 DIAGNOSIS — H409 Unspecified glaucoma: Secondary | ICD-10-CM | POA: Diagnosis present

## 2021-06-22 DIAGNOSIS — M810 Age-related osteoporosis without current pathological fracture: Secondary | ICD-10-CM | POA: Diagnosis not present

## 2021-06-22 DIAGNOSIS — Z8249 Family history of ischemic heart disease and other diseases of the circulatory system: Secondary | ICD-10-CM

## 2021-06-22 DIAGNOSIS — S0990XA Unspecified injury of head, initial encounter: Secondary | ICD-10-CM | POA: Diagnosis not present

## 2021-06-22 DIAGNOSIS — E785 Hyperlipidemia, unspecified: Secondary | ICD-10-CM | POA: Diagnosis present

## 2021-06-22 DIAGNOSIS — F319 Bipolar disorder, unspecified: Secondary | ICD-10-CM | POA: Diagnosis present

## 2021-06-22 DIAGNOSIS — S32592A Other specified fracture of left pubis, initial encounter for closed fracture: Secondary | ICD-10-CM

## 2021-06-22 DIAGNOSIS — M542 Cervicalgia: Secondary | ICD-10-CM | POA: Diagnosis not present

## 2021-06-22 DIAGNOSIS — S299XXA Unspecified injury of thorax, initial encounter: Secondary | ICD-10-CM | POA: Diagnosis not present

## 2021-06-22 DIAGNOSIS — Z90710 Acquired absence of both cervix and uterus: Secondary | ICD-10-CM

## 2021-06-22 DIAGNOSIS — R0902 Hypoxemia: Secondary | ICD-10-CM | POA: Diagnosis not present

## 2021-06-22 DIAGNOSIS — R5381 Other malaise: Secondary | ICD-10-CM | POA: Diagnosis not present

## 2021-06-22 DIAGNOSIS — S72352A Displaced comminuted fracture of shaft of left femur, initial encounter for closed fracture: Secondary | ICD-10-CM

## 2021-06-22 DIAGNOSIS — W010XXA Fall on same level from slipping, tripping and stumbling without subsequent striking against object, initial encounter: Secondary | ICD-10-CM | POA: Diagnosis present

## 2021-06-22 LAB — CBC WITH DIFFERENTIAL/PLATELET
Abs Immature Granulocytes: 0.01 10*3/uL (ref 0.00–0.07)
Basophils Absolute: 0 10*3/uL (ref 0.0–0.1)
Basophils Relative: 1 %
Eosinophils Absolute: 0.1 10*3/uL (ref 0.0–0.5)
Eosinophils Relative: 2 %
HCT: 41.8 % (ref 36.0–46.0)
Hemoglobin: 13.9 g/dL (ref 12.0–15.0)
Immature Granulocytes: 0 %
Lymphocytes Relative: 37 %
Lymphs Abs: 1.6 10*3/uL (ref 0.7–4.0)
MCH: 33 pg (ref 26.0–34.0)
MCHC: 33.3 g/dL (ref 30.0–36.0)
MCV: 99.3 fL (ref 80.0–100.0)
Monocytes Absolute: 0.5 10*3/uL (ref 0.1–1.0)
Monocytes Relative: 12 %
Neutro Abs: 2.1 10*3/uL (ref 1.7–7.7)
Neutrophils Relative %: 48 %
Platelets: 238 10*3/uL (ref 150–400)
RBC: 4.21 MIL/uL (ref 3.87–5.11)
RDW: 12.9 % (ref 11.5–15.5)
WBC: 4.4 10*3/uL (ref 4.0–10.5)
nRBC: 0 % (ref 0.0–0.2)

## 2021-06-22 LAB — BASIC METABOLIC PANEL
Anion gap: 13 (ref 5–15)
BUN: 10 mg/dL (ref 8–23)
CO2: 26 mmol/L (ref 22–32)
Calcium: 8.8 mg/dL — ABNORMAL LOW (ref 8.9–10.3)
Chloride: 102 mmol/L (ref 98–111)
Creatinine, Ser: 0.57 mg/dL (ref 0.44–1.00)
GFR, Estimated: >60 mL/min (ref >60)
Glucose, Bld: 86 mg/dL (ref 70–99)
Potassium: 3.5 mmol/L (ref 3.5–5.1)
Sodium: 141 mmol/L (ref 135–145)

## 2021-06-22 LAB — CK: Total CK: 389 U/L — ABNORMAL HIGH (ref 38–234)

## 2021-06-22 MED ORDER — ONDANSETRON HCL 4 MG/2ML IJ SOLN
4.0000 mg | Freq: Once | INTRAMUSCULAR | Status: AC
  Administered 2021-06-23: 4 mg via INTRAVENOUS
  Filled 2021-06-22: qty 2

## 2021-06-22 MED ORDER — ACETAMINOPHEN 500 MG PO TABS
1000.0000 mg | ORAL_TABLET | Freq: Once | ORAL | Status: AC
  Administered 2021-06-22: 1000 mg via ORAL
  Filled 2021-06-22: qty 2

## 2021-06-22 MED ORDER — FENTANYL CITRATE (PF) 100 MCG/2ML IJ SOLN
50.0000 ug | Freq: Once | INTRAMUSCULAR | Status: AC
  Administered 2021-06-23: 50 ug via INTRAVENOUS
  Filled 2021-06-22: qty 2

## 2021-06-22 NOTE — ED Triage Notes (Signed)
Pt states she had a mechanical fall today, and reports left lower knee pain and swelling. Reports hitting her head.

## 2021-06-22 NOTE — ED Provider Notes (Addendum)
[facility] Emergency Department Provider Note  ____________________________________________   Event Date/Time   First MD Initiated Contact with Patient 06/22/21 2312     (approximate)  I have reviewed the triage vital signs and the nursing notes.   HISTORY  Chief Complaint Fall    HPI Pamela Ball is a 75 y.o. female with bipolar, recent fall with fractures who comes in with concern for fall.   On review of records patient had a fall on May 03, 2021 and had a left acetabular fracture and left humeral fracture.  Per Ortho these were nonoperative and was weightbearing with assistive devices.  Patient states that she was ambulating and she had a mechanical fall when she tripped on her cane.  Initially she was not sure when she had full and how long she is on the ground for but then later stated that it was sometime around 9:30 PM.  She does report hitting her head.  Denies any LOC.  Patient reporting pain in her left leg that is minimal when she is not moving it but more severe when she tries to move it, mostly located on her knee, has not take anything to help make it better except for a half of a pain medication.  Denies any chest pain, abdominal pain, back pain or any other concerns.          Past Medical History:  Diagnosis Date   Abdominal hernia with obstruction and without gangrene    Anxiety 02/15/2017   denies   Asthma    Bipolar 1 disorder, depressed, full remission ([place]) 02/15/2017   Bipolar affective disorder ([place])    COPD (chronic obstructive pulmonary disease) (HCC)    Depression    GERD (gastroesophageal reflux disease) 02/15/2017   Glaucoma    Headache    Hiatal hernia    History of abdominal hernia 05/21/2017   History of compression fracture of spine 02/15/2017   Hyperlipidemia    Hypertension    Incisional hernia    Incisional ventral hernia w obstruction 06/02/2017   Normocytic anemia 05/23/2017   Osteoarthritis of knees,  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   Presbycusis of both ears 02/15/2017    Patient Active Problem List   Diagnosis Date Noted   Macrocytosis 03/08/2021   Abnormal SPEP 03/08/2021   Rib pain on right side 08/06/2020   Essential hypertension 06/28/2020   Osteopenia of right foot 02/11/2019   Malnutrition of moderate degree 12/27/2018   Lumbar hernia 11/19/2018   Elevated troponin 09/26/2018   Incisional hernia, without obstruction or gangrene 08/28/2018   Allergic rhinitis due to allergen 05/14/2018   Abdominal hernia with obstruction and without gangrene    Normocytic anemia 05/23/2017   History of abdominal hernia 05/21/2017   Bipolar 1 disorder, depressed, full remission ([place]) 02/15/2017   GERD (gastroesophageal reflux disease) 02/15/2017   Centrilobular emphysema ([place]) 02/15/2017   Osteoarthritis of knees, 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   Hyperlipidemia 02/15/2017   Presbycusis of both ears 02/15/2017   History of compression fracture of spine 02/15/2017   Anxiety 02/15/2017    Past Surgical History:  Procedure Laterality Date   ABDOMINAL HYSTERECTOMY  11/06/2006   APPENDECTOMY     BREAST BIOPSY Right 12/14/2020   stereo bx of distortion/asymmetry, coil marker, path pending   BREAST EXCISIONAL BIOPSY Left    age 61- benign    CESAREAN SECTION     x3   COLONOSCOPY WITH PROPOFOL N/A 06/25/2018   Procedure: COLONOSCOPY WITH PROPOFOL;  Surgeon: Vicente Males,  Bailey Mech, MD;  Location: [facility];  Service: Endoscopy;  Laterality: N/A;   KYPHOPLASTY     KYPHOPLASTY N/A 09/23/2018   Procedure: KYPHOPLASTY T10;  Surgeon: Hessie Knows, MD;  Location: [facility];  Service: Orthopedics;  Laterality: N/A;   KYPHOPLASTY N/A 03/11/2020   Procedure: L3 KYPHOPLASTY;  Surgeon: Hessie Knows, MD;  Location: [facility];  Service: Orthopedics;  Laterality: N/A;   KYPHOPLASTY N/A 12/30/2020   Procedure: L5 KYPHOPLASTY;  Surgeon: Hessie Knows, MD;  Location: [facility];  Service: Orthopedics;  Laterality: N/A;   ORIF HUMERUS  FRACTURE Left 08/07/2019   Procedure: OPEN REDUCTION INTERNAL FIXATION (ORIF) LEFT DISTAL HUMERUS FRACTURE;  Surgeon: Altamese [place], MD;  Location: [facility];  Service: Orthopedics;  Laterality: Left;   VENTRAL HERNIA REPAIR N/A 06/02/2017   Procedure: exploratory laparotomy repair of incarcerated  VENTRAL hernia;  Surgeon: Florene Glen, MD;  Location: [facility];  Service: General;  Laterality: N/A;   VENTRAL HERNIA REPAIR N/A 08/29/2018   Procedure: LAPAROSCOPIC INCISIONAL HERNIA;  Surgeon: Olean Ree, MD;  Location: [facility];  Service: General;  Laterality: N/A;    Prior to Admission medications   Medication Sig Start Date End Date Taking? Authorizing Provider  albuterol (VENTOLIN HFA) 108 (90 Base) MCG/ACT inhaler INHALE 2 PUFFS INTO THE LUNGS EVERY 4 HOURS AS NEEDED FOR WHEEZING OR SHORTNESS OF BREATH (COUGH) Patient taking differently: Inhale 1-2 puffs into the lungs every 4 (four) hours as needed for shortness of breath or wheezing. 06/28/20   Olin Hauser, DO  alendronate (FOSAMAX) 70 MG tablet Take by mouth. Take 1 tablet (70 mg total) by mouth every 7 (seven) days Take with a full glass of water. Do not lie down for the next 30 min. 02/27/21 02/27/22  [provider]  amLODipine (NORVASC) 10 MG tablet TAKE 1 TABLET BY MOUTH EVERY DAY 06/16/21   Parks Ranger, Devonne Doughty, DO  buPROPion (WELLBUTRIN XL) 150 MG 24 hr tablet Take 150 mg by mouth daily. 01/11/15   [provider]  busPIRone (BUSPAR) 10 MG tablet Take 10 mg by mouth daily. Prescribed by Psychiatry Dr Kasandra Knudsen 11/14/17   Parks Ranger, Devonne Doughty, DO  Calcium Citrate-Vitamin D (CALCIUM + D PO) Take by mouth.    [provider]  Cholecalciferol (VITAMIN D3) 50 MCG (2000 UT) TABS Take 2,000 Units by mouth daily.    [provider]  ciprofloxacin (CIPRO) 500 MG tablet Take 1 tablet (500 mg total) by mouth 2 (two) times daily. For 7 days Patient not taking: No sig reported 03/10/21   Olin Hauser, DO  Cyanocobalamin (VITAMIN B-12) 3000 MCG SUBL Take 3,000 mcg by mouth daily.    [provider]  doxycycline (MONODOX) 100 MG capsule Take 100 mg by mouth 2 (two) times daily. 05/10/21   [provider]  Ferrous Fumarate (IRON) 18 MG TBCR Take 18 mg by mouth daily. W/Rice Protein    [provider]  ibuprofen (ADVIL) 600 MG tablet TAKE 1 TABLET BY MOUTH EVERY 8 HOURS AS NEEDED. 11/21/20   Parks Ranger, Devonne Doughty, DO  mirtazapine (REMERON) 15 MG tablet Take 7.5 mg by mouth daily. 05/10/21   [provider]  Multiple Vitamin (MULTIVITAMIN WITH MINERALS) TABS tablet Take 1 tablet by mouth daily.    [provider]  Multiple Vitamins-Minerals (PRESERVISION AREDS) TABS Take 1 tablet by mouth daily.    [provider]  OLANZapine (ZYPREXA) 7.5 MG tablet Take 7.5 mg by mouth daily. 12/24/19   [provider]  omeprazole (PRILOSEC) 20 MG capsule TAKE 1 CAPSULE (20 MG TOTAL) BY MOUTH DAILY BEFORE BREAKFAST. 12/16/20   Karamalegos, Devonne Doughty, DO  ondansetron (ZOFRAN-ODT) 4 MG disintegrating tablet TAKE 1 TABLET BY MOUTH EVERY 8 HOURS AS NEEDED FOR NAUSEA AND VOMITING 05/20/21   Parks Ranger, Devonne Doughty, DO  oxyCODONE (OXY IR/ROXICODONE) 5 MG immediate release tablet Take 1 tablet (5 mg total) by mouth every 6 (six) hours as needed. 06/16/21   Karamalegos, Devonne Doughty, DO  simvastatin (ZOCOR) 20 MG tablet TAKE 1 TABLET BY MOUTH EVERY DAY IN THE MORNING Patient taking differently: Take 20 mg by mouth daily. 12/16/20   Parks Ranger, Devonne Doughty, DO  venlafaxine XR (EFFEXOR-XR) 75 MG 24 hr capsule Take 75 mg by mouth daily with breakfast.  01/11/15   [provider]  vitamin C (ASCORBIC ACID) 500 MG tablet Take 500 mg by mouth daily.    [provider]    Allergies Patient has no known allergies.  Family History  Problem Relation Age of Onset   Cancer Mother        cancer   Breast cancer Mother 69   Hypertension Father      Social History Social History   Tobacco Use   Smoking status: Never   Smokeless tobacco: Never  Vaping Use   Vaping Use: Never used  Substance Use Topics   Alcohol use: No    Comment: drink occasionally    Drug use: No      Review of Systems Constitutional: No fever/chills, fall Eyes: No visual changes. ENT: No sore throat. Cardiovascular: Denies chest pain. Respiratory: Denies shortness of breath. Gastrointestinal: No abdominal pain.  No nausea, no vomiting.  No diarrhea.  No constipation. Genitourinary: Negative for dysuria. Musculoskeletal: Negative for back pain.  Positive knee pain Skin: Negative for rash. Neurological: Negative for headaches, focal weakness or numbness. All other ROS negative ____________________________________________   PHYSICAL EXAM:  VITAL SIGNS: ED Triage Vitals  Enc Vitals Group     BP 06/22/21 2310 (!) 170/64     Pulse Rate 06/22/21 2310 92     Resp 06/22/21 2310 20     Temp 06/22/21 2310 98.6 F (37 C)     Temp Source 06/22/21 2310 Oral     SpO2 06/22/21 2310 93 %     Weight --      Height --      Head Circumference --      Peak Flow --      Pain Score 06/22/21 2312 5     Pain Loc --      Pain Edu? --      Excl. in [place]? --     Constitutional: Alert and oriented. GCS 15  Eyes: Conjunctivae are normal. EOMI. Head: Atraumatic. Nose: No congestion/rhinnorhea. Mouth/Throat: Mucous membranes are moist.   Neck: No stridor. Trachea Midline. FROM Cardiovascular: Normal rate, regular rhythm. Grossly normal heart sounds.  Good peripheral circulation. No chest wall tenderness Respiratory: Normal respiratory effort.  No retractions. Lungs CTAB. Gastrointestinal: Soft and nontender. No distention. No abdominal bruits.  Musculoskeletal:   RUE: No point tenderness, deformity or other signs of injury. Radial pulse intact. Neuro intact. Full ROM in joint. LUE: No new point tenderness, patient reports some chronic pain from her prior  shoulder fracture.  Deformity or other signs of injury. Radial pulse intact. Neuro intact. Full ROM in joints RLE: No point tenderness, deformity or other signs of injury. DP pulse intact. Neuro intact. Full ROM in  joints. LLE: Limited range of motion mostly at the knee.  No significant pain on the hip but most the pain is in the lower femur, knee region.  No ankle or foot pain.  2+ distal pulse.   Neurologic:  Normal speech and language. No gross focal neurologic deficits are appreciated.  Skin:  Skin is warm, dry and intact. No rash noted. Psychiatric: Mood and affect are normal. Speech and behavior are normal. GU: Deferred   ____________________________________________   LABS (all labs ordered are listed, but only abnormal results are displayed)  Labs Reviewed  CBC WITH DIFFERENTIAL/PLATELET  BASIC METABOLIC PANEL  CK   ____________________________________________   RADIOLOGY Robert Bellow, personally viewed and evaluated these images (plain radiographs) as part of my medical decision making, as well as reviewing the written report by the radiologist.  ED MD interpretation:   Official radiology report(s): DG Tibia/Fibula Left  Result Date: 06/23/2021 CLINICAL DATA:  Fall and left lower extremity pain. EXAM: DG HIP (WITH OR WITHOUT PELVIS) 2-3V LEFT; LEFT KNEE - COMPLETE 4+ VIEW; LEFT TIBIA AND FIBULA - 2 VIEW COMPARISON:  None. FINDINGS: There is a comminuted, displaced, and mildly impacted fracture of the distal femur. There is dorsal angulation of the distal fracture fragment. There are displaced fractures of the left superior and inferior pubic rami, age indeterminate, likely acute. There is no dislocation. The bones are osteopenic. Lower lumbar vertebroplasty. Ventral hernia repair mesh. IMPRESSION: 1. Comminuted, displaced and angulated fracture of the distal femur. 2. Displaced fractures of the left superior and inferior pubic rami, likely acute. Electronically Signed   By:  Anner Crete M.D.   On: 06/23/2021 00:19   CT HEAD WO CONTRAST (5MM)  Result Date: 06/22/2021 CLINICAL DATA:  Recent fall with headaches and neck pain, initial encounter EXAM: CT HEAD WITHOUT CONTRAST CT CERVICAL SPINE WITHOUT CONTRAST TECHNIQUE: Multidetector CT imaging of the head and cervical spine was performed following the standard protocol without intravenous contrast. Multiplanar CT image reconstructions of the cervical spine were also generated. COMPARISON:  05/29/2020 FINDINGS: CT HEAD FINDINGS Brain: Mild atrophic changes are noted. 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 No acute hemorrhage or acute infarct is noted. Vascular: No hyperdense vessel or unexpected calcification. Skull: Normal. Negative for fracture or focal lesion. Sinuses/Orbits: Mucosal retention cyst is noted within sphenoid sinus Other: None CT CERVICAL SPINE FINDINGS Alignment: Within normal limits. Skull base and vertebrae: 7 cervical segments are well visualized. Vertebral body height is well maintained. Multi level disc space narrowing from C4-C7 is noted with osteophytic change. Facet hypertrophic changes are noted as well. No acute fracture or acute facet abnormality is noted. Soft tissues and spinal canal: Surrounding soft tissue structures are within normal limits. Upper chest: Visualized lung apices are within normal limits. Other: None IMPRESSION: CT of the head: Mild atrophic changes are noted. Lacunar infarct in the right basal ganglia unchanged from the prior exam CT of the cervical spine: Multilevel degenerative change without acute abnormality. Electronically Signed   By: Inez Catalina M.D.   On: 06/22/2021 23:58   CT Cervical Spine Wo Contrast  Result Date: 06/22/2021 CLINICAL DATA:  Recent fall with headaches and neck pain, initial encounter EXAM: CT HEAD WITHOUT CONTRAST CT CERVICAL SPINE WITHOUT CONTRAST TECHNIQUE: Multidetector CT imaging of the head and cervical spine was  performed following the standard protocol without intravenous contrast. Multiplanar CT image reconstructions of the cervical spine were also generated. COMPARISON:  05/29/2020 FINDINGS: CT HEAD  FINDINGS Brain: Mild atrophic changes are noted. 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 No acute hemorrhage or acute infarct is noted. Vascular: No hyperdense vessel or unexpected calcification. Skull: Normal. Negative for fracture or focal lesion. Sinuses/Orbits: Mucosal retention cyst is noted within sphenoid sinus Other: None CT CERVICAL SPINE FINDINGS Alignment: Within normal limits. Skull base and vertebrae: 7 cervical segments are well visualized. Vertebral body height is well maintained. Multi level disc space narrowing from C4-C7 is noted with osteophytic change. Facet hypertrophic changes are noted as well. No acute fracture or acute facet abnormality is noted. Soft tissues and spinal canal: Surrounding soft tissue structures are within normal limits. Upper chest: Visualized lung apices are within normal limits. Other: None IMPRESSION: CT of the head: Mild atrophic changes are noted. Lacunar infarct in the right basal ganglia unchanged from the prior exam CT of the cervical spine: Multilevel degenerative change without acute abnormality. Electronically Signed   By: Inez Catalina M.D.   On: 06/22/2021 23:58   DG Chest Portable 1 View  Result Date: 06/23/2021 CLINICAL DATA:  History of recent fall with known femoral fractures,, initial encounter EXAM: PORTABLE CHEST 1 VIEW COMPARISON:  12/26/2018 FINDINGS: Cardiac shadow is mildly prominent. Aortic calcifications are noted. The lungs are well aerated bilaterally. No focal infiltrate or effusion is seen. Chronic deformity of the proximal humeri are seen. IMPRESSION: Chronic changes without acute abnormality. Electronically Signed   By: Inez Catalina M.D.   On: 06/23/2021 00:16   DG Knee Complete 4 Views Left  Result Date:  06/23/2021 CLINICAL DATA:  Fall and left lower extremity pain. EXAM: DG HIP (WITH OR WITHOUT PELVIS) 2-3V LEFT; LEFT KNEE - COMPLETE 4+ VIEW; LEFT TIBIA AND FIBULA - 2 VIEW COMPARISON:  None. FINDINGS: There is a comminuted, displaced, and mildly impacted fracture of the distal femur. There is dorsal angulation of the distal fracture fragment. There are displaced fractures of the left superior and inferior pubic rami, age indeterminate, likely acute. There is no dislocation. The bones are osteopenic. Lower lumbar vertebroplasty. Ventral hernia repair mesh. IMPRESSION: 1. Comminuted, displaced and angulated fracture of the distal femur. 2. Displaced fractures of the left superior and inferior pubic rami, likely acute. Electronically Signed   By: Anner Crete M.D.   On: 06/23/2021 00:19   DG Hip Unilat W or Wo Pelvis 2-3 Views Left  Result Date: 06/23/2021 CLINICAL DATA:  Fall and left lower extremity pain. EXAM: DG HIP (WITH OR WITHOUT PELVIS) 2-3V LEFT; LEFT KNEE - COMPLETE 4+ VIEW; LEFT TIBIA AND FIBULA - 2 VIEW COMPARISON:  None. FINDINGS: There is a comminuted, displaced, and mildly impacted fracture of the distal femur. There is dorsal angulation of the distal fracture fragment. There are displaced fractures of the left superior and inferior pubic rami, age indeterminate, likely acute. There is no dislocation. The bones are osteopenic. Lower lumbar vertebroplasty. Ventral hernia repair mesh. IMPRESSION: 1. Comminuted, displaced and angulated fracture of the distal femur. 2. Displaced fractures of the left superior and inferior pubic rami, likely acute. Electronically Signed   By: Anner Crete M.D.   On: 06/23/2021 00:19    ____________________________________________   PROCEDURES  Procedure(s) performed (including Critical Care):  Procedures   ____________________________________________   INITIAL IMPRESSION / ASSESSMENT AND PLAN / ED COURSE  As part of my medical decision making, I  reviewed the following data within the [place] was evaluated in Emergency Department on 06/22/2021  for the symptoms described in the history of present illness. She was evaluated in the context of the global COVID-19 pandemic, which necessitated consideration that the patient might be at risk for infection with the SARS-CoV-2 virus that causes COVID-19. Institutional protocols and algorithms that pertain to the evaluation of patients at risk for COVID-19 are in a state of rapid change based on information released by regulatory bodies including the CDC and federal and state organizations. These policies and algorithms were followed during the patient's care in the ED.    Patient comes in with a mechanical fall.  Patient did hit her head will get CT head evaluate for intercranial hemorrhage and CT cervical to evaluate for cervical fracture.  Will get some x-rays to evaluate the leg for any type of fracture, dislocation and get a preop chest x-ray especially if her sats were 93%.  We will continue to closely monitor    Patient had prior left acetabular fracture.  However today's x-ray does show comminuted, displaced and angulated fracture of the distal femur as well as left superior and inferior   CT head and neck are stable pubic rami fractures  Discussed with Dr. Rudene Christians who states that we can admit patient here for evaluation for surgery.  On repeat evaluation prior to admission patient remains neurovascularly intact.   ____________________________________________   FINAL CLINICAL IMPRESSION(S) / ED DIAGNOSES   Final diagnoses:  Closed displaced comminuted fracture of shaft of left femur, initial encounter ([place])  Multiple closed fractures of pelvis with stable disruption of pelvic ring, initial encounter ([place])  Fall, initial encounter      MEDICATIONS GIVEN DURING THIS VISIT:  Medications  acetaminophen (TYLENOL) tablet 1,000 mg (1,000 mg Oral Given  06/22/21 2318)  fentaNYL (SUBLIMAZE) injection 50 mcg (50 mcg Intravenous Given 06/23/21 0027)  ondansetron (ZOFRAN) injection 4 mg (4 mg Intravenous Given 06/23/21 0028)     ED Discharge Orders     None        Note:  This document was prepared using Dragon voice recognition software and may include unintentional dictation errors.    Vanessa Matheny, MD 06/23/21 0037    Vanessa [place], MD 06/23/21 [phone]

## 2021-06-22 NOTE — ED Notes (Signed)
Pt is in CT at this time.

## 2021-06-23 ENCOUNTER — Encounter: Payer: Self-pay | Admitting: Internal Medicine

## 2021-06-23 ENCOUNTER — Other Ambulatory Visit: Payer: Self-pay

## 2021-06-23 ENCOUNTER — Encounter
Admission: EM | Disposition: A | Discharge status: Skilled Nursing Facility | Payer: Self-pay | Source: Home / Self Care | Attending: Internal Medicine

## 2021-06-23 ENCOUNTER — Inpatient Hospital Stay: Payer: Medicare Other | Admitting: Anesthesiology

## 2021-06-23 ENCOUNTER — Inpatient Hospital Stay: Payer: Medicare Other

## 2021-06-23 DIAGNOSIS — E440 Moderate protein-calorie malnutrition: Secondary | ICD-10-CM | POA: Diagnosis not present

## 2021-06-23 DIAGNOSIS — Z01818 Encounter for other preprocedural examination: Secondary | ICD-10-CM

## 2021-06-23 DIAGNOSIS — J449 Chronic obstructive pulmonary disease, unspecified: Secondary | ICD-10-CM | POA: Diagnosis not present

## 2021-06-23 DIAGNOSIS — E876 Hypokalemia: Secondary | ICD-10-CM | POA: Diagnosis not present

## 2021-06-23 DIAGNOSIS — R2689 Other abnormalities of gait and mobility: Secondary | ICD-10-CM | POA: Diagnosis not present

## 2021-06-23 DIAGNOSIS — Z7983 Long term (current) use of bisphosphonates: Secondary | ICD-10-CM | POA: Diagnosis not present

## 2021-06-23 DIAGNOSIS — S72452A Displaced supracondylar fracture without intracondylar extension of lower end of left femur, initial encounter for closed fracture: Secondary | ICD-10-CM | POA: Diagnosis present

## 2021-06-23 DIAGNOSIS — H409 Unspecified glaucoma: Secondary | ICD-10-CM | POA: Diagnosis present

## 2021-06-23 DIAGNOSIS — R5381 Other malaise: Secondary | ICD-10-CM | POA: Diagnosis not present

## 2021-06-23 DIAGNOSIS — S72492A Other fracture of lower end of left femur, initial encounter for closed fracture: Secondary | ICD-10-CM | POA: Diagnosis present

## 2021-06-23 DIAGNOSIS — M25552 Pain in left hip: Secondary | ICD-10-CM | POA: Diagnosis not present

## 2021-06-23 DIAGNOSIS — Z8249 Family history of ischemic heart disease and other diseases of the circulatory system: Secondary | ICD-10-CM | POA: Diagnosis not present

## 2021-06-23 DIAGNOSIS — R279 Unspecified lack of coordination: Secondary | ICD-10-CM | POA: Diagnosis not present

## 2021-06-23 DIAGNOSIS — M6281 Muscle weakness (generalized): Secondary | ICD-10-CM | POA: Diagnosis not present

## 2021-06-23 DIAGNOSIS — F319 Bipolar disorder, unspecified: Secondary | ICD-10-CM | POA: Diagnosis present

## 2021-06-23 DIAGNOSIS — S72402A Unspecified fracture of lower end of left femur, initial encounter for closed fracture: Secondary | ICD-10-CM | POA: Diagnosis not present

## 2021-06-23 DIAGNOSIS — Z90710 Acquired absence of both cervix and uterus: Secondary | ICD-10-CM | POA: Diagnosis not present

## 2021-06-23 DIAGNOSIS — W19XXXD Unspecified fall, subsequent encounter: Secondary | ICD-10-CM | POA: Diagnosis not present

## 2021-06-23 DIAGNOSIS — S72455A Nondisplaced supracondylar fracture without intracondylar extension of lower end of left femur, initial encounter for closed fracture: Secondary | ICD-10-CM | POA: Diagnosis not present

## 2021-06-23 DIAGNOSIS — I10 Essential (primary) hypertension: Secondary | ICD-10-CM | POA: Diagnosis not present

## 2021-06-23 DIAGNOSIS — Z20822 Contact with and (suspected) exposure to covid-19: Secondary | ICD-10-CM | POA: Diagnosis present

## 2021-06-23 DIAGNOSIS — S72492D Other fracture of lower end of left femur, subsequent encounter for closed fracture with routine healing: Secondary | ICD-10-CM | POA: Diagnosis not present

## 2021-06-23 DIAGNOSIS — Z79899 Other long term (current) drug therapy: Secondary | ICD-10-CM | POA: Diagnosis not present

## 2021-06-23 DIAGNOSIS — H9113 Presbycusis, bilateral: Secondary | ICD-10-CM | POA: Diagnosis not present

## 2021-06-23 DIAGNOSIS — J309 Allergic rhinitis, unspecified: Secondary | ICD-10-CM | POA: Diagnosis present

## 2021-06-23 DIAGNOSIS — M810 Age-related osteoporosis without current pathological fracture: Secondary | ICD-10-CM | POA: Diagnosis not present

## 2021-06-23 DIAGNOSIS — W010XXA Fall on same level from slipping, tripping and stumbling without subsequent striking against object, initial encounter: Secondary | ICD-10-CM | POA: Diagnosis present

## 2021-06-23 DIAGNOSIS — S72351A Displaced comminuted fracture of shaft of right femur, initial encounter for closed fracture: Secondary | ICD-10-CM | POA: Diagnosis not present

## 2021-06-23 DIAGNOSIS — S299XXA Unspecified injury of thorax, initial encounter: Secondary | ICD-10-CM | POA: Diagnosis not present

## 2021-06-23 DIAGNOSIS — E785 Hyperlipidemia, unspecified: Secondary | ICD-10-CM | POA: Diagnosis present

## 2021-06-23 DIAGNOSIS — S72352A Displaced comminuted fracture of shaft of left femur, initial encounter for closed fracture: Secondary | ICD-10-CM | POA: Diagnosis not present

## 2021-06-23 DIAGNOSIS — Z9049 Acquired absence of other specified parts of digestive tract: Secondary | ICD-10-CM | POA: Diagnosis not present

## 2021-06-23 DIAGNOSIS — R296 Repeated falls: Secondary | ICD-10-CM

## 2021-06-23 DIAGNOSIS — M6259 Muscle wasting and atrophy, not elsewhere classified, multiple sites: Secondary | ICD-10-CM | POA: Diagnosis not present

## 2021-06-23 DIAGNOSIS — S32512A Fracture of superior rim of left pubis, initial encounter for closed fracture: Secondary | ICD-10-CM | POA: Diagnosis not present

## 2021-06-23 DIAGNOSIS — K5900 Constipation, unspecified: Secondary | ICD-10-CM | POA: Diagnosis not present

## 2021-06-23 DIAGNOSIS — F419 Anxiety disorder, unspecified: Secondary | ICD-10-CM | POA: Diagnosis present

## 2021-06-23 DIAGNOSIS — S32810A Multiple fractures of pelvis with stable disruption of pelvic ring, initial encounter for closed fracture: Secondary | ICD-10-CM | POA: Diagnosis present

## 2021-06-23 DIAGNOSIS — M170 Bilateral primary osteoarthritis of knee: Secondary | ICD-10-CM | POA: Diagnosis not present

## 2021-06-23 DIAGNOSIS — S32592A Other specified fracture of left pubis, initial encounter for closed fracture: Secondary | ICD-10-CM

## 2021-06-23 DIAGNOSIS — W19XXXA Unspecified fall, initial encounter: Secondary | ICD-10-CM

## 2021-06-23 DIAGNOSIS — K219 Gastro-esophageal reflux disease without esophagitis: Secondary | ICD-10-CM | POA: Diagnosis not present

## 2021-06-23 DIAGNOSIS — I700 Atherosclerosis of aorta: Secondary | ICD-10-CM | POA: Diagnosis not present

## 2021-06-23 HISTORY — PX: ORIF FEMUR FRACTURE: SHX2119

## 2021-06-23 LAB — CBC
HCT: 39.9 % (ref 36.0–46.0)
Hemoglobin: 13.3 g/dL (ref 12.0–15.0)
MCH: 33.4 pg (ref 26.0–34.0)
MCHC: 33.3 g/dL (ref 30.0–36.0)
MCV: 100.3 fL — ABNORMAL HIGH (ref 80.0–100.0)
Platelets: 233 10*3/uL (ref 150–400)
RBC: 3.98 MIL/uL (ref 3.87–5.11)
RDW: 12.9 % (ref 11.5–15.5)
WBC: 6.6 10*3/uL (ref 4.0–10.5)
nRBC: 0 % (ref 0.0–0.2)

## 2021-06-23 LAB — RESP PANEL BY RT-PCR (FLU A&B, COVID) ARPGX2
Influenza A by PCR: NEGATIVE
Influenza B by PCR: NEGATIVE
SARS Coronavirus 2 by RT PCR: NEGATIVE

## 2021-06-23 LAB — CREATININE, SERUM
Creatinine, Ser: 0.49 mg/dL (ref 0.44–1.00)
GFR, Estimated: >60 mL/min (ref >60)

## 2021-06-23 SURGERY — OPEN REDUCTION INTERNAL FIXATION (ORIF) DISTAL FEMUR FRACTURE
Anesthesia: General | Laterality: Left

## 2021-06-23 MED ORDER — MAGNESIUM HYDROXIDE 400 MG/5ML PO SUSP
30.0000 mL | Freq: Every day | ORAL | Status: DC | PRN
  Administered 2021-06-25: 30 mL via ORAL
  Filled 2021-06-23: qty 30

## 2021-06-23 MED ORDER — METOCLOPRAMIDE HCL 10 MG PO TABS
5.0000 mg | ORAL_TABLET | Freq: Three times a day (TID) | ORAL | Status: DC | PRN
  Administered 2021-06-26: 10 mg via ORAL
  Filled 2021-06-23: qty 1

## 2021-06-23 MED ORDER — VITAMIN B-12 1000 MCG PO TABS
3000.0000 ug | ORAL_TABLET | Freq: Every day | ORAL | Status: DC
  Administered 2021-06-24 – 2021-06-27 (×4): 3000 ug via ORAL
  Filled 2021-06-23 (×5): qty 3

## 2021-06-23 MED ORDER — AMLODIPINE BESYLATE 10 MG PO TABS
10.0000 mg | ORAL_TABLET | Freq: Every day | ORAL | Status: DC
  Administered 2021-06-24 – 2021-06-27 (×4): 10 mg via ORAL
  Filled 2021-06-23 (×4): qty 1

## 2021-06-23 MED ORDER — SODIUM CHLORIDE 0.9 % IR SOLN
Status: DC | PRN
  Administered 2021-06-23: 1004 mL

## 2021-06-23 MED ORDER — ACETAMINOPHEN 10 MG/ML IV SOLN
INTRAVENOUS | Status: AC
  Filled 2021-06-23: qty 100

## 2021-06-23 MED ORDER — PHENYLEPHRINE HCL (PRESSORS) 10 MG/ML IV SOLN
INTRAVENOUS | Status: DC | PRN
  Administered 2021-06-23: 100 ug via INTRAVENOUS

## 2021-06-23 MED ORDER — PHENOL 1.4 % MT LIQD
1.0000 | OROMUCOSAL | Status: DC | PRN
  Administered 2021-06-25: 1 via OROMUCOSAL
  Filled 2021-06-23 (×2): qty 177

## 2021-06-23 MED ORDER — SENNOSIDES-DOCUSATE SODIUM 8.6-50 MG PO TABS: 1.0000 | ORAL_TABLET | Freq: Every evening | ORAL | Status: DC | PRN

## 2021-06-23 MED ORDER — CEFAZOLIN SODIUM-DEXTROSE 2-4 GM/100ML-% IV SOLN
2.0000 g | Freq: Four times a day (QID) | INTRAVENOUS | Status: AC
  Administered 2021-06-23 – 2021-06-24 (×3): 2 g via INTRAVENOUS
  Filled 2021-06-23 (×3): qty 100

## 2021-06-23 MED ORDER — DOCUSATE SODIUM 100 MG PO CAPS
100.0000 mg | ORAL_CAPSULE | Freq: Two times a day (BID) | ORAL | Status: DC
  Administered 2021-06-24 – 2021-06-27 (×7): 100 mg via ORAL
  Filled 2021-06-23 (×8): qty 1

## 2021-06-23 MED ORDER — SODIUM CHLORIDE 0.9 % IV SOLN: INTRAVENOUS | Status: DC

## 2021-06-23 MED ORDER — ACETAMINOPHEN 10 MG/ML IV SOLN
INTRAVENOUS | Status: DC | PRN
  Administered 2021-06-23: 750 mg via INTRAVENOUS

## 2021-06-23 MED ORDER — DEXAMETHASONE SODIUM PHOSPHATE 10 MG/ML IJ SOLN
INTRAMUSCULAR | Status: AC
  Filled 2021-06-23: qty 1

## 2021-06-23 MED ORDER — LACTATED RINGERS IV SOLN: INTRAVENOUS | Status: DC | PRN

## 2021-06-23 MED ORDER — VITAMIN B-12 3000 MCG SL SUBL: 3000.0000 ug | SUBLINGUAL_TABLET | Freq: Every day | SUBLINGUAL | Status: DC

## 2021-06-23 MED ORDER — PROPOFOL 10 MG/ML IV BOLUS
INTRAVENOUS | Status: DC | PRN
  Administered 2021-06-23: 60 mg via INTRAVENOUS
  Administered 2021-06-23: 30 mg via INTRAVENOUS

## 2021-06-23 MED ORDER — ALUM & MAG HYDROXIDE-SIMETH 200-200-20 MG/5ML PO SUSP: 30.0000 mL | ORAL | Status: DC | PRN

## 2021-06-23 MED ORDER — FENTANYL CITRATE (PF) 100 MCG/2ML IJ SOLN
INTRAMUSCULAR | Status: DC | PRN
  Administered 2021-06-23 (×2): 50 ug via INTRAVENOUS

## 2021-06-23 MED ORDER — METOCLOPRAMIDE HCL 5 MG/ML IJ SOLN: 5.0000 mg | Freq: Three times a day (TID) | INTRAMUSCULAR | Status: DC | PRN

## 2021-06-23 MED ORDER — FERROUS SULFATE 325 (65 FE) MG PO TABS
325.0000 mg | ORAL_TABLET | Freq: Every day | ORAL | Status: DC
  Administered 2021-06-24 – 2021-06-27 (×5): 325 mg via ORAL
  Filled 2021-06-23 (×4): qty 1

## 2021-06-23 MED ORDER — ZOLPIDEM TARTRATE 5 MG PO TABS
5.0000 mg | ORAL_TABLET | Freq: Every evening | ORAL | Status: DC | PRN
  Administered 2021-06-25 – 2021-06-27 (×2): 5 mg via ORAL
  Filled 2021-06-23 (×2): qty 1

## 2021-06-23 MED ORDER — PANTOPRAZOLE SODIUM 40 MG PO TBEC
40.0000 mg | DELAYED_RELEASE_TABLET | Freq: Every day | ORAL | Status: DC
Start: 2021-06-23 — End: 2021-06-27
  Administered 2021-06-24 – 2021-06-27 (×4): 40 mg via ORAL
  Filled 2021-06-23 (×4): qty 1

## 2021-06-23 MED ORDER — SUGAMMADEX SODIUM 200 MG/2ML IV SOLN
INTRAVENOUS | Status: DC | PRN
  Administered 2021-06-23: 150 mg via INTRAVENOUS

## 2021-06-23 MED ORDER — OCUVITE-LUTEIN PO CAPS
1.0000 | ORAL_CAPSULE | Freq: Every day | ORAL | Status: DC
  Administered 2021-06-24 – 2021-06-27 (×4): 1 via ORAL
  Filled 2021-06-23 (×4): qty 1

## 2021-06-23 MED ORDER — OXYCODONE HCL 5 MG/5ML PO SOLN: 5.0000 mg | Freq: Once | ORAL | Status: DC | PRN

## 2021-06-23 MED ORDER — BISACODYL 10 MG RE SUPP: 10.0000 mg | Freq: Every day | RECTAL | Status: DC | PRN

## 2021-06-23 MED ORDER — MORPHINE SULFATE (PF) 2 MG/ML IV SOLN
2.0000 mg | INTRAVENOUS | Status: DC | PRN
  Administered 2021-06-23 – 2021-06-24 (×6): 2 mg via INTRAVENOUS
  Filled 2021-06-23 (×6): qty 1

## 2021-06-23 MED ORDER — HYDROCODONE-ACETAMINOPHEN 5-325 MG PO TABS
1.0000 | ORAL_TABLET | Freq: Four times a day (QID) | ORAL | Status: DC | PRN
  Administered 2021-06-23: 2 via ORAL
  Administered 2021-06-24: 1 via ORAL
  Filled 2021-06-23: qty 2
  Filled 2021-06-23: qty 1

## 2021-06-23 MED ORDER — ROCURONIUM BROMIDE 100 MG/10ML IV SOLN
INTRAVENOUS | Status: DC | PRN
  Administered 2021-06-23: 40 mg via INTRAVENOUS

## 2021-06-23 MED ORDER — OXYCODONE HCL 5 MG PO TABS: 5.0000 mg | ORAL_TABLET | Freq: Once | ORAL | Status: DC | PRN

## 2021-06-23 MED ORDER — SIMVASTATIN 20 MG PO TABS
20.0000 mg | ORAL_TABLET | Freq: Every day | ORAL | Status: DC
  Administered 2021-06-24 – 2021-06-26 (×3): 20 mg via ORAL
  Filled 2021-06-23 (×4): qty 1

## 2021-06-23 MED ORDER — ONDANSETRON HCL 4 MG/2ML IJ SOLN
4.0000 mg | Freq: Four times a day (QID) | INTRAMUSCULAR | Status: DC | PRN
  Administered 2021-06-24: 4 mg via INTRAVENOUS
  Filled 2021-06-23: qty 2

## 2021-06-23 MED ORDER — ONDANSETRON HCL 4 MG/2ML IJ SOLN
INTRAMUSCULAR | Status: AC
  Filled 2021-06-23: qty 2

## 2021-06-23 MED ORDER — ONDANSETRON HCL 4 MG/2ML IJ SOLN
INTRAMUSCULAR | Status: DC | PRN
  Administered 2021-06-23: 4 mg via INTRAVENOUS

## 2021-06-23 MED ORDER — METHOCARBAMOL 500 MG PO TABS
500.0000 mg | ORAL_TABLET | Freq: Four times a day (QID) | ORAL | Status: DC | PRN
  Administered 2021-06-24 – 2021-06-26 (×5): 500 mg via ORAL
  Filled 2021-06-23 (×6): qty 1

## 2021-06-23 MED ORDER — FENTANYL CITRATE (PF) 100 MCG/2ML IJ SOLN
INTRAMUSCULAR | Status: AC
  Filled 2021-06-23: qty 2

## 2021-06-23 MED ORDER — VENLAFAXINE HCL ER 75 MG PO CP24
75.0000 mg | ORAL_CAPSULE | Freq: Every day | ORAL | Status: DC
  Administered 2021-06-24 – 2021-06-27 (×4): 75 mg via ORAL
  Filled 2021-06-23 (×6): qty 1

## 2021-06-23 MED ORDER — ALBUTEROL SULFATE (2.5 MG/3ML) 0.083% IN NEBU: 3.0000 mL | INHALATION_SOLUTION | RESPIRATORY_TRACT | Status: DC | PRN

## 2021-06-23 MED ORDER — ASCORBIC ACID 500 MG PO TABS
500.0000 mg | ORAL_TABLET | Freq: Every day | ORAL | Status: DC
  Administered 2021-06-24 – 2021-06-27 (×4): 500 mg via ORAL
  Filled 2021-06-23 (×4): qty 1

## 2021-06-23 MED ORDER — LABETALOL HCL 5 MG/ML IV SOLN
INTRAVENOUS | Status: DC | PRN
  Administered 2021-06-23 (×2): 5 mg via INTRAVENOUS

## 2021-06-23 MED ORDER — IRON 18 MG PO TBCR: 18.0000 mg | EXTENDED_RELEASE_TABLET | Freq: Every day | ORAL | Status: DC

## 2021-06-23 MED ORDER — LIDOCAINE HCL (CARDIAC) PF 100 MG/5ML IV SOSY
PREFILLED_SYRINGE | INTRAVENOUS | Status: DC | PRN
  Administered 2021-06-23: 50 mg via INTRAVENOUS

## 2021-06-23 MED ORDER — MENTHOL 3 MG MT LOZG
1.0000 | LOZENGE | OROMUCOSAL | Status: DC | PRN
  Filled 2021-06-23 (×2): qty 9

## 2021-06-23 MED ORDER — FENTANYL CITRATE (PF) 100 MCG/2ML IJ SOLN: 25.0000 ug | INTRAMUSCULAR | Status: DC | PRN

## 2021-06-23 MED ORDER — OLANZAPINE 5 MG PO TABS
7.5000 mg | ORAL_TABLET | Freq: Every day | ORAL | Status: DC
  Administered 2021-06-24 – 2021-06-27 (×5): 7.5 mg via ORAL
  Filled 2021-06-23 (×4): qty 2

## 2021-06-23 MED ORDER — ENOXAPARIN SODIUM 40 MG/0.4ML IJ SOSY
40.0000 mg | PREFILLED_SYRINGE | INTRAMUSCULAR | Status: DC
  Administered 2021-06-24 – 2021-06-27 (×4): 40 mg via SUBCUTANEOUS
  Filled 2021-06-23 (×4): qty 0.4

## 2021-06-23 MED ORDER — ONDANSETRON HCL 4 MG PO TABS: 4.0000 mg | ORAL_TABLET | Freq: Four times a day (QID) | ORAL | Status: DC | PRN

## 2021-06-23 MED ORDER — DEXAMETHASONE SODIUM PHOSPHATE 10 MG/ML IJ SOLN
INTRAMUSCULAR | Status: DC | PRN
  Administered 2021-06-23: 5 mg via INTRAVENOUS

## 2021-06-23 MED ORDER — TRANEXAMIC ACID-NACL 1000-0.7 MG/100ML-% IV SOLN
1000.0000 mg | Freq: Once | INTRAVENOUS | Status: AC
  Administered 2021-06-23: 1000 mg via INTRAVENOUS
  Filled 2021-06-23: qty 100

## 2021-06-23 MED ORDER — VITAMIN D3 25 MCG (1000 UNIT) PO TABS
2000.0000 [IU] | ORAL_TABLET | Freq: Every day | ORAL | Status: DC
  Administered 2021-06-24 – 2021-06-27 (×4): 2000 [IU] via ORAL
  Filled 2021-06-23 (×8): qty 2

## 2021-06-23 MED ORDER — ROCURONIUM BROMIDE 10 MG/ML (PF) SYRINGE
PREFILLED_SYRINGE | INTRAVENOUS | Status: AC
  Filled 2021-06-23: qty 20

## 2021-06-23 MED ORDER — CEFAZOLIN SODIUM-DEXTROSE 1-4 GM/50ML-% IV SOLN
INTRAVENOUS | Status: AC
  Filled 2021-06-23: qty 50

## 2021-06-23 MED ORDER — BUSPIRONE HCL 10 MG PO TABS
10.0000 mg | ORAL_TABLET | Freq: Every day | ORAL | Status: DC
  Administered 2021-06-24 – 2021-06-27 (×4): 10 mg via ORAL
  Filled 2021-06-23 (×4): qty 1

## 2021-06-23 MED ORDER — BUPROPION HCL ER (XL) 150 MG PO TB24
150.0000 mg | ORAL_TABLET | Freq: Every day | ORAL | Status: DC
  Administered 2021-06-24 – 2021-06-27 (×4): 150 mg via ORAL
  Filled 2021-06-23 (×4): qty 1

## 2021-06-23 MED ORDER — CEFAZOLIN (ANCEF) 1 G IV SOLR: 1.0000 g | INTRAVENOUS | Status: DC

## 2021-06-23 MED ORDER — CEFAZOLIN SODIUM-DEXTROSE 1-4 GM/50ML-% IV SOLN
1.0000 g | INTRAVENOUS | Status: AC
  Administered 2021-06-23: 1 g via INTRAVENOUS
  Filled 2021-06-23: qty 50

## 2021-06-23 MED ORDER — CHLORHEXIDINE GLUCONATE CLOTH 2 % EX PADS
6.0000 | MEDICATED_PAD | Freq: Every day | CUTANEOUS | Status: DC
  Administered 2021-06-26: 6 via TOPICAL

## 2021-06-23 MED ORDER — METHOCARBAMOL 1000 MG/10ML IJ SOLN
500.0000 mg | Freq: Four times a day (QID) | INTRAVENOUS | Status: DC | PRN
  Filled 2021-06-23: qty 5

## 2021-06-23 MED ORDER — OCUVITE-LUTEIN PO CAPS: 1.0000 | ORAL_CAPSULE | Freq: Every day | ORAL | Status: DC

## 2021-06-23 MED ORDER — LIDOCAINE HCL (PF) 2 % IJ SOLN
INTRAMUSCULAR | Status: AC
  Filled 2021-06-23: qty 5

## 2021-06-23 SURGICAL SUPPLY — 49 items
BIT DRILL CANN QC 4.3X180 (BIT) ×1 IMPLANT
BIT DRILL GUIDEWIRE 2.5X200 (WIRE) ×6 IMPLANT
BIT DRILL Q/COUPLING 1 (BIT) ×2 IMPLANT
CANISTER SUCT 1200ML W/VALVE (MISCELLANEOUS) IMPLANT
CHLORAPREP W/TINT 26 (MISCELLANEOUS) ×2 IMPLANT
DRAPE C-ARM XRAY 36X54 (DRAPES) ×2 IMPLANT
DRAPE C-ARMOR (DRAPES) ×2 IMPLANT
DRILL BIT 4.3MM (BIT) ×2
ELECT REM PT RETURN 9FT ADLT (ELECTROSURGICAL) ×2
ELECTRODE REM PT RTRN 9FT ADLT (ELECTROSURGICAL) ×1 IMPLANT
GAUZE 4X4 16PLY ~~LOC~~+RFID DBL (SPONGE) ×2 IMPLANT
GAUZE SPONGE 4X4 12PLY STRL (GAUZE/BANDAGES/DRESSINGS) ×2 IMPLANT
GAUZE XEROFORM 1X8 LF (GAUZE/BANDAGES/DRESSINGS) ×2 IMPLANT
GLOVE SURG SYN 9.0  PF PI (GLOVE) ×1
GLOVE SURG SYN 9.0 PF PI (GLOVE) ×1 IMPLANT
GLOVE SURG UNDER POLY LF SZ9 (GLOVE) ×2 IMPLANT
GOWN SRG 2XL LVL 4 RGLN SLV (GOWNS) ×1 IMPLANT
GOWN STRL NON-REIN 2XL LVL4 (GOWNS) ×1
GOWN STRL REUS W/ TWL LRG LVL3 (GOWN DISPOSABLE) ×1 IMPLANT
GOWN STRL REUS W/TWL LRG LVL3 (GOWN DISPOSABLE) ×1
HEMOVAC 400ML (MISCELLANEOUS)
KIT DRAIN HEMOVAC JP 7FR 400ML (MISCELLANEOUS) IMPLANT
KIT TURNOVER KIT A (KITS) ×2 IMPLANT
MANIFOLD NEPTUNE II (INSTRUMENTS) ×2 IMPLANT
MAT ABSORB  FLUID 56X50 GRAY (MISCELLANEOUS) ×1
MAT ABSORB FLUID 56X50 GRAY (MISCELLANEOUS) ×1 IMPLANT
NEEDLE FILTER BLUNT 18X 1/2SAF (NEEDLE) ×1
NEEDLE FILTER BLUNT 18X1 1/2 (NEEDLE) ×1 IMPLANT
NS IRRIG 1000ML POUR BTL (IV SOLUTION) ×2 IMPLANT
NS IRRIG 500ML POUR BTL (IV SOLUTION) IMPLANT
PACK HIP PROSTHESIS (MISCELLANEOUS) ×2 IMPLANT
PLATE LT 6HOLE (Plate) ×2 IMPLANT
SCALPEL PROTECTED #10 DISP (BLADE) ×4 IMPLANT
SCREW CORTEX ST 4.5X34 (Screw) ×4 IMPLANT
SCREW CORTEX ST 4.5X36 (Screw) ×2 IMPLANT
SCREW CORTEX ST 4.5X38 (Screw) IMPLANT
SCREW CORTEX ST 4.5X42 (Screw) ×2 IMPLANT
SCREW LOCK VA 5.0X30 (Screw) ×2 IMPLANT
SCREW LOCKING VA 5.0X60MM (Screw) ×6 IMPLANT
SCREW LOCKING VA 5.0X70MM (Screw) ×2 IMPLANT
SCREW VA LOCKING 5.0X50MM (Screw) ×2 IMPLANT
SPONGE T-LAP 18X18 ~~LOC~~+RFID (SPONGE) ×6 IMPLANT
STAPLER SKIN PROX 35W (STAPLE) ×2 IMPLANT
SUT VIC AB 0 CT1 36 (SUTURE) ×4 IMPLANT
SUT VIC AB 2-0 CT1 27 (SUTURE) ×2
SUT VIC AB 2-0 CT1 TAPERPNT 27 (SUTURE) ×2 IMPLANT
SYR 5ML LL (SYRINGE) ×2 IMPLANT
TAPE MICROFOAM 4IN (TAPE) ×2 IMPLANT
TOWEL OR 17X26 4PK STRL BLUE (TOWEL DISPOSABLE) ×2 IMPLANT

## 2021-06-23 NOTE — Op Note (Signed)
06/23/2021  4:04 PM  PATIENT:  Pamela Ball  75 y.o. 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 POST-OPERATIVE DIAGNOSIS:  Distal Left Femur same  PROCEDURE:  Procedure(s): OPEN REDUCTION INTERNAL FIXATION (ORIF) DISTAL FEMUR FRACTURE (Left)  SURGEON: Laurene Footman, MD  ASSISTANTS: Tommie Ard, RNFA  ANESTHESIA:   general  EBL:  Total I/O In: 425 [I.V.:300; IV Piggyback:125] Out: -   BLOOD ADMINISTERED:none  DRAINS: none   LOCAL MEDICATIONS USED:  NONE  SPECIMEN:  No Specimen  DISPOSITION OF SPECIMEN:  N/A  COUNTS:  YES  TOURNIQUET:  * No tourniquets in log *  IMPLANTS: Left 6-hole supracondylar locking plate with Synthes with multiple screws distally  DICTATION: .Dragon Dictation Problem patient brought the operating room and after adequate general anesthesia was obtained the left leg was prepped and draped you sterile fashion with alright will from driving condition reduction.  C-arm was brought in and good visualization of the fragment was obtained in AP and lateral projections.  Appropriate patient verification timeout procedure completed and a bump was placed underneath the distal shaft and with the knee slightly flexed this gave anatomic alignment on the lateral view and with slight varus pressure on the tibia near anatomic closed alignment.  Distal approach was made first with the incision centered over the lateral femoral condyle the IT band split hemostased to electrocautery.  After adequate exposure the lateral femoral condyle 6-hole plate was determined to be the appropriate length and was slid subcu muscular up with a lateral shift with wire holding the plate in position C arm showed good position of both AP and lateral projections.  K wire was then sent proximal across the most proximal hole to make sure the plate did not end up falling into flexed for more posterior position.  The distal screw holes were  all filled using the drill guide surfer locking screws drilling measuring and placing the screws with torque limiter screwdriver to tighten them.  The proximal screws were placed through percutaneous screw holes set for most distal which could be done through the initial incision this brought the plate close to the shaft onto the shaft proximally site In the midportion but solid fixation to stress views.  Permanent AP lateral images were obtained in the operating room at this point and the wounds were thoroughly irrigated with the IT band closed with a running #1 Vicryl 2-0 Vicryl subcutaneously and skin staples.  Xeroform 4 x 4's ABD and foam tape applied.  PLAN OF CARE: Continue as inpatient  PATIENT DISPOSITION:  PACU - hemodynamically stable.

## 2021-06-23 NOTE — ED Notes (Signed)
Pt back from CT and x ray

## 2021-06-23 NOTE — Consult Note (Signed)
Reason for Consult: Left distal femur fracture comminuted Referring Physician: Dr. Gerlene Burdock is an 75 y.o. female.  HPI: Patient is a 74 year old with treated in the past for compression fractures.  She has severe osteoporosis and suffered a fall after tripping over her cane.  She is recently been recovering from a shoulder and acetabular fracture that she suffered nearly 2 months ago [place] near [place].  They are treated nonoperatively.  She now is at home after lengthy rehab stay.  Denies loss of consciousness.  She fell directly onto her left knee had immediate pain and deformity unable to bear weight  Past Medical History:  Diagnosis Date   Abdominal hernia with obstruction and without gangrene    Anxiety 02/15/2017   denies   Asthma    Bipolar 1 disorder, depressed, full remission ([place]) 02/15/2017   Bipolar affective disorder (HCC)    COPD (chronic obstructive pulmonary disease) (HCC)    Depression    GERD (gastroesophageal reflux disease) 02/15/2017   Glaucoma    Headache    Hiatal hernia    History of abdominal hernia 05/21/2017   History of compression fracture of spine 02/15/2017   Hyperlipidemia    Hypertension    Incisional hernia    Incisional ventral hernia w obstruction 06/02/2017   Normocytic anemia 05/23/2017   Osteoarthritis of knees, 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   Presbycusis of both ears 02/15/2017    Past Surgical History:  Procedure Laterality Date   ABDOMINAL HYSTERECTOMY  11/06/2006   APPENDECTOMY     BREAST BIOPSY Right 12/14/2020   stereo bx of distortion/asymmetry, coil marker, path pending   BREAST EXCISIONAL BIOPSY Left    age 64- benign    CESAREAN SECTION     x3   COLONOSCOPY WITH PROPOFOL N/A 06/25/2018   Procedure: COLONOSCOPY WITH PROPOFOL;  Surgeon: Jonathon Bellows, MD;  Location: [facility] ENDOSCOPY;  Service: Endoscopy;  Laterality: N/A;   KYPHOPLASTY     KYPHOPLASTY N/A 09/23/2018   Procedure: KYPHOPLASTY T10;  Surgeon: Hessie Knows, MD;   Location: [facility] ORS;  Service: Orthopedics;  Laterality: N/A;   KYPHOPLASTY N/A 03/11/2020   Procedure: L3 KYPHOPLASTY;  Surgeon: Hessie Knows, MD;  Location: [facility] ORS;  Service: Orthopedics;  Laterality: N/A;   KYPHOPLASTY N/A 12/30/2020   Procedure: L5 KYPHOPLASTY;  Surgeon: Hessie Knows, MD;  Location: [facility] ORS;  Service: Orthopedics;  Laterality: N/A;   ORIF HUMERUS FRACTURE Left 08/07/2019   Procedure: OPEN REDUCTION INTERNAL FIXATION (ORIF) LEFT DISTAL HUMERUS FRACTURE;  Surgeon: Altamese [place]uff, MD;  Location: [facility];  Service: Orthopedics;  Laterality: Left;   VENTRAL HERNIA REPAIR N/A 06/02/2017   Procedure: exploratory laparotomy repair of incarcerated  VENTRAL hernia;  Surgeon: Florene Glen, MD;  Location: [facility] ORS;  Service: General;  Laterality: N/A;   VENTRAL HERNIA REPAIR N/A 08/29/2018   Procedure: LAPAROSCOPIC INCISIONAL HERNIA;  Surgeon: Olean Ree, MD;  Location: [facility] ORS;  Service: General;  Laterality: N/A;    Family History  Problem Relation Age of Onset   Cancer Mother        cancer   Breast cancer Mother 42   Hypertension Father     Social History:  reports that she has never smoked. She has never used smokeless tobacco. She reports that she does not drink alcohol and does not use drugs.  Allergies: No Known Allergies  Medications: I have reviewed the patient's current medications.  Results for orders placed or performed during the hospital encounter of 06/22/21 (from the  past 48 hour(s))  CBC with Differential     Status: None   Collection Time: 06/22/21 11:17 PM  Result Value Ref Range   WBC 4.4 4.0 - 10.5 K/uL   RBC 4.21 3.87 - 5.11 MIL/uL   Hemoglobin 13.9 12.0 - 15.0 g/dL   HCT 41.8 36.0 - 46.0 %   MCV 99.3 80.0 - 100.0 fL   MCH 33.0 26.0 - 34.0 pg   MCHC 33.3 30.0 - 36.0 g/dL   RDW 12.9 11.5 - 15.5 %   Platelets 238 150 - 400 K/uL   nRBC 0.0 0.0 - 0.2 %   Neutrophils Relative % 48 %   Neutro Abs 2.1 1.7 - 7.7 K/uL   Lymphocytes Relative 37 %    Lymphs Abs 1.6 0.7 - 4.0 K/uL   Monocytes Relative 12 %   Monocytes Absolute 0.5 0.1 - 1.0 K/uL   Eosinophils Relative 2 %   Eosinophils Absolute 0.1 0.0 - 0.5 K/uL   Basophils Relative 1 %   Basophils Absolute 0.0 0.0 - 0.1 K/uL   Immature Granulocytes 0 %   Abs Immature Granulocytes 0.01 0.00 - 0.07 K/uL    Comment: Performed at [facility], [address], [place], [place] [id number]  Basic metabolic panel     Status: Abnormal   Collection Time: 06/22/21 11:17 PM  Result Value Ref Range   Sodium 141 135 - 145 mmol/L   Potassium 3.5 3.5 - 5.1 mmol/L   Chloride 102 98 - 111 mmol/L   CO2 26 22 - 32 mmol/L   Glucose, Bld 86 70 - 99 mg/dL    Comment: Glucose reference range applies only to samples taken after fasting for at least 8 hours.   BUN 10 8 - 23 mg/dL   Creatinine, Ser 0.57 0.44 - 1.00 mg/dL   Calcium 8.8 (L) 8.9 - 10.3 mg/dL   GFR, Estimated >60 >60 mL/min    Comment: (NOTE) Calculated using the CKD-EPI Creatinine Equation (2021)    Anion gap 13 5 - 15    Comment: Performed at [facility], [address] [place], [place] [address]  CK     Status: Abnormal   Collection Time: 06/22/21 11:17 PM  Result Value Ref Range   Total CK 389 (H) 38 - 234 U/L    Comment: Performed at [facility], [address], [place], [place] [address]  Resp Panel by RT-PCR (Flu A&B, Covid) Nasopharyngeal Swab     Status: None   Collection Time: 06/23/21  1:01 AM   Specimen: Nasopharyngeal Swab; Nasopharyngeal(NP) swabs in vial transport medium  Result Value Ref Range   SARS Coronavirus 2 by RT PCR NEGATIVE NEGATIVE    Comment: (NOTE) SARS-CoV-2 target nucleic acids are NOT DETECTED.  The SARS-CoV-2 RNA is generally detectable in upper respiratory specimens during the acute phase of infection. The lowest concentration of SARS-CoV-2 viral copies this assay can detect is 138 copies/mL. A negative result does not preclude SARS-Cov-2 infection and should not  be used as the sole basis for treatment or other patient management decisions. A negative result may occur with  improper specimen collection/handling, submission of specimen other than nasopharyngeal swab, presence of viral mutation(s) within the areas targeted by this assay, and inadequate number of viral copies(<138 copies/mL). A negative result must be combined with clinical observations, patient history, and epidemiological information. The expected result is Negative.  Fact Sheet for Patients:  EntrepreneurPulse.com.au  Fact Sheet for Healthcare Providers:  IncredibleEmployment.be  This test is no t yet approved or cleared by the [place] and  has been authorized for detection and/or diagnosis of SARS-CoV-2 by FDA under an Emergency Use Authorization (EUA). This EUA will remain  in effect (meaning this test can be used) for the duration of the COVID-19 declaration under Section 564(b)(1) of the Act, 21 U.S.C.section 360bbb-3(b)(1), unless the authorization is terminated  or revoked sooner.       Influenza A by PCR NEGATIVE NEGATIVE   Influenza B by PCR NEGATIVE NEGATIVE    Comment: (NOTE) The Xpert Xpress SARS-CoV-2/FLU/RSV plus assay is intended as an aid in the diagnosis of influenza from Nasopharyngeal swab specimens and should not be used as a sole basis for treatment. Nasal washings and aspirates are unacceptable for Xpert Xpress SARS-CoV-2/FLU/RSV testing.  Fact Sheet for Patients: EntrepreneurPulse.com.au  Fact Sheet for Healthcare Providers: IncredibleEmployment.be  This test is not yet approved or cleared by the [place] FDA and has been authorized for detection and/or diagnosis of SARS-CoV-2 by FDA under an Emergency Use Authorization (EUA). This EUA will remain in effect (meaning this test can be used) for the duration of the COVID-19 declaration under Section 564(b)(1) of  the Act, 21 U.S.C. section 360bbb-3(b)(1), unless the authorization is terminated or revoked.  Performed at [facility], [address] [place], [place] [address]     DG Tibia/Fibula Left  Result Date: 06/23/2021 CLINICAL DATA:  Fall and left lower extremity pain. EXAM: DG HIP (WITH OR WITHOUT PELVIS) 2-3V LEFT; LEFT KNEE - COMPLETE 4+ VIEW; LEFT TIBIA AND FIBULA - 2 VIEW COMPARISON:  None. FINDINGS: There is a comminuted, displaced, and mildly impacted fracture of the distal femur. There is dorsal angulation of the distal fracture fragment. There are displaced fractures of the left superior and inferior pubic rami, age indeterminate, likely acute. There is no dislocation. The bones are osteopenic. Lower lumbar vertebroplasty. Ventral hernia repair mesh. IMPRESSION: 1. Comminuted, displaced and angulated fracture of the distal femur. 2. Displaced fractures of the left superior and inferior pubic rami, likely acute. Electronically Signed   By: Anner Crete M.D.   On: 06/23/2021 00:19   CT HEAD WO CONTRAST (5MM)  Result Date: 06/22/2021 CLINICAL DATA:  Recent fall with headaches and neck pain, initial encounter EXAM: CT HEAD WITHOUT CONTRAST CT CERVICAL SPINE WITHOUT CONTRAST TECHNIQUE: Multidetector CT imaging of the head and cervical spine was performed following the standard protocol without intravenous contrast. Multiplanar CT image reconstructions of the cervical spine were also generated. COMPARISON:  05/29/2020 FINDINGS: CT HEAD FINDINGS Brain: Mild atrophic changes are noted. 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 No acute hemorrhage or acute infarct is noted. Vascular: No hyperdense vessel or unexpected calcification. Skull: Normal. Negative for fracture or focal lesion. Sinuses/Orbits: Mucosal retention cyst is noted within sphenoid sinus Other: None CT CERVICAL SPINE FINDINGS Alignment: Within normal limits. Skull base and vertebrae: 7 cervical  segments are well visualized. Vertebral body height is well maintained. Multi level disc space narrowing from C4-C7 is noted with osteophytic change. Facet hypertrophic changes are noted as well. No acute fracture or acute facet abnormality is noted. Soft tissues and spinal canal: Surrounding soft tissue structures are within normal limits. Upper chest: Visualized lung apices are within normal limits. Other: None IMPRESSION: CT of the head: Mild atrophic changes are noted. Lacunar infarct in the right basal ganglia unchanged from the prior exam CT of the cervical spine: Multilevel degenerative change without acute  abnormality. Electronically Signed   By: Inez Catalina M.D.   On: 06/22/2021 23:58   CT Cervical Spine Wo Contrast  Result Date: 06/22/2021 CLINICAL DATA:  Recent fall with headaches and neck pain, initial encounter EXAM: CT HEAD WITHOUT CONTRAST CT CERVICAL SPINE WITHOUT CONTRAST TECHNIQUE: Multidetector CT imaging of the head and cervical spine was performed following the standard protocol without intravenous contrast. Multiplanar CT image reconstructions of the cervical spine were also generated. COMPARISON:  05/29/2020 FINDINGS: CT HEAD FINDINGS Brain: Mild atrophic changes are noted. 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 No acute hemorrhage or acute infarct is noted. Vascular: No hyperdense vessel or unexpected calcification. Skull: Normal. Negative for fracture or focal lesion. Sinuses/Orbits: Mucosal retention cyst is noted within sphenoid sinus Other: None CT CERVICAL SPINE FINDINGS Alignment: Within normal limits. Skull base and vertebrae: 7 cervical segments are well visualized. Vertebral body height is well maintained. Multi level disc space narrowing from C4-C7 is noted with osteophytic change. Facet hypertrophic changes are noted as well. No acute fracture or acute facet abnormality is noted. Soft tissues and spinal canal: Surrounding soft tissue structures are  within normal limits. Upper chest: Visualized lung apices are within normal limits. Other: None IMPRESSION: CT of the head: Mild atrophic changes are noted. Lacunar infarct in the right basal ganglia unchanged from the prior exam CT of the cervical spine: Multilevel degenerative change without acute abnormality. Electronically Signed   By: Inez Catalina M.D.   On: 06/22/2021 23:58   DG Chest Portable 1 View  Result Date: 06/23/2021 CLINICAL DATA:  History of recent fall with known femoral fractures,, initial encounter EXAM: PORTABLE CHEST 1 VIEW COMPARISON:  12/26/2018 FINDINGS: Cardiac shadow is mildly prominent. Aortic calcifications are noted. The lungs are well aerated bilaterally. No focal infiltrate or effusion is seen. Chronic deformity of the proximal humeri are seen. IMPRESSION: Chronic changes without acute abnormality. Electronically Signed   By: Inez Catalina M.D.   On: 06/23/2021 00:16   DG Knee Complete 4 Views Left  Result Date: 06/23/2021 CLINICAL DATA:  Fall and left lower extremity pain. EXAM: DG HIP (WITH OR WITHOUT PELVIS) 2-3V LEFT; LEFT KNEE - COMPLETE 4+ VIEW; LEFT TIBIA AND FIBULA - 2 VIEW COMPARISON:  None. FINDINGS: There is a comminuted, displaced, and mildly impacted fracture of the distal femur. There is dorsal angulation of the distal fracture fragment. There are displaced fractures of the left superior and inferior pubic rami, age indeterminate, likely acute. There is no dislocation. The bones are osteopenic. Lower lumbar vertebroplasty. Ventral hernia repair mesh. IMPRESSION: 1. Comminuted, displaced and angulated fracture of the distal femur. 2. Displaced fractures of the left superior and inferior pubic rami, likely acute. Electronically Signed   By: Anner Crete M.D.   On: 06/23/2021 00:19   DG Hip Unilat W or Wo Pelvis 2-3 Views Left  Result Date: 06/23/2021 CLINICAL DATA:  Fall and left lower extremity pain. EXAM: DG HIP (WITH OR WITHOUT PELVIS) 2-3V LEFT; LEFT KNEE  - COMPLETE 4+ VIEW; LEFT TIBIA AND FIBULA - 2 VIEW COMPARISON:  None. FINDINGS: There is a comminuted, displaced, and mildly impacted fracture of the distal femur. There is dorsal angulation of the distal fracture fragment. There are displaced fractures of the left superior and inferior pubic rami, age indeterminate, likely acute. There is no dislocation. The bones are osteopenic. Lower lumbar vertebroplasty. Ventral hernia repair mesh. IMPRESSION: 1. Comminuted, displaced and angulated fracture of the distal femur. 2. Displaced fractures  of the left superior and inferior pubic rami, likely acute. Electronically Signed   By: Anner Crete M.D.   On: 06/23/2021 00:19    Review of Systems Blood pressure (!) 144/69, pulse 98, temperature 98.6 F (37 C), temperature source Oral, resp. rate 20, SpO2 97 %. Physical Exam She is able flex extend the toes and has palpable pulses. There is ecchymosis around the distal femur and anterior knee with skin intact There is flexion deformity to the knee Assessment/Plan: Displaced supracondylar femur fracture with intercondylar extension in a patient with severe osteoporosis Plan is for open reduction internal fixation with locking plate.  She will probably require rehab stay as she will need to be partial weightbearing postoperatively.  Hessie Knows 06/23/2021, 7:36 AM

## 2021-06-23 NOTE — Progress Notes (Signed)
Pt unable to void. Bladder scan 864. MD notified, order for foley to be placed.

## 2021-06-23 NOTE — Anesthesia Preprocedure Evaluation (Addendum)
Anesthesia Evaluation  Patient identified by MRN, date of birth, ID band Patient awake    Reviewed: Allergy & Precautions, NPO status , Patient's Chart, lab work & pertinent test results  History of Anesthesia Complications Negative for: history of anesthetic complications  Airway Mallampati: III  TM Distance: >3 FB Neck ROM: full    Dental  (+) Chipped, Missing, Poor Dentition   Pulmonary asthma , COPD,    Pulmonary exam normal        Cardiovascular Exercise Tolerance: Good hypertension, Normal cardiovascular exam     Neuro/Psych  Headaches, PSYCHIATRIC DISORDERS    GI/Hepatic Neg liver ROS, hiatal hernia, GERD  Medicated and Controlled,  Endo/Other  negative endocrine ROS  Renal/GU      Musculoskeletal  (+) Arthritis ,   Abdominal   Peds  Hematology negative hematology ROS (+)   Anesthesia Other Findings Past Medical History: No date: Abdominal hernia with obstruction and without gangrene 02/15/2017: Anxiety     Comment:  denies No date: Asthma 02/15/2017: Bipolar 1 disorder, depressed, full remission ([facility]) No date: Bipolar affective disorder ([place]) No date: COPD (chronic obstructive pulmonary disease) ([place]) No date: Depression 02/15/2017: GERD (gastroesophageal reflux disease) No date: Glaucoma No date: Headache No date: Hiatal hernia 05/21/2017: History of abdominal hernia 02/15/2017: History of compression fracture of spine No date: Hyperlipidemia No date: Hypertension No date: Incisional hernia 06/02/2017: Incisional ventral hernia w obstruction 05/23/2017: Normocytic anemia 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, 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  Past Surgical History: 11/06/2006: ABDOMINAL HYSTERECTOMY No date: APPENDECTOMY 12/14/2020: BREAST BIOPSY; Right     Comment:  stereo bx of distortion/asymmetry, coil marker, path               pending No date: BREAST EXCISIONAL BIOPSY; Left      Comment:  age 54- benign  No date: CESAREAN SECTION     Comment:  x3 06/25/2018: COLONOSCOPY WITH PROPOFOL; N/A     Comment:  Procedure: COLONOSCOPY WITH PROPOFOL;  Surgeon: Jonathon Bellows, MD;  Location: [facility] ENDOSCOPY;  Service:               Endoscopy;  Laterality: N/A; No date: KYPHOPLASTY 09/23/2018: KYPHOPLASTY; N/A     Comment:  Procedure: KYPHOPLASTY T10;  Surgeon: Hessie Knows, MD;              Location: [facility] ORS;  Service: Orthopedics;  Laterality:               N/A; 03/11/2020: KYPHOPLASTY; N/A     Comment:  Procedure: L3 KYPHOPLASTY;  Surgeon: Hessie Knows, MD;               Location: [facility] ORS;  Service: Orthopedics;  Laterality:               N/A; 12/30/2020: KYPHOPLASTY; N/A     Comment:  Procedure: L5 KYPHOPLASTY;  Surgeon: Hessie Knows, MD;               Location: [facility] ORS;  Service: Orthopedics;  Laterality:               N/A; 08/07/2019: ORIF HUMERUS FRACTURE; Left     Comment:  Procedure: OPEN REDUCTION INTERNAL FIXATION (ORIF) LEFT               DISTAL HUMERUS FRACTURE;  Surgeon: Altamese Alta, MD;  Location: [facility];  Service: Orthopedics;  Laterality:               Left; 06/02/2017: VENTRAL HERNIA REPAIR; N/A     Comment:  Procedure: exploratory laparotomy repair of incarcerated              VENTRAL hernia;  Surgeon: Florene Glen, MD;                Location: [facility] ORS;  Service: General;  Laterality: N/A; 08/29/2018: VENTRAL HERNIA REPAIR; N/A     Comment:  Procedure: LAPAROSCOPIC INCISIONAL HERNIA;  Surgeon:               Olean Ree, MD;  Location: [facility] ORS;  Service:               General;  Laterality: N/A;     Reproductive/Obstetrics negative OB ROS                             Anesthesia Physical Anesthesia Plan  ASA: 3  Anesthesia Plan: General ETT   Post-op Pain Management:    Induction: Intravenous  PONV Risk Score and Plan: Ondansetron, Dexamethasone, Midazolam and Treatment may vary  due to age or medical condition  Airway Management Planned: Oral ETT  Additional Equipment:   Intra-op Plan:   Post-operative Plan: Extubation in OR  Informed Consent: I have reviewed the patients History and Physical, chart, labs and discussed the procedure including the risks, benefits and alternatives for the proposed anesthesia with the patient or authorized representative who has indicated his/her understanding and acceptance.     Dental Advisory Given  Plan Discussed with: Anesthesiologist, CRNA and Surgeon  Anesthesia Plan Comments: (Patient and husband consented for risks of anesthesia including but not limited to:  - adverse reactions to medications - damage to eyes, teeth, lips or other oral mucosa - nerve damage due to positioning  - sore throat or hoarseness - Damage to heart, brain, nerves, lungs, other parts of body or loss of life  They voiced understanding.)       Anesthesia Quick Evaluation

## 2021-06-23 NOTE — ED Notes (Signed)
Pt placed on 2L nasal canula at this time.

## 2021-06-23 NOTE — H&P (Signed)
History and Physical    Pamela Ball [id number] DOB: 1945-11-28 DOA: 06/22/2021  PCP: Olin Hauser, DO   Patient coming from: home  I have personally briefly reviewed patient's old medical records in [place]  Chief Complaint: fall onto left knee  HPI: Pamela Ball is a 75 y.o. female with medical history significant for COPD, HTN, bipolar mood disorder, frequent falls, most recently 6/28 when she suffered a left acetabular and left humeral fracture, now ambulate with a cane who presents to the ED following a mechanical fall when she tripped over her cane.  She denies preceding lightheadedness, visual disturbance, one-sided weakness numbness or tingling or preceding palpitations, chest pain or shortness of breath.  Denies recent cough, fever or chills, vomiting abdominal pain or diarrhea.  ED course: On arrival, afebrile, BP 170/64, pulse 92 with respirations 20 and O2 sat 93% on room air Blood work unremarkable except for CK of 389  EKG, pending  Imaging: CT head and C-spine with no acute injury.  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 Rudene Christians who will take patient to the OR around 3 PM on 8/18.  Hospitalist consulted for admission.  Review of Systems: As per HPI otherwise all other systems on review of systems negative.    Past Medical History:  Diagnosis Date   Abdominal hernia with obstruction and without gangrene    Anxiety 02/15/2017   denies   Asthma    Bipolar 1 disorder, depressed, full remission ([place]) 02/15/2017   Bipolar affective disorder (HCC)    COPD (chronic obstructive pulmonary disease) (HCC)    Depression    GERD (gastroesophageal reflux disease) 02/15/2017   Glaucoma    Headache    Hiatal hernia     History of abdominal hernia 05/21/2017   History of compression fracture of spine 02/15/2017   Hyperlipidemia    Hypertension    Incisional hernia    Incisional ventral hernia w obstruction 06/02/2017   Normocytic anemia 05/23/2017   Osteoarthritis of knees, 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   Presbycusis of both ears 02/15/2017    Past Surgical History:  Procedure Laterality Date   ABDOMINAL HYSTERECTOMY  11/06/2006   APPENDECTOMY     BREAST BIOPSY Right 12/14/2020   stereo bx of distortion/asymmetry, coil marker, path pending   BREAST EXCISIONAL BIOPSY Left    age 20- benign    CESAREAN SECTION     x3   COLONOSCOPY WITH PROPOFOL N/A 06/25/2018   Procedure: COLONOSCOPY WITH PROPOFOL;  Surgeon: Jonathon Bellows, MD;  Location: [facility] ENDOSCOPY;  Service: Endoscopy;  Laterality: N/A;   KYPHOPLASTY     KYPHOPLASTY N/A 09/23/2018   Procedure: KYPHOPLASTY T10;  Surgeon: Hessie Knows, MD;  Location: [facility] ORS;  Service: Orthopedics;  Laterality: N/A;   KYPHOPLASTY N/A 03/11/2020   Procedure: L3 KYPHOPLASTY;  Surgeon: Hessie Knows, MD;  Location: [facility] ORS;  Service: Orthopedics;  Laterality: N/A;   KYPHOPLASTY N/A 12/30/2020   Procedure: L5 KYPHOPLASTY;  Surgeon: Hessie Knows, MD;  Location: [facility] ORS;  Service: Orthopedics;  Laterality: N/A;   ORIF HUMERUS FRACTURE Left 08/07/2019   Procedure: OPEN REDUCTION INTERNAL FIXATION (ORIF) LEFT DISTAL HUMERUS FRACTURE;  Surgeon: Altamese Bellaire, MD;  Location: [place];  Service: Orthopedics;  Laterality: Left;   VENTRAL  HERNIA REPAIR N/A 06/02/2017   Procedure: exploratory laparotomy repair of incarcerated  VENTRAL hernia;  Surgeon: Florene Glen, MD;  Location: [facility] ORS;  Service: General;  Laterality: N/A;   VENTRAL HERNIA REPAIR N/A 08/29/2018   Procedure: LAPAROSCOPIC INCISIONAL HERNIA;  Surgeon: Olean Ree, MD;  Location: [facility] ORS;  Service: General;  Laterality: N/A;     reports that she has never smoked. She has never used smokeless tobacco. She reports  that she does not drink alcohol and does not use drugs.  No Known Allergies  Family History  Problem Relation Age of Onset   Cancer Mother        cancer   Breast cancer Mother 39   Hypertension Father       Prior to Admission medications   Medication Sig Start Date End Date Taking? Authorizing Provider  albuterol (VENTOLIN HFA) 108 (90 Base) MCG/ACT inhaler INHALE 2 PUFFS INTO THE LUNGS EVERY 4 HOURS AS NEEDED FOR WHEEZING OR SHORTNESS OF BREATH (COUGH) Patient taking differently: Inhale 1-2 puffs into the lungs every 4 (four) hours as needed for shortness of breath or wheezing. 06/28/20  Yes Karamalegos, Devonne Doughty, DO  alendronate (FOSAMAX) 70 MG tablet Take by mouth. Take 1 tablet (70 mg total) by mouth every 7 (seven) days Take with a full glass of water. Do not lie down for the next 30 min. 02/27/21 02/27/22 Yes [provider]  amLODipine (NORVASC) 10 MG tablet TAKE 1 TABLET BY MOUTH EVERY DAY 06/16/21  Yes Karamalegos, Devonne Doughty, DO  buPROPion (WELLBUTRIN XL) 150 MG 24 hr tablet Take 150 mg by mouth daily. 01/11/15  Yes [provider]  busPIRone (BUSPAR) 10 MG tablet Take 10 mg by mouth daily. Prescribed by Psychiatry Dr Kasandra Knudsen 11/14/17  Yes Karamalegos, Devonne Doughty, DO  Calcium Citrate-Vitamin D (CALCIUM + D PO) Take 1 tablet by mouth daily.   Yes [provider]  Cholecalciferol (VITAMIN D3) 50 MCG (2000 UT) TABS Take 2,000 Units by mouth daily.   Yes [provider]  Cyanocobalamin (VITAMIN B-12) 3000 MCG SUBL Take 3,000 mcg by mouth daily.   Yes [provider]  Ferrous Fumarate (IRON) 18 MG TBCR Take 18 mg by mouth daily. W/Rice Protein   Yes [provider]  ibuprofen (ADVIL) 600 MG tablet TAKE 1 TABLET BY MOUTH EVERY 8 HOURS AS NEEDED. 11/21/20  Yes Karamalegos, Devonne Doughty, DO  Multiple Vitamin (MULTIVITAMIN WITH MINERALS) TABS tablet Take 1 tablet by mouth daily.   Yes [provider]  Multiple Vitamins-Minerals  (PRESERVISION AREDS) TABS Take 1 tablet by mouth daily.   Yes [provider]  OLANZapine (ZYPREXA) 7.5 MG tablet Take 7.5 mg by mouth daily. 12/24/19  Yes [provider]  omeprazole (PRILOSEC) 20 MG capsule TAKE 1 CAPSULE (20 MG TOTAL) BY MOUTH DAILY BEFORE BREAKFAST. 12/16/20  Yes Karamalegos, Alexander J, DO  ondansetron (ZOFRAN-ODT) 4 MG disintegrating tablet TAKE 1 TABLET BY MOUTH EVERY 8 HOURS AS NEEDED FOR NAUSEA AND VOMITING 05/20/21  Yes Karamalegos, Devonne Doughty, DO  oxyCODONE (OXY IR/ROXICODONE) 5 MG immediate release tablet Take 1 tablet (5 mg total) by mouth every 6 (six) hours as needed. 06/16/21  Yes Karamalegos, Devonne Doughty, DO  Probiotic Product (ACIDOPHILUS) CHEW Chew by mouth. 05/10/21  Yes [provider]  simvastatin (ZOCOR) 20 MG tablet TAKE 1 TABLET BY MOUTH EVERY DAY IN THE MORNING Patient taking differently: Take 20 mg by mouth daily. 12/16/20  Yes Karamalegos, Devonne Doughty, DO  venlafaxine XR (  EFFEXOR-XR) 75 MG 24 hr capsule Take 75 mg by mouth daily with breakfast.  01/11/15  Yes [provider]  vitamin C (ASCORBIC ACID) 500 MG tablet Take 500 mg by mouth daily.   Yes [provider]    Physical Exam: Vitals:   06/22/21 2310 06/22/21 2330  BP: (!) 170/64 (!) 151/65  Pulse: 92 97  Resp: 20   Temp: 98.6 F (37 C)   TempSrc: Oral   SpO2: 93% 94%     Vitals:   06/22/21 2310 06/22/21 2330  BP: (!) 170/64 (!) 151/65  Pulse: 92 97  Resp: 20   Temp: 98.6 F (37 C)   TempSrc: Oral   SpO2: 93% 94%      Constitutional: Alert and oriented x 3 . Not in any apparent distress HEENT:      Head: Normocephalic and atraumatic.         Eyes: PERLA, EOMI, Conjunctivae are normal. Sclera is non-icteric.       Mouth/Throat: Mucous membranes are moist.       Neck: Supple with no signs of meningismus. Cardiovascular: Regular rate and rhythm. No murmurs, gallops, or rubs. 2+ symmetrical distal pulses are present . No JVD. No LE  edema Respiratory: Respiratory effort normal .Lungs sounds clear bilaterally. No wheezes, crackles, or rhonchi.  Gastrointestinal: Soft, non tender, and non distended with positive bowel sounds.  Genitourinary: No CVA tenderness. Musculoskeletal: Swelling and tenderness left knee. No cyanosis, or erythema of extremities. Neurologic:  Face is symmetric. Moving all extremities. No gross focal neurologic deficits . Skin: Skin is warm, dry.  No rash or ulcers Psychiatric: Mood and affect are normal    Labs on Admission: I have personally reviewed following labs and imaging studies  CBC: Recent Labs  Lab 06/22/21 2317  WBC 4.4  NEUTROABS 2.1  HGB 13.9  HCT 41.8  MCV 99.3  PLT 99991111   Basic Metabolic Panel: Recent Labs  Lab 06/22/21 2317  NA 141  K 3.5  CL 102  CO2 26  GLUCOSE 86  BUN 10  CREATININE 0.57  CALCIUM 8.8*   GFR: CrCl cannot be calculated (Unknown ideal weight.). Liver Function Tests: No results for input(s): AST, ALT, ALKPHOS, BILITOT, PROT, ALBUMIN in the last 168 hours. No results for input(s): LIPASE, AMYLASE in the last 168 hours. No results for input(s): AMMONIA in the last 168 hours. Coagulation Profile: No results for input(s): INR, PROTIME in the last 168 hours. Cardiac Enzymes: Recent Labs  Lab 06/22/21 2317  CKTOTAL 389*   BNP (last 3 results) No results for input(s): PROBNP in the last 8760 hours. HbA1C: No results for input(s): HGBA1C in the last 72 hours. CBG: No results for input(s): GLUCAP in the last 168 hours. Lipid Profile: No results for input(s): CHOL, HDL, LDLCALC, TRIG, CHOLHDL, LDLDIRECT in the last 72 hours. Thyroid Function Tests: No results for input(s): TSH, T4TOTAL, FREET4, T3FREE, THYROIDAB in the last 72 hours. Anemia Panel: No results for input(s): VITAMINB12, FOLATE, FERRITIN, TIBC, IRON, RETICCTPCT in the last 72 hours. Urine analysis:    Component Value Date/Time   COLORURINE YELLOW (A) 05/29/2020 2203    APPEARANCEUR HAZY (A) 05/29/2020 2203   APPEARANCEUR Clear 02/08/2015 1659   LABSPEC 1.018 05/29/2020 2203   LABSPEC 1.010 02/08/2015 1659   PHURINE 5.0 05/29/2020 2203   GLUCOSEU NEGATIVE 05/29/2020 2203   GLUCOSEU Negative 02/08/2015 1659   HGBUR NEGATIVE 05/29/2020 2203   BILIRUBINUR Negative 02/09/2021 1139   BILIRUBINUR Negative 02/08/2015 1659  KETONESUR 20 (A) 05/29/2020 2203   PROTEINUR Negative 02/09/2021 1139   PROTEINUR NEGATIVE 05/29/2020 2203   UROBILINOGEN 0.2 02/09/2021 1139   NITRITE Positive 02/09/2021 1139   NITRITE NEGATIVE 05/29/2020 2203   LEUKOCYTESUR Small (1+) (A) 02/09/2021 1139   LEUKOCYTESUR TRACE (A) 05/29/2020 2203   LEUKOCYTESUR Negative 02/08/2015 1659    Radiological Exams on Admission: DG Tibia/Fibula Left  Result Date: 06/23/2021 CLINICAL DATA:  Fall and left lower extremity pain. EXAM: DG HIP (WITH OR WITHOUT PELVIS) 2-3V LEFT; LEFT KNEE - COMPLETE 4+ VIEW; LEFT TIBIA AND FIBULA - 2 VIEW COMPARISON:  None. FINDINGS: There is a comminuted, displaced, and mildly impacted fracture of the distal femur. There is dorsal angulation of the distal fracture fragment. There are displaced fractures of the left superior and inferior pubic rami, age indeterminate, likely acute. There is no dislocation. The bones are osteopenic. Lower lumbar vertebroplasty. Ventral hernia repair mesh. IMPRESSION: 1. Comminuted, displaced and angulated fracture of the distal femur. 2. Displaced fractures of the left superior and inferior pubic rami, likely acute. Electronically Signed   By: Anner Crete M.D.   On: 06/23/2021 00:19   CT HEAD WO CONTRAST (5MM)  Result Date: 06/22/2021 CLINICAL DATA:  Recent fall with headaches and neck pain, initial encounter EXAM: CT HEAD WITHOUT CONTRAST CT CERVICAL SPINE WITHOUT CONTRAST TECHNIQUE: Multidetector CT imaging of the head and cervical spine was performed following the standard protocol without intravenous contrast. Multiplanar CT  image reconstructions of the cervical spine were also generated. COMPARISON:  05/29/2020 FINDINGS: CT HEAD FINDINGS Brain: Mild atrophic changes are noted. 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 No acute hemorrhage or acute infarct is noted. Vascular: No hyperdense vessel or unexpected calcification. Skull: Normal. Negative for fracture or focal lesion. Sinuses/Orbits: Mucosal retention cyst is noted within sphenoid sinus Other: None CT CERVICAL SPINE FINDINGS Alignment: Within normal limits. Skull base and vertebrae: 7 cervical segments are well visualized. Vertebral body height is well maintained. Multi level disc space narrowing from C4-C7 is noted with osteophytic change. Facet hypertrophic changes are noted as well. No acute fracture or acute facet abnormality is noted. Soft tissues and spinal canal: Surrounding soft tissue structures are within normal limits. Upper chest: Visualized lung apices are within normal limits. Other: None IMPRESSION: CT of the head: Mild atrophic changes are noted. Lacunar infarct in the right basal ganglia unchanged from the prior exam CT of the cervical spine: Multilevel degenerative change without acute abnormality. Electronically Signed   By: Inez Catalina M.D.   On: 06/22/2021 23:58   CT Cervical Spine Wo Contrast  Result Date: 06/22/2021 CLINICAL DATA:  Recent fall with headaches and neck pain, initial encounter EXAM: CT HEAD WITHOUT CONTRAST CT CERVICAL SPINE WITHOUT CONTRAST TECHNIQUE: Multidetector CT imaging of the head and cervical spine was performed following the standard protocol without intravenous contrast. Multiplanar CT image reconstructions of the cervical spine were also generated. COMPARISON:  05/29/2020 FINDINGS: CT HEAD FINDINGS Brain: Mild atrophic changes are noted. 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 No acute hemorrhage or acute infarct is noted. Vascular: No hyperdense vessel or unexpected  calcification. Skull: Normal. Negative for fracture or focal lesion. Sinuses/Orbits: Mucosal retention cyst is noted within sphenoid sinus Other: None CT CERVICAL SPINE FINDINGS Alignment: Within normal limits. Skull base and vertebrae: 7 cervical segments are well visualized. Vertebral body height is well maintained. Multi level disc space narrowing from C4-C7 is noted with osteophytic change. Facet  hypertrophic changes are noted as well. No acute fracture or acute facet abnormality is noted. Soft tissues and spinal canal: Surrounding soft tissue structures are within normal limits. Upper chest: Visualized lung apices are within normal limits. Other: None IMPRESSION: CT of the head: Mild atrophic changes are noted. Lacunar infarct in the right basal ganglia unchanged from the prior exam CT of the cervical spine: Multilevel degenerative change without acute abnormality. Electronically Signed   By: Inez Catalina M.D.   On: 06/22/2021 23:58   DG Chest Portable 1 View  Result Date: 06/23/2021 CLINICAL DATA:  History of recent fall with known femoral fractures,, initial encounter EXAM: PORTABLE CHEST 1 VIEW COMPARISON:  12/26/2018 FINDINGS: Cardiac shadow is mildly prominent. Aortic calcifications are noted. The lungs are well aerated bilaterally. No focal infiltrate or effusion is seen. Chronic deformity of the proximal humeri are seen. IMPRESSION: Chronic changes without acute abnormality. Electronically Signed   By: Inez Catalina M.D.   On: 06/23/2021 00:16   DG Knee Complete 4 Views Left  Result Date: 06/23/2021 CLINICAL DATA:  Fall and left lower extremity pain. EXAM: DG HIP (WITH OR WITHOUT PELVIS) 2-3V LEFT; LEFT KNEE - COMPLETE 4+ VIEW; LEFT TIBIA AND FIBULA - 2 VIEW COMPARISON:  None. FINDINGS: There is a comminuted, displaced, and mildly impacted fracture of the distal femur. There is dorsal angulation of the distal fracture fragment. There are displaced fractures of the left superior and inferior pubic  rami, age indeterminate, likely acute. There is no dislocation. The bones are osteopenic. Lower lumbar vertebroplasty. Ventral hernia repair mesh. IMPRESSION: 1. Comminuted, displaced and angulated fracture of the distal femur. 2. Displaced fractures of the left superior and inferior pubic rami, likely acute. Electronically Signed   By: Anner Crete M.D.   On: 06/23/2021 00:19   DG Hip Unilat W or Wo Pelvis 2-3 Views Left  Result Date: 06/23/2021 CLINICAL DATA:  Fall and left lower extremity pain. EXAM: DG HIP (WITH OR WITHOUT PELVIS) 2-3V LEFT; LEFT KNEE - COMPLETE 4+ VIEW; LEFT TIBIA AND FIBULA - 2 VIEW COMPARISON:  None. FINDINGS: There is a comminuted, displaced, and mildly impacted fracture of the distal femur. There is dorsal angulation of the distal fracture fragment. There are displaced fractures of the left superior and inferior pubic rami, age indeterminate, likely acute. There is no dislocation. The bones are osteopenic. Lower lumbar vertebroplasty. Ventral hernia repair mesh. IMPRESSION: 1. Comminuted, displaced and angulated fracture of the distal femur. 2. Displaced fractures of the left superior and inferior pubic rami, likely acute. Electronically Signed   By: Anner Crete M.D.   On: 06/23/2021 00:19     Assessment/Plan 75 year old female with history of COPD, HTN, bipolar mood disorder, frequent falls, most recently 6/28 when she suffered a left acetabular and left humeral fracture, now ambulate with a cane who presents to the ED following a mechanical fall when she tripped over her cane.     Closed comminuted intra-articular fracture of distal femur, left, initial encounter ([place])   Closed fracture of multiple pubic rami, left, initial encounter    Accidental fall/recurrent falls -Pain control - N.p.o. - Orthopedic consult for further recommendations    Preoperative clearance - Pending unremarkable EKG, patient at low to moderate risk for perioperative cardiopulmonary  complications and can proceed with proposed orthopedic procedure    Anxiety/bipolar mood disorder - Patient on multiple psychotropics including Wellbutrin, venlafaxine, BuSpar    COPD (chronic obstructive pulmonary disease) (HCC) - Stable, not exacerbated - Albuterol as  needed    Essential hypertension - Continue amlodipine    DVT prophylaxis: SCDs Code Status: full code  Family Communication:  none  Disposition Plan: Back to previous home environment Consults called: Orthopedics Status:At the time of admission, it appears that the appropriate admission status for this patient is INPATIENT. This is judged to be reasonable and necessary in order to provide the required intensity of service to ensure the patient's safety given the presenting symptoms, physical exam findings, and initial radiographic and laboratory data in the context of their  Comorbid conditions.   Patient requires inpatient status due to high intensity of service, high risk for further deterioration and high frequency of surveillance required.   I certify that at the point of admission it is my clinical judgment that the patient will require inpatient hospital care spanning beyond [place] MD [facility]     06/23/2021, 3:15 AM

## 2021-06-23 NOTE — Transfer of Care (Signed)
Immediate Anesthesia Transfer of Care Note  Patient: Pamela Ball  Procedure(s) Performed: OPEN REDUCTION INTERNAL FIXATION (ORIF) DISTAL FEMUR FRACTURE (Left)  Patient Location: PACU  Anesthesia Type:General  Level of Consciousness: awake, drowsy and patient cooperative  Airway & Oxygen Therapy: Patient Spontanous Breathing and Patient connected to face mask oxygen  Post-op Assessment: Report given to RN and Post -op Vital signs reviewed and stable  Post vital signs: Reviewed and stable  Last Vitals:  Vitals Value Taken Time  BP 154/73 06/23/21 1615  Temp 37.3 C 06/23/21 1613  Pulse 78 06/23/21 1628  Resp 17 06/23/21 1628  SpO2 98 % 06/23/21 1628  Vitals shown include unvalidated device data.  Last Pain:  Vitals:   06/23/21 1613  TempSrc:   PainSc: 0-No pain         Complications: No notable events documented.

## 2021-06-23 NOTE — ED Notes (Signed)
Purewic applied to pt at this time. Call bell within reach, fall band placed and fall socks placed on pt.

## 2021-06-23 NOTE — Progress Notes (Signed)
Patient seen and examined this AM.  H&P reviewed.  Husband at bedside.  This is a no charge note as patient was admitted this AM.     Pamela Ball is a 75 y.o. female with medical history significant for COPD, HTN, bipolar mood disorder, frequent falls, most recently 6/28 when she suffered a left acetabular and left humeral fracture, now ambulate with a cane who presents to the ED following a mechanical fall when she tripped over her cane. Patient reports pain control currently.  Cta no w/r/r Regular s1/s2  Left leg elevated on pillow, mild sw3elling  A/P: plan for OR today Ortho consulted

## 2021-06-23 NOTE — Anesthesia Procedure Notes (Signed)
Procedure Name: Intubation Date/Time: 06/23/2021 2:28 PM Performed by: Lowry Bowl, CRNA Pre-anesthesia Checklist: Patient identified, Emergency Drugs available, Suction available and Patient being monitored Patient Re-evaluated:Patient Re-evaluated prior to induction Oxygen Delivery Method: Circle system utilized Preoxygenation: Pre-oxygenation with 100% oxygen Induction Type: IV induction Ventilation: Mask ventilation without difficulty Laryngoscope Size: 3 and McGraph Grade View: Grade I Tube type: Oral Tube size: 6.5 mm Number of attempts: 1 Airway Equipment and Method: Stylet and Video-laryngoscopy Placement Confirmation: ETT inserted through vocal cords under direct vision, positive ETCO2 and breath sounds checked- equal and bilateral Secured at: 20 cm Tube secured with: Tape Dental Injury: Teeth and Oropharynx as per pre-operative assessment

## 2021-06-23 NOTE — Anesthesia Postprocedure Evaluation (Signed)
Anesthesia Post Note  Patient: Pamela Ball  Procedure(s) Performed: OPEN REDUCTION INTERNAL FIXATION (ORIF) DISTAL FEMUR FRACTURE (Left)  Patient location during evaluation: PACU Anesthesia Type: General Level of consciousness: awake and alert Pain management: pain level controlled Vital Signs Assessment: post-procedure vital signs reviewed and stable Respiratory status: spontaneous breathing, nonlabored ventilation, respiratory function stable and patient connected to nasal cannula oxygen Cardiovascular status: blood pressure returned to baseline and stable Postop Assessment: no apparent nausea or vomiting Anesthetic complications: no   No notable events documented.   Last Vitals:  Vitals:   06/23/21 1727 06/23/21 1748  BP: 115/65 115/65  Pulse: 71 74  Resp: 15 15  Temp:  36.4 C  SpO2: 93%     Last Pain:  Vitals:   06/23/21 1748  TempSrc: Oral  PainSc:                  Arita Miss

## 2021-06-24 ENCOUNTER — Telehealth: Payer: Self-pay | Admitting: Family Medicine

## 2021-06-24 ENCOUNTER — Encounter: Payer: Self-pay | Admitting: Orthopedic Surgery

## 2021-06-24 LAB — BASIC METABOLIC PANEL
Anion gap: 8 (ref 5–15)
BUN: 11 mg/dL (ref 8–23)
CO2: 27 mmol/L (ref 22–32)
Calcium: 7.5 mg/dL — ABNORMAL LOW (ref 8.9–10.3)
Chloride: 103 mmol/L (ref 98–111)
Creatinine, Ser: 0.55 mg/dL (ref 0.44–1.00)
GFR, Estimated: >60 mL/min (ref >60)
Glucose, Bld: 177 mg/dL — ABNORMAL HIGH (ref 70–99)
Potassium: 3.2 mmol/L — ABNORMAL LOW (ref 3.5–5.1)
Sodium: 138 mmol/L (ref 135–145)

## 2021-06-24 LAB — CBC
HCT: 36.9 % (ref 36.0–46.0)
Hemoglobin: 12 g/dL (ref 12.0–15.0)
MCH: 33.1 pg (ref 26.0–34.0)
MCHC: 32.5 g/dL (ref 30.0–36.0)
MCV: 101.9 fL — ABNORMAL HIGH (ref 80.0–100.0)
Platelets: 217 10*3/uL (ref 150–400)
RBC: 3.62 MIL/uL — ABNORMAL LOW (ref 3.87–5.11)
RDW: 12.9 % (ref 11.5–15.5)
WBC: 4.6 10*3/uL (ref 4.0–10.5)
nRBC: 0 % (ref 0.0–0.2)

## 2021-06-24 MED ORDER — POTASSIUM CHLORIDE CRYS ER 20 MEQ PO TBCR
40.0000 meq | EXTENDED_RELEASE_TABLET | Freq: Once | ORAL | Status: AC
  Administered 2021-06-24: 40 meq via ORAL
  Filled 2021-06-24: qty 2

## 2021-06-24 MED ORDER — DOCUSATE SODIUM 100 MG PO CAPS: 100.0000 mg | ORAL_CAPSULE | Freq: Two times a day (BID) | ORAL | 0 refills | Status: AC

## 2021-06-24 MED ORDER — ENSURE ENLIVE PO LIQD
237.0000 mL | Freq: Three times a day (TID) | ORAL | Status: DC
  Administered 2021-06-24 – 2021-06-27 (×4): 237 mL via ORAL

## 2021-06-24 MED ORDER — ENOXAPARIN SODIUM 40 MG/0.4ML IJ SOSY: 40.0000 mg | PREFILLED_SYRINGE | INTRAMUSCULAR | 0 refills | Status: DC

## 2021-06-24 MED ORDER — OXYCODONE HCL 5 MG PO TABS
5.0000 mg | ORAL_TABLET | ORAL | Status: DC | PRN
  Administered 2021-06-24 – 2021-06-27 (×12): 5 mg via ORAL
  Filled 2021-06-24 (×12): qty 1

## 2021-06-24 MED ORDER — OXYCODONE HCL 5 MG PO TABS: 5.0000 mg | ORAL_TABLET | ORAL | 0 refills | Status: DC | PRN

## 2021-06-24 MED ORDER — POTASSIUM CHLORIDE CRYS ER 10 MEQ PO TBCR
10.0000 meq | EXTENDED_RELEASE_TABLET | Freq: Every day | ORAL | Status: DC
  Administered 2021-06-24 – 2021-06-27 (×4): 10 meq via ORAL
  Filled 2021-06-24 (×4): qty 1

## 2021-06-24 NOTE — Evaluation (Signed)
Physical Therapy Evaluation Patient Details Name: Pamela Ball MRN: [id number] DOB: 1946/09/27 Today's Date: 06/24/2021   History of Present Illness  Pt is a 75 y.o. female s/p mechanical fall (tripped on cane).  Per chart pt also had a fall May 03, 2021 with L acetabular fx and L humeral fx (L UE still NWB'ing); non-operative.  Imaging (from most recent fall) showing comminuted displaced and angulated fx of distal femur and displaced fx's of L superior and inferior pubic rami (likely acute).  S/p ORIF L distal femur fx 8/18 (L LE PWB'ing 20%).  PMH includes abdominal hernia, anxiety, bipolar affective disorder, COPD, htn, kyphoplasty, ORIF L distal humerus fx 08/07/2019, ventral hernia repair, and frequent falls.  Clinical Impression  PT/OT co-evaluation performed.  Prior to hospital admission, pt was most recently ambulatory with quad cane (pt recently discharged home after rehab stay for injuries/fx's from fall in June); lives with her husband in 1 level home with ramp to enter.  Currently pt is mod assist x2 with bed mobility; SBA with sitting balance; and max assist x2 lateral scooting to L along bed.  Pt's mobility limited/complicated d/t NWB'ing L UE and PWB'ing (20%) L LE.  Pt would benefit from skilled PT to address noted impairments and functional limitations (see below for any additional details).  Upon hospital discharge, pt would benefit from SNF.    Follow Up Recommendations SNF    Equipment Recommendations  Wheelchair (measurements PT);Wheelchair cushion (measurements PT);3in1 (PT) (hoyer lift)    Recommendations for Other Services OT consult     Precautions / Restrictions Precautions Precautions: Fall Precaution Comments: Per MD Rudene Christians 8/19 regarding L LE: no limit on L knee ROM except pain; no brace Required Braces or Orthoses: Other Brace Other Brace: Pt has shoulder sling for L UE that she has been wearing when mobilizing (per OP ortho note 06/09/21 pt does not need to wear  her sling)--spouse to bring into hospital Restrictions Weight Bearing Restrictions: Yes LUE Weight Bearing: Non weight bearing LLE Weight Bearing: Partial weight bearing LLE Partial Weight Bearing Percentage or Pounds: 20% Other Position/Activity Restrictions: Per OP ortho note 06/09/21 regarding L UE (non-operative):"remain nonweightbearing to left upper extremity. She does not need to wear her sling at this time. Increased passive range of motion to left upper extremity and begin active assisted motion as tolerated to the left arm".      Mobility  Bed Mobility Overal bed mobility: Needs Assistance Bed Mobility: Supine to Sit;Sit to Supine     Supine to sit: Mod assist;+2 for physical assistance;HOB elevated Sit to supine: Mod assist;+2 for physical assistance   General bed mobility comments: assist for trunk and L>R LE; vc's for technique and L UE safety/NWB'ing    Transfers Overall transfer level: Needs assistance Equipment used: None Transfers: Lateral/Scoot Transfers          Lateral/Scoot Transfers: Max assist;+2 physical assistance General transfer comment: L LE kept [place] with assist; pt pushing through R UE and R LE; L UE positioned to keept NWB'ing; vc's for technique and positioning  Ambulation/Gait             General Gait Details: not appropriate at this time  Stairs            Wheelchair Mobility    Modified Rankin (Stroke Patients Only)       Balance Overall balance assessment: Needs assistance Sitting-balance support: No upper extremity supported;Feet unsupported Sitting balance-Leahy Scale: Fair Sitting balance - Comments: steady static sitting  Standing balance comment: not appropriate for standing at this time                             Pertinent Vitals/Pain Pain Assessment: Faces Faces Pain Scale: Hurts a little bit Pain Location: LLE incision with movement Pain Descriptors / Indicators:  Grimacing;Guarding Pain Intervention(s): Limited activity within patient's tolerance;Monitored during session;Repositioned;Premedicated before session;Patient requesting pain meds-RN notified;RN gave pain meds during session Vitals (HR and O2 on 2 L O2 via nasal cannula) stable and WFL throughout treatment session.    Home Living Family/patient expects to be discharged to:: Private residence Living Arrangements: Spouse/significant other Available Help at Discharge: Family Type of Home: House Home Access: [place]: One [place]: Bedside commode;Cane - quad;Wheelchair - manual      Prior Function Level of Independence: Needs assistance   Gait / Transfers Assistance Needed: Pt recently ambulating with quad cane (L UE still NWB'ing from recent non-op fx)  ADL's / Homemaking Assistance Needed: Pt/spouse report pt required assist for ADL, med mgt, all transportation        Hand Dominance   Dominant Hand: Left    Extremity/Trunk Assessment   Upper Extremity Assessment Upper Extremity Assessment: Defer to OT evaluation LUE Deficits / Details: limited assessment (LUE NWBing)    Lower Extremity Assessment Lower Extremity Assessment: Generalized weakness;LLE deficits/detail (R LE WFL) LLE Deficits / Details: at least 3/5 AROM DF/PF; fair L quad set strength; at least 2+/5 L hip flexion; limited L knee AAROM d/t L knee pain LLE: Unable to fully assess due to pain    Cervical / Trunk Assessment Cervical / Trunk Assessment: Normal  Communication   Communication: HOH  Cognition Arousal/Alertness: Awake/alert Behavior During Therapy: WFL for tasks assessed/performed                                   General Comments: Alert and oriented to person and place but not time.      General Comments  Pt agreeable to therapy session.    Exercises Total Joint Exercises Heel Slides: AAROM;Strengthening;Left;10 reps;Supine (minimal L knee  flexion ROM per pt comfort)   Assessment/Plan    PT Assessment Patient needs continued PT services  PT Problem List Decreased strength;Decreased range of motion;Decreased activity tolerance;Decreased balance;Decreased mobility;Decreased knowledge of use of DME;Decreased knowledge of precautions;Pain;Decreased skin integrity       PT Treatment Interventions DME instruction;Functional mobility training;Therapeutic activities;Therapeutic exercise;Balance training;Patient/family education;Wheelchair mobility training    PT Goals (Current goals can be found in the Care Plan section)  Acute Rehab PT Goals Patient Stated Goal: to improve mobility PT Goal Formulation: With patient/family Time For Goal Achievement: 07/08/21 Potential to Achieve Goals: Fair    Frequency 7X/week   Barriers to discharge Decreased caregiver support      Co-evaluation PT/OT/SLP Co-Evaluation/Treatment: Yes Reason for Co-Treatment: Complexity of the patient's impairments (multi-system involvement);For patient/therapist safety;To address functional/ADL transfers PT goals addressed during session: Mobility/safety with mobility;Balance OT goals addressed during session: ADL's and self-care       AM-PAC PT "6 Clicks" Mobility  Outcome Measure Help needed turning from your back to your side while in a flat bed without using bedrails?: A Lot Help needed moving from lying on your back to sitting on the side of a flat bed without using bedrails?: Total Help needed moving to  and from a bed to a chair (including a wheelchair)?: Total Help needed standing up from a chair using your arms (e.g., wheelchair or bedside chair)?: Total Help needed to walk in hospital room?: Total Help needed climbing 3-5 steps with a railing? : Total 6 Click Score: 7    End of Session Equipment Utilized During Treatment: Gait belt Activity Tolerance: Patient tolerated treatment well Patient left: in bed;with call bell/phone within  reach;with bed alarm set;Other (comment) (B heels floating via pillow support) Nurse Communication: Mobility status;Precautions;Weight bearing status PT Visit Diagnosis: Other abnormalities of gait and mobility (R26.89);Muscle weakness (generalized) (M62.81);Repeated falls (R29.6);Pain Pain - Right/Left: Left Pain - part of body: Knee    Time: [id number] PT Time Calculation (min) (ACUTE ONLY): 40 min   Charges:   PT Evaluation $PT Eval Low Complexity: 1 Low PT Treatments $Therapeutic Activity: 8-22 mins       Leitha Bleak, PT 06/24/21, 1:56 PM

## 2021-06-24 NOTE — Plan of Care (Signed)

## 2021-06-24 NOTE — Evaluation (Signed)
Occupational Therapy Evaluation Patient Details Name: Pamela Ball MRN: [id number] DOB: 1946/05/29 Today's Date: 06/24/2021    History of Present Illness 75 y.o. female with medical history significant for COPD, HTN, bipolar mood disorder, frequent falls, most recently 6/28 when she suffered a left acetabular and left humeral fracture, now ambulates with a cane who presents to the ED following a mechanical fall when she tripped over her cane. Now s/p L distal femur fracture ORIF, LLE PWBing (20%). LUE still NWBing.   Clinical Impression   Pt was seen for OT evaluation and co-tx with PT this date. Prior to hospital admission, pt was home, ambulating with QC, and receiving assist from her spouse for ADL, transportation, medications, and other IADL 2/2 hx low vision and recent L shoulder fracture (being treated non-operatively but which remains NWBing). Pt with 3 falls/46mo Pt alert, oriented x3, and follows commands with occasional cues. Spouse present and supportive. Spouse recently built a ramped entrance for their 1 story home.  Currently pt demonstrates significant impairments as described below (See OT problem list) which functionally limit her ability to perform ADL/self-care tasks. Pt currently requires MOD A +2 for bed mobility, MAX A +2 for attempts at lateral scoots using RLE and RUE only, and MAX A for LB ADL at bed level since she is unable to clear her buttocks from the bed. Pt would benefit from skilled OT services to address noted impairments and functional limitations (see below for any additional details) in order to maximize safety and independence while minimizing falls risk and caregiver burden. Upon hospital discharge, recommend STR to maximize pt safety and return to PLOF.      Follow Up Recommendations  SNF    Equipment Recommendations  Other (comment) (defer to next venue)    Recommendations for Other Services       Precautions / Restrictions  Precautions Precautions: Fall Required Braces or Orthoses:  (has shoulder sling that she has been wearing when mobilizing; spouse will bring to hospital) Restrictions Weight Bearing Restrictions: Yes LUE Weight Bearing: Non weight bearing LLE Weight Bearing: Partial weight bearing LLE Partial Weight Bearing Percentage or Pounds: 20% Other Position/Activity Restrictions: Per Poggi note 8/4 regarding LUE: "Begin passive range of motion to the left shoulder and increasing to active assisted as tolerated. Still nonweightbearing to left upper extremity"      Mobility Bed Mobility Overal bed mobility: Needs Assistance Bed Mobility: Supine to Sit;Sit to Supine     Supine to sit: Mod assist;+2 for physical assistance Sit to supine: Mod assist;+2 for physical assistance   General bed mobility comments: cues for RUE positioning, LUE safety/NWBing    Transfers Overall transfer level: Needs assistance Equipment used: None Transfers: Lateral/Scoot Transfers          Lateral/Scoot Transfers: Max assist;+2 physical assistance General transfer comment: VC LLE PWB, push through RLE, LUE protection/positioning to support NWBing    Balance Overall balance assessment: Needs assistance Sitting-balance support: No upper extremity supported;Feet unsupported Sitting balance-Leahy Scale: Fair Sitting balance - Comments: no LOB with static sitting       Standing balance comment: unable to achieve standing                           ADL either performed or assessed with clinical judgement   ADL Overall ADL's : Needs assistance/impaired  General ADL Comments: Pt currently requires MAX A for LB ADL, MOD A for UB ADL, set up and MIN A-MOD grooming     Vision Baseline Vision/History: Macular Degeneration;Legally blind Vision Assessment?: No apparent visual deficits Additional Comments: per pt/spouse pt essentially blind      Perception     Praxis      Pertinent Vitals/Pain Pain Assessment: Faces Faces Pain Scale: Hurts a little bit Pain Location: LLE incision with movement Pain Descriptors / Indicators: Grimacing;Guarding Pain Intervention(s): Limited activity within patient's tolerance;Monitored during session;Repositioned;Premedicated before session;Patient requesting pain meds-RN notified;RN gave pain meds during session     Hand Dominance Left   Extremity/Trunk Assessment Upper Extremity Assessment Upper Extremity Assessment: Generalized weakness;LUE deficits/detail (RUE grossly 4-/5) LUE Deficits / Details: limited assessment, LUE NWBing   Lower Extremity Assessment Lower Extremity Assessment: Generalized weakness;LLE deficits/detail;Defer to PT evaluation LLE: Unable to fully assess due to pain       Communication Communication Communication: HOH   Cognition Arousal/Alertness: Awake/alert Behavior During Therapy: WFL for tasks assessed/performed                                   General Comments: Pt alert and disoriented to date, follows command with PRN VC, difficulty with concept of incentive spirometer requiring MAX cues for sequencing   General Comments       Exercises Other Exercises Other Exercises: Pt/spouse instructed in LUE precautions, encouraged spouse to bring sling for ADL/mobility efforts, LLE precautions, bed mobility, transfer training Other Exercises: Pt instructed in incentive spirometer requiring max VC for sequencing/correct use (limited by vision)   Shoulder Instructions      Home Living Family/patient expects to be discharged to:: Private residence Living Arrangements: Spouse/significant other Available Help at Discharge: Family Type of Home: House Home Access: Ramped entrance     Home Layout: One level         Biochemist, clinical: Standard     Home Equipment: Bedside commode;Cane - quad;Wheelchair - manual          Prior  Functioning/Environment Level of Independence: Needs assistance  Gait / Transfers Assistance Needed: Pt/spouse report pt was recently ambulating with QC (LUE still NWBing from recent non-op fx) ADL's / Homemaking Assistance Needed: Pt/spouse report pt required assist for ADL, med mgt, all transportation            OT Problem List: Pain;Decreased strength;Impaired balance (sitting and/or standing);Decreased knowledge of use of DME or AE;Impaired UE functional use;Decreased knowledge of precautions      OT Treatment/Interventions: Self-care/ADL training;Therapeutic exercise;Therapeutic activities;Energy conservation;DME and/or AE instruction;Cognitive remediation/compensation;Patient/family education;Balance training;Manual therapy;Visual/perceptual remediation/compensation    OT Goals(Current goals can be found in the care plan section) Acute Rehab OT Goals Patient Stated Goal: get better OT Goal Formulation: With patient/family Time For Goal Achievement: 07/08/21 Potential to Achieve Goals: Good ADL Goals Pt Will Perform Grooming: sitting;with min assist Pt Will Perform Upper Body Dressing: sitting;with min assist Pt Will Transfer to Toilet: squat pivot transfer;bedside commode;with mod assist;with +2 assist  OT Frequency: Min 2X/week   Barriers to D/C:            Co-evaluation PT/OT/SLP Co-Evaluation/Treatment: Yes Reason for Co-Treatment: Complexity of the patient's impairments (multi-system involvement);For patient/therapist safety;To address functional/ADL transfers PT goals addressed during session: Mobility/safety with mobility;Balance OT goals addressed during session: ADL's and self-care      AM-PAC OT "6 Clicks" Daily Activity  Outcome Measure Help from another person eating meals?: A Little Help from another person taking care of personal grooming?: A Lot Help from another person toileting, which includes using toliet, bedpan, or urinal?: Total Help from another  person bathing (including washing, rinsing, drying)?: A Lot Help from another person to put on and taking off regular upper body clothing?: A Lot Help from another person to put on and taking off regular lower body clothing?: A Lot 6 Click Score: 12   End of Session Nurse Communication: Patient requests pain meds;Other (comment) (Financial controller notified of need for push button call bell)  Activity Tolerance: Patient tolerated treatment well Patient left: in bed;with call bell/phone within reach;with bed alarm set  OT Visit Diagnosis: Other abnormalities of gait and mobility (R26.89);Repeated falls (R29.6);Muscle weakness (generalized) (M62.81);Pain Pain - Right/Left: Left Pain - part of body: Leg                Time: [id number] OT Time Calculation (min): 41 min Charges:  OT Evaluation $OT Eval High Complexity: 1 High OT Treatments $Therapeutic Activity: 8-22 mins  Hanley Hays, MPH, MS, OTR/L ascom [phone] 06/24/21, 1:28 PM

## 2021-06-24 NOTE — Progress Notes (Signed)
PROGRESS NOTE    Pamela Ball  [id number] DOB: [birth year] DOA: 06/22/2021 PCP: Olin Hauser, DO    Brief Narrative:  Pamela Ball is a 75 y.o. female with medical history significant for COPD, HTN, bipolar mood disorder, frequent falls, most recently 6/28 when she suffered a left acetabular and left humeral fracture, now ambulate with a cane who presents to the ED following a mechanical fall when she tripped over her cane    Consultants:  Orthopedics  Procedures:   Antimicrobials:      Subjective: Has no complaints this AM.  Denies shortness of breath, chest pain, leg pain  Objective: Vitals:   06/23/21 1748 06/23/21 2110 06/24/21 0601 06/24/21 0747  BP: 115/65 (!) 146/63 (!) 152/64 (!) 146/52  Pulse: 74 89 94 88  Resp: '15 16 17 16  '$ Temp: 97.6 F (36.4 C) 98.1 F (36.7 C) 98.6 F (37 C) 98.4 F (36.9 C)  TempSrc: Oral     SpO2:  95% 96% 93%  Weight: 55.7 kg     Height: '4\' 10"'$  (1.473 m)       Intake/Output Summary (Last 24 hours) at 06/24/2021 0905 Last data filed at 06/24/2021 0400 Gross per 24 hour  Intake 528.73 ml  Output 1650 ml  Net -1121.27 ml   Filed Weights   06/23/21 1748  Weight: 55.7 kg    Examination:  General exam: Appears calm and comfortable  Respiratory system: Clear to auscultation. Respiratory effort normal. Cardiovascular system: S1 & S2 heard, RRR. No JVD, murmurs, rubs, gallops or clicks.  Gastrointestinal system: Abdomen is nondistended, soft and nontender. No organomegaly or masses felt. Normal bowel sounds heard. Central nervous system: Alert and oriented.  Grossly intact Extremities: Left lower extremity dressing in place, swelling and ecchymosis right lower extremity no Skin warm dry  psychiatry: Judgement and insight appear normal. Mood & affect appropriate.     Data Reviewed: I have personally reviewed following labs and imaging studies  CBC: Recent Labs  Lab 06/22/21 2317 06/23/21 1810  06/24/21 0346  WBC 4.4 6.6 4.6  NEUTROABS 2.1  --   --   HGB 13.9 13.3 12.0  HCT 41.8 39.9 36.9  MCV 99.3 100.3* 101.9*  PLT 238 233 [id number]   Basic Metabolic Panel: Recent Labs  Lab 06/22/21 2317 06/23/21 1810 06/24/21 0346  NA 141  --  138  K 3.5  --  3.2*  CL 102  --  103  CO2 26  --  27  GLUCOSE 86  --  177*  BUN 10  --  11  CREATININE 0.57 0.49 0.55  CALCIUM 8.8*  --  7.5*   GFR: Estimated Creatinine Clearance: 44.9 mL/min (by C-G formula based on SCr of 0.55 mg/dL). Liver Function Tests: No results for input(s): AST, ALT, ALKPHOS, BILITOT, PROT, ALBUMIN in the last 168 hours. No results for input(s): LIPASE, AMYLASE in the last 168 hours. No results for input(s): AMMONIA in the last 168 hours. Coagulation Profile: No results for input(s): INR, PROTIME in the last 168 hours. Cardiac Enzymes: Recent Labs  Lab 06/22/21 2317  CKTOTAL 389*   BNP (last 3 results) No results for input(s): PROBNP in the last 8760 hours. HbA1C: No results for input(s): HGBA1C in the last 72 hours. CBG: No results for input(s): GLUCAP in the last 168 hours. Lipid Profile: No results for input(s): CHOL, HDL, LDLCALC, TRIG, CHOLHDL, LDLDIRECT in the last 72 hours. Thyroid Function Tests: No results for input(s): TSH, T4TOTAL, FREET4, T3FREE,  THYROIDAB in the last 72 hours. Anemia Panel: No results for input(s): VITAMINB12, FOLATE, FERRITIN, TIBC, IRON, RETICCTPCT in the last 72 hours. Sepsis Labs: No results for input(s): PROCALCITON, LATICACIDVEN in the last 168 hours.  Recent Results (from the past 240 hour(s))  Resp Panel by RT-PCR (Flu A&B, Covid) Nasopharyngeal Swab     Status: None   Collection Time: 06/23/21  1:01 AM   Specimen: Nasopharyngeal Swab; Nasopharyngeal(NP) swabs in vial transport medium  Result Value Ref Range Status   SARS Coronavirus 2 by RT PCR NEGATIVE NEGATIVE Final    Comment: (NOTE) SARS-CoV-2 target nucleic acids are NOT DETECTED.  The SARS-CoV-2 RNA is  generally detectable in upper respiratory specimens during the acute phase of infection. The lowest concentration of SARS-CoV-2 viral copies this assay can detect is 138 copies/mL. A negative result does not preclude SARS-Cov-2 infection and should not be used as the sole basis for treatment or other patient management decisions. A negative result may occur with  improper specimen collection/handling, submission of specimen other than nasopharyngeal swab, presence of viral mutation(s) within the areas targeted by this assay, and inadequate number of viral copies(<138 copies/mL). A negative result must be combined with clinical observations, patient history, and epidemiological information. The expected result is Negative.  Fact Sheet for Patients:  EntrepreneurPulse.com.au  Fact Sheet for Healthcare Providers:  IncredibleEmployment.be  This test is no t yet approved or cleared by the [place] FDA and  has been authorized for detection and/or diagnosis of SARS-CoV-2 by FDA under an Emergency Use Authorization (EUA). This EUA will remain  in effect (meaning this test can be used) for the duration of the COVID-19 declaration under Section 564(b)(1) of the Act, 21 U.S.C.section 360bbb-3(b)(1), unless the authorization is terminated  or revoked sooner.       Influenza A by PCR NEGATIVE NEGATIVE Final   Influenza B by PCR NEGATIVE NEGATIVE Final    Comment: (NOTE) The Xpert Xpress SARS-CoV-2/FLU/RSV plus assay is intended as an aid in the diagnosis of influenza from Nasopharyngeal swab specimens and should not be used as a sole basis for treatment. Nasal washings and aspirates are unacceptable for Xpert Xpress SARS-CoV-2/FLU/RSV testing.  Fact Sheet for Patients: EntrepreneurPulse.com.au  Fact Sheet for Healthcare Providers: IncredibleEmployment.be  This test is not yet approved or cleared by the [place] FDA and has been authorized for detection and/or diagnosis of SARS-CoV-2 by FDA under an Emergency Use Authorization (EUA). This EUA will remain in effect (meaning this test can be used) for the duration of the COVID-19 declaration under Section 564(b)(1) of the Act, 21 U.S.C. section 360bbb-3(b)(1), unless the authorization is terminated or revoked.  Performed at [facility], [address] [place], [place] [address]          Radiology Studies: DG Tibia/Fibula Left  Result Date: 06/23/2021 CLINICAL DATA:  Fall and left lower extremity pain. EXAM: DG HIP (WITH OR WITHOUT PELVIS) 2-3V LEFT; LEFT KNEE - COMPLETE 4+ VIEW; LEFT TIBIA AND FIBULA - 2 VIEW COMPARISON:  None. FINDINGS: There is a comminuted, displaced, and mildly impacted fracture of the distal femur. There is dorsal angulation of the distal fracture fragment. There are displaced fractures of the left superior and inferior pubic rami, age indeterminate, likely acute. There is no dislocation. The bones are osteopenic. Lower lumbar vertebroplasty. Ventral hernia repair mesh. IMPRESSION: 1. Comminuted, displaced and angulated fracture of the distal femur. 2. Displaced fractures of the left superior and inferior pubic rami, likely acute. Electronically  Signed   By: Anner Crete M.D.   On: 06/23/2021 00:19   CT HEAD WO CONTRAST (5MM)  Result Date: 06/22/2021 CLINICAL DATA:  Recent fall with headaches and neck pain, initial encounter EXAM: CT HEAD WITHOUT CONTRAST CT CERVICAL SPINE WITHOUT CONTRAST TECHNIQUE: Multidetector CT imaging of the head and cervical spine was performed following the standard protocol without intravenous contrast. Multiplanar CT image reconstructions of the cervical spine were also generated. COMPARISON:  05/29/2020 FINDINGS: CT HEAD FINDINGS Brain: Mild atrophic changes are noted. 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 No acute hemorrhage or acute infarct is noted.  Vascular: No hyperdense vessel or unexpected calcification. Skull: Normal. Negative for fracture or focal lesion. Sinuses/Orbits: Mucosal retention cyst is noted within sphenoid sinus Other: None CT CERVICAL SPINE FINDINGS Alignment: Within normal limits. Skull base and vertebrae: 7 cervical segments are well visualized. Vertebral body height is well maintained. Multi level disc space narrowing from C4-C7 is noted with osteophytic change. Facet hypertrophic changes are noted as well. No acute fracture or acute facet abnormality is noted. Soft tissues and spinal canal: Surrounding soft tissue structures are within normal limits. Upper chest: Visualized lung apices are within normal limits. Other: None IMPRESSION: CT of the head: Mild atrophic changes are noted. Lacunar infarct in the right basal ganglia unchanged from the prior exam CT of the cervical spine: Multilevel degenerative change without acute abnormality. Electronically Signed   By: Inez Catalina M.D.   On: 06/22/2021 23:58   CT Cervical Spine Wo Contrast  Result Date: 06/22/2021 CLINICAL DATA:  Recent fall with headaches and neck pain, initial encounter EXAM: CT HEAD WITHOUT CONTRAST CT CERVICAL SPINE WITHOUT CONTRAST TECHNIQUE: Multidetector CT imaging of the head and cervical spine was performed following the standard protocol without intravenous contrast. Multiplanar CT image reconstructions of the cervical spine were also generated. COMPARISON:  05/29/2020 FINDINGS: CT HEAD FINDINGS Brain: Mild atrophic changes are noted. 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 No acute hemorrhage or acute infarct is noted. Vascular: No hyperdense vessel or unexpected calcification. Skull: Normal. Negative for fracture or focal lesion. Sinuses/Orbits: Mucosal retention cyst is noted within sphenoid sinus Other: None CT CERVICAL SPINE FINDINGS Alignment: Within normal limits. Skull base and vertebrae: 7 cervical segments are well visualized.  Vertebral body height is well maintained. Multi level disc space narrowing from C4-C7 is noted with osteophytic change. Facet hypertrophic changes are noted as well. No acute fracture or acute facet abnormality is noted. Soft tissues and spinal canal: Surrounding soft tissue structures are within normal limits. Upper chest: Visualized lung apices are within normal limits. Other: None IMPRESSION: CT of the head: Mild atrophic changes are noted. Lacunar infarct in the right basal ganglia unchanged from the prior exam CT of the cervical spine: Multilevel degenerative change without acute abnormality. Electronically Signed   By: Inez Catalina M.D.   On: 06/22/2021 23:58   DG Chest Portable 1 View  Result Date: 06/23/2021 CLINICAL DATA:  History of recent fall with known femoral fractures,, initial encounter EXAM: PORTABLE CHEST 1 VIEW COMPARISON:  12/26/2018 FINDINGS: Cardiac shadow is mildly prominent. Aortic calcifications are noted. The lungs are well aerated bilaterally. No focal infiltrate or effusion is seen. Chronic deformity of the proximal humeri are seen. IMPRESSION: Chronic changes without acute abnormality. Electronically Signed   By: Inez Catalina M.D.   On: 06/23/2021 00:16   DG Knee Complete 4 Views Left  Result Date: 06/23/2021 CLINICAL DATA:  Fall and left lower extremity pain. EXAM: DG HIP (WITH OR WITHOUT PELVIS) 2-3V LEFT; LEFT KNEE - COMPLETE 4+ VIEW; LEFT TIBIA AND FIBULA - 2 VIEW COMPARISON:  None. FINDINGS: There is a comminuted, displaced, and mildly impacted fracture of the distal femur. There is dorsal angulation of the distal fracture fragment. There are displaced fractures of the left superior and inferior pubic rami, age indeterminate, likely acute. There is no dislocation. The bones are osteopenic. Lower lumbar vertebroplasty. Ventral hernia repair mesh. IMPRESSION: 1. Comminuted, displaced and angulated fracture of the distal femur. 2. Displaced fractures of the left superior and  inferior pubic rami, likely acute. Electronically Signed   By: Anner Crete M.D.   On: 06/23/2021 00:19   DG C-Arm 1-60 Min  Result Date: 06/23/2021 CLINICAL DATA:  Elective surgery. Provided fluoroscopy time 4 minutes, 30 seconds (31.0 mGy). EXAM: DG C-ARM 1-60 MIN; LEFT FEMUR 2 VIEWS COMPARISON:  Radiographs of the right hip and knee 06/22/2021 FINDINGS: Four intraoperative fluoroscopic images of the left femur are submitted. On the provided images, there is evidence of interval ORIF of a known comminuted distal left femoral fracture utilizing a lateral plate and multiple interlocking screws. Persistent although improved displacement at the fracture sites. IMPRESSION: Four intraoperative fluoroscopic images of the left femur from ORIF, as described. Electronically Signed   By: Kellie Simmering D.O.   On: 06/23/2021 16:04   DG Hip Unilat W or Wo Pelvis 2-3 Views Left  Result Date: 06/23/2021 CLINICAL DATA:  Fall and left lower extremity pain. EXAM: DG HIP (WITH OR WITHOUT PELVIS) 2-3V LEFT; LEFT KNEE - COMPLETE 4+ VIEW; LEFT TIBIA AND FIBULA - 2 VIEW COMPARISON:  None. FINDINGS: There is a comminuted, displaced, and mildly impacted fracture of the distal femur. There is dorsal angulation of the distal fracture fragment. There are displaced fractures of the left superior and inferior pubic rami, age indeterminate, likely acute. There is no dislocation. The bones are osteopenic. Lower lumbar vertebroplasty. Ventral hernia repair mesh. IMPRESSION: 1. Comminuted, displaced and angulated fracture of the distal femur. 2. Displaced fractures of the left superior and inferior pubic rami, likely acute. Electronically Signed   By: Anner Crete M.D.   On: 06/23/2021 00:19   DG FEMUR MIN 2 VIEWS LEFT  Result Date: 06/23/2021 CLINICAL DATA:  Elective surgery. Provided fluoroscopy time 4 minutes, 30 seconds (31.0 mGy). EXAM: DG C-ARM 1-60 MIN; LEFT FEMUR 2 VIEWS COMPARISON:  Radiographs of the right hip and knee  06/22/2021 FINDINGS: Four intraoperative fluoroscopic images of the left femur are submitted. On the provided images, there is evidence of interval ORIF of a known comminuted distal left femoral fracture utilizing a lateral plate and multiple interlocking screws. Persistent although improved displacement at the fracture sites. IMPRESSION: Four intraoperative fluoroscopic images of the left femur from ORIF, as described. Electronically Signed   By: Kellie Simmering D.O.   On: 06/23/2021 16:04        Scheduled Meds:  amLODipine  10 mg Oral Daily   vitamin C  500 mg Oral Daily   buPROPion  150 mg Oral Daily   busPIRone  10 mg Oral Daily   Chlorhexidine Gluconate Cloth  6 each Topical Daily   cholecalciferol  2,000 Units Oral Daily   docusate sodium  100 mg Oral BID   enoxaparin (LOVENOX) injection  40 mg Subcutaneous Q24H   ferrous sulfate  325 mg Oral Q breakfast   multivitamin-lutein  1 capsule Oral Daily   OLANZapine  7.5  mg Oral Daily   pantoprazole  40 mg Oral Daily   potassium chloride  10 mEq Oral Daily   simvastatin  20 mg Oral QHS   venlafaxine XR  75 mg Oral Q breakfast   vitamin B-12  3,000 mcg Oral Daily   Continuous Infusions:  sodium chloride 75 mL/hr at 06/24/21 [id number]    ceFAZolin (ANCEF) IV 2 g (06/24/21 0106)   methocarbamol (ROBAXIN) IV      Assessment & Plan:   Principal Problem:   Closed comminuted intra-articular fracture of distal femur, left, initial encounter ([place]) Active Problems:   Anxiety   COPD (chronic obstructive pulmonary disease) (HCC)   Essential hypertension   Accidental fall   Preoperative clearance   Recurrent falls   Closed fracture of multiple pubic rami, left, initial encounter [facility])   75 year old female with history of COPD, HTN, bipolar mood disorder, frequent falls, most recently 6/28 when she suffered a left acetabular and left humeral fracture, now ambulate with a cane who presents to the ED following a mechanical fall when she tripped  over her cane.      Closed comminuted intra-articular fracture of distal femur, left, initial encounter ([place])   Closed fracture of multiple pubic rami, left, initial encounter    Accidental fall/recurrent falls 8/19 status post open reduction internal fixation distal femur fracture Ortho following Pain management Lovenox, foot pumps, TED hose for DVT prophylaxis Use Lovenox 40 mg subcu daily x14 days Change dressing to honeycomb at discharge Follow-up with [facility] in 2 weeks PT recommends SNF       Anxiety/bipolar mood disorder Continue outpatient mood stabilizers      COPD (chronic obstructive pulmonary disease) (HCC) Stable without acute exacerbation Continue inhalers as needed     Essential hypertension Stable continue amlodipine   DVT prophylaxis: Lovenox Code Status: Full Family Communication: None at bedside Disposition Plan:  Status is: Inpatient  Remains inpatient appropriate because:Inpatient level of care appropriate due to severity of illness  Dispo: The patient is from: Home              Anticipated d/c is to: SNF              Patient currently is not medically stable to d/c.   Difficult to place patient No            LOS: 1 day   Time spent: 35 minutes with more than 50% COC    Nolberto Hanlon, MD [facility] Pager [phone]  If 7PM-7AM, please contact night-coverage 06/24/2021, 9:05 AM

## 2021-06-24 NOTE — Telephone Encounter (Signed)
Micah with [facility] PT called to advise the pt fell at home 8/17 and broke her leg.  Pt admitted to [facility] and was to have surgery 8/18. He wanted dr to know she had cancelled her appt and why.  Cb [phone]

## 2021-06-24 NOTE — Progress Notes (Signed)
Subjective: 1 Day Post-Op Procedure(s) (LRB): OPEN REDUCTION INTERNAL FIXATION (ORIF) DISTAL FEMUR FRACTURE (Left) Patient reports pain as mild.   Patient is well, and has had no acute complaints or problems Denies any CP, SOB, ABD pain. We will start therapy today.   Objective: Vital signs in last 24 hours: Temp:  [97.6 F (36.4 C)-99.1 F (37.3 C)] 98.4 F (36.9 C) (08/19 0747) Pulse Rate:  [68-94] 88 (08/19 0747) Resp:  [15-32] 16 (08/19 0747) BP: (115-154)/(52-73) 146/52 (08/19 0747) SpO2:  [89 %-96 %] 93 % (08/19 0747) Weight:  [55.7 kg] 55.7 kg (08/18 1748)  Intake/Output from previous day: 08/18 0701 - 08/19 0700 In: 528.7 [I.V.:403.7; IV Piggyback:125] Out: [id number] [Urine:1600; Blood:50] Intake/Output this shift: No intake/output data recorded.  Recent Labs    06/22/21 2317 06/23/21 1810 06/24/21 0346  HGB 13.9 13.3 12.0   Recent Labs    06/23/21 1810 06/24/21 0346  WBC 6.6 4.6  RBC 3.98 3.62*  HCT 39.9 36.9  PLT 233 217   Recent Labs    06/22/21 2317 06/23/21 1810 06/24/21 0346  NA 141  --  138  K 3.5  --  3.2*  CL 102  --  103  CO2 26  --  27  BUN 10  --  11  CREATININE 0.57 0.49 0.55  GLUCOSE 86  --  177*  CALCIUM 8.8*  --  7.5*   No results for input(s): LABPT, INR in the last 72 hours.  EXAM General - Patient is Alert, Appropriate, and Oriented Extremity - Neurovascular intact Sensation intact distally Intact pulses distally Dorsiflexion/Plantar flexion intact No cellulitis present Compartment soft Dressing - dressing C/D/I and no drainage Motor Function - intact, moving foot and toes well on exam.   Past Medical History:  Diagnosis Date   Abdominal hernia with obstruction and without gangrene    Anxiety 02/15/2017   denies   Asthma    Bipolar 1 disorder, depressed, full remission ([place]) 02/15/2017   Bipolar affective disorder (HCC)    COPD (chronic obstructive pulmonary disease) (HCC)    Depression    GERD (gastroesophageal  reflux disease) 02/15/2017   Glaucoma    Headache    Hiatal hernia    History of abdominal hernia 05/21/2017   History of compression fracture of spine 02/15/2017   Hyperlipidemia    Hypertension    Incisional hernia    Incisional ventral hernia w obstruction 06/02/2017   Normocytic anemia 05/23/2017   Osteoarthritis of knees, 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   Presbycusis of both ears 02/15/2017    Assessment/Plan:   1 Day Post-Op Procedure(s) (LRB): OPEN REDUCTION INTERNAL FIXATION (ORIF) DISTAL FEMUR FRACTURE (Left) Principal Problem:   Closed comminuted intra-articular fracture of distal femur, left, initial encounter (HCC) Active Problems:   Anxiety   COPD (chronic obstructive pulmonary disease) ([place])   Essential hypertension   Accidental fall   Preoperative clearance   Recurrent falls   Closed fracture of multiple pubic rami, left, initial encounter ([place])  Estimated body mass index is 25.66 kg/m as calculated from the following:   Height as of this encounter: '4\' 10"'$  (1.473 m).   Weight as of this encounter: 55.7 kg. Advance diet Up with therapy, 20% WB LLE Pain controlled VSS Labs stable CM to assist with discharge  Follow up with [facility] in 2 weeks Lovenox 40 mg SQ daily x 14days Change dressing to Honeycomb at discharge     DVT Prophylaxis - Lovenox, Foot Pumps, and TED hose  Ronney Asters, PA-C [place] 06/24/2021, 8:12 AM

## 2021-06-24 NOTE — Progress Notes (Signed)
Initial Nutrition Assessment  DOCUMENTATION CODES:  Non-severe (moderate) malnutrition in context of chronic illness  INTERVENTION:  Add Ensure Plus po TID, each supplement provides 350 kcal and 13 grams of protein.   Add Magic cup TID with meals, each supplement provides 290 kcal and 9 grams of protein.  Continue vitamin supplementation as needed.  NUTRITION DIAGNOSIS:  Moderate Malnutrition related to chronic illness (COPD) as evidenced by mild fat depletion, mild muscle depletion.  GOAL:  Patient will meet greater than or equal to 90% of their needs  MONITOR:  PO intake, Supplement acceptance, Labs, Weight trends, Skin, I & O's  REASON FOR ASSESSMENT:  Consult Hip fracture protocol  ASSESSMENT:  75 yo female with a PMH of COPD, HTN, bipolar mood disorder, frequent falls, most recently 6/28 when she suffered a left acetabular and left humeral fracture, now ambulate with a cane who presents to the ED following a mechanical fall when she tripped over her cane. 8/18 - ORIF distal femur fracture procedure  Spoke with pt and husband at bedside. Husband reports that pt ate well before coming to the hospital and that she has had no weight changes. Per Epic, pt ate 50% of breakfast this morning.  Per Epic, pt's weight has remained stable for the past 11 months.  On exam, with some mild depletions.  Recommend adding Ensure Plus TID and Magic Cup TID to promote intake.  Medications: reviewed; Vitamin C, Vitamin D, colace BID, ferrous sulfate, MVI-lutein, Protonix, Klor-Con 10 mEq, Vitamin B12, NaCl @ 75 ml/hr, Vicodin PRN (given once today), morphine PRN (given once today), oxycodone PRN (given once today)  Labs: reviewed; K 3.2 (L), Glucose 177 (H)  NUTRITION - FOCUSED PHYSICAL EXAM: Flowsheet Row Most Recent Value  Orbital Region Mild depletion  Upper Arm Region Mild depletion  Thoracic and Lumbar Region No depletion  Buccal Region Mild depletion  Temple Region Mild depletion   Clavicle Bone Region No depletion  Clavicle and Acromion Bone Region No depletion  Scapular Bone Region Unable to assess  Dorsal Hand Severe depletion  Patellar Region No depletion  Anterior Thigh Region No depletion  Posterior Calf Region No depletion  Edema (RD Assessment) None  Hair Reviewed  Eyes Reviewed  Mouth Reviewed  Skin Reviewed  Nails Reviewed   Diet Order:   Diet Order             Diet regular Room service appropriate? Yes; Fluid consistency: Thin  Diet effective now                  EDUCATION NEEDS:  Education needs have been addressed  Skin:  Skin Assessment: Skin Integrity Issues: Skin Integrity Issues:: Incisions Incisions: L leg, closed  Last BM:  06/22/21  Height:  Ht Readings from Last 1 Encounters:  06/23/21 '4\' 10"'$  (1.473 m)   Weight:  Wt Readings from Last 1 Encounters:  06/23/21 55.7 kg   BMI:  Body mass index is 25.66 kg/m.  Estimated Nutritional Needs:  Kcal:  1500-1700 Protein:  70-85 grams Fluid:  >1.5 L  Derrel Nip, RD, LDN (she/her/hers) Registered Dietitian I After-Hours/Weekend Pager # in [place]

## 2021-06-25 NOTE — Progress Notes (Signed)
PT Cancellation Note  Patient Details Name: Pamela Ball MRN: [id number] DOB: 16-Oct-1946   Cancelled Treatment:    Reason Eval/Treat Not Completed: Pain limiting ability to participate.  Pt reporting 8/10 L LE pain and requesting pain medication prior to participating in therapy: nurse notified.  Will re-attempt PT treatment session at a later time.  Leitha Bleak, PT 06/25/21, 9:54 AM

## 2021-06-25 NOTE — Progress Notes (Addendum)
Occupational Therapy Treatment Patient Details Name: JENAY DEHAAN MRN: [id number] DOB: 02-13-1946 Today's Date: 06/25/2021    History of present illness Pt is a 75 y.o. female s/p mechanical fall (tripped on cane).  Per chart pt also had a fall May 03, 2021 with L acetabular fx and L humeral fx (L UE still NWB'ing); non-operative.  Imaging (from most recent fall) showing comminuted displaced and angulated fx of distal femur and displaced fx's of L superior and inferior pubic rami (likely acute).  S/p ORIF L distal femur fx 8/18 (L LE PWB'ing 20%).  PMH includes abdominal hernia, anxiety, bipolar affective disorder, COPD, htn, kyphoplasty, ORIF L distal humerus fx 08/07/2019, ventral hernia repair, and frequent falls.   OT comments  Pt. Tolerated PROM to the left shoulder for flexion, abduction, and horizontal abduction transitioning to [facility] for shoulder flexion, and Abduction. Pt./husband education was provided about PROM to the left shoulder. Pt. Performed light grooming with minA. Pt. education was provided about general reacher use. Pt. Continues to benefit from OT services for ADL training, A/E training, and pt. education about home modification, and DME. Pt. would benefit from SNF level of care upon discharge, with follow-up OT services.    Follow Up Recommendations  SNF    Equipment Recommendations  Other (comment) (Defer to next venue of care)    Recommendations for Other Services      Precautions / Restrictions Precautions Precautions: Fall Precaution Comments: Per MD Rudene Christians 8/19 regarding L LE: no limit on L knee ROM except pain; no brace Required Braces or Orthoses: Other Brace Other Brace: Pt has shoulder sling for L UE that she has been wearing when mobilizing (per OP ortho note 06/09/21 pt does not need to wear her sling)--spouse brought sling into hospital Restrictions Weight Bearing Restrictions: Yes LUE Weight Bearing: Non weight bearing LLE Weight Bearing: Partial weight  bearing LLE Partial Weight Bearing Percentage or Pounds: 20% Other Position/Activity Restrictions: Per OP ortho note 06/09/21 regarding L UE (non-operative):"remain nonweightbearing to left upper extremity. She does not need to wear her sling at this time. Increased passive range of motion to left upper extremity and begin active assisted motion as tolerated to the left arm".       Mobility Bed Mobility Overal bed mobility: Needs Assistance Bed Mobility: Supine to Sit;Sit to Supine     Supine to sit: Max assist;HOB elevated Sit to supine: Max assist;HOB elevated   General bed mobility comments: Deferred    Transfers Overall transfer level: Needs assistance Equipment used: None Transfers: Lateral/Scoot Transfers          Lateral/Scoot Transfers: Max assist General transfer comment: Deferred following PT    Balance                                   ADL either performed or assessed with clinical judgement   ADL Overall ADL's : Needs assistance/impaired     Grooming: Minimal assistance;Set up;Moderate assistance   Upper Body Bathing: Moderate assistance   Lower Body Bathing: Maximal assistance   Upper Body Dressing : Moderate assistance   Lower Body Dressing: Maximal assistance                       Vision Baseline Vision/History: Macular Degeneration;Legally blind     Perception     Praxis      Cognition Arousal/Alertness: Awake/alert Behavior During Therapy: WFL for tasks  assessed/performed Overall Cognitive Status: Difficult to assess                                 General Comments: Alert and oriented to person and place but not time.        Exercises Total Joint Exercises Ankle Circles/Pumps: AROM;Strengthening;5 reps;10 reps;Supine Heel Slides: AAROM;Strengthening;Left;10 reps;Supine (decreased L knee flexion ROM per pt comfort) Hip ABduction/ADduction: AAROM;Strengthening;Left;10 reps;Supine Long Arc Quad:  AROM;Strengthening;Right;Seated (10 reps x2)   Shoulder Instructions       General Comments      Pertinent Vitals/ Pain       Pain Assessment: No/denies pain Faces Pain Scale: Hurts little more Pain Location: LLE incision with movement Pain Descriptors / Indicators: Grimacing;Guarding Pain Intervention(s): Limited activity within patient's tolerance;Monitored during session;Premedicated before session;Repositioned  Home Living                                          Prior Functioning/Environment              Frequency  Min 2X/week        Progress Toward Goals  OT Goals(current goals can now be found in the care plan section)  Progress towards OT goals: Progressing toward goals  Acute Rehab OT Goals Patient Stated Goal: to improve mobility OT Goal Formulation: With patient/family Time For Goal Achievement: 07/08/21 Potential to Achieve Goals: Good  Plan  (Nurse present with provide medication)    Co-evaluation                 AM-PAC OT "6 Clicks" Daily Activity     Outcome Measure   Help from another person eating meals?: A Little Help from another person taking care of personal grooming?: A Lot Help from another person toileting, which includes using toliet, bedpan, or urinal?: Total Help from another person bathing (including washing, rinsing, drying)?: A Lot Help from another person to put on and taking off regular upper body clothing?: A Lot Help from another person to put on and taking off regular lower body clothing?: A Lot 6 Click Score: 12    End of Session    OT Visit Diagnosis: Other abnormalities of gait and mobility (R26.89);Repeated falls (R29.6);Muscle weakness (generalized) (M62.81)   Activity Tolerance Patient tolerated treatment well   Patient Left in bed;with call bell/phone within reach;with bed alarm set   Nurse Communication          Time: [id number] OT Time Calculation (min): 23 min  Charges: OT  General Charges $OT Visit: 1 Visit OT Treatments $Self Care/Home Management : 23-37 mins  Harrel Carina, MS, OTR/L    Harrel Carina 06/25/2021, 2:54 PM

## 2021-06-25 NOTE — Progress Notes (Signed)
Physical Therapy Treatment Patient Details Name: Pamela Ball MRN: [id number] DOB: 02-05-1946 Today's Date: 06/25/2021    History of Present Illness Pt is a 75 y.o. female s/p mechanical fall (tripped on cane).  Per chart pt also had a fall May 03, 2021 with L acetabular fx and L humeral fx (L UE still NWB'ing); non-operative.  Imaging (from most recent fall) showing comminuted displaced and angulated fx of distal femur and displaced fx's of L superior and inferior pubic rami (likely acute).  S/p ORIF L distal femur fx 8/18 (L LE PWB'ing 20%).  PMH includes abdominal hernia, anxiety, bipolar affective disorder, COPD, htn, kyphoplasty, ORIF L distal humerus fx 08/07/2019, ventral hernia repair, and frequent falls.    PT Comments    Pt resting in bed upon PT arrival; initially pt sleepy but agreeable to therapy and became alert with sessions activities.  Tolerated L LE bed level ex's with assist.  L UE sling donned for mobility and doffed end of session.  Max assist for bed mobility; SBA for sitting balance; and max assist for lateral scooting towards L (sitting edge of bed).  Will continue to focus on strengthening and progressive functional mobility during hospitalization.   Follow Up Recommendations  SNF     Equipment Recommendations  Wheelchair (measurements PT);Wheelchair cushion (measurements PT);3in1 (PT) (hoyer lift)    Recommendations for Other Services OT consult     Precautions / Restrictions Precautions Precautions: Fall Precaution Comments: Per MD Rudene Christians 8/19 regarding L LE: no limit on L knee ROM except pain; no brace Required Braces or Orthoses: Other Brace Other Brace: Pt has shoulder sling for L UE that she has been wearing when mobilizing (per OP ortho note 06/09/21 pt does not need to wear her sling)--spouse brought sling into hospital Restrictions Weight Bearing Restrictions: Yes LUE Weight Bearing: Non weight bearing LLE Weight Bearing: Partial weight bearing LLE  Partial Weight Bearing Percentage or Pounds: 20% Other Position/Activity Restrictions: Per OP ortho note 06/09/21 regarding L UE (non-operative):"remain nonweightbearing to left upper extremity. She does not need to wear her sling at this time. Increased passive range of motion to left upper extremity and begin active assisted motion as tolerated to the left arm".    Mobility  Bed Mobility Overal bed mobility: Needs Assistance Bed Mobility: Supine to Sit;Sit to Supine     Supine to sit: Max assist;HOB elevated Sit to supine: Max assist;HOB elevated   General bed mobility comments: assist for trunk and L>R LE; vc's for technique and L UE safety/NWB'ing    Transfers Overall transfer level: Needs assistance Equipment used: None Transfers: Lateral/Scoot Transfers          Lateral/Scoot Transfers: Max assist General transfer comment: vc's for L LE NWB'ing; pt pushing through R UE and R LE; L UE in sling to keep [place]; vc's for technique and positioning  Ambulation/Gait             General Gait Details: not appropriate at this time   Stairs             Wheelchair Mobility    Modified Rankin (Stroke Patients Only)       Balance Overall balance assessment: Needs assistance Sitting-balance support: No upper extremity supported;Feet unsupported Sitting balance-Leahy Scale: Fair Sitting balance - Comments: steady static sitting       Standing balance comment: not appropriate for standing at this time  Cognition Arousal/Alertness: Awake/alert (Initially sleepy but woke up with activities) Behavior During Therapy: [facility] for tasks assessed/performed                                   General Comments: Alert and oriented to person and place but not time.      Exercises Total Joint Exercises Ankle Circles/Pumps: AROM;Strengthening;5 reps;10 reps;Supine Heel Slides: AAROM;Strengthening;Left;10 reps;Supine  (decreased L knee flexion ROM per pt comfort) Hip ABduction/ADduction: AAROM;Strengthening;Left;10 reps;Supine Long Arc Quad: AROM;Strengthening;Right;Seated (10 reps x2)    General Comments  Pt agreeable to PT session.  Pt's husband present beginning/end of session but stepped out during sessions activities.       Pertinent Vitals/Pain Pain Assessment: Faces Faces Pain Scale: Hurts little more Pain Location: LLE incision with movement Pain Descriptors / Indicators: Grimacing;Guarding Pain Intervention(s): Limited activity within patient's tolerance;Monitored during session;Premedicated before session;Repositioned Vitals (HR and O2 on 2 L via nasal cannula) stable and WFL throughout treatment session.    Home Living                      Prior Function            PT Goals (current goals can now be found in the care plan section) Acute Rehab PT Goals Patient Stated Goal: to improve mobility PT Goal Formulation: With patient/family Time For Goal Achievement: 07/08/21 Potential to Achieve Goals: Fair Progress towards PT goals: Progressing toward goals    Frequency    7X/week      PT Plan Current plan remains appropriate    Co-evaluation              AM-PAC PT "6 Clicks" Mobility   Outcome Measure  Help needed turning from your back to your side while in a flat bed without using bedrails?: A Lot Help needed moving from lying on your back to sitting on the side of a flat bed without using bedrails?: Total Help needed moving to and from a bed to a chair (including a wheelchair)?: Total Help needed standing up from a chair using your arms (e.g., wheelchair or bedside chair)?: Total Help needed to walk in hospital room?: Total Help needed climbing 3-5 steps with a railing? : Total 6 Click Score: 7    End of Session Equipment Utilized During Treatment: Gait belt Activity Tolerance: Patient tolerated treatment well Patient left: in bed;with call bell/phone  within reach;with bed alarm set;Other (comment);with family/visitor present (B heels floating via pillow support) Nurse Communication: Mobility status;Precautions;Weight bearing status PT Visit Diagnosis: Other abnormalities of gait and mobility (R26.89);Muscle weakness (generalized) (M62.81);Repeated falls (R29.6);Pain Pain - Right/Left: Left Pain - part of body: Knee     Time: 1124-1202 PT Time Calculation (min) (ACUTE ONLY): 38 min  Charges:  $Therapeutic Exercise: 8-22 mins $Therapeutic Activity: 23-37 mins                    Leitha Bleak, PT 06/25/21, 12:24 PM

## 2021-06-25 NOTE — Progress Notes (Signed)
PROGRESS NOTE    HEMEN BOARDLEY  [id number] DOB: Feb 26, 1946 DOA: 06/22/2021 PCP: Olin Hauser, DO    Brief Narrative:  Pamela Ball is a 75 y.o. female with medical history significant for COPD, HTN, bipolar mood disorder, frequent falls, most recently 6/28 when she suffered a left acetabular and left humeral fracture, now ambulate with a cane who presents to the ED following a mechanical fall when she tripped over her cane  8/20 no overnight issues  Consultants:  Orthopedics  Procedures:   Antimicrobials:      Subjective: Had leg pain earlier and took pain meds.  No shortness of breath or chest pain.  Objective: Vitals:   06/24/21 1934 06/24/21 2306 06/25/21 0501 06/25/21 0759  BP: (!) 126/56 (!) 128/55 133/70 (!) 130/50  Pulse: 91 86 98 70  Resp: '20 20 20 17  '$ Temp: 98 F (36.7 C) 98.4 F (36.9 C) 98.4 F (36.9 C) 98 F (36.7 C)  TempSrc:  Oral    SpO2: 93% 92% 93% 95%  Weight:      Height:        Intake/Output Summary (Last 24 hours) at 06/25/2021 0827 Last data filed at 06/24/2021 1422 Gross per 24 hour  Intake 240 ml  Output --  Net 240 ml   Filed Weights   06/23/21 1748  Weight: 55.7 kg    Examination: NAD, calm CTA no wheeze rales rhonchi's regular S1-S2 no gallops Soft benign positive bowel sounds No edema of the right lower extremity, left lower extremity with edema ecchymosis dressing in place Aaxox4 Mood and affect appropriate in current setting    Data Reviewed: I have personally reviewed following labs and imaging studies  CBC: Recent Labs  Lab 06/22/21 2317 06/23/21 1810 06/24/21 0346  WBC 4.4 6.6 4.6  NEUTROABS 2.1  --   --   HGB 13.9 13.3 12.0  HCT 41.8 39.9 36.9  MCV 99.3 100.3* 101.9*  PLT 238 233 [id number]   Basic Metabolic Panel: Recent Labs  Lab 06/22/21 2317 06/23/21 1810 06/24/21 0346  NA 141  --  138  K 3.5  --  3.2*  CL 102  --  103  CO2 26  --  27  GLUCOSE 86  --  177*  BUN 10  --  11   CREATININE 0.57 0.49 0.55  CALCIUM 8.8*  --  7.5*   GFR: Estimated Creatinine Clearance: 44.9 mL/min (by C-G formula based on SCr of 0.55 mg/dL). Liver Function Tests: No results for input(s): AST, ALT, ALKPHOS, BILITOT, PROT, ALBUMIN in the last 168 hours. No results for input(s): LIPASE, AMYLASE in the last 168 hours. No results for input(s): AMMONIA in the last 168 hours. Coagulation Profile: No results for input(s): INR, PROTIME in the last 168 hours. Cardiac Enzymes: Recent Labs  Lab 06/22/21 2317  CKTOTAL 389*   BNP (last 3 results) No results for input(s): PROBNP in the last 8760 hours. HbA1C: No results for input(s): HGBA1C in the last 72 hours. CBG: No results for input(s): GLUCAP in the last 168 hours. Lipid Profile: No results for input(s): CHOL, HDL, LDLCALC, TRIG, CHOLHDL, LDLDIRECT in the last 72 hours. Thyroid Function Tests: No results for input(s): TSH, T4TOTAL, FREET4, T3FREE, THYROIDAB in the last 72 hours. Anemia Panel: No results for input(s): VITAMINB12, FOLATE, FERRITIN, TIBC, IRON, RETICCTPCT in the last 72 hours. Sepsis Labs: No results for input(s): PROCALCITON, LATICACIDVEN in the last 168 hours.  Recent Results (from the past 240 hour(s))  Resp  Panel by RT-PCR (Flu A&B, Covid) Nasopharyngeal Swab     Status: None   Collection Time: 06/23/21  1:01 AM   Specimen: Nasopharyngeal Swab; Nasopharyngeal(NP) swabs in vial transport medium  Result Value Ref Range Status   SARS Coronavirus 2 by RT PCR NEGATIVE NEGATIVE Final    Comment: (NOTE) SARS-CoV-2 target nucleic acids are NOT DETECTED.  The SARS-CoV-2 RNA is generally detectable in upper respiratory specimens during the acute phase of infection. The lowest concentration of SARS-CoV-2 viral copies this assay can detect is 138 copies/mL. A negative result does not preclude SARS-Cov-2 infection and should not be used as the sole basis for treatment or other patient management decisions. A negative  result may occur with  improper specimen collection/handling, submission of specimen other than nasopharyngeal swab, presence of viral mutation(s) within the areas targeted by this assay, and inadequate number of viral copies(<138 copies/mL). A negative result must be combined with clinical observations, patient history, and epidemiological information. The expected result is Negative.  Fact Sheet for Patients:  EntrepreneurPulse.com.au  Fact Sheet for Healthcare Providers:  IncredibleEmployment.be  This test is no t yet approved or cleared by the [place] FDA and  has been authorized for detection and/or diagnosis of SARS-CoV-2 by FDA under an Emergency Use Authorization (EUA). This EUA will remain  in effect (meaning this test can be used) for the duration of the COVID-19 declaration under Section 564(b)(1) of the Act, 21 U.S.C.section 360bbb-3(b)(1), unless the authorization is terminated  or revoked sooner.       Influenza A by PCR NEGATIVE NEGATIVE Final   Influenza B by PCR NEGATIVE NEGATIVE Final    Comment: (NOTE) The Xpert Xpress SARS-CoV-2/FLU/RSV plus assay is intended as an aid in the diagnosis of influenza from Nasopharyngeal swab specimens and should not be used as a sole basis for treatment. Nasal washings and aspirates are unacceptable for Xpert Xpress SARS-CoV-2/FLU/RSV testing.  Fact Sheet for Patients: EntrepreneurPulse.com.au  Fact Sheet for Healthcare Providers: IncredibleEmployment.be  This test is not yet approved or cleared by the [place] FDA and has been authorized for detection and/or diagnosis of SARS-CoV-2 by FDA under an Emergency Use Authorization (EUA). This EUA will remain in effect (meaning this test can be used) for the duration of the COVID-19 declaration under Section 564(b)(1) of the Act, 21 U.S.C. section 360bbb-3(b)(1), unless the authorization is  terminated or revoked.  Performed at [facility], [address] [place], [place] [address]          Radiology Studies: DG C-Arm 1-60 Min  Result Date: 06/23/2021 CLINICAL DATA:  Elective surgery. Provided fluoroscopy time 4 minutes, 30 seconds (31.0 mGy). EXAM: DG C-ARM 1-60 MIN; LEFT FEMUR 2 VIEWS COMPARISON:  Radiographs of the right hip and knee 06/22/2021 FINDINGS: Four intraoperative fluoroscopic images of the left femur are submitted. On the provided images, there is evidence of interval ORIF of a known comminuted distal left femoral fracture utilizing a lateral plate and multiple interlocking screws. Persistent although improved displacement at the fracture sites. IMPRESSION: Four intraoperative fluoroscopic images of the left femur from ORIF, as described. Electronically Signed   By: Kellie Simmering D.O.   On: 06/23/2021 16:04   DG FEMUR MIN 2 VIEWS LEFT  Result Date: 06/23/2021 CLINICAL DATA:  Elective surgery. Provided fluoroscopy time 4 minutes, 30 seconds (31.0 mGy). EXAM: DG C-ARM 1-60 MIN; LEFT FEMUR 2 VIEWS COMPARISON:  Radiographs of the right hip and knee 06/22/2021 FINDINGS: Four intraoperative fluoroscopic images of the left  femur are submitted. On the provided images, there is evidence of interval ORIF of a known comminuted distal left femoral fracture utilizing a lateral plate and multiple interlocking screws. Persistent although improved displacement at the fracture sites. IMPRESSION: Four intraoperative fluoroscopic images of the left femur from ORIF, as described. Electronically Signed   By: Kellie Simmering D.O.   On: 06/23/2021 16:04        Scheduled Meds:  amLODipine  10 mg Oral Daily   vitamin C  500 mg Oral Daily   buPROPion  150 mg Oral Daily   busPIRone  10 mg Oral Daily   Chlorhexidine Gluconate Cloth  6 each Topical Daily   cholecalciferol  2,000 Units Oral Daily   docusate sodium  100 mg Oral BID   enoxaparin (LOVENOX) injection  40 mg  Subcutaneous Q24H   feeding supplement  237 mL Oral TID BM   ferrous sulfate  325 mg Oral Q breakfast   multivitamin-lutein  1 capsule Oral Daily   OLANZapine  7.5 mg Oral Daily   pantoprazole  40 mg Oral Daily   potassium chloride  10 mEq Oral Daily   simvastatin  20 mg Oral QHS   venlafaxine XR  75 mg Oral Q breakfast   vitamin B-12  3,000 mcg Oral Daily   Continuous Infusions:  sodium chloride Stopped (06/24/21 2137)   methocarbamol (ROBAXIN) IV      Assessment & Plan:   Principal Problem:   Closed comminuted intra-articular fracture of distal femur, left, initial encounter ([place]) Active Problems:   Anxiety   COPD (chronic obstructive pulmonary disease) (H[facility])   Essential hypertension   Accidental fall   Preoperative clearance   Recurrent falls   Closed fracture of multiple pubic rami, left, initial encounter [facility])   75 year old female with history of COPD, HTN, bipolar mood disorder, frequent falls, most recently 6/28 when she suffered a left acetabular and left humeral fracture, now ambulate with a cane who presents to the ED following a mechanical fall when she tripped over her cane.      Closed comminuted intra-articular fracture of distal femur, left, initial encounter ([place])   Closed fracture of multiple pubic rami, left, initial encounter    Accidental fall/recurrent falls status post open reduction internal fixation distal femur fracture-postop day 2 Ortho following Pain management Lovenox, foot pumps, TED hose for DVT prophylaxis 8/20 Lovenox 40 mEq subcu daily x14 days  Change dressing to honey, discharge   follow-up with [facility] in 2 weeks  PT recommends SNF          Anxiety/bipolar mood disorder Continue outpatient mood stabilizers        COPD (chronic obstructive pulmonary disease) ([place]) Stable without acute exacerbation  Continue inhalers as needed       Essential hypertension Stable continue amlodipine however you   DVT prophylaxis:  Lovenox Code Status: Full Family Communication: None at bedside Disposition Plan:  Status is: Inpatient  Remains inpatient appropriate because:Inpatient level of care appropriate due to severity of illness  Dispo: The patient is from: Home              Anticipated d/c is to: SNF              Patient currently is not medically stable to d/c.   Difficult to place patient No            LOS: 2 days   Time spent: 35 minutes with more than 50% COC  Nolberto Hanlon, MD [facility] Pager [phone]  If 7PM-7AM, please contact night-coverage 06/25/2021, 8:27 AM

## 2021-06-25 NOTE — Discharge Instructions (Addendum)
INSTRUCTIONS AFTER Surgery  Remove items at home which could result in a fall. This includes throw rugs or furniture in walking pathways ICE to the affected joint every three hours while awake for 30 minutes at a time, for at least the first 3-5 days, and then as needed for pain and swelling.  Continue to use ice for pain and swelling. You may notice swelling that will progress down to the foot and ankle.  This is normal after surgery.  Elevate your leg when you are not up walking on it.   Continue to use the breathing machine you got in the hospital (incentive spirometer) which will help keep your temperature down.  It is common for your temperature to cycle up and down following surgery, especially at night when you are not up moving around and exerting yourself.  The breathing machine keeps your lungs expanded and your temperature down.   DIET:  As you were doing prior to hospitalization, we recommend a well-balanced diet.  DRESSING / WOUND CARE / SHOWERING  Dressing change as needed.  No showering.  Staples will be removed in 2 weeks at [facility] clinic.  ACTIVITY  Increase activity slowly as tolerated, but follow the weight bearing instructions below.   No driving for 6 weeks or until further direction given by your physician.  You cannot drive while taking narcotics.  No lifting or carrying greater than 10 lbs. until further directed by your surgeon. Avoid periods of inactivity such as sitting longer than an hour when not asleep. This helps prevent blood clots.  You may return to work once you are authorized by your doctor.     WEIGHT BEARING  Partial weightbearing on the left with a walker.   EXERCISES Gait training and ambulation training with physical therapy.  Activities of daily living with occupational therapy.  Gentle range of motion activities.  CONSTIPATION  Constipation is defined medically as fewer than three stools per week and severe constipation as less than one  stool per week.  Even if you have a regular bowel pattern at home, your normal regimen is likely to be disrupted due to multiple reasons following surgery.  Combination of anesthesia, postoperative narcotics, change in appetite and fluid intake all can affect your bowels.   YOU MUST use at least one of the following options; they are listed in order of increasing strength to get the job done.  They are all available over the counter, and you may need to use some, POSSIBLY even all of these options:    Drink plenty of fluids (prune juice may be helpful) and high fiber foods Colace 100 mg by mouth twice a day  Senokot for constipation as directed and as needed Dulcolax (bisacodyl), take with full glass of water  Miralax (polyethylene glycol) once or twice a day as needed.  If you have tried all these things and are unable to have a bowel movement in the first 3-4 days after surgery call either your surgeon or your primary doctor.    If you experience loose stools or diarrhea, hold the medications until you stool forms back up.  If your symptoms do not get better within 1 week or if they get worse, check with your doctor.  If you experience "the worst abdominal pain ever" or develop nausea or vomiting, please contact the office immediately for further recommendations for treatment.   ITCHING:  If you experience itching with your medications, try taking only a single pain pill, or even  half a pain pill at a time.  You can also use Benadryl over the counter for itching or also to help with sleep.   TED HOSE STOCKINGS:  Use stockings on both legs until for at least 2 weeks or as directed by physician office. They may be removed at night for sleeping.  MEDICATIONS:  See your medication summary on the "After Visit Summary" that nursing will review with you.  You may have some home medications which will be placed on hold until you complete the course of blood thinner medication.  It is important for you to  complete the blood thinner medication as prescribed.  PRECAUTIONS:  If you experience chest pain or shortness of breath - call 911 immediately for transfer to the hospital emergency department.   If you develop a fever greater that 101 F, purulent drainage from wound, increased redness or drainage from wound, foul odor from the wound/dressing, or calf pain - CONTACT YOUR SURGEON.                                                   FOLLOW-UP APPOINTMENTS:  If you do not already have a post-op appointment, please call the office for an appointment to be seen by your surgeon.  Guidelines for how soon to be seen are listed in your "After Visit Summary", but are typically between 1-4 weeks after surgery.  OTHER INSTRUCTIONS:     MAKE SURE YOU:  Understand these instructions.  Get help right away if you are not doing well or get worse.    Thank you for letting us be a part of your medical care team.  It is a privilege we respect greatly.  We hope these instructions will help you stay on track for a fast and full recovery!

## 2021-06-25 NOTE — Progress Notes (Addendum)
Subjective: 2 Days Post-Op Procedure(s) (LRB): OPEN REDUCTION INTERNAL FIXATION (ORIF) DISTAL FEMUR FRACTURE (Left) Patient reports pain as mild.   Patient is well, and has had no acute complaints or problems Denies any CP, SOB, ABD pain. We will continue therapy today.   Objective: Vital signs in last 24 hours: Temp:  [98 F (36.7 C)-99.1 F (37.3 C)] 98.4 F (36.9 C) (08/20 0501) Pulse Rate:  [84-98] 98 (08/20 0501) Resp:  [16-20] 20 (08/20 0501) BP: (126-146)/(52-70) 133/70 (08/20 0501) SpO2:  [92 %-95 %] 93 % (08/20 0501)  Intake/Output from previous day: 08/19 0701 - 08/20 0700 In: 240 [P.O.:240] Out: -  Intake/Output this shift: No intake/output data recorded.  Recent Labs    06/22/21 2317 06/23/21 1810 06/24/21 0346  HGB 13.9 13.3 12.0   Recent Labs    06/23/21 1810 06/24/21 0346  WBC 6.6 4.6  RBC 3.98 3.62*  HCT 39.9 36.9  PLT 233 217   Recent Labs    06/22/21 2317 06/23/21 1810 06/24/21 0346  NA 141  --  138  K 3.5  --  3.2*  CL 102  --  103  CO2 26  --  27  BUN 10  --  11  CREATININE 0.57 0.49 0.55  GLUCOSE 86  --  177*  CALCIUM 8.8*  --  7.5*   No results for input(s): LABPT, INR in the last 72 hours.  EXAM General - Patient is Alert, Appropriate, and Oriented.  Mild agitation.  Able to communicate well. Extremity - Neurovascular intact Sensation intact distally Intact pulses distally Dorsiflexion/Plantar flexion intact No cellulitis present Compartment soft Dressing - dressing C/D/I and no drainage Motor Function - intact, moving foot and toes well on exam.   Past Medical History:  Diagnosis Date   Abdominal hernia with obstruction and without gangrene    Anxiety 02/15/2017   denies   Asthma    Bipolar 1 disorder, depressed, full remission ([place]) 02/15/2017   Bipolar affective disorder (HCC)    COPD (chronic obstructive pulmonary disease) (HCC)    Depression    GERD (gastroesophageal reflux disease) 02/15/2017   Glaucoma     Headache    Hiatal hernia    History of abdominal hernia 05/21/2017   History of compression fracture of spine 02/15/2017   Hyperlipidemia    Hypertension    Incisional hernia    Incisional ventral hernia w obstruction 06/02/2017   Normocytic anemia 05/23/2017   Osteoarthritis of knees, 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   Presbycusis of both ears 02/15/2017    Assessment/Plan:   2 Days Post-Op Procedure(s) (LRB): OPEN REDUCTION INTERNAL FIXATION (ORIF) DISTAL FEMUR FRACTURE (Left) Principal Problem:   Closed comminuted intra-articular fracture of distal femur, left, initial encounter (HCC) Active Problems:   Anxiety   COPD (chronic obstructive pulmonary disease) ([place])   Essential hypertension   Accidental fall   Preoperative clearance   Recurrent falls   Closed fracture of multiple pubic rami, left, initial encounter ([place])  Estimated body mass index is 25.66 kg/m as calculated from the following:   Height as of this encounter: '4\' 10"'$  (1.473 m).   Weight as of this encounter: 55.7 kg. Advance diet Up with therapy, 20% WB LLE Pain controlled VSS Labs stable CM to assist with discharge  Follow up with [facility] in 2 weeks Lovenox 40 mg SQ daily x 14days Change dressing to [place] at discharge    DVT Prophylaxis - Lovenox, Foot Pumps, and TED hose    Reche Dixon, PA-C  [place] 06/25/2021, 6:51 AM

## 2021-06-26 NOTE — TOC Initial Note (Addendum)
Transition of Care [facility]) - Initial/Assessment Note    Patient Details  Name: Pamela Ball MRN: [id number] Date of Birth: [birth year]  Transition of Care [facility]) CM/SW Contact:    Izola Price, RN Phone Number: 06/26/2021, 10:56 AM  Clinical Narrative:  Spoke with husband as patient was sleeping. PTA patient was getting around "pretty well" with a cane, but it also is what patient tripped on according to spouse.   Patient and spouse had discussed SNF places and prefers [facility] as patient had been there recently. Was at home with [facility] services at the time of the recent fall that brought patient to ED. Spouse was unsure of agency name.   He built a ramp for the 1-2 steps up to entry door and no steps in house. Spouse dives and can transport to appointment and to obtain food and medications.   No issues paying for medications. Spouse is primary caretaker and goal is to have patient return home with [facility]. Has some DME and is renting a wheelchair presently.   PCP is Dr. Vonita Moss. [facility] [employer].   Allowed time for questions and gave him call back number is spouse has further questions. Explained bed search process and that TOC/CM will continue to follow as patient progresses. Barbie Kimo Bancroft. RN CM             Expected Discharge Plan: [place]     Patient Goals and CMS Choice        Expected Discharge Plan and Services Expected Discharge Plan: [place]                                              Prior Living Arrangements/Services                       Activities of Daily Living Home Assistive Devices/Equipment: Kasandra Knudsen (specify quad or straight) ADL Screening (condition at time of admission) Patient's cognitive ability adequate to safely complete daily activities?: Yes Is the patient deaf or have difficulty hearing?: No Does the patient have difficulty seeing, even when wearing  glasses/contacts?: No Does the patient have difficulty concentrating, remembering, or making decisions?: No Patient able to express need for assistance with ADLs?: Yes Does the patient have difficulty dressing or bathing?: Yes Independently performs ADLs?: No Communication: Independent Dressing (OT): Needs assistance Is this a change from baseline?: Change from baseline, expected to last <3days Grooming: Independent Feeding: Independent Bathing: Needs assistance Is this a change from baseline?: Change from baseline, expected to last <3 days Toileting: Dependent Is this a change from baseline?: Change from baseline, expected to last <3 days In/Out Bed: Needs assistance Is this a change from baseline?: Change from baseline, expected to last <3 days Walks in Home: Independent with device (comment) (Cane) Does the patient have difficulty walking or climbing stairs?: Yes Weakness of Legs: Both Weakness of Arms/Hands: Left  Permission Sought/Granted                  Emotional Assessment              Admission diagnosis:  Closed displaced comminuted fracture of shaft of left femur, initial encounter ([place]) [S72.352A] Fall, initial encounter [W19.XXXA] Closed comminuted intra-articular fracture of distal femur, left, initial encounter ([place]) [S72.492A] Multiple closed fractures of pelvis  with stable disruption of pelvic ring, initial encounter [facility]) [S32.810A] Patient Active Problem List   Diagnosis Date Noted   Closed comminuted intra-articular fracture of distal femur, left, initial encounter ([place]) 06/23/2021   Closed fracture of multiple pubic rami, left, initial encounter ([place]) 06/23/2021   Accidental fall    Preoperative clearance    Recurrent falls    Macrocytosis 03/08/2021   Abnormal SPEP 03/08/2021   Rib pain on right side 08/06/2020   Essential hypertension 06/28/2020   Osteopenia of right foot 02/11/2019   Malnutrition of moderate degree 12/27/2018   COPD  (chronic obstructive pulmonary disease) ([place]) 12/26/2018   Lumbar hernia 11/19/2018   Elevated troponin 09/26/2018   Incisional hernia, without obstruction or gangrene 08/28/2018   Allergic rhinitis due to allergen 05/14/2018   Abdominal hernia with obstruction and without gangrene    Normocytic anemia 05/23/2017   History of abdominal hernia 05/21/2017   Bipolar 1 disorder, depressed, full remission ([place]) 02/15/2017   GERD (gastroesophageal reflux disease) 02/15/2017   Centrilobular emphysema ([place]) 02/15/2017   Osteoarthritis of knees, 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   Hyperlipidemia 02/15/2017   Presbycusis of both ears 02/15/2017   History of compression fracture of spine 02/15/2017   Anxiety 02/15/2017   PCP:  Olin Hauser, DO [id number] [place]: [phone]: [phone] [facility]  [address] [place]Phone: [phone]: [phone] [id number] [address]: [phone]: [phone]   Social Determinants of Health (SDOH) Interventions    Readmission Risk Interventions No flowsheet data found.

## 2021-06-26 NOTE — TOC Progression Note (Signed)
Transition of Care [facility]) - Progression Note    Patient Details  Name: Pamela Ball MRN: [id number] Date of Birth: 1946-05-21  Transition of Care [facility]) CM/SW Contact  Izola Price, RN Phone Number: 06/26/2021, 3:40 PM  Clinical Narrative:  [facility] preferred SNF after offering choices and speaking to husband. Patient was there in June 22. FL2/PASRR/Bed Search started. Simmie Davies RN CM     Expected Discharge Plan: [place]    Expected Discharge Plan and Services Expected Discharge Plan: [place]                                               Social Determinants of Health (SDOH) Interventions    Readmission Risk Interventions No flowsheet data found.

## 2021-06-26 NOTE — Progress Notes (Signed)
Physical Therapy Treatment Patient Details Name: Pamela Ball MRN: [id number] DOB: 25-Apr-1946 Today's Date: 06/26/2021    History of Present Illness Pt is a 75 y.o. female s/p mechanical fall (tripped on cane).  Per chart pt also had a fall May 03, 2021 with L acetabular fx and L humeral fx (L UE still NWB'ing); non-operative.  Imaging (from most recent fall) showing comminuted displaced and angulated fx of distal femur and displaced fx's of L superior and inferior pubic rami (likely acute).  S/p ORIF L distal femur fx 8/18 (L LE PWB'ing 20%).  PMH includes abdominal hernia, anxiety, bipolar affective disorder, COPD, htn, kyphoplasty, ORIF L distal humerus fx 08/07/2019, ventral hernia repair, and frequent falls.    PT Comments    Pt sleepy this am but was awake on 3rd attempt.  RN stated she did receive sleeping pill last night.  Participated in exercises as described below.  To EOB where pt puts in excellent effort to get to EOB with min/mod assist to complete.  Once sitting, steady without assist.  Seated A/AAROM x 10.  She needs cues to remain awake and seemed to drift off to sleep if left to sit.  Remained up for 10 minutes before assisting back to supine with mod a x 1.  Positioned for comfort.  Did endorse some dizziness in sitting that did not change with time.  Could be attributed to sleeping pills.   Follow Up Recommendations  SNF     Equipment Recommendations  Wheelchair (measurements PT);Wheelchair cushion (measurements PT);3in1 (PT) (hoyer lift)    Recommendations for Other Services OT consult     Precautions / Restrictions Precautions Precautions: Fall Required Braces or Orthoses: Other Brace Other Brace: Pt has shoulder sling for L UE that she has been wearing when mobilizing (per OP ortho note 06/09/21 pt does not need to wear her sling)--spouse brought sling into hospital Restrictions Weight Bearing Restrictions: Yes LUE Weight Bearing: Non weight bearing LLE Weight  Bearing: Partial weight bearing LLE Partial Weight Bearing Percentage or Pounds: 20% Other Position/Activity Restrictions: Per OP ortho note 06/09/21 regarding L UE (non-operative):"remain nonweightbearing to left upper extremity. She does not need to wear her sling at this time. Increased passive range of motion to left upper extremity and begin active assisted motion as tolerated to the left arm".    Mobility  Bed Mobility Overal bed mobility: Needs Assistance Bed Mobility: Supine to Sit;Sit to Supine     Supine to sit: Min assist;Mod assist Sit to supine: Min assist;Mod assist   General bed mobility comments: does quite well today with good effort and ability to assist.    Transfers Overall transfer level: Needs assistance Equipment used: None Transfers: Lateral/Scoot Transfers          Lateral/Scoot Transfers: Max assist General transfer comment: attempts to assist but remains difficulty  Ambulation/Gait             General Gait Details: not appropriate at this time   Stairs             Wheelchair Mobility    Modified Rankin (Stroke Patients Only)       Balance Overall balance assessment: Needs assistance Sitting-balance support: No upper extremity supported;Feet unsupported Sitting balance-Leahy Scale: Fair Sitting balance - Comments: steady static sitting       Standing balance comment: not appropriate for standing at this time  Cognition Arousal/Alertness: Awake/alert Behavior During Therapy: WFL for tasks assessed/performed Overall Cognitive Status: Difficult to assess                                        Exercises Total Joint Exercises Ankle Circles/Pumps: AROM;Strengthening;5 reps;10 reps;Supine Heel Slides: AAROM;Strengthening;Left;10 reps;Supine (decreased L knee flexion ROM per pt comfort) Hip ABduction/ADduction: AAROM;Strengthening;Left;10 reps;Supine Long Arc Quad:  AROM;Strengthening;Right;Seated (10 reps x2)    General Comments        Pertinent Vitals/Pain Pain Assessment: Faces Faces Pain Scale: Hurts little more Pain Location: LLE incision with movement Pain Descriptors / Indicators: Grimacing;Guarding Pain Intervention(s): Limited activity within patient's tolerance;Monitored during session;Premedicated before session;Repositioned    Home Living                      Prior Function            PT Goals (current goals can now be found in the care plan section) Progress towards PT goals: Progressing toward goals    Frequency    7X/week      PT Plan Current plan remains appropriate    Co-evaluation              AM-PAC PT "6 Clicks" Mobility   Outcome Measure  Help needed turning from your back to your side while in a flat bed without using bedrails?: A Lot Help needed moving from lying on your back to sitting on the side of a flat bed without using bedrails?: A Lot Help needed moving to and from a bed to a chair (including a wheelchair)?: Total Help needed standing up from a chair using your arms (e.g., wheelchair or bedside chair)?: Total Help needed to walk in hospital room?: Total Help needed climbing 3-5 steps with a railing? : Total 6 Click Score: 8    End of Session Equipment Utilized During Treatment: Gait belt Activity Tolerance: Patient tolerated treatment well Patient left: in bed;with call bell/phone within reach;with bed alarm set;Other (comment);with family/visitor present (B heels floating via pillow support) Nurse Communication: Mobility status;Precautions;Weight bearing status PT Visit Diagnosis: Other abnormalities of gait and mobility (R26.89);Muscle weakness (generalized) (M62.81);Repeated falls (R29.6);Pain Pain - Right/Left: Left Pain - part of body: Knee     Time: 1000-1026 PT Time Calculation (min) (ACUTE ONLY): 26 min  Charges:  $Therapeutic Exercise: 8-22 mins $Therapeutic  Activity: 8-22 mins                    Chesley Noon, PTA 06/26/21, 10:48 AM , 10:46 AM

## 2021-06-26 NOTE — Progress Notes (Signed)
PROGRESS NOTE    Pamela Ball  [id number] DOB: [birth year] DOA: 06/22/2021 PCP: Olin Hauser, DO    Brief Narrative:  Pamela Ball is a 75 y.o. female with medical history significant for COPD, HTN, bipolar mood disorder, frequent falls, most recently 6/28 when she suffered a left acetabular and left humeral fracture, now ambulate with a cane who presents to the ED following a mechanical fall when she tripped over her cane  8/20 no overnight issues 8/21 no overnight issues  Consultants:  Orthopedics  Procedures:   Antimicrobials:      Subjective: Denies pain, shortness of breath, chest pain  Objective: Vitals:   06/25/21 1955 06/26/21 0633 06/26/21 0748 06/26/21 0802  BP: (!) 138/55 (!) 156/64 140/65 (!) 143/51  Pulse: 93 97 94 91  Resp: '16 15 17 18  '$ Temp: 98.2 F (36.8 C) 97.8 F (36.6 C) 98.4 F (36.9 C) 98.7 F (37.1 C)  TempSrc: Oral  Oral   SpO2: 96% 93% 96% 96%  Weight:      Height:        Intake/Output Summary (Last 24 hours) at 06/26/2021 0848 Last data filed at 06/26/2021 [id number] Gross per 24 hour  Intake --  Output 400 ml  Net -400 ml   Filed Weights   06/23/21 1748  Weight: 55.7 kg    Examination: NAD, calm CTA no wheeze rales Regular S1-S2 no gallops Soft benign positive bowel sounds No edema of the right lower extremity, left lower extremity with edema dressing in place Mood and affect appropriate in current setting   Data Reviewed: I have personally reviewed following labs and imaging studies  CBC: Recent Labs  Lab 06/22/21 2317 06/23/21 1810 06/24/21 0346  WBC 4.4 6.6 4.6  NEUTROABS 2.1  --   --   HGB 13.9 13.3 12.0  HCT 41.8 39.9 36.9  MCV 99.3 100.3* 101.9*  PLT 238 233 [id number]   Basic Metabolic Panel: Recent Labs  Lab 06/22/21 2317 06/23/21 1810 06/24/21 0346  NA 141  --  138  K 3.5  --  3.2*  CL 102  --  103  CO2 26  --  27  GLUCOSE 86  --  177*  BUN 10  --  11  CREATININE 0.57 0.49 0.55   CALCIUM 8.8*  --  7.5*   GFR: Estimated Creatinine Clearance: 44.9 mL/min (by C-G formula based on SCr of 0.55 mg/dL). Liver Function Tests: No results for input(s): AST, ALT, ALKPHOS, BILITOT, PROT, ALBUMIN in the last 168 hours. No results for input(s): LIPASE, AMYLASE in the last 168 hours. No results for input(s): AMMONIA in the last 168 hours. Coagulation Profile: No results for input(s): INR, PROTIME in the last 168 hours. Cardiac Enzymes: Recent Labs  Lab 06/22/21 2317  CKTOTAL 389*   BNP (last 3 results) No results for input(s): PROBNP in the last 8760 hours. HbA1C: No results for input(s): HGBA1C in the last 72 hours. CBG: No results for input(s): GLUCAP in the last 168 hours. Lipid Profile: No results for input(s): CHOL, HDL, LDLCALC, TRIG, CHOLHDL, LDLDIRECT in the last 72 hours. Thyroid Function Tests: No results for input(s): TSH, T4TOTAL, FREET4, T3FREE, THYROIDAB in the last 72 hours. Anemia Panel: No results for input(s): VITAMINB12, FOLATE, FERRITIN, TIBC, IRON, RETICCTPCT in the last 72 hours. Sepsis Labs: No results for input(s): PROCALCITON, LATICACIDVEN in the last 168 hours.  Recent Results (from the past 240 hour(s))  Resp Panel by RT-PCR (Flu A&B, Covid) Nasopharyngeal Swab  Status: None   Collection Time: 06/23/21  1:01 AM   Specimen: Nasopharyngeal Swab; Nasopharyngeal(NP) swabs in vial transport medium  Result Value Ref Range Status   SARS Coronavirus 2 by RT PCR NEGATIVE NEGATIVE Final    Comment: (NOTE) SARS-CoV-2 target nucleic acids are NOT DETECTED.  The SARS-CoV-2 RNA is generally detectable in upper respiratory specimens during the acute phase of infection. The lowest concentration of SARS-CoV-2 viral copies this assay can detect is 138 copies/mL. A negative result does not preclude SARS-Cov-2 infection and should not be used as the sole basis for treatment or other patient management decisions. A negative result may occur with   improper specimen collection/handling, submission of specimen other than nasopharyngeal swab, presence of viral mutation(s) within the areas targeted by this assay, and inadequate number of viral copies(<138 copies/mL). A negative result must be combined with clinical observations, patient history, and epidemiological information. The expected result is Negative.  Fact Sheet for Patients:  EntrepreneurPulse.com.au  Fact Sheet for Healthcare Providers:  IncredibleEmployment.be  This test is no t yet approved or cleared by the [place] FDA and  has been authorized for detection and/or diagnosis of SARS-CoV-2 by FDA under an Emergency Use Authorization (EUA). This EUA will remain  in effect (meaning this test can be used) for the duration of the COVID-19 declaration under Section 564(b)(1) of the Act, 21 U.S.C.section 360bbb-3(b)(1), unless the authorization is terminated  or revoked sooner.       Influenza A by PCR NEGATIVE NEGATIVE Final   Influenza B by PCR NEGATIVE NEGATIVE Final    Comment: (NOTE) The Xpert Xpress SARS-CoV-2/FLU/RSV plus assay is intended as an aid in the diagnosis of influenza from Nasopharyngeal swab specimens and should not be used as a sole basis for treatment. Nasal washings and aspirates are unacceptable for Xpert Xpress SARS-CoV-2/FLU/RSV testing.  Fact Sheet for Patients: EntrepreneurPulse.com.au  Fact Sheet for Healthcare Providers: IncredibleEmployment.be  This test is not yet approved or cleared by the [place] FDA and has been authorized for detection and/or diagnosis of SARS-CoV-2 by FDA under an Emergency Use Authorization (EUA). This EUA will remain in effect (meaning this test can be used) for the duration of the COVID-19 declaration under Section 564(b)(1) of the Act, 21 U.S.C. section 360bbb-3(b)(1), unless the authorization is terminated  or revoked.  Performed at [facility] [address] [place], [place] [address]          Radiology Studies: No results found.      Scheduled Meds:  amLODipine  10 mg Oral Daily   vitamin C  500 mg Oral Daily   buPROPion  150 mg Oral Daily   busPIRone  10 mg Oral Daily   Chlorhexidine Gluconate Cloth  6 each Topical Daily   cholecalciferol  2,000 Units Oral Daily   docusate sodium  100 mg Oral BID   enoxaparin (LOVENOX) injection  40 mg Subcutaneous Q24H   feeding supplement  237 mL Oral TID BM   ferrous sulfate  325 mg Oral Q breakfast   multivitamin-lutein  1 capsule Oral Daily   OLANZapine  7.5 mg Oral Daily   pantoprazole  40 mg Oral Daily   potassium chloride  10 mEq Oral Daily   simvastatin  20 mg Oral QHS   venlafaxine XR  75 mg Oral Q breakfast   vitamin B-12  3,000 mcg Oral Daily   Continuous Infusions:  sodium chloride Stopped (06/24/21 2137)   methocarbamol (ROBAXIN) IV  Assessment & Plan:   Principal Problem:   Closed comminuted intra-articular fracture of distal femur, left, initial encounter ([place]) Active Problems:   Anxiety   COPD (chronic obstructive pulmonary disease) (HCC)   Essential hypertension   Accidental fall   Preoperative clearance   Recurrent falls   Closed fracture of multiple pubic rami, left, initial encounter [facility])   75 year old female with history of COPD, HTN, bipolar mood disorder, frequent falls, most recently 6/28 when she suffered a left acetabular and left humeral fracture, now ambulate with a cane who presents to the ED following a mechanical fall when she tripped over her cane.      Closed comminuted intra-articular fracture of distal femur, left, initial encounter ([place])   Closed fracture of multiple pubic rami, left, initial encounter    Accidental fall/recurrent falls status post open reduction internal fixation distal femur fracture-postop day 2 Ortho following Pain management Lovenox, foot  pumps, TED hose for DVT prophylaxis 8/21 Lovenox 40 mEq subcu daily x14 days  Change dressing troponin, discharge  Follow-up [facility] in 2 weeks  PT recommends SNF          Anxiety/bipolar mood disorder Continue outpatient mood stabilizer         COPD (chronic obstructive pulmonary disease) ([place]) Stable without acute exacerbation  8/21 continue inhalers as needed        Essential hypertension Stable, continue amlodipine   DVT prophylaxis: Lovenox Code Status: Full Family Communication: None at bedside Disposition Plan:  Status is: Inpatient  Remains inpatient appropriate because:Inpatient level of care appropriate due to severity of illness  Dispo: The patient is from: Home              Anticipated d/c is to: SNF              Patient currently is not medically stable to d/c.   Difficult to place patient No            LOS: 3 days   Time spent: 35 minutes with more than 50% COC    Nolberto Hanlon, MD [facility] Pager [phone]  If 7PM-7AM, please contact night-coverage 06/26/2021, 8:48 AM

## 2021-06-26 NOTE — NC FL2 (Signed)
[place] LEVEL OF CARE SCREENING TOOL     IDENTIFICATION  Patient Name: JORLEY DELCARMEN Birthdate: 1946-06-30 Sex: female Admission Date (Current Location): 06/22/2021  [facility] and [place] Number:  Engineering geologist and Address:  [facility], [address], [place], [place] [address]      Provider Number:    Attending Physician Name and Address:  Nolberto Hanlon, MD  Relative Name and Phone Number:  Blue, Dalby (Spouse)   [phone] [facility])    Current Level of Care: Hospital Recommended Level of Care: [place] Prior Approval Number:    Date Approved/Denied:   PASRR Number: [id number]  Discharge Plan: SNF    Current Diagnoses: Patient Active Problem List   Diagnosis Date Noted   Closed comminuted intra-articular fracture of distal femur, left, initial encounter ([place]) 06/23/2021   Closed fracture of multiple pubic rami, left, initial encounter ([place]) 06/23/2021   Accidental fall    Preoperative clearance    Recurrent falls    Macrocytosis 03/08/2021   Abnormal SPEP 03/08/2021   Rib pain on right side 08/06/2020   Essential hypertension 06/28/2020   Osteopenia of right foot 02/11/2019   Malnutrition of moderate degree 12/27/2018   COPD (chronic obstructive pulmonary disease) ([place]) 12/26/2018   Lumbar hernia 11/19/2018   Elevated troponin 09/26/2018   Incisional hernia, without obstruction or gangrene 08/28/2018   Allergic rhinitis due to allergen 05/14/2018   Abdominal hernia with obstruction and without gangrene    Normocytic anemia 05/23/2017   History of abdominal hernia 05/21/2017   Bipolar 1 disorder, depressed, full remission ([place]) 02/15/2017   GERD (gastroesophageal reflux disease) 02/15/2017   Centrilobular emphysema ([place]) 02/15/2017   Osteoarthritis of knees, 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   Hyperlipidemia 02/15/2017   Presbycusis of both ears 02/15/2017   History of compression fracture of spine  02/15/2017   Anxiety 02/15/2017    Orientation RESPIRATION BLADDER Height & Weight     Self, Time, Situation, Place  Normal Continent Weight: 55.7 kg Height:  '4\' 10"'$  (147.3 cm)  BEHAVIORAL SYMPTOMS/MOOD NEUROLOGICAL BOWEL NUTRITION STATUS      Continent Diet  AMBULATORY STATUS COMMUNICATION OF NEEDS Skin   Limited Assist Verbally Surgical wounds                       Personal Care Assistance Level of Assistance  Bathing, Dressing, Feeding (NON Weight Bearing LUE.) Bathing Assistance: Limited assistance Feeding assistance: Limited assistance (Due to LUE injury/surgery) Dressing Assistance: Limited assistance     Functional Limitations Info  Sight (Macular degeneration/legally blind) Sight Info: Impaired        SPECIAL CARE FACTORS FREQUENCY  PT (By licensed PT), OT (By licensed OT)     PT Frequency: 5x/week OT Frequency: 5x/week            Contractures Contractures Info: Not present    Additional Factors Info  Code Status, Allergies Code Status Info: Full Code Allergies Info: NKA           Current Medications (06/26/2021):  This is the current hospital active medication list Current Facility-Administered Medications  Medication Dose Route Frequency Provider Last Rate Last Admin   0.9 %  sodium chloride infusion   Intravenous Continuous Hessie Knows, MD   Stopped at 06/24/21 2137   albuterol (PROVENTIL) (2.5 MG/3ML) 0.083% nebulizer solution 3 mL  3 mL Nebulization Q4H PRN Hessie Knows, MD       alum & mag hydroxide-simeth (MAALOX/MYLANTA) 200-200-20 MG/5ML  suspension 30 mL  30 mL Oral Q4H PRN Hessie Knows, MD       amLODipine (NORVASC) tablet 10 mg  10 mg Oral Daily Hessie Knows, MD   10 mg at 06/26/21 1004   ascorbic acid (VITAMIN C) tablet 500 mg  500 mg Oral Daily Hessie Knows, MD   500 mg at 06/26/21 1004   bisacodyl (DULCOLAX) suppository 10 mg  10 mg Rectal Daily PRN Hessie Knows, MD       buPROPion (WELLBUTRIN XL) 24 hr tablet 150 mg  150 mg  Oral Daily Hessie Knows, MD   150 mg at 06/26/21 1003   busPIRone (BUSPAR) tablet 10 mg  10 mg Oral Daily Hessie Knows, MD   10 mg at 06/26/21 1004   Chlorhexidine Gluconate Cloth 2 % PADS 6 each  6 each Topical Daily Athena Masse, MD       cholecalciferol (VITAMIN D) tablet 2,000 Units  2,000 Units Oral Daily Hessie Knows, MD   2,000 Units at 06/26/21 1002   docusate sodium (COLACE) capsule 100 mg  100 mg Oral BID Hessie Knows, MD   100 mg at 06/26/21 1004   enoxaparin (LOVENOX) injection 40 mg  40 mg Subcutaneous Q24H Hessie Knows, MD   40 mg at 06/26/21 1005   feeding supplement (ENSURE ENLIVE / ENSURE PLUS) liquid 237 mL  237 mL Oral TID BM Nolberto Hanlon, MD   237 mL at 06/25/21 1950   ferrous sulfate tablet 325 mg  325 mg Oral Q breakfast Judd Gaudier V, MD   325 mg at 06/26/21 1003   magnesium hydroxide (MILK OF MAGNESIA) suspension 30 mL  30 mL Oral Daily PRN Hessie Knows, MD   30 mL at 06/25/21 1815   menthol-cetylpyridinium (CEPACOL) lozenge 3 mg  1 lozenge Oral PRN Hessie Knows, MD       Or   phenol (CHLORASEPTIC) mouth spray 1 spray  1 spray Mouth/Throat PRN Hessie Knows, MD   1 spray at 06/25/21 1713   methocarbamol (ROBAXIN) tablet 500 mg  500 mg Oral Q6H PRN Hessie Knows, MD   500 mg at 06/26/21 [id number]   Or   methocarbamol (ROBAXIN) 500 mg in dextrose 5 % 50 mL IVPB  500 mg Intravenous Q6H PRN Hessie Knows, MD       metoCLOPramide (REGLAN) tablet 5-10 mg  5-10 mg Oral Q8H PRN Hessie Knows, MD   10 mg at 06/26/21 0744   Or   metoCLOPramide (REGLAN) injection 5-10 mg  5-10 mg Intravenous Q8H PRN Hessie Knows, MD       morphine 2 MG/ML injection 2 mg  2 mg Intravenous Q2H PRN Hessie Knows, MD   2 mg at 06/24/21 2307   multivitamin-lutein (OCUVITE-LUTEIN) capsule 1 capsule  1 capsule Oral Daily Judd Gaudier V, MD   1 capsule at 06/26/21 1002   OLANZapine (ZYPREXA) tablet 7.5 mg  7.5 mg Oral Daily Hessie Knows, MD   7.5 mg at 06/26/21 1002   ondansetron (ZOFRAN) tablet 4  mg  4 mg Oral Q6H PRN Hessie Knows, MD       Or   ondansetron [facility]) injection 4 mg  4 mg Intravenous Q6H PRN Hessie Knows, MD   4 mg at 06/24/21 2307   oxyCODONE (Oxy IR/ROXICODONE) immediate release tablet 5 mg  5 mg Oral Q3H PRN Hessie Knows, MD   5 mg at 06/26/21 1004   pantoprazole (PROTONIX) EC tablet 40 mg  40 mg Oral Daily Hessie Knows, MD  40 mg at 06/26/21 1003   potassium chloride (KLOR-CON) CR tablet 10 mEq  10 mEq Oral Daily Hessie Knows, MD   10 mEq at 06/26/21 1004   senna-docusate (Senokot-S) tablet 1 tablet  1 tablet Oral QHS PRN Hessie Knows, MD       simvastatin (ZOCOR) tablet 20 mg  20 mg Oral QHS Hessie Knows, MD   20 mg at 06/25/21 2003   venlafaxine XR (EFFEXOR-XR) 24 hr capsule 75 mg  75 mg Oral Q breakfast Hessie Knows, MD   75 mg at 06/26/21 1002   vitamin B-12 (CYANOCOBALAMIN) tablet 3,000 mcg  3,000 mcg Oral Daily Berta Minor, RPH   3,000 mcg at 06/26/21 1004   zolpidem (AMBIEN) tablet 5 mg  5 mg Oral QHS PRN Hessie Knows, MD   5 mg at 06/25/21 2040     Discharge Medications: Please see discharge summary for a list of discharge medications.  Relevant Imaging Results:  Relevant Lab Results:   Additional Information NON Weight Bearing LUE.  Izola Price, RN

## 2021-06-26 NOTE — Progress Notes (Signed)
   Subjective: 3 Days Post-Op Procedure(s) (LRB): OPEN REDUCTION INTERNAL FIXATION (ORIF) DISTAL FEMUR FRACTURE (Left) Patient reports pain as mild.   Patient is well, and has had no acute complaints or problems Denies any CP, SOB, ABD pain. We will continue therapy today.   Objective: Vital signs in last 24 hours: Temp:  [97.8 F (36.6 C)-98.9 F (37.2 C)] 97.8 F (36.6 C) (08/21 [id number]) Pulse Rate:  [70-97] 97 (08/21 0633) Resp:  [15-17] 15 (08/21 0633) BP: (125-156)/(46-64) 156/64 (08/21 0633) SpO2:  [93 %-97 %] 93 % (08/21 [id number])  Intake/Output from previous day: 08/20 0701 - 08/21 0700 In: -  Out: 400 [Urine:400] Intake/Output this shift: Total I/O In: -  Out: 400 [Urine:400]  Recent Labs    06/23/21 1810 06/24/21 0346  HGB 13.3 12.0   Recent Labs    06/23/21 1810 06/24/21 0346  WBC 6.6 4.6  RBC 3.98 3.62*  HCT 39.9 36.9  PLT 233 217   Recent Labs    06/23/21 1810 06/24/21 0346  NA  --  138  K  --  3.2*  CL  --  103  CO2  --  27  BUN  --  11  CREATININE 0.49 0.55  GLUCOSE  --  177*  CALCIUM  --  7.5*   No results for input(s): LABPT, INR in the last 72 hours.  EXAM General - Patient is Alert, Appropriate, and Oriented.  improving agitation.  Able to communicate well. Extremity - Neurovascular intact Sensation intact distally Intact pulses distally Dorsiflexion/Plantar flexion intact No cellulitis present Compartment soft Dressing - dressing C/D/I and no drainage Motor Function - intact, moving foot and toes well on exam.   Past Medical History:  Diagnosis Date   Abdominal hernia with obstruction and without gangrene    Anxiety 02/15/2017   denies   Asthma    Bipolar 1 disorder, depressed, full remission ([place]) 02/15/2017   Bipolar affective disorder (HCC)    COPD (chronic obstructive pulmonary disease) (HCC)    Depression    GERD (gastroesophageal reflux disease) 02/15/2017   Glaucoma    Headache    Hiatal hernia    History of  abdominal hernia 05/21/2017   History of compression fracture of spine 02/15/2017   Hyperlipidemia    Hypertension    Incisional hernia    Incisional ventral hernia w obstruction 06/02/2017   Normocytic anemia 05/23/2017   Osteoarthritis of knees, 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   Presbycusis of both ears 02/15/2017    Assessment/Plan:   3 Days Post-Op Procedure(s) (LRB): OPEN REDUCTION INTERNAL FIXATION (ORIF) DISTAL FEMUR FRACTURE (Left) Principal Problem:   Closed comminuted intra-articular fracture of distal femur, left, initial encounter (HCC) Active Problems:   Anxiety   COPD (chronic obstructive pulmonary disease) ([place])   Essential hypertension   Accidental fall   Preoperative clearance   Recurrent falls   Closed fracture of multiple pubic rami, left, initial encounter ([place])  Estimated body mass index is 25.66 kg/m as calculated from the following:   Height as of this encounter: '4\' 10"'$  (1.473 m).   Weight as of this encounter: 55.7 kg. Advance diet Up with therapy, 20% WB LLE Pain controlled VSS Labs stable CM to assist with discharge  Follow up with [place] [facility] in 2 weeks Lovenox 40 mg SQ daily x 14days Change dressing to [place] at discharge    DVT Prophylaxis - Lovenox, Foot Pumps, and TED hose    Reche Dixon, PA-C [place] 06/26/2021, 6:40 AM

## 2021-06-27 DIAGNOSIS — R279 Unspecified lack of coordination: Secondary | ICD-10-CM | POA: Diagnosis not present

## 2021-06-27 DIAGNOSIS — M810 Age-related osteoporosis without current pathological fracture: Secondary | ICD-10-CM | POA: Diagnosis not present

## 2021-06-27 DIAGNOSIS — S32402A Unspecified fracture of left acetabulum, initial encounter for closed fracture: Secondary | ICD-10-CM | POA: Diagnosis not present

## 2021-06-27 DIAGNOSIS — M25552 Pain in left hip: Secondary | ICD-10-CM | POA: Diagnosis not present

## 2021-06-27 DIAGNOSIS — M170 Bilateral primary osteoarthritis of knee: Secondary | ICD-10-CM | POA: Diagnosis not present

## 2021-06-27 DIAGNOSIS — K219 Gastro-esophageal reflux disease without esophagitis: Secondary | ICD-10-CM | POA: Diagnosis not present

## 2021-06-27 DIAGNOSIS — S72492A Other fracture of lower end of left femur, initial encounter for closed fracture: Secondary | ICD-10-CM | POA: Diagnosis not present

## 2021-06-27 DIAGNOSIS — E440 Moderate protein-calorie malnutrition: Secondary | ICD-10-CM | POA: Diagnosis not present

## 2021-06-27 DIAGNOSIS — S42292D Other displaced fracture of upper end of left humerus, subsequent encounter for fracture with routine healing: Secondary | ICD-10-CM | POA: Diagnosis not present

## 2021-06-27 DIAGNOSIS — J449 Chronic obstructive pulmonary disease, unspecified: Secondary | ICD-10-CM | POA: Diagnosis not present

## 2021-06-27 DIAGNOSIS — I10 Essential (primary) hypertension: Secondary | ICD-10-CM | POA: Diagnosis not present

## 2021-06-27 DIAGNOSIS — R296 Repeated falls: Secondary | ICD-10-CM | POA: Diagnosis not present

## 2021-06-27 DIAGNOSIS — M6259 Muscle wasting and atrophy, not elsewhere classified, multiple sites: Secondary | ICD-10-CM | POA: Diagnosis not present

## 2021-06-27 DIAGNOSIS — W19XXXD Unspecified fall, subsequent encounter: Secondary | ICD-10-CM | POA: Diagnosis not present

## 2021-06-27 DIAGNOSIS — R5381 Other malaise: Secondary | ICD-10-CM | POA: Diagnosis not present

## 2021-06-27 DIAGNOSIS — E876 Hypokalemia: Secondary | ICD-10-CM | POA: Diagnosis not present

## 2021-06-27 DIAGNOSIS — S32512A Fracture of superior rim of left pubis, initial encounter for closed fracture: Secondary | ICD-10-CM | POA: Diagnosis not present

## 2021-06-27 DIAGNOSIS — K5900 Constipation, unspecified: Secondary | ICD-10-CM | POA: Diagnosis not present

## 2021-06-27 DIAGNOSIS — S72402A Unspecified fracture of lower end of left femur, initial encounter for closed fracture: Secondary | ICD-10-CM | POA: Diagnosis not present

## 2021-06-27 DIAGNOSIS — R2689 Other abnormalities of gait and mobility: Secondary | ICD-10-CM | POA: Diagnosis not present

## 2021-06-27 DIAGNOSIS — S72492D Other fracture of lower end of left femur, subsequent encounter for closed fracture with routine healing: Secondary | ICD-10-CM | POA: Diagnosis not present

## 2021-06-27 DIAGNOSIS — H9113 Presbycusis, bilateral: Secondary | ICD-10-CM | POA: Diagnosis not present

## 2021-06-27 DIAGNOSIS — M6281 Muscle weakness (generalized): Secondary | ICD-10-CM | POA: Diagnosis not present

## 2021-06-27 DIAGNOSIS — S728X2D Other fracture of left femur, subsequent encounter for closed fracture with routine healing: Secondary | ICD-10-CM | POA: Diagnosis not present

## 2021-06-27 LAB — RESP PANEL BY RT-PCR (FLU A&B, COVID) ARPGX2
Influenza A by PCR: NEGATIVE
Influenza B by PCR: NEGATIVE
SARS Coronavirus 2 by RT PCR: NEGATIVE

## 2021-06-27 LAB — CBC
HCT: 36.6 % (ref 36.0–46.0)
Hemoglobin: 11.6 g/dL — ABNORMAL LOW (ref 12.0–15.0)
MCH: 32.4 pg (ref 26.0–34.0)
MCHC: 31.7 g/dL (ref 30.0–36.0)
MCV: 102.2 fL — ABNORMAL HIGH (ref 80.0–100.0)
Platelets: 252 10*3/uL (ref 150–400)
RBC: 3.58 MIL/uL — ABNORMAL LOW (ref 3.87–5.11)
RDW: 12.9 % (ref 11.5–15.5)
WBC: 3.9 10*3/uL — ABNORMAL LOW (ref 4.0–10.5)
nRBC: 0 % (ref 0.0–0.2)

## 2021-06-27 LAB — POTASSIUM: Potassium: 4.7 mmol/L (ref 3.5–5.1)

## 2021-06-27 MED ORDER — CYANOCOBALAMIN 1000 MCG PO TABS: 3000.0000 ug | ORAL_TABLET | Freq: Every day | ORAL | Status: AC

## 2021-06-27 MED ORDER — BISACODYL 10 MG RE SUPP: 10.0000 mg | Freq: Every day | RECTAL | 0 refills | Status: DC | PRN

## 2021-06-27 MED ORDER — ENSURE ENLIVE PO LIQD: 237.0000 mL | Freq: Three times a day (TID) | ORAL | 12 refills | Status: AC

## 2021-06-27 NOTE — Discharge Summary (Signed)
Pamela Ball [id number] DOB: May 31, 1946 DOA: 06/22/2021  PCP: Olin Hauser, DO  Admit date: 06/22/2021 Discharge date: 06/27/2021  Admitted From: Home Disposition: SNF  Recommendations for Outpatient Follow-up:  Follow up with PCP in 1 week Please obtain BMP/CBC in one week Follow up with [facility] in 2 weeks     Discharge Condition:Stable CODE STATUS:FULL  Diet recommendation: Heart Healthy Brief/Interim Summary: Per [id number] H [name] is a 75 y.o. female with medical history significant for COPD, HTN, bipolar mood disorder, frequent falls, most recently 6/28 when she suffered a left acetabular and left humeral fracture, now ambulate with a cane who presents to the ED following a mechanical fall when she tripped over her cane.CT head and C-spine with no acute injury.  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  Blood work on arrival revealed CK of 389.  Patient was admitted.  Orthopedics surgery was consulted.   Closed comminuted intra-articular fracture of distal femur, left, initial encounter ([place])   Closed fracture of multiple pubic rami, left, initial encounter    Accidental fall/recurrent falls status post open reduction internal fixation distal femur fracture-postop day 2 Ortho following Pain management Lovenox, foot pumps, TED hose for DVT prophylaxis Lovenox 40 mEq subcu daily x14 days  Change dressing to [place] at discharge Follow-up [facility] in 2 weeks  PT recommends SNF         Anxiety/bipolar mood disorder Continue outpatient mood stabilizer           COPD (chronic obstructive pulmonary disease) ([place]) Stable without acute exacerbation   continue inhalers as needed         Essential hypertension Stable, continue amlodipine          Discharge Diagnoses:  Principal Problem:   Closed comminuted intra-articular fracture of distal femur, left, initial encounter ([place]) Active Problems:   Anxiety   COPD (chronic obstructive pulmonary disease) ([employer])   Essential hypertension   Accidental fall   Preoperative clearance   Recurrent falls   Closed fracture of multiple pubic rami, left, initial encounter [facility])    Discharge Instructions  Discharge Instructions     Call MD for:  temperature >100.4   Complete by: As directed    Diet - low sodium heart healthy   Complete by: As directed    Discharge wound care:   Complete by: As directed    Plz see   Increase activity slowly   Complete by: As directed       Allergies as of 06/27/2021   No Known Allergies      Medication List     STOP taking these medications    ibuprofen 600 MG tablet Commonly known as: ADVIL       TAKE these medications    Acidophilus Chew Chew by mouth.   albuterol 108 (90 Base) MCG/ACT inhaler Commonly known as: VENTOLIN HFA INHALE 2 PUFFS INTO THE LUNGS EVERY 4 HOURS AS NEEDED FOR WHEEZING OR SHORTNESS OF BREATH (COUGH) What changed: See the new instructions.   alendronate 70 MG tablet Commonly known as: FOSAMAX Take by mouth. Take 1 tablet (70 mg total) by mouth every 7 (seven) days Take with a full glass of water. Do not lie down for the next 30 min.   amLODipine 10 MG tablet Commonly known as:  NORVASC TAKE 1 TABLET BY MOUTH EVERY DAY   bisacodyl 10 MG suppository Commonly known as: DULCOLAX Place 1 suppository (10 mg total) rectally daily as needed for moderate constipation.   buPROPion 150 MG 24 hr tablet Commonly known as: WELLBUTRIN XL Take 150 mg by mouth daily.   busPIRone 10 MG tablet Commonly known as: BUSPAR Take 10 mg by mouth daily. Prescribed by Psychiatry Dr Kasandra Knudsen   CALCIUM + D PO Take 1 tablet by mouth daily.   docusate sodium 100 MG capsule Commonly known as: COLACE Take 1 capsule (100 mg  total) by mouth 2 (two) times daily.   enoxaparin 40 MG/0.4ML injection Commonly known as: LOVENOX Inject 0.4 mLs (40 mg total) into the skin daily for 14 days.   feeding supplement Liqd Take 237 mLs by mouth 3 (three) times daily between meals.   Iron 18 MG Tbcr Take 18 mg by mouth daily. W/Rice Protein   multivitamin with minerals Tabs tablet Take 1 tablet by mouth daily.   OLANZapine 7.5 MG tablet Commonly known as: ZYPREXA Take 7.5 mg by mouth daily.   omeprazole 20 MG capsule Commonly known as: PRILOSEC TAKE 1 CAPSULE (20 MG TOTAL) BY MOUTH DAILY BEFORE BREAKFAST.   ondansetron 4 MG disintegrating tablet Commonly known as: ZOFRAN-ODT TAKE 1 TABLET BY MOUTH EVERY 8 HOURS AS NEEDED FOR NAUSEA AND VOMITING   oxyCODONE 5 MG immediate release tablet Commonly known as: Oxy IR/ROXICODONE Take 1 tablet (5 mg total) by mouth every 3 (three) hours as needed for moderate pain. What changed:  when to take this reasons to take this   PreserVision AREDS Tabs Take 1 tablet by mouth daily.   simvastatin 20 MG tablet Commonly known as: ZOCOR TAKE 1 TABLET BY MOUTH EVERY DAY IN THE MORNING What changed: See the new instructions.   venlafaxine XR 75 MG 24 hr capsule Commonly known as: EFFEXOR-XR Take 75 mg by mouth daily with breakfast.   Vitamin B-12 3000 MCG Subl Take 3,000 mcg by mouth daily. What changed: Another medication with the same name was added. Make sure you understand how and when to take each.   cyanocobalamin 1000 MCG tablet Take 3 tablets (3,000 mcg total) by mouth daily. Start taking on: June 28, 2021 What changed: You were already taking a medication with the same name, and this prescription was added. Make sure you understand how and when to take each.   vitamin C 500 MG tablet Commonly known as: ASCORBIC ACID Take 500 mg by mouth daily.   Vitamin D3 50 MCG (2000 UT) Tabs Take 2,000 Units by mouth daily.               Discharge Care  Instructions  (From admission, onward)           Start     Ordered   06/27/21 0000  Discharge wound care:       Comments: Plz see   06/27/21 1305            Contact information for follow-up providers     Duanne Guess, PA-C Follow up in 2 week(s).   Specialties: Orthopedic Surgery, Emergency Medicine Contact information: [place] [address] [phone]         Olin Hauser, DO Follow up.   Specialty: Family Medicine Why: needs labs Contact information: [place] [address] [phone]              Contact information  for after-discharge care     Destination     [facility] .   Service: Skilled Nursing Contact information: [address] [phone]                    No Known Allergies  Consultations: Orthopedic surgery   Procedures/Studies: DG Tibia/Fibula Left  Result Date: 06/23/2021 CLINICAL DATA:  Fall and left lower extremity pain. EXAM: DG HIP (WITH OR WITHOUT PELVIS) 2-3V LEFT; LEFT KNEE - COMPLETE 4+ VIEW; LEFT TIBIA AND FIBULA - 2 VIEW COMPARISON:  None. FINDINGS: There is a comminuted, displaced, and mildly impacted fracture of the distal femur. There is dorsal angulation of the distal fracture fragment. There are displaced fractures of the left superior and inferior pubic rami, age indeterminate, likely acute. There is no dislocation. The bones are osteopenic. Lower lumbar vertebroplasty. Ventral hernia repair mesh. IMPRESSION: 1. Comminuted, displaced and angulated fracture of the distal femur. 2. Displaced fractures of the left superior and inferior pubic rami, likely acute. Electronically Signed   By: Anner Crete M.D.   On: 06/23/2021 00:19   CT HEAD WO CONTRAST (5MM)  Result Date: 06/22/2021 CLINICAL DATA:  Recent fall with headaches and neck pain, initial encounter EXAM: CT HEAD WITHOUT CONTRAST CT CERVICAL  SPINE WITHOUT CONTRAST TECHNIQUE: Multidetector CT imaging of the head and cervical spine was performed following the standard protocol without intravenous contrast. Multiplanar CT image reconstructions of the cervical spine were also generated. COMPARISON:  05/29/2020 FINDINGS: CT HEAD FINDINGS Brain: Mild atrophic changes are noted. 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 No acute hemorrhage or acute infarct is noted. Vascular: No hyperdense vessel or unexpected calcification. Skull: Normal. Negative for fracture or focal lesion. Sinuses/Orbits: Mucosal retention cyst is noted within sphenoid sinus Other: None CT CERVICAL SPINE FINDINGS Alignment: Within normal limits. Skull base and vertebrae: 7 cervical segments are well visualized. Vertebral body height is well maintained. Multi level disc space narrowing from C4-C7 is noted with osteophytic change. Facet hypertrophic changes are noted as well. No acute fracture or acute facet abnormality is noted. Soft tissues and spinal canal: Surrounding soft tissue structures are within normal limits. Upper chest: Visualized lung apices are within normal limits. Other: None IMPRESSION: CT of the head: Mild atrophic changes are noted. Lacunar infarct in the right basal ganglia unchanged from the prior exam CT of the cervical spine: Multilevel degenerative change without acute abnormality. Electronically Signed   By: Inez Catalina M.D.   On: 06/22/2021 23:58   CT Cervical Spine Wo Contrast  Result Date: 06/22/2021 CLINICAL DATA:  Recent fall with headaches and neck pain, initial encounter EXAM: CT HEAD WITHOUT CONTRAST CT CERVICAL SPINE WITHOUT CONTRAST TECHNIQUE: Multidetector CT imaging of the head and cervical spine was performed following the standard protocol without intravenous contrast. Multiplanar CT image reconstructions of the cervical spine were also generated. COMPARISON:  05/29/2020 FINDINGS: CT HEAD FINDINGS Brain: Mild atrophic  changes are noted. 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 No acute hemorrhage or acute infarct is noted. Vascular: No hyperdense vessel or unexpected calcification. Skull: Normal. Negative for fracture or focal lesion. Sinuses/Orbits: Mucosal retention cyst is noted within sphenoid sinus Other: None CT CERVICAL SPINE FINDINGS Alignment: Within normal limits. Skull base and vertebrae: 7 cervical segments are well visualized. Vertebral body height is well maintained. Multi level disc space narrowing from C4-C7 is noted with osteophytic change. Facet  hypertrophic changes are noted as well. No acute fracture or acute facet abnormality is noted. Soft tissues and spinal canal: Surrounding soft tissue structures are within normal limits. Upper chest: Visualized lung apices are within normal limits. Other: None IMPRESSION: CT of the head: Mild atrophic changes are noted. Lacunar infarct in the right basal ganglia unchanged from the prior exam CT of the cervical spine: Multilevel degenerative change without acute abnormality. Electronically Signed   By: Inez Catalina M.D.   On: 06/22/2021 23:58   DG Chest Portable 1 View  Result Date: 06/23/2021 CLINICAL DATA:  History of recent fall with known femoral fractures,, initial encounter EXAM: PORTABLE CHEST 1 VIEW COMPARISON:  12/26/2018 FINDINGS: Cardiac shadow is mildly prominent. Aortic calcifications are noted. The lungs are well aerated bilaterally. No focal infiltrate or effusion is seen. Chronic deformity of the proximal humeri are seen. IMPRESSION: Chronic changes without acute abnormality. Electronically Signed   By: Inez Catalina M.D.   On: 06/23/2021 00:16   DG Knee Complete 4 Views Left  Result Date: 06/23/2021 CLINICAL DATA:  Fall and left lower extremity pain. EXAM: DG HIP (WITH OR WITHOUT PELVIS) 2-3V LEFT; LEFT KNEE - COMPLETE 4+ VIEW; LEFT TIBIA AND FIBULA - 2 VIEW COMPARISON:  None. FINDINGS: There is a comminuted, displaced, and  mildly impacted fracture of the distal femur. There is dorsal angulation of the distal fracture fragment. There are displaced fractures of the left superior and inferior pubic rami, age indeterminate, likely acute. There is no dislocation. The bones are osteopenic. Lower lumbar vertebroplasty. Ventral hernia repair mesh. IMPRESSION: 1. Comminuted, displaced and angulated fracture of the distal femur. 2. Displaced fractures of the left superior and inferior pubic rami, likely acute. Electronically Signed   By: Anner Crete M.D.   On: 06/23/2021 00:19   DG C-Arm 1-60 Min  Result Date: 06/23/2021 CLINICAL DATA:  Elective surgery. Provided fluoroscopy time 4 minutes, 30 seconds (31.0 mGy). EXAM: DG C-ARM 1-60 MIN; LEFT FEMUR 2 VIEWS COMPARISON:  Radiographs of the right hip and knee 06/22/2021 FINDINGS: Four intraoperative fluoroscopic images of the left femur are submitted. On the provided images, there is evidence of interval ORIF of a known comminuted distal left femoral fracture utilizing a lateral plate and multiple interlocking screws. Persistent although improved displacement at the fracture sites. IMPRESSION: Four intraoperative fluoroscopic images of the left femur from ORIF, as described. Electronically Signed   By: Kellie Simmering D.O.   On: 06/23/2021 16:04   DG Hip Unilat W or Wo Pelvis 2-3 Views Left  Result Date: 06/23/2021 CLINICAL DATA:  Fall and left lower extremity pain. EXAM: DG HIP (WITH OR WITHOUT PELVIS) 2-3V LEFT; LEFT KNEE - COMPLETE 4+ VIEW; LEFT TIBIA AND FIBULA - 2 VIEW COMPARISON:  None. FINDINGS: There is a comminuted, displaced, and mildly impacted fracture of the distal femur. There is dorsal angulation of the distal fracture fragment. There are displaced fractures of the left superior and inferior pubic rami, age indeterminate, likely acute. There is no dislocation. The bones are osteopenic. Lower lumbar vertebroplasty. Ventral hernia repair mesh. IMPRESSION: 1. Comminuted,  displaced and angulated fracture of the distal femur. 2. Displaced fractures of the left superior and inferior pubic rami, likely acute. Electronically Signed   By: Anner Crete M.D.   On: 06/23/2021 00:19   DG FEMUR MIN 2 VIEWS LEFT  Result Date: 06/23/2021 CLINICAL DATA:  Elective surgery. Provided fluoroscopy time 4 minutes, 30 seconds (31.0 mGy). EXAM: DG C-ARM 1-60 MIN; LEFT FEMUR 2  VIEWS COMPARISON:  Radiographs of the right hip and knee 06/22/2021 FINDINGS: Four intraoperative fluoroscopic images of the left femur are submitted. On the provided images, there is evidence of interval ORIF of a known comminuted distal left femoral fracture utilizing a lateral plate and multiple interlocking screws. Persistent although improved displacement at the fracture sites. IMPRESSION: Four intraoperative fluoroscopic images of the left femur from ORIF, as described. Electronically Signed   By: Kellie Simmering D.O.   On: 06/23/2021 16:04      Subjective: No pain.  No shortness of breath or chest pain.  Discharge Exam: Vitals:   06/27/21 0816 06/27/21 1000  BP: (!) 120/48   Pulse: 83   Resp: 15   Temp: 97.6 F (36.4 C)   SpO2: 94% 95%   Vitals:   06/26/21 2109 06/27/21 0409 06/27/21 0816 06/27/21 1000  BP: 136/60 128/61 (!) 120/48   Pulse: 95 81 83   Resp: '16 17 15   '$ Temp: 98.1 F (36.7 C) 98.6 F (37 C) 97.6 F (36.4 C)   TempSrc:      SpO2: 91% 91% 94% 95%  Weight:      Height:        General: Pt is alert, awake, not in acute distress Cardiovascular: RRR, S1/S2 +, no rubs, no gallops Respiratory: CTA bilaterally, no wheezing, no rhonchi Abdominal: Soft, NT, ND, bowel sounds + Extremities: Left lower extremity around thigh area with swelling and ecchymosis dressing in place.  Right lower extremity no edema.    The results of significant diagnostics from this hospitalization (including imaging, microbiology, ancillary and laboratory) are listed below for reference.      Microbiology: Recent Results (from the past 240 hour(s))  Resp Panel by RT-PCR (Flu A&B, Covid) Nasopharyngeal Swab     Status: None   Collection Time: 06/23/21  1:01 AM   Specimen: Nasopharyngeal Swab; Nasopharyngeal(NP) swabs in vial transport medium  Result Value Ref Range Status   SARS Coronavirus 2 by RT PCR NEGATIVE NEGATIVE Final    Comment: (NOTE) SARS-CoV-2 target nucleic acids are NOT DETECTED.  The SARS-CoV-2 RNA is generally detectable in upper respiratory specimens during the acute phase of infection. The lowest concentration of SARS-CoV-2 viral copies this assay can detect is 138 copies/mL. A negative result does not preclude SARS-Cov-2 infection and should not be used as the sole basis for treatment or other patient management decisions. A negative result may occur with  improper specimen collection/handling, submission of specimen other than nasopharyngeal swab, presence of viral mutation(s) within the areas targeted by this assay, and inadequate number of viral copies(<138 copies/mL). A negative result must be combined with clinical observations, patient history, and epidemiological information. The expected result is Negative.  Fact Sheet for Patients:  EntrepreneurPulse.com.au  Fact Sheet for Healthcare Providers:  IncredibleEmployment.be  This test is no t yet approved or cleared by the [place] FDA and  has been authorized for detection and/or diagnosis of SARS-CoV-2 by FDA under an Emergency Use Authorization (EUA). This EUA will remain  in effect (meaning this test can be used) for the duration of the COVID-19 declaration under Section 564(b)(1) of the Act, 21 U.S.C.section 360bbb-3(b)(1), unless the authorization is terminated  or revoked sooner.       Influenza A by PCR NEGATIVE NEGATIVE Final   Influenza B by PCR NEGATIVE NEGATIVE Final    Comment: (NOTE) The Xpert Xpress SARS-CoV-2/FLU/RSV plus assay is  intended as an aid in the diagnosis of influenza from Nasopharyngeal swab  specimens and should not be used as a sole basis for treatment. Nasal washings and aspirates are unacceptable for Xpert Xpress SARS-CoV-2/FLU/RSV testing.  Fact Sheet for Patients: EntrepreneurPulse.com.au  Fact Sheet for Healthcare Providers: IncredibleEmployment.be  This test is not yet approved or cleared by the [place] FDA and has been authorized for detection and/or diagnosis of SARS-CoV-2 by FDA under an Emergency Use Authorization (EUA). This EUA will remain in effect (meaning this test can be used) for the duration of the COVID-19 declaration under Section 564(b)(1) of the Act, 21 U.S.C. section 360bbb-3(b)(1), unless the authorization is terminated or revoked.  Performed at [facility], [address] [place], [place] [address]   Resp Panel by RT-PCR (Flu A&B, Covid) Nasopharyngeal Swab     Status: None   Collection Time: 06/27/21 10:30 AM   Specimen: Nasopharyngeal Swab; Nasopharyngeal(NP) swabs in vial transport medium  Result Value Ref Range Status   SARS Coronavirus 2 by RT PCR NEGATIVE NEGATIVE Final    Comment: (NOTE) SARS-CoV-2 target nucleic acids are NOT DETECTED.  The SARS-CoV-2 RNA is generally detectable in upper respiratory specimens during the acute phase of infection. The lowest concentration of SARS-CoV-2 viral copies this assay can detect is 138 copies/mL. A negative result does not preclude SARS-Cov-2 infection and should not be used as the sole basis for treatment or other patient management decisions. A negative result may occur with  improper specimen collection/handling, submission of specimen other than nasopharyngeal swab, presence of viral mutation(s) within the areas targeted by this assay, and inadequate number of viral copies(<138 copies/mL). A negative result must be combined with clinical observations, patient  history, and epidemiological information. The expected result is Negative.  Fact Sheet for Patients:  EntrepreneurPulse.com.au  Fact Sheet for Healthcare Providers:  IncredibleEmployment.be  This test is no t yet approved or cleared by the [place] FDA and  has been authorized for detection and/or diagnosis of SARS-CoV-2 by FDA under an Emergency Use Authorization (EUA). This EUA will remain  in effect (meaning this test can be used) for the duration of the COVID-19 declaration under Section 564(b)(1) of the Act, 21 U.S.C.section 360bbb-3(b)(1), unless the authorization is terminated  or revoked sooner.       Influenza A by PCR NEGATIVE NEGATIVE Final   Influenza B by PCR NEGATIVE NEGATIVE Final    Comment: (NOTE) The Xpert Xpress SARS-CoV-2/FLU/RSV plus assay is intended as an aid in the diagnosis of influenza from Nasopharyngeal swab specimens and should not be used as a sole basis for treatment. Nasal washings and aspirates are unacceptable for Xpert Xpress SARS-CoV-2/FLU/RSV testing.  Fact Sheet for Patients: EntrepreneurPulse.com.au  Fact Sheet for Healthcare Providers: IncredibleEmployment.be  This test is not yet approved or cleared by the [place] FDA and has been authorized for detection and/or diagnosis of SARS-CoV-2 by FDA under an Emergency Use Authorization (EUA). This EUA will remain in effect (meaning this test can be used) for the duration of the COVID-19 declaration under Section 564(b)(1) of the Act, 21 U.S.C. section 360bbb-3(b)(1), unless the authorization is terminated or revoked.  Performed at [facility], [address] [place], [place] [address]      Labs: BNP (last 3 results) No results for input(s): BNP in the last 8760 hours. Basic Metabolic Panel: Recent Labs  Lab 06/22/21 2317 06/23/21 1810 06/24/21 0346 06/27/21 1149  NA 141  --  138  --    K 3.5  --  3.2* 4.7  CL 102  --  103  --   CO2 26  --  27  --   GLUCOSE 86  --  177*  --   BUN 10  --  11  --   CREATININE 0.57 0.49 0.55  --   CALCIUM 8.8*  --  7.5*  --    Liver Function Tests: No results for input(s): AST, ALT, ALKPHOS, BILITOT, PROT, ALBUMIN in the last 168 hours. No results for input(s): LIPASE, AMYLASE in the last 168 hours. No results for input(s): AMMONIA in the last 168 hours. CBC: Recent Labs  Lab 06/22/21 2317 06/23/21 1810 06/24/21 0346 06/27/21 1149  WBC 4.4 6.6 4.6 3.9*  NEUTROABS 2.1  --   --   --   HGB 13.9 13.3 12.0 11.6*  HCT 41.8 39.9 36.9 36.6  MCV 99.3 100.3* 101.9* 102.2*  PLT 238 233 217 252   Cardiac Enzymes: Recent Labs  Lab 06/22/21 2317  CKTOTAL 389*   BNP: Invalid input(s): POCBNP CBG: No results for input(s): GLUCAP in the last 168 hours. D-Dimer No results for input(s): DDIMER in the last 72 hours. Hgb A1c No results for input(s): HGBA1C in the last 72 hours. Lipid Profile No results for input(s): CHOL, HDL, LDLCALC, TRIG, CHOLHDL, LDLDIRECT in the last 72 hours. Thyroid function studies No results for input(s): TSH, T4TOTAL, T3FREE, THYROIDAB in the last 72 hours.  Invalid input(s): FREET3 Anemia work up No results for input(s): VITAMINB12, FOLATE, FERRITIN, TIBC, IRON, RETICCTPCT in the last 72 hours. Urinalysis    Component Value Date/Time   COLORURINE YELLOW (A) 05/29/2020 2203   APPEARANCEUR HAZY (A) 05/29/2020 2203   APPEARANCEUR Clear 02/08/2015 1659   LABSPEC 1.018 05/29/2020 2203   LABSPEC 1.010 02/08/2015 1659   PHURINE 5.0 05/29/2020 2203   GLUCOSEU NEGATIVE 05/29/2020 2203   GLUCOSEU Negative 02/08/2015 1659   HGBUR NEGATIVE 05/29/2020 2203   BILIRUBINUR Negative 02/09/2021 1139   BILIRUBINUR Negative 02/08/2015 1659   KETONESUR 20 (A) 05/29/2020 2203   PROTEINUR Negative 02/09/2021 1139   PROTEINUR NEGATIVE 05/29/2020 2203   UROBILINOGEN 0.2 02/09/2021 1139   NITRITE Positive 02/09/2021  1139   NITRITE NEGATIVE 05/29/2020 2203   LEUKOCYTESUR Small (1+) (A) 02/09/2021 1139   LEUKOCYTESUR TRACE (A) 05/29/2020 2203   LEUKOCYTESUR Negative 02/08/2015 1659   Sepsis Labs Invalid input(s): PROCALCITONIN,  WBC,  LACTICIDVEN Microbiology Recent Results (from the past 240 hour(s))  Resp Panel by RT-PCR (Flu A&B, Covid) Nasopharyngeal Swab     Status: None   Collection Time: 06/23/21  1:01 AM   Specimen: Nasopharyngeal Swab; Nasopharyngeal(NP) swabs in vial transport medium  Result Value Ref Range Status   SARS Coronavirus 2 by RT PCR NEGATIVE NEGATIVE Final    Comment: (NOTE) SARS-CoV-2 target nucleic acids are NOT DETECTED.  The SARS-CoV-2 RNA is generally detectable in upper respiratory specimens during the acute phase of infection. The lowest concentration of SARS-CoV-2 viral copies this assay can detect is 138 copies/mL. A negative result does not preclude SARS-Cov-2 infection and should not be used as the sole basis for treatment or other patient management decisions. A negative result may occur with  improper specimen collection/handling, submission of specimen other than nasopharyngeal swab, presence of viral mutation(s) within the areas targeted by this assay, and inadequate number of viral copies(<138 copies/mL). A negative result must be combined with clinical observations, patient history, and epidemiological information. The expected result is Negative.  Fact Sheet for Patients:  EntrepreneurPulse.com.au  Fact Sheet for Healthcare Providers:  IncredibleEmployment.be  This test  is no t yet approved or cleared by the [place] and  has been authorized for detection and/or diagnosis of SARS-CoV-2 by FDA under an Emergency Use Authorization (EUA). This EUA will remain  in effect (meaning this test can be used) for the duration of the COVID-19 declaration under Section 564(b)(1) of the Act, 21 U.S.C.section  360bbb-3(b)(1), unless the authorization is terminated  or revoked sooner.       Influenza A by PCR NEGATIVE NEGATIVE Final   Influenza B by PCR NEGATIVE NEGATIVE Final    Comment: (NOTE) The Xpert Xpress SARS-CoV-2/FLU/RSV plus assay is intended as an aid in the diagnosis of influenza from Nasopharyngeal swab specimens and should not be used as a sole basis for treatment. Nasal washings and aspirates are unacceptable for Xpert Xpress SARS-CoV-2/FLU/RSV testing.  Fact Sheet for Patients: EntrepreneurPulse.com.au  Fact Sheet for Healthcare Providers: IncredibleEmployment.be  This test is not yet approved or cleared by the [place] FDA and has been authorized for detection and/or diagnosis of SARS-CoV-2 by FDA under an Emergency Use Authorization (EUA). This EUA will remain in effect (meaning this test can be used) for the duration of the COVID-19 declaration under Section 564(b)(1) of the Act, 21 U.S.C. section 360bbb-3(b)(1), unless the authorization is terminated or revoked.  Performed at [facility], [address] [place], [place] [address]   Resp Panel by RT-PCR (Flu A&B, Covid) Nasopharyngeal Swab     Status: None   Collection Time: 06/27/21 10:30 AM   Specimen: Nasopharyngeal Swab; Nasopharyngeal(NP) swabs in vial transport medium  Result Value Ref Range Status   SARS Coronavirus 2 by RT PCR NEGATIVE NEGATIVE Final    Comment: (NOTE) SARS-CoV-2 target nucleic acids are NOT DETECTED.  The SARS-CoV-2 RNA is generally detectable in upper respiratory specimens during the acute phase of infection. The lowest concentration of SARS-CoV-2 viral copies this assay can detect is 138 copies/mL. A negative result does not preclude SARS-Cov-2 infection and should not be used as the sole basis for treatment or other patient management decisions. A negative result may occur with  improper specimen collection/handling, submission  of specimen other than nasopharyngeal swab, presence of viral mutation(s) within the areas targeted by this assay, and inadequate number of viral copies(<138 copies/mL). A negative result must be combined with clinical observations, patient history, and epidemiological information. The expected result is Negative.  Fact Sheet for Patients:  EntrepreneurPulse.com.au  Fact Sheet for Healthcare Providers:  IncredibleEmployment.be  This test is no t yet approved or cleared by the [place] FDA and  has been authorized for detection and/or diagnosis of SARS-CoV-2 by FDA under an Emergency Use Authorization (EUA). This EUA will remain  in effect (meaning this test can be used) for the duration of the COVID-19 declaration under Section 564(b)(1) of the Act, 21 U.S.C.section 360bbb-3(b)(1), unless the authorization is terminated  or revoked sooner.       Influenza A by PCR NEGATIVE NEGATIVE Final   Influenza B by PCR NEGATIVE NEGATIVE Final    Comment: (NOTE) The Xpert Xpress SARS-CoV-2/FLU/RSV plus assay is intended as an aid in the diagnosis of influenza from Nasopharyngeal swab specimens and should not be used as a sole basis for treatment. Nasal washings and aspirates are unacceptable for Xpert Xpress SARS-CoV-2/FLU/RSV testing.  Fact Sheet for Patients: EntrepreneurPulse.com.au  Fact Sheet for Healthcare Providers: IncredibleEmployment.be  This test is not yet approved or cleared by the [place] FDA and has been authorized for detection and/or diagnosis of SARS-CoV-2  by FDA under an Emergency Use Authorization (EUA). This EUA will remain in effect (meaning this test can be used) for the duration of the COVID-19 declaration under Section 564(b)(1) of the Act, 21 U.S.C. section 360bbb-3(b)(1), unless the authorization is terminated or revoked.  Performed at [facility], [address], [place], [place] [address]      Time coordinating discharge: Over 30 minutes  SIGNED:   Nolberto Hanlon, MD  [facility] 06/27/2021, 1:22 PM Pager   If 7PM-7AM, please contact night-coverage [email or web address] Password [email or web address]

## 2021-06-27 NOTE — Progress Notes (Signed)
   Subjective: 4 Days Post-Op Procedure(s) (LRB): OPEN REDUCTION INTERNAL FIXATION (ORIF) DISTAL FEMUR FRACTURE (Left) Patient reports pain as mild.   Patient is well, and has had no acute complaints or problems Denies any CP, SOB, ABD pain. We will continue with therapy today.   Objective: Vital signs in last 24 hours: Temp:  [97.6 F (36.4 C)-98.6 F (37 C)] 97.6 F (36.4 C) (08/22 0816) Pulse Rate:  [81-95] 83 (08/22 0816) Resp:  [15-18] 15 (08/22 0816) BP: (110-136)/(48-61) 120/48 (08/22 0816) SpO2:  [91 %-95 %] 95 % (08/22 1000)  Intake/Output from previous day: 08/21 0701 - 08/22 0700 In: 120 [P.O.:120] Out: 1100 [Urine:1100] Intake/Output this shift: Total I/O In: 120 [P.O.:120] Out: -   No results for input(s): HGB in the last 72 hours.  No results for input(s): WBC, RBC, HCT, PLT in the last 72 hours.  No results for input(s): NA, K, CL, CO2, BUN, CREATININE, GLUCOSE, CALCIUM in the last 72 hours.  No results for input(s): LABPT, INR in the last 72 hours.  EXAM General - Patient is Alert, Appropriate, and Oriented Extremity - Neurovascular intact Sensation intact distally Intact pulses distally Dorsiflexion/Plantar flexion intact No cellulitis present Compartment soft Dressing - dressing C/D/I and no drainage Motor Function - intact, moving foot and toes well on exam.   Past Medical History:  Diagnosis Date   Abdominal hernia with obstruction and without gangrene    Anxiety 02/15/2017   denies   Asthma    Bipolar 1 disorder, depressed, full remission ([place]) 02/15/2017   Bipolar affective disorder (HCC)    COPD (chronic obstructive pulmonary disease) (HCC)    Depression    GERD (gastroesophageal reflux disease) 02/15/2017   Glaucoma    Headache    Hiatal hernia    History of abdominal hernia 05/21/2017   History of compression fracture of spine 02/15/2017   Hyperlipidemia    Hypertension    Incisional hernia    Incisional ventral hernia w  obstruction 06/02/2017   Normocytic anemia 05/23/2017   Osteoarthritis of knees, 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   Presbycusis of both ears 02/15/2017    Assessment/Plan:   4 Days Post-Op Procedure(s) (LRB): OPEN REDUCTION INTERNAL FIXATION (ORIF) DISTAL FEMUR FRACTURE (Left) Principal Problem:   Closed comminuted intra-articular fracture of distal femur, left, initial encounter (HCC) Active Problems:   Anxiety   COPD (chronic obstructive pulmonary disease) ([place])   Essential hypertension   Accidental fall   Preoperative clearance   Recurrent falls   Closed fracture of multiple pubic rami, left, initial encounter ([place])  Estimated body mass index is 25.66 kg/m as calculated from the following:   Height as of this encounter: '4\' 10"'$  (1.473 m).   Weight as of this encounter: 55.7 kg. Advance diet Up with therapy, 20% WB LLE Pain controlled VSS CM to assist with discharge to SNF today pending insurance authorization  Follow up with [facility] in 2 weeks Lovenox 40 mg SQ daily x 14days      DVT Prophylaxis - Lovenox, Foot Pumps, and TED hose    T. Rachelle Hora, PA-C [place] 06/27/2021, 12:07 PM

## 2021-06-27 NOTE — TOC Progression Note (Addendum)
Transition of Care [facility]) - Progression Note    Patient Details  Name: Pamela Ball MRN: [id number] Date of Birth: [birth year]  Transition [facility] Care [facility]) CM/SW [place], RN Phone Number: 06/27/2021, 1:27 PM  Clinical Narrative:     Met with the patient's husband in the room and explained that she only has 4 days left at no copay and then the copay is 188.00 per day, he stated understanding, he stated he is unable to transport the patient in her current condition and will need non urgent EMS, [facility] to pick up at 530  Expected Discharge Plan: [place]    Expected Discharge Plan and Services Expected Discharge Plan: [place]         Expected Discharge Date: 06/27/21                                     Social Determinants of Health (SDOH) Interventions    Readmission Risk Interventions No flowsheet data found.

## 2021-06-27 NOTE — Progress Notes (Signed)
Physical Therapy Treatment Patient Details Name: Pamela Ball MRN: [id number] DOB: Nov 29, 1945 Today's Date: 06/27/2021    History of Present Illness Pt is a 75 y.o. female s/p mechanical fall (tripped on cane).  Per chart pt also had a fall May 03, 2021 with L acetabular fx and L humeral fx (L UE still NWB'ing); non-operative.  Imaging (from most recent fall) showing comminuted displaced and angulated fx of distal femur and displaced fx's of L superior and inferior pubic rami (likely acute).  S/p ORIF L distal femur fx 8/18 (L LE PWB'ing 20%).  PMH includes abdominal hernia, anxiety, bipolar affective disorder, COPD, htn, kyphoplasty, ORIF L distal humerus fx 08/07/2019, ventral hernia repair, and frequent falls.    PT Comments    Ready for session.  To EOB with mod a x 1 to get fully upright.  She tolerated sitting x 10 minutes with supervision for LLE AROM and grooming (brushing hair).  She asked to lay back down due to discomfort and was assisted to supine with max a x 1 and repositioned.  Sling on for session and removed after.  Lateral scooting remains difficulty.   Follow Up Recommendations  SNF     Equipment Recommendations  Wheelchair (measurements PT);Wheelchair cushion (measurements PT);3in1 (PT) (hoyer lift)    Recommendations for Other Services OT consult     Precautions / Restrictions Precautions Precautions: Fall Precaution Comments: Per MD Rudene Christians 8/19 regarding L LE: no limit on L knee ROM except pain; no brace Other Brace: Pt has shoulder sling for L UE that she has been wearing when mobilizing (per OP ortho note 06/09/21 pt does not need to wear her sling)--spouse brought sling into hospital Restrictions Weight Bearing Restrictions: Yes LUE Weight Bearing: Weight bearing as tolerated LLE Weight Bearing: Partial weight bearing LLE Partial Weight Bearing Percentage or Pounds: 20% Other Position/Activity Restrictions: Per OP ortho note 06/09/21 regarding L UE  (non-operative):"remain nonweightbearing to left upper extremity. She does not need to wear her sling at this time. Increased passive range of motion to left upper extremity and begin active assisted motion as tolerated to the left arm".    Mobility  Bed Mobility Overal bed mobility: Needs Assistance Bed Mobility: Supine to Sit;Sit to Supine     Supine to sit: Mod assist Sit to supine: Mod assist;Max assist   General bed mobility comments: needed increased assist today which she attributed to pain    Transfers Overall transfer level: Needs assistance Equipment used: None            Lateral/Scoot Transfers: Max assist General transfer comment: attempts to assist but remains difficult  Ambulation/Gait             General Gait Details: not appropriate at this time   Stairs             Wheelchair Mobility    Modified Rankin (Stroke Patients Only)       Balance Overall balance assessment: Needs assistance Sitting-balance support: No upper extremity supported;Feet unsupported Sitting balance-Leahy Scale: Fair         Standing balance comment: not appropriate for standing at this time                            Cognition Arousal/Alertness: Awake/alert Behavior During Therapy: WFL for tasks assessed/performed Overall Cognitive Status: Difficult to assess  Exercises      General Comments        Pertinent Vitals/Pain Pain Assessment: Faces Faces Pain Scale: Hurts even more Pain Location: LLE incision with movement Pain Descriptors / Indicators: Grimacing;Guarding Pain Intervention(s): Limited activity within patient's tolerance;Monitored during session;Repositioned    Home Living                      Prior Function            PT Goals (current goals can now be found in the care plan section) Progress towards PT goals: Progressing toward goals    Frequency     7X/week      PT Plan Current plan remains appropriate    Co-evaluation              AM-PAC PT "6 Clicks" Mobility   Outcome Measure  Help needed turning from your back to your side while in a flat bed without using bedrails?: A Lot Help needed moving from lying on your back to sitting on the side of a flat bed without using bedrails?: A Lot Help needed moving to and from a bed to a chair (including a wheelchair)?: Total Help needed standing up from a chair using your arms (e.g., wheelchair or bedside chair)?: Total Help needed to walk in hospital room?: Total Help needed climbing 3-5 steps with a railing? : Total 6 Click Score: 8    End of Session Equipment Utilized During Treatment: Gait belt Activity Tolerance: Patient tolerated treatment well Patient left: in bed;with call bell/phone within reach;with bed alarm set;Other (comment);with family/visitor present (B heels floating via pillow support) Nurse Communication: Mobility status;Precautions;Weight bearing status PT Visit Diagnosis: Other abnormalities of gait and mobility (R26.89);Muscle weakness (generalized) (M62.81);Repeated falls (R29.6);Pain Pain - Right/Left: Left Pain - part of body: Knee     Time: 1030-1047 PT Time Calculation (min) (ACUTE ONLY): 17 min  Charges:  $Therapeutic Activity: 8-22 mins                    Chesley Noon, PTA 06/27/21, 10:59 AM , 10:58 AM

## 2021-06-27 NOTE — Progress Notes (Signed)
Report called to Jolyn Lent, LPN at [facility]. All questions answered by this RN.

## 2021-06-27 NOTE — TOC Progression Note (Addendum)
Transition of Care [facility]) - Progression Note    Patient Details  Name: Pamela Ball MRN: [id number] Date of Birth: 20-Mar-1946  [facility]) CM/SW Contact  Su Hilt, RN Phone Number: 06/27/2021, 9:42 AM  Clinical Narrative:    Spoke with the husband and he chose [facility], I called Ricky at [facility] and accepted the bed as well as in [employer], Uploaded clinical notes to [facility] to start ins auth,  received approval ref number [id number],    Expected Discharge Plan: [place]    Expected Discharge Plan and Services Expected Discharge Plan: [place]                                               Social Determinants of Health (SDOH) Interventions    Readmission Risk Interventions No flowsheet data found.

## 2021-06-27 NOTE — Care Management Important Message (Signed)
Important Message  Patient Details  Name: Pamela Ball MRN: [id number] Date of Birth: 1946-05-25   Medicare Important Message Given:  Yes     [name] 06/27/2021, 11:10 AM

## 2021-06-28 DIAGNOSIS — S32402A Unspecified fracture of left acetabulum, initial encounter for closed fracture: Secondary | ICD-10-CM | POA: Diagnosis not present

## 2021-06-28 DIAGNOSIS — S72402A Unspecified fracture of lower end of left femur, initial encounter for closed fracture: Secondary | ICD-10-CM | POA: Diagnosis not present

## 2021-06-28 DIAGNOSIS — I10 Essential (primary) hypertension: Secondary | ICD-10-CM | POA: Diagnosis not present

## 2021-06-28 DIAGNOSIS — S32512A Fracture of superior rim of left pubis, initial encounter for closed fracture: Secondary | ICD-10-CM | POA: Diagnosis not present

## 2021-07-06 ENCOUNTER — Other Ambulatory Visit: Payer: Self-pay | Admitting: Family Medicine

## 2021-07-06 DIAGNOSIS — K219 Gastro-esophageal reflux disease without esophagitis: Secondary | ICD-10-CM

## 2021-07-06 NOTE — Telephone Encounter (Signed)
Valid encounter. Future visit in 6 days

## 2021-07-07 DIAGNOSIS — S728X2D Other fracture of left femur, subsequent encounter for closed fracture with routine healing: Secondary | ICD-10-CM | POA: Diagnosis not present

## 2021-07-12 ENCOUNTER — Inpatient Hospital Stay: Payer: Medicare Other | Admitting: Family Medicine

## 2021-07-13 DIAGNOSIS — S42292D Other displaced fracture of upper end of left humerus, subsequent encounter for fracture with routine healing: Secondary | ICD-10-CM | POA: Diagnosis not present

## 2021-07-22 DIAGNOSIS — J449 Chronic obstructive pulmonary disease, unspecified: Secondary | ICD-10-CM | POA: Diagnosis not present

## 2021-07-22 DIAGNOSIS — S32402D Unspecified fracture of left acetabulum, subsequent encounter for fracture with routine healing: Secondary | ICD-10-CM | POA: Diagnosis not present

## 2021-07-22 DIAGNOSIS — S42202D Unspecified fracture of upper end of left humerus, subsequent encounter for fracture with routine healing: Secondary | ICD-10-CM | POA: Diagnosis not present

## 2021-07-23 DIAGNOSIS — R5381 Other malaise: Secondary | ICD-10-CM | POA: Diagnosis not present

## 2021-07-26 ENCOUNTER — Telehealth: Payer: Self-pay | Admitting: Family Medicine

## 2021-07-26 NOTE — Telephone Encounter (Signed)
Patient had recent hospitalization in August for hip fracture from fall. She was discharged with [facility] from the chart.  I cannot order any new home health physical therapy because she was recently hospitalized, it sounds like she should continue with [facility].  If new orders are needed we would have to follow up with her for office visit to place any other home health orders, but this should not be necessary since that company should still be able to see her.  May need to contact [facility] and see what the status is first?  [name], [facility] 07/26/2021, 2:27 PM

## 2021-07-26 NOTE — Telephone Encounter (Signed)
Pts husband called to see if Dr. Raliegh Ip can get pt a home health nurse, PT and OT/ please advise

## 2021-07-27 ENCOUNTER — Telehealth: Payer: Self-pay | Admitting: Family Medicine

## 2021-07-27 ENCOUNTER — Other Ambulatory Visit: Payer: Self-pay | Admitting: Family Medicine

## 2021-07-27 ENCOUNTER — Ambulatory Visit: Payer: Self-pay

## 2021-07-27 DIAGNOSIS — M25552 Pain in left hip: Secondary | ICD-10-CM

## 2021-07-27 DIAGNOSIS — F419 Anxiety disorder, unspecified: Secondary | ICD-10-CM

## 2021-07-27 DIAGNOSIS — S72002D Fracture of unspecified part of neck of left femur, subsequent encounter for closed fracture with routine healing: Secondary | ICD-10-CM

## 2021-07-27 DIAGNOSIS — Z9889 Other specified postprocedural states: Secondary | ICD-10-CM

## 2021-07-27 MED ORDER — ALPRAZOLAM 0.5 MG PO TABS
0.5000 mg | ORAL_TABLET | Freq: Two times a day (BID) | ORAL | 0 refills | Status: DC | PRN
Start: 2021-07-27 — End: 2022-09-06

## 2021-07-27 NOTE — Telephone Encounter (Signed)
Dr. Raliegh Ip has agreed to place a referral. He noted that the referral should be within 30 days of seeing the patient so her husband is aware that they might not accept one from him and at that point, he will need to call [facility] (Dr. Rudene Christians) to get that referred again.

## 2021-07-27 NOTE — Telephone Encounter (Signed)
[name] called and is a medication refill of Alprazolam 0.5 mg to be given as needed for her mood. He says she's has been taking this while at rehab every night, which she came home on last Wednesday, and he has 4 pills left. He says he has not been giving them to her at night. He says he gave her one last Thursday, the again this morning because she was crying about not being able to walk. He says she's in a wheelchair, not able to bear weight. He has to lift her from the bed to the chair or toilet. He says she's is sleeping right now, he gave the Xanax around 1000. He says for the most part her anxiety is not bad but she has her moments. He says he's waiting on PT/OT and a nurse to come out to the home. He says she's not having any thoughts of harming herself or thoughts of being dead. She has an upcoming appointment scheduled with Dr. Raliegh Ip on Tuesday and Ortho on Thursday. Care advice given, advised I will send this request to Dr. Parks Ranger, patient verbalized understanding.   Reason for Disposition  [1] Symptoms of anxiety or panic attack AND [2] is a chronic symptom (recurrent or ongoing AND present > 4 weeks)  Answer Assessment - Initial Assessment Questions 1. CONCERN: "Did anything happen that prompted you to call today?"      Crying this morning wanting to walk, but can't 2. ANXIETY SYMPTOMS: "Can you describe how you (your loved one; patient) have been feeling?" (e.g., tense, restless, panicky, anxious, keyed up, overwhelmed, sense of impending doom).       Dong good this week, crying this morning 3. ONSET: "How long have you been feeling this way?" (e.g., hours, days, weeks)     Restless the other night  4. SEVERITY: "How would you rate the level of anxiety?" (e.g., 0 - 10; or mild, moderate, severe).     8 this morning, now sleeping at this time after Xanax 5. FUNCTIONAL IMPAIRMENT: "How have these feelings affected your ability to do daily activities?" "Have you had more  difficulty than usual doing your normal daily activities?" (e.g., getting better, same, worse; self-care, school, work, interactions)     Not able to walk, wheelchair bound, performs daily activities no problem 6. HISTORY: "Have you felt this way before?" "Have you ever been diagnosed with an anxiety problem in the past?" (e.g., generalized anxiety disorder, panic attacks, PTSD). If Yes, ask: "How was this problem treated?" (e.g., medicines, counseling, etc.)     No, in rehab she was given xanax at night 7. RISK OF HARM - SUICIDAL IDEATION: "Do you ever have thoughts of hurting or killing yourself?" If Yes, ask:  "Do you have these feelings now?" "Do you have a plan on how you would do this?"     No 8. TREATMENT:  "What has been done so far to treat this anxiety?" (e.g., medicines, relaxation strategies). "What has helped?"     Xanax 0.5 mg at night 9. TREATMENT - THERAPIST: "Do you have a counselor or therapist? Name?"     No 10. POTENTIAL TRIGGERS: "Do you drink caffeinated beverages (e.g., coffee, colas, teas), and how much daily?" "Do you drink alcohol or use any drugs?" "Have you started any new medicines recently?"     Tea with meds 10. PATIENT SUPPORT: "Who is with you now?" "Who do you live with?" "Do you have family or friends who you can talk to?"  Yes, husband 52. OTHER SYMPTOMS: "Do you have any other symptoms?" (e.g., feeling depressed, trouble concentrating, trouble sleeping, trouble breathing, palpitations or fast heartbeat, chest pain, sweating, nausea, or diarrhea)       No 12. PREGNANCY: "Is there any chance you are pregnant?" "When was your last menstrual period?"       No  Protocols used: Anxiety and Panic Attack-A-AH

## 2021-07-27 NOTE — Telephone Encounter (Signed)
See triage encounter.

## 2021-07-27 NOTE — Telephone Encounter (Signed)
Patient husband called in to inform Dr Raliegh Ip that they are still waiting for a call about status with  [facility]. Patient is eagerly awaiting a call from office Please call  Ph#  [phone]

## 2021-07-27 NOTE — Telephone Encounter (Signed)
Medication Refill - Medication: Pt's husband called requesting alprazolam for patient's mood. She just got out of rehab, she is currently wheelchair bound and has a having a hard time coping. Needs PT bad she says   Has the patient contacted their pharmacy? Yes.   (Agent: If no, request that the patient contact the pharmacy for the refill.) (Agent: If yes, when and what did the pharmacy advise?)  Preferred Pharmacy (with phone number or street name):  [id number] - [place], [address]  [address] [place] [address]  Phone: [phone] Fax: [phone]   Has the patient been seen for an appointment in the last year OR does the patient have an upcoming appointment? Yes.    Agent: Please be advised that RX refills may take up to 3 business days. We ask that you follow-up with your pharmacy.

## 2021-07-28 NOTE — Telephone Encounter (Signed)
Thanks. I have already ordered Alprazolam and PT referral yesterday after hours 07/27/21.  If you can notify patient/husband FYI  [name], DO [facility] 07/28/2021, 9:21 AM

## 2021-08-02 ENCOUNTER — Other Ambulatory Visit: Payer: Self-pay

## 2021-08-02 ENCOUNTER — Encounter: Payer: Self-pay | Admitting: Family Medicine

## 2021-08-02 ENCOUNTER — Ambulatory Visit (INDEPENDENT_AMBULATORY_CARE_PROVIDER_SITE_OTHER): Payer: Medicare Other | Admitting: Family Medicine

## 2021-08-02 VITALS — BP 120/47 | HR 84 | Ht <= 58 in | Wt 123.0 lb

## 2021-08-02 DIAGNOSIS — J209 Acute bronchitis, unspecified: Secondary | ICD-10-CM

## 2021-08-02 DIAGNOSIS — Z9889 Other specified postprocedural states: Secondary | ICD-10-CM

## 2021-08-02 DIAGNOSIS — S72002D Fracture of unspecified part of neck of left femur, subsequent encounter for closed fracture with routine healing: Secondary | ICD-10-CM

## 2021-08-02 DIAGNOSIS — M25552 Pain in left hip: Secondary | ICD-10-CM

## 2021-08-02 DIAGNOSIS — F419 Anxiety disorder, unspecified: Secondary | ICD-10-CM | POA: Diagnosis not present

## 2021-08-02 MED ORDER — OXYCODONE HCL 5 MG PO TABS: 5.0000 mg | ORAL_TABLET | ORAL | 0 refills | Status: DC | PRN

## 2021-08-02 MED ORDER — AZITHROMYCIN 250 MG PO TABS: ORAL_TABLET | ORAL | 0 refills | Status: DC

## 2021-08-02 NOTE — Patient Instructions (Addendum)
Thank you for coming to the office today.  [facility]) Address: [address], [place],  [address] Phone: [phone]  AVOID Taking whole pill Xanax 0.5mg  try to cut in half separate by a few hours from the pain medication  Refilled pain medicine  Start Azithromycin Z pak (antibiotic) 2 tabs day 1, then 1 tab x 4 days, complete entire course even if improved  Take mucinex OTC to clear  Please schedule a Follow-up Appointment to: Return if symptoms worsen or fail to improve.  If you have any other questions or concerns, please feel free to call the office or send a message through [place]. You may also schedule an earlier appointment if necessary.  Additionally, you may be receiving a survey about your experience at our office within a few days to 1 week by e-mail or mail. We value your feedback.  [name], DO [place]

## 2021-08-02 NOTE — Progress Notes (Signed)
Subjective:    Patient ID: Pamela Ball, female    DOB: Apr 02, 1946, 75 y.o.   MRN: [id number]  GAE BIHL is a 75 y.o. female presenting on 08/02/2021 for Hospitalization Follow-up   [place] VISIT  Hospital/Location: [facility] Date of Admission: 06/22/21 Date of Discharge: 06/27/21 Transitions of care telephone call: Not completed  Reason for Admission: Hip Fracture  [facility] H&P and Discharge Summary have been reviewed - Patient presents today about 1 month after recent hospitalization. Brief summary of recent course, patient had symptoms of fall with hip fracture Left side, s/p surgical repair on 06/23/21, ultimately discharged to nursing skilled, and then to home. Did not have [facility] PT OT at time of discharge.  - Today reports overall has fairly well but difficulty with pain, stress, and anxiety related to the hip injury, some weakness. She has been managed on medication for pain. We have continued anxiety medication on discharge, she was taking Xanax 0.5mg  PRN and only taking it rarely as needed.  PT referral was ordered to [facility] on 9/21, and referral faxed to them on 9/23.   I have reviewed the discharge medication list, and have reconciled the current and discharge medications today.   Depression screen [facility] 2/9 02/08/2021 06/28/2020 06/01/2020  Decreased Interest 0 0 0  Down, Depressed, Hopeless 0 0 0  PHQ - 2 Score 0 0 0  Altered sleeping - 2 1  Tired, decreased energy - 3 2  Change in appetite - 0 0  Feeling bad or failure about yourself  - 0 0  Trouble concentrating - 0 2  Moving slowly or fidgety/restless - 0 1  Suicidal thoughts - 0 0  PHQ-9 Score - 5 6  Difficult doing work/chores - Not difficult at all Not difficult at all  Some recent data might be hidden    Social History   Tobacco Use   Smoking status: Never   Smokeless tobacco: Never  Vaping Use   Vaping Use: Never used  Substance Use Topics   Alcohol use: No     Comment: drink occasionally    Drug use: No    Review of Systems Per HPI unless specifically indicated above     Objective:    BP (!) 120/47   Pulse 84   Ht 4\' 10"  (1.473 m)   Wt 123 lb (55.8 kg)   SpO2 95%   BMI 25.71 kg/m   Wt Readings from Last 3 Encounters:  08/02/21 123 lb (55.8 kg)  06/23/21 122 lb 12.7 oz (55.7 kg)  05/27/21 122 lb 12.8 oz (55.7 kg)    Physical Exam Vitals and nursing note reviewed.  Constitutional:      General: She is not in acute distress.    Appearance: Normal appearance. She is well-developed. She is not diaphoretic.     Comments: Well-appearing, comfortable, cooperative  HENT:     Head: Normocephalic and atraumatic.  Eyes:     General:        Right eye: No discharge.        Left eye: No discharge.     Conjunctiva/sclera: Conjunctivae normal.  Cardiovascular:     Rate and Rhythm: Normal rate.  Pulmonary:     Effort: Pulmonary effort is normal.  Musculoskeletal:     Comments: Sitting in wheelchair today, limited mobility  Skin:    General: Skin is warm and dry.     Findings: No erythema or rash.  Neurological:  Mental Status: She is alert.     Comments: Oriented to person, but not place/time  Psychiatric:        Mood and Affect: Mood normal.        Behavior: Behavior normal.        Thought Content: Thought content normal.     Comments: Well groomed, good eye contact, normal speech and thoughts   Results for orders placed or performed during the hospital encounter of 06/22/21  Resp Panel by RT-PCR (Flu A&B, Covid) Nasopharyngeal Swab   Specimen: Nasopharyngeal Swab; Nasopharyngeal(NP) swabs in vial transport medium  Result Value Ref Range   SARS Coronavirus 2 by RT PCR NEGATIVE NEGATIVE   Influenza A by PCR NEGATIVE NEGATIVE   Influenza B by PCR NEGATIVE NEGATIVE  Resp Panel by RT-PCR (Flu A&B, Covid) Nasopharyngeal Swab   Specimen: Nasopharyngeal Swab; Nasopharyngeal(NP) swabs in vial transport medium  Result Value Ref  Range   SARS Coronavirus 2 by RT PCR NEGATIVE NEGATIVE   Influenza A by PCR NEGATIVE NEGATIVE   Influenza B by PCR NEGATIVE NEGATIVE  CBC with Differential  Result Value Ref Range   WBC 4.4 4.0 - 10.5 K/uL   RBC 4.21 3.87 - 5.11 MIL/uL   Hemoglobin 13.9 12.0 - 15.0 g/dL   HCT 41.8 36.0 - 46.0 %   MCV 99.3 80.0 - 100.0 fL   MCH 33.0 26.0 - 34.0 pg   MCHC 33.3 30.0 - 36.0 g/dL   RDW 12.9 11.5 - 15.5 %   Platelets 238 150 - 400 K/uL   nRBC 0.0 0.0 - 0.2 %   Neutrophils Relative % 48 %   Neutro Abs 2.1 1.7 - 7.7 K/uL   Lymphocytes Relative 37 %   Lymphs Abs 1.6 0.7 - 4.0 K/uL   Monocytes Relative 12 %   Monocytes Absolute 0.5 0.1 - 1.0 K/uL   Eosinophils Relative 2 %   Eosinophils Absolute 0.1 0.0 - 0.5 K/uL   Basophils Relative 1 %   Basophils Absolute 0.0 0.0 - 0.1 K/uL   Immature Granulocytes 0 %   Abs Immature Granulocytes 0.01 0.00 - 0.07 K/uL  Basic metabolic panel  Result Value Ref Range   Sodium 141 135 - 145 mmol/L   Potassium 3.5 3.5 - 5.1 mmol/L   Chloride 102 98 - 111 mmol/L   CO2 26 22 - 32 mmol/L   Glucose, Bld 86 70 - 99 mg/dL   BUN 10 8 - 23 mg/dL   Creatinine, Ser 0.57 0.44 - 1.00 mg/dL   Calcium 8.8 (L) 8.9 - 10.3 mg/dL   GFR, Estimated >60 >60 mL/min   Anion gap 13 5 - 15  CK  Result Value Ref Range   Total CK 389 (H) 38 - 234 U/L  CBC  Result Value Ref Range   WBC 6.6 4.0 - 10.5 K/uL   RBC 3.98 3.87 - 5.11 MIL/uL   Hemoglobin 13.3 12.0 - 15.0 g/dL   HCT 39.9 36.0 - 46.0 %   MCV 100.3 (H) 80.0 - 100.0 fL   MCH 33.4 26.0 - 34.0 pg   MCHC 33.3 30.0 - 36.0 g/dL   RDW 12.9 11.5 - 15.5 %   Platelets 233 150 - 400 K/uL   nRBC 0.0 0.0 - 0.2 %  Creatinine, serum  Result Value Ref Range   Creatinine, Ser 0.49 0.44 - 1.00 mg/dL   GFR, Estimated >60 >60 mL/min  CBC  Result Value Ref Range   WBC 4.6 4.0 - 10.5  K/uL   RBC 3.62 (L) 3.87 - 5.11 MIL/uL   Hemoglobin 12.0 12.0 - 15.0 g/dL   HCT 36.9 36.0 - 46.0 %   MCV 101.9 (H) 80.0 - 100.0 fL   MCH  33.1 26.0 - 34.0 pg   MCHC 32.5 30.0 - 36.0 g/dL   RDW 12.9 11.5 - 15.5 %   Platelets 217 150 - 400 K/uL   nRBC 0.0 0.0 - 0.2 %  Basic metabolic panel  Result Value Ref Range   Sodium 138 135 - 145 mmol/L   Potassium 3.2 (L) 3.5 - 5.1 mmol/L   Chloride 103 98 - 111 mmol/L   CO2 27 22 - 32 mmol/L   Glucose, Bld 177 (H) 70 - 99 mg/dL   BUN 11 8 - 23 mg/dL   Creatinine, Ser 0.55 0.44 - 1.00 mg/dL   Calcium 7.5 (L) 8.9 - 10.3 mg/dL   GFR, Estimated >60 >60 mL/min   Anion gap 8 5 - 15  Potassium  Result Value Ref Range   Potassium 4.7 3.5 - 5.1 mmol/L  CBC  Result Value Ref Range   WBC 3.9 (L) 4.0 - 10.5 K/uL   RBC 3.58 (L) 3.87 - 5.11 MIL/uL   Hemoglobin 11.6 (L) 12.0 - 15.0 g/dL   HCT 36.6 36.0 - 46.0 %   MCV 102.2 (H) 80.0 - 100.0 fL   MCH 32.4 26.0 - 34.0 pg   MCHC 31.7 30.0 - 36.0 g/dL   RDW 12.9 11.5 - 15.5 %   Platelets 252 150 - 400 K/uL   nRBC 0.0 0.0 - 0.2 %      Assessment & Plan:   Problem List Items Addressed This Visit     Anxiety   Other Visit Diagnoses     Closed fracture of left hip with routine healing, subsequent encounter    -  Primary   Relevant Medications   oxyCODONE (OXY IR/ROXICODONE) 5 MG immediate release tablet   Left hip pain       Relevant Medications   oxyCODONE (OXY IR/ROXICODONE) 5 MG immediate release tablet   Status post hip surgery       Relevant Medications   oxyCODONE (OXY IR/ROXICODONE) 5 MG immediate release tablet   Acute bronchitis, unspecified organism       Relevant Medications   azithromycin (ZITHROMAX Z-PAK) 250 MG tablet      Improved after hospitalization Still pain and limited mobility with L hip fracture repair from fall On oxycodone PRN for pain with improved controll PDMP review refill today Next apt with [facility] in 2 days  Referral to [facility] sent on 9/21 and faxed on 9/23, gave # for them to call to check status  Anxiety Related injury and hospital and chronic conditions Already renew  xanax 0.5mg  PRN caution with use, has had some confusion should LIMIT to half tab 0.25mg  sparingly avoid dose with oxycodone.  Sinusitis vs Bronchitis Start Azithromycin Z pak (antibiotic) 2 tabs day 1, then 1 tab x 4 days, complete entire course even if improved    Meds ordered this encounter  Medications   DISCONTD: oxyCODONE (OXY IR/ROXICODONE) 5 MG immediate release tablet    Sig: Take 1 tablet (5 mg total) by mouth every 3 (three) hours as needed for moderate pain.    Dispense:  90 tablet    Refill:  0   oxyCODONE (OXY IR/ROXICODONE) 5 MG immediate release tablet    Sig: Take 1 tablet (5 mg total) by mouth  every 3 (three) hours as needed for moderate pain.    Dispense:  90 tablet    Refill:  0   azithromycin (ZITHROMAX Z-PAK) 250 MG tablet    Sig: Take 2 tabs (500mg  total) on Day 1. Take 1 tab (250mg ) daily for next 4 days.    Dispense:  6 tablet    Refill:  0       Follow up plan: Return if symptoms worsen or fail to improve.  [name], [facility] 08/02/2021, 1:34 PM

## 2021-08-04 DIAGNOSIS — Z9889 Other specified postprocedural states: Secondary | ICD-10-CM | POA: Diagnosis not present

## 2021-08-04 DIAGNOSIS — S42292D Other displaced fracture of upper end of left humerus, subsequent encounter for fracture with routine healing: Secondary | ICD-10-CM | POA: Diagnosis not present

## 2021-08-04 DIAGNOSIS — R6889 Other general symptoms and signs: Secondary | ICD-10-CM | POA: Diagnosis not present

## 2021-08-04 DIAGNOSIS — Z8781 Personal history of (healed) traumatic fracture: Secondary | ICD-10-CM | POA: Diagnosis not present

## 2021-08-04 DIAGNOSIS — M1712 Unilateral primary osteoarthritis, left knee: Secondary | ICD-10-CM | POA: Diagnosis not present

## 2021-08-04 DIAGNOSIS — S728X2D Other fracture of left femur, subsequent encounter for closed fracture with routine healing: Secondary | ICD-10-CM | POA: Diagnosis not present

## 2021-08-08 ENCOUNTER — Telehealth: Payer: Self-pay

## 2021-08-08 NOTE — Telephone Encounter (Signed)
I am not sure what else needs to be done at this point. It looks like patient was seen by me on 08/02/21  Referral was ordered by our office on 07/27/21 and it was faxed to [facility] on 07/29/21  [name], [facility] 08/08/2021, 4:39 PM

## 2021-08-08 NOTE — Telephone Encounter (Signed)
Copied from [place] [id number]. Topic: General - Other >> Aug 08, 2021  1:31 PM Leward Quan A wrote: Reason for CRM: [place] with [facility] called in to say that they need a referral / orders for [place] for patient per her husband patient is needing home care services and would like it from this company please. Would like a call back with and answer Ph# [phone] Fax# [phone]

## 2021-08-11 DIAGNOSIS — M80052D Age-related osteoporosis with current pathological fracture, left femur, subsequent encounter for fracture with routine healing: Secondary | ICD-10-CM | POA: Diagnosis not present

## 2021-08-11 DIAGNOSIS — M800AXD Age-related osteoporosis with current pathological fracture, other site, subsequent encounter for fracture with routine healing: Secondary | ICD-10-CM | POA: Diagnosis not present

## 2021-08-11 DIAGNOSIS — W06XXXD Fall from bed, subsequent encounter: Secondary | ICD-10-CM | POA: Diagnosis not present

## 2021-08-11 DIAGNOSIS — M80022D Age-related osteoporosis with current pathological fracture, left humerus, subsequent encounter for fracture with routine healing: Secondary | ICD-10-CM | POA: Diagnosis not present

## 2021-08-11 DIAGNOSIS — H3530 Unspecified macular degeneration: Secondary | ICD-10-CM | POA: Diagnosis not present

## 2021-08-11 DIAGNOSIS — H547 Unspecified visual loss: Secondary | ICD-10-CM | POA: Diagnosis not present

## 2021-08-11 DIAGNOSIS — I10 Essential (primary) hypertension: Secondary | ICD-10-CM | POA: Diagnosis not present

## 2021-08-12 ENCOUNTER — Telehealth: Payer: Self-pay | Admitting: Family Medicine

## 2021-08-12 DIAGNOSIS — H547 Unspecified visual loss: Secondary | ICD-10-CM | POA: Diagnosis not present

## 2021-08-12 DIAGNOSIS — M800AXD Age-related osteoporosis with current pathological fracture, other site, subsequent encounter for fracture with routine healing: Secondary | ICD-10-CM | POA: Diagnosis not present

## 2021-08-12 DIAGNOSIS — H3530 Unspecified macular degeneration: Secondary | ICD-10-CM | POA: Diagnosis not present

## 2021-08-12 DIAGNOSIS — W06XXXD Fall from bed, subsequent encounter: Secondary | ICD-10-CM | POA: Diagnosis not present

## 2021-08-12 DIAGNOSIS — M80052D Age-related osteoporosis with current pathological fracture, left femur, subsequent encounter for fracture with routine healing: Secondary | ICD-10-CM | POA: Diagnosis not present

## 2021-08-12 DIAGNOSIS — M80022D Age-related osteoporosis with current pathological fracture, left humerus, subsequent encounter for fracture with routine healing: Secondary | ICD-10-CM | POA: Diagnosis not present

## 2021-08-12 DIAGNOSIS — I10 Essential (primary) hypertension: Secondary | ICD-10-CM | POA: Diagnosis not present

## 2021-08-12 NOTE — Telephone Encounter (Signed)
Pamela Ball is aware.

## 2021-08-12 NOTE — Telephone Encounter (Signed)
Okay to proceed with verbal HH orders  [name], [facility] 08/12/2021, 12:05 PM

## 2021-08-12 NOTE — Telephone Encounter (Signed)
Caitlyn OT with [facility] is calling and needs verbal OT order 1 or 2 times a week

## 2021-08-13 ENCOUNTER — Other Ambulatory Visit: Payer: Self-pay | Admitting: Family Medicine

## 2021-08-13 DIAGNOSIS — E782 Mixed hyperlipidemia: Secondary | ICD-10-CM

## 2021-08-13 NOTE — Telephone Encounter (Signed)
Requested medication (s) are due for refill today: yes  Requested medication (s) are on the active medication list: yes  Last refill:  12/16/20 #90 1 RF  Future visit scheduled: no  Notes to clinic:  called pt to make appt- LM on VM to call office to make appointment for labs and refill. Call back number provided.   Requested Prescriptions  Pending Prescriptions Disp Refills   simvastatin (ZOCOR) 20 MG tablet [Pharmacy Med Name: SIMVASTATIN 20 MG TABLET] 90 tablet 1    Sig: TAKE 1 TABLET BY MOUTH EVERY DAY IN THE MORNING     Cardiovascular:  Antilipid - Statins Failed - 08/13/2021  9:11 AM      Failed - Total Cholesterol in normal range and within 360 days    Cholesterol  Date Value Ref Range Status  06/28/2020 215 (H) <200 mg/dL Final          Failed - LDL in normal range and within 360 days    LDL Cholesterol (Calc)  Date Value Ref Range Status  06/28/2020 114 (H) mg/dL (calc) Final    Comment:    Reference range: <100 . Desirable range <100 mg/dL for primary prevention;   <70 mg/dL for patients with CHD or diabetic patients  with > or = 2 CHD risk factors. Marland Kitchen LDL-C is now calculated using the Martin-Hopkins  calculation, which is a validated novel method providing  better accuracy than the Friedewald equation in the  estimation of LDL-C.  Cresenciano Genre et al. Annamaria Helling. 6546;503(54): 2061-2068  (http://education.[employer].com/faq/FAQ164)           Failed - HDL in normal range and within 360 days    HDL  Date Value Ref Range Status  06/28/2020 77 > OR = 50 mg/dL Final          Failed - Triglycerides in normal range and within 360 days    Triglycerides  Date Value Ref Range Status  06/28/2020 125 <150 mg/dL Final          Passed - Patient is not pregnant      Passed - Valid encounter within last 12 months    Recent Outpatient Visits           1 week ago Closed fracture of left hip with routine healing, subsequent encounter   [place], DO   2 months ago Closed fracture of left hip, initial encounter [facility])   [place], DO   6 months ago Acute cystitis with hematuria   [place], DO   9 months ago Mass of right breast   [place], DO   1 year ago Rib pain on right side   [facility], Lupita Raider, FNP       Future Appointments             In 6 months [facility], [facility]

## 2021-08-15 DIAGNOSIS — H547 Unspecified visual loss: Secondary | ICD-10-CM | POA: Diagnosis not present

## 2021-08-15 DIAGNOSIS — W06XXXD Fall from bed, subsequent encounter: Secondary | ICD-10-CM | POA: Diagnosis not present

## 2021-08-15 DIAGNOSIS — H3530 Unspecified macular degeneration: Secondary | ICD-10-CM | POA: Diagnosis not present

## 2021-08-15 DIAGNOSIS — I10 Essential (primary) hypertension: Secondary | ICD-10-CM | POA: Diagnosis not present

## 2021-08-15 DIAGNOSIS — M80022D Age-related osteoporosis with current pathological fracture, left humerus, subsequent encounter for fracture with routine healing: Secondary | ICD-10-CM | POA: Diagnosis not present

## 2021-08-15 DIAGNOSIS — M80052D Age-related osteoporosis with current pathological fracture, left femur, subsequent encounter for fracture with routine healing: Secondary | ICD-10-CM | POA: Diagnosis not present

## 2021-08-15 DIAGNOSIS — M800AXD Age-related osteoporosis with current pathological fracture, other site, subsequent encounter for fracture with routine healing: Secondary | ICD-10-CM | POA: Diagnosis not present

## 2021-08-16 DIAGNOSIS — W06XXXD Fall from bed, subsequent encounter: Secondary | ICD-10-CM | POA: Diagnosis not present

## 2021-08-16 DIAGNOSIS — H547 Unspecified visual loss: Secondary | ICD-10-CM | POA: Diagnosis not present

## 2021-08-16 DIAGNOSIS — M80052D Age-related osteoporosis with current pathological fracture, left femur, subsequent encounter for fracture with routine healing: Secondary | ICD-10-CM | POA: Diagnosis not present

## 2021-08-16 DIAGNOSIS — M800AXD Age-related osteoporosis with current pathological fracture, other site, subsequent encounter for fracture with routine healing: Secondary | ICD-10-CM | POA: Diagnosis not present

## 2021-08-16 DIAGNOSIS — M80022D Age-related osteoporosis with current pathological fracture, left humerus, subsequent encounter for fracture with routine healing: Secondary | ICD-10-CM | POA: Diagnosis not present

## 2021-08-16 DIAGNOSIS — I10 Essential (primary) hypertension: Secondary | ICD-10-CM | POA: Diagnosis not present

## 2021-08-16 DIAGNOSIS — H3530 Unspecified macular degeneration: Secondary | ICD-10-CM | POA: Diagnosis not present

## 2021-08-18 DIAGNOSIS — I10 Essential (primary) hypertension: Secondary | ICD-10-CM | POA: Diagnosis not present

## 2021-08-18 DIAGNOSIS — M80022D Age-related osteoporosis with current pathological fracture, left humerus, subsequent encounter for fracture with routine healing: Secondary | ICD-10-CM | POA: Diagnosis not present

## 2021-08-18 DIAGNOSIS — H547 Unspecified visual loss: Secondary | ICD-10-CM | POA: Diagnosis not present

## 2021-08-18 DIAGNOSIS — M80052D Age-related osteoporosis with current pathological fracture, left femur, subsequent encounter for fracture with routine healing: Secondary | ICD-10-CM | POA: Diagnosis not present

## 2021-08-18 DIAGNOSIS — H3530 Unspecified macular degeneration: Secondary | ICD-10-CM | POA: Diagnosis not present

## 2021-08-18 DIAGNOSIS — W06XXXD Fall from bed, subsequent encounter: Secondary | ICD-10-CM | POA: Diagnosis not present

## 2021-08-18 DIAGNOSIS — M800AXD Age-related osteoporosis with current pathological fracture, other site, subsequent encounter for fracture with routine healing: Secondary | ICD-10-CM | POA: Diagnosis not present

## 2021-08-21 DIAGNOSIS — S32402D Unspecified fracture of left acetabulum, subsequent encounter for fracture with routine healing: Secondary | ICD-10-CM | POA: Diagnosis not present

## 2021-08-21 DIAGNOSIS — S42202D Unspecified fracture of upper end of left humerus, subsequent encounter for fracture with routine healing: Secondary | ICD-10-CM | POA: Diagnosis not present

## 2021-08-21 DIAGNOSIS — J449 Chronic obstructive pulmonary disease, unspecified: Secondary | ICD-10-CM | POA: Diagnosis not present

## 2021-08-22 DIAGNOSIS — H3530 Unspecified macular degeneration: Secondary | ICD-10-CM | POA: Diagnosis not present

## 2021-08-22 DIAGNOSIS — R5381 Other malaise: Secondary | ICD-10-CM | POA: Diagnosis not present

## 2021-08-22 DIAGNOSIS — H547 Unspecified visual loss: Secondary | ICD-10-CM | POA: Diagnosis not present

## 2021-08-22 DIAGNOSIS — I10 Essential (primary) hypertension: Secondary | ICD-10-CM | POA: Diagnosis not present

## 2021-08-22 DIAGNOSIS — M800AXD Age-related osteoporosis with current pathological fracture, other site, subsequent encounter for fracture with routine healing: Secondary | ICD-10-CM | POA: Diagnosis not present

## 2021-08-22 DIAGNOSIS — M80022D Age-related osteoporosis with current pathological fracture, left humerus, subsequent encounter for fracture with routine healing: Secondary | ICD-10-CM | POA: Diagnosis not present

## 2021-08-22 DIAGNOSIS — M80052D Age-related osteoporosis with current pathological fracture, left femur, subsequent encounter for fracture with routine healing: Secondary | ICD-10-CM | POA: Diagnosis not present

## 2021-08-22 DIAGNOSIS — W06XXXD Fall from bed, subsequent encounter: Secondary | ICD-10-CM | POA: Diagnosis not present

## 2021-08-23 DIAGNOSIS — M800AXD Age-related osteoporosis with current pathological fracture, other site, subsequent encounter for fracture with routine healing: Secondary | ICD-10-CM | POA: Diagnosis not present

## 2021-08-23 DIAGNOSIS — M80052D Age-related osteoporosis with current pathological fracture, left femur, subsequent encounter for fracture with routine healing: Secondary | ICD-10-CM | POA: Diagnosis not present

## 2021-08-23 DIAGNOSIS — I10 Essential (primary) hypertension: Secondary | ICD-10-CM | POA: Diagnosis not present

## 2021-08-23 DIAGNOSIS — M80022D Age-related osteoporosis with current pathological fracture, left humerus, subsequent encounter for fracture with routine healing: Secondary | ICD-10-CM | POA: Diagnosis not present

## 2021-08-23 DIAGNOSIS — W06XXXD Fall from bed, subsequent encounter: Secondary | ICD-10-CM | POA: Diagnosis not present

## 2021-08-23 DIAGNOSIS — H3530 Unspecified macular degeneration: Secondary | ICD-10-CM | POA: Diagnosis not present

## 2021-08-23 DIAGNOSIS — H547 Unspecified visual loss: Secondary | ICD-10-CM | POA: Diagnosis not present

## 2021-08-24 DIAGNOSIS — W06XXXD Fall from bed, subsequent encounter: Secondary | ICD-10-CM | POA: Diagnosis not present

## 2021-08-24 DIAGNOSIS — M80022D Age-related osteoporosis with current pathological fracture, left humerus, subsequent encounter for fracture with routine healing: Secondary | ICD-10-CM | POA: Diagnosis not present

## 2021-08-24 DIAGNOSIS — M800AXD Age-related osteoporosis with current pathological fracture, other site, subsequent encounter for fracture with routine healing: Secondary | ICD-10-CM | POA: Diagnosis not present

## 2021-08-24 DIAGNOSIS — H3530 Unspecified macular degeneration: Secondary | ICD-10-CM | POA: Diagnosis not present

## 2021-08-24 DIAGNOSIS — M80052D Age-related osteoporosis with current pathological fracture, left femur, subsequent encounter for fracture with routine healing: Secondary | ICD-10-CM | POA: Diagnosis not present

## 2021-08-24 DIAGNOSIS — H547 Unspecified visual loss: Secondary | ICD-10-CM | POA: Diagnosis not present

## 2021-08-24 DIAGNOSIS — I10 Essential (primary) hypertension: Secondary | ICD-10-CM | POA: Diagnosis not present

## 2021-08-29 DIAGNOSIS — M80022D Age-related osteoporosis with current pathological fracture, left humerus, subsequent encounter for fracture with routine healing: Secondary | ICD-10-CM | POA: Diagnosis not present

## 2021-08-29 DIAGNOSIS — I10 Essential (primary) hypertension: Secondary | ICD-10-CM | POA: Diagnosis not present

## 2021-08-29 DIAGNOSIS — W06XXXD Fall from bed, subsequent encounter: Secondary | ICD-10-CM | POA: Diagnosis not present

## 2021-08-29 DIAGNOSIS — H547 Unspecified visual loss: Secondary | ICD-10-CM | POA: Diagnosis not present

## 2021-08-29 DIAGNOSIS — H3530 Unspecified macular degeneration: Secondary | ICD-10-CM | POA: Diagnosis not present

## 2021-08-29 DIAGNOSIS — M80052D Age-related osteoporosis with current pathological fracture, left femur, subsequent encounter for fracture with routine healing: Secondary | ICD-10-CM | POA: Diagnosis not present

## 2021-08-29 DIAGNOSIS — M800AXD Age-related osteoporosis with current pathological fracture, other site, subsequent encounter for fracture with routine healing: Secondary | ICD-10-CM | POA: Diagnosis not present

## 2021-08-30 DIAGNOSIS — W06XXXD Fall from bed, subsequent encounter: Secondary | ICD-10-CM | POA: Diagnosis not present

## 2021-08-30 DIAGNOSIS — M800AXD Age-related osteoporosis with current pathological fracture, other site, subsequent encounter for fracture with routine healing: Secondary | ICD-10-CM | POA: Diagnosis not present

## 2021-08-30 DIAGNOSIS — M80052D Age-related osteoporosis with current pathological fracture, left femur, subsequent encounter for fracture with routine healing: Secondary | ICD-10-CM | POA: Diagnosis not present

## 2021-08-30 DIAGNOSIS — M80022D Age-related osteoporosis with current pathological fracture, left humerus, subsequent encounter for fracture with routine healing: Secondary | ICD-10-CM | POA: Diagnosis not present

## 2021-08-30 DIAGNOSIS — H3530 Unspecified macular degeneration: Secondary | ICD-10-CM | POA: Diagnosis not present

## 2021-08-30 DIAGNOSIS — H547 Unspecified visual loss: Secondary | ICD-10-CM | POA: Diagnosis not present

## 2021-08-30 DIAGNOSIS — I10 Essential (primary) hypertension: Secondary | ICD-10-CM | POA: Diagnosis not present

## 2021-09-01 DIAGNOSIS — H3530 Unspecified macular degeneration: Secondary | ICD-10-CM | POA: Diagnosis not present

## 2021-09-01 DIAGNOSIS — M800AXD Age-related osteoporosis with current pathological fracture, other site, subsequent encounter for fracture with routine healing: Secondary | ICD-10-CM | POA: Diagnosis not present

## 2021-09-01 DIAGNOSIS — H547 Unspecified visual loss: Secondary | ICD-10-CM | POA: Diagnosis not present

## 2021-09-01 DIAGNOSIS — W06XXXD Fall from bed, subsequent encounter: Secondary | ICD-10-CM | POA: Diagnosis not present

## 2021-09-01 DIAGNOSIS — M80022D Age-related osteoporosis with current pathological fracture, left humerus, subsequent encounter for fracture with routine healing: Secondary | ICD-10-CM | POA: Diagnosis not present

## 2021-09-01 DIAGNOSIS — I10 Essential (primary) hypertension: Secondary | ICD-10-CM | POA: Diagnosis not present

## 2021-09-01 DIAGNOSIS — M80052D Age-related osteoporosis with current pathological fracture, left femur, subsequent encounter for fracture with routine healing: Secondary | ICD-10-CM | POA: Diagnosis not present

## 2021-09-05 DIAGNOSIS — H3530 Unspecified macular degeneration: Secondary | ICD-10-CM | POA: Diagnosis not present

## 2021-09-05 DIAGNOSIS — I10 Essential (primary) hypertension: Secondary | ICD-10-CM | POA: Diagnosis not present

## 2021-09-05 DIAGNOSIS — M80022D Age-related osteoporosis with current pathological fracture, left humerus, subsequent encounter for fracture with routine healing: Secondary | ICD-10-CM | POA: Diagnosis not present

## 2021-09-05 DIAGNOSIS — H547 Unspecified visual loss: Secondary | ICD-10-CM | POA: Diagnosis not present

## 2021-09-05 DIAGNOSIS — M800AXD Age-related osteoporosis with current pathological fracture, other site, subsequent encounter for fracture with routine healing: Secondary | ICD-10-CM | POA: Diagnosis not present

## 2021-09-05 DIAGNOSIS — M80052D Age-related osteoporosis with current pathological fracture, left femur, subsequent encounter for fracture with routine healing: Secondary | ICD-10-CM | POA: Diagnosis not present

## 2021-09-05 DIAGNOSIS — W06XXXD Fall from bed, subsequent encounter: Secondary | ICD-10-CM | POA: Diagnosis not present

## 2021-09-06 ENCOUNTER — Other Ambulatory Visit: Payer: Self-pay

## 2021-09-06 ENCOUNTER — Ambulatory Visit (INDEPENDENT_AMBULATORY_CARE_PROVIDER_SITE_OTHER): Payer: Medicare Other | Admitting: Family Medicine

## 2021-09-06 ENCOUNTER — Encounter: Payer: Self-pay | Admitting: Family Medicine

## 2021-09-06 DIAGNOSIS — H547 Unspecified visual loss: Secondary | ICD-10-CM | POA: Diagnosis not present

## 2021-09-06 DIAGNOSIS — H3530 Unspecified macular degeneration: Secondary | ICD-10-CM | POA: Diagnosis not present

## 2021-09-06 DIAGNOSIS — M800AXD Age-related osteoporosis with current pathological fracture, other site, subsequent encounter for fracture with routine healing: Secondary | ICD-10-CM | POA: Diagnosis not present

## 2021-09-06 DIAGNOSIS — M80022D Age-related osteoporosis with current pathological fracture, left humerus, subsequent encounter for fracture with routine healing: Secondary | ICD-10-CM | POA: Diagnosis not present

## 2021-09-06 DIAGNOSIS — S72002D Fracture of unspecified part of neck of left femur, subsequent encounter for closed fracture with routine healing: Secondary | ICD-10-CM

## 2021-09-06 DIAGNOSIS — I10 Essential (primary) hypertension: Secondary | ICD-10-CM | POA: Diagnosis not present

## 2021-09-06 DIAGNOSIS — M25552 Pain in left hip: Secondary | ICD-10-CM

## 2021-09-06 DIAGNOSIS — Z9889 Other specified postprocedural states: Secondary | ICD-10-CM

## 2021-09-06 DIAGNOSIS — W06XXXD Fall from bed, subsequent encounter: Secondary | ICD-10-CM | POA: Diagnosis not present

## 2021-09-06 DIAGNOSIS — M80052D Age-related osteoporosis with current pathological fracture, left femur, subsequent encounter for fracture with routine healing: Secondary | ICD-10-CM | POA: Diagnosis not present

## 2021-09-06 MED ORDER — OXYCODONE HCL 5 MG PO TABS: 5.0000 mg | ORAL_TABLET | ORAL | 0 refills | Status: DC | PRN

## 2021-09-06 NOTE — Progress Notes (Signed)
Subjective:    Patient ID: Pamela Ball, female    DOB: [birth year], 75 y.o.   MRN: [id number]  Pamela Ball is a 75 y.o. female presenting on 09/06/2021 for Leg Injury, Leg Pain, and Arm Pain   HPI  L Hip Fracture R Arm bruising Since last visit HFU 08/02/21, patient has managed well at home with oxycodone PRN for pain with good results and now Home Health currently involved and helping her with RN and PT  Dr Rudene Christians, [facility] next week for next apt and updated X-ray imaging and further treatment  She still remains limited mobility, using wheelchair, difficulty weight bearing on L side.  Now >30 days later due for refill Oxycodone for PRN use taking 2-3 per day usually sometimes can skip  Bruising on arm, upper extremity due to thin skin. No fall or other injury new.   Depression screen [facility] 2/9 02/08/2021 06/28/2020 06/01/2020  Decreased Interest 0 0 0  Down, Depressed, Hopeless 0 0 0  PHQ - 2 Score 0 0 0  Altered sleeping - 2 1  Tired, decreased energy - 3 2  Change in appetite - 0 0  Feeling bad or failure about yourself  - 0 0  Trouble concentrating - 0 2  Moving slowly or fidgety/restless - 0 1  Suicidal thoughts - 0 0  PHQ-9 Score - 5 6  Difficult doing work/chores - Not difficult at all Not difficult at all  Some recent data might be hidden    Social History   Tobacco Use   Smoking status: Never   Smokeless tobacco: Never  Vaping Use   Vaping Use: Never used  Substance Use Topics   Alcohol use: No    Comment: drink occasionally    Drug use: No    Review of Systems Per HPI unless specifically indicated above     Objective:    BP (!) 102/46   Pulse 93   Ht 4\' 10"  (1.473 m)   Wt 123 lb (55.8 kg)   SpO2 96%   BMI 25.71 kg/m   Wt Readings from Last 3 Encounters:  09/06/21 123 lb (55.8 kg)  08/02/21 123 lb (55.8 kg)  06/23/21 122 lb 12.7 oz (55.7 kg)    Physical Exam Vitals and nursing note reviewed.  Constitutional:      General: She is  not in acute distress.    Appearance: Normal appearance. She is well-developed. She is not diaphoretic.     Comments: Well-appearing, comfortable, cooperative  HENT:     Head: Normocephalic and atraumatic.  Eyes:     General:        Right eye: No discharge.        Left eye: No discharge.     Conjunctiva/sclera: Conjunctivae normal.  Cardiovascular:     Rate and Rhythm: Normal rate.  Pulmonary:     Effort: Pulmonary effort is normal.  Skin:    General: Skin is warm and dry.     Findings: No erythema or rash.  Neurological:     Mental Status: She is alert and oriented to person, place, and time.  Psychiatric:        Mood and Affect: Mood normal.        Behavior: Behavior normal.        Thought Content: Thought content normal.     Comments: Well groomed, good eye contact, normal speech and thoughts   Results for orders placed or performed during the hospital encounter of  06/22/21  Resp Panel by RT-PCR (Flu A&B, Covid) Nasopharyngeal Swab   Specimen: Nasopharyngeal Swab; Nasopharyngeal(NP) swabs in vial transport medium  Result Value Ref Range   SARS Coronavirus 2 by RT PCR NEGATIVE NEGATIVE   Influenza A by PCR NEGATIVE NEGATIVE   Influenza B by PCR NEGATIVE NEGATIVE  Resp Panel by RT-PCR (Flu A&B, Covid) Nasopharyngeal Swab   Specimen: Nasopharyngeal Swab; Nasopharyngeal(NP) swabs in vial transport medium  Result Value Ref Range   SARS Coronavirus 2 by RT PCR NEGATIVE NEGATIVE   Influenza A by PCR NEGATIVE NEGATIVE   Influenza B by PCR NEGATIVE NEGATIVE  CBC with Differential  Result Value Ref Range   WBC 4.4 4.0 - 10.5 K/uL   RBC 4.21 3.87 - 5.11 MIL/uL   Hemoglobin 13.9 12.0 - 15.0 g/dL   HCT 41.8 36.0 - 46.0 %   MCV 99.3 80.0 - 100.0 fL   MCH 33.0 26.0 - 34.0 pg   MCHC 33.3 30.0 - 36.0 g/dL   RDW 12.9 11.5 - 15.5 %   Platelets 238 150 - 400 K/uL   nRBC 0.0 0.0 - 0.2 %   Neutrophils Relative % 48 %   Neutro Abs 2.1 1.7 - 7.7 K/uL   Lymphocytes Relative 37 %    Lymphs Abs 1.6 0.7 - 4.0 K/uL   Monocytes Relative 12 %   Monocytes Absolute 0.5 0.1 - 1.0 K/uL   Eosinophils Relative 2 %   Eosinophils Absolute 0.1 0.0 - 0.5 K/uL   Basophils Relative 1 %   Basophils Absolute 0.0 0.0 - 0.1 K/uL   Immature Granulocytes 0 %   Abs Immature Granulocytes 0.01 0.00 - 0.07 K/uL  Basic metabolic panel  Result Value Ref Range   Sodium 141 135 - 145 mmol/L   Potassium 3.5 3.5 - 5.1 mmol/L   Chloride 102 98 - 111 mmol/L   CO2 26 22 - 32 mmol/L   Glucose, Bld 86 70 - 99 mg/dL   BUN 10 8 - 23 mg/dL   Creatinine, Ser 0.57 0.44 - 1.00 mg/dL   Calcium 8.8 (L) 8.9 - 10.3 mg/dL   GFR, Estimated >60 >60 mL/min   Anion gap 13 5 - 15  CK  Result Value Ref Range   Total CK 389 (H) 38 - 234 U/L  CBC  Result Value Ref Range   WBC 6.6 4.0 - 10.5 K/uL   RBC 3.98 3.87 - 5.11 MIL/uL   Hemoglobin 13.3 12.0 - 15.0 g/dL   HCT 39.9 36.0 - 46.0 %   MCV 100.3 (H) 80.0 - 100.0 fL   MCH 33.4 26.0 - 34.0 pg   MCHC 33.3 30.0 - 36.0 g/dL   RDW 12.9 11.5 - 15.5 %   Platelets 233 150 - 400 K/uL   nRBC 0.0 0.0 - 0.2 %  Creatinine, serum  Result Value Ref Range   Creatinine, Ser 0.49 0.44 - 1.00 mg/dL   GFR, Estimated >60 >60 mL/min  CBC  Result Value Ref Range   WBC 4.6 4.0 - 10.5 K/uL   RBC 3.62 (L) 3.87 - 5.11 MIL/uL   Hemoglobin 12.0 12.0 - 15.0 g/dL   HCT 36.9 36.0 - 46.0 %   MCV 101.9 (H) 80.0 - 100.0 fL   MCH 33.1 26.0 - 34.0 pg   MCHC 32.5 30.0 - 36.0 g/dL   RDW 12.9 11.5 - 15.5 %   Platelets 217 150 - 400 K/uL   nRBC 0.0 0.0 - 0.2 %  Basic metabolic panel  Result Value Ref Range   Sodium 138 135 - 145 mmol/L   Potassium 3.2 (L) 3.5 - 5.1 mmol/L   Chloride 103 98 - 111 mmol/L   CO2 27 22 - 32 mmol/L   Glucose, Bld 177 (H) 70 - 99 mg/dL   BUN 11 8 - 23 mg/dL   Creatinine, Ser 0.55 0.44 - 1.00 mg/dL   Calcium 7.5 (L) 8.9 - 10.3 mg/dL   GFR, Estimated >60 >60 mL/min   Anion gap 8 5 - 15  Potassium  Result Value Ref Range   Potassium 4.7 3.5 - 5.1 mmol/L   CBC  Result Value Ref Range   WBC 3.9 (L) 4.0 - 10.5 K/uL   RBC 3.58 (L) 3.87 - 5.11 MIL/uL   Hemoglobin 11.6 (L) 12.0 - 15.0 g/dL   HCT 36.6 36.0 - 46.0 %   MCV 102.2 (H) 80.0 - 100.0 fL   MCH 32.4 26.0 - 34.0 pg   MCHC 31.7 30.0 - 36.0 g/dL   RDW 12.9 11.5 - 15.5 %   Platelets 252 150 - 400 K/uL   nRBC 0.0 0.0 - 0.2 %      Assessment & Plan:   Problem List Items Addressed This Visit   None Visit Diagnoses     Closed fracture of left hip with routine healing, subsequent encounter       Relevant Medications   oxyCODONE (OXY IR/ROXICODONE) 5 MG immediate release tablet   Left hip pain       Relevant Medications   oxyCODONE (OXY IR/ROXICODONE) 5 MG immediate release tablet   Status post hip surgery       Relevant Medications   oxyCODONE (OXY IR/ROXICODONE) 5 MG immediate release tablet       Persistent L lower extremity pain following L hip fracture s/p surgery Re order Oxycodone 5mg  IR PRN up to 3 times per day as advised Continue HH PT Proceed w/ Dr [facility] f/u in 1 week for further management.   Meds ordered this encounter  Medications   oxyCODONE (OXY IR/ROXICODONE) 5 MG immediate release tablet    Sig: Take 1 tablet (5 mg total) by mouth every 3 (three) hours as needed for moderate pain.    Dispense:  90 tablet    Refill:  0      Follow up plan: Return if symptoms worsen or fail to improve.   [name], DO [facility] 09/06/2021, 11:13 AM

## 2021-09-06 NOTE — Patient Instructions (Addendum)
Thank you for coming to the office today.  Will stay tuned for report from Dr Denyse Dago refill 90 pill count on Oxycodone as needed up to 3 times per day.  Please schedule a Follow-up Appointment to: Return if symptoms worsen or fail to improve.  If you have any other questions or concerns, please feel free to call the office or send a message through [place]. You may also schedule an earlier appointment if necessary.  Additionally, you may be receiving a survey about your experience at our office within a few days to 1 week by e-mail or mail. We value your feedback.  [name], DO [place]

## 2021-09-08 DIAGNOSIS — H3530 Unspecified macular degeneration: Secondary | ICD-10-CM | POA: Diagnosis not present

## 2021-09-08 DIAGNOSIS — H547 Unspecified visual loss: Secondary | ICD-10-CM | POA: Diagnosis not present

## 2021-09-08 DIAGNOSIS — I10 Essential (primary) hypertension: Secondary | ICD-10-CM | POA: Diagnosis not present

## 2021-09-08 DIAGNOSIS — M80022D Age-related osteoporosis with current pathological fracture, left humerus, subsequent encounter for fracture with routine healing: Secondary | ICD-10-CM | POA: Diagnosis not present

## 2021-09-08 DIAGNOSIS — M80052D Age-related osteoporosis with current pathological fracture, left femur, subsequent encounter for fracture with routine healing: Secondary | ICD-10-CM | POA: Diagnosis not present

## 2021-09-08 DIAGNOSIS — M800AXD Age-related osteoporosis with current pathological fracture, other site, subsequent encounter for fracture with routine healing: Secondary | ICD-10-CM | POA: Diagnosis not present

## 2021-09-08 DIAGNOSIS — W06XXXD Fall from bed, subsequent encounter: Secondary | ICD-10-CM | POA: Diagnosis not present

## 2021-09-12 DIAGNOSIS — I10 Essential (primary) hypertension: Secondary | ICD-10-CM | POA: Diagnosis not present

## 2021-09-12 DIAGNOSIS — W06XXXD Fall from bed, subsequent encounter: Secondary | ICD-10-CM | POA: Diagnosis not present

## 2021-09-12 DIAGNOSIS — M80022D Age-related osteoporosis with current pathological fracture, left humerus, subsequent encounter for fracture with routine healing: Secondary | ICD-10-CM | POA: Diagnosis not present

## 2021-09-12 DIAGNOSIS — M80052D Age-related osteoporosis with current pathological fracture, left femur, subsequent encounter for fracture with routine healing: Secondary | ICD-10-CM | POA: Diagnosis not present

## 2021-09-12 DIAGNOSIS — H3530 Unspecified macular degeneration: Secondary | ICD-10-CM | POA: Diagnosis not present

## 2021-09-12 DIAGNOSIS — H547 Unspecified visual loss: Secondary | ICD-10-CM | POA: Diagnosis not present

## 2021-09-12 DIAGNOSIS — M800AXD Age-related osteoporosis with current pathological fracture, other site, subsequent encounter for fracture with routine healing: Secondary | ICD-10-CM | POA: Diagnosis not present

## 2021-09-13 ENCOUNTER — Telehealth: Payer: Self-pay | Admitting: Oncology

## 2021-09-13 NOTE — Telephone Encounter (Signed)
Husband called to cancel pt appt. Please call to reschedule at [phone]

## 2021-09-15 DIAGNOSIS — Z8781 Personal history of (healed) traumatic fracture: Secondary | ICD-10-CM | POA: Diagnosis not present

## 2021-09-15 DIAGNOSIS — M79602 Pain in left arm: Secondary | ICD-10-CM | POA: Diagnosis not present

## 2021-09-15 DIAGNOSIS — Z9889 Other specified postprocedural states: Secondary | ICD-10-CM | POA: Diagnosis not present

## 2021-09-15 DIAGNOSIS — M25512 Pain in left shoulder: Secondary | ICD-10-CM | POA: Diagnosis not present

## 2021-09-15 DIAGNOSIS — M1712 Unilateral primary osteoarthritis, left knee: Secondary | ICD-10-CM | POA: Diagnosis not present

## 2021-09-16 ENCOUNTER — Inpatient Hospital Stay: Payer: Medicare Other

## 2021-09-16 DIAGNOSIS — H3530 Unspecified macular degeneration: Secondary | ICD-10-CM | POA: Diagnosis not present

## 2021-09-16 DIAGNOSIS — W06XXXD Fall from bed, subsequent encounter: Secondary | ICD-10-CM | POA: Diagnosis not present

## 2021-09-16 DIAGNOSIS — H547 Unspecified visual loss: Secondary | ICD-10-CM | POA: Diagnosis not present

## 2021-09-16 DIAGNOSIS — M80052D Age-related osteoporosis with current pathological fracture, left femur, subsequent encounter for fracture with routine healing: Secondary | ICD-10-CM | POA: Diagnosis not present

## 2021-09-16 DIAGNOSIS — M80022D Age-related osteoporosis with current pathological fracture, left humerus, subsequent encounter for fracture with routine healing: Secondary | ICD-10-CM | POA: Diagnosis not present

## 2021-09-16 DIAGNOSIS — I10 Essential (primary) hypertension: Secondary | ICD-10-CM | POA: Diagnosis not present

## 2021-09-16 DIAGNOSIS — M800AXD Age-related osteoporosis with current pathological fracture, other site, subsequent encounter for fracture with routine healing: Secondary | ICD-10-CM | POA: Diagnosis not present

## 2021-09-19 DIAGNOSIS — H547 Unspecified visual loss: Secondary | ICD-10-CM | POA: Diagnosis not present

## 2021-09-19 DIAGNOSIS — M80022D Age-related osteoporosis with current pathological fracture, left humerus, subsequent encounter for fracture with routine healing: Secondary | ICD-10-CM | POA: Diagnosis not present

## 2021-09-19 DIAGNOSIS — W06XXXD Fall from bed, subsequent encounter: Secondary | ICD-10-CM | POA: Diagnosis not present

## 2021-09-19 DIAGNOSIS — H3530 Unspecified macular degeneration: Secondary | ICD-10-CM | POA: Diagnosis not present

## 2021-09-19 DIAGNOSIS — M80052D Age-related osteoporosis with current pathological fracture, left femur, subsequent encounter for fracture with routine healing: Secondary | ICD-10-CM | POA: Diagnosis not present

## 2021-09-19 DIAGNOSIS — M800AXD Age-related osteoporosis with current pathological fracture, other site, subsequent encounter for fracture with routine healing: Secondary | ICD-10-CM | POA: Diagnosis not present

## 2021-09-19 DIAGNOSIS — I10 Essential (primary) hypertension: Secondary | ICD-10-CM | POA: Diagnosis not present

## 2021-09-20 ENCOUNTER — Ambulatory Visit: Payer: Medicare Other | Admitting: Oncology

## 2021-09-20 DIAGNOSIS — I10 Essential (primary) hypertension: Secondary | ICD-10-CM | POA: Diagnosis not present

## 2021-09-20 DIAGNOSIS — M80052D Age-related osteoporosis with current pathological fracture, left femur, subsequent encounter for fracture with routine healing: Secondary | ICD-10-CM | POA: Diagnosis not present

## 2021-09-20 DIAGNOSIS — M80022D Age-related osteoporosis with current pathological fracture, left humerus, subsequent encounter for fracture with routine healing: Secondary | ICD-10-CM | POA: Diagnosis not present

## 2021-09-20 DIAGNOSIS — H3530 Unspecified macular degeneration: Secondary | ICD-10-CM | POA: Diagnosis not present

## 2021-09-20 DIAGNOSIS — W06XXXD Fall from bed, subsequent encounter: Secondary | ICD-10-CM | POA: Diagnosis not present

## 2021-09-20 DIAGNOSIS — M800AXD Age-related osteoporosis with current pathological fracture, other site, subsequent encounter for fracture with routine healing: Secondary | ICD-10-CM | POA: Diagnosis not present

## 2021-09-20 DIAGNOSIS — H547 Unspecified visual loss: Secondary | ICD-10-CM | POA: Diagnosis not present

## 2021-09-22 DIAGNOSIS — R5381 Other malaise: Secondary | ICD-10-CM | POA: Diagnosis not present

## 2021-09-26 DIAGNOSIS — H547 Unspecified visual loss: Secondary | ICD-10-CM | POA: Diagnosis not present

## 2021-09-26 DIAGNOSIS — W06XXXD Fall from bed, subsequent encounter: Secondary | ICD-10-CM | POA: Diagnosis not present

## 2021-09-26 DIAGNOSIS — M800AXD Age-related osteoporosis with current pathological fracture, other site, subsequent encounter for fracture with routine healing: Secondary | ICD-10-CM | POA: Diagnosis not present

## 2021-09-26 DIAGNOSIS — H3530 Unspecified macular degeneration: Secondary | ICD-10-CM | POA: Diagnosis not present

## 2021-09-26 DIAGNOSIS — I10 Essential (primary) hypertension: Secondary | ICD-10-CM | POA: Diagnosis not present

## 2021-09-26 DIAGNOSIS — M80052D Age-related osteoporosis with current pathological fracture, left femur, subsequent encounter for fracture with routine healing: Secondary | ICD-10-CM | POA: Diagnosis not present

## 2021-09-26 DIAGNOSIS — M80022D Age-related osteoporosis with current pathological fracture, left humerus, subsequent encounter for fracture with routine healing: Secondary | ICD-10-CM | POA: Diagnosis not present

## 2021-09-27 DIAGNOSIS — H3530 Unspecified macular degeneration: Secondary | ICD-10-CM | POA: Diagnosis not present

## 2021-09-27 DIAGNOSIS — M80052D Age-related osteoporosis with current pathological fracture, left femur, subsequent encounter for fracture with routine healing: Secondary | ICD-10-CM | POA: Diagnosis not present

## 2021-09-27 DIAGNOSIS — H547 Unspecified visual loss: Secondary | ICD-10-CM | POA: Diagnosis not present

## 2021-09-27 DIAGNOSIS — I10 Essential (primary) hypertension: Secondary | ICD-10-CM | POA: Diagnosis not present

## 2021-09-27 DIAGNOSIS — M800AXD Age-related osteoporosis with current pathological fracture, other site, subsequent encounter for fracture with routine healing: Secondary | ICD-10-CM | POA: Diagnosis not present

## 2021-09-27 DIAGNOSIS — W06XXXD Fall from bed, subsequent encounter: Secondary | ICD-10-CM | POA: Diagnosis not present

## 2021-09-27 DIAGNOSIS — M80022D Age-related osteoporosis with current pathological fracture, left humerus, subsequent encounter for fracture with routine healing: Secondary | ICD-10-CM | POA: Diagnosis not present

## 2021-10-03 ENCOUNTER — Ambulatory Visit: Payer: Self-pay

## 2021-10-03 ENCOUNTER — Other Ambulatory Visit: Payer: Self-pay

## 2021-10-03 ENCOUNTER — Telehealth (INDEPENDENT_AMBULATORY_CARE_PROVIDER_SITE_OTHER): Payer: Medicare Other | Admitting: Family Medicine

## 2021-10-03 ENCOUNTER — Encounter: Payer: Self-pay | Admitting: Family Medicine

## 2021-10-03 VITALS — Wt 123.0 lb

## 2021-10-03 DIAGNOSIS — Z9889 Other specified postprocedural states: Secondary | ICD-10-CM

## 2021-10-03 DIAGNOSIS — B853 Phthiriasis: Secondary | ICD-10-CM

## 2021-10-03 DIAGNOSIS — M25552 Pain in left hip: Secondary | ICD-10-CM | POA: Diagnosis not present

## 2021-10-03 DIAGNOSIS — S72002D Fracture of unspecified part of neck of left femur, subsequent encounter for closed fracture with routine healing: Secondary | ICD-10-CM

## 2021-10-03 MED ORDER — PERMETHRIN 5 % EX CREA: TOPICAL_CREAM | CUTANEOUS | 1 refills | Status: DC

## 2021-10-03 MED ORDER — OXYCODONE HCL 5 MG PO TABS: 5.0000 mg | ORAL_TABLET | ORAL | 0 refills | Status: DC | PRN

## 2021-10-03 NOTE — Telephone Encounter (Signed)
Pts husband called and stated that the pt thinks she has Scabies / he stated he went to the drug store and was advise that the Rx the pt needs is Pernethrin / he also mentioned she was almost out of pain meds but didn't know the name and said he would when the nurse calls /please advise   Husband reports pt. Has a scabbed area that "looked like it had pus in it." Pt. Thinks it's scabies. Requests virtual visit. Appointment made for today.   Reason for Disposition  Localized rash present > 7 days  Answer Assessment - Initial Assessment Questions 1. APPEARANCE of RASH: "Describe the rash."      One scab 2. LOCATION: "Where is the rash located?"      Below belly button 3. NUMBER: "How many spots are there?"      1 4. SIZE: "How big are the spots?" (Inches, centimeters or compare to size of a coin)      Quarter-size 5. ONSET: "When did the rash start?"      Last week 6. ITCHING: "Does the rash itch?" If Yes, ask: "How bad is the itch?"  (Scale 0-10; or none, mild, moderate, severe)     No 7. PAIN: "Does the rash hurt?" If Yes, ask: "How bad is the pain?"  (Scale 0-10; or none, mild, moderate, severe)    - NONE (0): no pain    - MILD (1-3): doesn't interfere with normal activities     - MODERATE (4-7): interferes with normal activities or awakens from sleep     - SEVERE (8-10): excruciating pain, unable to do any normal activities     No 8. OTHER SYMPTOMS: "Do you have any other symptoms?" (e.g., fever)     No 9. PREGNANCY: "Is there any chance you are pregnant?" "When was your last menstrual period?"     No  Protocols used: Rash or Redness - Localized-A-AH

## 2021-10-03 NOTE — Patient Instructions (Signed)
° °  Please schedule a Follow-up Appointment to: No follow-ups on file. ° °If you have any other questions or concerns, please feel free to call the office or send a message through MyChart. You may also schedule an earlier appointment if necessary. ° °Additionally, you may be receiving a survey about your experience at our office within a few days to 1 week by e-mail or mail. We value your feedback. ° °Erandi Lemma, DO °[facility] °

## 2021-10-03 NOTE — Progress Notes (Signed)
Virtual Visit via Telephone The purpose of this virtual visit is to provide medical care while limiting exposure to the novel coronavirus (COVID19) for both patient and office staff.  Consent was obtained for phone visit:  Yes.   Answered questions that patient had about telehealth interaction:  Yes.   I discussed the limitations, risks, security and privacy concerns of performing an evaluation and management service by telephone. I also discussed with the patient that there may be a patient responsible charge related to this service. The patient expressed understanding and agreed to proceed.  Patient Location: Home Provider Location: [facility] (Office)  Participants in virtual visit: - Patient: Pamela Ball, husband [name] - CMA: Orinda Kenner, CMA - Provider: Dr Parks Ranger  ---------------------------------------------------------------------- Chief Complaint  Patient presents with   Rash    S: Reviewed CMA documentation. I have called patient and gathered additional HPI as follows:  Pubic Lice Rash  Reports that symptoms started within past 1 week approx, they were worried about scabies or lice, noticed some small lice that they removed in pubic region. She has occasional itching but no other problem.  L Hip Pain S/p L Hip Fracture Followed [facility] On opiate oxycodone PRN pain, about due for next refill within few days, asking about lowering dose gradually.  Denies any fevers, chills, sweats, body ache, cough, shortness of breath, sinus pain or pressure, headache, abdominal pain, diarrhea  Past Medical History:  Diagnosis Date   Abdominal hernia with obstruction and without gangrene    Anxiety 02/15/2017   denies   Asthma    Bipolar 1 disorder, depressed, full remission ([place]) 02/15/2017   Bipolar affective disorder (HCC)    COPD (chronic obstructive pulmonary disease) ([place])    Depression    GERD (gastroesophageal reflux disease) 02/15/2017    Glaucoma    Headache    Hiatal hernia    History of abdominal hernia 05/21/2017   History of compression fracture of spine 02/15/2017   Hyperlipidemia    Hypertension    Incisional hernia    Incisional ventral hernia w obstruction 06/02/2017   Normocytic anemia 05/23/2017   Osteoarthritis of knees, 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   Presbycusis of both ears 02/15/2017   Social History   Tobacco Use   Smoking status: Never   Smokeless tobacco: Never  Vaping Use   Vaping Use: Never used  Substance Use Topics   Alcohol use: No    Comment: drink occasionally    Drug use: No    Current Outpatient Medications:    albuterol (VENTOLIN HFA) 108 (90 Base) MCG/ACT inhaler, INHALE 2 PUFFS INTO THE LUNGS EVERY 4 HOURS AS NEEDED FOR WHEEZING OR SHORTNESS OF BREATH (COUGH) (Patient taking differently: Inhale 1-2 puffs into the lungs every 4 (four) hours as needed for shortness of breath or wheezing.), Disp: 8.5 g, Rfl: 1   alendronate (FOSAMAX) 70 MG tablet, Take by mouth. Take 1 tablet (70 mg total) by mouth every 7 (seven) days Take with a full glass of water. Do not lie down for the next 30 min., Disp: , Rfl:    ALPRAZolam (XANAX) 0.5 MG tablet, Take 1 tablet (0.5 mg total) by mouth 2 (two) times daily as needed for anxiety., Disp: 30 tablet, Rfl: 0   amLODipine (NORVASC) 10 MG tablet, TAKE 1 TABLET BY MOUTH EVERY DAY, Disp: 90 tablet, Rfl: 3   bisacodyl (DULCOLAX) 10 MG suppository, Place 1 suppository (10 mg total) rectally daily as needed for moderate constipation., Disp:  12 suppository, Rfl: 0   buPROPion (WELLBUTRIN XL) 150 MG 24 hr tablet, Take 150 mg by mouth daily., Disp: , Rfl:    busPIRone (BUSPAR) 10 MG tablet, Take 10 mg by mouth daily. Prescribed by Psychiatry Dr Kasandra Knudsen, Disp: , Rfl:    Calcium Citrate-Vitamin D (CALCIUM + D PO), Take 1 tablet by mouth daily., Disp: , Rfl:    Cholecalciferol (VITAMIN D3) 50 MCG (2000 UT) TABS, Take 2,000 Units by mouth daily., Disp: , Rfl:    Cyanocobalamin (VITAMIN  B-12) 3000 MCG SUBL, Take 3,000 mcg by mouth daily., Disp: , Rfl:    docusate sodium (COLACE) 100 MG capsule, Take 1 capsule (100 mg total) by mouth 2 (two) times daily., Disp: 10 capsule, Rfl: 0   feeding supplement (ENSURE ENLIVE / ENSURE PLUS) LIQD, Take 237 mLs by mouth 3 (three) times daily between meals., Disp: 237 mL, Rfl: 12   Ferrous Fumarate (IRON) 18 MG TBCR, Take 18 mg by mouth daily. W/Rice Protein, Disp: , Rfl:    Multiple Vitamin (MULTIVITAMIN WITH MINERALS) TABS tablet, Take 1 tablet by mouth daily., Disp: , Rfl:    Multiple Vitamins-Minerals (PRESERVISION AREDS) TABS, Take 1 tablet by mouth daily., Disp: , Rfl:    OLANZapine (ZYPREXA) 7.5 MG tablet, Take 7.5 mg by mouth daily., Disp: , Rfl:    omeprazole (PRILOSEC) 20 MG capsule, TAKE 1 CAPSULE BY MOUTH DAILY BEFORE BREAKFAST., Disp: 90 capsule, Rfl: 1   ondansetron (ZOFRAN-ODT) 4 MG disintegrating tablet, TAKE 1 TABLET BY MOUTH EVERY 8 HOURS AS NEEDED FOR NAUSEA AND VOMITING, Disp: 30 tablet, Rfl: 2   permethrin (ELIMITE) 5 % cream, Apply cream to affected area, leave on for 10 minutes, then rinse and wash off. May repeat in 14 days if needed., Disp: 60 g, Rfl: 1   Probiotic Product (ACIDOPHILUS) CHEW, Chew by mouth., Disp: , Rfl:    simvastatin (ZOCOR) 20 MG tablet, Take 1 tablet (20 mg total) by mouth daily., Disp: 90 tablet, Rfl: 1   venlafaxine XR (EFFEXOR-XR) 75 MG 24 hr capsule, Take 75 mg by mouth daily with breakfast. , Disp: , Rfl:    vitamin B-12 1000 MCG tablet, Take 3 tablets (3,000 mcg total) by mouth daily., Disp: , Rfl:    vitamin C (ASCORBIC ACID) 500 MG tablet, Take 500 mg by mouth daily., Disp: , Rfl:    enoxaparin (LOVENOX) 40 MG/0.4ML injection, Inject 0.4 mLs (40 mg total) into the skin daily for 14 days., Disp: 5.6 mL, Rfl: 0   oxyCODONE (OXY IR/ROXICODONE) 5 MG immediate release tablet, Take 1 tablet (5 mg total) by mouth every 3 (three) hours as needed for moderate pain., Disp: 90 tablet, Rfl: 0  Depression  screen [facility] 2/9 02/08/2021 06/28/2020 06/01/2020  Decreased Interest 0 0 0  Down, Depressed, Hopeless 0 0 0  PHQ - 2 Score 0 0 0  Altered sleeping - 2 1  Tired, decreased energy - 3 2  Change in appetite - 0 0  Feeling bad or failure about yourself  - 0 0  Trouble concentrating - 0 2  Moving slowly or fidgety/restless - 0 1  Suicidal thoughts - 0 0  PHQ-9 Score - 5 6  Difficult doing work/chores - Not difficult at all Not difficult at all  Some recent data might be hidden    GAD 7 : Generalized Anxiety Score 06/28/2020 10/22/2019  Nervous, Anxious, on Edge 0 3  Control/stop worrying 0 1  Worry too much - different things 1  2  Trouble relaxing 3 2  Restless 3 3  Easily annoyed or irritable 0 1  Afraid - awful might happen 0 1  Total GAD 7 Score 7 13  Anxiety Difficulty Somewhat difficult Not difficult at all    -------------------------------------------------------------------------- O: No physical exam performed due to remote telephone encounter.  Lab results reviewed.  No results found for this or any previous visit (from the past 2160 hour(s)).  -------------------------------------------------------------------------- A&P:  Problem List Items Addressed This Visit   None Visit Diagnoses     Pubic lice    -  Primary   Relevant Medications   permethrin (ELIMITE) 5 % cream   Closed fracture of left hip with routine healing, subsequent encounter       Relevant Medications   oxyCODONE (OXY IR/ROXICODONE) 5 MG immediate release tablet   Left hip pain       Relevant Medications   oxyCODONE (OXY IR/ROXICODONE) 5 MG immediate release tablet   Status post hip surgery       Relevant Medications   oxyCODONE (OXY IR/ROXICODONE) 5 MG immediate release tablet      Pubic Lice vs Scabies Description of symptoms seem more localized to pubic lice, she says obtained from public bathroom. Has seen lice and removed some. They were asking about whole body scabies rash and itching  symptoms that are questionable if they are linked to scabies or not.  Will cover and treat with Permethrin cream application can do localized to pubic region since whole body symptoms not consistent with scabies. May repeat treatment if indicated.  Chronic L Hip Pain S/p L Hip fracture s/p surgical intervention Discussed dosing on Oxycodone PRN, and goal now to taper down dosage, not use 90 pills over 30 days, reduce from closer to TID dosing, will re order once more at 90 pills with plan for them to self taper down to lower dosage, and work with Ortho going forward, they did not return.  Meds ordered this encounter  Medications   permethrin (ELIMITE) 5 % cream    Sig: Apply cream to affected area, leave on for 10 minutes, then rinse and wash off. May repeat in 14 days if needed.    Dispense:  60 g    Refill:  1   oxyCODONE (OXY IR/ROXICODONE) 5 MG immediate release tablet    Sig: Take 1 tablet (5 mg total) by mouth every 3 (three) hours as needed for moderate pain.    Dispense:  90 tablet    Refill:  0    Follow-up: - Return in 1-3 months for pain med refill  Patient verbalizes understanding with the above medical recommendations including the limitation of remote medical advice.  Specific follow-up and call-back criteria were given for patient to follow-up or seek medical care more urgently if needed.   - Time spent in direct consultation with patient on phone: 9 minutes   [name], [facility] 10/03/2021, 11:58 AM

## 2021-10-06 DIAGNOSIS — I10 Essential (primary) hypertension: Secondary | ICD-10-CM | POA: Diagnosis not present

## 2021-10-06 DIAGNOSIS — M800AXD Age-related osteoporosis with current pathological fracture, other site, subsequent encounter for fracture with routine healing: Secondary | ICD-10-CM | POA: Diagnosis not present

## 2021-10-06 DIAGNOSIS — H547 Unspecified visual loss: Secondary | ICD-10-CM | POA: Diagnosis not present

## 2021-10-06 DIAGNOSIS — W06XXXD Fall from bed, subsequent encounter: Secondary | ICD-10-CM | POA: Diagnosis not present

## 2021-10-06 DIAGNOSIS — M80052D Age-related osteoporosis with current pathological fracture, left femur, subsequent encounter for fracture with routine healing: Secondary | ICD-10-CM | POA: Diagnosis not present

## 2021-10-06 DIAGNOSIS — H3530 Unspecified macular degeneration: Secondary | ICD-10-CM | POA: Diagnosis not present

## 2021-10-06 DIAGNOSIS — M80022D Age-related osteoporosis with current pathological fracture, left humerus, subsequent encounter for fracture with routine healing: Secondary | ICD-10-CM | POA: Diagnosis not present

## 2021-10-07 ENCOUNTER — Telehealth: Payer: Self-pay | Admitting: Family Medicine

## 2021-10-07 DIAGNOSIS — H3530 Unspecified macular degeneration: Secondary | ICD-10-CM | POA: Diagnosis not present

## 2021-10-07 DIAGNOSIS — W06XXXD Fall from bed, subsequent encounter: Secondary | ICD-10-CM | POA: Diagnosis not present

## 2021-10-07 DIAGNOSIS — M800AXD Age-related osteoporosis with current pathological fracture, other site, subsequent encounter for fracture with routine healing: Secondary | ICD-10-CM | POA: Diagnosis not present

## 2021-10-07 DIAGNOSIS — I10 Essential (primary) hypertension: Secondary | ICD-10-CM | POA: Diagnosis not present

## 2021-10-07 DIAGNOSIS — M80022D Age-related osteoporosis with current pathological fracture, left humerus, subsequent encounter for fracture with routine healing: Secondary | ICD-10-CM | POA: Diagnosis not present

## 2021-10-07 DIAGNOSIS — H547 Unspecified visual loss: Secondary | ICD-10-CM | POA: Diagnosis not present

## 2021-10-07 DIAGNOSIS — M80052D Age-related osteoporosis with current pathological fracture, left femur, subsequent encounter for fracture with routine healing: Secondary | ICD-10-CM | POA: Diagnosis not present

## 2021-10-07 NOTE — Telephone Encounter (Signed)
Returned [employer], no answer.

## 2021-10-07 NOTE — Telephone Encounter (Signed)
[facility] for OT as requested

## 2021-10-07 NOTE — Telephone Encounter (Signed)
Home Health Verbal Orders - Caller/Agency: [facility] Number: [phone]  Requesting OT/PT/Skilled Nursing/Social Work/Speech Therapy: OT  Frequency: 2X5,1Z0 every other.

## 2021-10-10 DIAGNOSIS — M80022D Age-related osteoporosis with current pathological fracture, left humerus, subsequent encounter for fracture with routine healing: Secondary | ICD-10-CM | POA: Diagnosis not present

## 2021-10-10 DIAGNOSIS — I10 Essential (primary) hypertension: Secondary | ICD-10-CM | POA: Diagnosis not present

## 2021-10-10 DIAGNOSIS — H547 Unspecified visual loss: Secondary | ICD-10-CM | POA: Diagnosis not present

## 2021-10-10 DIAGNOSIS — H3530 Unspecified macular degeneration: Secondary | ICD-10-CM | POA: Diagnosis not present

## 2021-10-10 DIAGNOSIS — W06XXXD Fall from bed, subsequent encounter: Secondary | ICD-10-CM | POA: Diagnosis not present

## 2021-10-10 DIAGNOSIS — M80052D Age-related osteoporosis with current pathological fracture, left femur, subsequent encounter for fracture with routine healing: Secondary | ICD-10-CM | POA: Diagnosis not present

## 2021-10-10 DIAGNOSIS — M800AXD Age-related osteoporosis with current pathological fracture, other site, subsequent encounter for fracture with routine healing: Secondary | ICD-10-CM | POA: Diagnosis not present

## 2021-10-12 DIAGNOSIS — M80022D Age-related osteoporosis with current pathological fracture, left humerus, subsequent encounter for fracture with routine healing: Secondary | ICD-10-CM | POA: Diagnosis not present

## 2021-10-12 DIAGNOSIS — H3530 Unspecified macular degeneration: Secondary | ICD-10-CM | POA: Diagnosis not present

## 2021-10-12 DIAGNOSIS — M800AXD Age-related osteoporosis with current pathological fracture, other site, subsequent encounter for fracture with routine healing: Secondary | ICD-10-CM | POA: Diagnosis not present

## 2021-10-12 DIAGNOSIS — I10 Essential (primary) hypertension: Secondary | ICD-10-CM | POA: Diagnosis not present

## 2021-10-12 DIAGNOSIS — M80052D Age-related osteoporosis with current pathological fracture, left femur, subsequent encounter for fracture with routine healing: Secondary | ICD-10-CM | POA: Diagnosis not present

## 2021-10-12 DIAGNOSIS — H547 Unspecified visual loss: Secondary | ICD-10-CM | POA: Diagnosis not present

## 2021-10-12 DIAGNOSIS — W06XXXD Fall from bed, subsequent encounter: Secondary | ICD-10-CM | POA: Diagnosis not present

## 2021-10-14 DIAGNOSIS — W06XXXD Fall from bed, subsequent encounter: Secondary | ICD-10-CM | POA: Diagnosis not present

## 2021-10-14 DIAGNOSIS — M80022D Age-related osteoporosis with current pathological fracture, left humerus, subsequent encounter for fracture with routine healing: Secondary | ICD-10-CM | POA: Diagnosis not present

## 2021-10-14 DIAGNOSIS — I10 Essential (primary) hypertension: Secondary | ICD-10-CM | POA: Diagnosis not present

## 2021-10-14 DIAGNOSIS — M800AXD Age-related osteoporosis with current pathological fracture, other site, subsequent encounter for fracture with routine healing: Secondary | ICD-10-CM | POA: Diagnosis not present

## 2021-10-14 DIAGNOSIS — H3530 Unspecified macular degeneration: Secondary | ICD-10-CM | POA: Diagnosis not present

## 2021-10-14 DIAGNOSIS — H547 Unspecified visual loss: Secondary | ICD-10-CM | POA: Diagnosis not present

## 2021-10-14 DIAGNOSIS — M80052D Age-related osteoporosis with current pathological fracture, left femur, subsequent encounter for fracture with routine healing: Secondary | ICD-10-CM | POA: Diagnosis not present

## 2021-10-17 DIAGNOSIS — M80022D Age-related osteoporosis with current pathological fracture, left humerus, subsequent encounter for fracture with routine healing: Secondary | ICD-10-CM | POA: Diagnosis not present

## 2021-10-17 DIAGNOSIS — I10 Essential (primary) hypertension: Secondary | ICD-10-CM | POA: Diagnosis not present

## 2021-10-17 DIAGNOSIS — M800AXD Age-related osteoporosis with current pathological fracture, other site, subsequent encounter for fracture with routine healing: Secondary | ICD-10-CM | POA: Diagnosis not present

## 2021-10-17 DIAGNOSIS — W06XXXD Fall from bed, subsequent encounter: Secondary | ICD-10-CM | POA: Diagnosis not present

## 2021-10-17 DIAGNOSIS — H3530 Unspecified macular degeneration: Secondary | ICD-10-CM | POA: Diagnosis not present

## 2021-10-17 DIAGNOSIS — M80052D Age-related osteoporosis with current pathological fracture, left femur, subsequent encounter for fracture with routine healing: Secondary | ICD-10-CM | POA: Diagnosis not present

## 2021-10-17 DIAGNOSIS — H547 Unspecified visual loss: Secondary | ICD-10-CM | POA: Diagnosis not present

## 2021-10-18 DIAGNOSIS — M80052D Age-related osteoporosis with current pathological fracture, left femur, subsequent encounter for fracture with routine healing: Secondary | ICD-10-CM | POA: Diagnosis not present

## 2021-10-18 DIAGNOSIS — I10 Essential (primary) hypertension: Secondary | ICD-10-CM | POA: Diagnosis not present

## 2021-10-18 DIAGNOSIS — M800AXD Age-related osteoporosis with current pathological fracture, other site, subsequent encounter for fracture with routine healing: Secondary | ICD-10-CM | POA: Diagnosis not present

## 2021-10-18 DIAGNOSIS — H3530 Unspecified macular degeneration: Secondary | ICD-10-CM | POA: Diagnosis not present

## 2021-10-18 DIAGNOSIS — M80022D Age-related osteoporosis with current pathological fracture, left humerus, subsequent encounter for fracture with routine healing: Secondary | ICD-10-CM | POA: Diagnosis not present

## 2021-10-18 DIAGNOSIS — H547 Unspecified visual loss: Secondary | ICD-10-CM | POA: Diagnosis not present

## 2021-10-18 DIAGNOSIS — W06XXXD Fall from bed, subsequent encounter: Secondary | ICD-10-CM | POA: Diagnosis not present

## 2021-10-20 DIAGNOSIS — I10 Essential (primary) hypertension: Secondary | ICD-10-CM | POA: Diagnosis not present

## 2021-10-20 DIAGNOSIS — M80052D Age-related osteoporosis with current pathological fracture, left femur, subsequent encounter for fracture with routine healing: Secondary | ICD-10-CM | POA: Diagnosis not present

## 2021-10-20 DIAGNOSIS — W06XXXD Fall from bed, subsequent encounter: Secondary | ICD-10-CM | POA: Diagnosis not present

## 2021-10-20 DIAGNOSIS — H547 Unspecified visual loss: Secondary | ICD-10-CM | POA: Diagnosis not present

## 2021-10-20 DIAGNOSIS — H3530 Unspecified macular degeneration: Secondary | ICD-10-CM | POA: Diagnosis not present

## 2021-10-20 DIAGNOSIS — M800AXD Age-related osteoporosis with current pathological fracture, other site, subsequent encounter for fracture with routine healing: Secondary | ICD-10-CM | POA: Diagnosis not present

## 2021-10-20 DIAGNOSIS — M80022D Age-related osteoporosis with current pathological fracture, left humerus, subsequent encounter for fracture with routine healing: Secondary | ICD-10-CM | POA: Diagnosis not present

## 2021-10-22 DIAGNOSIS — R5381 Other malaise: Secondary | ICD-10-CM | POA: Diagnosis not present

## 2021-10-24 DIAGNOSIS — M80052D Age-related osteoporosis with current pathological fracture, left femur, subsequent encounter for fracture with routine healing: Secondary | ICD-10-CM | POA: Diagnosis not present

## 2021-10-24 DIAGNOSIS — W06XXXD Fall from bed, subsequent encounter: Secondary | ICD-10-CM | POA: Diagnosis not present

## 2021-10-24 DIAGNOSIS — H3530 Unspecified macular degeneration: Secondary | ICD-10-CM | POA: Diagnosis not present

## 2021-10-24 DIAGNOSIS — M80022D Age-related osteoporosis with current pathological fracture, left humerus, subsequent encounter for fracture with routine healing: Secondary | ICD-10-CM | POA: Diagnosis not present

## 2021-10-24 DIAGNOSIS — M800AXD Age-related osteoporosis with current pathological fracture, other site, subsequent encounter for fracture with routine healing: Secondary | ICD-10-CM | POA: Diagnosis not present

## 2021-10-24 DIAGNOSIS — H547 Unspecified visual loss: Secondary | ICD-10-CM | POA: Diagnosis not present

## 2021-10-24 DIAGNOSIS — I10 Essential (primary) hypertension: Secondary | ICD-10-CM | POA: Diagnosis not present

## 2021-10-25 DIAGNOSIS — M800AXD Age-related osteoporosis with current pathological fracture, other site, subsequent encounter for fracture with routine healing: Secondary | ICD-10-CM | POA: Diagnosis not present

## 2021-10-25 DIAGNOSIS — W06XXXD Fall from bed, subsequent encounter: Secondary | ICD-10-CM | POA: Diagnosis not present

## 2021-10-25 DIAGNOSIS — M80052D Age-related osteoporosis with current pathological fracture, left femur, subsequent encounter for fracture with routine healing: Secondary | ICD-10-CM | POA: Diagnosis not present

## 2021-10-25 DIAGNOSIS — H3530 Unspecified macular degeneration: Secondary | ICD-10-CM | POA: Diagnosis not present

## 2021-10-25 DIAGNOSIS — H547 Unspecified visual loss: Secondary | ICD-10-CM | POA: Diagnosis not present

## 2021-10-25 DIAGNOSIS — M80022D Age-related osteoporosis with current pathological fracture, left humerus, subsequent encounter for fracture with routine healing: Secondary | ICD-10-CM | POA: Diagnosis not present

## 2021-10-25 DIAGNOSIS — I10 Essential (primary) hypertension: Secondary | ICD-10-CM | POA: Diagnosis not present

## 2021-10-28 DIAGNOSIS — M800AXD Age-related osteoporosis with current pathological fracture, other site, subsequent encounter for fracture with routine healing: Secondary | ICD-10-CM | POA: Diagnosis not present

## 2021-10-28 DIAGNOSIS — M80022D Age-related osteoporosis with current pathological fracture, left humerus, subsequent encounter for fracture with routine healing: Secondary | ICD-10-CM | POA: Diagnosis not present

## 2021-10-28 DIAGNOSIS — W06XXXD Fall from bed, subsequent encounter: Secondary | ICD-10-CM | POA: Diagnosis not present

## 2021-10-28 DIAGNOSIS — H547 Unspecified visual loss: Secondary | ICD-10-CM | POA: Diagnosis not present

## 2021-10-28 DIAGNOSIS — I10 Essential (primary) hypertension: Secondary | ICD-10-CM | POA: Diagnosis not present

## 2021-10-28 DIAGNOSIS — H3530 Unspecified macular degeneration: Secondary | ICD-10-CM | POA: Diagnosis not present

## 2021-10-28 DIAGNOSIS — M80052D Age-related osteoporosis with current pathological fracture, left femur, subsequent encounter for fracture with routine healing: Secondary | ICD-10-CM | POA: Diagnosis not present

## 2021-10-31 DIAGNOSIS — H3530 Unspecified macular degeneration: Secondary | ICD-10-CM | POA: Diagnosis not present

## 2021-10-31 DIAGNOSIS — W06XXXD Fall from bed, subsequent encounter: Secondary | ICD-10-CM | POA: Diagnosis not present

## 2021-10-31 DIAGNOSIS — M800AXD Age-related osteoporosis with current pathological fracture, other site, subsequent encounter for fracture with routine healing: Secondary | ICD-10-CM | POA: Diagnosis not present

## 2021-10-31 DIAGNOSIS — I10 Essential (primary) hypertension: Secondary | ICD-10-CM | POA: Diagnosis not present

## 2021-10-31 DIAGNOSIS — M80052D Age-related osteoporosis with current pathological fracture, left femur, subsequent encounter for fracture with routine healing: Secondary | ICD-10-CM | POA: Diagnosis not present

## 2021-10-31 DIAGNOSIS — H547 Unspecified visual loss: Secondary | ICD-10-CM | POA: Diagnosis not present

## 2021-10-31 DIAGNOSIS — M80022D Age-related osteoporosis with current pathological fracture, left humerus, subsequent encounter for fracture with routine healing: Secondary | ICD-10-CM | POA: Diagnosis not present

## 2021-11-01 DIAGNOSIS — H3530 Unspecified macular degeneration: Secondary | ICD-10-CM | POA: Diagnosis not present

## 2021-11-01 DIAGNOSIS — H547 Unspecified visual loss: Secondary | ICD-10-CM | POA: Diagnosis not present

## 2021-11-01 DIAGNOSIS — M800AXD Age-related osteoporosis with current pathological fracture, other site, subsequent encounter for fracture with routine healing: Secondary | ICD-10-CM | POA: Diagnosis not present

## 2021-11-01 DIAGNOSIS — M80052D Age-related osteoporosis with current pathological fracture, left femur, subsequent encounter for fracture with routine healing: Secondary | ICD-10-CM | POA: Diagnosis not present

## 2021-11-01 DIAGNOSIS — M80022D Age-related osteoporosis with current pathological fracture, left humerus, subsequent encounter for fracture with routine healing: Secondary | ICD-10-CM | POA: Diagnosis not present

## 2021-11-01 DIAGNOSIS — W06XXXD Fall from bed, subsequent encounter: Secondary | ICD-10-CM | POA: Diagnosis not present

## 2021-11-01 DIAGNOSIS — I10 Essential (primary) hypertension: Secondary | ICD-10-CM | POA: Diagnosis not present

## 2021-11-02 ENCOUNTER — Ambulatory Visit
Admission: RE | Admit: 2021-11-02 | Discharge: 2021-11-02 | Disposition: A | Discharge status: Home / Self Care | Payer: Medicare Other | Source: Ambulatory Visit | Attending: Internal Medicine | Admitting: Internal Medicine

## 2021-11-02 ENCOUNTER — Ambulatory Visit
Admission: RE | Admit: 2021-11-02 | Discharge: 2021-11-02 | Disposition: A | Discharge status: Home / Self Care | Payer: Medicare Other | Attending: Internal Medicine | Admitting: Internal Medicine

## 2021-11-02 ENCOUNTER — Encounter: Payer: Self-pay | Admitting: Internal Medicine

## 2021-11-02 ENCOUNTER — Other Ambulatory Visit: Payer: Self-pay

## 2021-11-02 ENCOUNTER — Ambulatory Visit (INDEPENDENT_AMBULATORY_CARE_PROVIDER_SITE_OTHER): Payer: Medicare Other | Admitting: Internal Medicine

## 2021-11-02 ENCOUNTER — Other Ambulatory Visit: Payer: Self-pay | Admitting: Family Medicine

## 2021-11-02 VITALS — BP 127/54 | HR 100 | Temp 98.6°F | Resp 19

## 2021-11-02 DIAGNOSIS — S42212A Unspecified displaced fracture of surgical neck of left humerus, initial encounter for closed fracture: Secondary | ICD-10-CM | POA: Diagnosis not present

## 2021-11-02 DIAGNOSIS — H547 Unspecified visual loss: Secondary | ICD-10-CM | POA: Diagnosis not present

## 2021-11-02 DIAGNOSIS — M80052D Age-related osteoporosis with current pathological fracture, left femur, subsequent encounter for fracture with routine healing: Secondary | ICD-10-CM | POA: Diagnosis not present

## 2021-11-02 DIAGNOSIS — Z8781 Personal history of (healed) traumatic fracture: Secondary | ICD-10-CM

## 2021-11-02 DIAGNOSIS — G8929 Other chronic pain: Secondary | ICD-10-CM

## 2021-11-02 DIAGNOSIS — S72002D Fracture of unspecified part of neck of left femur, subsequent encounter for closed fracture with routine healing: Secondary | ICD-10-CM

## 2021-11-02 DIAGNOSIS — H3530 Unspecified macular degeneration: Secondary | ICD-10-CM | POA: Diagnosis not present

## 2021-11-02 DIAGNOSIS — M800AXD Age-related osteoporosis with current pathological fracture, other site, subsequent encounter for fracture with routine healing: Secondary | ICD-10-CM | POA: Diagnosis not present

## 2021-11-02 DIAGNOSIS — M80022D Age-related osteoporosis with current pathological fracture, left humerus, subsequent encounter for fracture with routine healing: Secondary | ICD-10-CM | POA: Diagnosis not present

## 2021-11-02 DIAGNOSIS — M25512 Pain in left shoulder: Secondary | ICD-10-CM | POA: Insufficient documentation

## 2021-11-02 DIAGNOSIS — W06XXXD Fall from bed, subsequent encounter: Secondary | ICD-10-CM | POA: Diagnosis not present

## 2021-11-02 DIAGNOSIS — I10 Essential (primary) hypertension: Secondary | ICD-10-CM | POA: Diagnosis not present

## 2021-11-02 DIAGNOSIS — Z9889 Other specified postprocedural states: Secondary | ICD-10-CM

## 2021-11-02 DIAGNOSIS — M25552 Pain in left hip: Secondary | ICD-10-CM

## 2021-11-02 MED ORDER — OXYCODONE HCL 5 MG PO TABS: 5.0000 mg | ORAL_TABLET | ORAL | 0 refills | Status: DC | PRN

## 2021-11-02 NOTE — Progress Notes (Signed)
Subjective:    Patient ID: Pamela Ball, female    DOB: Mar 13, 1946, 75 y.o.   MRN: [id number]  HPI  Pt presents to the clinic today with c/o left shoulder pain. This started 6 months ago after an injury in which she fell, broke her left shoulder. She describes the pain as sharp. The pain does not radiate, she denies numbness and tingling. She has taken Oxycodone with some relief of symptoms. She is currently seeing PT/OT.  She reports the physical therapist is concerned that she may have frozen shoulder.  They did recommend follow up with orthopedics, she currently sees Dr. Rudene Christians.  Review of Systems     Past Medical History:  Diagnosis Date   Abdominal hernia with obstruction and without gangrene    Anxiety 02/15/2017   denies   Asthma    Bipolar 1 disorder, depressed, full remission ([place]) 02/15/2017   Bipolar affective disorder (HCC)    COPD (chronic obstructive pulmonary disease) (HCC)    Depression    GERD (gastroesophageal reflux disease) 02/15/2017   Glaucoma    Headache    Hiatal hernia    History of abdominal hernia 05/21/2017   History of compression fracture of spine 02/15/2017   Hyperlipidemia    Hypertension    Incisional hernia    Incisional ventral hernia w obstruction 06/02/2017   Normocytic anemia 05/23/2017   Osteoarthritis of knees, 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   Presbycusis of both ears 02/15/2017    Current Outpatient Medications  Medication Sig Dispense Refill   albuterol (VENTOLIN HFA) 108 (90 Base) MCG/ACT inhaler INHALE 2 PUFFS INTO THE LUNGS EVERY 4 HOURS AS NEEDED FOR WHEEZING OR SHORTNESS OF BREATH (COUGH) (Patient taking differently: Inhale 1-2 puffs into the lungs every 4 (four) hours as needed for shortness of breath or wheezing.) 8.5 g 1   alendronate (FOSAMAX) 70 MG tablet Take by mouth. Take 1 tablet (70 mg total) by mouth every 7 (seven) days Take with a full glass of water. Do not lie down for the next 30 min.     ALPRAZolam (XANAX) 0.5 MG tablet Take 1  tablet (0.5 mg total) by mouth 2 (two) times daily as needed for anxiety. 30 tablet 0   amLODipine (NORVASC) 10 MG tablet TAKE 1 TABLET BY MOUTH EVERY DAY 90 tablet 3   bisacodyl (DULCOLAX) 10 MG suppository Place 1 suppository (10 mg total) rectally daily as needed for moderate constipation. 12 suppository 0   buPROPion (WELLBUTRIN XL) 150 MG 24 hr tablet Take 150 mg by mouth daily.     busPIRone (BUSPAR) 10 MG tablet Take 10 mg by mouth daily. Prescribed by Psychiatry Dr Kasandra Knudsen     Calcium Citrate-Vitamin D (CALCIUM + D PO) Take 1 tablet by mouth daily.     Cholecalciferol (VITAMIN D3) 50 MCG (2000 UT) TABS Take 2,000 Units by mouth daily.     Cyanocobalamin (VITAMIN B-12) 3000 MCG SUBL Take 3,000 mcg by mouth daily.     docusate sodium (COLACE) 100 MG capsule Take 1 capsule (100 mg total) by mouth 2 (two) times daily. 10 capsule 0   enoxaparin (LOVENOX) 40 MG/0.4ML injection Inject 0.4 mLs (40 mg total) into the skin daily for 14 days. 5.6 mL 0   feeding supplement (ENSURE ENLIVE / ENSURE PLUS) LIQD Take 237 mLs by mouth 3 (three) times daily between meals. 237 mL 12   Ferrous Fumarate (IRON) 18 MG TBCR Take 18 mg by mouth daily. W/Rice Protein     Multiple  Vitamin (MULTIVITAMIN WITH MINERALS) TABS tablet Take 1 tablet by mouth daily.     Multiple Vitamins-Minerals (PRESERVISION AREDS) TABS Take 1 tablet by mouth daily.     OLANZapine (ZYPREXA) 7.5 MG tablet Take 7.5 mg by mouth daily.     omeprazole (PRILOSEC) 20 MG capsule TAKE 1 CAPSULE BY MOUTH DAILY BEFORE BREAKFAST. 90 capsule 1   ondansetron (ZOFRAN-ODT) 4 MG disintegrating tablet TAKE 1 TABLET BY MOUTH EVERY 8 HOURS AS NEEDED FOR NAUSEA AND VOMITING 30 tablet 2   oxyCODONE (OXY IR/ROXICODONE) 5 MG immediate release tablet Take 1 tablet (5 mg total) by mouth every 3 (three) hours as needed for moderate pain. 90 tablet 0   permethrin (ELIMITE) 5 % cream Apply cream to affected area, leave on for 10 minutes, then rinse and wash off. May repeat  in 14 days if needed. 60 g 1   Probiotic Product (ACIDOPHILUS) CHEW Chew by mouth.     simvastatin (ZOCOR) 20 MG tablet Take 1 tablet (20 mg total) by mouth daily. 90 tablet 1   venlafaxine XR (EFFEXOR-XR) 75 MG 24 hr capsule Take 75 mg by mouth daily with breakfast.      vitamin B-12 1000 MCG tablet Take 3 tablets (3,000 mcg total) by mouth daily.     vitamin C (ASCORBIC ACID) 500 MG tablet Take 500 mg by mouth daily.     No current facility-administered medications for this visit.    No Known Allergies  Family History  Problem Relation Age of Onset   Cancer Mother        cancer   Breast cancer Mother 55   Hypertension Father     Social History   Socioeconomic History   Marital status: Married    Spouse name: Paitlyn Mcclatchey   Number of children: Not on file   Years of education: High School   Highest education level: Not on file  Occupational History   Occupation: retired  Tobacco Use   Smoking status: Never   Smokeless tobacco: Never  Vaping Use   Vaping Use: Never used  Substance and Sexual Activity   Alcohol use: No    Comment: drink occasionally    Drug use: No   Sexual activity: Not Currently  Other Topics Concern   Not on file  Social History Narrative   Not on file   Social Determinants of Health   Financial Resource Strain: Low Risk    Difficulty of Paying Living Expenses: Not hard at all  Food Insecurity: No Food Insecurity   Worried About Charity fundraiser in the Last Year: Never true   [place] in the Last Year: Never true  Transportation Needs: No Transportation Needs   Lack of Transportation (Medical): No   Lack of Transportation (Non-Medical): No  Physical Activity: Inactive   Days of Exercise per Week: 0 days   Minutes of Exercise per Session: 0 min  Stress: No Stress Concern Present   Feeling of Stress : Not at all  Social Connections: Not on file  Intimate Partner Violence: Not on file     Constitutional: Denies fever,  malaise, fatigue, headache or abrupt weight changes.  Respiratory: Denies difficulty breathing, shortness of breath, cough or sputum production.   Cardiovascular: Denies chest pain, chest tightness, palpitations or swelling in the hands or feet.  Musculoskeletal: Pt reports shoulder pain, decrease in range of motion and difficulty with gait. Denies muscle pain or joint swelling.  Skin:  Neurological: Denies numbness or  tingling of her left upper extremity.   No other specific complaints in a complete review of systems (except as listed in HPI above).  Objective:   Physical Exam BP (!) 127/54 (BP Location: Right Arm, Patient Position: Sitting, Cuff Size: Small)    Pulse 100    Temp 98.6 F (37 C) (Temporal)    Resp 19    SpO2 96%   Wt Readings from Last 3 Encounters:  10/03/21 123 lb (55.8 kg)  09/06/21 123 lb (55.8 kg)  08/02/21 123 lb (55.8 kg)    General: Appears her stated age, frail, in NAD. Skin: Warm, dry and intact.  Cardiovascular: Tachycardic with normal rhythm. Radial pulse 2+ on the left. Pulmonary/Chest: Normal effort and positive vesicular breath sounds.  Musculoskeletal: Decreased abduction, adduction, internal and external rotation of the left shoulder. No pain with palpation of the left shoulder. Strength 2/5 LUE. In wheelchair. Neurological: Alert and oriented.    BMET    Component Value Date/Time   NA 138 06/24/2021 0346   NA 138 02/08/2015 1724   K 4.7 06/27/2021 1149   K 3.7 02/08/2015 1724   CL 103 06/24/2021 0346   CL 101 02/08/2015 1724   CO2 27 06/24/2021 0346   CO2 28 02/08/2015 1724   GLUCOSE 177 (H) 06/24/2021 0346   GLUCOSE 113 (H) 02/08/2015 1724   BUN 11 06/24/2021 0346   BUN 9 02/08/2015 1724   CREATININE 0.55 06/24/2021 0346   CREATININE 0.59 (L) 06/28/2020 0927   CALCIUM 7.5 (L) 06/24/2021 0346   CALCIUM 9.2 02/08/2015 1724   GFRNONAA >60 06/24/2021 0346   GFRNONAA 90 06/28/2020 0927   GFRAA 105 06/28/2020 0927    Lipid Panel      Component Value Date/Time   CHOL 215 (H) 06/28/2020 0927   TRIG 125 06/28/2020 0927   HDL 77 06/28/2020 0927   CHOLHDL 2.8 06/28/2020 0927   VLDL 14 09/26/2018 1249   LDLCALC 114 (H) 06/28/2020 0927    CBC    Component Value Date/Time   WBC 3.9 (L) 06/27/2021 1149   RBC 3.58 (L) 06/27/2021 1149   HGB 11.6 (L) 06/27/2021 1149   HGB 14.1 02/08/2015 1724   HCT 36.6 06/27/2021 1149   HCT 42.3 02/08/2015 1724   PLT 252 06/27/2021 1149   PLT 387 02/08/2015 1724   MCV 102.2 (H) 06/27/2021 1149   MCV 98 02/08/2015 1724   MCH 32.4 06/27/2021 1149   MCHC 31.7 06/27/2021 1149   RDW 12.9 06/27/2021 1149   RDW 15.1 (H) 02/08/2015 1724   LYMPHSABS 1.6 06/22/2021 2317   LYMPHSABS 0.8 (L) 02/08/2015 1724   MONOABS 0.5 06/22/2021 2317   MONOABS 0.9 02/08/2015 1724   EOSABS 0.1 06/22/2021 2317   EOSABS 0.1 02/08/2015 1724   BASOSABS 0.0 06/22/2021 2317   BASOSABS 0.1 02/08/2015 1724    Hgb A1C Lab Results  Component Value Date   HGBA1C 5.1 06/28/2020             Assessment & Plan:   Chronic Left Shoulder Pain, Hx of Left Shoulder Fracture:  Xray left shoulder today She will continue Oxycodone as previously prescribed for pain management She will continue PT/OT as previously scheduled She will call Dr. Rudene Christians to schedule a follow up appt  Will follow up after imaging with further recommendations and treatment plan  Webb Silversmith, NP This visit occurred during the SARS-CoV-2 public health emergency.  Safety protocols were in place, including screening questions prior to the visit, additional  usage of staff PPE, and extensive cleaning of exam room while observing appropriate contact time as indicated for disinfecting solutions.

## 2021-11-02 NOTE — Patient Instructions (Signed)
Shoulder Exercises Ask your health care provider which exercises are safe for you. Do exercises exactly as told by your health care provider and adjust them as directed. It is normal to feel mild stretching, pulling, tightness, or discomfort as you do these exercises. Stop right away if you feel sudden pain or your pain gets worse. Do not begin these exercises until told by your health care provider. Stretching exercises External rotation and abduction This exercise is sometimes called corner stretch. This exercise rotates your arm outward (external rotation) and moves your arm out from your body (abduction). Stand in a doorway with one of your feet slightly in front of the other. This is called a staggered stance. If you cannot reach your forearms to the door frame, stand facing a corner of a room. Choose one of the following positions as told by your health care provider: Place your hands and forearms on the door frame above your head. Place your hands and forearms on the door frame at the height of your head. Place your hands on the door frame at the height of your elbows. Slowly move your weight onto your front foot until you feel a stretch across your chest and in the front of your shoulders. Keep your head and chest upright and keep your abdominal muscles tight. Hold for __________ seconds. To release the stretch, shift your weight to your back foot. Repeat __________ times. Complete this exercise __________ times a day. Extension, standing Stand and hold a broomstick, a cane, or a similar object behind your back. Your hands should be a little wider than shoulder width apart. Your palms should face away from your back. Keeping your elbows straight and your shoulder muscles relaxed, move the stick away from your body until you feel a stretch in your shoulders (extension). Avoid shrugging your shoulders while you move the stick. Keep your shoulder blades tucked down toward the middle of your  back. Hold for __________ seconds. Slowly return to the starting position. Repeat __________ times. Complete this exercise __________ times a day. Range-of-motion exercises Pendulum  Stand near a wall or a surface that you can hold onto for balance. Bend at the waist and let your left / right arm hang straight down. Use your other arm to support you. Keep your back straight and do not lock your knees. Relax your left / right arm and shoulder muscles, and move your hips and your trunk so your left / right arm swings freely. Your arm should swing because of the motion of your body, not because you are using your arm or shoulder muscles. Keep moving your hips and trunk so your arm swings in the following directions, as told by your health care provider: Side to side. Forward and backward. In clockwise and counterclockwise circles. Continue each motion for __________ seconds, or for as long as told by your health care provider. Slowly return to the starting position. Repeat __________ times. Complete this exercise __________ times a day. Shoulder flexion, standing  Stand and hold a broomstick, a cane, or a similar object. Place your hands a little more than shoulder width apart on the object. Your left / right hand should be palm up, and your other hand should be palm down. Keep your elbow straight and your shoulder muscles relaxed. Push the stick up with your healthy arm to raise your left / right arm in front of your body, and then over your head until you feel a stretch in your shoulder (flexion). Avoid   shrugging your shoulder while you raise your arm. Keep your shoulder blade tucked down toward the middle of your back. Hold for __________ seconds. Slowly return to the starting position. Repeat __________ times. Complete this exercise __________ times a day. Shoulder abduction, standing Stand and hold a broomstick, a cane, or a similar object. Place your hands a little more than shoulder  width apart on the object. Your left / right hand should be palm up, and your other hand should be palm down. Keep your elbow straight and your shoulder muscles relaxed. Push the object across your body toward your left / right side. Raise your left / right arm to the side of your body (abduction) until you feel a stretch in your shoulder. Do not raise your arm above shoulder height unless your health care provider tells you to do that. If directed, raise your arm over your head. Avoid shrugging your shoulder while you raise your arm. Keep your shoulder blade tucked down toward the middle of your back. Hold for __________ seconds. Slowly return to the starting position. Repeat __________ times. Complete this exercise __________ times a day. Internal rotation  Place your left / right hand behind your back, palm up. Use your other hand to dangle an exercise band, a towel, or a similar object over your shoulder. Grasp the band with your left / right hand so you are holding on to both ends. Gently pull up on the band until you feel a stretch in the front of your left / right shoulder. The movement of your arm toward the center of your body is called internal rotation. Avoid shrugging your shoulder while you raise your arm. Keep your shoulder blade tucked down toward the middle of your back. Hold for __________ seconds. Release the stretch by letting go of the band and lowering your hands. Repeat __________ times. Complete this exercise __________ times a day. Strengthening exercises External rotation  Sit in a stable chair without armrests. Secure an exercise band to a stable object at elbow height on your left / right side. Place a soft object, such as a folded towel or a small pillow, between your left / right upper arm and your body to move your elbow about 4 inches (10 cm) away from your side. Hold the end of the exercise band so it is tight and there is no slack. Keeping your elbow pressed  against the soft object, slowly move your forearm out, away from your abdomen (external rotation). Keep your body steady so only your forearm moves. Hold for __________ seconds. Slowly return to the starting position. Repeat __________ times. Complete this exercise __________ times a day. Shoulder abduction  Sit in a stable chair without armrests, or stand up. Hold a __________ weight in your left / right hand, or hold an exercise band with both hands. Start with your arms straight down and your left / right palm facing in, toward your body. Slowly lift your left / right hand out to your side (abduction). Do not lift your hand above shoulder height unless your health care provider tells you that this is safe. Keep your arms straight. Avoid shrugging your shoulder while you do this movement. Keep your shoulder blade tucked down toward the middle of your back. Hold for __________ seconds. Slowly lower your arm, and return to the starting position. Repeat __________ times. Complete this exercise __________ times a day. Shoulder extension Sit in a stable chair without armrests, or stand up. Secure an exercise band   to a stable object in front of you so it is at shoulder height. Hold one end of the exercise band in each hand. Your palms should face each other. Straighten your elbows and lift your hands up to shoulder height. Step back, away from the secured end of the exercise band, until the band is tight and there is no slack. Squeeze your shoulder blades together as you pull your hands down to the sides of your thighs (extension). Stop when your hands are straight down by your sides. Do not let your hands go behind your body. Hold for __________ seconds. Slowly return to the starting position. Repeat __________ times. Complete this exercise __________ times a day. Shoulder row Sit in a stable chair without armrests, or stand up. Secure an exercise band to a stable object in front of you so it  is at waist height. Hold one end of the exercise band in each hand. Position your palms so that your thumbs are facing the ceiling (neutral position). Bend each of your elbows to a 90-degree angle (right angle) and keep your upper arms at your sides. Step back until the band is tight and there is no slack. Slowly pull your elbows back behind you. Hold for __________ seconds. Slowly return to the starting position. Repeat __________ times. Complete this exercise __________ times a day. Shoulder press-ups  Sit in a stable chair that has armrests. Sit upright, with your feet flat on the floor. Put your hands on the armrests so your elbows are bent and your fingers are pointing forward. Your hands should be about even with the sides of your body. Push down on the armrests and use your arms to lift yourself off the chair. Straighten your elbows and lift yourself up as much as you comfortably can. Move your shoulder blades down, and avoid letting your shoulders move up toward your ears. Keep your feet on the ground. As you get stronger, your feet should support less of your body weight as you lift yourself up. Hold for __________ seconds. Slowly lower yourself back into the chair. Repeat __________ times. Complete this exercise __________ times a day. Wall push-ups  Stand so you are facing a stable wall. Your feet should be about one arm-length away from the wall. Lean forward and place your palms on the wall at shoulder height. Keep your feet flat on the floor as you bend your elbows and lean forward toward the wall. Hold for __________ seconds. Straighten your elbows to push yourself back to the starting position. Repeat __________ times. Complete this exercise __________ times a day. This information is not intended to replace advice given to you by your health care provider. Make sure you discuss any questions you have with your healthcare provider. Document Revised: 02/14/2019 Document  Reviewed: 11/22/2018 Elsevier Patient Education  2022 [employer] Inc.  

## 2021-11-08 DIAGNOSIS — M80052D Age-related osteoporosis with current pathological fracture, left femur, subsequent encounter for fracture with routine healing: Secondary | ICD-10-CM | POA: Diagnosis not present

## 2021-11-08 DIAGNOSIS — M80022D Age-related osteoporosis with current pathological fracture, left humerus, subsequent encounter for fracture with routine healing: Secondary | ICD-10-CM | POA: Diagnosis not present

## 2021-11-08 DIAGNOSIS — I10 Essential (primary) hypertension: Secondary | ICD-10-CM | POA: Diagnosis not present

## 2021-11-08 DIAGNOSIS — W06XXXD Fall from bed, subsequent encounter: Secondary | ICD-10-CM | POA: Diagnosis not present

## 2021-11-08 DIAGNOSIS — H547 Unspecified visual loss: Secondary | ICD-10-CM | POA: Diagnosis not present

## 2021-11-08 DIAGNOSIS — M800AXD Age-related osteoporosis with current pathological fracture, other site, subsequent encounter for fracture with routine healing: Secondary | ICD-10-CM | POA: Diagnosis not present

## 2021-11-08 DIAGNOSIS — H3530 Unspecified macular degeneration: Secondary | ICD-10-CM | POA: Diagnosis not present

## 2021-11-09 DIAGNOSIS — S42292K Other displaced fracture of upper end of left humerus, subsequent encounter for fracture with nonunion: Secondary | ICD-10-CM | POA: Diagnosis not present

## 2021-11-10 DIAGNOSIS — M80022D Age-related osteoporosis with current pathological fracture, left humerus, subsequent encounter for fracture with routine healing: Secondary | ICD-10-CM | POA: Diagnosis not present

## 2021-11-10 DIAGNOSIS — W06XXXD Fall from bed, subsequent encounter: Secondary | ICD-10-CM | POA: Diagnosis not present

## 2021-11-10 DIAGNOSIS — M800AXD Age-related osteoporosis with current pathological fracture, other site, subsequent encounter for fracture with routine healing: Secondary | ICD-10-CM | POA: Diagnosis not present

## 2021-11-10 DIAGNOSIS — H547 Unspecified visual loss: Secondary | ICD-10-CM | POA: Diagnosis not present

## 2021-11-10 DIAGNOSIS — M80052D Age-related osteoporosis with current pathological fracture, left femur, subsequent encounter for fracture with routine healing: Secondary | ICD-10-CM | POA: Diagnosis not present

## 2021-11-10 DIAGNOSIS — H3530 Unspecified macular degeneration: Secondary | ICD-10-CM | POA: Diagnosis not present

## 2021-11-10 DIAGNOSIS — I10 Essential (primary) hypertension: Secondary | ICD-10-CM | POA: Diagnosis not present

## 2021-11-15 DIAGNOSIS — I10 Essential (primary) hypertension: Secondary | ICD-10-CM | POA: Diagnosis not present

## 2021-11-15 DIAGNOSIS — H3530 Unspecified macular degeneration: Secondary | ICD-10-CM | POA: Diagnosis not present

## 2021-11-15 DIAGNOSIS — M80022D Age-related osteoporosis with current pathological fracture, left humerus, subsequent encounter for fracture with routine healing: Secondary | ICD-10-CM | POA: Diagnosis not present

## 2021-11-15 DIAGNOSIS — M800AXD Age-related osteoporosis with current pathological fracture, other site, subsequent encounter for fracture with routine healing: Secondary | ICD-10-CM | POA: Diagnosis not present

## 2021-11-15 DIAGNOSIS — H547 Unspecified visual loss: Secondary | ICD-10-CM | POA: Diagnosis not present

## 2021-11-15 DIAGNOSIS — M80052D Age-related osteoporosis with current pathological fracture, left femur, subsequent encounter for fracture with routine healing: Secondary | ICD-10-CM | POA: Diagnosis not present

## 2021-11-15 DIAGNOSIS — W06XXXD Fall from bed, subsequent encounter: Secondary | ICD-10-CM | POA: Diagnosis not present

## 2021-11-16 DIAGNOSIS — H3530 Unspecified macular degeneration: Secondary | ICD-10-CM | POA: Diagnosis not present

## 2021-11-16 DIAGNOSIS — W06XXXD Fall from bed, subsequent encounter: Secondary | ICD-10-CM | POA: Diagnosis not present

## 2021-11-16 DIAGNOSIS — M80052D Age-related osteoporosis with current pathological fracture, left femur, subsequent encounter for fracture with routine healing: Secondary | ICD-10-CM | POA: Diagnosis not present

## 2021-11-16 DIAGNOSIS — H547 Unspecified visual loss: Secondary | ICD-10-CM | POA: Diagnosis not present

## 2021-11-16 DIAGNOSIS — M800AXD Age-related osteoporosis with current pathological fracture, other site, subsequent encounter for fracture with routine healing: Secondary | ICD-10-CM | POA: Diagnosis not present

## 2021-11-16 DIAGNOSIS — M80022D Age-related osteoporosis with current pathological fracture, left humerus, subsequent encounter for fracture with routine healing: Secondary | ICD-10-CM | POA: Diagnosis not present

## 2021-11-16 DIAGNOSIS — I10 Essential (primary) hypertension: Secondary | ICD-10-CM | POA: Diagnosis not present

## 2021-11-18 DIAGNOSIS — H547 Unspecified visual loss: Secondary | ICD-10-CM | POA: Diagnosis not present

## 2021-11-18 DIAGNOSIS — I10 Essential (primary) hypertension: Secondary | ICD-10-CM | POA: Diagnosis not present

## 2021-11-18 DIAGNOSIS — M800AXD Age-related osteoporosis with current pathological fracture, other site, subsequent encounter for fracture with routine healing: Secondary | ICD-10-CM | POA: Diagnosis not present

## 2021-11-18 DIAGNOSIS — M80022D Age-related osteoporosis with current pathological fracture, left humerus, subsequent encounter for fracture with routine healing: Secondary | ICD-10-CM | POA: Diagnosis not present

## 2021-11-18 DIAGNOSIS — H3530 Unspecified macular degeneration: Secondary | ICD-10-CM | POA: Diagnosis not present

## 2021-11-18 DIAGNOSIS — M80052D Age-related osteoporosis with current pathological fracture, left femur, subsequent encounter for fracture with routine healing: Secondary | ICD-10-CM | POA: Diagnosis not present

## 2021-11-18 DIAGNOSIS — W06XXXD Fall from bed, subsequent encounter: Secondary | ICD-10-CM | POA: Diagnosis not present

## 2021-11-21 DIAGNOSIS — M800AXD Age-related osteoporosis with current pathological fracture, other site, subsequent encounter for fracture with routine healing: Secondary | ICD-10-CM | POA: Diagnosis not present

## 2021-11-21 DIAGNOSIS — W06XXXD Fall from bed, subsequent encounter: Secondary | ICD-10-CM | POA: Diagnosis not present

## 2021-11-21 DIAGNOSIS — M80022D Age-related osteoporosis with current pathological fracture, left humerus, subsequent encounter for fracture with routine healing: Secondary | ICD-10-CM | POA: Diagnosis not present

## 2021-11-21 DIAGNOSIS — H3530 Unspecified macular degeneration: Secondary | ICD-10-CM | POA: Diagnosis not present

## 2021-11-21 DIAGNOSIS — I10 Essential (primary) hypertension: Secondary | ICD-10-CM | POA: Diagnosis not present

## 2021-11-21 DIAGNOSIS — H547 Unspecified visual loss: Secondary | ICD-10-CM | POA: Diagnosis not present

## 2021-11-21 DIAGNOSIS — M80052D Age-related osteoporosis with current pathological fracture, left femur, subsequent encounter for fracture with routine healing: Secondary | ICD-10-CM | POA: Diagnosis not present

## 2021-11-22 DIAGNOSIS — R5381 Other malaise: Secondary | ICD-10-CM | POA: Diagnosis not present

## 2021-11-23 DIAGNOSIS — I10 Essential (primary) hypertension: Secondary | ICD-10-CM | POA: Diagnosis not present

## 2021-11-23 DIAGNOSIS — M80022D Age-related osteoporosis with current pathological fracture, left humerus, subsequent encounter for fracture with routine healing: Secondary | ICD-10-CM | POA: Diagnosis not present

## 2021-11-23 DIAGNOSIS — H547 Unspecified visual loss: Secondary | ICD-10-CM | POA: Diagnosis not present

## 2021-11-23 DIAGNOSIS — H3530 Unspecified macular degeneration: Secondary | ICD-10-CM | POA: Diagnosis not present

## 2021-11-23 DIAGNOSIS — W06XXXD Fall from bed, subsequent encounter: Secondary | ICD-10-CM | POA: Diagnosis not present

## 2021-11-23 DIAGNOSIS — M800AXD Age-related osteoporosis with current pathological fracture, other site, subsequent encounter for fracture with routine healing: Secondary | ICD-10-CM | POA: Diagnosis not present

## 2021-11-23 DIAGNOSIS — M80052D Age-related osteoporosis with current pathological fracture, left femur, subsequent encounter for fracture with routine healing: Secondary | ICD-10-CM | POA: Diagnosis not present

## 2021-11-29 ENCOUNTER — Other Ambulatory Visit: Payer: Self-pay | Admitting: Family Medicine

## 2021-11-29 ENCOUNTER — Other Ambulatory Visit: Payer: Self-pay | Admitting: Orthopedic Surgery

## 2021-11-29 ENCOUNTER — Ambulatory Visit
Admission: RE | Admit: 2021-11-29 | Discharge: 2021-11-29 | Disposition: A | Discharge status: Home / Self Care | Payer: Medicare Other | Source: Ambulatory Visit | Attending: Orthopedic Surgery | Admitting: Orthopedic Surgery

## 2021-11-29 ENCOUNTER — Other Ambulatory Visit: Payer: Self-pay

## 2021-11-29 DIAGNOSIS — Z9889 Other specified postprocedural states: Secondary | ICD-10-CM

## 2021-11-29 DIAGNOSIS — M19012 Primary osteoarthritis, left shoulder: Secondary | ICD-10-CM | POA: Diagnosis not present

## 2021-11-29 DIAGNOSIS — Z981 Arthrodesis status: Secondary | ICD-10-CM | POA: Diagnosis not present

## 2021-11-29 DIAGNOSIS — S42292K Other displaced fracture of upper end of left humerus, subsequent encounter for fracture with nonunion: Secondary | ICD-10-CM | POA: Insufficient documentation

## 2021-11-29 DIAGNOSIS — M25552 Pain in left hip: Secondary | ICD-10-CM

## 2021-11-29 DIAGNOSIS — S42292P Other displaced fracture of upper end of left humerus, subsequent encounter for fracture with malunion: Secondary | ICD-10-CM | POA: Diagnosis not present

## 2021-11-29 DIAGNOSIS — S72002D Fracture of unspecified part of neck of left femur, subsequent encounter for closed fracture with routine healing: Secondary | ICD-10-CM

## 2021-11-29 NOTE — Telephone Encounter (Signed)
Requested medication (s) are due for refill today: yes  Requested medication (s) are on the active medication list: yes  Last refill:  11/02/21  Future visit scheduled: 02/14/2022 (has surgery scheduled 12/12/21)  Notes to clinic:  This medication can not be delegated, please assess.   Requested Prescriptions  Pending Prescriptions Disp Refills   oxyCODONE (OXY IR/ROXICODONE) 5 MG immediate release tablet 90 tablet 0    Sig: Take 1 tablet (5 mg total) by mouth every 3 (three) hours as needed for moderate pain.     Not Delegated - Analgesics:  Opioid Agonists Failed - 11/29/2021  4:50 PM      Failed - This refill cannot be delegated      Failed - Urine Drug Screen completed in last 360 days      Passed - Valid encounter within last 6 months    Recent Outpatient Visits           3 weeks ago Chronic left shoulder pain   [facility] [place], Coralie Keens, NP   1 month ago Pubic lice   [facility] [place], Devonne Doughty, DO   2 months ago Closed fracture of left hip with routine healing, subsequent encounter   [place], DO   3 months ago Closed fracture of left hip with routine healing, subsequent encounter   [place], DO   6 months ago Closed fracture of left hip, initial encounter [facility])   [facility] [place], Devonne Doughty, DO       Future Appointments             In 2 months [facility], [facility]

## 2021-11-29 NOTE — Telephone Encounter (Signed)
Medication Refill - Medication: Oxycodone 12 mg  pt is having shoulder surgery on the 6th.  Has the patient contacted their pharmacy? No. (Agent: If no, request that the patient contact the pharmacy for the refill. If patient does not wish to contact the pharmacy document the reason why and proceed with request.) (Agent: If yes, when and what did the pharmacy advise?)  Preferred Pharmacy (with phone number or street name): [facility] Has the patient been seen for an appointment in the last year OR does the patient have an upcoming appointment? Yes.    Agent: Please be advised that RX refills may take up to 3 business days. We ask that you follow-up with your pharmacy.

## 2021-11-30 DIAGNOSIS — H3530 Unspecified macular degeneration: Secondary | ICD-10-CM | POA: Diagnosis not present

## 2021-11-30 DIAGNOSIS — W06XXXD Fall from bed, subsequent encounter: Secondary | ICD-10-CM | POA: Diagnosis not present

## 2021-11-30 DIAGNOSIS — M80052D Age-related osteoporosis with current pathological fracture, left femur, subsequent encounter for fracture with routine healing: Secondary | ICD-10-CM | POA: Diagnosis not present

## 2021-11-30 DIAGNOSIS — M800AXD Age-related osteoporosis with current pathological fracture, other site, subsequent encounter for fracture with routine healing: Secondary | ICD-10-CM | POA: Diagnosis not present

## 2021-11-30 DIAGNOSIS — I10 Essential (primary) hypertension: Secondary | ICD-10-CM | POA: Diagnosis not present

## 2021-11-30 DIAGNOSIS — M80022D Age-related osteoporosis with current pathological fracture, left humerus, subsequent encounter for fracture with routine healing: Secondary | ICD-10-CM | POA: Diagnosis not present

## 2021-11-30 DIAGNOSIS — H547 Unspecified visual loss: Secondary | ICD-10-CM | POA: Diagnosis not present

## 2021-11-30 MED ORDER — OXYCODONE HCL 5 MG PO TABS: 5.0000 mg | ORAL_TABLET | ORAL | 0 refills | Status: DC | PRN

## 2021-12-02 DIAGNOSIS — M800AXD Age-related osteoporosis with current pathological fracture, other site, subsequent encounter for fracture with routine healing: Secondary | ICD-10-CM | POA: Diagnosis not present

## 2021-12-02 DIAGNOSIS — H547 Unspecified visual loss: Secondary | ICD-10-CM | POA: Diagnosis not present

## 2021-12-02 DIAGNOSIS — M80022D Age-related osteoporosis with current pathological fracture, left humerus, subsequent encounter for fracture with routine healing: Secondary | ICD-10-CM | POA: Diagnosis not present

## 2021-12-02 DIAGNOSIS — I10 Essential (primary) hypertension: Secondary | ICD-10-CM | POA: Diagnosis not present

## 2021-12-02 DIAGNOSIS — H3530 Unspecified macular degeneration: Secondary | ICD-10-CM | POA: Diagnosis not present

## 2021-12-02 DIAGNOSIS — M80052D Age-related osteoporosis with current pathological fracture, left femur, subsequent encounter for fracture with routine healing: Secondary | ICD-10-CM | POA: Diagnosis not present

## 2021-12-02 DIAGNOSIS — W06XXXD Fall from bed, subsequent encounter: Secondary | ICD-10-CM | POA: Diagnosis not present

## 2021-12-05 ENCOUNTER — Encounter
Admission: RE | Admit: 2021-12-05 | Discharge: 2021-12-05 | Disposition: A | Discharge status: Home / Self Care | Payer: Medicare Other | Source: Ambulatory Visit | Attending: Orthopedic Surgery | Admitting: Orthopedic Surgery

## 2021-12-05 ENCOUNTER — Other Ambulatory Visit: Payer: Self-pay

## 2021-12-05 VITALS — BP 113/52 | HR 94 | Resp 14 | Ht <= 58 in | Wt 94.0 lb

## 2021-12-05 DIAGNOSIS — Z01818 Encounter for other preprocedural examination: Secondary | ICD-10-CM | POA: Diagnosis not present

## 2021-12-05 DIAGNOSIS — H547 Unspecified visual loss: Secondary | ICD-10-CM | POA: Diagnosis not present

## 2021-12-05 DIAGNOSIS — M80022D Age-related osteoporosis with current pathological fracture, left humerus, subsequent encounter for fracture with routine healing: Secondary | ICD-10-CM | POA: Diagnosis not present

## 2021-12-05 DIAGNOSIS — Z01810 Encounter for preprocedural cardiovascular examination: Secondary | ICD-10-CM | POA: Diagnosis not present

## 2021-12-05 DIAGNOSIS — M80052D Age-related osteoporosis with current pathological fracture, left femur, subsequent encounter for fracture with routine healing: Secondary | ICD-10-CM | POA: Diagnosis not present

## 2021-12-05 DIAGNOSIS — W06XXXD Fall from bed, subsequent encounter: Secondary | ICD-10-CM | POA: Diagnosis not present

## 2021-12-05 DIAGNOSIS — M800AXD Age-related osteoporosis with current pathological fracture, other site, subsequent encounter for fracture with routine healing: Secondary | ICD-10-CM | POA: Diagnosis not present

## 2021-12-05 DIAGNOSIS — I10 Essential (primary) hypertension: Secondary | ICD-10-CM | POA: Diagnosis not present

## 2021-12-05 DIAGNOSIS — H3530 Unspecified macular degeneration: Secondary | ICD-10-CM | POA: Diagnosis not present

## 2021-12-05 LAB — URINALYSIS, ROUTINE W REFLEX MICROSCOPIC
Bilirubin Urine: NEGATIVE
Glucose, UA: NEGATIVE mg/dL
Hgb urine dipstick: NEGATIVE
Leukocytes,Ua: NEGATIVE
Nitrite: NEGATIVE
Protein, ur: NEGATIVE mg/dL
Specific Gravity, Urine: <1.005 — ABNORMAL LOW (ref 1.005–1.030)
pH: 5.5 (ref 5.0–8.0)

## 2021-12-05 LAB — CBC WITH DIFFERENTIAL/PLATELET
Abs Immature Granulocytes: 0.01 10*3/uL (ref 0.00–0.07)
Basophils Absolute: 0 10*3/uL (ref 0.0–0.1)
Basophils Relative: 1 %
Eosinophils Absolute: 0.1 10*3/uL (ref 0.0–0.5)
Eosinophils Relative: 2 %
HCT: 46.4 % — ABNORMAL HIGH (ref 36.0–46.0)
Hemoglobin: 14.9 g/dL (ref 12.0–15.0)
Immature Granulocytes: 0 %
Lymphocytes Relative: 32 %
Lymphs Abs: 1.7 10*3/uL (ref 0.7–4.0)
MCH: 30.5 pg (ref 26.0–34.0)
MCHC: 32.1 g/dL (ref 30.0–36.0)
MCV: 94.9 fL (ref 80.0–100.0)
Monocytes Absolute: 0.5 10*3/uL (ref 0.1–1.0)
Monocytes Relative: 9 %
Neutro Abs: 3.1 10*3/uL (ref 1.7–7.7)
Neutrophils Relative %: 56 %
Platelets: 306 10*3/uL (ref 150–400)
RBC: 4.89 MIL/uL (ref 3.87–5.11)
RDW: 14 % (ref 11.5–15.5)
WBC: 5.4 10*3/uL (ref 4.0–10.5)
nRBC: 0 % (ref 0.0–0.2)

## 2021-12-05 LAB — COMPREHENSIVE METABOLIC PANEL
ALT: 17 U/L (ref 0–44)
AST: 27 U/L (ref 15–41)
Albumin: 3.9 g/dL (ref 3.5–5.0)
Alkaline Phosphatase: 110 U/L (ref 38–126)
Anion gap: 9 (ref 5–15)
BUN: 14 mg/dL (ref 8–23)
CO2: 27 mmol/L (ref 22–32)
Calcium: 8.4 mg/dL — ABNORMAL LOW (ref 8.9–10.3)
Chloride: 100 mmol/L (ref 98–111)
Creatinine, Ser: 0.46 mg/dL (ref 0.44–1.00)
GFR, Estimated: >60 mL/min (ref >60)
Glucose, Bld: 83 mg/dL (ref 70–99)
Potassium: 3.6 mmol/L (ref 3.5–5.1)
Sodium: 136 mmol/L (ref 135–145)
Total Bilirubin: 0.4 mg/dL (ref 0.3–1.2)
Total Protein: 6.6 g/dL (ref 6.5–8.1)

## 2021-12-05 LAB — TYPE AND SCREEN
ABO/RH(D): O NEG
Antibody Screen: NEGATIVE

## 2021-12-05 LAB — SURGICAL PCR SCREEN
MRSA, PCR: NEGATIVE
Staphylococcus aureus: NEGATIVE

## 2021-12-05 NOTE — Patient Instructions (Signed)
Your procedure is scheduled on: 12/12/21 Report to [place]. To find out your arrival time please call [phone] between 1PM - 3PM on 12/09/21.  Remember: Instructions that are not followed completely may result in serious medical risk, up to and including death, or upon the discretion of your surgeon and anesthesiologist your surgery may need to be rescheduled.     _X__ 1. Do not eat food after midnight the night before your procedure.                 No gum chewing or hard candies. You may drink clear liquids up to 2 hours                 before you are scheduled to arrive for your surgery- DO not drink clear                 liquids within 2 hours of the start of your surgery.                 Clear Liquids include:  water, apple juice without pulp, clear carbohydrate                 drink such as Clearfast or Gatorade, Black Coffee or Tea (Do not add                 anything to coffee or tea). Diabetics water only  Finish the Ensure "clear" pre surgery drink 2 hours before arriving for surgery   __X__2.  On the morning of surgery brush your teeth with toothpaste and water, you                 may rinse your mouth with mouthwash if you wish.  Do not swallow any              toothpaste of mouthwash.     _X__ 3.  No Alcohol for 24 hours before or after surgery.   _X__ 4.  Do Not Smoke or use e-cigarettes For 24 Hours Prior to Your Surgery.                 Do not use any chewable tobacco products for at least 6 hours prior to                 surgery.  ____  5.  Bring all medications with you on the day of surgery if instructed.   __X__  6.  Notify your doctor if there is any change in your medical condition      (cold, fever, infections).     Do not wear jewelry, make-up, hairpins, clips or nail polish. Do not wear lotions, powders, or perfumes. NO DEODORANT Do not shave body hair 48 hours prior to surgery. Men may shave face and  neck. Do not bring valuables to the hospital.    [facility] is not responsible for any belongings or valuables.  Contacts, dentures/partials or body piercings may not be worn into surgery. Bring a case for your contacts, glasses or hearing aids, a denture cup will be supplied. Leave your suitcase in the car. After surgery it may be brought to your room. For patients admitted to the hospital, discharge time is determined by your treatment team.   Patients discharged the day of surgery will not be allowed to drive home.   Please read over the following fact sheets that you were given:  MRSA Information, CHG/sage, Ensure, Benzoyl Peroxide  __X__ Take these medicines the morning of surgery with A SIP OF WATER:    1. amLODipine (NORVASC) 10 MG tablet  2. buPROPion (WELLBUTRIN XL) 150 MG 24 hr tablet  3. busPIRone (BUSPAR) 10 MG tablet  4. OLANZapine (ZYPREXA) 7.5 MG tablet  5. omeprazole (PRILOSEC) 20 MG capsule  6. oxyCODONE (OXY IR/ROXICODONE) 5 MG immediate release tablet  7. simvastatin (ZOCOR) 20 MG tablet  8. venlafaxine XR (EFFEXOR-XR) 75 MG 24 hr capsule  ____ Fleet Enema (as directed)   __X__ Use CHG Soap/SAGE wipes as directed  ____ Use inhalers on the day of surgery  ____ Stop metformin/Janumet/Farxiga 2 days prior to surgery    ____ Take 1/2 of usual insulin dose the night before surgery. No insulin the morning          of surgery.   ____ Stop Blood Thinners Coumadin/Plavix/Xarelto/Pleta/Pradaxa/Eliquis/Effient/Aspirin  on   Or contact your Surgeon, Cardiologist or Medical Doctor regarding  ability to stop your blood thinners  __X__ Stop Anti-inflammatories 7 days before surgery such as Advil, Ibuprofen, Motrin,  BC or Goodies Powder, Naprosyn, Naproxen, Aleve, Aspirin    __X__ Stop all herbals and supplements, fish oil or vitamins  until after surgery.  Vitamin C, B12  ____ Bring C-Pap to the hospital.    You can take Tylenol.

## 2021-12-06 ENCOUNTER — Ambulatory Visit: Payer: Self-pay | Admitting: *Deleted

## 2021-12-06 ENCOUNTER — Telehealth: Payer: Self-pay

## 2021-12-06 NOTE — Telephone Encounter (Signed)
Copied from [place] [id number]. Topic: General - Other >> Dec 06, 2021 10:59 AM Loma [place] wrote: Gillermina Hu, calling from [facility] states working to improve pt balance for upcoming surgery, husband reports to him today that pt fell heading into kitchen during nite, he examined and reports no bruises, did not hit head and states "no issues" but is trying to work on that balance before shoulder surgery. No need fu but questions Micah # [phone]

## 2021-12-06 NOTE — Telephone Encounter (Signed)
Reason for Disposition  [1] Recent fall AND [2] no injury  [1] Caller has NON-URGENT question AND [2] triager unable to answer question  Answer Assessment - Initial Assessment Questions 1. MECHANISM: "How did the fall happen?"     "Didn't have walker" 2. DOMESTIC VIOLENCE AND ELDER ABUSE SCREENING: "Did you fall because someone pushed you or tried to hurt you?" If Yes, ask: "Are you safe now?"     no 3. ONSET: "When did the fall happen?" (e.g., minutes, hours, or days ago)     Lost balance 4. LOCATION: "What part of the body hit the ground?" (e.g., back, buttocks, head, hips, knees, hands, head, stomach)     "No sure, knot on head." 5. INJURY: "Did you hurt (injure) yourself when you fell?" If Yes, ask: "What did you injure? Tell me more about this?" (e.g., body area; type of injury; pain severity)"     no 6. PAIN: "Is there any pain?" If Yes, ask: "How bad is the pain?" (e.g., Scale 1-10; or mild,  moderate, severe)   - NONE (0): No pain   - MILD (1-3): Doesn't interfere with normal activities    - MODERATE (4-7): Interferes with normal activities or awakens from sleep    - SEVERE (8-10): Excruciating pain, unable to do any normal activities      no 7. SIZE: For cuts, bruises, or swelling, ask: "How large is it?" (e.g., inches or centimeters)      no  9. OTHER SYMPTOMS: "Do you have any other symptoms?" (e.g., dizziness, fever, weakness; new onset or worsening).      None 10. CAUSE: "What do you think caused the fall (or falling)?" (e.g., tripped, dizzy spell)       Didn't have walker  Protocols used: Falls and [facility]

## 2021-12-06 NOTE — Telephone Encounter (Signed)
°  Chief Complaint: [place] Saturday 12/03/21 Symptoms: "Bump on head" Frequency: Saturday Pertinent Negatives: Patient denies headache, dizziness. Pt is not on blood thinners. Disposition: [] ED /[] Urgent Care (no appt availability in office) / [] Appointment(In office/virtual)/ []  [facility]/ [] Home Care/ [] Refused Recommended Disposition /[] [place] [facility]/ [x]  Follow-up with PCP Additional Notes:  Micha PTA with [facility]  called to report fall. States Pt's pain at baseline S/P surgery, no bruises or lacerations, VSS.  Called pt, states did hit head with fall, small bump.

## 2021-12-06 NOTE — Telephone Encounter (Signed)
Acknowledged. No further questions.  Pamela Ball, [facility] 12/06/2021, 5:38 PM

## 2021-12-07 DIAGNOSIS — H3530 Unspecified macular degeneration: Secondary | ICD-10-CM | POA: Diagnosis not present

## 2021-12-07 DIAGNOSIS — W06XXXD Fall from bed, subsequent encounter: Secondary | ICD-10-CM | POA: Diagnosis not present

## 2021-12-07 DIAGNOSIS — M800AXD Age-related osteoporosis with current pathological fracture, other site, subsequent encounter for fracture with routine healing: Secondary | ICD-10-CM | POA: Diagnosis not present

## 2021-12-07 DIAGNOSIS — H547 Unspecified visual loss: Secondary | ICD-10-CM | POA: Diagnosis not present

## 2021-12-07 DIAGNOSIS — M80052D Age-related osteoporosis with current pathological fracture, left femur, subsequent encounter for fracture with routine healing: Secondary | ICD-10-CM | POA: Diagnosis not present

## 2021-12-07 DIAGNOSIS — I10 Essential (primary) hypertension: Secondary | ICD-10-CM | POA: Diagnosis not present

## 2021-12-07 DIAGNOSIS — M80022D Age-related osteoporosis with current pathological fracture, left humerus, subsequent encounter for fracture with routine healing: Secondary | ICD-10-CM | POA: Diagnosis not present

## 2021-12-09 ENCOUNTER — Other Ambulatory Visit: Payer: Self-pay

## 2021-12-09 ENCOUNTER — Other Ambulatory Visit
Admission: RE | Admit: 2021-12-09 | Discharge: 2021-12-09 | Disposition: A | Discharge status: Home / Self Care | Payer: Medicare Other | Source: Ambulatory Visit | Attending: Orthopedic Surgery | Admitting: Orthopedic Surgery

## 2021-12-09 DIAGNOSIS — Z20822 Contact with and (suspected) exposure to covid-19: Secondary | ICD-10-CM | POA: Diagnosis not present

## 2021-12-09 DIAGNOSIS — Z01812 Encounter for preprocedural laboratory examination: Secondary | ICD-10-CM | POA: Insufficient documentation

## 2021-12-09 NOTE — Anesthesia Preprocedure Evaluation (Deleted)
Anesthesia Evaluation  Patient identified by MRN, date of birth, ID band Patient awake    Reviewed: Allergy & Precautions, NPO status , Patient's Chart, lab work & pertinent test results  Airway Mallampati: III  TM Distance: >3 FB Neck ROM: full    Dental   Pulmonary asthma , COPD,    Pulmonary exam normal        Cardiovascular hypertension, Pt. on medications Normal cardiovascular exam+ dysrhythmias   Normal sinus rhythm Incomplete right bundle branch block Borderline ECG When compared with ECG of 29-May-2020 19:12, PREVIOUS ECG IS PRESENT Confirmed by Neoma Laming [phone]) on 12/06/2021 7:48:13 AM   Neuro/Psych PSYCHIATRIC DISORDERS Anxiety Depression Bipolar Disorder negative neurological ROS     GI/Hepatic Neg liver ROS, hiatal hernia, GERD  ,  Endo/Other  negative endocrine ROS  Renal/GU      Musculoskeletal  (+) Arthritis ,   Abdominal   Peds  Hematology negative hematology ROS (+)   Anesthesia Other Findings Past Medical History: No date: Abdominal hernia with obstruction and without gangrene 02/15/2017: Anxiety     Comment:  denies No date: Asthma 02/15/2017: Bipolar 1 disorder, depressed, full remission (HCC) No date: Bipolar affective disorder ([place]) No date: COPD (chronic obstructive pulmonary disease) ([place]) No date: Depression 02/15/2017: GERD (gastroesophageal reflux disease) No date: Glaucoma No date: Headache No date: Hiatal hernia 05/21/2017: History of abdominal hernia 02/15/2017: History of compression fracture of spine No date: Hyperlipidemia No date: Hypertension No date: Incisional hernia 06/02/2017: Incisional ventral hernia w obstruction 05/23/2017: Normocytic anemia 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, 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  Past Surgical History: 11/06/2006: ABDOMINAL HYSTERECTOMY No date: APPENDECTOMY 12/14/2020: BREAST BIOPSY; Right     Comment:  stereo bx  of distortion/asymmetry, coil marker, path               pending No date: BREAST EXCISIONAL BIOPSY; Left     Comment:  age 12- benign  No date: CESAREAN SECTION     Comment:  x3 06/25/2018: COLONOSCOPY WITH PROPOFOL; N/A     Comment:  Procedure: COLONOSCOPY WITH PROPOFOL;  Surgeon: Jonathon Bellows, MD;  Location: [facility] ENDOSCOPY;  Service:               Endoscopy;  Laterality: N/A; No date: KYPHOPLASTY 09/23/2018: KYPHOPLASTY; N/A     Comment:  Procedure: KYPHOPLASTY T10;  Surgeon: Hessie Knows, MD;              Location: [facility] ORS;  Service: Orthopedics;  Laterality:               N/A; 03/11/2020: KYPHOPLASTY; N/A     Comment:  Procedure: L3 KYPHOPLASTY;  Surgeon: Hessie Knows, MD;               Location: [facility] ORS;  Service: Orthopedics;  Laterality:               N/A; 12/30/2020: KYPHOPLASTY; N/A     Comment:  Procedure: L5 KYPHOPLASTY;  Surgeon: Hessie Knows, MD;               Location: [facility] ORS;  Service: Orthopedics;  Laterality:               N/A; 06/23/2021: ORIF FEMUR FRACTURE; Left     Comment:  Procedure: OPEN REDUCTION INTERNAL FIXATION (ORIF)               DISTAL FEMUR FRACTURE;  Surgeon: Hessie Knows, MD;                Location: [facility] ORS;  Service: Orthopedics;  Laterality:               Left; 08/07/2019: ORIF HUMERUS FRACTURE; Left     Comment:  Procedure: OPEN REDUCTION INTERNAL FIXATION (ORIF) LEFT               DISTAL HUMERUS FRACTURE;  Surgeon: Altamese Lawton, MD;                Location: [place];  Service: Orthopedics;  Laterality:               Left; 06/02/2017: VENTRAL HERNIA REPAIR; N/A     Comment:  Procedure: exploratory laparotomy repair of incarcerated              VENTRAL hernia;  Surgeon: Florene Glen, MD;                Location: [facility] ORS;  Service: General;  Laterality: N/A; 08/29/2018: VENTRAL HERNIA REPAIR; N/A     Comment:  Procedure: LAPAROSCOPIC INCISIONAL HERNIA;  Surgeon:               Olean Ree, MD;  Location: [facility] ORS;   Service:               General;  Laterality: N/A;     Reproductive/Obstetrics negative OB ROS                             Anesthesia Physical Anesthesia Plan  ASA: 2  Anesthesia Plan: General ETT   Post-op Pain Management:    Induction:   PONV Risk Score and Plan: Ondansetron, Dexamethasone, Midazolam and Treatment may vary due to age or medical condition  Airway Management Planned:   Additional Equipment:   Intra-op Plan:   Post-operative Plan:   Informed Consent:     Dental Advisory Given  Plan Discussed with: Anesthesiologist, CRNA and Surgeon  Anesthesia Plan Comments:         Anesthesia Quick Evaluation

## 2021-12-10 LAB — SARS CORONAVIRUS 2 (TAT 6-24 HRS): SARS Coronavirus 2: NEGATIVE

## 2021-12-11 MED ORDER — ORAL CARE MOUTH RINSE: 15.0000 mL | Freq: Once | OROMUCOSAL | Status: AC

## 2021-12-11 MED ORDER — CEFAZOLIN SODIUM-DEXTROSE 2-4 GM/100ML-% IV SOLN: 2.0000 g | INTRAVENOUS | Status: AC

## 2021-12-11 MED ORDER — LACTATED RINGERS IV SOLN: INTRAVENOUS | Status: DC

## 2021-12-11 MED ORDER — CHLORHEXIDINE GLUCONATE 0.12 % MT SOLN: 15.0000 mL | Freq: Once | OROMUCOSAL | Status: AC

## 2021-12-12 ENCOUNTER — Ambulatory Visit: Payer: Self-pay | Admitting: *Deleted

## 2021-12-12 MED ORDER — DEXAMETHASONE SODIUM PHOSPHATE 10 MG/ML IJ SOLN
INTRAMUSCULAR | Status: AC
  Filled 2021-12-12: qty 1

## 2021-12-12 MED ORDER — GLYCOPYRROLATE 0.2 MG/ML IJ SOLN
INTRAMUSCULAR | Status: AC
  Filled 2021-12-12: qty 1

## 2021-12-12 MED ORDER — ONDANSETRON HCL 4 MG/2ML IJ SOLN
INTRAMUSCULAR | Status: AC
  Filled 2021-12-12: qty 2

## 2021-12-12 MED ORDER — PROPOFOL 10 MG/ML IV BOLUS
INTRAVENOUS | Status: AC
  Filled 2021-12-12: qty 40

## 2021-12-12 MED ORDER — PHENYLEPHRINE HCL-NACL 20-0.9 MG/250ML-% IV SOLN
INTRAVENOUS | Status: AC
  Filled 2021-12-12: qty 250

## 2021-12-12 MED ORDER — FENTANYL CITRATE (PF) 100 MCG/2ML IJ SOLN
INTRAMUSCULAR | Status: AC
  Filled 2021-12-12: qty 2

## 2021-12-12 MED ORDER — ROCURONIUM BROMIDE 10 MG/ML (PF) SYRINGE
PREFILLED_SYRINGE | INTRAVENOUS | Status: AC
  Filled 2021-12-12: qty 10

## 2021-12-12 MED ORDER — LIDOCAINE HCL (PF) 2 % IJ SOLN
INTRAMUSCULAR | Status: AC
  Filled 2021-12-12: qty 5

## 2021-12-12 NOTE — Progress Notes (Signed)
Patient called to cancel surgery for today.  Patient reported reaction to Chlorhexidine with extreme leg swelling. Patient and family requesting to cancel and reschedule for later date.

## 2021-12-12 NOTE — Telephone Encounter (Signed)
FYI.... Pt has apt 12/13/2021   Thanks,   -Mickel Baas

## 2021-12-12 NOTE — Telephone Encounter (Signed)
°  Chief Complaint: bilateral legs swelling after using "wipes" prior to surgery . Surgery canceled this am told to call PCP Symptoms: bilateral feet to knees swollen since last night  Frequency: noted this am Pertinent Negatives: Patient denies chest pain , difficulty breathing , no fever, no redness to legs  Disposition: [] ED /[] Urgent Care (no appt availability in office) / [x] Appointment(In office/virtual)/ []  [facility]/ [] Home Care/ [] Refused Recommended Disposition /[] [facility]/ []  Follow-up with PCP Additional Notes:  Appt scheduled for 12/13/21. Please advise if patient can be seen today . Surgeon requested patient to call PCP     Reason for Disposition  [1] MODERATE leg swelling (e.g., swelling extends up to knees) AND [2] new-onset or worsening  Answer Assessment - Initial Assessment Questions 1. ONSET: "When did the swelling start?" (e.g., minutes, hours, days)     This am  2. LOCATION: "What part of the leg is swollen?"  "Are both legs swollen or just one leg?"     Feet to knees both 3. SEVERITY: "How bad is the swelling?" (e.g., localized; mild, moderate, severe)  - Localized - small area of swelling localized to one leg  - MILD pedal edema - swelling limited to foot and ankle, pitting edema < 1/4 inch (6 mm) deep, rest and elevation eliminate most or all swelling  - MODERATE edema - swelling of lower leg to knee, pitting edema > 1/4 inch (6 mm) deep, rest and elevation only partially reduce swelling  - SEVERE edema - swelling extends above knee, facial or hand swelling present      No pitting edema  4. REDNESS: "Does the swelling look red or infected?"     None  5. PAIN: "Is the swelling painful to touch?" If Yes, ask: "How painful is it?"   (Scale 1-10; mild, moderate or severe)     none 6. FEVER: "Do you have a fever?" If Yes, ask: "What is it, how was it measured, and when did it start?"      No  7. CAUSE: "What do you think is causing the leg  swelling?"     "Wipes prior to surgery" 8. MEDICAL HISTORY: "Do you have a history of heart failure, kidney disease, liver failure, or cancer?"     no 9. RECURRENT SYMPTOM: "Have you had leg swelling before?" If Yes, ask: "When was the last time?" "What happened that time?"     Some right leg and feet and ankles 10. OTHER SYMPTOMS: "Do you have any other symptoms?" (e.g., chest pain, difficulty breathing)       No  11. PREGNANCY: "Is there any chance you are pregnant?" "When was your last menstrual period?"       na  Protocols used: Leg Swelling and Edema-A-AH

## 2021-12-13 ENCOUNTER — Ambulatory Visit (INDEPENDENT_AMBULATORY_CARE_PROVIDER_SITE_OTHER): Payer: Medicare Other | Admitting: Family Medicine

## 2021-12-13 ENCOUNTER — Encounter: Payer: Self-pay | Admitting: Family Medicine

## 2021-12-13 ENCOUNTER — Other Ambulatory Visit: Payer: Self-pay

## 2021-12-13 VITALS — BP 124/60 | HR 92 | Ht <= 58 in | Wt 94.0 lb

## 2021-12-13 DIAGNOSIS — I872 Venous insufficiency (chronic) (peripheral): Secondary | ICD-10-CM | POA: Insufficient documentation

## 2021-12-13 MED ORDER — FUROSEMIDE 20 MG PO TABS: 20.0000 mg | ORAL_TABLET | Freq: Every day | ORAL | 2 refills | Status: DC | PRN

## 2021-12-13 NOTE — Patient Instructions (Addendum)
Thank you for coming to the office today.  Lower extremity swelling due to veins, this is common scenario called Venous Insuffiency  Use RICE therapy: - R - Rest / relative rest with activity modification avoid overuse of joint - I - Ice packs (make sure you use a towel or sock / something to protect skin) - C - Compression with stockings to apply pressure and reduce swelling allowing more support - E - Elevation - if significant swelling, lift leg above heart level (toes above your nose) to help reduce swelling, most helpful at night after day of being on your feet  Take Furosemide Lasix fluid pill as needed for swelling, 20mg  daily as needed, can take a few times a week if you need, or skip several days. Caution too low fluid and low BP can make you dizzy  Stay well hydrated to produce more urine  Left leg will swell more because of Hip.  Activity and walking is good to help fluid.  If significant change with swelling on one side with redness and pain, may need to do further imaging of the vascular, to rule out blood clot, but no evidence of this today on exam, it appears to be just due to vein swelling.  Please schedule a Follow-up Appointment to: Return if symptoms worsen or fail to improve.  If you have any other questions or concerns, please feel free to call the office or send a message through [facility]. You may also schedule an earlier appointment if necessary.  Additionally, you may be receiving a survey about your experience at our office within a few days to 1 week by e-mail or mail. We value your feedback.  [name], DO [facility]

## 2021-12-13 NOTE — Progress Notes (Signed)
Subjective:    Patient ID: Pamela Ball, female    DOB: [birth year], 76 y.o.   MRN: [id number]  Pamela Ball is a 76 y.o. female presenting on 12/13/2021 for Leg Swelling   HPI  Lower Extremity Swelling She was scheduled for L Shoulder replacement, this past week, and she cancelled due to swelling of legs bilateral. Limited ambulation lately, she is only walking to and from bathroom on occasion Prior history of Left hip fracture, contributing to L sided symptoms Improved in morning after sleep and elevation, then worse throughout the day Mostly sitting in wheelchair, limited mobility Not using compression Not on diuretic Denies leg pain or redness or ulceration  Depression screen [facility] 2/9 02/08/2021 06/28/2020 06/01/2020  Decreased Interest 0 0 0  Down, Depressed, Hopeless 0 0 0  PHQ - 2 Score 0 0 0  Altered sleeping - 2 1  Tired, decreased energy - 3 2  Change in appetite - 0 0  Feeling bad or failure about yourself  - 0 0  Trouble concentrating - 0 2  Moving slowly or fidgety/restless - 0 1  Suicidal thoughts - 0 0  PHQ-9 Score - 5 6  Difficult doing work/chores - Not difficult at all Not difficult at all  Some recent data might be hidden    Social History   Tobacco Use   Smoking status: Never   Smokeless tobacco: Never  Vaping Use   Vaping Use: Never used  Substance Use Topics   Alcohol use: No    Comment: drink occasionally    Drug use: No    Review of Systems Per HPI unless specifically indicated above     Objective:    BP 124/60    Pulse 92    Ht 4\' 10"  (1.473 m)    Wt 94 lb (42.6 kg)    SpO2 95%    BMI 19.65 kg/m   Wt Readings from Last 3 Encounters:  12/13/21 94 lb (42.6 kg)  12/05/21 94 lb (42.6 kg)  10/03/21 123 lb (55.8 kg)    Physical Exam Vitals and nursing note reviewed.  Constitutional:      General: She is not in acute distress.    Appearance: Normal appearance. She is well-developed. She is not diaphoretic.     Comments:  Well-appearing, comfortable, cooperative  HENT:     Head: Normocephalic and atraumatic.  Eyes:     General:        Right eye: No discharge.        Left eye: No discharge.     Conjunctiva/sclera: Conjunctivae normal.  Cardiovascular:     Rate and Rhythm: Normal rate.  Pulmonary:     Effort: Pulmonary effort is normal.  Musculoskeletal:     Right lower leg: Edema present.     Left lower leg: Edema (mildly increased compared to Right. some +1 pitting edema foot to mid lower leg, around knee or below. no erythema non tender) present.  Skin:    General: Skin is warm and dry.     Findings: No erythema or rash.  Neurological:     Mental Status: She is alert and oriented to person, place, and time.  Psychiatric:        Mood and Affect: Mood normal.        Behavior: Behavior normal.        Thought Content: Thought content normal.     Comments: Well groomed, good eye contact, normal speech and thoughts  Results for orders placed or performed during the hospital encounter of 12/09/21  SARS CORONAVIRUS 2 (TAT 6-24 HRS) Nasopharyngeal Nasopharyngeal Swab   Specimen: Nasopharyngeal Swab  Result Value Ref Range   SARS Coronavirus 2 NEGATIVE NEGATIVE      Assessment & Plan:   Problem List Items Addressed This Visit     Venous insufficiency of both lower extremities - Primary   Relevant Medications   furosemide (LASIX) 20 MG tablet     Lower extremity swelling due to veins, this is common scenario called Venous Insuffiency  Use RICE therapy: - R - Rest / relative rest with activity modification avoid overuse of joint - I - Ice packs (make sure you use a towel or sock / something to protect skin) - C - Compression with stockings to apply pressure and reduce swelling allowing more support - E - Elevation - if significant swelling, lift leg above heart level (toes above your nose) to help reduce swelling, most helpful at night after day of being on your feet  Take Furosemide Lasix  fluid pill as needed for swelling, 20mg  daily as needed, can take a few times a week if you need, or skip several days. Caution too low fluid and low BP can make you dizzy  Stay well hydrated to produce more urine  Left leg will swell more because of Hip.  Activity and walking is good to help fluid.  Letter written to Dr Shanon Payor, will fax to them, okay to proceed w/ Shoulder Surgery on 2/17  Meds ordered this encounter  Medications   furosemide (LASIX) 20 MG tablet    Sig: Take 1 tablet (20 mg total) by mouth daily as needed for fluid or edema.    Dispense:  30 tablet    Refill:  2      Follow up plan: Return if symptoms worsen or fail to improve.   [name], [facility] 12/13/2021, 8:52 AM

## 2021-12-14 DIAGNOSIS — H3530 Unspecified macular degeneration: Secondary | ICD-10-CM | POA: Diagnosis not present

## 2021-12-14 DIAGNOSIS — I10 Essential (primary) hypertension: Secondary | ICD-10-CM | POA: Diagnosis not present

## 2021-12-14 DIAGNOSIS — W06XXXD Fall from bed, subsequent encounter: Secondary | ICD-10-CM | POA: Diagnosis not present

## 2021-12-14 DIAGNOSIS — H547 Unspecified visual loss: Secondary | ICD-10-CM | POA: Diagnosis not present

## 2021-12-14 DIAGNOSIS — M80022D Age-related osteoporosis with current pathological fracture, left humerus, subsequent encounter for fracture with routine healing: Secondary | ICD-10-CM | POA: Diagnosis not present

## 2021-12-14 DIAGNOSIS — M80052D Age-related osteoporosis with current pathological fracture, left femur, subsequent encounter for fracture with routine healing: Secondary | ICD-10-CM | POA: Diagnosis not present

## 2021-12-14 DIAGNOSIS — M800AXD Age-related osteoporosis with current pathological fracture, other site, subsequent encounter for fracture with routine healing: Secondary | ICD-10-CM | POA: Diagnosis not present

## 2021-12-15 DIAGNOSIS — H547 Unspecified visual loss: Secondary | ICD-10-CM | POA: Diagnosis not present

## 2021-12-15 DIAGNOSIS — I10 Essential (primary) hypertension: Secondary | ICD-10-CM | POA: Diagnosis not present

## 2021-12-15 DIAGNOSIS — M80022D Age-related osteoporosis with current pathological fracture, left humerus, subsequent encounter for fracture with routine healing: Secondary | ICD-10-CM | POA: Diagnosis not present

## 2021-12-15 DIAGNOSIS — W06XXXD Fall from bed, subsequent encounter: Secondary | ICD-10-CM | POA: Diagnosis not present

## 2021-12-15 DIAGNOSIS — H3530 Unspecified macular degeneration: Secondary | ICD-10-CM | POA: Diagnosis not present

## 2021-12-15 DIAGNOSIS — M800AXD Age-related osteoporosis with current pathological fracture, other site, subsequent encounter for fracture with routine healing: Secondary | ICD-10-CM | POA: Diagnosis not present

## 2021-12-15 DIAGNOSIS — M80052D Age-related osteoporosis with current pathological fracture, left femur, subsequent encounter for fracture with routine healing: Secondary | ICD-10-CM | POA: Diagnosis not present

## 2021-12-20 DIAGNOSIS — M80052D Age-related osteoporosis with current pathological fracture, left femur, subsequent encounter for fracture with routine healing: Secondary | ICD-10-CM | POA: Diagnosis not present

## 2021-12-20 DIAGNOSIS — W06XXXD Fall from bed, subsequent encounter: Secondary | ICD-10-CM | POA: Diagnosis not present

## 2021-12-20 DIAGNOSIS — H547 Unspecified visual loss: Secondary | ICD-10-CM | POA: Diagnosis not present

## 2021-12-20 DIAGNOSIS — H3530 Unspecified macular degeneration: Secondary | ICD-10-CM | POA: Diagnosis not present

## 2021-12-20 DIAGNOSIS — I10 Essential (primary) hypertension: Secondary | ICD-10-CM | POA: Diagnosis not present

## 2021-12-20 DIAGNOSIS — M800AXD Age-related osteoporosis with current pathological fracture, other site, subsequent encounter for fracture with routine healing: Secondary | ICD-10-CM | POA: Diagnosis not present

## 2021-12-20 DIAGNOSIS — M80022D Age-related osteoporosis with current pathological fracture, left humerus, subsequent encounter for fracture with routine healing: Secondary | ICD-10-CM | POA: Diagnosis not present

## 2021-12-21 ENCOUNTER — Other Ambulatory Visit: Payer: Self-pay

## 2021-12-21 ENCOUNTER — Other Ambulatory Visit
Admission: RE | Admit: 2021-12-21 | Discharge: 2021-12-21 | Disposition: A | Discharge status: Home / Self Care | Payer: Medicare Other | Source: Ambulatory Visit | Attending: Orthopedic Surgery | Admitting: Orthopedic Surgery

## 2021-12-21 DIAGNOSIS — S42392D Other fracture of shaft of left humerus, subsequent encounter for fracture with routine healing: Secondary | ICD-10-CM | POA: Diagnosis not present

## 2021-12-21 DIAGNOSIS — Z01812 Encounter for preprocedural laboratory examination: Secondary | ICD-10-CM | POA: Insufficient documentation

## 2021-12-21 DIAGNOSIS — E785 Hyperlipidemia, unspecified: Secondary | ICD-10-CM | POA: Diagnosis not present

## 2021-12-21 DIAGNOSIS — I10 Essential (primary) hypertension: Secondary | ICD-10-CM | POA: Diagnosis not present

## 2021-12-21 DIAGNOSIS — S4992XA Unspecified injury of left shoulder and upper arm, initial encounter: Secondary | ICD-10-CM | POA: Diagnosis not present

## 2021-12-21 DIAGNOSIS — R5381 Other malaise: Secondary | ICD-10-CM | POA: Diagnosis not present

## 2021-12-21 DIAGNOSIS — S42292A Other displaced fracture of upper end of left humerus, initial encounter for closed fracture: Secondary | ICD-10-CM | POA: Diagnosis not present

## 2021-12-21 DIAGNOSIS — K219 Gastro-esophageal reflux disease without esophagitis: Secondary | ICD-10-CM | POA: Diagnosis not present

## 2021-12-21 DIAGNOSIS — W06XXXD Fall from bed, subsequent encounter: Secondary | ICD-10-CM | POA: Diagnosis not present

## 2021-12-21 DIAGNOSIS — G8918 Other acute postprocedural pain: Secondary | ICD-10-CM | POA: Diagnosis not present

## 2021-12-21 DIAGNOSIS — Z79899 Other long term (current) drug therapy: Secondary | ICD-10-CM | POA: Diagnosis not present

## 2021-12-21 DIAGNOSIS — S42352A Displaced comminuted fracture of shaft of humerus, left arm, initial encounter for closed fracture: Secondary | ICD-10-CM | POA: Diagnosis not present

## 2021-12-21 DIAGNOSIS — J449 Chronic obstructive pulmonary disease, unspecified: Secondary | ICD-10-CM | POA: Diagnosis not present

## 2021-12-21 DIAGNOSIS — M7521 Bicipital tendinitis, right shoulder: Secondary | ICD-10-CM | POA: Diagnosis not present

## 2021-12-21 DIAGNOSIS — S42302P Unspecified fracture of shaft of humerus, left arm, subsequent encounter for fracture with malunion: Secondary | ICD-10-CM | POA: Diagnosis not present

## 2021-12-21 DIAGNOSIS — Z20822 Contact with and (suspected) exposure to covid-19: Secondary | ICD-10-CM | POA: Diagnosis not present

## 2021-12-21 DIAGNOSIS — S42242P 4-part fracture of surgical neck of left humerus, subsequent encounter for fracture with malunion: Secondary | ICD-10-CM | POA: Diagnosis not present

## 2021-12-21 DIAGNOSIS — M79602 Pain in left arm: Secondary | ICD-10-CM | POA: Diagnosis not present

## 2021-12-21 DIAGNOSIS — S42292P Other displaced fracture of upper end of left humerus, subsequent encounter for fracture with malunion: Secondary | ICD-10-CM | POA: Diagnosis not present

## 2021-12-21 DIAGNOSIS — M80022D Age-related osteoporosis with current pathological fracture, left humerus, subsequent encounter for fracture with routine healing: Secondary | ICD-10-CM | POA: Diagnosis not present

## 2021-12-21 DIAGNOSIS — M170 Bilateral primary osteoarthritis of knee: Secondary | ICD-10-CM | POA: Diagnosis not present

## 2021-12-21 DIAGNOSIS — Z96642 Presence of left artificial hip joint: Secondary | ICD-10-CM | POA: Diagnosis not present

## 2021-12-21 DIAGNOSIS — Z9889 Other specified postprocedural states: Secondary | ICD-10-CM | POA: Diagnosis not present

## 2021-12-21 DIAGNOSIS — H547 Unspecified visual loss: Secondary | ICD-10-CM | POA: Diagnosis not present

## 2021-12-21 DIAGNOSIS — S42302A Unspecified fracture of shaft of humerus, left arm, initial encounter for closed fracture: Secondary | ICD-10-CM | POA: Diagnosis not present

## 2021-12-21 DIAGNOSIS — M800AXD Age-related osteoporosis with current pathological fracture, other site, subsequent encounter for fracture with routine healing: Secondary | ICD-10-CM | POA: Diagnosis not present

## 2021-12-21 DIAGNOSIS — S42292K Other displaced fracture of upper end of left humerus, subsequent encounter for fracture with nonunion: Secondary | ICD-10-CM | POA: Diagnosis not present

## 2021-12-21 DIAGNOSIS — H3530 Unspecified macular degeneration: Secondary | ICD-10-CM | POA: Diagnosis not present

## 2021-12-21 DIAGNOSIS — M80052D Age-related osteoporosis with current pathological fracture, left femur, subsequent encounter for fracture with routine healing: Secondary | ICD-10-CM | POA: Diagnosis not present

## 2021-12-21 DIAGNOSIS — Z471 Aftercare following joint replacement surgery: Secondary | ICD-10-CM | POA: Diagnosis not present

## 2021-12-21 LAB — SARS CORONAVIRUS 2 (TAT 6-24 HRS): SARS Coronavirus 2: NEGATIVE

## 2021-12-22 DIAGNOSIS — H547 Unspecified visual loss: Secondary | ICD-10-CM | POA: Diagnosis not present

## 2021-12-22 DIAGNOSIS — M80022D Age-related osteoporosis with current pathological fracture, left humerus, subsequent encounter for fracture with routine healing: Secondary | ICD-10-CM | POA: Diagnosis not present

## 2021-12-22 DIAGNOSIS — H3530 Unspecified macular degeneration: Secondary | ICD-10-CM | POA: Diagnosis not present

## 2021-12-22 DIAGNOSIS — W06XXXD Fall from bed, subsequent encounter: Secondary | ICD-10-CM | POA: Diagnosis not present

## 2021-12-22 DIAGNOSIS — M800AXD Age-related osteoporosis with current pathological fracture, other site, subsequent encounter for fracture with routine healing: Secondary | ICD-10-CM | POA: Diagnosis not present

## 2021-12-22 DIAGNOSIS — M80052D Age-related osteoporosis with current pathological fracture, left femur, subsequent encounter for fracture with routine healing: Secondary | ICD-10-CM | POA: Diagnosis not present

## 2021-12-22 DIAGNOSIS — I10 Essential (primary) hypertension: Secondary | ICD-10-CM | POA: Diagnosis not present

## 2021-12-23 ENCOUNTER — Inpatient Hospital Stay: Payer: Medicare Other

## 2021-12-23 ENCOUNTER — Inpatient Hospital Stay: Payer: Medicare Other | Admitting: Anesthesiology

## 2021-12-23 ENCOUNTER — Encounter
Admission: RE | Disposition: A | Discharge status: Skilled Nursing Facility | Payer: Self-pay | Source: Home / Self Care | Attending: Orthopedic Surgery

## 2021-12-23 ENCOUNTER — Inpatient Hospital Stay
Admission: RE | Admit: 2021-12-23 | Discharge: 2021-12-27 | DRG: 483 | Disposition: A | Discharge status: Skilled Nursing Facility | Payer: Medicare Other | Attending: Orthopedic Surgery | Admitting: Orthopedic Surgery

## 2021-12-23 ENCOUNTER — Observation Stay: Payer: Medicare Other

## 2021-12-23 ENCOUNTER — Other Ambulatory Visit: Payer: Self-pay

## 2021-12-23 ENCOUNTER — Encounter: Payer: Self-pay | Admitting: Orthopedic Surgery

## 2021-12-23 DIAGNOSIS — I10 Essential (primary) hypertension: Secondary | ICD-10-CM | POA: Diagnosis present

## 2021-12-23 DIAGNOSIS — E785 Hyperlipidemia, unspecified: Secondary | ICD-10-CM | POA: Diagnosis not present

## 2021-12-23 DIAGNOSIS — S42242P 4-part fracture of surgical neck of left humerus, subsequent encounter for fracture with malunion: Principal | ICD-10-CM

## 2021-12-23 DIAGNOSIS — S42392D Other fracture of shaft of left humerus, subsequent encounter for fracture with routine healing: Secondary | ICD-10-CM | POA: Diagnosis not present

## 2021-12-23 DIAGNOSIS — W19XXXD Unspecified fall, subsequent encounter: Secondary | ICD-10-CM | POA: Diagnosis present

## 2021-12-23 DIAGNOSIS — Z79899 Other long term (current) drug therapy: Secondary | ICD-10-CM

## 2021-12-23 DIAGNOSIS — Z96642 Presence of left artificial hip joint: Secondary | ICD-10-CM | POA: Diagnosis not present

## 2021-12-23 DIAGNOSIS — S42352A Displaced comminuted fracture of shaft of humerus, left arm, initial encounter for closed fracture: Secondary | ICD-10-CM | POA: Diagnosis not present

## 2021-12-23 DIAGNOSIS — S42292K Other displaced fracture of upper end of left humerus, subsequent encounter for fracture with nonunion: Secondary | ICD-10-CM | POA: Diagnosis not present

## 2021-12-23 DIAGNOSIS — Z9889 Other specified postprocedural states: Secondary | ICD-10-CM | POA: Diagnosis not present

## 2021-12-23 DIAGNOSIS — M170 Bilateral primary osteoarthritis of knee: Secondary | ICD-10-CM | POA: Diagnosis present

## 2021-12-23 DIAGNOSIS — S42293P Other displaced fracture of upper end of unspecified humerus, subsequent encounter for fracture with malunion: Secondary | ICD-10-CM | POA: Diagnosis present

## 2021-12-23 DIAGNOSIS — M7521 Bicipital tendinitis, right shoulder: Secondary | ICD-10-CM | POA: Diagnosis not present

## 2021-12-23 DIAGNOSIS — Z96619 Presence of unspecified artificial shoulder joint: Secondary | ICD-10-CM

## 2021-12-23 DIAGNOSIS — G8918 Other acute postprocedural pain: Secondary | ICD-10-CM | POA: Diagnosis not present

## 2021-12-23 DIAGNOSIS — Z90710 Acquired absence of both cervix and uterus: Secondary | ICD-10-CM

## 2021-12-23 DIAGNOSIS — S4992XA Unspecified injury of left shoulder and upper arm, initial encounter: Secondary | ICD-10-CM | POA: Diagnosis not present

## 2021-12-23 DIAGNOSIS — Z20822 Contact with and (suspected) exposure to covid-19: Secondary | ICD-10-CM | POA: Diagnosis present

## 2021-12-23 DIAGNOSIS — S42302A Unspecified fracture of shaft of humerus, left arm, initial encounter for closed fracture: Secondary | ICD-10-CM | POA: Diagnosis not present

## 2021-12-23 DIAGNOSIS — K219 Gastro-esophageal reflux disease without esophagitis: Secondary | ICD-10-CM | POA: Diagnosis present

## 2021-12-23 DIAGNOSIS — S42292A Other displaced fracture of upper end of left humerus, initial encounter for closed fracture: Secondary | ICD-10-CM | POA: Diagnosis not present

## 2021-12-23 DIAGNOSIS — S42292P Other displaced fracture of upper end of left humerus, subsequent encounter for fracture with malunion: Secondary | ICD-10-CM | POA: Diagnosis not present

## 2021-12-23 DIAGNOSIS — J449 Chronic obstructive pulmonary disease, unspecified: Secondary | ICD-10-CM | POA: Diagnosis present

## 2021-12-23 DIAGNOSIS — S42302P Unspecified fracture of shaft of humerus, left arm, subsequent encounter for fracture with malunion: Secondary | ICD-10-CM

## 2021-12-23 DIAGNOSIS — Z471 Aftercare following joint replacement surgery: Secondary | ICD-10-CM | POA: Diagnosis not present

## 2021-12-23 DIAGNOSIS — M79602 Pain in left arm: Secondary | ICD-10-CM | POA: Diagnosis not present

## 2021-12-23 DIAGNOSIS — Z419 Encounter for procedure for purposes other than remedying health state, unspecified: Secondary | ICD-10-CM

## 2021-12-23 HISTORY — PX: ORIF HUMERUS FRACTURE: SHX2126

## 2021-12-23 HISTORY — PX: REVERSE SHOULDER ARTHROPLASTY: SHX5054

## 2021-12-23 LAB — CBC
HCT: 34.7 % — ABNORMAL LOW (ref 36.0–46.0)
Hemoglobin: 11.2 g/dL — ABNORMAL LOW (ref 12.0–15.0)
MCH: 30.8 pg (ref 26.0–34.0)
MCHC: 32.3 g/dL (ref 30.0–36.0)
MCV: 95.3 fL (ref 80.0–100.0)
Platelets: 262 10*3/uL (ref 150–400)
RBC: 3.64 MIL/uL — ABNORMAL LOW (ref 3.87–5.11)
RDW: 14.2 % (ref 11.5–15.5)
WBC: 11.2 10*3/uL — ABNORMAL HIGH (ref 4.0–10.5)
nRBC: 0 % (ref 0.0–0.2)

## 2021-12-23 LAB — TYPE AND SCREEN
ABO/RH(D): O NEG
Antibody Screen: NEGATIVE

## 2021-12-23 SURGERY — ARTHROPLASTY, SHOULDER, TOTAL, REVERSE
Anesthesia: General | Site: Shoulder | Laterality: Left

## 2021-12-23 MED ORDER — AMLODIPINE BESYLATE 10 MG PO TABS
10.0000 mg | ORAL_TABLET | Freq: Every day | ORAL | Status: DC
  Administered 2021-12-24 – 2021-12-27 (×3): 10 mg via ORAL
  Filled 2021-12-23 (×4): qty 1

## 2021-12-23 MED ORDER — VANCOMYCIN HCL 1000 MG IV SOLR
INTRAVENOUS | Status: DC | PRN
  Administered 2021-12-23: 1000 mg via TOPICAL

## 2021-12-23 MED ORDER — LIDOCAINE HCL (PF) 1 % IJ SOLN
INTRAMUSCULAR | Status: AC
  Filled 2021-12-23: qty 5

## 2021-12-23 MED ORDER — PROPOFOL 10 MG/ML IV BOLUS
INTRAVENOUS | Status: DC | PRN
  Administered 2021-12-23 (×2): 20 mg via INTRAVENOUS
  Administered 2021-12-23: 100 mg via INTRAVENOUS
  Administered 2021-12-23: 30 mg via INTRAVENOUS

## 2021-12-23 MED ORDER — OXYCODONE HCL 5 MG PO TABS
5.0000 mg | ORAL_TABLET | ORAL | Status: DC | PRN
  Administered 2021-12-24: 5 mg via ORAL
  Administered 2021-12-24 – 2021-12-27 (×3): 10 mg via ORAL
  Filled 2021-12-23 (×7): qty 2

## 2021-12-23 MED ORDER — VITAMIN D 25 MCG (1000 UNIT) PO TABS
500.0000 [IU] | ORAL_TABLET | Freq: Every day | ORAL | Status: DC
  Administered 2021-12-24 – 2021-12-27 (×4): 500 [IU] via ORAL
  Filled 2021-12-23 (×4): qty 1

## 2021-12-23 MED ORDER — ACETAMINOPHEN 500 MG PO TABS
1000.0000 mg | ORAL_TABLET | Freq: Three times a day (TID) | ORAL | Status: AC
  Administered 2021-12-23 – 2021-12-24 (×3): 1000 mg via ORAL
  Filled 2021-12-23 (×5): qty 2

## 2021-12-23 MED ORDER — MENTHOL 3 MG MT LOZG
1.0000 | LOZENGE | OROMUCOSAL | Status: DC | PRN
  Filled 2021-12-23: qty 9

## 2021-12-23 MED ORDER — METOCLOPRAMIDE HCL 5 MG/ML IJ SOLN: 5.0000 mg | Freq: Three times a day (TID) | INTRAMUSCULAR | Status: DC | PRN

## 2021-12-23 MED ORDER — PANTOPRAZOLE SODIUM 40 MG PO TBEC
40.0000 mg | DELAYED_RELEASE_TABLET | Freq: Every day | ORAL | Status: DC
  Administered 2021-12-24 – 2021-12-27 (×4): 40 mg via ORAL
  Filled 2021-12-23 (×4): qty 1

## 2021-12-23 MED ORDER — MIDAZOLAM HCL 2 MG/2ML IJ SOLN
INTRAMUSCULAR | Status: AC
  Filled 2021-12-23: qty 2

## 2021-12-23 MED ORDER — VASOPRESSIN 20 UNIT/ML IV SOLN
INTRAVENOUS | Status: DC | PRN
  Administered 2021-12-23 (×4): 1 [IU] via INTRAVENOUS

## 2021-12-23 MED ORDER — BUPIVACAINE LIPOSOME 1.3 % IJ SUSP
INTRAMUSCULAR | Status: DC | PRN
  Administered 2021-12-23: 10 mL via PERINEURAL

## 2021-12-23 MED ORDER — ONDANSETRON HCL 4 MG/2ML IJ SOLN: 4.0000 mg | Freq: Four times a day (QID) | INTRAMUSCULAR | Status: DC | PRN

## 2021-12-23 MED ORDER — DEXAMETHASONE SODIUM PHOSPHATE 10 MG/ML IJ SOLN
INTRAMUSCULAR | Status: DC | PRN
  Administered 2021-12-23: 4 mg via INTRAVENOUS

## 2021-12-23 MED ORDER — ALBUTEROL SULFATE (2.5 MG/3ML) 0.083% IN NEBU
3.0000 mL | INHALATION_SOLUTION | RESPIRATORY_TRACT | Status: DC | PRN
  Filled 2021-12-23: qty 3

## 2021-12-23 MED ORDER — OLANZAPINE 5 MG PO TABS
7.5000 mg | ORAL_TABLET | Freq: Every day | ORAL | Status: DC
  Administered 2021-12-24 – 2021-12-27 (×4): 7.5 mg via ORAL
  Filled 2021-12-23 (×4): qty 2

## 2021-12-23 MED ORDER — SUGAMMADEX SODIUM 200 MG/2ML IV SOLN
INTRAVENOUS | Status: DC | PRN
Start: 2021-12-23 — End: 2021-12-23
  Administered 2021-12-23: 100 mg via INTRAVENOUS

## 2021-12-23 MED ORDER — PHENYLEPHRINE HCL (PRESSORS) 10 MG/ML IV SOLN
INTRAVENOUS | Status: DC | PRN
  Administered 2021-12-23: 100 ug via INTRAVENOUS
  Administered 2021-12-23: 150 ug via INTRAVENOUS
  Administered 2021-12-23 (×9): 100 ug via INTRAVENOUS

## 2021-12-23 MED ORDER — LACTATED RINGERS IV BOLUS
250.0000 mL | Freq: Once | INTRAVENOUS | Status: AC
  Administered 2021-12-23: 250 mL via INTRAVENOUS

## 2021-12-23 MED ORDER — FENTANYL CITRATE PF 50 MCG/ML IJ SOSY
PREFILLED_SYRINGE | INTRAMUSCULAR | Status: AC
  Administered 2021-12-23: 50 ug via INTRAVENOUS
  Filled 2021-12-23: qty 1

## 2021-12-23 MED ORDER — TRANEXAMIC ACID-NACL 1000-0.7 MG/100ML-% IV SOLN
INTRAVENOUS | Status: AC
  Administered 2021-12-23: 1000 mg via INTRAVENOUS
  Filled 2021-12-23: qty 100

## 2021-12-23 MED ORDER — CALCIUM CHLORIDE 10 % IV SOLN
INTRAVENOUS | Status: AC
  Filled 2021-12-23: qty 10

## 2021-12-23 MED ORDER — LIDOCAINE HCL (CARDIAC) PF 100 MG/5ML IV SOSY
PREFILLED_SYRINGE | INTRAVENOUS | Status: DC | PRN
  Administered 2021-12-23: 40 mg via INTRAVENOUS

## 2021-12-23 MED ORDER — BISACODYL 10 MG RE SUPP
10.0000 mg | Freq: Every day | RECTAL | Status: DC | PRN
  Administered 2021-12-27: 10 mg via RECTAL
  Filled 2021-12-23: qty 1

## 2021-12-23 MED ORDER — BUPIVACAINE LIPOSOME 1.3 % IJ SUSP
INTRAMUSCULAR | Status: AC
  Filled 2021-12-23: qty 20

## 2021-12-23 MED ORDER — NEOMYCIN-POLYMYXIN B GU 40-200000 IR SOLN
Status: DC | PRN
  Administered 2021-12-23: 1004 mL

## 2021-12-23 MED ORDER — EPHEDRINE 5 MG/ML INJ
INTRAVENOUS | Status: AC
  Filled 2021-12-23: qty 5

## 2021-12-23 MED ORDER — ROCURONIUM BROMIDE 100 MG/10ML IV SOLN
INTRAVENOUS | Status: DC | PRN
Start: 2021-12-23 — End: 2021-12-23
  Administered 2021-12-23 (×2): 10 mg via INTRAVENOUS
  Administered 2021-12-23: 5 mg via INTRAVENOUS
  Administered 2021-12-23: 30 mg via INTRAVENOUS

## 2021-12-23 MED ORDER — METOCLOPRAMIDE HCL 10 MG PO TABS: 5.0000 mg | ORAL_TABLET | Freq: Three times a day (TID) | ORAL | Status: DC | PRN

## 2021-12-23 MED ORDER — TRANEXAMIC ACID-NACL 1000-0.7 MG/100ML-% IV SOLN
INTRAVENOUS | Status: AC
  Filled 2021-12-23: qty 100

## 2021-12-23 MED ORDER — BUPROPION HCL ER (XL) 150 MG PO TB24
150.0000 mg | ORAL_TABLET | Freq: Every day | ORAL | Status: DC
  Administered 2021-12-24 – 2021-12-27 (×4): 150 mg via ORAL
  Filled 2021-12-23 (×4): qty 1

## 2021-12-23 MED ORDER — DOCUSATE SODIUM 100 MG PO CAPS
100.0000 mg | ORAL_CAPSULE | Freq: Two times a day (BID) | ORAL | Status: DC
  Administered 2021-12-23 – 2021-12-27 (×8): 100 mg via ORAL
  Filled 2021-12-23 (×8): qty 1

## 2021-12-23 MED ORDER — ACETAMINOPHEN 10 MG/ML IV SOLN
INTRAVENOUS | Status: DC | PRN
  Administered 2021-12-23: 650 mg via INTRAVENOUS

## 2021-12-23 MED ORDER — CEFAZOLIN SODIUM-DEXTROSE 2-4 GM/100ML-% IV SOLN
2.0000 g | Freq: Four times a day (QID) | INTRAVENOUS | Status: AC
  Administered 2021-12-23 – 2021-12-24 (×3): 2 g via INTRAVENOUS

## 2021-12-23 MED ORDER — BUPIVACAINE HCL (PF) 0.5 % IJ SOLN
INTRAMUSCULAR | Status: DC | PRN
  Administered 2021-12-23: 10 mL via PERINEURAL

## 2021-12-23 MED ORDER — PHENOL 1.4 % MT LIQD
1.0000 | OROMUCOSAL | Status: DC | PRN
  Filled 2021-12-23: qty 177

## 2021-12-23 MED ORDER — FENTANYL CITRATE PF 50 MCG/ML IJ SOSY: 50.0000 ug | PREFILLED_SYRINGE | Freq: Once | INTRAMUSCULAR | Status: AC

## 2021-12-23 MED ORDER — JUVEN PO PACK
PACK | Freq: Every day | ORAL | Status: DC
  Administered 2021-12-24 – 2021-12-25 (×2): 1 via ORAL

## 2021-12-23 MED ORDER — PRESERVISION AREDS PO CAPS
ORAL_CAPSULE | Freq: Every day | ORAL | Status: DC
Start: 2021-12-23 — End: 2021-12-23

## 2021-12-23 MED ORDER — STERILE WATER FOR IRRIGATION IR SOLN
Status: DC | PRN
  Administered 2021-12-23: 1000 mL

## 2021-12-23 MED ORDER — TRANEXAMIC ACID-NACL 1000-0.7 MG/100ML-% IV SOLN: 1000.0000 mg | Freq: Once | INTRAVENOUS | Status: AC

## 2021-12-23 MED ORDER — ROCURONIUM BROMIDE 10 MG/ML (PF) SYRINGE
PREFILLED_SYRINGE | INTRAVENOUS | Status: AC
  Filled 2021-12-23: qty 10

## 2021-12-23 MED ORDER — PHENYLEPHRINE HCL (PRESSORS) 10 MG/ML IV SOLN
INTRAVENOUS | Status: AC
  Filled 2021-12-23: qty 1

## 2021-12-23 MED ORDER — BUPIVACAINE LIPOSOME 1.3 % IJ SUSP
INTRAMUSCULAR | Status: AC
  Filled 2021-12-23: qty 10

## 2021-12-23 MED ORDER — EPHEDRINE SULFATE (PRESSORS) 50 MG/ML IJ SOLN
INTRAMUSCULAR | Status: DC | PRN
  Administered 2021-12-23 (×2): 5 mg via INTRAVENOUS
  Administered 2021-12-23: 10 mg via INTRAVENOUS
  Administered 2021-12-23: 5 mg via INTRAVENOUS
  Administered 2021-12-23: 10 mg via INTRAVENOUS

## 2021-12-23 MED ORDER — BUPIVACAINE-EPINEPHRINE (PF) 0.25% -1:200000 IJ SOLN
INTRAMUSCULAR | Status: AC
  Filled 2021-12-23: qty 30

## 2021-12-23 MED ORDER — LIDOCAINE HCL (PF) 1 % IJ SOLN
INTRAMUSCULAR | Status: DC | PRN
  Administered 2021-12-23: 1 mL via SUBCUTANEOUS

## 2021-12-23 MED ORDER — CHLORHEXIDINE GLUCONATE 0.12 % MT SOLN
OROMUCOSAL | Status: AC
  Administered 2021-12-23: 15 mL via OROMUCOSAL
  Filled 2021-12-23: qty 15

## 2021-12-23 MED ORDER — OXYCODONE HCL 5 MG/5ML PO SOLN: 5.0000 mg | Freq: Once | ORAL | Status: DC | PRN

## 2021-12-23 MED ORDER — HYDROMORPHONE HCL 1 MG/ML IJ SOLN: 0.2000 mg | INTRAMUSCULAR | Status: DC | PRN

## 2021-12-23 MED ORDER — NEOMYCIN-POLYMYXIN B GU 40-200000 IR SOLN
Status: AC
  Filled 2021-12-23: qty 20

## 2021-12-23 MED ORDER — BUPIVACAINE HCL (PF) 0.5 % IJ SOLN
INTRAMUSCULAR | Status: AC
  Filled 2021-12-23: qty 10

## 2021-12-23 MED ORDER — ASPIRIN EC 325 MG PO TBEC
325.0000 mg | DELAYED_RELEASE_TABLET | Freq: Every day | ORAL | Status: DC
  Administered 2021-12-24 – 2021-12-27 (×4): 325 mg via ORAL
  Filled 2021-12-23 (×4): qty 1

## 2021-12-23 MED ORDER — ACETAMINOPHEN 10 MG/ML IV SOLN: 1000.0000 mg | Freq: Once | INTRAVENOUS | Status: DC | PRN

## 2021-12-23 MED ORDER — VITAMIN B-12 1000 MCG PO TABS: 3000.0000 ug | ORAL_TABLET | Freq: Every day | ORAL | Status: DC

## 2021-12-23 MED ORDER — VENLAFAXINE HCL ER 75 MG PO CP24
75.0000 mg | ORAL_CAPSULE | Freq: Every day | ORAL | Status: DC
  Administered 2021-12-24 – 2021-12-27 (×4): 75 mg via ORAL
  Filled 2021-12-23 (×5): qty 1

## 2021-12-23 MED ORDER — ADULT MULTIVITAMIN W/MINERALS CH
1.0000 | ORAL_TABLET | Freq: Every day | ORAL | Status: DC
  Administered 2021-12-24 – 2021-12-27 (×4): 1 via ORAL
  Filled 2021-12-23 (×4): qty 1

## 2021-12-23 MED ORDER — OXYCODONE HCL 5 MG PO TABS
10.0000 mg | ORAL_TABLET | ORAL | Status: DC | PRN
  Administered 2021-12-23: 10 mg via ORAL
  Administered 2021-12-24 – 2021-12-25 (×3): 15 mg via ORAL
  Administered 2021-12-25 – 2021-12-27 (×7): 10 mg via ORAL
  Filled 2021-12-23 (×4): qty 2
  Filled 2021-12-23 (×2): qty 3
  Filled 2021-12-23: qty 2
  Filled 2021-12-23: qty 3

## 2021-12-23 MED ORDER — ALUM & MAG HYDROXIDE-SIMETH 200-200-20 MG/5ML PO SUSP: 30.0000 mL | ORAL | Status: DC | PRN

## 2021-12-23 MED ORDER — FENTANYL CITRATE (PF) 100 MCG/2ML IJ SOLN
INTRAMUSCULAR | Status: AC
  Filled 2021-12-23: qty 2

## 2021-12-23 MED ORDER — ONDANSETRON HCL 4 MG/2ML IJ SOLN
INTRAMUSCULAR | Status: DC | PRN
Start: 2021-12-23 — End: 2021-12-23
  Administered 2021-12-23: 4 mg via INTRAVENOUS

## 2021-12-23 MED ORDER — FENTANYL CITRATE (PF) 100 MCG/2ML IJ SOLN
INTRAMUSCULAR | Status: DC | PRN
  Administered 2021-12-23 (×2): 12.5 ug via INTRAVENOUS
  Administered 2021-12-23: 50 ug via INTRAVENOUS
  Administered 2021-12-23 (×3): 25 ug via INTRAVENOUS

## 2021-12-23 MED ORDER — SIMVASTATIN 20 MG PO TABS
20.0000 mg | ORAL_TABLET | Freq: Every day | ORAL | Status: DC
  Administered 2021-12-24 – 2021-12-27 (×4): 20 mg via ORAL
  Filled 2021-12-23 (×4): qty 1

## 2021-12-23 MED ORDER — TRANEXAMIC ACID-NACL 1000-0.7 MG/100ML-% IV SOLN
1000.0000 mg | INTRAVENOUS | Status: AC
  Administered 2021-12-23: 1000 mg via INTRAVENOUS

## 2021-12-23 MED ORDER — PHENYLEPHRINE HCL-NACL 20-0.9 MG/250ML-% IV SOLN
INTRAVENOUS | Status: DC | PRN
  Administered 2021-12-23: 50 ug/min via INTRAVENOUS

## 2021-12-23 MED ORDER — CEFAZOLIN SODIUM-DEXTROSE 2-4 GM/100ML-% IV SOLN
INTRAVENOUS | Status: AC
  Filled 2021-12-23: qty 100

## 2021-12-23 MED ORDER — SUCCINYLCHOLINE CHLORIDE 200 MG/10ML IV SOSY
PREFILLED_SYRINGE | INTRAVENOUS | Status: DC | PRN
Start: 2021-12-23 — End: 2021-12-23
  Administered 2021-12-23: 60 mg via INTRAVENOUS

## 2021-12-23 MED ORDER — SODIUM CHLORIDE 0.9 % IR SOLN
Status: DC | PRN
  Administered 2021-12-23: 3012 mL

## 2021-12-23 MED ORDER — SENNOSIDES-DOCUSATE SODIUM 8.6-50 MG PO TABS
1.0000 | ORAL_TABLET | Freq: Every evening | ORAL | Status: DC | PRN
  Administered 2021-12-27: 1 via ORAL
  Filled 2021-12-23 (×2): qty 1

## 2021-12-23 MED ORDER — CALCIUM CHLORIDE 10 % IV SOLN
INTRAVENOUS | Status: DC | PRN
  Administered 2021-12-23: 100 mg via INTRAVENOUS
  Administered 2021-12-23: 200 mg via INTRAVENOUS
  Administered 2021-12-23 (×2): 100 mg via INTRAVENOUS

## 2021-12-23 MED ORDER — CEFAZOLIN SODIUM-DEXTROSE 2-4 GM/100ML-% IV SOLN
2.0000 g | Freq: Once | INTRAVENOUS | Status: AC
Start: 2021-12-23 — End: 2021-12-23
  Administered 2021-12-23 (×2): 2 g via INTRAVENOUS

## 2021-12-23 MED ORDER — BUSPIRONE HCL 10 MG PO TABS
10.0000 mg | ORAL_TABLET | Freq: Every day | ORAL | Status: DC
  Administered 2021-12-24 – 2021-12-27 (×4): 10 mg via ORAL
  Filled 2021-12-23 (×4): qty 1

## 2021-12-23 MED ORDER — OXYCODONE HCL 5 MG PO TABS: 5.0000 mg | ORAL_TABLET | Freq: Once | ORAL | Status: DC | PRN

## 2021-12-23 MED ORDER — ACETAMINOPHEN 10 MG/ML IV SOLN
INTRAVENOUS | Status: AC
  Filled 2021-12-23: qty 100

## 2021-12-23 MED ORDER — FUROSEMIDE 20 MG PO TABS: 20.0000 mg | ORAL_TABLET | Freq: Every day | ORAL | Status: DC | PRN

## 2021-12-23 MED ORDER — LIDOCAINE HCL (PF) 4 % IJ SOLN
INTRAMUSCULAR | Status: DC | PRN
  Administered 2021-12-23: 100 mg

## 2021-12-23 MED ORDER — PROMETHAZINE HCL 25 MG/ML IJ SOLN: 6.2500 mg | INTRAMUSCULAR | Status: DC | PRN

## 2021-12-23 MED ORDER — VANCOMYCIN HCL 1000 MG IV SOLR
INTRAVENOUS | Status: AC
  Filled 2021-12-23: qty 20

## 2021-12-23 MED ORDER — ONDANSETRON HCL 4 MG PO TABS: 4.0000 mg | ORAL_TABLET | Freq: Four times a day (QID) | ORAL | Status: DC | PRN

## 2021-12-23 MED ORDER — SODIUM CHLORIDE 0.9 % IV SOLN: INTRAVENOUS | Status: DC

## 2021-12-23 MED ORDER — GLYCOPYRROLATE 0.2 MG/ML IJ SOLN
INTRAMUSCULAR | Status: DC | PRN
  Administered 2021-12-23 (×2): .1 mg via INTRAVENOUS

## 2021-12-23 SURGICAL SUPPLY — 101 items
BASEPLATE P2 COATD GLND 6.5X30 (Shoulder) IMPLANT
BIT DRILL 2.5 DIA 127 CALI (BIT) ×1 IMPLANT
BIT DRILL 2.5X110 QC LCP DISP (BIT) ×1 IMPLANT
BIT DRILL 2.8 (BIT) ×2
BIT DRILL 4 DIA CALIBRATED (BIT) ×1 IMPLANT
BIT DRILL CANN QC 2.8X165 (BIT) IMPLANT
BIT DRILL GUIDE PATIENT MATCH (MISCELLANEOUS) IMPLANT
BIT DRILL QC 3.5X110 (BIT) ×1 IMPLANT
BLADE SAGITTAL WIDE XTHICK NO (BLADE) ×3 IMPLANT
CATH FOLEY SIL 2WAY 12FR5CC (CATHETERS) ×1 IMPLANT
CERCLAGE WIRE ×3 IMPLANT
CHLORAPREP W/TINT 26 (MISCELLANEOUS) ×3 IMPLANT
CNTNR SPEC 2.5X3XGRAD LEK (MISCELLANEOUS)
CONT SPEC 4OZ STER OR WHT (MISCELLANEOUS)
CONTAINER SPEC 2.5X3XGRAD LEK (MISCELLANEOUS) ×2 IMPLANT
COOLER POLAR GLACIER W/PUMP (MISCELLANEOUS) ×3 IMPLANT
COVER BACK TABLE REUSABLE LG (DRAPES) ×3 IMPLANT
DERMABOND ADVANCED (GAUZE/BANDAGES/DRESSINGS) ×1
DERMABOND ADVANCED .7 DNX12 (GAUZE/BANDAGES/DRESSINGS) ×2 IMPLANT
DRAPE 3/4 80X56 (DRAPES) ×6 IMPLANT
DRAPE C-ARM XRAY 36X54 (DRAPES) ×1 IMPLANT
DRAPE INCISE IOBAN 66X45 STRL (DRAPES) ×6 IMPLANT
DRAPE U-SHAPE 47X51 STRL (DRAPES) ×3 IMPLANT
DRILL BIT 2.8MM (BIT) ×1
DRILL GUIDE PATIENT MATCH (MISCELLANEOUS) ×3
DRSG OPSITE POSTOP 3X4 (GAUZE/BANDAGES/DRESSINGS) ×3 IMPLANT
DRSG OPSITE POSTOP 4X6 (GAUZE/BANDAGES/DRESSINGS) ×3 IMPLANT
DRSG OPSITE POSTOP 4X8 (GAUZE/BANDAGES/DRESSINGS) ×3 IMPLANT
DRSG TEGADERM 4X10 (GAUZE/BANDAGES/DRESSINGS) ×9 IMPLANT
ELECT REM PT RETURN 9FT ADLT (ELECTROSURGICAL) ×3
ELECTRODE REM PT RTRN 9FT ADLT (ELECTROSURGICAL) ×2 IMPLANT
GAUZE XEROFORM 1X8 LF (GAUZE/BANDAGES/DRESSINGS) ×3 IMPLANT
GLOVE SRG 8 PF TXTR STRL LF DI (GLOVE) ×4 IMPLANT
GLOVE SURG GAMMEX PI TX LF 7.5 (GLOVE) ×6 IMPLANT
GLOVE SURG ORTHO LTX SZ8 (GLOVE) ×6 IMPLANT
GLOVE SURG SYN 8.0 (GLOVE) ×3 IMPLANT
GLOVE SURG SYN 8.0 PF PI (GLOVE) ×2 IMPLANT
GLOVE SURG UNDER POLY LF SZ8 (GLOVE) ×2
GOWN STRL REUS W/ TWL LRG LVL3 (GOWN DISPOSABLE) ×4 IMPLANT
GOWN STRL REUS W/ TWL XL LVL3 (GOWN DISPOSABLE) ×2 IMPLANT
GOWN STRL REUS W/TWL LRG LVL3 (GOWN DISPOSABLE) ×2
GOWN STRL REUS W/TWL XL LVL3 (GOWN DISPOSABLE) ×1
HEMOVAC 400CC 10FR (MISCELLANEOUS) ×3 IMPLANT
HOOD PEEL AWAY FLYTE STAYCOOL (MISCELLANEOUS) ×12 IMPLANT
K-WIRE 5 THRD TROCAR 1.6X150 (WIRE) ×6
KIT STABILIZATION SHOULDER (MISCELLANEOUS) ×3 IMPLANT
KWIRE 5 THRD TROCAR 1.6X150 (WIRE) IMPLANT
MANIFOLD NEPTUNE II (INSTRUMENTS) ×3 IMPLANT
MASK FACE SPIDER DISP (MASK) ×3 IMPLANT
MAT ABSORB  FLUID 56X50 GRAY (MISCELLANEOUS) ×2
MAT ABSORB FLUID 56X50 GRAY (MISCELLANEOUS) ×4 IMPLANT
NDL REVERSE CUT 1/2 CRC (NEEDLE) ×2 IMPLANT
NDL SPNL 20GX3.5 QUINCKE YW (NEEDLE) ×2 IMPLANT
NEEDLE REVERSE CUT 1/2 CRC (NEEDLE) ×3 IMPLANT
NEEDLE SPNL 20GX3.5 QUINCKE YW (NEEDLE) ×3 IMPLANT
NS IRRIG 1000ML POUR BTL (IV SOLUTION) ×3 IMPLANT
P2 COATDE GLNOID BSEPLT 6.5X30 (Shoulder) ×3 IMPLANT
PACK ARTHROSCOPY SHOULDER (MISCELLANEOUS) ×3 IMPLANT
PAD WRAPON POLAR SHDR XLG (MISCELLANEOUS) ×2 IMPLANT
PENCIL SMOKE EVACUATOR (MISCELLANEOUS) ×1 IMPLANT
PROS LCP PLATE 6H 85MM (Plate) ×3 IMPLANT
PROSTHESIS LCP PLATE 6H 85MM (Plate) IMPLANT
PULSAVAC PLUS IRRIG FAN TIP (DISPOSABLE) ×6
SCREW CORTEX 3.5 10MM (Screw) ×1 IMPLANT
SCREW CORTEX 3.5 16MM (Screw) ×1 IMPLANT
SCREW CORTEX 3.5 18MM (Screw) ×1 IMPLANT
SCREW CORTEX 3.5 20MM (Screw) ×2 IMPLANT
SCREW CORTEX 3.5 22MM (Screw) ×2 IMPLANT
SCREW LOCK CORT ST 3.5X10 (Screw) IMPLANT
SCREW LOCK CORT ST 3.5X16 (Screw) IMPLANT
SCREW LOCK CORT ST 3.5X18 (Screw) IMPLANT
SCREW LOCK CORT ST 3.5X20 (Screw) IMPLANT
SCREW LOCK CORT ST 3.5X22 (Screw) IMPLANT
SCREW LOCK T15 FT 10X3.5X2.9X (Screw) IMPLANT
SCREW LOCKING 3.5X10 (Screw) ×2 IMPLANT
SCREW NONLOCK BASEPLATE 3.5X14 (Shoulder) ×1 IMPLANT
SCREW NONLOCK BASEPLATE 3.5X18 (Shoulder) ×1 IMPLANT
SCREW NONLOCK BASEPLATE 3.5X24 (Shoulder) ×1 IMPLANT
SCREW RETAIN W/HEAD 4MM OFFSET (Shoulder) ×1 IMPLANT
SLING ULTRA II LG (MISCELLANEOUS) ×2 IMPLANT
SLING ULTRA II M (MISCELLANEOUS) ×2 IMPLANT
SPONGE T-LAP 18X18 ~~LOC~~+RFID (SPONGE) ×15 IMPLANT
STRAP SAFETY 5IN WIDE (MISCELLANEOUS) ×6 IMPLANT
SUT FIBERWIRE #2 38 BLUE 1/2 (SUTURE) ×12
SUT MNCRL AB 4-0 PS2 18 (SUTURE) ×1 IMPLANT
SUT PROLENE 6 0 P 1 18 (SUTURE) ×2 IMPLANT
SUT TICRON 2-0 30IN 311381 (SUTURE) ×9 IMPLANT
SUT VIC AB 0 CT1 36 (SUTURE) ×3 IMPLANT
SUT VIC AB 2-0 CT2 27 (SUTURE) ×6 IMPLANT
SUT XBRAID 1.4 BLK/WHT (SUTURE) ×1 IMPLANT
SUT XBRAID 1.4 BLUE (SUTURE) ×1 IMPLANT
SUT XBRAID 1.4 WHITE/BLUE (SUTURE) ×1 IMPLANT
SUTURE FIBERWR #2 38 BLUE 1/2 (SUTURE) ×8 IMPLANT
SYR 20ML LL LF (SYRINGE) ×3 IMPLANT
SYR 30ML LL (SYRINGE) ×6 IMPLANT
TIP FAN IRRIG PULSAVAC PLUS (DISPOSABLE) ×2 IMPLANT
TRAY FOLEY SLVR 16FR LF STAT (SET/KITS/TRAYS/PACK) ×1 IMPLANT
WATER STERILE IRR 500ML POUR (IV SOLUTION) ×3 IMPLANT
WIRE CERLCAGE 1.0 280 (WIRE) ×2 IMPLANT
WIRE CERLCAGE COILS 1.0 10 (WIRE) ×1 IMPLANT
WRAPON POLAR PAD SHDR XLG (MISCELLANEOUS) ×3

## 2021-12-23 NOTE — Anesthesia Procedure Notes (Addendum)
Procedure Name: Intubation Date/Time: 12/23/2021 9:11 AM Performed by: Lowry Bowl, CRNA Pre-anesthesia Checklist: Patient identified, Emergency Drugs available, Suction available and Patient being monitored Patient Re-evaluated:Patient Re-evaluated prior to induction Oxygen Delivery Method: Circle system utilized Preoxygenation: Pre-oxygenation with 100% oxygen Induction Type: IV induction Ventilation: Mask ventilation without difficulty Laryngoscope Size: 3 and McGraph Grade View: Grade I Tube type: Oral Tube size: 6.5 mm Number of attempts: 1 Airway Equipment and Method: Stylet and Video-laryngoscopy (Videoscope used electively) Placement Confirmation: ETT inserted through vocal cords under direct vision, positive ETCO2 and breath sounds checked- equal and bilateral Secured at: 19 cm Tube secured with: Tape Dental Injury: Teeth and Oropharynx as per pre-operative assessment

## 2021-12-23 NOTE — Transfer of Care (Signed)
Immediate Anesthesia Transfer of Care Note  Patient: Pamela Ball  Procedure(s) Performed: ATTEMPTED Left reverse shoulder arthroplasty (Left: Shoulder) OPEN REDUCTION INTERNAL FIXATION (ORIF) HUMERAL SHAFT FRACTURE (Left: Arm Upper)  Patient Location: PACU  Anesthesia Type:GA combined with regional for post-op pain  Level of Consciousness: awake, drowsy and patient cooperative  Airway & Oxygen Therapy: Patient Spontanous Breathing  Post-op Assessment: Report given to RN and Post -op Vital signs reviewed and stable  Post vital signs: Reviewed and stable  Last Vitals:  Vitals Value Taken Time  BP 95/51 12/23/21 1515  Temp    Pulse 103 12/23/21 1517  Resp 19 12/23/21 1517  SpO2 91 % 12/23/21 1517  Vitals shown include unvalidated device data.  Last Pain:  Vitals:   12/23/21 0721  TempSrc: Temporal  PainSc: 5        temp 32.2 C  Complications: No notable events documented.

## 2021-12-23 NOTE — Anesthesia Procedure Notes (Signed)
Anesthesia Regional Block: Interscalene brachial plexus block   Pre-Anesthetic Checklist: , timeout performed,  Correct Patient, Correct Site, Correct Laterality,  Correct Procedure, Correct Position, site marked,  Risks and benefits discussed,  Surgical consent,  Pre-op evaluation,  At surgeon's request and post-op pain management  Laterality: Upper and Left  Prep: chloraprep       Needles:  Injection technique: Single-shot  Needle Type: Stimiplex     Needle Length: 9cm  Needle Gauge: 22     Additional Needles:   Procedures:,,,, ultrasound used (permanent image in chart),,    Narrative:  Start time: 12/23/2021 8:20 AM End time: 12/23/2021 8:23 AM Injection made incrementally with aspirations every 20 mL.  Performed by: Personally  Anesthesiologist: Iran Ouch, MD  Additional Notes: Patient consented for risk and benefits of nerve block including but not limited to nerve damage, failed block, bleeding and infection.  Patient voiced understanding.  Functioning IV was confirmed and monitors were applied.  Timeout done prior to procedure and prior to any sedation being given to the patient.  Patient confirmed procedure site prior to any sedation given to the patient.  A 54mm 22ga Stimuplex needle was used. Sterile prep,hand hygiene and sterile gloves were used.  Minimal sedation used for procedure.  No paresthesia endorsed by patient during the procedure.  Negative aspiration and negative test dose prior to incremental administration of local anesthetic. The patient tolerated the procedure well with no immediate complications.

## 2021-12-23 NOTE — Anesthesia Preprocedure Evaluation (Addendum)
Anesthesia Evaluation  Patient identified by MRN, date of birth, ID band Patient awake    Reviewed: Allergy & Precautions, NPO status , Patient's Chart, lab work & pertinent test results  History of Anesthesia Complications Negative for: history of anesthetic complications  Airway Mallampati: III  TM Distance: >3 FB Neck ROM: full    Dental  (+) Missing, Poor Dentition   Pulmonary asthma , COPD,    Pulmonary exam normal        Cardiovascular Exercise Tolerance: Good hypertension, Pt. on medications Normal cardiovascular exam     Neuro/Psych  Headaches, PSYCHIATRIC DISORDERS Bipolar Disorder    GI/Hepatic Neg liver ROS, hiatal hernia, GERD  Medicated and Controlled,  Endo/Other  negative endocrine ROS  Renal/GU      Musculoskeletal  (+) Arthritis , Osteoarthritis,    Abdominal   Peds  Hematology negative hematology ROS (+)   Anesthesia Other Findings Past Medical History: No date: Abdominal hernia with obstruction and without gangrene 02/15/2017: Anxiety     Comment:  denies No date: Asthma 02/15/2017: Bipolar 1 disorder, depressed, full remission (HCC) No date: Bipolar affective disorder ([place]) No date: COPD (chronic obstructive pulmonary disease) ([place]) No date: Depression 02/15/2017: GERD (gastroesophageal reflux disease) No date: Glaucoma No date: Headache No date: Hiatal hernia 05/21/2017: History of abdominal hernia 02/15/2017: History of compression fracture of spine No date: Hyperlipidemia No date: Hypertension No date: Incisional hernia 06/02/2017: Incisional ventral hernia w obstruction 05/23/2017: Normocytic anemia 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, 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  Past Surgical History: 11/06/2006: ABDOMINAL HYSTERECTOMY No date: APPENDECTOMY 12/14/2020: BREAST BIOPSY; Right     Comment:  stereo bx of distortion/asymmetry, coil marker, path                pending No date: BREAST EXCISIONAL BIOPSY; Left     Comment:  age 26- benign  No date: CESAREAN SECTION     Comment:  x3 06/25/2018: COLONOSCOPY WITH PROPOFOL; N/A     Comment:  Procedure: COLONOSCOPY WITH PROPOFOL;  Surgeon: Jonathon Bellows, MD;  Location: [facility] ENDOSCOPY;  Service:               Endoscopy;  Laterality: N/A; No date: KYPHOPLASTY 09/23/2018: KYPHOPLASTY; N/A     Comment:  Procedure: KYPHOPLASTY T10;  Surgeon: Hessie Knows, MD;              Location: [facility] ORS;  Service: Orthopedics;  Laterality:               N/A; 03/11/2020: KYPHOPLASTY; N/A     Comment:  Procedure: L3 KYPHOPLASTY;  Surgeon: Hessie Knows, MD;               Location: [facility] ORS;  Service: Orthopedics;  Laterality:               N/A; 12/30/2020: KYPHOPLASTY; N/A     Comment:  Procedure: L5 KYPHOPLASTY;  Surgeon: Hessie Knows, MD;               Location: [facility] ORS;  Service: Orthopedics;  Laterality:               N/A; 08/07/2019: ORIF HUMERUS FRACTURE; Left     Comment:  Procedure: OPEN REDUCTION INTERNAL FIXATION (ORIF) LEFT               DISTAL HUMERUS FRACTURE;  Surgeon: Altamese Beaver, MD;  Location: [facility];  Service: Orthopedics;  Laterality:               Left; 06/02/2017: VENTRAL HERNIA REPAIR; N/A     Comment:  Procedure: exploratory laparotomy repair of incarcerated              VENTRAL hernia;  Surgeon: Florene Glen, MD;                Location: [facility] ORS;  Service: General;  Laterality: N/A; 08/29/2018: VENTRAL HERNIA REPAIR; N/A     Comment:  Procedure: LAPAROSCOPIC INCISIONAL HERNIA;  Surgeon:               Olean Ree, MD;  Location: [facility] ORS;  Service:               General;  Laterality: N/A;     Reproductive/Obstetrics negative OB ROS                            Anesthesia Physical  Anesthesia Plan  ASA: 3  Anesthesia Plan: General ETT   Post-op Pain Management: Regional block   Induction: Intravenous  PONV Risk Score and  Plan: Ondansetron, Dexamethasone and Treatment may vary due to age or medical condition  Airway Management Planned: Oral ETT  Additional Equipment:   Intra-op Plan:   Post-operative Plan: Extubation in OR  Informed Consent: I have reviewed the patients History and Physical, chart, labs and discussed the procedure including the risks, benefits and alternatives for the proposed anesthesia with the patient or authorized representative who has indicated his/her understanding and acceptance.     Dental advisory given  Plan Discussed with: Anesthesiologist, CRNA and Surgeon  Anesthesia Plan Comments: (Patient and husband consented for risks of anesthesia including but not limited to:  - adverse reactions to medications - damage to eyes, teeth, lips or other oral mucosa - nerve damage due to positioning  - sore throat or hoarseness - Damage to heart, brain, nerves, lungs, other parts of body or loss of life  They voiced understanding.)       Anesthesia Quick Evaluation

## 2021-12-23 NOTE — Anesthesia Procedure Notes (Signed)
Arterial Line Insertion Start/End2/17/2023 12:28 PM, 12/23/2021 12:40 PM Performed by: Iran Ouch, MD, Lowry Bowl, CRNA, CRNA  Patient location: OR. Preanesthetic checklist: patient identified, IV checked, site marked, risks and benefits discussed, surgical consent, monitors and equipment checked, pre-op evaluation, timeout performed and anesthesia consent Patient sedated Right, radial was placed Catheter size: 20 G Hand hygiene performed  and maximum sterile barriers used   Attempts: 2 Procedure performed using ultrasound guided technique. Ultrasound Notes:anatomy identified Following insertion, dressing applied. Post procedure assessment: normal and unchanged  Patient tolerated the procedure well with no immediate complications.

## 2021-12-23 NOTE — H&P (Addendum)
Paper H&P to be scanned into permanent record. H&P reviewed. No significant changes noted.  Initial surgery date was postponed to today due to bilateral lower extremity swelling. Patient evaluated by PCP in the interim and diagnosed with venous insufficiency. Letter received stating OK to proceed with surgery today.

## 2021-12-23 NOTE — Anesthesia Postprocedure Evaluation (Signed)
Anesthesia Post Note  Patient: Pamela Ball  Procedure(s) Performed: ATTEMPTED Left reverse shoulder arthroplasty (Left: Shoulder) OPEN REDUCTION INTERNAL FIXATION (ORIF) HUMERAL SHAFT FRACTURE (Left: Arm Upper)  Patient location during evaluation: PACU Anesthesia Type: General Level of consciousness: awake and alert Pain management: pain level controlled Vital Signs Assessment: post-procedure vital signs reviewed and stable Respiratory status: spontaneous breathing, nonlabored ventilation and respiratory function stable Cardiovascular status: blood pressure returned to baseline and stable Postop Assessment: no apparent nausea or vomiting Anesthetic complications: no   No notable events documented.   Last Vitals:  Vitals:   12/23/21 1610 12/23/21 1615  BP:  (!) 122/52  Pulse: 95 95  Resp: 19 19  Temp:  36.7 C  SpO2: 97% 98%    Last Pain:  Vitals:   12/23/21 1615  TempSrc:   PainSc: 0-No pain                 Iran Ouch

## 2021-12-23 NOTE — Op Note (Addendum)
Operative Note    SURGERY DATE: 12/23/2021   PRE-OP DIAGNOSIS:  1. Left proximal humerus malunion   POST-OP DIAGNOSIS:  1. Left proximal humerus malunion 2. Left proximal humerus and humeral shaft fracture   PROCEDURES:  1. Left partial reverse total shoulder arthroplasty (hemiarthroplasty - glenoid component only) 2. Left biceps tenodesis 3. Left ORIF humeral shaft   SURGEON: Cato Mulligan, MD  ASSISTANTS: Reche Dixon, PA   ANESTHESIA: Gen + Exparel interscalene block   ESTIMATED BLOOD LOSS: 400cc   TOTAL IV FLUIDS: see anesthesia record  IMPLANTS: [employer]: RSP 32-56m Glenoid Head w/Retaining screw; Monoblock Reverse Shoulder Baseplate with 65.3MIcentral screw; 3 non-locking screws into baseplate; no humeral component; Synthes 3.597m6 hole plate with associated screws; 3 cerclage wires   INDICATION(S):  Pamela Ball [age].o. female who sustained a displaced 4-part proximal humerus fracture that was treated nonoperatively. She has had persistent pain and dysfunction of the shoulder. Preoperative imaging consistent with proximal humerus malunion. After discussion of risks, benefits, and alternatives to surgery, the patient elected to proceed with reverse shoulder arthroplasty and biceps tenodesis.   OPERATIVE FINDINGS: Proximal humerus malunion, biceps tendinopathy; proximal humerus and humeral shaft fractures   OPERATIVE REPORT:   I identified Pamela Donathn the pre-operative holding area. Informed consent was obtained and the surgical site was marked. I reviewed the risks and benefits of the proposed surgical intervention and the patient (and/or patient's guardian) wished to proceed. An interscalene block with Exparel was administered by the Anesthesia team. The patient was transferred to the operative suite and general anesthesia was administered. The patient was placed in the beach chair position with the head of the bed elevated approximately 45 degrees. All down  side pressure points were appropriately padded. Pre-op exam under anesthesia confirmed some stiffness and crepitus. Appropriate IV antibiotics were administered. Tranexamic acid was also administered after verifying that the patient had no contraindications. The extremity was then prepped and draped in standard fashion. A time out was performed confirming the correct extremity, correct patient, and correct procedure.   We used the standard deltopectoral incision from the coracoid to ~8cm distal. We found the cephalic vein and took it laterally. We opened the deltopectoral interval widely and placed retractors under the CA ligament in the subacromial space and under the deltoid tendon at its insertion. We then abducted and internally rotated the arm and released the underlying bursa between these retractors, taking care not to damage the circumflex branch of the axillary nerve.   Next, we brought the arm back in adduction at slight forward flexion with external rotation. We opened the clavipectoral fascia lateral to the conjoint tendon.  The arm was then internally rotated, we cut the falciform ligament at approximately 1 cm of the upper portion of the pectoralis major insertion. Next, the biceps tendon was identified in the bicipital groove with severely distorted anatomy proximally. Biceps tendon was erythematous and inflamed. We proceeded with a soft tissue biceps tenodesis given the pathology of the tendon.  Biceps tenodesis was performed with two #2 TiCron sutures to the upper border of the pectoralis major. The proximal portion of the tendon was excised.   The greater and lesser tuberosities were identified. An osteotomy of both was performed. Tag sutures around both tuberosities were placed. The articular cartilage from the humeral head in varus was identified and then removed.  Cancellous bone was removed from the head to be used later as bone graft.  Four #5  Ethibond sutures were placed around the  greater tuberosity fragment. An additional FiberTape and Xbraid Tape was placed around the greater tuberosity to serve as cerclage and tuberosity to implant fin sutures, respectively.    A posterior retractor was used to retract the humeral shaft, exposing the glenoid.  The anterior capsule was dissected free from the subscapularis and it was excised.  We then grasped the labrum and remnant of the biceps tendon and removed it circumferentially. During the glenoid exposure, the axillary nerve was protected the entire time.  We circumferentially released the capsule off of the glenoid and placed appropriate retractors to gain adequate visualization.  The central guidepin was drilled using preoperatively planned custom guide. A tap was placed, matching the trajectory of the guidepin. An appropriately sized reamer was used to ream the glenoid. The monoblock baseplate was inserted and adequate fixation was achieved. Non-locking compression screws were utilized to improve fixation. The peripheral screws were drilled, measured, and placed. A 32-4 glenosphere with screw was placed.    We then turned our attention to the humerus. We sequentially used larger diameter canal finders until we met some resistance at a size 10. We broached to the size 8 at 20 degrees of retroversion. We placed a neutral poly trial.  However, the joint could not be reduced. A size 6 was utilized and this was seated further. The joint could still not be reduced. A very small piece of calcar bone had broken off on attempted seating. This was removed and the implant was seated further but joint could still not be reduced. Implants were removed and proximal humeral was cut further. On attempted seating of the implant, a large spike of anterior/medial humeral bone was noted to be broken. A cerclage wire was used to achieve fixation of this piece. The humeral trial was seated again and before attempting joint reduction, there was noted to be  discontinuity between the proximal humerus and humeral shaft. Intraoperative fluoroscopy showed a humeral shaft fracture.   Incision was carried down distally for improved exposure. Anterior deltoid was partially released. This allowed for exposure of the fracture site. Prior cerclage wire was removed to allow for improved visualization of the fracture and to allow a better reduction. The fracture was reduced with a reduction clamp. A 3.36m plate was placed along the lateral humerus. Plate was fixed to the humerus distal to the fracture site using two 3.570mcortical screws. Proximal fixation was achieved with one 3.74m73mortical screw and one unicortical locking screw. Plan at this point was to place a cement restriction proximal to the screws from the plate and use a cemented short humeral stem. The trial implant was placed and the prior anteromedial cortical spike was then reduced with a cerclage wire. On attempted reduction, there was noted to be a separate greater tuberosity fracture involving most of the tuberosity. Trial implant was removed to better understand this fracture. Two further cerclage wires were placed. This construct achieved fixation of the humeral fracture fragments and the entire humerus moved as a unit. However, there was no appropriate humeral canal in which to place a humeral stem. Therefore, at this point, decision was made to let the humeral shaft fracture heal and plan for revision humeral stem placement at a later date. The lesser tuberosity and greater tuberosity fragments were tagged and had suture tied around them for identification. Of note, the cephalic vein was damaged during fixation of the humeral shaft fracture and was ligated with electrocautery.  The wound  was thoroughly irrigated. Vancomycin powder was placed.  A Hemovac drain was placed. We closed the deltopectoral interval with a running, 0-Vicryl suture. The skin was closed with 2-0 Vicryl and staples. Xeroform and  Honeycomb dressing were applied. A PolarCare unit and sling were placed. Patient was extubated, transferred to a stretcher bed and to the post anesthesia care unit in stable condition.    Of note, assistance from a PA was essential to performing the surgery. PA assisted with patient positioning, exposure, retraction, instrumentation, and wound closure. The surgery would have been more difficult and had longer operative time without PA assistance.   Additionally, this case had significantly increased complexity and surgical time compared to standard shoulder arthroplasty procedure. This was due to this being a proximal humerus malunion.  This caused significantly distorted anatomy which required more careful and meticulous dissection, adding to surgical time.  Additionally, the patient was contracted from malunion leading to difficulty in joint reduction. Furthermore, the patient had severely osteoporotic bone contributing to multiple intraoperative fractures despite meticulous technique. All of these factors added approximately 3 hours to the surgical time compared to that for a standard reverse shoulder arthroplasty.   POSTOPERATIVE PLAN: Operative arm to remain in sling. Can perform pendulums, elbow/wrist/hand RoM exercises. DVT ppx with ASA to start on POD#1. Patient to be admitted overnight. Patient to return to clinic in ~1-2 weeks for post-operative appointment.

## 2021-12-24 DIAGNOSIS — K2100 Gastro-esophageal reflux disease with esophagitis, without bleeding: Secondary | ICD-10-CM | POA: Diagnosis not present

## 2021-12-24 DIAGNOSIS — M6259 Muscle wasting and atrophy, not elsewhere classified, multiple sites: Secondary | ICD-10-CM | POA: Diagnosis not present

## 2021-12-24 DIAGNOSIS — S42302P Unspecified fracture of shaft of humerus, left arm, subsequent encounter for fracture with malunion: Secondary | ICD-10-CM | POA: Diagnosis not present

## 2021-12-24 DIAGNOSIS — R2681 Unsteadiness on feet: Secondary | ICD-10-CM | POA: Diagnosis not present

## 2021-12-24 DIAGNOSIS — M6281 Muscle weakness (generalized): Secondary | ICD-10-CM | POA: Diagnosis not present

## 2021-12-24 DIAGNOSIS — F32A Depression, unspecified: Secondary | ICD-10-CM | POA: Diagnosis not present

## 2021-12-24 DIAGNOSIS — Z743 Need for continuous supervision: Secondary | ICD-10-CM | POA: Diagnosis not present

## 2021-12-24 DIAGNOSIS — Z4789 Encounter for other orthopedic aftercare: Secondary | ICD-10-CM | POA: Diagnosis not present

## 2021-12-24 DIAGNOSIS — F01518 Vascular dementia, unspecified severity, with other behavioral disturbance: Secondary | ICD-10-CM | POA: Diagnosis not present

## 2021-12-24 DIAGNOSIS — I2510 Atherosclerotic heart disease of native coronary artery without angina pectoris: Secondary | ICD-10-CM | POA: Diagnosis not present

## 2021-12-24 DIAGNOSIS — S42293P Other displaced fracture of upper end of unspecified humerus, subsequent encounter for fracture with malunion: Secondary | ICD-10-CM | POA: Diagnosis not present

## 2021-12-24 DIAGNOSIS — R6889 Other general symptoms and signs: Secondary | ICD-10-CM | POA: Diagnosis not present

## 2021-12-24 DIAGNOSIS — K219 Gastro-esophageal reflux disease without esophagitis: Secondary | ICD-10-CM | POA: Diagnosis present

## 2021-12-24 DIAGNOSIS — M810 Age-related osteoporosis without current pathological fracture: Secondary | ICD-10-CM | POA: Diagnosis not present

## 2021-12-24 DIAGNOSIS — Z741 Need for assistance with personal care: Secondary | ICD-10-CM | POA: Diagnosis not present

## 2021-12-24 DIAGNOSIS — W19XXXD Unspecified fall, subsequent encounter: Secondary | ICD-10-CM | POA: Diagnosis present

## 2021-12-24 DIAGNOSIS — Z7401 Bed confinement status: Secondary | ICD-10-CM | POA: Diagnosis not present

## 2021-12-24 DIAGNOSIS — J449 Chronic obstructive pulmonary disease, unspecified: Secondary | ICD-10-CM | POA: Diagnosis not present

## 2021-12-24 DIAGNOSIS — I10 Essential (primary) hypertension: Secondary | ICD-10-CM | POA: Diagnosis not present

## 2021-12-24 DIAGNOSIS — M170 Bilateral primary osteoarthritis of knee: Secondary | ICD-10-CM | POA: Diagnosis present

## 2021-12-24 DIAGNOSIS — E785 Hyperlipidemia, unspecified: Secondary | ICD-10-CM | POA: Diagnosis not present

## 2021-12-24 DIAGNOSIS — Z736 Limitation of activities due to disability: Secondary | ICD-10-CM | POA: Diagnosis not present

## 2021-12-24 DIAGNOSIS — Z20822 Contact with and (suspected) exposure to covid-19: Secondary | ICD-10-CM | POA: Diagnosis present

## 2021-12-24 DIAGNOSIS — E569 Vitamin deficiency, unspecified: Secondary | ICD-10-CM | POA: Diagnosis not present

## 2021-12-24 DIAGNOSIS — J45909 Unspecified asthma, uncomplicated: Secondary | ICD-10-CM | POA: Diagnosis not present

## 2021-12-24 DIAGNOSIS — S42242P 4-part fracture of surgical neck of left humerus, subsequent encounter for fracture with malunion: Secondary | ICD-10-CM | POA: Diagnosis present

## 2021-12-24 DIAGNOSIS — Z90710 Acquired absence of both cervix and uterus: Secondary | ICD-10-CM | POA: Diagnosis not present

## 2021-12-24 DIAGNOSIS — Z79899 Other long term (current) drug therapy: Secondary | ICD-10-CM | POA: Diagnosis not present

## 2021-12-24 LAB — BASIC METABOLIC PANEL
Anion gap: 4 — ABNORMAL LOW (ref 5–15)
BUN: 15 mg/dL (ref 8–23)
CO2: 26 mmol/L (ref 22–32)
Calcium: 7.8 mg/dL — ABNORMAL LOW (ref 8.9–10.3)
Chloride: 107 mmol/L (ref 98–111)
Creatinine, Ser: 0.72 mg/dL (ref 0.44–1.00)
GFR, Estimated: >60 mL/min (ref >60)
Glucose, Bld: 109 mg/dL — ABNORMAL HIGH (ref 70–99)
Potassium: 3.9 mmol/L (ref 3.5–5.1)
Sodium: 137 mmol/L (ref 135–145)

## 2021-12-24 LAB — CBC
HCT: 27.6 % — ABNORMAL LOW (ref 36.0–46.0)
Hemoglobin: 8.9 g/dL — ABNORMAL LOW (ref 12.0–15.0)
MCH: 31.3 pg (ref 26.0–34.0)
MCHC: 32.2 g/dL (ref 30.0–36.0)
MCV: 97.2 fL (ref 80.0–100.0)
Platelets: 217 10*3/uL (ref 150–400)
RBC: 2.84 MIL/uL — ABNORMAL LOW (ref 3.87–5.11)
RDW: 14.6 % (ref 11.5–15.5)
WBC: 7.1 10*3/uL (ref 4.0–10.5)
nRBC: 0 % (ref 0.0–0.2)

## 2021-12-24 MED ORDER — ONDANSETRON HCL 4 MG PO TABS
4.0000 mg | ORAL_TABLET | Freq: Four times a day (QID) | ORAL | 0 refills | Status: DC | PRN
Start: 2021-12-24 — End: 2022-02-16

## 2021-12-24 MED ORDER — OXYCODONE HCL 5 MG PO TABS: 5.0000 mg | ORAL_TABLET | ORAL | 0 refills | Status: DC | PRN

## 2021-12-24 MED ORDER — ASPIRIN 325 MG PO TBEC: 325.0000 mg | DELAYED_RELEASE_TABLET | Freq: Every day | ORAL | 0 refills | Status: DC

## 2021-12-24 NOTE — Progress Notes (Signed)
°  Subjective: 1 Day Post-Op Procedure(s) (LRB): ATTEMPTED Left reverse shoulder arthroplasty (Left) OPEN REDUCTION INTERNAL FIXATION (ORIF) HUMERAL SHAFT FRACTURE (Left) Patient reports pain as moderate.   Patient is well, and has had no acute complaints or problems Plan is to go  to [facility] with a transfer to another orthopedic surgeon  after hospital stay. Negative for chest pain and shortness of breath Fever: no Gastrointestinal: Negative for nausea and vomiting  Objective: Vital signs in last 24 hours: Temp:  [97.3 F (36.3 C)-98.6 F (37 C)] 98.5 F (36.9 C) (02/18 0443) Pulse Rate:  [87-103] 103 (02/18 0443) Resp:  [15-28] 20 (02/18 0443) BP: (97-167)/(49-78) 154/65 (02/18 0443) SpO2:  [91 %-100 %] 91 % (02/18 0443) Arterial Line BP: (117-143)/(41-49) 143/49 (02/17 1555) Weight:  [44 kg] 44 kg (02/17 0721)  Intake/Output from previous day:  Intake/Output Summary (Last 24 hours) at 12/24/2021 0654 Last data filed at 12/24/2021 8185 Gross per 24 hour  Intake 3724.97 ml  Output 1645 ml  Net 2079.97 ml    Intake/Output this shift: Total I/O In: -  Out: 470 [Urine:400; Drains:70]  Labs: Recent Labs    12/23/21 1317 12/24/21 0446  HGB 11.2* 8.9*   Recent Labs    12/23/21 1317 12/24/21 0446  WBC 11.2* 7.1  RBC 3.64* 2.84*  HCT 34.7* 27.6*  PLT 262 217   Recent Labs    12/24/21 0446  NA 137  K 3.9  CL 107  CO2 26  BUN 15  CREATININE 0.72  GLUCOSE 109*  CALCIUM 7.8*   No results for input(s): LABPT, INR in the last 72 hours.   EXAM General - Patient is Alert and Oriented Extremity - Neurovascular intact Sensation intact distally Dorsiflexion/Plantar flexion intact Dressing/Incision - clean, dry, with the Hemovac removed by the nurse this morning. Motor Function - intact, moving fingers well on exam.   Past Medical History:  Diagnosis Date   Abdominal hernia with obstruction and without gangrene    Anxiety 02/15/2017   denies   Asthma     Bipolar 1 disorder, depressed, full remission ([place]) 02/15/2017   Bipolar affective disorder ([facility])    COPD (chronic obstructive pulmonary disease) (H[facility])    Depression    GERD (gastroesophageal reflux disease) 02/15/2017   Glaucoma    Headache    Hiatal hernia    History of abdominal hernia 05/21/2017   History of compression fracture of spine 02/15/2017   Hyperlipidemia    Hypertension    Incisional hernia    Incisional ventral hernia w obstruction 06/02/2017   Normocytic anemia 05/23/2017   Osteoarthritis of knees, 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   Presbycusis of both ears 02/15/2017    Assessment/Plan: 1 Day Post-Op Procedure(s) (LRB): ATTEMPTED Left reverse shoulder arthroplasty (Left) OPEN REDUCTION INTERNAL FIXATION (ORIF) HUMERAL SHAFT FRACTURE (Left) Principal Problem:   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   Weight as of this encounter: 44 kg. Advance diet Up with therapy  Discharge planning to Dr Dorna Bloom, at [facility], for definitive care of the shoulder.  DVT Prophylaxis - Aspirin left shoulder to remain in the shoulder sling at all times.    Reche Dixon, PA-C Orthopaedic Surgery 12/24/2021, 6:54 AM

## 2021-12-24 NOTE — Evaluation (Addendum)
Physical Therapy Evaluation Patient Details Name: Pamela Ball MRN: [id number] DOB: 04/15/1946 Today's Date: 12/24/2021  History of Present Illness  Pt is a 76 y/o F who underwent ORIF of L humeral shaft fx by Dr. Posey Pronto on 12/23/21 as pt had a closed 4-part fx of proximal humerus with malunion. PMH: abdominal hernia, anxiety, asthma, bipolar 1 disorder, COPD, depression, glaucoma, HLD, HTN, B knee OA  Clinical Impression  Pt seen for PT evaluation with husband present. Prior to admission pt was living with husband & receiving HHPT & OT services. On this date, pt is only oriented to self & requires mulitmodal cuing to follow simple commands. PT assists pt with sit>stand x 2-3 attempts from EOB but pt with poor ability to weight shift & take steps along EOB. Pt assisted with stand pivot with pt requiring manual facilitation for pivoting portion. Pt states throughout session "I can't walk" with pt's husband reporting she broke her leg at the end of this past summer. Pt appears to have significant cognitive deficits & while husband reports this is new s/p surgery, he does not seem to be alarmed by it. At this time, pt would benefit from STR upon d/c to maximize independence with functional mobility & reduce fall risk prior to return home.        Recommendations for follow up therapy are one component of a multi-disciplinary discharge planning process, led by the attending physician.  Recommendations may be updated based on patient status, additional functional criteria and insurance authorization.  Follow Up Recommendations Skilled nursing-short term rehab (<3 hours/day)    Assistance Recommended at Discharge Frequent or constant Supervision/Assistance  Patient can return home with the following  A lot of help with walking and/or transfers;A lot of help with bathing/dressing/bathroom;Direct supervision/assist for medications management;Assist for transportation;Direct supervision/assist for  financial management;Assistance with cooking/housework    Equipment Recommendations None recommended by PT  Recommendations for Other Services       Functional Status Assessment Patient has had a recent decline in their functional status and demonstrates the ability to make significant improvements in function in a reasonable and predictable amount of time.     Precautions / Restrictions Precautions Precautions: Fall Restrictions Weight Bearing Restrictions: Yes LUE Weight Bearing: Non weight bearing      Mobility  Bed Mobility               General bed mobility comments: not observed as pt received EOB & left sitting in recliner    Transfers Overall transfer level: Needs assistance Equipment used: 1 person hand held assist Transfers: Sit to/from Stand, Bed to chair/wheelchair/BSC Sit to Stand: Min assist Stand pivot transfers: Mod assist         General transfer comment: PT provides RUE HHA for sit>stand. Requires cuing for technique/sequencing & ultimately manual facilitation to pivot to recliner on R as pt unable to weight shift to advance feet.    Ambulation/Gait                  Stairs            Wheelchair Mobility    Modified Rankin (Stroke Patients Only)       Balance Overall balance assessment: Needs assistance Sitting-balance support: Feet supported Sitting balance-Leahy Scale: Good Sitting balance - Comments: supervision static sitting EOB     Standing balance-Leahy Scale: Zero  Pertinent Vitals/Pain Pain Assessment Pain Assessment: Faces Faces Pain Scale: Hurts a little bit Pain Location: L shoulder Pain Descriptors / Indicators: Grimacing, Guarding Pain Intervention(s): Monitored during session    Home Living Family/patient expects to be discharged to:: Private residence Living Arrangements: Spouse/significant other Available Help at Discharge: Family;Available 24  hours/day Type of Home: House Home Access: Ramped entrance       Home Layout: One level Home Equipment: Erin - manual;BSC/3in1;Rolling Environmental consultant (2 wheels) (hemiwalker)      Prior Function Prior Level of Function : Needs assist       Physical Assist : Mobility (physical);ADLs (physical) Mobility (physical): Gait ADLs (physical): Bathing;Dressing;Toileting;IADLs Mobility Comments: sees HHPT for gait w/ walker before shoulder sx, requires WC for into/out of the home ADLs Comments: requires assist from spouse for bathing/dressing, but he reports she can usually feed and groom herself.     Hand Dominance   Dominant Hand: Left    Extremity/Trunk Assessment   Upper Extremity Assessment Upper Extremity Assessment: Defer to OT evaluation (as noted below) RUE Deficits / Details: ROM WFL, MMT grossly 4-/5 LUE Deficits / Details: grip MMT grossly 3/5, shld PROM tolerated to ~80-90 degrees. Unable to fully assess d/t pain and precuations    Lower Extremity Assessment Lower Extremity Assessment: Generalized weakness;Difficult to assess due to impaired cognition       Communication   Communication: HOH  Cognition Arousal/Alertness: Awake/alert Behavior During Therapy: WFL for tasks assessed/performed Overall Cognitive Status: Impaired/Different from baseline Area of Impairment: Orientation, Attention, Memory, Following commands, Safety/judgement, Problem solving, Awareness                 Orientation Level: Disoriented to, Place, Time, Situation   Memory: Decreased recall of precautions, Decreased short-term memory Following Commands: Follows one step commands with increased time Safety/Judgement: Decreased awareness of deficits, Decreased awareness of safety Awareness: Anticipatory, Emergent, Intellectual Problem Solving: Slow processing, Decreased initiation, Requires verbal cues, Requires tactile cues General Comments: her spouse reports she is usually  clearer mentally, but is somewhat vauge so unsure of baseline cognition. Today, she requires increased processing time with simple cues and is only oriented to self.        General Comments General comments (skin integrity, edema, etc.): Pt on 2L/min via nasal cannula with pt requesting to remove it at end of session with pt on room air briefly & SpO2 93 dropping to 90% & husband providing cuing to "breathe, breathe" so PT donned nasal cannula at 2L again. HR 123 bpm decreasing to 116 bpm at end of session sitting in recliner. Pt with total body trembling but denies being cold. Pt's husband reports pt can't see and when asked if she's blind pt replies, partly.    Exercises     Assessment/Plan    PT Assessment Patient needs continued PT services  PT Problem List Decreased strength;Decreased mobility;Decreased safety awareness;Decreased range of motion;Decreased knowledge of precautions;Cardiopulmonary status limiting activity;Decreased cognition;Decreased activity tolerance;Decreased balance;Decreased knowledge of use of DME;Pain       PT Treatment Interventions DME instruction;Therapeutic exercise;Gait training;Balance training;Neuromuscular re-education;Manual techniques;Modalities;Functional mobility training;Cognitive remediation;Therapeutic activities;Patient/family education    PT Goals (Current goals can be found in the Care Plan section)  Acute Rehab PT Goals Patient Stated Goal: get better PT Goal Formulation: With patient/family Time For Goal Achievement: 01/07/22 Potential to Achieve Goals: Fair    Frequency 7X/week     Co-evaluation               AM-PAC  PT "6 Clicks" Mobility  Outcome Measure Help needed turning from your back to your side while in a flat bed without using bedrails?: A Lot Help needed moving from lying on your back to sitting on the side of a flat bed without using bedrails?: A Lot Help needed moving to and from a bed to a chair (including a  wheelchair)?: A Lot Help needed standing up from a chair using your arms (e.g., wheelchair or bedside chair)?: A Lot Help needed to walk in hospital room?: Total Help needed climbing 3-5 steps with a railing? : Total 6 Click Score: 10    End of Session Equipment Utilized During Treatment: Gait belt (LUE sling) Activity Tolerance: Patient tolerated treatment well Patient left: in chair;with chair alarm set;with call bell/phone within reach;with family/visitor present Nurse Communication: Mobility status PT Visit Diagnosis: Difficulty in walking, not elsewhere classified (R26.2);History of falling (Z91.81);Unsteadiness on feet (R26.81);Muscle weakness (generalized) (M62.81)    Time: 6195-0932 PT Time Calculation (min) (ACUTE ONLY): 17 min   Charges:   PT Evaluation $PT Eval Moderate Complexity: Mitchell, PT, DPT 12/24/21, 10:25 AM   Waunita Schooner 12/24/2021, 9:49 AM

## 2021-12-24 NOTE — Progress Notes (Signed)
Vitals entered manually- dynamap didn't send over to computer

## 2021-12-24 NOTE — Discharge Summary (Addendum)
Physician Discharge Summary  Subjective: 4 Days Post-Op Procedure(s) (LRB): ATTEMPTED Left reverse shoulder arthroplasty (Left) OPEN REDUCTION INTERNAL FIXATION (ORIF) HUMERAL SHAFT FRACTURE (Left) Patient reports pain as moderate.   Patient seen in rounds with Dr. Posey Pronto. Patient is well, and has had no acute complaints or problems Patient is ready to go to rehab  Physician Discharge Summary  Patient ID: Pamela Ball MRN: [id number] DOB/AGE: 1946-02-10 76 y.o.  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  The patient has done minimal physical therapy with transfers.  She is still having pain but is very limited with shoulder motion.  She is still in a shoulder sling.  Her vitals have remained stable.  Treatments: surgery:  1. Left partial reverse total shoulder arthroplasty (hemiarthroplasty - glenoid component only) 2. Left biceps tenodesis 3. Left ORIF humeral shaft   SURGEON: Cato Mulligan, MD   ASSISTANTS: Reche Dixon, PA   ANESTHESIA: Gen + Exparel interscalene block   ESTIMATED BLOOD LOSS: 400cc   TOTAL IV FLUIDS: see anesthesia record   IMPLANTS: [employer] Surgical: RSP 32-58mm Glenoid Head w/Retaining screw; Monoblock Reverse Shoulder Baseplate with 7.5IE central screw; 3 non-locking screws into baseplate; no humeral component; Synthes 3.29mm 6 hole plate with associated screws; 3 cerclage wires    Discharge Exam: Blood pressure (!) 122/53, pulse 92, temperature 98.2 F (36.8 C), resp. rate 16, height 4\' 10"  (1.473 m), weight 44 kg, SpO2 92 %.   Disposition: Discharge disposition: 03-Skilled Nursing Facility        Allergies as of 12/27/2021       Reactions    Tape Other (See Comments)   Pulls skin off        Medication List     STOP taking these medications    permethrin 5 % cream Commonly known as: ELIMITE       TAKE these medications    albuterol 108 (90 Base) MCG/ACT inhaler Commonly known as: VENTOLIN HFA INHALE 2 PUFFS INTO THE LUNGS EVERY 4 HOURS AS NEEDED FOR WHEEZING OR SHORTNESS OF BREATH (COUGH) What changed: See the new instructions.   alendronate 70 MG tablet Commonly known as: FOSAMAX Take 70 mg by mouth every Wednesday. Take with a full glass of water. Do not lie down for the next 30 min.   ALPRAZolam 0.5 MG tablet Commonly known as: XANAX Take 1 tablet (0.5 mg total) by mouth 2 (two) times daily as needed for anxiety.   amLODipine 10 MG tablet Commonly known as: NORVASC TAKE 1 TABLET BY MOUTH EVERY DAY   aspirin 325 MG EC tablet Take 1 tablet (325 mg total) by mouth daily.   B-12 PO Take 1 capsule by mouth daily.   BC FAST PAIN RELIEF PO Take 1 packet by mouth 4 (four) times daily as needed (pain).   buPROPion 150 MG 24 hr tablet Commonly known as: WELLBUTRIN XL Take 150 mg by mouth daily.   busPIRone 10 MG tablet Commonly known as: BUSPAR Take 10 mg by mouth daily. Prescribed by Psychiatry Dr Kasandra Knudsen   cyanocobalamin 1000 MCG tablet Take 3 tablets (3,000 mcg total) by mouth daily.   docusate sodium 100 MG capsule Commonly known as: COLACE Take 1 capsule (100 mg total) by mouth  2 (two) times daily. What changed: when to take this   CARNATION BREAKFAST ESSENTIALS PO Take 1 Dose by mouth daily.   feeding supplement Liqd Take 237 mLs by mouth 3 (three) times daily between meals.   furosemide 20 MG tablet Commonly known as: LASIX Take 1 tablet (20 mg total) by mouth daily as needed for fluid or edema.   multivitamin with minerals Tabs tablet Take 1 tablet by mouth daily.   OLANZapine 7.5 MG tablet Commonly known as: ZYPREXA Take 7.5 mg by mouth daily.   omeprazole 20 MG capsule Commonly  known as: PRILOSEC TAKE 1 CAPSULE BY MOUTH DAILY BEFORE BREAKFAST.   ondansetron 4 MG disintegrating tablet Commonly known as: ZOFRAN-ODT TAKE 1 TABLET BY MOUTH EVERY 8 HOURS AS NEEDED FOR NAUSEA AND VOMITING   ondansetron 4 MG tablet Commonly known as: ZOFRAN Take 1 tablet (4 mg total) by mouth every 6 (six) hours as needed for nausea.   oxyCODONE 5 MG immediate release tablet Commonly known as: Oxy IR/ROXICODONE Take 1-2 tablets (5-10 mg total) by mouth every 4 (four) hours as needed for moderate pain (pain score 4-6). What changed:  how much to take when to take this reasons to take this   PRESERVISION AREDS PO Take 1 tablet by mouth daily.   simvastatin 20 MG tablet Commonly known as: ZOCOR Take 1 tablet (20 mg total) by mouth daily.   venlafaxine XR 75 MG 24 hr capsule Commonly known as: EFFEXOR-XR Take 75 mg by mouth daily with breakfast.   VITAMIN C PO Take 1 tablet by mouth daily.   VITAMIN D PO Take 1 capsule by mouth daily.        Contact information for after-discharge care     Destination     [facility] .   Service: Skilled Nursing Contact information: [address] [place] [phone]                     Signed: Prescott Parma, [place] 12/27/2021, 2:47 PM   Objective: Vital signs in last 24 hours: Temp:  [97.9 F (36.6 C)-98.6 F (37 C)] 98.2 F (36.8 C) (02/21 1211) Pulse Rate:  [92-108] 92 (02/21 1211) Resp:  [15-18] 16 (02/21 1211) BP: (116-146)/(49-68) 122/53 (02/21 1211) SpO2:  [90 %-100 %] 92 % (02/21 1211)  Intake/Output from previous day:  Intake/Output Summary (Last 24 hours) at 12/27/2021 1447 Last data filed at 12/27/2021 0507 Gross per 24 hour  Intake 240 ml  Output 300 ml  Net -60 ml    Intake/Output this shift: No intake/output data recorded.  Labs: No results for input(s): HGB in the last 72 hours. No results for input(s): WBC, RBC, HCT, PLT in the last  72 hours. No results for input(s): NA, K, CL, CO2, BUN, CREATININE, GLUCOSE, CALCIUM in the last 72 hours. No results for input(s): LABPT, INR in the last 72 hours.  EXAM: General - Patient is Alert and Oriented Extremity - Neurovascular intact Sensation intact distally Incision - clean, dry, no drainage Motor Function -grip strength is limited.  Able to flex and extend her fingers.  No shoulder range of motion.  Assessment/Plan: 4 Days Post-Op Procedure(s) (LRB): ATTEMPTED Left reverse shoulder arthroplasty (Left) OPEN REDUCTION INTERNAL FIXATION (ORIF) HUMERAL SHAFT FRACTURE (Left) Procedure(s) (LRB): ATTEMPTED Left reverse shoulder arthroplasty (Left) OPEN REDUCTION INTERNAL FIXATION (ORIF) HUMERAL SHAFT FRACTURE (Left) Past Medical History:  Diagnosis Date   Abdominal hernia with obstruction and without gangrene  Anxiety 02/15/2017   denies   Asthma    Bipolar 1 disorder, depressed, full remission ([place]) 02/15/2017   Bipolar affective disorder ([place])    COPD (chronic obstructive pulmonary disease) (HCC)    Depression    GERD (gastroesophageal reflux disease) 02/15/2017   Glaucoma    Headache    Hiatal hernia    History of abdominal hernia 05/21/2017   History of compression fracture of spine 02/15/2017   Hyperlipidemia    Hypertension    Incisional hernia    Incisional ventral hernia w obstruction 06/02/2017   Normocytic anemia 05/23/2017   Osteoarthritis of knees, 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   Presbycusis of both ears 02/15/2017   Principal Problem:   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   Weight as of this encounter: 44 kg. Advance diet Up with therapy Discharge planning to rehab today.  Plan to follow-up with Dr. Dorna Bloom at [facility] for definitive left shoulder surgery.  Plan 4 to 6 weeks for repeat surgery Diet - Regular diet Follow up  - in the next few weeks Activity -shoulder sling with no use of the left arm. Disposition - Rehab Condition Upon Discharge - Stable DVT Prophylaxis - Aspirin  Reche Dixon, PA-C Orthopaedic Surgery 12/27/2021, 2:47 PM

## 2021-12-24 NOTE — Discharge Instructions (Signed)
Pamela Mulligan, MD  [facility]  Phone: [phone]  Fax: [phone]    We will be getting you an appointment with Dr. Lennon Alstrom at [facility].  You will follow up with him in the office.   Discharge Instructions after Reverse Shoulder Replacement    1. Activity/Sling: You are to be non-weight bearing on operative extremity. A sling/shoulder immobilizer has been provided for you. Only remove the sling to perform elbow, wrist, and hand RoM exercises and hygiene/dressing. Active reaching and lifting are not permitted. You will be given further instructions on sling use at your first physical therapy visit and postoperative visit with Dr. Posey Pronto.   2. Dressings: Dressing may be removed at 1st physical therapy visit (~3-4 days after surgery). Afterwards, you may either leave open to air (if no drainage) or cover with dry, sterile dressing. If you have steri-strips on your wound, please do not remove them. They will fall off on their own. You may shower 5 days after surgery. Please pat incision dry. Do not rub or place any shear forces across incision. If there is drainage or any opening of incision after 5 days, please notify our offices immediately.    3. Driving:  Plan on not driving for six weeks. Please note that you are advised NOT to drive while taking narcotic pain medications as you may be impaired and unsafe to drive.   4. Medications:  - You have been provided a prescription for narcotic pain medicine (usually oxycodone). After surgery, take 1-2 narcotic tablets every 4 hours if needed for severe pain. Please start this as soon as you begin to start having pain (if you received a nerve block, start taking as soon as this wears off).  - A prescription for anti-nausea medication will be provided in case the narcotic medicine causes nausea - take 1 tablet every 6 hours only if nauseated.  - Take enteric coated aspirin 325 mg once daily for 6 weeks to prevent blood clots. Do not take aspirin if  you have an aspirin sensitivity/allergy or asthma or are on an anticoagulant (blood thinner) already. If so, then your home anticoagulant will be resume and managed - do not take aspirin. -Take tylenol 1000mg  (2 Extra strength or 3 regular strength tablets) every 8 hours for pain. This will reduce the amount of narcotic medication needed. May stop tylenol when you are having minimal pain. - Take a stool softener (Colace, Dulcolax or Senakot) if you are using narcotic pain medications to help with constipation that is associated with narcotic use. - DO NOT take ANY nonsteroidal anti-inflammatory pain medications: Advil, Motrin, Ibuprofen, Aleve, Naproxen, or Naprosyn.   If you are taking prescription medication for anxiety, depression, insomnia, muscle spasm, chronic pain, or for attention deficit disorder you are advised that you are at a higher risk of adverse effects with use of narcotics post-op, including narcotic addiction/dependence, depressed breathing, death. If you use non-prescribed substances: alcohol, marijuana, cocaine, heroin, methamphetamines, etc., you are at a higher risk of adverse effects with use of narcotics post-op, including narcotic addiction/dependence, depressed breathing, death. You are advised that taking > 50 morphine milligram equivalents (MME) of narcotic pain medication per day results in twice the risk of overdose or death. For your prescription provided: oxycodone 5 mg - taking more than 6 tablets per day after the first few days of surgery.     6. Work: May do light duty/desk job in approximately 2 weeks when off of narcotics, pain is well-controlled, and swelling  has decreased if able to function with one arm in sling. Full work may take 6 weeks if light motions and function of both arms is required. Lifting jobs may require 12 weeks.   7. Post-Op Appointments: Your first post-op appointment will be with Dr. Posey Pronto in approximately 2 weeks time.    If you find that  they have not been scheduled please call the Orthopaedic Appointment front desk at [phone].                Pamela Mulligan, MD [facility] Phone: [phone] Fax: [phone]

## 2021-12-24 NOTE — Evaluation (Signed)
Occupational Therapy Evaluation Patient Details Name: Pamela Ball MRN: [id number] DOB: 10/13/1946 Today's Date: 12/24/2021   History of Present Illness Pt is a 76 y/o F who underwent ORIF of L humeral shaft fx by Dr. Posey Pronto on 12/23/21 as pt had a closed 4-part fx of proximal humerus with malunion. PMH: abdominal hernia, anxiety, asthma, bipolar 1 disorder, COPD, depression, glaucoma, HLD, HTN, B knee OA   Clinical Impression   Pt seen for OT evaluation this date in setting of acute hospitalization s/p ORIF of L humerus. At baseline, she lives at home with spouse and has been requiring assistance with bathing and dressing since fall and surgery in august of 2022. She has been using RW for fxl mobility with HHPT before shoulder issues, also attempted QC at one point, but this apparently caused one of pt's mechanical falls per her spouse. Pt with limited activity tolerance, generalized deconditioning and L shoulder pain today. She requires: MOD A for UB ADLs, MAX A For LB ADLs, MAX A for sling and polar care mgt, MIN A for ADL Transfers with hemi walker. She only tolerates stand for 2-3 seconds. Cues for sequencing/safety throughout. OT ed with pt and spouse re:  polar care mgt, sling mgt, hand and wrist exercise, pendulums, permissive ROM for ADLs, UB ADL task modification. Pt left seated EOB for PT to start session/assessment. Will continue to follow acutely. Recommend f/u OT services in STR setting d/t extensive need for assistance in all aspects of ADLs/mobility at this time.      Recommendations for follow up therapy are one component of a multi-disciplinary discharge planning process, led by the attending physician.  Recommendations may be updated based on patient status, additional functional criteria and insurance authorization.   Follow Up Recommendations  Skilled nursing-short term rehab (<3 hours/day)    Assistance Recommended at Discharge Frequent or constant Supervision/Assistance   Patient can return home with the following A lot of help with walking and/or transfers;A lot of help with bathing/dressing/bathroom;Assistance with cooking/housework;Direct supervision/assist for medications management;Direct supervision/assist for financial management;Assistance with feeding;Assist for transportation;Help with stairs or ramp for entrance    Functional Status Assessment  Patient has had a recent decline in their functional status and demonstrates the ability to make significant improvements in function in a reasonable and predictable amount of time.  Equipment Recommendations  None recommended by OT (has all necessary equipment)    Recommendations for Other Services       Precautions / Restrictions Precautions Precautions: Fall Restrictions Weight Bearing Restrictions: Yes LUE Weight Bearing: Non weight bearing      Mobility Bed Mobility Overal bed mobility: Needs Assistance Bed Mobility: Supine to Sit     Supine to sit: Mod assist, HOB elevated     General bed mobility comments: increased time and cues for hand placement    Transfers Overall transfer level: Needs assistance Equipment used: Hemi-walker Transfers: Sit to/from Stand Sit to Stand: Min assist           General transfer comment: cues for safe use      Balance Overall balance assessment: Needs assistance Sitting-balance support: Feet supported Sitting balance-Leahy Scale: Good     Standing balance support: Single extremity supported Standing balance-Leahy Scale: Poor Standing balance comment: tolerates two very short-lived stands with hemi walker for 2-3 seconds                           ADL either performed or assessed  with clinical judgement   ADL                                         General ADL Comments: MOD A for UB ADLs, MAX A For LB ADLs, MAX A for sling and polar care mgt, MIN A for ADL Transfers with hemi walker. Cues for sequencing/safety  throughout.     Vision Patient Visual Report: No change from baseline       Perception     Praxis      Pertinent Vitals/Pain Pain Assessment Pain Assessment: Faces Faces Pain Scale: Hurts even more Pain Location: L UE with minimal manipulation for sling and polar care adjustment Pain Descriptors / Indicators: Grimacing, Guarding Pain Intervention(s): Limited activity within patient's tolerance, Monitored during session, Repositioned     Hand Dominance Left   Extremity/Trunk Assessment Upper Extremity Assessment Upper Extremity Assessment: Generalized weakness;RUE deficits/detail;LUE deficits/detail RUE Deficits / Details: ROM WFL, MMT grossly 4-/5 LUE Deficits / Details: grip MMT grossly 3/5, shld PROM tolerated to ~80-90 degrees. Unable to fully assess d/t pain and precuations           Communication Communication Communication: HOH   Cognition Arousal/Alertness: Awake/alert Behavior During Therapy: WFL for tasks assessed/performed Overall Cognitive Status: Impaired/Different from baseline Area of Impairment: Orientation, Attention, Memory, Following commands, Safety/judgement, Problem solving                 Orientation Level: Disoriented to, Place, Time, Situation Current Attention Level: Sustained Memory: Decreased recall of precautions, Decreased short-term memory Following Commands: Follows one step commands with increased time Safety/Judgement: Decreased awareness of deficits   Problem Solving: Slow processing, Decreased initiation, Requires verbal cues, Requires tactile cues General Comments: her spouse reports she is usually clearer mentally, but is somewhat vauge so unsure of baseline cognition. Today, she requires increased processing time with simple cues and is only oriented to self.     General Comments       Exercises Other Exercises Other Exercises: Ed with pt and spouse re: polar care mgt, sling mgt, hand and wrist exercise, pendulums,  permissive ROM for ADLs, UB ADL task modification.   Shoulder Instructions      Home Living Family/patient expects to be discharged to:: Private residence Living Arrangements: Spouse/significant other Available Help at Discharge: Family;Available 24 hours/day Type of Home: House Home Access: Ramped entrance     Home Layout: One level         Bathroom Toilet: Standard     Home Equipment: [place] - manual;BSC/3in1;Rolling Environmental consultant (2 wheels) (hemiwalker)          Prior Functioning/Environment Prior Level of Function : Needs assist       Physical Assist : Mobility (physical);ADLs (physical) Mobility (physical): Gait ADLs (physical): Bathing;Dressing;Toileting;IADLs Mobility Comments: sees HHPT for gait w/ walker before shoulder sx, requires WC for into/out of the home ADLs Comments: requires assist from spouse for bathing/dressing, but he reports she can usually feed and groom herself.        OT Problem List: Decreased strength;Decreased activity tolerance      OT Treatment/Interventions: Self-care/ADL training;Therapeutic exercise;Therapeutic activities;DME and/or AE instruction;Patient/family education    OT Goals(Current goals can be found in the care plan section) Acute Rehab OT Goals Patient Stated Goal: to go home OT Goal Formulation: With patient/family Time For Goal Achievement: 01/07/22 Potential to Achieve Goals: [place]  OT  Frequency: Min 2X/week    Co-evaluation              AM-PAC OT "6 Clicks" Daily Activity     Outcome Measure Help from another person eating meals?: A Little Help from another person taking care of personal grooming?: A Little Help from another person toileting, which includes using toliet, bedpan, or urinal?: Total Help from another person bathing (including washing, rinsing, drying)?: A Lot Help from another person to put on and taking off regular upper body clothing?: A Lot Help from another person to put on and  taking off regular lower body clothing?: Total 6 Click Score: 12   End of Session Equipment Utilized During Treatment: Gait belt;Other (comment) (hemi walker) Nurse Communication: Mobility status  Activity Tolerance: Patient tolerated treatment well Patient left: Other (comment) (with PT presenting to room for assessment)  OT Visit Diagnosis: Unsteadiness on feet (R26.81);Muscle weakness (generalized) (M62.81)                Time: 6045-4098 OT Time Calculation (min): 41 min Charges:  OT General Charges $OT Visit: 1 Visit OT Evaluation $OT Eval Moderate Complexity: 1 Mod OT Treatments $Self Care/Home Management : 8-22 mins $Therapeutic Activity: 8-22 mins  Gerrianne Scale, MS, OTR/L ascom [phone] 12/24/21, 9:47 AM

## 2021-12-25 NOTE — NC FL2 (Signed)
[place] LEVEL OF CARE SCREENING TOOL     IDENTIFICATION  Patient Name: Pamela Ball Birthdate: [birth year] Sex: female Admission Date (Current Location): 12/23/2021  [facility] and [place] Number:  Engineering geologist and Address:  [facility], [address] [place], [place] [address]      Provider Number:    Attending Physician Name and Address:  Leim Fabry, MD  Relative Name and Phone Number:  Melane Windholz [phone]    Current Level of Care: SNF Recommended Level of Care: [place] Prior Approval Number:    Date Approved/Denied:   PASRR Number: [id number]  Discharge Plan: SNF    Current Diagnoses: Patient Active Problem List   Diagnosis Date Noted   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 12/23/2021   Venous insufficiency of both lower extremities 12/13/2021   Closed comminuted intra-articular fracture of distal femur, left, initial encounter ([place]) 06/23/2021   Closed fracture of multiple pubic rami, left, initial encounter ([place]) 06/23/2021   Accidental fall    Preoperative clearance    Recurrent falls    Macrocytosis 03/08/2021   Abnormal SPEP 03/08/2021   Rib pain on right side 08/06/2020   Essential hypertension 06/28/2020   Osteopenia of right foot 02/11/2019   Malnutrition of moderate degree 12/27/2018   COPD (chronic obstructive pulmonary disease) ([place]) 12/26/2018   Lumbar hernia 11/19/2018   Elevated troponin 09/26/2018   Incisional hernia, without obstruction or gangrene 08/28/2018   Allergic rhinitis due to allergen 05/14/2018   Abdominal hernia with obstruction and without gangrene    Normocytic anemia 05/23/2017   History of abdominal hernia 05/21/2017   Bipolar 1 disorder, depressed, full remission ([place]) 02/15/2017   GERD (gastroesophageal reflux disease) 02/15/2017   Centrilobular emphysema ([place]) 02/15/2017   Osteoarthritis of knees, 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    Hyperlipidemia 02/15/2017   Presbycusis of both ears 02/15/2017   History of compression fracture of spine 02/15/2017   Anxiety 02/15/2017    Orientation RESPIRATION BLADDER Height & Weight     Self  Normal Continent Weight: 97 lb (44 kg) Height:  4\' 10"  (147.3 cm)  BEHAVIORAL SYMPTOMS/MOOD NEUROLOGICAL BOWEL NUTRITION STATUS      Continent Diet  AMBULATORY STATUS COMMUNICATION OF NEEDS Skin   Extensive Assist Verbally Normal                       Personal Care Assistance Level of Assistance  Bathing, Dressing, Feeding Bathing Assistance: Limited assistance Feeding assistance: Limited assistance Dressing Assistance: Limited assistance     Functional Limitations Info  Speech, Hearing, Sight Sight Info: Adequate Hearing Info: Adequate Speech Info: Adequate    SPECIAL CARE FACTORS FREQUENCY  PT (By licensed PT), OT (By licensed OT)     PT Frequency: 5x per week OT Frequency: 5x per week            Contractures Contractures Info: Not present    Additional Factors Info  Code Status Code Status Info: full code             Current Medications (12/25/2021):  This is the current hospital active medication list Current Facility-Administered Medications  Medication Dose Route Frequency Provider Last Rate Last Admin   0.9 %  sodium chloride infusion   Intravenous Continuous Leim Fabry, MD 75 mL/hr at 12/25/21 0244 New Bag at 12/25/21 0244   albuterol (PROVENTIL) (2.5 MG/3ML) 0.083% nebulizer solution 3 mL  3 mL Inhalation Q4H PRN Leim Fabry, MD  alum & mag hydroxide-simeth (MAALOX/MYLANTA) 200-200-20 MG/5ML suspension 30 mL  30 mL Oral Q4H PRN Leim Fabry, MD       amLODipine (NORVASC) tablet 10 mg  10 mg Oral Daily Leim Fabry, MD   10 mg at 12/25/21 1002   aspirin EC tablet 325 mg  325 mg Oral Daily Leim Fabry, MD   325 mg at 12/25/21 1001   bisacodyl (DULCOLAX) suppository 10 mg  10 mg Rectal Daily PRN Leim Fabry, MD       buPROPion (WELLBUTRIN  XL) 24 hr tablet 150 mg  150 mg Oral Daily Leim Fabry, MD   150 mg at 12/25/21 1001   busPIRone (BUSPAR) tablet 10 mg  10 mg Oral Daily Leim Fabry, MD   10 mg at 12/25/21 1002   cholecalciferol (VITAMIN D3) tablet 500 Units  500 Units Oral Daily Leim Fabry, MD   500 Units at 12/25/21 1000   docusate sodium (COLACE) capsule 100 mg  100 mg Oral BID Leim Fabry, MD   100 mg at 12/25/21 1001   furosemide (LASIX) tablet 20 mg  20 mg Oral Daily PRN Leim Fabry, MD       HYDROmorphone (DILAUDID) injection 0.2-0.4 mg  0.2-0.4 mg Intravenous Q4H PRN Leim Fabry, MD       menthol-cetylpyridinium (CEPACOL) lozenge 3 mg  1 lozenge Oral PRN Leim Fabry, MD       Or   phenol (CHLORASEPTIC) mouth spray 1 spray  1 spray Mouth/Throat PRN Leim Fabry, MD       metoCLOPramide (REGLAN) tablet 5-10 mg  5-10 mg Oral Q8H PRN Leim Fabry, MD       Or   metoCLOPramide (REGLAN) injection 5-10 mg  5-10 mg Intravenous Q8H PRN Leim Fabry, MD       multivitamin with minerals tablet 1 tablet  1 tablet Oral Daily Leim Fabry, MD   1 tablet at 12/25/21 1001   nutrition supplement (JUVEN) (JUVEN) powder packet   Oral Daily Leim Fabry, MD   1 packet at 12/25/21 0959   OLANZapine (ZYPREXA) tablet 7.5 mg  7.5 mg Oral Daily Leim Fabry, MD   7.5 mg at 12/25/21 1002   ondansetron (ZOFRAN) tablet 4 mg  4 mg Oral Q6H PRN Leim Fabry, MD       Or   ondansetron [facility]) injection 4 mg  4 mg Intravenous Q6H PRN Leim Fabry, MD       oxyCODONE (Oxy IR/ROXICODONE) immediate release tablet 10-15 mg  10-15 mg Oral Q4H PRN Leim Fabry, MD   10 mg at 12/25/21 1011   oxyCODONE (Oxy IR/ROXICODONE) immediate release tablet 5-10 mg  5-10 mg Oral Q4H PRN Leim Fabry, MD   10 mg at 12/24/21 1245   pantoprazole (PROTONIX) EC tablet 40 mg  40 mg Oral Daily Leim Fabry, MD   40 mg at 12/25/21 1001   senna-docusate (Senokot-S) tablet 1 tablet  1 tablet Oral QHS PRN Leim Fabry, MD       simvastatin (ZOCOR) tablet 20 mg  20 mg  Oral Daily Leim Fabry, MD   20 mg at 12/25/21 1002   venlafaxine XR (EFFEXOR-XR) 24 hr capsule 75 mg  75 mg Oral Q breakfast Leim Fabry, MD   75 mg at 12/25/21 1191     Discharge Medications: Please see discharge summary for a list of discharge medications.  Relevant Imaging Results:  Relevant Lab Results:   Additional Information SS# [id number]  Elliot Gurney [place], [place]

## 2021-12-25 NOTE — Progress Notes (Signed)
Physical Therapy Treatment Patient Details Name: Pamela Ball MRN: [id number] DOB: [birth year] Today's Date: 12/25/2021   History of Present Illness Pt is a 76 y/o F who underwent ORIF of L humeral shaft fx by Dr. Posey Pronto on 12/23/21 as pt had a closed 4-part fx of proximal humerus with malunion. PMH: abdominal hernia, anxiety, asthma, bipolar 1 disorder, COPD, depression, glaucoma, HLD, HTN, B knee OA    PT Comments    Pt ready for session.  Does report fear of fall.  To EOB with mod a x 1.  Steady in sitting and time spent talking with pt to improve comfort level with new therapist.  She is able to stand with min a x 1 and transfer to recliner with min a x 1.  Seated AROM BLE.  Overall tolerated mobility well today.   Recommendations for follow up therapy are one component of a multi-disciplinary discharge planning process, led by the attending physician.  Recommendations may be updated based on patient status, additional functional criteria and insurance authorization.  Follow Up Recommendations  Skilled nursing-short term rehab (<3 hours/day)     Assistance Recommended at Discharge Frequent or constant Supervision/Assistance  Patient can return home with the following A lot of help with walking and/or transfers;A lot of help with bathing/dressing/bathroom;Direct supervision/assist for medications management;Assist for transportation;Direct supervision/assist for financial management;Assistance with cooking/housework   Equipment Recommendations  None recommended by PT    Recommendations for Other Services       Precautions / Restrictions Precautions Precautions: Fall Restrictions Weight Bearing Restrictions: Yes LUE Weight Bearing: Non weight bearing     Mobility  Bed Mobility Overal bed mobility: Needs Assistance Bed Mobility: Supine to Sit     Supine to sit: Mod assist, HOB elevated          Transfers Overall transfer level: Needs assistance Equipment used: 1  person hand held assist Transfers: Sit to/from Stand, Bed to chair/wheelchair/BSC Sit to Stand: Min assist Stand pivot transfers: Min assist         General transfer comment: improved today    Ambulation/Gait               General Gait Details: no true gait but is able to take small steps to chair.   Stairs             Wheelchair Mobility    Modified Rankin (Stroke Patients Only)       Balance Overall balance assessment: Needs assistance Sitting-balance support: Feet supported Sitting balance-Leahy Scale: Good Sitting balance - Comments: supervision static sitting EOB     Standing balance-Leahy Scale: Poor Standing balance comment: improved today                            Cognition Arousal/Alertness: Awake/alert Behavior During Therapy: WFL for tasks assessed/performed Overall Cognitive Status: Impaired/Different from baseline                                 General Comments: pt stated she is going home today and husband stated no, rehab        Exercises Other Exercises Other Exercises: seated AROM at EOB    General Comments        Pertinent Vitals/Pain Pain Assessment Pain Assessment: Faces Faces Pain Scale: Hurts a little bit Pain Location: L shoulder Pain Descriptors / Indicators: Grimacing, Guarding Pain Intervention(s): Limited activity within  patient's tolerance, Monitored during session, Repositioned, Ice applied    Home Living                          Prior Function            PT Goals (current goals can now be found in the care plan section) Progress towards PT goals: Progressing toward goals    Frequency    7X/week      PT Plan Current plan remains appropriate    Co-evaluation              AM-PAC PT "6 Clicks" Mobility   Outcome Measure  Help needed turning from your back to your side while in a flat bed without using bedrails?: A Lot Help needed moving from lying on  your back to sitting on the side of a flat bed without using bedrails?: A Lot Help needed moving to and from a bed to a chair (including a wheelchair)?: A Lot Help needed standing up from a chair using your arms (e.g., wheelchair or bedside chair)?: A Lot Help needed to walk in hospital room?: Total Help needed climbing 3-5 steps with a railing? : Total 6 Click Score: 10    End of Session Equipment Utilized During Treatment: Gait belt Activity Tolerance: Patient tolerated treatment well Patient left: in chair;with chair alarm set;with call bell/phone within reach;with family/visitor present Nurse Communication: Mobility status PT Visit Diagnosis: Difficulty in walking, not elsewhere classified (R26.2);History of falling (Z91.81);Unsteadiness on feet (R26.81);Muscle weakness (generalized) (M62.81)     Time: 8832-5498 PT Time Calculation (min) (ACUTE ONLY): 14 min  Charges:  $Therapeutic Activity: 8-22 mins                    Chesley Noon, PTA 12/25/21, 10:44 AM

## 2021-12-25 NOTE — Plan of Care (Signed)

## 2021-12-25 NOTE — Progress Notes (Signed)
°  Subjective: 2 Days Post-Op Procedure(s) (LRB): ATTEMPTED Left reverse shoulder arthroplasty (Left) OPEN REDUCTION INTERNAL FIXATION (ORIF) HUMERAL SHAFT FRACTURE (Left) Patient reports pain as moderate.   Patient is well, and has had no acute complaints or problems Plan is to go  to [facility] with a transfer to another orthopedic surgeon  after hospital stay. Negative for chest pain and shortness of breath Fever: no Gastrointestinal: Negative for nausea and vomiting  Objective: Vital signs in last 24 hours: Temp:  [98.4 F (36.9 C)-98.6 F (37 C)] 98.4 F (36.9 C) (02/19 0338) Pulse Rate:  [84-110] 110 (02/19 0338) Resp:  [18] 18 (02/19 0338) BP: (118-146)/(57-65) 146/62 (02/19 0338) SpO2:  [93 %-97 %] 96 % (02/19 0338)  Intake/Output from previous day:  Intake/Output Summary (Last 24 hours) at 12/25/2021 0605 Last data filed at 12/24/2021 1800 Gross per 24 hour  Intake 780 ml  Output 820 ml  Net -40 ml    Intake/Output this shift: No intake/output data recorded.  Labs: Recent Labs    12/23/21 1317 12/24/21 0446  HGB 11.2* 8.9*   Recent Labs    12/23/21 1317 12/24/21 0446  WBC 11.2* 7.1  RBC 3.64* 2.84*  HCT 34.7* 27.6*  PLT 262 217   Recent Labs    12/24/21 0446  NA 137  K 3.9  CL 107  CO2 26  BUN 15  CREATININE 0.72  GLUCOSE 109*  CALCIUM 7.8*   No results for input(s): LABPT, INR in the last 72 hours.   EXAM General - Patient is Alert and Oriented Extremity - Neurovascular intact Sensation intact distally Dorsiflexion/Plantar flexion intact Dressing/Incision - clean, dry, with the Hemovac removed by the nurse this morning. Motor Function - intact, moving fingers well on exam.   Past Medical History:  Diagnosis Date   Abdominal hernia with obstruction and without gangrene    Anxiety 02/15/2017   denies   Asthma    Bipolar 1 disorder, depressed, full remission ([place]) 02/15/2017   Bipolar affective disorder (HCC)    COPD (chronic obstructive  pulmonary disease) (HCC)    Depression    GERD (gastroesophageal reflux disease) 02/15/2017   Glaucoma    Headache    Hiatal hernia    History of abdominal hernia 05/21/2017   History of compression fracture of spine 02/15/2017   Hyperlipidemia    Hypertension    Incisional hernia    Incisional ventral hernia w obstruction 06/02/2017   Normocytic anemia 05/23/2017   Osteoarthritis of knees, 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   Presbycusis of both ears 02/15/2017    Assessment/Plan: 2 Days Post-Op Procedure(s) (LRB): ATTEMPTED Left reverse shoulder arthroplasty (Left) OPEN REDUCTION INTERNAL FIXATION (ORIF) HUMERAL SHAFT FRACTURE (Left) Principal Problem:   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   Weight as of this encounter: 44 kg. Advance diet Up with therapy  Discharge planning home today.  Plan to follow-up with Dr Dorna Bloom, at [facility], for definitive care of the shoulder.  DVT Prophylaxis - Aspirin left shoulder to remain in the shoulder sling at all times.    Reche Dixon, PA-C Orthopaedic Surgery 12/25/2021, 6:05 AM

## 2021-12-25 NOTE — TOC Initial Note (Signed)
Transition of Care [facility]) - Initial/Assessment Note    Patient Details  Name: Pamela Ball MRN: [id number] Date of Birth: 26-Jun-1946  Transition of Care [facility]) CM/SW Contact:    Elliot Gurney [place], [place] Phone Number:[phone] 12/25/2021, 10:18 AM  Clinical Narrative:                 This Education officer, museum spoke with patient's spouse regarding recommendation for rehab following patient's hospitalization. Per patient's spouse, patient has been at [facility] in the past and would be agreeable to her return. Per patient's spouse, patient has a wheelchair, walker and cain in the home.Fl2 faxed over to [facility] for review.  Transition of Care to follow  Sheralyn Boatman Transition of Care [phone]   Expected Discharge Plan: [place]     Patient Goals and CMS Choice Patient states their goals for this hospitalization and ongoing recovery are:: "Not sure we have much choice" CMS Medicare.gov Compare Post Acute Care list provided to:: Patient Represenative (must comment)    Expected Discharge Plan and Services Expected Discharge Plan: [place] In-house Referral: Clinical Social Work     Living arrangements for the past 2 months: Single Family Home Expected Discharge Date: 12/25/21                                    Prior Living Arrangements/Services Living arrangements for the past 2 months: Single Family Home Lives with:: Spouse   Do you feel safe going back to the place where you live?: Yes (after rehab)      Need for Family Participation in Patient Care: Yes (Comment) Care giver support system in place?: Yes (comment) Current home services: Home PT, Home OT, Home RN ([place]) Criminal Activity/Legal Involvement Pertinent to Current Situation/Hospitalization: No - Comment as needed  Activities of Daily Living Home Assistive Devices/Equipment: None ADL Screening (condition at time of admission) Patient's  cognitive ability adequate to safely complete daily activities?: No Is the patient deaf or have difficulty hearing?: Yes Does the patient have difficulty seeing, even when wearing glasses/contacts?: Yes Does the patient have difficulty concentrating, remembering, or making decisions?: No Patient able to express need for assistance with ADLs?: Yes Does the patient have difficulty dressing or bathing?: No Independently performs ADLs?: Yes (appropriate for developmental age) Does the patient have difficulty walking or climbing stairs?: Yes Weakness of Legs: Left Weakness of Arms/Hands: Left  Permission Sought/Granted         Permission granted to share info w AGENCY: [facility]  Permission granted to share info w Relationship: spouse  Permission granted to share info w Contact Information: [phone]  Emotional Assessment   Attitude/Demeanor/Rapport: Unable to Assess     Alcohol / Substance Use: Not Applicable Psych Involvement: No (comment)  Admission diagnosis:  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 [S42.293P] Patient Active Problem List   Diagnosis Date Noted   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 12/23/2021   Venous insufficiency of both lower extremities 12/13/2021   Closed comminuted intra-articular fracture of distal femur, left, initial encounter ([place]) 06/23/2021   Closed fracture of multiple pubic rami, left, initial encounter ([place]) 06/23/2021   Accidental fall    Preoperative clearance    Recurrent falls    Macrocytosis 03/08/2021   Abnormal SPEP 03/08/2021   Rib pain on right side 08/06/2020   Essential hypertension 06/28/2020   Osteopenia of  right foot 02/11/2019   Malnutrition of moderate degree 12/27/2018   COPD (chronic obstructive pulmonary disease) ([place]) 12/26/2018   Lumbar hernia 11/19/2018   Elevated troponin 09/26/2018   Incisional hernia, without obstruction or gangrene 08/28/2018   Allergic rhinitis due to allergen  05/14/2018   Abdominal hernia with obstruction and without gangrene    Normocytic anemia 05/23/2017   History of abdominal hernia 05/21/2017   Bipolar 1 disorder, depressed, full remission ([place]) 02/15/2017   GERD (gastroesophageal reflux disease) 02/15/2017   Centrilobular emphysema ([place]) 02/15/2017   Osteoarthritis of knees, 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   Hyperlipidemia 02/15/2017   Presbycusis of both ears 02/15/2017   History of compression fracture of spine 02/15/2017   Anxiety 02/15/2017   PCP:  Olin Hauser, DO Pharmacy:   [id number] [place], [place] [address] [address] Phone: [phone] Fax: [phone]  [facility] ([place], [place] [facility] [address] [place] [address] Phone: [phone] Fax: [phone]  [id number] - [place], [place] [address] [place] [address] Phone: [phone] Fax: [phone]     Social Determinants of Health (SDOH) Interventions    Readmission Risk Interventions No flowsheet data found.

## 2021-12-25 NOTE — Progress Notes (Signed)
Occupational Therapy Treatment Patient Details Name: Pamela Ball MRN: [id number] DOB: [birth year] Today's Date: 12/25/2021   History of present illness Pt is a 76 y/o F who underwent ORIF of L humeral shaft fx by Dr. Posey Pronto on 12/23/21 as pt had a closed 4-part fx of proximal humerus with malunion. PMH: abdominal hernia, anxiety, asthma, bipolar 1 disorder, COPD, depression, glaucoma, HLD, HTN, B knee OA   OT comments  Chart reviewed to date, pt greeted in bedside chair. Pt is reporting feeling aches throughout her body, requires vcs for participation. Pt husband present throughout evaluation. Pt and husband educated again on sling mgt, hand and wrist exercise, permissive ROM for ADLs, UB ADL task modification via explantation, verbal demonstration, and hand out. Pt required physical assist for all AROM of elbow, wrist, hand and demonstrates fair participation. Pendulums not appropriate to attempt at this time as pt attempts STS and is unable to maintain standing for more than 5 seconds on 2 attempts. Pt is left as received as lunch arrives, NAD, all needs met. Pt declines further ADL participation. Nurse notified of pt reported discomfort. Discharge recommendation remains appropriate. OT will continue to follow.    Recommendations for follow up therapy are one component of a multi-disciplinary discharge planning process, led by the attending physician.  Recommendations may be updated based on patient status, additional functional criteria and insurance authorization.    Follow Up Recommendations  Skilled nursing-short term rehab (<3 hours/day)    Assistance Recommended at Discharge Frequent or constant Supervision/Assistance  Patient can return home with the following  A lot of help with walking and/or transfers;A lot of help with bathing/dressing/bathroom;Assistance with cooking/housework;Direct supervision/assist for medications management;Direct supervision/assist for financial  management;Assistance with feeding;Assist for transportation;Help with stairs or ramp for entrance   Equipment Recommendations  None recommended by OT    Recommendations for Other Services      Precautions / Restrictions Precautions Precautions: Fall;Shoulder Shoulder Interventions: Shoulder sling/immobilizer;At all times;Off for dressing/bathing/exercises Restrictions Weight Bearing Restrictions: Yes LUE Weight Bearing: Non weight bearing       Mobility Bed Mobility                    Transfers                         Balance Overall balance assessment: Needs assistance Sitting-balance support: Feet supported Sitting balance-Leahy Scale: Good     Standing balance support: Single extremity supported Standing balance-Leahy Scale: Poor                             ADL either performed or assessed with clinical judgement   ADL                                         General ADL Comments: MAX A to doff sling, MIN A 2x for STS with hemi walker. Step by step verbal cues required throughout    [facility] Assessment              Vision       Perception     Praxis      Cognition Arousal/Alertness: Awake/alert Behavior During Therapy: Flat affect Overall Cognitive Status: Impaired/Different from baseline Area of Impairment: Attention, Following commands, Problem solving, Awareness  Current Attention Level: Sustained Memory: Decreased recall of precautions, Decreased short-term memory Following Commands: Follows one step commands with increased time Safety/Judgement: Decreased awareness of deficits, Decreased awareness of safety   Problem Solving: Slow processing, Decreased initiation, Difficulty sequencing, Requires verbal cues, Requires tactile cues          Exercises Shoulder Exercises Elbow Flexion: Left, AAROM, 10 reps, Seated Elbow Extension: Left, AAROM, 10 reps,  Seated Wrist Flexion: Left, 10 reps, Seated Wrist Extension: Left, AAROM, 10 reps, Seated Digit Composite Flexion: Left, AAROM, 10 reps, Seated Composite Extension: Left, AAROM, 10 reps, Seated    Shoulder Instructions       General Comments      Pertinent Vitals/ Pain       Pain Assessment Pain Assessment: Faces Faces Pain Scale: Hurts even more Pain Location: generalized Pain Descriptors / Indicators: Grimacing, Guarding, Aching Pain Intervention(s): Limited activity within patient's tolerance, Monitored during session, Other (comment) (nurse notified via secure chat)  Home Living                                          Prior Functioning/Environment              Frequency  Min 2X/week        Progress Toward Goals  OT Goals(current goals can now be found in the care plan section)  Progress towards OT goals: Progressing toward goals  Acute Rehab OT Goals Patient Stated Goal: to go home OT Goal Formulation: With patient/family Time For Goal Achievement: 01/08/22 Potential to Achieve Goals: [place] Discharge plan remains appropriate    Co-evaluation                 AM-PAC OT "6 Clicks" Daily Activity     Outcome Measure   Help from another person eating meals?: A Little Help from another person taking care of personal grooming?: A Little Help from another person toileting, which includes using toliet, bedpan, or urinal?: Total Help from another person bathing (including washing, rinsing, drying)?: A Lot Help from another person to put on and taking off regular upper body clothing?: A Lot Help from another person to put on and taking off regular lower body clothing?: Total 6 Click Score: 12    End of Session Equipment Utilized During Treatment: Other (comment) (hemi walker)  OT Visit Diagnosis: Unsteadiness on feet (R26.81);Muscle weakness (generalized) (M62.81)   Activity Tolerance Patient limited by pain;Patient limited  by fatigue   Patient Left in chair;with call bell/phone within reach;with chair alarm set;with family/visitor present   Nurse Communication Patient requests pain meds;Mobility status        Time: 1205-1219 OT Time Calculation (min): 14 min  Charges: OT General Charges $OT Visit: 1 Visit OT Treatments $Therapeutic Exercise: 8-22 mins  Shanon Payor, OTD OTR/L  12/25/21, 12:45 PM

## 2021-12-26 ENCOUNTER — Encounter: Payer: Self-pay | Admitting: Orthopedic Surgery

## 2021-12-26 NOTE — Plan of Care (Signed)
Patient sleeping between care. Aox2-3. No new changes in assessment. Call bell within reach. PLAN OF CARE ONGOING Problem: Education: Goal: Knowledge of the prescribed therapeutic regimen will improve Outcome: Progressing Goal: Understanding of activity limitations/precautions following surgery will improve Outcome: Progressing Goal: Individualized Educational Video(s) Outcome: Progressing   Problem: Activity: Goal: Ability to tolerate increased activity will improve Outcome: Progressing   Problem: Pain Management: Goal: Pain level will decrease with appropriate interventions Outcome: Progressing

## 2021-12-26 NOTE — Progress Notes (Signed)
Occupational Therapy Treatment Patient Details Name: Pamela Ball MRN: [id number] DOB: [birth year] Today's Date: 12/26/2021   History of present illness Pt is a 76 y/o F who underwent ORIF of L humeral shaft fx by Dr. Posey Pronto on 12/23/21 as pt had a closed 4-part fx of proximal humerus with malunion. PMH: abdominal hernia, anxiety, asthma, bipolar 1 disorder, COPD, depression, glaucoma, HLD, HTN, B knee OA   OT comments  Upon entering room, pt supine in bed with very tangential speech in regards to husband and needing frequent redirection to attend to session. Pt performing bed mobility with mod A to EOB. While sitting sling adjusted for proper positioning. Pt combing part of hair but needing assistance for thoroughness. OT assisted pt with braiding and pt maintained seated balance with supervision on EOB. Pt standing with min A but quickly returning to EOB. Lateral scoots towards the L (head of bed ) and pt returning to supine with assistance. Pt declined self care tasks and getting into recliner chair. OT assisted pt with repositioning in bed for comfort. All needs within reach. Bed alarm activated. Pt continues to benefit from OT intervention with recommendation for short term rehab to address functional deficits at discharge.    Recommendations for follow up therapy are one component of a multi-disciplinary discharge planning process, led by the attending physician.  Recommendations may be updated based on patient status, additional functional criteria and insurance authorization.    Follow Up Recommendations  Skilled nursing-short term rehab (<3 hours/day)    Assistance Recommended at Discharge Frequent or constant Supervision/Assistance  Patient can return home with the following  A lot of help with walking and/or transfers;A lot of help with bathing/dressing/bathroom;Assistance with cooking/housework;Direct supervision/assist for medications management;Direct supervision/assist for financial  management;Assistance with feeding;Assist for transportation;Help with stairs or ramp for entrance   Equipment Recommendations  Other (comment) (defer to next venue of care)       Precautions / Restrictions Precautions Precautions: Fall;Shoulder Shoulder Interventions: Shoulder sling/immobilizer;At all times;Off for dressing/bathing/exercises Restrictions Weight Bearing Restrictions: Yes LUE Weight Bearing: Non weight bearing       Mobility Bed Mobility Overal bed mobility: Needs Assistance Bed Mobility: Supine to Sit     Supine to sit: Mod assist, HOB elevated          Transfers Overall transfer level: Needs assistance Equipment used: 1 person hand held assist Transfers: Sit to/from Stand Sit to Stand: Min assist Stand pivot transfers: Min assist               Balance Overall balance assessment: Needs assistance Sitting-balance support: Feet supported Sitting balance-Leahy Scale: Good Sitting balance - Comments: supervision static sitting EOB   Standing balance support: Single extremity supported Standing balance-Leahy Scale: Poor                             ADL either performed or assessed with clinical judgement   ADL                                         General ADL Comments: max A for repositioning of sling. Pt stands with min A  from EOB.     Vision Patient Visual Report: No change from baseline            Cognition Arousal/Alertness: Awake/alert Behavior During Therapy: Anxious Overall Cognitive Status: Impaired/Different  from baseline Area of Impairment: Attention, Following commands, Problem solving, Awareness                 Orientation Level: Disoriented to, Place, Time, Situation Current Attention Level: Sustained Memory: Decreased recall of precautions, Decreased short-term memory Following Commands: Follows one step commands with increased time Safety/Judgement: Decreased awareness of  deficits, Decreased awareness of safety Awareness: Intellectual Problem Solving: Slow processing, Decreased initiation, Difficulty sequencing, Requires verbal cues, Requires tactile cues General Comments: Pt needing frequent redirection with tangential speech                   Pertinent Vitals/ Pain       Pain Assessment Pain Assessment: Faces Faces Pain Scale: Hurts little more Pain Location: generalized Pain Descriptors / Indicators: Grimacing, Guarding, Aching Pain Intervention(s): Limited activity within patient's tolerance, Patient requesting pain meds-RN notified, Repositioned         Frequency  Min 2X/week        Progress Toward Goals  OT Goals(current goals can now be found in the care plan section)  Progress towards OT goals: Progressing toward goals  Acute Rehab OT Goals Patient Stated Goal: to go home OT Goal Formulation: With patient/family Time For Goal Achievement: 01/08/22 Potential to Achieve Goals: [place] Discharge plan remains appropriate;Frequency remains appropriate       AM-PAC OT "6 Clicks" Daily Activity     Outcome Measure   Help from another person eating meals?: A Little Help from another person taking care of personal grooming?: A Little Help from another person toileting, which includes using toliet, bedpan, or urinal?: A Lot Help from another person bathing (including washing, rinsing, drying)?: A Lot Help from another person to put on and taking off regular upper body clothing?: A Lot Help from another person to put on and taking off regular lower body clothing?: A Lot 6 Click Score: 14    End of Session Equipment Utilized During Treatment: Oxygen  OT Visit Diagnosis: Unsteadiness on feet (R26.81);Muscle weakness (generalized) (M62.81)   Activity Tolerance Patient limited by pain;Patient limited by fatigue   Patient Left in chair;with call bell/phone within reach;with chair alarm set   Nurse Communication Patient requests  pain meds;Mobility status        Time: 5809-9833 OT Time Calculation (min): 18 min  Charges: OT General Charges $OT Visit: 1 Visit OT Treatments $Self Care/Home Management : 8-22 mins  Darleen Crocker, MS, OTR/L , CBIS ascom [phone]  12/26/21, 1:27 PM

## 2021-12-26 NOTE — TOC Progression Note (Signed)
Transition of Care [facility]) - Progression Note    Patient Details  Name: MADINA GALATI MRN: [id number] Date of Birth: 09-04-1946  Transition of Care [facility]) CM/SW [place], RN Phone Number: 12/26/2021, 3:28 PM  Clinical Narrative:   [name] called me and stated that they changed their mind, they want to go to [facility], I notified Sharyn Lull at [employer]    Expected Discharge Plan: [place]    Expected Discharge Plan and Services Expected Discharge Plan: [place] In-house Referral: Clinical Social Work     Living arrangements for the past 2 months: Single Family Home Expected Discharge Date: 12/25/21                                     Social Determinants of Health (SDOH) Interventions    Readmission Risk Interventions No flowsheet data found.

## 2021-12-26 NOTE — Progress Notes (Signed)
°  Subjective: 3 Days Post-Op Procedure(s) (LRB): ATTEMPTED Left reverse shoulder arthroplasty (Left) OPEN REDUCTION INTERNAL FIXATION (ORIF) HUMERAL SHAFT FRACTURE (Left) Patient reports pain as moderate.   Patient is well, and has had no acute complaints or problems Plan is to go  to [facility] with a transfer to another orthopedic surgeon  after hospital stay. Negative for chest pain and shortness of breath Fever: no Gastrointestinal: Negative for nausea and vomiting  Objective: Vital signs in last 24 hours: Temp:  [98.6 F (37 C)-98.7 F (37.1 C)] 98.6 F (37 C) (02/20 0426) Pulse Rate:  [107-108] 107 (02/20 0426) Resp:  [16-18] 16 (02/20 0426) BP: (115-144)/(53-69) 144/69 (02/20 0426) SpO2:  [90 %-98 %] 97 % (02/20 0426)  Intake/Output from previous day:  Intake/Output Summary (Last 24 hours) at 12/26/2021 0718 Last data filed at 12/25/2021 1016 Gross per 24 hour  Intake 120 ml  Output --  Net 120 ml    Intake/Output this shift: No intake/output data recorded.  Labs: Recent Labs    12/23/21 1317 12/24/21 0446  HGB 11.2* 8.9*   Recent Labs    12/23/21 1317 12/24/21 0446  WBC 11.2* 7.1  RBC 3.64* 2.84*  HCT 34.7* 27.6*  PLT 262 217   Recent Labs    12/24/21 0446  NA 137  K 3.9  CL 107  CO2 26  BUN 15  CREATININE 0.72  GLUCOSE 109*  CALCIUM 7.8*   No results for input(s): LABPT, INR in the last 72 hours.   EXAM General - Patient is Alert and Oriented Extremity - Neurovascular intact Sensation intact distally Dorsiflexion/Plantar flexion intact Dressing/Incision - clean, dry, with the Hemovac removed by the nurse this morning. Motor Function - intact, moving fingers well on exam.  Minimal ambulation with assistance to the chair.  Past Medical History:  Diagnosis Date   Abdominal hernia with obstruction and without gangrene    Anxiety 02/15/2017   denies   Asthma    Bipolar 1 disorder, depressed, full remission ([place]) 02/15/2017   Bipolar affective  disorder (HCC)    COPD (chronic obstructive pulmonary disease) (HCC)    Depression    GERD (gastroesophageal reflux disease) 02/15/2017   Glaucoma    Headache    Hiatal hernia    History of abdominal hernia 05/21/2017   History of compression fracture of spine 02/15/2017   Hyperlipidemia    Hypertension    Incisional hernia    Incisional ventral hernia w obstruction 06/02/2017   Normocytic anemia 05/23/2017   Osteoarthritis of knees, 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   Weight as of this encounter: 44 kg. Advance diet Up with therapy  Discharge planning to home versus rehab.  Limited physical therapy ambulation.  Plan to follow-up with Dr Dorna Bloom, at [facility], for definitive care of the shoulder.  DVT Prophylaxis - Aspirin left shoulder to remain in the shoulder sling at all times.    Reche Dixon, PA-C Orthopaedic Surgery 12/26/2021, 7:18 AM

## 2021-12-26 NOTE — TOC Progression Note (Signed)
Transition of Care [facility]) - Progression Note    Patient Details  Name: Pamela Ball MRN: [id number] Date of Birth: Dec 18, 1945  Transition of Care [facility]) CM/SW [place], RN Phone Number: 12/26/2021, 2:53 PM  Clinical Narrative:   met with the patien and her husband at the bedside, offered beds, they chose [facility], I notified Tammy at [facility], Accepted in the hub, submitted clinical to get ins approval    Expected Discharge Plan: [place]    Expected Discharge Plan and Services Expected Discharge Plan: [place] In-house Referral: Clinical Social Work     Living arrangements for the past 2 months: Single Family Home Expected Discharge Date: 12/25/21                                     Social Determinants of Health (SDOH) Interventions    Readmission Risk Interventions No flowsheet data found.

## 2021-12-26 NOTE — TOC Progression Note (Signed)
Transition of Care [facility]) - Progression Note    Patient Details  Name: TOREE EDLING MRN: [id number] Date of Birth: [birth year]  Transition of Care [facility]) CM/SW [place], RN Phone Number: 12/26/2021, 8:48 AM  Clinical Narrative:   Called [facility] to inquire if they will be able to accept the patient, I spoke with Sharyn Lull, They will review the patient and let me know if they can accept    Expected Discharge Plan: [place]    Expected Discharge Plan and Services Expected Discharge Plan: [place] In-house Referral: Clinical Social Work     Living arrangements for the past 2 months: Single Family Home Expected Discharge Date: 12/25/21                                     Social Determinants of Health (SDOH) Interventions    Readmission Risk Interventions No flowsheet data found.

## 2021-12-26 NOTE — Progress Notes (Signed)
Physical Therapy Treatment Patient Details Name: Pamela Ball MRN: [id number] DOB: Oct 01, 1946 Today's Date: 12/26/2021   History of Present Illness Pt is a 76 y/o F who underwent ORIF of L humeral shaft fx by Dr. Posey Pronto on 12/23/21 as pt had a closed 4-part fx of proximal humerus with malunion. PMH: abdominal hernia, anxiety, asthma, bipolar 1 disorder, COPD, depression, glaucoma, HLD, HTN, B knee OA    PT Comments    Pt ready to get up.  To EOB with heavy mod a x 1.  Steady in sitting.  Stood and transferred to recliner at bedside with min a x 1 but quickly sits in chair.  Assisted pt with light bath and gown change.  Pt remained in recliner with husband and needs met.   Recommendations for follow up therapy are one component of a multi-disciplinary discharge planning process, led by the attending physician.  Recommendations may be updated based on patient status, additional functional criteria and insurance authorization.  Follow Up Recommendations  Skilled nursing-short term rehab (<3 hours/day)     Assistance Recommended at Discharge Frequent or constant Supervision/Assistance  Patient can return home with the following A lot of help with walking and/or transfers;A lot of help with bathing/dressing/bathroom;Direct supervision/assist for medications management;Assist for transportation;Direct supervision/assist for financial management;Assistance with cooking/housework   Equipment Recommendations  None recommended by PT    Recommendations for Other Services       Precautions / Restrictions Precautions Precautions: Fall;Shoulder Shoulder Interventions: Shoulder sling/immobilizer;At all times;Off for dressing/bathing/exercises Restrictions Weight Bearing Restrictions: Yes LUE Weight Bearing: Non weight bearing     Mobility  Bed Mobility Overal bed mobility: Needs Assistance Bed Mobility: Supine to Sit     Supine to sit: Mod assist, HOB elevated           Transfers Overall transfer level: Needs assistance Equipment used: 1 person hand held assist Transfers: Sit to/from Stand, Bed to chair/wheelchair/BSC Sit to Stand: Min assist Stand pivot transfers: Min assist              Ambulation/Gait               General Gait Details: no true gait but is able to take small steps to chair.   Stairs             Wheelchair Mobility    Modified Rankin (Stroke Patients Only)       Balance Overall balance assessment: Needs assistance Sitting-balance support: Feet supported Sitting balance-Leahy Scale: Good     Standing balance support: Single extremity supported Standing balance-Leahy Scale: Poor                              Cognition Arousal/Alertness: Awake/alert Behavior During Therapy: Flat affect Overall Cognitive Status: Impaired/Different from baseline                                          Exercises Other Exercises Other Exercises: bathing in unsupported sitting    General Comments        Pertinent Vitals/Pain Pain Assessment Pain Assessment: Faces Faces Pain Scale: Hurts little more Pain Location: generalized Pain Descriptors / Indicators: Grimacing, Guarding, Aching Pain Intervention(s): Limited activity within patient's tolerance, Patient requesting pain meds-RN notified, Repositioned    Home Living  Prior Function            PT Goals (current goals can now be found in the care plan section) Progress towards PT goals: Progressing toward goals    Frequency    7X/week      PT Plan Current plan remains appropriate    Co-evaluation              AM-PAC PT "6 Clicks" Mobility   Outcome Measure  Help needed turning from your back to your side while in a flat bed without using bedrails?: A Lot Help needed moving from lying on your back to sitting on the side of a flat bed without using bedrails?: A Lot Help  needed moving to and from a bed to a chair (including a wheelchair)?: A Lot Help needed standing up from a chair using your arms (e.g., wheelchair or bedside chair)?: A Lot Help needed to walk in hospital room?: Total Help needed climbing 3-5 steps with a railing? : Total 6 Click Score: 10    End of Session Equipment Utilized During Treatment: Gait belt Activity Tolerance: Patient tolerated treatment well Patient left: in chair;with chair alarm set;with call bell/phone within reach;with family/visitor present Nurse Communication: Mobility status PT Visit Diagnosis: Difficulty in walking, not elsewhere classified (R26.2);History of falling (Z91.81);Unsteadiness on feet (R26.81);Muscle weakness (generalized) (M62.81)     Time: 1583-0940 PT Time Calculation (min) (ACUTE ONLY): 24 min  Charges:  $Therapeutic Activity: 23-37 mins                    Chesley Noon, PTA 12/26/21, 1:24 PM

## 2021-12-27 DIAGNOSIS — G301 Alzheimer's disease with late onset: Secondary | ICD-10-CM | POA: Diagnosis not present

## 2021-12-27 DIAGNOSIS — M9732XA Periprosthetic fracture around internal prosthetic left shoulder joint, initial encounter: Secondary | ICD-10-CM | POA: Diagnosis not present

## 2021-12-27 DIAGNOSIS — I252 Old myocardial infarction: Secondary | ICD-10-CM | POA: Diagnosis not present

## 2021-12-27 DIAGNOSIS — M800AXK Age-related osteoporosis with current pathological fracture, other site, subsequent encounter for fracture with nonunion: Secondary | ICD-10-CM | POA: Diagnosis not present

## 2021-12-27 DIAGNOSIS — R296 Repeated falls: Secondary | ICD-10-CM | POA: Diagnosis not present

## 2021-12-27 DIAGNOSIS — Z741 Need for assistance with personal care: Secondary | ICD-10-CM | POA: Diagnosis not present

## 2021-12-27 DIAGNOSIS — J45909 Unspecified asthma, uncomplicated: Secondary | ICD-10-CM | POA: Diagnosis not present

## 2021-12-27 DIAGNOSIS — D62 Acute posthemorrhagic anemia: Secondary | ICD-10-CM | POA: Diagnosis not present

## 2021-12-27 DIAGNOSIS — K219 Gastro-esophageal reflux disease without esophagitis: Secondary | ICD-10-CM | POA: Diagnosis not present

## 2021-12-27 DIAGNOSIS — M80022G Age-related osteoporosis with current pathological fracture, left humerus, subsequent encounter for fracture with delayed healing: Secondary | ICD-10-CM | POA: Diagnosis not present

## 2021-12-27 DIAGNOSIS — F32A Depression, unspecified: Secondary | ICD-10-CM | POA: Diagnosis not present

## 2021-12-27 DIAGNOSIS — Z885 Allergy status to narcotic agent status: Secondary | ICD-10-CM | POA: Diagnosis not present

## 2021-12-27 DIAGNOSIS — R6889 Other general symptoms and signs: Secondary | ICD-10-CM | POA: Diagnosis not present

## 2021-12-27 DIAGNOSIS — R778 Other specified abnormalities of plasma proteins: Secondary | ICD-10-CM | POA: Diagnosis not present

## 2021-12-27 DIAGNOSIS — Z7983 Long term (current) use of bisphosphonates: Secondary | ICD-10-CM | POA: Diagnosis not present

## 2021-12-27 DIAGNOSIS — M80019A Age-related osteoporosis with current pathological fracture, unspecified shoulder, initial encounter for fracture: Secondary | ICD-10-CM | POA: Diagnosis not present

## 2021-12-27 DIAGNOSIS — Z01818 Encounter for other preprocedural examination: Secondary | ICD-10-CM | POA: Diagnosis not present

## 2021-12-27 DIAGNOSIS — S42292P Other displaced fracture of upper end of left humerus, subsequent encounter for fracture with malunion: Secondary | ICD-10-CM | POA: Diagnosis not present

## 2021-12-27 DIAGNOSIS — R600 Localized edema: Secondary | ICD-10-CM | POA: Diagnosis not present

## 2021-12-27 DIAGNOSIS — Z79899 Other long term (current) drug therapy: Secondary | ICD-10-CM | POA: Diagnosis not present

## 2021-12-27 DIAGNOSIS — E785 Hyperlipidemia, unspecified: Secondary | ICD-10-CM | POA: Diagnosis not present

## 2021-12-27 DIAGNOSIS — K2100 Gastro-esophageal reflux disease with esophagitis, without bleeding: Secondary | ICD-10-CM | POA: Diagnosis not present

## 2021-12-27 DIAGNOSIS — Z743 Need for continuous supervision: Secondary | ICD-10-CM | POA: Diagnosis not present

## 2021-12-27 DIAGNOSIS — M6281 Muscle weakness (generalized): Secondary | ICD-10-CM | POA: Diagnosis not present

## 2021-12-27 DIAGNOSIS — Z91048 Other nonmedicinal substance allergy status: Secondary | ICD-10-CM | POA: Diagnosis not present

## 2021-12-27 DIAGNOSIS — W010XXD Fall on same level from slipping, tripping and stumbling without subsequent striking against object, subsequent encounter: Secondary | ICD-10-CM | POA: Diagnosis not present

## 2021-12-27 DIAGNOSIS — Z20822 Contact with and (suspected) exposure to covid-19: Secondary | ICD-10-CM | POA: Diagnosis not present

## 2021-12-27 DIAGNOSIS — Z736 Limitation of activities due to disability: Secondary | ICD-10-CM | POA: Diagnosis not present

## 2021-12-27 DIAGNOSIS — M25512 Pain in left shoulder: Secondary | ICD-10-CM | POA: Diagnosis not present

## 2021-12-27 DIAGNOSIS — Z7401 Bed confinement status: Secondary | ICD-10-CM | POA: Diagnosis not present

## 2021-12-27 DIAGNOSIS — M810 Age-related osteoporosis without current pathological fracture: Secondary | ICD-10-CM | POA: Diagnosis not present

## 2021-12-27 DIAGNOSIS — M6259 Muscle wasting and atrophy, not elsewhere classified, multiple sites: Secondary | ICD-10-CM | POA: Diagnosis not present

## 2021-12-27 DIAGNOSIS — S42252P Displaced fracture of greater tuberosity of left humerus, subsequent encounter for fracture with malunion: Secondary | ICD-10-CM | POA: Diagnosis not present

## 2021-12-27 DIAGNOSIS — G8918 Other acute postprocedural pain: Secondary | ICD-10-CM | POA: Diagnosis not present

## 2021-12-27 DIAGNOSIS — S42352A Displaced comminuted fracture of shaft of humerus, left arm, initial encounter for closed fracture: Secondary | ICD-10-CM | POA: Diagnosis not present

## 2021-12-27 DIAGNOSIS — Z7982 Long term (current) use of aspirin: Secondary | ICD-10-CM | POA: Diagnosis not present

## 2021-12-27 DIAGNOSIS — T8484XA Pain due to internal orthopedic prosthetic devices, implants and grafts, initial encounter: Secondary | ICD-10-CM | POA: Diagnosis not present

## 2021-12-27 DIAGNOSIS — Z4789 Encounter for other orthopedic aftercare: Secondary | ICD-10-CM | POA: Diagnosis not present

## 2021-12-27 DIAGNOSIS — D751 Secondary polycythemia: Secondary | ICD-10-CM | POA: Diagnosis not present

## 2021-12-27 DIAGNOSIS — I10 Essential (primary) hypertension: Secondary | ICD-10-CM | POA: Diagnosis not present

## 2021-12-27 DIAGNOSIS — S42293P Other displaced fracture of upper end of unspecified humerus, subsequent encounter for fracture with malunion: Secondary | ICD-10-CM | POA: Diagnosis not present

## 2021-12-27 DIAGNOSIS — F02B0 Dementia in other diseases classified elsewhere, moderate, without behavioral disturbance, psychotic disturbance, mood disturbance, and anxiety: Secondary | ICD-10-CM | POA: Diagnosis not present

## 2021-12-27 DIAGNOSIS — R2681 Unsteadiness on feet: Secondary | ICD-10-CM | POA: Diagnosis not present

## 2021-12-27 DIAGNOSIS — J449 Chronic obstructive pulmonary disease, unspecified: Secondary | ICD-10-CM | POA: Diagnosis not present

## 2021-12-27 DIAGNOSIS — Z96612 Presence of left artificial shoulder joint: Secondary | ICD-10-CM | POA: Diagnosis not present

## 2021-12-27 DIAGNOSIS — S42309S Unspecified fracture of shaft of humerus, unspecified arm, sequela: Secondary | ICD-10-CM | POA: Diagnosis not present

## 2021-12-27 DIAGNOSIS — D472 Monoclonal gammopathy: Secondary | ICD-10-CM | POA: Diagnosis not present

## 2021-12-27 DIAGNOSIS — G8911 Acute pain due to trauma: Secondary | ICD-10-CM | POA: Diagnosis not present

## 2021-12-27 DIAGNOSIS — E569 Vitamin deficiency, unspecified: Secondary | ICD-10-CM | POA: Diagnosis not present

## 2021-12-27 DIAGNOSIS — Z8673 Personal history of transient ischemic attack (TIA), and cerebral infarction without residual deficits: Secondary | ICD-10-CM | POA: Diagnosis not present

## 2021-12-27 DIAGNOSIS — I2510 Atherosclerotic heart disease of native coronary artery without angina pectoris: Secondary | ICD-10-CM | POA: Diagnosis not present

## 2021-12-27 DIAGNOSIS — M8000XK Age-related osteoporosis with current pathological fracture, unspecified site, subsequent encounter for fracture with nonunion: Secondary | ICD-10-CM | POA: Diagnosis not present

## 2021-12-27 DIAGNOSIS — F01518 Vascular dementia, unspecified severity, with other behavioral disturbance: Secondary | ICD-10-CM | POA: Diagnosis not present

## 2021-12-27 DIAGNOSIS — H547 Unspecified visual loss: Secondary | ICD-10-CM | POA: Diagnosis not present

## 2021-12-27 DIAGNOSIS — S42302A Unspecified fracture of shaft of humerus, left arm, initial encounter for closed fracture: Secondary | ICD-10-CM | POA: Diagnosis not present

## 2021-12-27 LAB — RESP PANEL BY RT-PCR (FLU A&B, COVID) ARPGX2
Influenza A by PCR: NEGATIVE
Influenza B by PCR: NEGATIVE
SARS Coronavirus 2 by RT PCR: NEGATIVE

## 2021-12-27 NOTE — Plan of Care (Signed)

## 2021-12-27 NOTE — Progress Notes (Signed)
°  Subjective: 4 Days Post-Op Procedure(s) (LRB): ATTEMPTED Left reverse shoulder arthroplasty (Left) OPEN REDUCTION INTERNAL FIXATION (ORIF) HUMERAL SHAFT FRACTURE (Left) Patient reports pain as moderate.   Patient is well, and has had no acute complaints or problems Plan is to go  to [facility] with a transfer to another orthopedic surgeon  after hospital stay.  Planning on going to rehab today. Negative for chest pain and shortness of breath Fever: no Gastrointestinal: Negative for nausea and vomiting  Objective: Vital signs in last 24 hours: Temp:  [97.9 F (36.6 C)-98.6 F (37 C)] 97.9 F (36.6 C) (02/21 0438) Pulse Rate:  [92-108] 108 (02/21 0438) Resp:  [15-18] 18 (02/21 0438) BP: (110-150)/(49-68) 146/68 (02/21 0438) SpO2:  [94 %-100 %] 100 % (02/21 0438)  Intake/Output from previous day:  Intake/Output Summary (Last 24 hours) at 12/27/2021 0708 Last data filed at 12/27/2021 0507 Gross per 24 hour  Intake 600 ml  Output 500 ml  Net 100 ml    Intake/Output this shift: No intake/output data recorded.  Labs: No results for input(s): HGB in the last 72 hours.  No results for input(s): WBC, RBC, HCT, PLT in the last 72 hours.  No results for input(s): NA, K, CL, CO2, BUN, CREATININE, GLUCOSE, CALCIUM in the last 72 hours.  No results for input(s): LABPT, INR in the last 72 hours.   EXAM General - Patient is Alert and Oriented Extremity - Neurovascular intact Sensation intact distally Dorsiflexion/Plantar flexion intact Dressing/Incision - clean, dry, with the Hemovac removed by the nurse this morning. Motor Function - intact, moving fingers well on exam.  Minimal ambulation with assistance to the chair.  Past Medical History:  Diagnosis Date   Abdominal hernia with obstruction and without gangrene    Anxiety 02/15/2017   denies   Asthma    Bipolar 1 disorder, depressed, full remission ([place]) 02/15/2017   Bipolar affective disorder (HCC)    COPD (chronic obstructive  pulmonary disease) (HCC)    Depression    GERD (gastroesophageal reflux disease) 02/15/2017   Glaucoma    Headache    Hiatal hernia    History of abdominal hernia 05/21/2017   History of compression fracture of spine 02/15/2017   Hyperlipidemia    Hypertension    Incisional hernia    Incisional ventral hernia w obstruction 06/02/2017   Normocytic anemia 05/23/2017   Osteoarthritis of knees, 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   Presbycusis of both ears 02/15/2017    Assessment/Plan: 4 Days Post-Op Procedure(s) (LRB): ATTEMPTED Left reverse shoulder arthroplasty (Left) OPEN REDUCTION INTERNAL FIXATION (ORIF) HUMERAL SHAFT FRACTURE (Left) Principal Problem:   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   Weight as of this encounter: 44 kg. Advance diet Up with therapy  Discharge planning to rehab today.  Limited physical therapy ambulation.  Plan to follow-up with Dr Dorna Bloom, at [facility], for definitive care of the shoulder.  DVT Prophylaxis - Aspirin left shoulder to remain in the shoulder sling at all times.    Reche Dixon, PA-C Orthopaedic Surgery 12/27/2021, 7:08 AM

## 2021-12-27 NOTE — TOC Progression Note (Signed)
Transition of Care [facility]) - Progression Note    Patient Details  Name: Pamela Ball MRN: [id number] Date of Birth: Nov 25, 1945  Transition of Care [facility]) CM/SW [place], RN Phone Number: 12/27/2021, 8:50 AM  Clinical Narrative:   Received a call from [facility] this morning stating that they are not able to accept the patient, the patient accepted the bed from [facility], Ins approved Auth number [id number], anticipate DC today after has a BM    Expected Discharge Plan: [place]    Expected Discharge Plan and Services Expected Discharge Plan: [place] In-house Referral: Clinical Social Work     Living arrangements for the past 2 months: Single Family Home Expected Discharge Date: 12/27/21                                     Social Determinants of Health (SDOH) Interventions    Readmission Risk Interventions No flowsheet data found.

## 2021-12-27 NOTE — TOC Progression Note (Signed)
Transition of Care [facility]) - Progression Note    Patient Details  Name: Pamela Ball MRN: [id number] Date of Birth: 1945-12-28  Transition of Care [facility]) CM/SW [place], RN Phone Number: 12/27/2021, 12:17 PM  Clinical Narrative:   patient to go to [facility] today, Ins approved, [name] aware, EMS called to transport     Expected Discharge Plan: [place]    Expected Discharge Plan and Services Expected Discharge Plan: [place] In-house Referral: Clinical Social Work     Living arrangements for the past 2 months: Single Family Home Expected Discharge Date: 12/27/21                                     Social Determinants of Health (SDOH) Interventions    Readmission Risk Interventions No flowsheet data found.

## 2021-12-27 NOTE — Progress Notes (Signed)
Patient tested covid negative and has had BM. Discharge instructions reviewed with patient and husband. I have called [facility] and given report. EMS transport has been notified.

## 2021-12-27 NOTE — Care Management Important Message (Signed)
Important Message  Patient Details  Name: Pamela Ball MRN: [id number] Date of Birth: Feb 09, 1946   Medicare Important Message Given:  Yes     [name] A [name] 12/27/2021, 10:38 AM

## 2021-12-28 ENCOUNTER — Telehealth: Payer: Self-pay | Admitting: Family Medicine

## 2021-12-28 DIAGNOSIS — S42302A Unspecified fracture of shaft of humerus, left arm, initial encounter for closed fracture: Secondary | ICD-10-CM | POA: Diagnosis not present

## 2021-12-28 DIAGNOSIS — M80019A Age-related osteoporosis with current pathological fracture, unspecified shoulder, initial encounter for fracture: Secondary | ICD-10-CM | POA: Diagnosis not present

## 2021-12-28 NOTE — Telephone Encounter (Signed)
Pamela Ball with [facility] states pt is dc'd from them. Pt is at [facility]  Pt had reverse total shoulder replacement and had some complications.  Pt was admitted to rehab, so all home health orders stopped.  Cb [phone]

## 2021-12-29 DIAGNOSIS — G8911 Acute pain due to trauma: Secondary | ICD-10-CM | POA: Diagnosis not present

## 2021-12-29 DIAGNOSIS — J449 Chronic obstructive pulmonary disease, unspecified: Secondary | ICD-10-CM | POA: Diagnosis not present

## 2021-12-29 DIAGNOSIS — M6281 Muscle weakness (generalized): Secondary | ICD-10-CM | POA: Diagnosis not present

## 2021-12-29 DIAGNOSIS — M80022G Age-related osteoporosis with current pathological fracture, left humerus, subsequent encounter for fracture with delayed healing: Secondary | ICD-10-CM | POA: Diagnosis not present

## 2022-01-02 DIAGNOSIS — G8911 Acute pain due to trauma: Secondary | ICD-10-CM | POA: Diagnosis not present

## 2022-01-02 DIAGNOSIS — M80022G Age-related osteoporosis with current pathological fracture, left humerus, subsequent encounter for fracture with delayed healing: Secondary | ICD-10-CM | POA: Diagnosis not present

## 2022-01-04 DIAGNOSIS — W010XXD Fall on same level from slipping, tripping and stumbling without subsequent striking against object, subsequent encounter: Secondary | ICD-10-CM | POA: Diagnosis not present

## 2022-01-04 DIAGNOSIS — S42292P Other displaced fracture of upper end of left humerus, subsequent encounter for fracture with malunion: Secondary | ICD-10-CM | POA: Insufficient documentation

## 2022-01-04 DIAGNOSIS — S42352A Displaced comminuted fracture of shaft of humerus, left arm, initial encounter for closed fracture: Secondary | ICD-10-CM | POA: Diagnosis not present

## 2022-01-05 DIAGNOSIS — R296 Repeated falls: Secondary | ICD-10-CM

## 2022-01-05 DIAGNOSIS — M810 Age-related osteoporosis without current pathological fracture: Secondary | ICD-10-CM | POA: Diagnosis not present

## 2022-01-05 DIAGNOSIS — J45909 Unspecified asthma, uncomplicated: Secondary | ICD-10-CM | POA: Diagnosis not present

## 2022-01-05 DIAGNOSIS — F32A Depression, unspecified: Secondary | ICD-10-CM | POA: Diagnosis not present

## 2022-01-05 DIAGNOSIS — I252 Old myocardial infarction: Secondary | ICD-10-CM | POA: Diagnosis not present

## 2022-01-05 DIAGNOSIS — E569 Vitamin deficiency, unspecified: Secondary | ICD-10-CM | POA: Diagnosis not present

## 2022-01-05 DIAGNOSIS — Z79899 Other long term (current) drug therapy: Secondary | ICD-10-CM | POA: Diagnosis not present

## 2022-01-05 DIAGNOSIS — R778 Other specified abnormalities of plasma proteins: Secondary | ICD-10-CM | POA: Diagnosis not present

## 2022-01-05 DIAGNOSIS — M800AXK Age-related osteoporosis with current pathological fracture, other site, subsequent encounter for fracture with nonunion: Secondary | ICD-10-CM | POA: Diagnosis not present

## 2022-01-05 DIAGNOSIS — M80022G Age-related osteoporosis with current pathological fracture, left humerus, subsequent encounter for fracture with delayed healing: Secondary | ICD-10-CM | POA: Diagnosis not present

## 2022-01-05 DIAGNOSIS — Z7983 Long term (current) use of bisphosphonates: Secondary | ICD-10-CM | POA: Diagnosis not present

## 2022-01-05 DIAGNOSIS — Z7982 Long term (current) use of aspirin: Secondary | ICD-10-CM | POA: Diagnosis not present

## 2022-01-05 DIAGNOSIS — R600 Localized edema: Secondary | ICD-10-CM | POA: Diagnosis not present

## 2022-01-05 DIAGNOSIS — S42293P Other displaced fracture of upper end of unspecified humerus, subsequent encounter for fracture with malunion: Secondary | ICD-10-CM | POA: Diagnosis not present

## 2022-01-05 DIAGNOSIS — Z741 Need for assistance with personal care: Secondary | ICD-10-CM | POA: Diagnosis not present

## 2022-01-05 DIAGNOSIS — R069 Unspecified abnormalities of breathing: Secondary | ICD-10-CM | POA: Diagnosis not present

## 2022-01-05 DIAGNOSIS — Z885 Allergy status to narcotic agent status: Secondary | ICD-10-CM | POA: Diagnosis not present

## 2022-01-05 DIAGNOSIS — J449 Chronic obstructive pulmonary disease, unspecified: Secondary | ICD-10-CM | POA: Diagnosis not present

## 2022-01-05 DIAGNOSIS — D62 Acute posthemorrhagic anemia: Secondary | ICD-10-CM | POA: Diagnosis not present

## 2022-01-05 DIAGNOSIS — Z01818 Encounter for other preprocedural examination: Secondary | ICD-10-CM | POA: Diagnosis not present

## 2022-01-05 DIAGNOSIS — Z8673 Personal history of transient ischemic attack (TIA), and cerebral infarction without residual deficits: Secondary | ICD-10-CM | POA: Insufficient documentation

## 2022-01-05 DIAGNOSIS — T8484XA Pain due to internal orthopedic prosthetic devices, implants and grafts, initial encounter: Secondary | ICD-10-CM | POA: Diagnosis not present

## 2022-01-05 DIAGNOSIS — Z96612 Presence of left artificial shoulder joint: Secondary | ICD-10-CM | POA: Diagnosis not present

## 2022-01-05 DIAGNOSIS — G8918 Other acute postprocedural pain: Secondary | ICD-10-CM | POA: Diagnosis not present

## 2022-01-05 DIAGNOSIS — I10 Essential (primary) hypertension: Secondary | ICD-10-CM | POA: Diagnosis not present

## 2022-01-05 DIAGNOSIS — S42292P Other displaced fracture of upper end of left humerus, subsequent encounter for fracture with malunion: Secondary | ICD-10-CM | POA: Diagnosis not present

## 2022-01-05 DIAGNOSIS — I2510 Atherosclerotic heart disease of native coronary artery without angina pectoris: Secondary | ICD-10-CM | POA: Diagnosis not present

## 2022-01-05 DIAGNOSIS — Z20822 Contact with and (suspected) exposure to covid-19: Secondary | ICD-10-CM | POA: Diagnosis not present

## 2022-01-05 DIAGNOSIS — F319 Bipolar disorder, unspecified: Secondary | ICD-10-CM | POA: Insufficient documentation

## 2022-01-05 DIAGNOSIS — Z736 Limitation of activities due to disability: Secondary | ICD-10-CM | POA: Diagnosis not present

## 2022-01-05 DIAGNOSIS — M9732XA Periprosthetic fracture around internal prosthetic left shoulder joint, initial encounter: Secondary | ICD-10-CM | POA: Diagnosis not present

## 2022-01-05 DIAGNOSIS — E785 Hyperlipidemia, unspecified: Secondary | ICD-10-CM | POA: Diagnosis not present

## 2022-01-05 DIAGNOSIS — D751 Secondary polycythemia: Secondary | ICD-10-CM | POA: Insufficient documentation

## 2022-01-05 DIAGNOSIS — Z743 Need for continuous supervision: Secondary | ICD-10-CM | POA: Diagnosis not present

## 2022-01-05 DIAGNOSIS — D472 Monoclonal gammopathy: Secondary | ICD-10-CM | POA: Diagnosis not present

## 2022-01-05 DIAGNOSIS — F01518 Vascular dementia, unspecified severity, with other behavioral disturbance: Secondary | ICD-10-CM | POA: Diagnosis not present

## 2022-01-05 DIAGNOSIS — M8000XK Age-related osteoporosis with current pathological fracture, unspecified site, subsequent encounter for fracture with nonunion: Secondary | ICD-10-CM | POA: Diagnosis not present

## 2022-01-05 DIAGNOSIS — K2100 Gastro-esophageal reflux disease with esophagitis, without bleeding: Secondary | ICD-10-CM | POA: Diagnosis not present

## 2022-01-05 DIAGNOSIS — K219 Gastro-esophageal reflux disease without esophagitis: Secondary | ICD-10-CM | POA: Diagnosis not present

## 2022-01-05 DIAGNOSIS — S42252P Displaced fracture of greater tuberosity of left humerus, subsequent encounter for fracture with malunion: Secondary | ICD-10-CM | POA: Diagnosis not present

## 2022-01-05 DIAGNOSIS — H547 Unspecified visual loss: Secondary | ICD-10-CM | POA: Diagnosis not present

## 2022-01-05 DIAGNOSIS — Z91048 Other nonmedicinal substance allergy status: Secondary | ICD-10-CM | POA: Diagnosis not present

## 2022-01-05 DIAGNOSIS — Z4789 Encounter for other orthopedic aftercare: Secondary | ICD-10-CM | POA: Diagnosis not present

## 2022-01-05 DIAGNOSIS — M6259 Muscle wasting and atrophy, not elsewhere classified, multiple sites: Secondary | ICD-10-CM | POA: Diagnosis not present

## 2022-01-05 DIAGNOSIS — R2681 Unsteadiness on feet: Secondary | ICD-10-CM | POA: Diagnosis not present

## 2022-01-05 DIAGNOSIS — M25512 Pain in left shoulder: Secondary | ICD-10-CM | POA: Diagnosis not present

## 2022-01-07 DIAGNOSIS — K2100 Gastro-esophageal reflux disease with esophagitis, without bleeding: Secondary | ICD-10-CM | POA: Diagnosis not present

## 2022-01-07 DIAGNOSIS — E785 Hyperlipidemia, unspecified: Secondary | ICD-10-CM | POA: Diagnosis not present

## 2022-01-07 DIAGNOSIS — Z743 Need for continuous supervision: Secondary | ICD-10-CM | POA: Diagnosis not present

## 2022-01-07 DIAGNOSIS — R069 Unspecified abnormalities of breathing: Secondary | ICD-10-CM | POA: Diagnosis not present

## 2022-01-07 DIAGNOSIS — G8911 Acute pain due to trauma: Secondary | ICD-10-CM | POA: Diagnosis not present

## 2022-01-07 DIAGNOSIS — I2510 Atherosclerotic heart disease of native coronary artery without angina pectoris: Secondary | ICD-10-CM | POA: Diagnosis not present

## 2022-01-07 DIAGNOSIS — Z741 Need for assistance with personal care: Secondary | ICD-10-CM | POA: Diagnosis not present

## 2022-01-07 DIAGNOSIS — S42293P Other displaced fracture of upper end of unspecified humerus, subsequent encounter for fracture with malunion: Secondary | ICD-10-CM | POA: Diagnosis not present

## 2022-01-07 DIAGNOSIS — J45909 Unspecified asthma, uncomplicated: Secondary | ICD-10-CM | POA: Diagnosis not present

## 2022-01-07 DIAGNOSIS — Z736 Limitation of activities due to disability: Secondary | ICD-10-CM | POA: Diagnosis not present

## 2022-01-07 DIAGNOSIS — D649 Anemia, unspecified: Secondary | ICD-10-CM | POA: Diagnosis not present

## 2022-01-07 DIAGNOSIS — F32A Depression, unspecified: Secondary | ICD-10-CM | POA: Diagnosis not present

## 2022-01-07 DIAGNOSIS — R2232 Localized swelling, mass and lump, left upper limb: Secondary | ICD-10-CM | POA: Diagnosis not present

## 2022-01-07 DIAGNOSIS — M6281 Muscle weakness (generalized): Secondary | ICD-10-CM | POA: Diagnosis not present

## 2022-01-07 DIAGNOSIS — F01518 Vascular dementia, unspecified severity, with other behavioral disturbance: Secondary | ICD-10-CM | POA: Diagnosis not present

## 2022-01-07 DIAGNOSIS — E559 Vitamin D deficiency, unspecified: Secondary | ICD-10-CM | POA: Diagnosis not present

## 2022-01-07 DIAGNOSIS — R2681 Unsteadiness on feet: Secondary | ICD-10-CM | POA: Diagnosis not present

## 2022-01-07 DIAGNOSIS — I10 Essential (primary) hypertension: Secondary | ICD-10-CM | POA: Diagnosis not present

## 2022-01-07 DIAGNOSIS — L03114 Cellulitis of left upper limb: Secondary | ICD-10-CM | POA: Diagnosis not present

## 2022-01-07 DIAGNOSIS — M810 Age-related osteoporosis without current pathological fracture: Secondary | ICD-10-CM | POA: Diagnosis not present

## 2022-01-07 DIAGNOSIS — Z4789 Encounter for other orthopedic aftercare: Secondary | ICD-10-CM | POA: Diagnosis not present

## 2022-01-07 DIAGNOSIS — M6259 Muscle wasting and atrophy, not elsewhere classified, multiple sites: Secondary | ICD-10-CM | POA: Diagnosis not present

## 2022-01-07 DIAGNOSIS — J449 Chronic obstructive pulmonary disease, unspecified: Secondary | ICD-10-CM | POA: Diagnosis not present

## 2022-01-07 DIAGNOSIS — M79622 Pain in left upper arm: Secondary | ICD-10-CM | POA: Diagnosis not present

## 2022-01-07 DIAGNOSIS — M80022G Age-related osteoporosis with current pathological fracture, left humerus, subsequent encounter for fracture with delayed healing: Secondary | ICD-10-CM | POA: Diagnosis not present

## 2022-01-07 DIAGNOSIS — E569 Vitamin deficiency, unspecified: Secondary | ICD-10-CM | POA: Diagnosis not present

## 2022-01-09 DIAGNOSIS — G8911 Acute pain due to trauma: Secondary | ICD-10-CM | POA: Diagnosis not present

## 2022-01-09 DIAGNOSIS — D649 Anemia, unspecified: Secondary | ICD-10-CM | POA: Diagnosis not present

## 2022-01-09 DIAGNOSIS — M80022G Age-related osteoporosis with current pathological fracture, left humerus, subsequent encounter for fracture with delayed healing: Secondary | ICD-10-CM | POA: Diagnosis not present

## 2022-01-10 ENCOUNTER — Inpatient Hospital Stay: Payer: Medicare Other | Admitting: Family Medicine

## 2022-01-12 DIAGNOSIS — D649 Anemia, unspecified: Secondary | ICD-10-CM | POA: Diagnosis not present

## 2022-01-12 DIAGNOSIS — M80022G Age-related osteoporosis with current pathological fracture, left humerus, subsequent encounter for fracture with delayed healing: Secondary | ICD-10-CM | POA: Diagnosis not present

## 2022-01-12 DIAGNOSIS — G8911 Acute pain due to trauma: Secondary | ICD-10-CM | POA: Diagnosis not present

## 2022-01-12 DIAGNOSIS — M6281 Muscle weakness (generalized): Secondary | ICD-10-CM | POA: Diagnosis not present

## 2022-01-16 ENCOUNTER — Other Ambulatory Visit: Payer: Self-pay

## 2022-01-16 ENCOUNTER — Encounter: Payer: Self-pay | Admitting: Family Medicine

## 2022-01-16 ENCOUNTER — Other Ambulatory Visit (INDEPENDENT_AMBULATORY_CARE_PROVIDER_SITE_OTHER): Payer: Medicare Other | Admitting: Family Medicine

## 2022-01-16 ENCOUNTER — Ambulatory Visit (INDEPENDENT_AMBULATORY_CARE_PROVIDER_SITE_OTHER): Payer: Medicare Other | Admitting: Family Medicine

## 2022-01-16 VITALS — BP 101/48 | HR 99 | Ht <= 58 in | Wt 97.0 lb

## 2022-01-16 DIAGNOSIS — M80052D Age-related osteoporosis with current pathological fracture, left femur, subsequent encounter for fracture with routine healing: Secondary | ICD-10-CM

## 2022-01-16 DIAGNOSIS — S42475D Nondisplaced transcondylar fracture of left humerus, subsequent encounter for fracture with routine healing: Secondary | ICD-10-CM | POA: Diagnosis not present

## 2022-01-16 DIAGNOSIS — M800AXD Age-related osteoporosis with current pathological fracture, other site, subsequent encounter for fracture with routine healing: Secondary | ICD-10-CM | POA: Diagnosis not present

## 2022-01-16 DIAGNOSIS — M79602 Pain in left arm: Secondary | ICD-10-CM | POA: Diagnosis not present

## 2022-01-16 DIAGNOSIS — H3530 Unspecified macular degeneration: Secondary | ICD-10-CM

## 2022-01-16 DIAGNOSIS — I10 Essential (primary) hypertension: Secondary | ICD-10-CM

## 2022-01-16 DIAGNOSIS — H547 Unspecified visual loss: Secondary | ICD-10-CM

## 2022-01-16 DIAGNOSIS — S40022S Contusion of left upper arm, sequela: Secondary | ICD-10-CM

## 2022-01-16 DIAGNOSIS — F3176 Bipolar disorder, in full remission, most recent episode depressed: Secondary | ICD-10-CM

## 2022-01-16 DIAGNOSIS — M80022D Age-related osteoporosis with current pathological fracture, left humerus, subsequent encounter for fracture with routine healing: Secondary | ICD-10-CM | POA: Diagnosis not present

## 2022-01-16 MED ORDER — MUPIROCIN 2 % EX OINT: 1.0000 "application " | TOPICAL_OINTMENT | Freq: Two times a day (BID) | CUTANEOUS | 0 refills | Status: DC

## 2022-01-16 NOTE — Progress Notes (Signed)
Received orders from [employer]. ? ?Start of care 08/11/2021. ?Orders are from 12/09/2021 to 02/06/2022 ?All orders have been reviewed, signed and faxed. ? ?[name], DO ?[facility] ?[facility] ?01/16/2022, 4:27 PM ? ?

## 2022-01-16 NOTE — Patient Instructions (Addendum)
Thank you for coming to the office today. ? ?We will fax [facility] - Dr Dorna Bloom the records from today with picture. ? ?I am worried about a hematoma forming at incision site. ? ?Try to call them and get seen sooner this week if at all possible. ? ?We can do the same ? ?Klifto, Merwyn Katos, MD   ?[address]   ?[place]   ?[place], [place] [address]   ?[phone] (Work)   ?[phone] (Fax)   ? ? ?Please schedule a Follow-up Appointment to: Return if symptoms worsen or fail to improve. ? ?If you have any other questions or concerns, please feel free to call the office or send a message through [place]. You may also schedule an earlier appointment if necessary. ? ?Additionally, you may be receiving a survey about your experience at our office within a few days to 1 week by e-mail or mail. We value your feedback. ? ?[name], DO ?[place] ?

## 2022-01-16 NOTE — Progress Notes (Signed)
? ?Subjective:  ? ? Patient ID: Pamela Ball, female    DOB: 06-22-1946, 76 y.o.   MRN: [id number] ? ?Pamela Ball is a 76 y.o. female presenting on 01/16/2022 for Hospitalization Follow-up ? ? ?HPI ? ?Left Humerus Fracture ?hematoma ? ?L Shoulder replacement, 12/23/21 Dr Posey Pronto - still has staples, ready to remove.  ? ?She was discharged to Rehab, then fall injury and fracture to Left humerus, then discharged to Rehab again. ? ?Now has ortho apt on Friday this week in [place] with Dr Dorna Bloom for Left Humerus ? ?Admits some pain in shoulder still and mainly has increased swelling in area of her stitches middle lower aspect. Using topical neosporin ? ?Denies fever chills sweats, bleeding drainage out of stitches ? ? ?Depression screen [facility] 2/9 01/16/2022 12/13/2021 02/08/2021  ?Decreased Interest 0 2 0  ?Down, Depressed, Hopeless 0 1 0  ?PHQ - 2 Score 0 3 0  ?Altered sleeping 0 1 -  ?Tired, decreased energy 0 1 -  ?Change in appetite 0 1 -  ?Feeling bad or failure about yourself  0 0 -  ?Trouble concentrating 0 0 -  ?Moving slowly or fidgety/restless 0 0 -  ?Suicidal thoughts 0 0 -  ?PHQ-9 Score 0 6 -  ?Difficult doing work/chores Not difficult at all Not difficult at all -  ?Some recent data might be hidden  ? ? ?Social History  ? ?Tobacco Use  ? Smoking status: Never  ? Smokeless tobacco: Never  ?Vaping Use  ? Vaping Use: Never used  ?Substance Use Topics  ? Alcohol use: No  ?  Comment: drink occasionally   ? Drug use: No  ? ? ?Review of Systems ?Per HPI unless specifically indicated above ? ?   ?Objective:  ?  ?BP (!) 101/48   Pulse 99   Ht '4\' 10"'$  (1.473 m)   Wt 97 lb (44 kg)   SpO2 95%   BMI 20.27 kg/m?   ?Wt Readings from Last 3 Encounters:  ?01/16/22 97 lb (44 kg)  ?12/23/21 97 lb (44 kg)  ?12/13/21 94 lb (42.6 kg)  ?  ?Physical Exam ?Vitals and nursing note reviewed.  ?Constitutional:   ?   General: She is not in acute distress. ?   Appearance: Normal appearance. She is well-developed. She is not diaphoretic.   ?   Comments: Well-appearing, comfortable, cooperative  ?HENT:  ?   Head: Normocephalic and atraumatic.  ?Eyes:  ?   General:     ?   Right eye: No discharge.     ?   Left eye: No discharge.  ?   Conjunctiva/sclera: Conjunctivae normal.  ?Cardiovascular:  ?   Rate and Rhythm: Normal rate.  ?Pulmonary:  ?   Effort: Pulmonary effort is normal.  ?Musculoskeletal:  ?   Comments: Left anterior upper arm with stitches see picture, larger area of hematoma collection  ?Skin: ?   General: Skin is warm and dry.  ?   Findings: No erythema or rash.  ?Neurological:  ?   Mental Status: She is alert and oriented to person, place, and time.  ?Psychiatric:     ?   Mood and Affect: Mood normal.     ?   Behavior: Behavior normal.     ?   Thought Content: Thought content normal.  ?   Comments: Well groomed, good eye contact, normal speech and thoughts  ? ? ?Left upper arm anterior, suture site. ? ? ? ?Results for orders placed or  performed during the hospital encounter of 12/23/21  ?Resp Panel by RT-PCR (Flu A&B, Covid) Nasopharyngeal Swab  ? Specimen: Nasopharyngeal Swab; Nasopharyngeal(NP) swabs in vial transport medium  ?Result Value Ref Range  ? SARS Coronavirus 2 by RT PCR NEGATIVE NEGATIVE  ? Influenza A by PCR NEGATIVE NEGATIVE  ? Influenza B by PCR NEGATIVE NEGATIVE  ?CBC  ?Result Value Ref Range  ? WBC 11.2 (H) 4.0 - 10.5 K/uL  ? RBC 3.64 (L) 3.87 - 5.11 MIL/uL  ? Hemoglobin 11.2 (L) 12.0 - 15.0 g/dL  ? HCT 34.7 (L) 36.0 - 46.0 %  ? MCV 95.3 80.0 - 100.0 fL  ? MCH 30.8 26.0 - 34.0 pg  ? MCHC 32.3 30.0 - 36.0 g/dL  ? RDW 14.2 11.5 - 15.5 %  ? Platelets 262 150 - 400 K/uL  ? nRBC 0.0 0.0 - 0.2 %  ?CBC  ?Result Value Ref Range  ? WBC 7.1 4.0 - 10.5 K/uL  ? RBC 2.84 (L) 3.87 - 5.11 MIL/uL  ? Hemoglobin 8.9 (L) 12.0 - 15.0 g/dL  ? HCT 27.6 (L) 36.0 - 46.0 %  ? MCV 97.2 80.0 - 100.0 fL  ? MCH 31.3 26.0 - 34.0 pg  ? MCHC 32.2 30.0 - 36.0 g/dL  ? RDW 14.6 11.5 - 15.5 %  ? Platelets 217 150 - 400 K/uL  ? nRBC 0.0 0.0 - 0.2 %  ?Basic  metabolic panel  ?Result Value Ref Range  ? Sodium 137 135 - 145 mmol/L  ? Potassium 3.9 3.5 - 5.1 mmol/L  ? Chloride 107 98 - 111 mmol/L  ? CO2 26 22 - 32 mmol/L  ? Glucose, Bld 109 (H) 70 - 99 mg/dL  ? BUN 15 8 - 23 mg/dL  ? Creatinine, Ser 0.72 0.44 - 1.00 mg/dL  ? Calcium 7.8 (L) 8.9 - 10.3 mg/dL  ? GFR, Estimated >60 >60 mL/min  ? Anion gap 4 (L) 5 - 15  ?Type and screen [place]  ?Result Value Ref Range  ? ABO/RH(D) O NEG   ? Antibody Screen NEG   ? Sample Expiration    ?  12/26/2021,2359 ?Performed at [facility], [address], [place], [place] [address] ?  ? ?   ?Assessment & Plan:  ? ?Problem List Items Addressed This Visit   ?None ?Visit Diagnoses   ? ? Left arm pain    -  Primary  ? Closed nondisplaced transcondylar fracture of left humerus with routine healing, subsequent encounter      ? Hematoma of arm, left, sequela      ? Relevant Medications  ? mupirocin ointment (BACTROBAN) 2 %  ? ?  ?  ?Recommend proceed with more urgent quicker Orthopedic post-op follow-up for eval stitches and wound from Left humerus fracture. ? ?Patient/husband asked to call their office and try to get worked in sooner than Friday. ? ?Klifto, Merwyn Katos, MD   ?[address]   ?[place]   ?[place], [place] [address]   ?[phone] (Work)   ?[phone] (Fax)   ? ?We can fax copy of record and call as well to find out if this is possible. ? ?Rx Mupirocin to use topical on area of stitches to prevent secondary infection. But mostly seems to be hematoma and no sign of obvious cellulitis or skin infection ? ? ?Meds ordered this encounter  ?Medications  ? mupirocin ointment (BACTROBAN) 2 %  ?  Sig: Apply 1 application. topically 2 (two) times  daily. For 7-10 days  ?  Dispense:  30 g  ?  Refill:  0  ? ? ? ? ?Follow up plan: ?Return if symptoms worsen or fail to improve. ? ?[name], DO ?[facility] ?[facility] ?01/16/2022, 11:34 AM ?

## 2022-01-20 DIAGNOSIS — S42292P Other displaced fracture of upper end of left humerus, subsequent encounter for fracture with malunion: Secondary | ICD-10-CM | POA: Diagnosis not present

## 2022-01-20 DIAGNOSIS — W010XXD Fall on same level from slipping, tripping and stumbling without subsequent striking against object, subsequent encounter: Secondary | ICD-10-CM | POA: Diagnosis not present

## 2022-01-20 DIAGNOSIS — R5381 Other malaise: Secondary | ICD-10-CM | POA: Diagnosis not present

## 2022-01-20 DIAGNOSIS — G5632 Lesion of radial nerve, left upper limb: Secondary | ICD-10-CM | POA: Diagnosis not present

## 2022-01-30 ENCOUNTER — Other Ambulatory Visit: Payer: Self-pay | Admitting: Family Medicine

## 2022-01-30 DIAGNOSIS — S42475D Nondisplaced transcondylar fracture of left humerus, subsequent encounter for fracture with routine healing: Secondary | ICD-10-CM

## 2022-01-30 NOTE — Telephone Encounter (Signed)
Medication Refill - Medication: oxyCODONE (OXY IR/ROXICODONE) 5 MG immediate release tablet ? ?Has the patient contacted their pharmacy? Yes.   ?(Agent: If no, request that the patient contact the pharmacy for the refill. If patient does not wish to contact the pharmacy document the reason why and proceed with request.) ?(Agent: If yes, when and what did the pharmacy advise?) ? ?Preferred Pharmacy (with phone number or street name):  ?[id number] [place] - [address]  [address] ?Phone: [phone]: [phone] ? ?Has the patient been seen for an appointment in the last year OR does the patient have an upcoming appointment? Yes.   ? ?Agent: Please be advised that RX refills may take up to 3 business days. We ask that you follow-up with your pharmacy. ? ?

## 2022-01-31 NOTE — Telephone Encounter (Signed)
Requested medications are due for refill today.  yes ? ?Requested medications are on the active medications list.  yes ? ?Last refill. 12/24/2021 #30 0 refills ? ?Future visit scheduled.   yes ? ?Notes to clinic.  Medication refill is not delegated. ? ? ? ?Requested Prescriptions  ?Pending Prescriptions Disp Refills  ? oxyCODONE (OXY IR/ROXICODONE) 5 MG immediate release tablet 30 tablet 0  ?  Sig: Take 1-2 tablets (5-10 mg total) by mouth every 4 (four) hours as needed for moderate pain (pain score 4-6).  ?  ? Not Delegated - Analgesics:  Opioid Agonists Failed - 01/30/2022 11:13 PM  ?  ?  Failed - This refill cannot be delegated  ?  ?  Failed - Urine Drug Screen completed in last 360 days  ?  ?  Passed - Valid encounter within last 3 months  ?  Recent Outpatient Visits   ? ?      ? 2 weeks ago Left arm pain  ? [place], DO  ? 1 month ago Venous insufficiency of both lower extremities  ? [place], DO  ? 3 months ago Chronic left shoulder pain  ? [facility] [place], [place], NP  ? 4 months ago Pubic lice  ? [place], DO  ? 4 months ago Closed fracture of left hip with routine healing, subsequent encounter  ? [place], DO  ? ?  ?  ?Future Appointments   ? ?        ? In 2 weeks [place]   ? ?  ? ?  ?  ?  ?  ?

## 2022-02-01 MED ORDER — OXYCODONE HCL 5 MG PO TABS
5.0000 mg | ORAL_TABLET | ORAL | 0 refills | Status: DC | PRN
Start: 2022-02-01 — End: 2022-02-13

## 2022-02-01 MED ORDER — OXYCODONE HCL 5 MG PO TABS: 5.0000 mg | ORAL_TABLET | ORAL | 0 refills | Status: DC | PRN

## 2022-02-06 ENCOUNTER — Telehealth: Payer: Self-pay | Admitting: *Deleted

## 2022-02-06 NOTE — Chronic Care Management (AMB) (Signed)
?  Care Management  ? ?Note ? ?02/06/2022 ?Name: KATILYN MILTENBERGER MRN: [id number] DOB: 1945-12-28 ? ?NILSA MACHT is a 76 y.o. year old female who is a primary care patient of Olin Hauser, DO. I reached out to Lannette Donath by phone today offer care coordination services.  ? ?Ms. Heidt was given information about care management services today including:  ?Care management services include personalized support from designated clinical staff supervised by her physician, including individualized plan of care and coordination with other care providers ?24/7 contact phone numbers for assistance for urgent and routine care needs. ?The patient may stop care management services at any time by phone call to the office staff. ? ?Patient agreed to services and verbal consent obtained.  ? ?Follow up plan: ?Telephone appointment with care management team member scheduled for:02/09/22 ? ?Laverda Sorenson  ?Care Guide, Embedded Care Coordination ?[place]  Care Management  ?Direct Dial: [phone] ? ?

## 2022-02-09 ENCOUNTER — Telehealth: Payer: Medicare Other

## 2022-02-09 ENCOUNTER — Ambulatory Visit: Payer: Self-pay

## 2022-02-09 DIAGNOSIS — J432 Centrilobular emphysema: Secondary | ICD-10-CM

## 2022-02-09 DIAGNOSIS — R296 Repeated falls: Secondary | ICD-10-CM

## 2022-02-09 DIAGNOSIS — S72002D Fracture of unspecified part of neck of left femur, subsequent encounter for closed fracture with routine healing: Secondary | ICD-10-CM

## 2022-02-09 DIAGNOSIS — Z8781 Personal history of (healed) traumatic fracture: Secondary | ICD-10-CM

## 2022-02-09 DIAGNOSIS — M170 Bilateral primary osteoarthritis of knee: Secondary | ICD-10-CM

## 2022-02-09 NOTE — Patient Instructions (Signed)
Visit Information ? ?Thank you for taking time to visit with me today. Please don't hesitate to contact me if I can be of assistance to you before our next scheduled telephone appointment. ? ? ? ? ? ?Please call the care guide team at [phone] if you need to cancel or reschedule your appointment.  ? ?If you are experiencing a Mental Health or [place] or need someone to talk to, please call the Suicide and Crisis Lifeline: 988 ?call the [place] National Suicide Prevention Lifeline: (670)803-3253 or TTY: 704-132-2011 TTY 5876919500) to talk to a trained counselor ?call 1-800-273-TALK (toll free, 24 hour hotline)  ? ?Following is a copy of your full plan of care:  ?Care Plan : RNCM: General Plan of Care (Adult) for Chronic Disease Management and Care Coordination Needs  ?Updates made by Vanita Ingles, RN since 02/09/2022 12:00 AM  ?  ? ?Problem: RNCM: Development of Plan of Care for Chronic Disease Management (HLD, COPD, Chronic Pain, Fractures from Falls)   ?Priority: High  ?  ? ?Long-Range Goal: RNCM: Development of Plan of Care for Chronic Disease Management (HLD, COPD, Chronic Pain, Fractures from Falls)   ?Start Date: 02/09/2022  ?Expected End Date: 02/10/2023  ?Priority: High  ?Note:   ?Current Barriers:  ?Knowledge Deficits related to plan of care for management of HLD, COPD, and Chronic pain and fractures from Falls  ?Care Coordination needs related to Level of care concerns and high utilization   ?Chronic Disease Management support and education needs related to HLD, COPD, Chronic Pain, and fractures from Falls ? ?RNCM Clinical Goal(s):  ?Patient will verbalize basic understanding of HLD, COPD, and Chronic pain and fractures from falls disease process and self health management plan as evidenced by keeping appointments, compliance with the plan of care and working with the CCM team to effectively manage health and well being ?take all medications exactly as prescribed and will call provider  for medication related questions as evidenced by compliance with medications and calling for refills when needed     ?attend all scheduled medical appointments: with pcp and specialist as evidenced by keeping appointments and calling for schedule change needs        ?demonstrate improved and ongoing adherence to prescribed treatment plan for HLD, COPD, and Chronic pain and fractures from falls as evidenced by stable conditions, no acute exacerbation, no new falls, and compliance with the plan  of care for chronic conditions. ?demonstrate ongoing self health care management ability for effective management of chronic conditions as evidenced by working with the CCM team through collaboration with Consulting civil engineer, provider, and care team.  ? ?Interventions: ?1:1 collaboration with primary care provider regarding development and update of comprehensive plan of care as evidenced by provider attestation and co-signature ?Inter-disciplinary care team collaboration (see longitudinal plan of care) ?Evaluation of current treatment plan related to  self management and patient's adherence to plan as established by provider ? ? ?COPD: (Status: New goal. Goal on Track (progressing): YES.) Long Term Goal  ?Reviewed medications with patient, including use of prescribed maintenance and rescue inhalers, and provided instruction on medication management and the importance of adherence ?Provided patient with basic written and verbal COPD education on self care/management/and exacerbation prevention ?Advised patient to track and manage COPD triggers ?Provided written and verbal instructions on pursed lip breathing and utilized returned demonstration as teach back ?Provided instruction about proper use of medications used for management of COPD including inhalers ?Advised patient to self assesses COPD  action plan zone and make appointment with provider if in the yellow zone for 48 hours without improvement ?Advised patient to engage in  light exercise as tolerated 3-5 days a week to aid in the the management of COPD ?Provided education about and advised patient to utilize infection prevention strategies to reduce risk of respiratory infection ?Discussed the importance of adequate rest and management of fatigue with COPD ?Screening for signs and symptoms of depression related to chronic disease state  ?Assessed social determinant of health barriers ? ?Falls:  (Status: New goal. Goal on Track (progressing): YES.) Long Term Goal  ?Provided written and verbal education re: potential causes of falls and Fall prevention strategies. 02-09-2022: the patient has had several fractures related to frequent falls. She currently is working with OT and PT and this is helping. A nurse comes in one time a week on Thursdays. The patients husband states she is being careful. Appreciates the support of the CCM team. Encouraged the patient and husband to call with any new concerns or educational needs. Will continue to monitor. Education and support given ?Reviewed medications and discussed potential side effects of medications such as dizziness and frequent urination ?Advised patient of importance of notifying provider of falls. 02-09-2022: The patient and spouse educated on notifying provider of new falls.  ?Assessed for signs and symptoms of orthostatic hypotension ?Assessed for falls since last encounter ?Assessed patients knowledge of fall risk prevention secondary to previously provided education ?Provided patient information for fall alert systems ?Advised patient to discuss fall with provider ? ?Hyperlipidemia:  (Status: New goal. Goal on Track (progressing): YES.) Long Term Goal  ?Lab Results  ?Component Value Date  ? CHOL 215 (H) 06/28/2020  ? HDL 77 06/28/2020  ? LDLCALC 114 (H) 06/28/2020  ? TRIG 125 06/28/2020  ? CHOLHDL 2.8 06/28/2020  ?  ? ?Medication review performed; medication list updated in electronic medical record.  ?Provider established cholesterol  goals reviewed; ?Counseled on importance of regular laboratory monitoring as prescribed; ?Provided HLD educational materials; ?Reviewed role and benefits of statin for ASCVD risk reduction; ?Discussed strategies to manage statin-induced myalgias; ?Reviewed importance of limiting foods high in cholesterol; ? ?Pain:  (Status: New goal. Goal on Track (progressing): YES.) Long Term Goal  ?Pain assessment performed. 02-09-2022: The patients husband states that the patient deals with a lot of pain due to past fractures and musculoskeletal pain. She denies pain currently but does not sleep well due to pain and discomfort. Education and support given ?Medications reviewed. 02-09-2022: Is compliant with medications.  ?Reviewed provider established plan for pain management; ?Discussed importance of adherence to all scheduled medical appointments; ?Counseled on the importance of reporting any/all new or changed pain symptoms or management strategies to pain management provider; ?Advised patient to report to care team affect of pain on daily activities; ?Discussed use of relaxation techniques and/or diversional activities to assist with pain reduction (distraction, imagery, relaxation, massage, acupressure, TENS, heat, and cold application; ?Reviewed with patient prescribed pharmacological and nonpharmacological pain relief strategies; ?Advised patient to discuss unresolved pain, changes in level or intensity of pain with provider; ? ?Patient Goals/Self-Care Activities: ?Take medications as prescribed   ?Attend all scheduled provider appointments ?Call pharmacy for medication refills 3-7 days in advance of running out of medications ?Attend church or other social activities ?Perform all self care activities independently  ?Perform IADL's (shopping, preparing meals, housekeeping, managing finances) independently ?Call provider office for new concerns or questions  ?Work with the Education officer, museum to address care coordination  needs and  will continue to work with the clinical team to address health care and disease management related needs ?call the Suicide and Crisis Lifeline: 988 ?call the [place] National Suicide Prevention Lifeline: 1-800

## 2022-02-09 NOTE — Chronic Care Management (AMB) (Signed)
? Care Management ?  ? RN Visit Note ? ?02/09/2022 ?Name: Pamela Ball MRN: [id number] DOB: 03/05/1946 ? ?Subjective: ?Pamela Ball is a 76 y.o. year old female who is a primary care patient of Olin Hauser, DO. The care management team was consulted for assistance with disease management and care coordination needs.   ? ?Engaged with patient by telephone for initial visit in response to provider referral for case management and/or care coordination services.  ? ?Consent to Services:  ? Ms. Mersman was given information about Care Management services today including:  ?Care Management services includes personalized support from designated clinical staff supervised by her physician, including individualized plan of care and coordination with other care providers ?24/7 contact phone numbers for assistance for urgent and routine care needs. ?The patient may stop case management services at any time by phone call to the office staff. ? ?Patient agreed to services and consent obtained.  ? ?Assessment: Review of patient past medical history, allergies, medications, health status, including review of consultants reports, laboratory and other test data, was performed as part of comprehensive evaluation and provision of chronic care management services.  ? ?SDOH (Social Determinants of Health) assessments and interventions performed:  ?SDOH Interventions   ? ?Flowsheet Row Most Recent Value  ?SDOH Interventions   ?Food Insecurity Interventions Intervention Not Indicated  ?Financial Strain Interventions Intervention Not Indicated  ?Housing Interventions Intervention Not Indicated  ?Intimate Partner Violence Interventions Intervention Not Indicated  ?Physical Activity Interventions Other (Comments)  [encouraged activity, Is working with pt/ot]  ?Stress Interventions Intervention Not Indicated  ?Social Connections Interventions Intervention Not Indicated  ?Transportation Interventions Intervention Not Indicated   ? ?  ?  ? ?Care Plan ? ?Allergies  ?Allergen Reactions  ? Tape Other (See Comments)  ?  Pulls skin off  ? ? ?Outpatient Encounter Medications as of 02/09/2022  ?Medication Sig  ? albuterol (VENTOLIN HFA) 108 (90 Base) MCG/ACT inhaler INHALE 2 PUFFS INTO THE LUNGS EVERY 4 HOURS AS NEEDED FOR WHEEZING OR SHORTNESS OF BREATH (COUGH) (Patient taking differently: Inhale 1-2 puffs into the lungs every 4 (four) hours as needed for shortness of breath or wheezing.)  ? alendronate (FOSAMAX) 70 MG tablet Take 70 mg by mouth every Wednesday. Take with a full glass of water. Do not lie down for the next 30 min.  ? ALPRAZolam (XANAX) 0.5 MG tablet Take 1 tablet (0.5 mg total) by mouth 2 (two) times daily as needed for anxiety. (Patient not taking: Reported on 11/02/2021)  ? amLODipine (NORVASC) 10 MG tablet TAKE 1 TABLET BY MOUTH EVERY DAY  ? Ascorbic Acid (VITAMIN C PO) Take 1 tablet by mouth daily.  ? aspirin EC 325 MG EC tablet Take 1 tablet (325 mg total) by mouth daily.  ? Aspirin-Caffeine (BC FAST PAIN RELIEF PO) Take 1 packet by mouth 4 (four) times daily as needed (pain).  ? buPROPion (WELLBUTRIN XL) 150 MG 24 hr tablet Take 150 mg by mouth daily.  ? busPIRone (BUSPAR) 10 MG tablet Take 10 mg by mouth daily. Prescribed by Psychiatry Dr Kasandra Knudsen  ? Cyanocobalamin (B-12 PO) Take 1 capsule by mouth daily.  ? docusate sodium (COLACE) 100 MG capsule Take 1 capsule (100 mg total) by mouth 2 (two) times daily. (Patient taking differently: Take 100 mg by mouth daily.)  ? feeding supplement (ENSURE ENLIVE / ENSURE PLUS) LIQD Take 237 mLs by mouth 3 (three) times daily between meals.  ? furosemide (LASIX) 20 MG tablet Take 1 tablet (  20 mg total) by mouth daily as needed for fluid or edema.  ? Multiple Vitamin (MULTIVITAMIN WITH MINERALS) TABS tablet Take 1 tablet by mouth daily.  ? Multiple Vitamins-Minerals (PRESERVISION AREDS PO) Take 1 tablet by mouth daily.  ? mupirocin ointment (BACTROBAN) 2 % Apply 1 application. topically 2 (two)  times daily. For 7-10 days  ? Nutritional Supplements (CARNATION BREAKFAST ESSENTIALS PO) Take 1 Dose by mouth daily.  ? OLANZapine (ZYPREXA) 7.5 MG tablet Take 7.5 mg by mouth daily.  ? omeprazole (PRILOSEC) 20 MG capsule TAKE 1 CAPSULE BY MOUTH DAILY BEFORE BREAKFAST.  ? ondansetron (ZOFRAN) 4 MG tablet Take 1 tablet (4 mg total) by mouth every 6 (six) hours as needed for nausea.  ? ondansetron (ZOFRAN-ODT) 4 MG disintegrating tablet TAKE 1 TABLET BY MOUTH EVERY 8 HOURS AS NEEDED FOR NAUSEA AND VOMITING  ? oxyCODONE (OXY IR/ROXICODONE) 5 MG immediate release tablet Take 1-2 tablets (5-10 mg total) by mouth every 4 (four) hours as needed for moderate pain (pain score 4-6).  ? simvastatin (ZOCOR) 20 MG tablet Take 1 tablet (20 mg total) by mouth daily.  ? venlafaxine XR (EFFEXOR-XR) 75 MG 24 hr capsule Take 75 mg by mouth daily with breakfast.   ? vitamin B-12 1000 MCG tablet Take 3 tablets (3,000 mcg total) by mouth daily.  ? VITAMIN D PO Take 1 capsule by mouth daily.  ? ?No facility-administered encounter medications on file as of 02/09/2022.  ? ? ?Patient Active Problem List  ? Diagnosis Date Noted  ? 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 12/23/2021  ? Venous insufficiency of both lower extremities 12/13/2021  ? Closed comminuted intra-articular fracture of distal femur, left, initial encounter ([place]) 06/23/2021  ? Closed fracture of multiple pubic rami, left, initial encounter ([place]) 06/23/2021  ? Accidental fall   ? Preoperative clearance   ? Recurrent falls   ? Macrocytosis 03/08/2021  ? Abnormal SPEP 03/08/2021  ? Rib pain on right side 08/06/2020  ? Essential hypertension 06/28/2020  ? Osteopenia of right foot 02/11/2019  ? Malnutrition of moderate degree 12/27/2018  ? COPD (chronic obstructive pulmonary disease) ([place]) 12/26/2018  ? Lumbar hernia 11/19/2018  ? Elevated troponin 09/26/2018  ? Incisional hernia, without obstruction or gangrene 08/28/2018  ? Allergic rhinitis due to allergen  05/14/2018  ? Abdominal hernia with obstruction and without gangrene   ? Normocytic anemia 05/23/2017  ? History of abdominal hernia 05/21/2017  ? Bipolar 1 disorder, depressed, full remission ([place]) 02/15/2017  ? GERD (gastroesophageal reflux disease) 02/15/2017  ? Centrilobular emphysema ([place]) 02/15/2017  ? Osteoarthritis of knees, 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  ? Hyperlipidemia 02/15/2017  ? Presbycusis of both ears 02/15/2017  ? History of compression fracture of spine 02/15/2017  ? Anxiety 02/15/2017  ? ? ?Conditions to be addressed/monitored: HLD, COPD, and Chronic pain, and fractures from falls ? ?Care Plan : RNCM: General Plan of Care (Adult) for Chronic Disease Management and Care Coordination Needs  ?Updates made by Vanita Ingles, RN since 02/09/2022 12:00 AM  ?  ? ?Problem: RNCM: Development of Plan of Care for Chronic Disease Management (HLD, COPD, Chronic Pain, Fractures from Falls)   ?Priority: High  ?  ? ?Long-Range Goal: RNCM: Development of Plan of Care for Chronic Disease Management (HLD, COPD, Chronic Pain, Fractures from Falls)   ?Start Date: 02/09/2022  ?Expected End Date: 02/10/2023  ?Priority: High  ?Note:   ?Current Barriers:  ?Knowledge Deficits related to plan of care for management of HLD, COPD, and  Chronic pain and fractures from Falls  ?Care Coordination needs related to Level of care concerns and high utilization   ?Chronic Disease Management support and education needs related to HLD, COPD, Chronic Pain, and fractures from Falls ? ?RNCM Clinical Goal(s):  ?Patient will verbalize basic understanding of HLD, COPD, and Chronic pain and fractures from falls disease process and self health management plan as evidenced by keeping appointments, compliance with the plan of care and working with the CCM team to effectively manage health and well being ?take all medications exactly as prescribed and will call provider for medication related questions as evidenced by compliance with medications and calling for  refills when needed     ?attend all scheduled medical appointments: with pcp and specialist as evidenced by keeping appointments and calling for schedule change needs        ?demonstrate improved and ong

## 2022-02-13 ENCOUNTER — Ambulatory Visit: Payer: Self-pay

## 2022-02-13 ENCOUNTER — Telehealth: Payer: Self-pay

## 2022-02-13 DIAGNOSIS — M79602 Pain in left arm: Secondary | ICD-10-CM

## 2022-02-13 DIAGNOSIS — S42475D Nondisplaced transcondylar fracture of left humerus, subsequent encounter for fracture with routine healing: Secondary | ICD-10-CM

## 2022-02-13 DIAGNOSIS — Z8781 Personal history of (healed) traumatic fracture: Secondary | ICD-10-CM

## 2022-02-13 MED ORDER — OXYCODONE HCL 5 MG PO TABS
5.0000 mg | ORAL_TABLET | ORAL | 0 refills | Status: DC | PRN
Start: 2022-02-13 — End: 2022-02-27

## 2022-02-13 NOTE — Telephone Encounter (Signed)
Copied from [place] [phone]. Topic: General - Other ?>> Feb 13, 2022 10:48 AM Tessa Lerner A wrote: ?Reason for CRM: The patient's husband would like to speak with Nurse Jeannene Patella when possible  ? ?Please contact further when available ?

## 2022-02-13 NOTE — Telephone Encounter (Signed)
Refilled oxycodone. ? ?[name], DO ?[facility] ?[facility] ?02/13/2022, 12:41 PM ? ?

## 2022-02-13 NOTE — Patient Instructions (Signed)
Visit Information ? ?Thank you for taking time to visit with me today. Please don't hesitate to contact me if I can be of assistance to you before our next scheduled telephone appointment. ? ?Pain:  (Status: New goal. Goal on Track (progressing): YES.) Long Term Goal  ?Pain assessment performed. 02-09-2022: The patients husband states that the patient deals with a lot of pain due to past fractures and musculoskeletal pain. She denies pain currently but does not sleep well due to pain and discomfort. Education and support given. 02-13-2022: The patients husband states that the patient has pain constantly and rates it at a 7/8. He called the office and ask for the [facility] to call him back. The patients husband is asking assistance with refill needs for pain medications.  ?Medications reviewed. 02-09-2022: Is compliant with medications. 02-13-2022: The patient is taking oxycodone 5 to 10 mg Q 4 hours as needed for pain relief. The patients husband states that she only has 3 pain pills left and was seeking help with refill needs. Will consult with pcp and pharm D for assistance with medication refills.  ?Reviewed provider established plan for pain management. 02-13-2022: The patients husband states that she will go to see the surgeon on Wednesday and he is also going to have to talk to the provider about her knee pain and discomfort. Encouraged him to discuss other pain options. The patients husband said the PT is helping her and she is taking this TID a week but it is painful for her. ; ?Discussed importance of adherence to all scheduled medical appointments. 02-13-2022: The patient is seeing surgeon for follow up on her shoulder on 02-15-2022.  ?Counseled on the importance of reporting any/all new or changed pain symptoms or management strategies to pain management provider. 02-13-2022: Education and support given. ; ?Advised patient to report to care team affect of pain on daily activities; ?Discussed use of relaxation techniques  and/or diversional activities to assist with pain reduction (distraction, imagery, relaxation, massage, acupressure, TENS, heat, and cold application; ?Reviewed with patient prescribed pharmacological and nonpharmacological pain relief strategies; ?Advised patient to discuss unresolved pain, changes in level or intensity of pain with provider; ? ? ?Following are the goals we discussed today:  ?Take medications as prescribed   ?Attend all scheduled provider appointments ?Call pharmacy for medication refills 3-7 days in advance of running out of medications ?Attend church or other social activities ?Perform all self care activities independently  ?Perform IADL's (shopping, preparing meals, housekeeping, managing finances) independently ?Call provider office for new concerns or questions  ?Work with the Education officer, museum to address care coordination needs and will continue to work with the clinical team to address health care and disease management related needs ?call the Suicide and Crisis Lifeline: 988 ?call the [place] National Suicide Prevention Lifeline: 204 444 0552 or TTY: 207-648-5038 TTY 509-696-2287) to talk to a trained counselor ?call 1-800-273-TALK (toll free, 24 hour hotline) if experiencing a Mental Health or [place]  ?- call for medicine refill 2 or 3 days before it runs out ?- take all medications exactly as prescribed ?- call doctor with any symptoms you believe are related to your medicine ?- call doctor when you experience any new symptoms ?- go to all doctor appointments as scheduled ?- adhere to prescribed diet: heart healthy diet ? ?Our next appointment is by telephone on 04-20-2022  ? ?Please call the care guide team at [phone] if you need to cancel or reschedule your appointment.  ? ?If you are  experiencing a Mental Health or [place] or need someone to talk to, please call the Suicide and Crisis Lifeline: 988 ?call the [place] National Suicide Prevention Lifeline:  612-265-0918 or TTY: 831-443-4715 TTY 506-016-6359) to talk to a trained counselor ?call 1-800-273-TALK (toll free, 24 hour hotline)  ? ?The patient verbalized understanding of instructions, educational materials, and care plan provided today and declined offer to receive copy of patient instructions, educational materials, and care plan.  ? ?Telephone follow up appointment with care management team member scheduled for: 04-20-2022 ? ?Noreene Larsson RN, MSN, CCM ?Community Care Coordinator ?[facility] ?[facility] ?Mobile: [phone]  ?

## 2022-02-13 NOTE — Addendum Note (Signed)
Addended by: Olin Hauser on: 02/13/2022 12:41 PM ? ? Modules accepted: Orders ? ?

## 2022-02-13 NOTE — Chronic Care Management (AMB) (Signed)
? Care Management ?  ? RN Visit Note ? ?02/13/2022 ?Name: Pamela Ball MRN: [id number] DOB: 17-Apr-1946 ? ?Subjective: ?Pamela Ball is a 76 y.o. year old female who is a primary care patient of Olin Hauser, DO. The care management team was consulted for assistance with disease management and care coordination needs.   ? ?Engaged with patient by telephone for follow up visit in response to provider referral for case management and/or care coordination services.  ? ?Consent to Services:  ? Pamela Ball was given information about Care Management services today including:  ?Care Management services includes personalized support from designated clinical staff supervised by her physician, including individualized plan of care and coordination with other care providers ?24/7 contact phone numbers for assistance for urgent and routine care needs. ?The patient may stop case management services at any time by phone call to the office staff. ? ?Patient agreed to services and consent obtained.  ? ?Assessment: Review of patient past medical history, allergies, medications, health status, including review of consultants reports, laboratory and other test data, was performed as part of comprehensive evaluation and provision of chronic care management services.  ? ?SDOH (Social Determinants of Health) assessments and interventions performed:   ? ?Care Plan ? ?Allergies  ?Allergen Reactions  ? Tape Other (See Comments)  ?  Pulls skin off  ? ? ?Outpatient Encounter Medications as of 02/13/2022  ?Medication Sig  ? albuterol (VENTOLIN HFA) 108 (90 Base) MCG/ACT inhaler INHALE 2 PUFFS INTO THE LUNGS EVERY 4 HOURS AS NEEDED FOR WHEEZING OR SHORTNESS OF BREATH (COUGH) (Patient taking differently: Inhale 1-2 puffs into the lungs every 4 (four) hours as needed for shortness of breath or wheezing.)  ? alendronate (FOSAMAX) 70 MG tablet Take 70 mg by mouth every Wednesday. Take with a full glass of water. Do not lie down for  the next 30 min.  ? ALPRAZolam (XANAX) 0.5 MG tablet Take 1 tablet (0.5 mg total) by mouth 2 (two) times daily as needed for anxiety. (Patient not taking: Reported on 11/02/2021)  ? amLODipine (NORVASC) 10 MG tablet TAKE 1 TABLET BY MOUTH EVERY DAY  ? Ascorbic Acid (VITAMIN C PO) Take 1 tablet by mouth daily.  ? aspirin EC 325 MG EC tablet Take 1 tablet (325 mg total) by mouth daily.  ? Aspirin-Caffeine (BC FAST PAIN RELIEF PO) Take 1 packet by mouth 4 (four) times daily as needed (pain).  ? buPROPion (WELLBUTRIN XL) 150 MG 24 hr tablet Take 150 mg by mouth daily.  ? busPIRone (BUSPAR) 10 MG tablet Take 10 mg by mouth daily. Prescribed by Psychiatry Dr Kasandra Knudsen  ? Cyanocobalamin (B-12 PO) Take 1 capsule by mouth daily.  ? docusate sodium (COLACE) 100 MG capsule Take 1 capsule (100 mg total) by mouth 2 (two) times daily. (Patient taking differently: Take 100 mg by mouth daily.)  ? feeding supplement (ENSURE ENLIVE / ENSURE PLUS) LIQD Take 237 mLs by mouth 3 (three) times daily between meals.  ? furosemide (LASIX) 20 MG tablet Take 1 tablet (20 mg total) by mouth daily as needed for fluid or edema.  ? Multiple Vitamin (MULTIVITAMIN WITH MINERALS) TABS tablet Take 1 tablet by mouth daily.  ? Multiple Vitamins-Minerals (PRESERVISION AREDS PO) Take 1 tablet by mouth daily.  ? mupirocin ointment (BACTROBAN) 2 % Apply 1 application. topically 2 (two) times daily. For 7-10 days  ? Nutritional Supplements (CARNATION BREAKFAST ESSENTIALS PO) Take 1 Dose by mouth daily.  ? OLANZapine (ZYPREXA) 7.5 MG  tablet Take 7.5 mg by mouth daily.  ? omeprazole (PRILOSEC) 20 MG capsule TAKE 1 CAPSULE BY MOUTH DAILY BEFORE BREAKFAST.  ? ondansetron (ZOFRAN) 4 MG tablet Take 1 tablet (4 mg total) by mouth every 6 (six) hours as needed for nausea.  ? ondansetron (ZOFRAN-ODT) 4 MG disintegrating tablet TAKE 1 TABLET BY MOUTH EVERY 8 HOURS AS NEEDED FOR NAUSEA AND VOMITING  ? oxyCODONE (OXY IR/ROXICODONE) 5 MG immediate release tablet Take 1-2  tablets (5-10 mg total) by mouth every 4 (four) hours as needed for moderate pain (pain score 4-6).  ? simvastatin (ZOCOR) 20 MG tablet Take 1 tablet (20 mg total) by mouth daily.  ? venlafaxine XR (EFFEXOR-XR) 75 MG 24 hr capsule Take 75 mg by mouth daily with breakfast.   ? vitamin B-12 1000 MCG tablet Take 3 tablets (3,000 mcg total) by mouth daily.  ? VITAMIN D PO Take 1 capsule by mouth daily.  ? ?No facility-administered encounter medications on file as of 02/13/2022.  ? ? ?Patient Active Problem List  ? Diagnosis Date Noted  ? 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 12/23/2021  ? Venous insufficiency of both lower extremities 12/13/2021  ? Closed comminuted intra-articular fracture of distal femur, left, initial encounter ([place]) 06/23/2021  ? Closed fracture of multiple pubic rami, left, initial encounter ([place]) 06/23/2021  ? Accidental fall   ? Preoperative clearance   ? Recurrent falls   ? Macrocytosis 03/08/2021  ? Abnormal SPEP 03/08/2021  ? Rib pain on right side 08/06/2020  ? Essential hypertension 06/28/2020  ? Osteopenia of right foot 02/11/2019  ? Malnutrition of moderate degree 12/27/2018  ? COPD (chronic obstructive pulmonary disease) ([place]) 12/26/2018  ? Lumbar hernia 11/19/2018  ? Elevated troponin 09/26/2018  ? Incisional hernia, without obstruction or gangrene 08/28/2018  ? Allergic rhinitis due to allergen 05/14/2018  ? Abdominal hernia with obstruction and without gangrene   ? Normocytic anemia 05/23/2017  ? History of abdominal hernia 05/21/2017  ? Bipolar 1 disorder, depressed, full remission ([place]) 02/15/2017  ? GERD (gastroesophageal reflux disease) 02/15/2017  ? Centrilobular emphysema ([place]) 02/15/2017  ? Osteoarthritis of knees, 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  ? Hyperlipidemia 02/15/2017  ? Presbycusis of both ears 02/15/2017  ? History of compression fracture of spine 02/15/2017  ? Anxiety 02/15/2017  ? ? ?Conditions to be addressed/monitored:  Chronic pain ? ?Care Plan : RNCM:  General Plan of Care (Adult) for Chronic Disease Management and Care Coordination Needs  ?Updates made by Vanita Ingles, RN since 02/13/2022 12:00 AM  ?  ? ?Problem: RNCM: Development of Plan of Care for Chronic Disease Management (HLD, COPD, Chronic Pain, Fractures from Falls)   ?Priority: High  ?  ? ?Long-Range Goal: RNCM: Development of Plan of Care for Chronic Disease Management (HLD, COPD, Chronic Pain, Fractures from Falls)   ?Start Date: 02/09/2022  ?Expected End Date: 02/10/2023  ?Priority: High  ?Note:   ?Current Barriers:  ?Knowledge Deficits related to plan of care for management of HLD, COPD, and Chronic pain and fractures from Falls  ?Care Coordination needs related to Level of care concerns and high utilization   ?Chronic Disease Management support and education needs related to HLD, COPD, Chronic Pain, and fractures from Falls ? ?RNCM Clinical Goal(s):  ?Patient will verbalize basic understanding of HLD, COPD, and Chronic pain and fractures from falls disease process and self health management plan as evidenced by keeping appointments, compliance with the plan of care and working with the CCM team to effectively  manage health and well being ?take all medications exactly as prescribed and will call provider for medication related questions as evidenced by compliance with medications and calling for refills when needed     ?attend all scheduled medical appointments: with pcp and specialist as evidenced by keeping appointments and calling for schedule change needs        ?demonstrate improved and ongoing adherence to prescribed treatment plan for HLD, COPD, and Chronic pain and fractures from falls as evidenced by stable conditions, no acute exacerbation, no new falls, and compliance with the plan  of care for chronic conditions. ?demonstrate ongoing self health care management ability for effective management of chronic conditions as evidenced by working with the CCM team through collaboration with Consulting civil engineer, provider, and care team.  ? ?Interventions: ?1:1 collaboration with primary care provider regarding development and update of comprehensive plan of care as evidenced by provider attestation and co-signa

## 2022-02-14 ENCOUNTER — Ambulatory Visit: Payer: Medicare Other

## 2022-02-15 DIAGNOSIS — S42292P Other displaced fracture of upper end of left humerus, subsequent encounter for fracture with malunion: Secondary | ICD-10-CM | POA: Diagnosis not present

## 2022-02-15 DIAGNOSIS — Z96611 Presence of right artificial shoulder joint: Secondary | ICD-10-CM | POA: Insufficient documentation

## 2022-02-16 ENCOUNTER — Ambulatory Visit (INDEPENDENT_AMBULATORY_CARE_PROVIDER_SITE_OTHER): Payer: Medicare Other

## 2022-02-16 DIAGNOSIS — Z0000 Encounter for general adult medical examination without abnormal findings: Secondary | ICD-10-CM | POA: Diagnosis not present

## 2022-02-16 NOTE — Progress Notes (Signed)
? ? ?I connected with Pamela Ball today by telephone and verified that I am speaking with the correct person using two identifiers. ?Location patient: home ?Location provider: work ?Persons participating in the virtual visit:  Brayden, Betters, and Donnie Mesa, CMA ?  ?I discussed the limitations, risks, security and privacy concerns of performing an evaluation and management service by telephone and the availability of in person appointments. I also discussed with the patient that there may be a patient responsible charge related to this service. The patient expressed understanding and verbally consented to this telephonic visit.  ?  ? ?Subjective:  ? Pamela Ball is a 76 y.o. female who presents for Medicare Annual (Subsequent) preventive examination. ? ?Review of Systems    ?Per HPI unless specifically indicated below  ?  ? ?   ?Objective:  ?  ?Today's Vitals  ? 02/16/22 1101  ?PainSc: 0-No pain  ? ?There is no height or weight on file to calculate BMI. ? ? ?  12/23/2021  ?  5:00 PM 12/23/2021  ?  7:22 AM 12/05/2021  ?  9:44 AM 06/23/2021  ?  2:06 PM 03/22/2021  ? 10:00 AM 03/08/2021  ? 11:04 AM 02/08/2021  ?  1:32 PM  ?Advanced Directives  ?Does Patient Have a Medical Advance Directive?  No No No No No No  ?Would patient like information on creating a medical advance directive? No - Patient declined  No - Patient declined No - Patient declined     ? ? ?Current Medications (verified) ?Outpatient Encounter Medications as of 02/16/2022  ?Medication Sig  ? albuterol (VENTOLIN HFA) 108 (90 Base) MCG/ACT inhaler INHALE 2 PUFFS INTO THE LUNGS EVERY 4 HOURS AS NEEDED FOR WHEEZING OR SHORTNESS OF BREATH (COUGH) (Patient taking differently: Inhale 1-2 puffs into the lungs every 4 (four) hours as needed for shortness of breath or wheezing.)  ? alendronate (FOSAMAX) 70 MG tablet Take 70 mg by mouth every Wednesday. Take with a full glass of water. Do not lie down for the next 30 min.  ? ALPRAZolam (XANAX) 0.5 MG  tablet Take 1 tablet (0.5 mg total) by mouth 2 (two) times daily as needed for anxiety.  ? amLODipine (NORVASC) 10 MG tablet TAKE 1 TABLET BY MOUTH EVERY DAY  ? Ascorbic Acid (VITAMIN C PO) Take 1 tablet by mouth daily.  ? aspirin EC 325 MG EC tablet Take 1 tablet (325 mg total) by mouth daily.  ? Aspirin-Caffeine (BC FAST PAIN RELIEF PO) Take 1 packet by mouth 4 (four) times daily as needed (pain).  ? buPROPion (WELLBUTRIN XL) 150 MG 24 hr tablet Take 150 mg by mouth daily.  ? busPIRone (BUSPAR) 10 MG tablet Take 10 mg by mouth daily. Prescribed by Psychiatry Dr Kasandra Knudsen  ? Calcium-Vitamin D-Vitamin K ([employer] CALCIUM SOFT CHEWS PO) Take by mouth.  ? Cyanocobalamin (B-12 PO) Take 1 capsule by mouth daily.  ? docusate sodium (COLACE) 100 MG capsule Take 1 capsule (100 mg total) by mouth 2 (two) times daily. (Patient taking differently: Take 100 mg by mouth daily.)  ? feeding supplement (ENSURE ENLIVE / ENSURE PLUS) LIQD Take 237 mLs by mouth 3 (three) times daily between meals.  ? furosemide (LASIX) 20 MG tablet Take 1 tablet (20 mg total) by mouth daily as needed for fluid or edema.  ? Multiple Vitamin (MULTIVITAMIN WITH MINERALS) TABS tablet Take 1 tablet by mouth daily.  ? Multiple Vitamins-Minerals (PRESERVISION AREDS PO) Take 1 tablet by mouth daily.  ?  mupirocin ointment (BACTROBAN) 2 % Apply 1 application. topically 2 (two) times daily. For 7-10 days  ? Nutritional Supplements (CARNATION BREAKFAST ESSENTIALS PO) Take 1 Dose by mouth daily.  ? OLANZapine (ZYPREXA) 7.5 MG tablet Take 7.5 mg by mouth daily.  ? omeprazole (PRILOSEC) 20 MG capsule TAKE 1 CAPSULE BY MOUTH DAILY BEFORE BREAKFAST.  ? ondansetron (ZOFRAN-ODT) 4 MG disintegrating tablet TAKE 1 TABLET BY MOUTH EVERY 8 HOURS AS NEEDED FOR NAUSEA AND VOMITING  ? oxyCODONE (OXY IR/ROXICODONE) 5 MG immediate release tablet Take 1-2 tablets (5-10 mg total) by mouth every 4 (four) hours as needed for moderate pain (pain score 4-6).  ? simvastatin (ZOCOR) 20 MG tablet  Take 1 tablet (20 mg total) by mouth daily.  ? venlafaxine XR (EFFEXOR-XR) 75 MG 24 hr capsule Take 75 mg by mouth daily with breakfast.   ? vitamin B-12 1000 MCG tablet Take 3 tablets (3,000 mcg total) by mouth daily.  ? VITAMIN D PO Take 1 capsule by mouth daily.  ? [DISCONTINUED] ondansetron (ZOFRAN) 4 MG tablet Take 1 tablet (4 mg total) by mouth every 6 (six) hours as needed for nausea. (Patient not taking: Reported on 02/16/2022)  ? ?No facility-administered encounter medications on file as of 02/16/2022.  ? ? ?Allergies (verified) ?Tape  ? ?History: ?Past Medical History:  ?Diagnosis Date  ? Abdominal hernia with obstruction and without gangrene   ? Anxiety 02/15/2017  ? denies  ? Asthma   ? Bipolar 1 disorder, depressed, full remission ([place]) 02/15/2017  ? Bipolar affective disorder ([place])   ? COPD (chronic obstructive pulmonary disease) ([place])   ? Depression   ? GERD (gastroesophageal reflux disease) 02/15/2017  ? Glaucoma   ? Headache   ? Hiatal hernia   ? History of abdominal hernia 05/21/2017  ? History of compression fracture of spine 02/15/2017  ? Hyperlipidemia   ? Hypertension   ? Incisional hernia   ? Incisional ventral hernia w obstruction 06/02/2017  ? Normocytic anemia 05/23/2017  ? Osteoarthritis of knees, 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  ? Presbycusis of both ears 02/15/2017  ? ?Past Surgical History:  ?Procedure Laterality Date  ? ABDOMINAL HYSTERECTOMY  11/06/2006  ? APPENDECTOMY    ? BREAST BIOPSY Right 12/14/2020  ? stereo bx of distortion/asymmetry, coil marker, path pending  ? BREAST EXCISIONAL BIOPSY Left   ? age 25- benign   ? CESAREAN SECTION    ? x3  ? COLONOSCOPY WITH PROPOFOL N/A 06/25/2018  ? Procedure: COLONOSCOPY WITH PROPOFOL;  Surgeon: Jonathon Bellows, MD;  Location[facility] ENDOSCOPY;  Service: Endoscopy;  Laterality: N/A;  ? KYPHOPLASTY    ? KYPHOPLASTY N/A 09/23/2018  ? Procedure: KYPHOPLASTY T10;  Surgeon: Hessie Knows, MD;  Location: [facility] ORS;  Service: Orthopedics;  Laterality: N/A;  ? KYPHOPLASTY N/A  03/11/2020  ? Procedure: L3 KYPHOPLASTY;  Surgeon: Hessie Knows, MD;  Location: [facility] ORS;  Service: Orthopedics;  Laterality: N/A;  ? KYPHOPLASTY N/A 12/30/2020  ? Procedure: L5 KYPHOPLASTY;  Surgeon: Hessie Knows, MD;  Location: [facility] ORS;  Service: Orthopedics;  Laterality: N/A;  ? ORIF FEMUR FRACTURE Left 06/23/2021  ? Procedure: OPEN REDUCTION INTERNAL FIXATION (ORIF) DISTAL FEMUR FRACTURE;  Surgeon: Hessie Knows, MD;  Location: [facility] ORS;  Service: Orthopedics;  Laterality: Left;  ? ORIF HUMERUS FRACTURE Left 08/07/2019  ? Procedure: OPEN REDUCTION INTERNAL FIXATION (ORIF) LEFT DISTAL HUMERUS FRACTURE;  Surgeon: Altamese Froid, MD;  Location: [place];  Service: Orthopedics;  Laterality: Left;  ? ORIF HUMERUS FRACTURE Left 12/23/2021  ? Procedure:  OPEN REDUCTION INTERNAL FIXATION (ORIF) HUMERAL SHAFT FRACTURE;  Surgeon: Leim Fabry, MD;  Location: [facility] ORS;  Service: Orthopedics;  Laterality: Left;  ? REVERSE SHOULDER ARTHROPLASTY Left 12/23/2021  ? Procedure: ATTEMPTED Left reverse shoulder arthroplasty;  Surgeon: Leim Fabry, MD;  Location: [facility] ORS;  Service: Orthopedics;  Laterality: Left;  ? VENTRAL HERNIA REPAIR N/A 06/02/2017  ? Procedure: exploratory laparotomy repair of incarcerated  VENTRAL hernia;  Surgeon: Florene Glen, MD;  Location: [facility] ORS;  Service: General;  Laterality: N/A;  ? VENTRAL HERNIA REPAIR N/A 08/29/2018  ? Procedure: LAPAROSCOPIC INCISIONAL HERNIA;  Surgeon: Olean Ree, MD;  Location: [facility] ORS;  Service: General;  Laterality: N/A;  ? ?Family History  ?Problem Relation Age of Onset  ? Cancer Mother   ?     cancer  ? Breast cancer Mother 62  ? Hypertension Father   ? ?Social History  ? ?Socioeconomic History  ? Marital status: Married  ?  Spouse name: Margaretta Chittum  ? Number of children: Not on file  ? Years of education: High School  ? Highest education level: Not on file  ?Occupational History  ? Occupation: retired  ?Tobacco Use  ? Smoking status: Never  ? Smokeless tobacco: Never   ?Vaping Use  ? Vaping Use: Never used  ?Substance and Sexual Activity  ? Alcohol use: No  ?  Comment: drink occasionally   ? Drug use: No  ? Sexual activity: Not Currently  ?Other Topics Concern  ? No

## 2022-02-16 NOTE — Patient Instructions (Signed)
Fall Prevention in the Home, Adult ?Falls can cause injuries and can happen to people of all ages. There are many things you can do to make your home safe and to help prevent falls. Ask for help when making these changes. ?What actions can I take to prevent falls? ?General Instructions ?Use good lighting in all rooms. Replace any light bulbs that burn out. ?Turn on the lights in dark areas. Use night-lights. ?Keep items that you use often in easy-to-reach places. Lower the shelves around your home if needed. ?Set up your furniture so you have a clear path. Avoid moving your furniture around. ?Do not have throw rugs or other things on the floor that can make you trip. ?Avoid walking on wet floors. ?If any of your floors are uneven, fix them. ?Add color or contrast paint or tape to clearly mark and help you see: ?Grab bars or handrails. ?First and last steps of staircases. ?Where the edge of each step is. ?If you use a stepladder: ?Make sure that it is fully opened. Do not climb a closed stepladder. ?Make sure the sides of the stepladder are locked in place. ?Ask someone to hold the stepladder while you use it. ?Know where your pets are when moving through your home. ?What can I do in the bathroom? ?  ?Keep the floor dry. Clean up any water on the floor right away. ?Remove soap buildup in the tub or shower. ?Use nonskid mats or decals on the floor of the tub or shower. ?Attach bath mats securely with double-sided, nonslip rug tape. ?If you need to sit down in the shower, use a plastic, nonslip stool. ?Install grab bars by the toilet and in the tub and shower. Do not use towel bars as grab bars. ?What can I do in the bedroom? ?Make sure that you have a light by your bed that is easy to reach. ?Do not use any sheets or blankets for your bed that hang to the floor. ?Have a firm chair with side arms that you can use for support when you get dressed. ?What can I do in the kitchen? ?Clean up any spills right away. ?If you  need to reach something above you, use a step stool with a grab bar. ?Keep electrical cords out of the way. ?Do not use floor polish or wax that makes floors slippery. ?What can I do with my stairs? ?Do not leave any items on the stairs. ?Make sure that you have a light switch at the top and the bottom of the stairs. ?Make sure that there are handrails on both sides of the stairs. Fix handrails that are broken or loose. ?Install nonslip stair treads on all your stairs. ?Avoid having throw rugs at the top or bottom of the stairs. ?Choose a carpet that does not hide the edge of the steps on the stairs. ?Check carpeting to make sure that it is firmly attached to the stairs. Fix carpet that is loose or worn. ?What can I do on the outside of my home? ?Use bright outdoor lighting. ?Fix the edges of walkways and driveways and fix any cracks. ?Remove anything that might make you trip as you walk through a door, such as a raised step or threshold. ?Trim any bushes or trees on paths to your home. ?Check to see if handrails are loose or broken and that both sides of all steps have handrails. ?Install guardrails along the edges of any raised decks and porches. ?Clear paths of anything that can  make you trip, such as tools or rocks. ?Have leaves, snow, or ice cleared regularly. ?Use sand or salt on paths during winter. ?Clean up any spills in your garage right away. This includes grease or oil spills. ?What other actions can I take? ?Wear shoes that: ?Have a low heel. Do not wear high heels. ?Have rubber bottoms. ?Feel good on your feet and fit well. ?Are closed at the toe. Do not wear open-toe sandals. ?Use tools that help you move around if needed. These include: ?Canes. ?Walkers. ?Scooters. ?Crutches. ?Review your medicines with your doctor. Some medicines can make you feel dizzy. This can increase your chance of falling. ?Ask your doctor what else you can do to help prevent falls. ?Where to find more information ?Centers for  Disease Control and Prevention, [facility]: http://www.wolf.info/ ?[employer] on Aging: http://kim-miller.com/ ?Contact a doctor if: ?You are afraid of falling at home. ?You feel weak, drowsy, or dizzy at home. ?You fall at home. ?Summary ?There are many simple things that you can do to make your home safe and to help prevent falls. ?Ways to make your home safe include removing things that can make you trip and installing grab bars in the bathroom. ?Ask for help when making these changes in your home. ?This information is not intended to replace advice given to you by your health care provider. Make sure you discuss any questions you have with your health care provider. ?Document Revised: 05/26/2020 Document Reviewed: 05/26/2020 ?Elsevier Patient Education ? [place]. ? ?Health Maintenance, Female ?Adopting a healthy lifestyle and getting preventive care are important in promoting health and wellness. Ask your health care provider about: ?The right schedule for you to have regular tests and exams. ?Things you can do on your own to prevent diseases and keep yourself healthy. ?What should I know about diet, weight, and exercise? ?Eat a healthy diet ? ?Eat a diet that includes plenty of vegetables, fruits, low-fat dairy products, and lean protein. ?Do not eat a lot of foods that are high in solid fats, added sugars, or sodium. ?Maintain a healthy weight ?Body mass index (BMI) is used to identify weight problems. It estimates body fat based on height and weight. Your health care provider can help determine your BMI and help you achieve or maintain a healthy weight. ?Get regular exercise ?Get regular exercise. This is one of the most important things you can do for your health. Most adults should: ?Exercise for at least 150 minutes each week. The exercise should increase your heart rate and make you sweat (moderate-intensity exercise). ?Do strengthening exercises at least twice a week. This is in addition to the  moderate-intensity exercise. ?Spend less time sitting. Even light physical activity can be beneficial. ?Watch cholesterol and blood lipids ?Have your blood tested for lipids and cholesterol at 77 years of age, then have this test every 5 years. ?Have your cholesterol levels checked more often if: ?Your lipid or cholesterol levels are high. ?You are older than 77 years of age. ?You are at high risk for heart disease. ?What should I know about cancer screening? ?Depending on your health history and family history, you may need to have cancer screening at various ages. This may include screening for: ?Breast cancer. ?Cervical cancer. ?Colorectal cancer. ?Skin cancer. ?Lung cancer. ?What should I know about heart disease, diabetes, and high blood pressure? ?Blood pressure and heart disease ?High blood pressure causes heart disease and increases the risk of stroke. This is more likely to develop in  people who have high blood pressure readings or are overweight. ?Have your blood pressure checked: ?Every 3-5 years if you are 62-40 years of age. ?Every year if you are 7 years old or older. ?Diabetes ?Have regular diabetes screenings. This checks your fasting blood sugar level. Have the screening done: ?Once every three years after age 77 if you are at a normal weight and have a low risk for diabetes. ?More often and at a younger age if you are overweight or have a high risk for diabetes. ?What should I know about preventing infection? ?Hepatitis B ?If you have a higher risk for hepatitis B, you should be screened for this virus. Talk with your health care provider to find out if you are at risk for hepatitis B infection. ?Hepatitis C ?Testing is recommended for: ?Everyone born from [birth year] through 1966. ?Anyone with known risk factors for hepatitis C. ?Sexually transmitted infections (STIs) ?Get screened for STIs, including gonorrhea and chlamydia, if: ?You are sexually active and are younger than 77 years of age. ?You are  older than 77 years of age and your health care provider tells you that you are at risk for this type of infection. ?Your sexual activity has changed since you were last screened, and you are at increased risk for

## 2022-02-20 DIAGNOSIS — R5381 Other malaise: Secondary | ICD-10-CM | POA: Diagnosis not present

## 2022-02-20 DIAGNOSIS — M1710 Unilateral primary osteoarthritis, unspecified knee: Secondary | ICD-10-CM | POA: Diagnosis not present

## 2022-02-20 DIAGNOSIS — S728X2D Other fracture of left femur, subsequent encounter for closed fracture with routine healing: Secondary | ICD-10-CM | POA: Diagnosis not present

## 2022-02-20 DIAGNOSIS — M1650 Unilateral post-traumatic osteoarthritis, unspecified hip: Secondary | ICD-10-CM | POA: Diagnosis not present

## 2022-02-20 DIAGNOSIS — T8484XD Pain due to internal orthopedic prosthetic devices, implants and grafts, subsequent encounter: Secondary | ICD-10-CM | POA: Diagnosis not present

## 2022-02-27 ENCOUNTER — Other Ambulatory Visit: Payer: Self-pay | Admitting: Family Medicine

## 2022-02-27 DIAGNOSIS — M80022P Age-related osteoporosis with current pathological fracture, left humerus, subsequent encounter for fracture with malunion: Secondary | ICD-10-CM | POA: Diagnosis not present

## 2022-02-27 DIAGNOSIS — R531 Weakness: Secondary | ICD-10-CM | POA: Diagnosis not present

## 2022-02-27 DIAGNOSIS — F32A Depression, unspecified: Secondary | ICD-10-CM | POA: Diagnosis not present

## 2022-02-27 DIAGNOSIS — Z96612 Presence of left artificial shoulder joint: Secondary | ICD-10-CM | POA: Diagnosis not present

## 2022-02-27 DIAGNOSIS — D472 Monoclonal gammopathy: Secondary | ICD-10-CM | POA: Diagnosis not present

## 2022-02-27 DIAGNOSIS — Z79899 Other long term (current) drug therapy: Secondary | ICD-10-CM | POA: Diagnosis not present

## 2022-02-27 DIAGNOSIS — I10 Essential (primary) hypertension: Secondary | ICD-10-CM | POA: Diagnosis not present

## 2022-02-27 DIAGNOSIS — Z9181 History of falling: Secondary | ICD-10-CM | POA: Diagnosis not present

## 2022-02-27 DIAGNOSIS — M164 Bilateral post-traumatic osteoarthritis of hip: Secondary | ICD-10-CM

## 2022-02-27 DIAGNOSIS — Z7982 Long term (current) use of aspirin: Secondary | ICD-10-CM | POA: Diagnosis not present

## 2022-02-27 DIAGNOSIS — I252 Old myocardial infarction: Secondary | ICD-10-CM | POA: Diagnosis not present

## 2022-02-27 DIAGNOSIS — J449 Chronic obstructive pulmonary disease, unspecified: Secondary | ICD-10-CM | POA: Diagnosis not present

## 2022-02-27 DIAGNOSIS — S72002D Fracture of unspecified part of neck of left femur, subsequent encounter for closed fracture with routine healing: Secondary | ICD-10-CM

## 2022-02-27 DIAGNOSIS — M170 Bilateral primary osteoarthritis of knee: Secondary | ICD-10-CM | POA: Diagnosis not present

## 2022-02-27 DIAGNOSIS — Z8673 Personal history of transient ischemic attack (TIA), and cerebral infarction without residual deficits: Secondary | ICD-10-CM | POA: Diagnosis not present

## 2022-02-27 DIAGNOSIS — E785 Hyperlipidemia, unspecified: Secondary | ICD-10-CM | POA: Diagnosis not present

## 2022-02-27 DIAGNOSIS — D62 Acute posthemorrhagic anemia: Secondary | ICD-10-CM | POA: Diagnosis not present

## 2022-02-27 DIAGNOSIS — S42475D Nondisplaced transcondylar fracture of left humerus, subsequent encounter for fracture with routine healing: Secondary | ICD-10-CM

## 2022-02-27 NOTE — Telephone Encounter (Signed)
Medication Refill - Medication: oxyCODONE (OXY IR/ROXICODONE) 5 MG immediate release tablet (5 tablets left, 3x daily)   Patient requesting to speak with "Pam" PCP nurse to confirm she got this message ? ?Has the patient contacted their pharmacy? No  ? ?(Agent: If no, request that the patient contact the pharmacy for the refill. If patient does not wish to contact the pharmacy document the reason why and proceed with request.) ? ?Preferred Pharmacy (with phone number or street name):  ? ?[id number] [place] [address] Phone:  [phone] ?Fax:  [phone] ?  ? ? ?Has the patient been seen for an appointment in the last year OR does the patient have an upcoming appointment? Yes.   ? ?Agent: Please be advised that RX refills may take up to 3 business days. We ask that you follow-up with your pharmacy. ?

## 2022-02-28 NOTE — Telephone Encounter (Signed)
Requested medication (s) are due for refill today: patient requesting refill ? ?Requested medication (s) are on the active medication list: yes ? ?Last refill:  02/13/22 #30 0 refills ? ?Future visit scheduled: no ? ?Notes to clinic:  not delegated per protocol. Do you want to refill Rx? ? ? ?  ?Requested Prescriptions  ?Pending Prescriptions Disp Refills  ? oxyCODONE (OXY IR/ROXICODONE) 5 MG immediate release tablet 30 tablet 0  ?  Sig: Take 1-2 tablets (5-10 mg total) by mouth every 4 (four) hours as needed for moderate pain (pain score 4-6).  ?  ? Not Delegated - Analgesics:  Opioid Agonists Failed - 02/27/2022  1:48 PM  ?  ?  Failed - This refill cannot be delegated  ?  ?  Failed - Urine Drug Screen completed in last 360 days  ?  ?  Passed - Valid encounter within last 3 months  ?  Recent Outpatient Visits   ? ?      ? 1 month ago Left arm pain  ? [place], DO  ? 2 months ago Venous insufficiency of both lower extremities  ? [place], DO  ? 3 months ago Chronic left shoulder pain  ? [facility] [place], [place], NP  ? 4 months ago Pubic lice  ? [place], DO  ? 5 months ago Closed fracture of left hip with routine healing, subsequent encounter  ? [place], DO  ? ?  ?  ? ? ?  ?  ?  ? ?

## 2022-03-01 ENCOUNTER — Ambulatory Visit: Payer: Self-pay

## 2022-03-01 DIAGNOSIS — M170 Bilateral primary osteoarthritis of knee: Secondary | ICD-10-CM

## 2022-03-01 DIAGNOSIS — R296 Repeated falls: Secondary | ICD-10-CM

## 2022-03-01 DIAGNOSIS — M80052D Age-related osteoporosis with current pathological fracture, left femur, subsequent encounter for fracture with routine healing: Secondary | ICD-10-CM

## 2022-03-01 DIAGNOSIS — S42475D Nondisplaced transcondylar fracture of left humerus, subsequent encounter for fracture with routine healing: Secondary | ICD-10-CM

## 2022-03-01 DIAGNOSIS — Z8781 Personal history of (healed) traumatic fracture: Secondary | ICD-10-CM

## 2022-03-01 DIAGNOSIS — S72002D Fracture of unspecified part of neck of left femur, subsequent encounter for closed fracture with routine healing: Secondary | ICD-10-CM

## 2022-03-01 DIAGNOSIS — J432 Centrilobular emphysema: Secondary | ICD-10-CM

## 2022-03-01 DIAGNOSIS — M79602 Pain in left arm: Secondary | ICD-10-CM

## 2022-03-01 MED ORDER — OXYCODONE HCL 5 MG PO TABS: 5.0000 mg | ORAL_TABLET | ORAL | 0 refills | Status: DC | PRN

## 2022-03-01 NOTE — Chronic Care Management (AMB) (Deleted)
Chronic Care Management   CCM RN Visit Note  03/01/2022 Name: Pamela Ball MRN: [id number] DOB: May 24, 1946  Subjective: Pamela Ball is a 76 y.o. year old female who is a primary care patient of Smitty Cords, DO. The care management team was consulted for assistance with disease management and care coordination needs.    Engaged with patient by telephone for follow up visit in response to provider referral for case management and/or care coordination services.   Consent to Services:  The patient was given information about Chronic Care Management services, agreed to services, and gave verbal consent prior to initiation of services.  Please see initial visit note for detailed documentation.   Patient agreed to services and verbal consent obtained.   Assessment: Review of patient past medical history, allergies, medications, health status, including review of consultants reports, laboratory and other test data, was performed as part of comprehensive evaluation and provision of chronic care management services.   SDOH (Social Determinants of Health) assessments and interventions performed:    CCM Care Plan  Allergies  Allergen Reactions   Tape Other (See Comments)    Pulls skin off    Outpatient Encounter Medications as of 03/01/2022  Medication Sig   albuterol (VENTOLIN HFA) 108 (90 Base) MCG/ACT inhaler INHALE 2 PUFFS INTO THE LUNGS EVERY 4 HOURS AS NEEDED FOR WHEEZING OR SHORTNESS OF BREATH (COUGH) (Patient taking differently: Inhale 1-2 puffs into the lungs every 4 (four) hours as needed for shortness of breath or wheezing.)   ALPRAZolam (XANAX) 0.5 MG tablet Take 1 tablet (0.5 mg total) by mouth 2 (two) times daily as needed for anxiety.   amLODipine (NORVASC) 10 MG tablet TAKE 1 TABLET BY MOUTH EVERY DAY   Ascorbic Acid (VITAMIN C PO) Take 1 tablet by mouth daily.   aspirin EC 325 MG EC tablet Take 1 tablet (325 mg total) by mouth daily.   Aspirin-Caffeine (BC  FAST PAIN RELIEF PO) Take 1 packet by mouth 4 (four) times daily as needed (pain).   buPROPion (WELLBUTRIN XL) 150 MG 24 hr tablet Take 150 mg by mouth daily.   busPIRone (BUSPAR) 10 MG tablet Take 10 mg by mouth daily. Prescribed by Psychiatry Dr Janeece Riggers   Calcium-Vitamin D-Vitamin K ([employer] CALCIUM SOFT CHEWS PO) Take by mouth.   Cyanocobalamin (B-12 PO) Take 1 capsule by mouth daily.   docusate sodium (COLACE) 100 MG capsule Take 1 capsule (100 mg total) by mouth 2 (two) times daily. (Patient taking differently: Take 100 mg by mouth daily.)   feeding supplement (ENSURE ENLIVE / ENSURE PLUS) LIQD Take 237 mLs by mouth 3 (three) times daily between meals.   furosemide (LASIX) 20 MG tablet Take 1 tablet (20 mg total) by mouth daily as needed for fluid or edema.   Multiple Vitamin (MULTIVITAMIN WITH MINERALS) TABS tablet Take 1 tablet by mouth daily.   Multiple Vitamins-Minerals (PRESERVISION AREDS PO) Take 1 tablet by mouth daily.   mupirocin ointment (BACTROBAN) 2 % Apply 1 application. topically 2 (two) times daily. For 7-10 days   Nutritional Supplements (CARNATION BREAKFAST ESSENTIALS PO) Take 1 Dose by mouth daily.   OLANZapine (ZYPREXA) 7.5 MG tablet Take 7.5 mg by mouth daily.   omeprazole (PRILOSEC) 20 MG capsule TAKE 1 CAPSULE BY MOUTH DAILY BEFORE BREAKFAST.   ondansetron (ZOFRAN-ODT) 4 MG disintegrating tablet TAKE 1 TABLET BY MOUTH EVERY 8 HOURS AS NEEDED FOR NAUSEA AND VOMITING   oxyCODONE (OXY IR/ROXICODONE) 5 MG immediate release tablet Take 1-2  tablets (5-10 mg total) by mouth every 4 (four) hours as needed for moderate pain (pain score 4-6).   simvastatin (ZOCOR) 20 MG tablet Take 1 tablet (20 mg total) by mouth daily.   venlafaxine XR (EFFEXOR-XR) 75 MG 24 hr capsule Take 75 mg by mouth daily with breakfast.    vitamin B-12 1000 MCG tablet Take 3 tablets (3,000 mcg total) by mouth daily.   VITAMIN D PO Take 1 capsule by mouth daily.   No facility-administered encounter medications on  file as of 03/01/2022.    Patient Active Problem List   Diagnosis Date Noted   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 12/23/2021   Venous insufficiency of both lower extremities 12/13/2021   Closed comminuted intra-articular fracture of distal femur, left, initial encounter (HCC) 06/23/2021   Closed fracture of multiple pubic rami, left, initial encounter (HCC) 06/23/2021   Accidental fall    Preoperative clearance    Recurrent falls    Macrocytosis 03/08/2021   Abnormal SPEP 03/08/2021   Rib pain on right side 08/06/2020   Essential hypertension 06/28/2020   Osteopenia of right foot 02/11/2019   Malnutrition of moderate degree 12/27/2018   COPD (chronic obstructive pulmonary disease) (HCC) 12/26/2018   Lumbar hernia 11/19/2018   Elevated troponin 09/26/2018   Incisional hernia, without obstruction or gangrene 08/28/2018   Allergic rhinitis due to allergen 05/14/2018   Abdominal hernia with obstruction and without gangrene    Normocytic anemia 05/23/2017   History of abdominal hernia 05/21/2017   Bipolar 1 disorder, depressed, full remission (HCC) 02/15/2017   GERD (gastroesophageal reflux disease) 02/15/2017   Centrilobular emphysema (HCC) 02/15/2017   Osteoarthritis of knees, 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   Hyperlipidemia 02/15/2017   Presbycusis of both ears 02/15/2017   History of compression fracture of spine 02/15/2017   Anxiety 02/15/2017    Conditions to be addressed/monitored:COPD and Chronic pain and falls  Care Plan : RNCM: General Plan of Care (Adult) for Chronic Disease Management and Care Coordination Needs  Updates made by Marlowe Sax, RN since 03/01/2022 12:00 AM     Problem: RNCM: Development of Plan of Care for Chronic Disease Management (HLD, COPD, Chronic Pain, Fractures from Falls)   Priority: High     Long-Range Goal: RNCM: Development of Plan of Care for Chronic Disease Management (HLD, COPD, Chronic Pain, Fractures from Falls)   Start  Date: 02/09/2022  Expected End Date: 02/10/2023  Priority: High  Note:   Current Barriers:  Knowledge Deficits related to plan of care for management of HLD, COPD, and Chronic pain and fractures from Falls  Care Coordination needs related to Level of care concerns and high utilization   Chronic Disease Management support and education needs related to HLD, COPD, Chronic Pain, and fractures from Falls  RNCM Clinical Goal(s):  Patient will verbalize basic understanding of HLD, COPD, and Chronic pain and fractures from falls disease process and self health management plan as evidenced by keeping appointments, compliance with the plan of care and working with the CCM team to effectively manage health and well being take all medications exactly as prescribed and will call provider for medication related questions as evidenced by compliance with medications and calling for refills when needed     attend all scheduled medical appointments: with pcp and specialist as evidenced by keeping appointments and calling for schedule change needs        demonstrate improved and ongoing adherence to prescribed treatment plan for HLD, COPD, and Chronic pain  and fractures from falls as evidenced by stable conditions, no acute exacerbation, no new falls, and compliance with the plan  of care for chronic conditions. demonstrate ongoing self health care management ability for effective management of chronic conditions as evidenced by working with the CCM team through collaboration with Medical illustrator, provider, and care team.   Interventions: 1:1 collaboration with primary care provider regarding development and update of comprehensive plan of care as evidenced by provider attestation and co-signature Inter-disciplinary care team collaboration (see longitudinal plan of care) Evaluation of current treatment plan related to  self management and patient's adherence to plan as established by provider   COPD: (Status: New  goal. Goal on Track (progressing): YES.) Long Term Goal  Reviewed medications with patient, including use of prescribed maintenance and rescue inhalers, and provided instruction on medication management and the importance of adherence. 03-01-2022: Is compliant with medications.  Provided patient with basic written and verbal COPD education on self care/management/and exacerbation prevention Advised patient to track and manage COPD triggers Provided written and verbal instructions on pursed lip breathing and utilized returned demonstration as teach back Provided instruction about proper use of medications used for management of COPD including inhalers Advised patient to self assesses COPD action plan zone and make appointment with provider if in the yellow zone for 48 hours without improvement Advised patient to engage in light exercise as tolerated 3-5 days a week to aid in the the management of COPD Provided education about and advised patient to utilize infection prevention strategies to reduce risk of respiratory infection Discussed the importance of adequate rest and management of fatigue with COPD Screening for signs and symptoms of depression related to chronic disease state  Assessed social determinant of health barriers  Falls:  (Status: New goal. Goal on Track (progressing): YES.) Long Term Goal  Provided written and verbal education re: potential causes of falls and Fall prevention strategies. 02-09-2022: the patient has had several fractures related to frequent falls. She currently is working with OT and PT and this is helping. A nurse comes in one time a week on Thursdays. The patients husband states she is being careful. Appreciates the support of the CCM team. Encouraged the patient and husband to call with any new concerns or educational needs. Will continue to monitor. Education and support given Reviewed medications and discussed potential side effects of medications such as dizziness  and frequent urination Advised patient of importance of notifying provider of falls. 02-09-2022: The patient and spouse educated on notifying provider of new falls.  Assessed for signs and symptoms of orthostatic hypotension Assessed for falls since last encounter. 03-01-2022: Denies any new falls. The patient is working with PT Assessed patients knowledge of fall risk prevention secondary to previously provided education Provided patient information for fall alert systems Advised patient to discuss fall with provider  Hyperlipidemia:  (Status: New goal. Goal on Track (progressing): YES.) Long Term Goal  Lab Results  Component Value Date   CHOL 215 (H) 06/28/2020   HDL 77 06/28/2020   LDLCALC 114 (H) 06/28/2020   TRIG 125 06/28/2020   CHOLHDL 2.8 06/28/2020     Medication review performed; medication list updated in electronic medical record.  Provider established cholesterol goals reviewed; Counseled on importance of regular laboratory monitoring as prescribed; Provided HLD educational materials; Reviewed role and benefits of statin for ASCVD risk reduction; Discussed strategies to manage statin-induced myalgias; Reviewed importance of limiting foods high in cholesterol;  Pain:  (Status: New goal. Goal  on Track (progressing): YES.) Long Term Goal  Pain assessment performed. 02-09-2022: The patients husband states that the patient deals with a lot of pain due to past fractures and musculoskeletal pain. She denies pain currently but does not sleep well due to pain and discomfort. Education and support given. 02-13-2022: The patients husband states that the patient has pain constantly and rates it at a 7/8. He called the office and ask for the [facility] to call him back. The patients husband is asking assistance with refill needs for pain medications. 03-01-2022: The patients husband called and left a message for the [facility] asking for a call back that the patient was out of pain medications. Call returned  to the patients husband. The patients husband says she is doing therapy and in "a lot of pain" . Secure chat sent to the the pcp and pharm D asking for recommendations. After talking with the pcp the pcp has sent in a refill for medications and also has sent in a referral for pain management specialist at [facility]. Have called the patients husband back and given this information to the patients husband. The husband verbalized understanding and thanked the [facility] for calling him back to let him know.  Medications reviewed. 02-09-2022: Is compliant with medications. 02-13-2022: The patient is taking oxycodone 5 to 10 mg Q 4 hours as needed for pain relief. The patients husband states that she only has 3 pain pills left and was seeking help with refill needs. Will consult with pcp and pharm D for assistance with medication refills. 03-01-2022: The patients husband states the patient is completely out of pain medications. Review of refill from 02-13-2022. The patients husband states that he called and left a message for the [facility] on Monday from a "Tiffany". The message was not received. Education on the role of the RNCM and the patients husband verbalized understanding. Advised the patients husband that the pcp would be contacted to see what recommendations were. The patients husband states that she is in more pain due to receiving therapy. Secure message sent to the pcp and pharm D asking for recommendations. Advised the patients husband that after collaboration with pcp and recommendations would give the patients husband a call back. Call made back to the patients husband and provided recommendations by the provider to the husband. The patients husband states that he has received a text message stating that her medications was ready for pick up. Also instructed the patients husband concerning the referral to the pain management specialist. Will continue to monitor.  Reviewed provider established plan for pain management.  02-13-2022: The patients husband states that she will go to see the surgeon on Wednesday and he is also going to have to talk to the provider about her knee pain and discomfort. Encouraged him to discuss other pain options. The patients husband said the PT is helping her and she is taking this TID a week but it is painful for her. 03-01-2022: Review of possibly needing pain management referral. Will collaborate with the pcp; Discussed importance of adherence to all scheduled medical appointments. 02-13-2022: The patient is seeing surgeon for follow up on her shoulder on 02-15-2022.  Counseled on the importance of reporting any/all new or changed pain symptoms or management strategies to pain management provider. 02-13-2022: Education and support given. ; Advised patient to report to care team affect of pain on daily activities; Discussed use of relaxation techniques and/or diversional activities to assist with pain reduction (distraction, imagery, relaxation, massage, acupressure, TENS,  heat, and cold application; Reviewed with patient prescribed pharmacological and nonpharmacological pain relief strategies; Advised patient to discuss unresolved pain, changes in level or intensity of pain with provider;  Patient Goals/Self-Care Activities: Take medications as prescribed   Attend all scheduled provider appointments Call pharmacy for medication refills 3-7 days in advance of running out of medications Attend church or other social activities Perform all self care activities independently  Perform IADL's (shopping, preparing meals, housekeeping, managing finances) independently Call provider office for new concerns or questions  Work with the social worker to address care coordination needs and will continue to work with the clinical team to address health care and disease management related needs call the Suicide and Crisis Lifeline: 988 call the [place] National Suicide Prevention Lifeline: 910-763-5948 or  TTY: 831-121-3411 TTY (818) 436-3641) to talk to a trained counselor call 1-800-273-TALK (toll free, 24 hour hotline) if experiencing a Mental Health or Behavioral Health Crisis  - call for medicine refill 2 or 3 days before it runs out - take all medications exactly as prescribed - call doctor with any symptoms you believe are related to your medicine - call doctor when you experience any new symptoms - go to all doctor appointments as scheduled - adhere to prescribed diet: heart healthy diet       Plan:Telephone follow up appointment with care management team member scheduled for:  04-20-2022 at 145 pm   Alto Denver RN, MSN, CCM Community Care Coordinator [facility]  [facility] [place] Mobile: [phone]

## 2022-03-01 NOTE — Patient Instructions (Signed)
Visit Information ? ?Thank you for taking time to visit with me today. Please don't hesitate to contact me if I can be of assistance to you before our next scheduled telephone appointment. ? ?Following are the goals we discussed today:  ?RNCM: Voicemail received from the patients husband asking for a call back due to needing refill on the patients pain medications. Chronic Disease Management and Care Coordination Needs  ? ?Our next appointment is by telephone on 04-20-2022 at 145 pm ? ?Please call the care guide team at [phone] if you need to cancel or reschedule your appointment.  ? ?If you are experiencing a Mental Health or [place] or need someone to talk to, please call the Suicide and Crisis Lifeline: 988 ?call the [place] National Suicide Prevention Lifeline: (662) 595-8457 or TTY: (223) 689-5790 TTY 772-528-2552) to talk to a trained counselor ?call 1-800-273-TALK (toll free, 24 hour hotline)  ? ?The patient verbalized understanding of instructions, educational materials, and care plan provided today and declined offer to receive copy of patient instructions, educational materials, and care plan.  ? ?Telephone follow up appointment with care management team member scheduled for: 04-20-2022 at 145 pm ? ?Noreene Larsson RN, MSN, CCM ?Community Care Coordinator ?[place] [facility] ?[facility] ?Mobile: [phone]  ?

## 2022-03-01 NOTE — Chronic Care Management (AMB) (Signed)
? Care Management ?  ? RN Visit Note ? ?03/01/2022 ?Name: Pamela Ball MRN: [id number] DOB: 10-07-1946 ? ?Subjective: ?Pamela Ball is a 76 y.o. year old female who is a primary care patient of Pamela Hauser, DO. The care management team was consulted for assistance with disease management and care coordination needs.   ? ?Engaged with patient by telephone for follow up visit in response to provider referral for case management and/or care coordination services.  ? ?Consent to Services:  ? Pamela Ball was given information about Care Management services today including:  ?Care Management services includes personalized support from designated clinical staff supervised by her physician, including individualized plan of care and coordination with other care providers ?24/7 contact phone numbers for assistance for urgent and routine care needs. ?The patient may stop case management services at any time by phone call to the office staff. ? ?Patient agreed to services and consent obtained.  ? ?Assessment: Review of patient past medical history, allergies, medications, health status, including review of consultants reports, laboratory and other test data, was performed as part of comprehensive evaluation and provision of chronic care management services.  ? ?SDOH (Social Determinants of Health) assessments and interventions performed:   ? ?Care Plan ? ?Allergies  ?Allergen Reactions  ? Tape Other (See Comments)  ?  Pulls skin off  ? ? ?Outpatient Encounter Medications as of 03/01/2022  ?Medication Sig  ? albuterol (VENTOLIN HFA) 108 (90 Base) MCG/ACT inhaler INHALE 2 PUFFS INTO THE LUNGS EVERY 4 HOURS AS NEEDED FOR WHEEZING OR SHORTNESS OF BREATH (COUGH) (Patient taking differently: Inhale 1-2 puffs into the lungs every 4 (four) hours as needed for shortness of breath or wheezing.)  ? ALPRAZolam (XANAX) 0.5 MG tablet Take 1 tablet (0.5 mg total) by mouth 2 (two) times daily as needed for anxiety.  ?  amLODipine (NORVASC) 10 MG tablet TAKE 1 TABLET BY MOUTH EVERY DAY  ? Ascorbic Acid (VITAMIN C PO) Take 1 tablet by mouth daily.  ? aspirin EC 325 MG EC tablet Take 1 tablet (325 mg total) by mouth daily.  ? Aspirin-Caffeine (BC FAST PAIN RELIEF PO) Take 1 packet by mouth 4 (four) times daily as needed (pain).  ? buPROPion (WELLBUTRIN XL) 150 MG 24 hr tablet Take 150 mg by mouth daily.  ? busPIRone (BUSPAR) 10 MG tablet Take 10 mg by mouth daily. Prescribed by Psychiatry Dr Pamela Ball  ? Calcium-Vitamin D-Vitamin K ([facility] CALCIUM SOFT CHEWS PO) Take by mouth.  ? Cyanocobalamin (B-12 PO) Take 1 capsule by mouth daily.  ? docusate sodium (COLACE) 100 MG capsule Take 1 capsule (100 mg total) by mouth 2 (two) times daily. (Patient taking differently: Take 100 mg by mouth daily.)  ? feeding supplement (ENSURE ENLIVE / ENSURE PLUS) LIQD Take 237 mLs by mouth 3 (three) times daily between meals.  ? furosemide (LASIX) 20 MG tablet Take 1 tablet (20 mg total) by mouth daily as needed for fluid or edema.  ? Multiple Vitamin (MULTIVITAMIN WITH MINERALS) TABS tablet Take 1 tablet by mouth daily.  ? Multiple Vitamins-Minerals (PRESERVISION AREDS PO) Take 1 tablet by mouth daily.  ? mupirocin ointment (BACTROBAN) 2 % Apply 1 application. topically 2 (two) times daily. For 7-10 days  ? Nutritional Supplements (CARNATION BREAKFAST ESSENTIALS PO) Take 1 Dose by mouth daily.  ? OLANZapine (ZYPREXA) 7.5 MG tablet Take 7.5 mg by mouth daily.  ? omeprazole (PRILOSEC) 20 MG capsule TAKE 1 CAPSULE BY MOUTH DAILY BEFORE BREAKFAST.  ?  ondansetron (ZOFRAN-ODT) 4 MG disintegrating tablet TAKE 1 TABLET BY MOUTH EVERY 8 HOURS AS NEEDED FOR NAUSEA AND VOMITING  ? oxyCODONE (OXY IR/ROXICODONE) 5 MG immediate release tablet Take 1-2 tablets (5-10 mg total) by mouth every 4 (four) hours as needed for moderate pain (pain score 4-6).  ? simvastatin (ZOCOR) 20 MG tablet Take 1 tablet (20 mg total) by mouth daily.  ? venlafaxine XR (EFFEXOR-XR) 75 MG 24 hr  capsule Take 75 mg by mouth daily with breakfast.   ? vitamin B-12 1000 MCG tablet Take 3 tablets (3,000 mcg total) by mouth daily.  ? VITAMIN D PO Take 1 capsule by mouth daily.  ? ?No facility-administered encounter medications on file as of 03/01/2022.  ? ? ?Patient Active Problem List  ? Diagnosis Date Noted  ? 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 12/23/2021  ? Venous insufficiency of both lower extremities 12/13/2021  ? Closed comminuted intra-articular fracture of distal femur, left, initial encounter ([place]) 06/23/2021  ? Closed fracture of multiple pubic rami, left, initial encounter ([place]) 06/23/2021  ? Accidental fall   ? Preoperative clearance   ? Recurrent falls   ? Macrocytosis 03/08/2021  ? Abnormal SPEP 03/08/2021  ? Rib pain on right side 08/06/2020  ? Essential hypertension 06/28/2020  ? Osteopenia of right foot 02/11/2019  ? Malnutrition of moderate degree 12/27/2018  ? COPD (chronic obstructive pulmonary disease) ([place]) 12/26/2018  ? Lumbar hernia 11/19/2018  ? Elevated troponin 09/26/2018  ? Incisional hernia, without obstruction or gangrene 08/28/2018  ? Allergic rhinitis due to allergen 05/14/2018  ? Abdominal hernia with obstruction and without gangrene   ? Normocytic anemia 05/23/2017  ? History of abdominal hernia 05/21/2017  ? Bipolar 1 disorder, depressed, full remission ([place]) 02/15/2017  ? GERD (gastroesophageal reflux disease) 02/15/2017  ? Centrilobular emphysema ([place]) 02/15/2017  ? Osteoarthritis of knees, 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  ? Hyperlipidemia 02/15/2017  ? Presbycusis of both ears 02/15/2017  ? History of compression fracture of spine 02/15/2017  ? Anxiety 02/15/2017  ? ? ?Conditions to be addressed/monitored: COPD and Chronic pain and falls ? ?Care Plan : RNCM: General Plan of Care (Adult) for Chronic Disease Management and Care Coordination Needs  ?Updates made by Pamela Ingles, RN since 03/01/2022 12:00 AM  ?  ? ?Problem: RNCM: Development of Plan of Care for  Chronic Disease Management (HLD, COPD, Chronic Pain, Fractures from Falls)   ?Priority: High  ?  ? ?Long-Range Goal: RNCM: Development of Plan of Care for Chronic Disease Management (HLD, COPD, Chronic Pain, Fractures from Falls)   ?Start Date: 02/09/2022  ?Expected End Date: 02/10/2023  ?Priority: High  ?Note:   ?Current Barriers:  ?Knowledge Deficits related to plan of care for management of HLD, COPD, and Chronic pain and fractures from Falls  ?Care Coordination needs related to Level of care concerns and high utilization   ?Chronic Disease Management support and education needs related to HLD, COPD, Chronic Pain, and fractures from Falls ? ?RNCM Clinical Goal(s):  ?Patient will verbalize basic understanding of HLD, COPD, and Chronic pain and fractures from falls disease process and self health management plan as evidenced by keeping appointments, compliance with the plan of care and working with the CCM team to effectively manage health and well being ?take all medications exactly as prescribed and will call provider for medication related questions as evidenced by compliance with medications and calling for refills when needed     ?attend all scheduled medical appointments: with pcp and specialist  as evidenced by keeping appointments and calling for schedule change needs        ?demonstrate improved and ongoing adherence to prescribed treatment plan for HLD, COPD, and Chronic pain and fractures from falls as evidenced by stable conditions, no acute exacerbation, no new falls, and compliance with the plan  of care for chronic conditions. ?demonstrate ongoing self health care management ability for effective management of chronic conditions as evidenced by working with the CCM team through collaboration with Consulting civil engineer, provider, and care team.  ? ?Interventions: ?1:1 collaboration with primary care provider regarding development and update of comprehensive plan of care as evidenced by provider attestation and  co-signature ?Inter-disciplinary care team collaboration (see longitudinal plan of care) ?Evaluation of current treatment plan related to  self management and patient's adherence to plan as established by provider ? ? ?C

## 2022-03-07 DIAGNOSIS — D472 Monoclonal gammopathy: Secondary | ICD-10-CM | POA: Diagnosis not present

## 2022-03-07 DIAGNOSIS — R531 Weakness: Secondary | ICD-10-CM | POA: Diagnosis not present

## 2022-03-07 DIAGNOSIS — M80022P Age-related osteoporosis with current pathological fracture, left humerus, subsequent encounter for fracture with malunion: Secondary | ICD-10-CM | POA: Diagnosis not present

## 2022-03-07 DIAGNOSIS — Z7982 Long term (current) use of aspirin: Secondary | ICD-10-CM | POA: Diagnosis not present

## 2022-03-07 DIAGNOSIS — J449 Chronic obstructive pulmonary disease, unspecified: Secondary | ICD-10-CM | POA: Diagnosis not present

## 2022-03-07 DIAGNOSIS — F32A Depression, unspecified: Secondary | ICD-10-CM | POA: Diagnosis not present

## 2022-03-07 DIAGNOSIS — Z9181 History of falling: Secondary | ICD-10-CM | POA: Diagnosis not present

## 2022-03-07 DIAGNOSIS — Z8673 Personal history of transient ischemic attack (TIA), and cerebral infarction without residual deficits: Secondary | ICD-10-CM | POA: Diagnosis not present

## 2022-03-07 DIAGNOSIS — E785 Hyperlipidemia, unspecified: Secondary | ICD-10-CM | POA: Diagnosis not present

## 2022-03-07 DIAGNOSIS — Z96612 Presence of left artificial shoulder joint: Secondary | ICD-10-CM | POA: Diagnosis not present

## 2022-03-07 DIAGNOSIS — D62 Acute posthemorrhagic anemia: Secondary | ICD-10-CM | POA: Diagnosis not present

## 2022-03-07 DIAGNOSIS — I252 Old myocardial infarction: Secondary | ICD-10-CM | POA: Diagnosis not present

## 2022-03-07 DIAGNOSIS — I10 Essential (primary) hypertension: Secondary | ICD-10-CM | POA: Diagnosis not present

## 2022-03-07 DIAGNOSIS — M170 Bilateral primary osteoarthritis of knee: Secondary | ICD-10-CM | POA: Diagnosis not present

## 2022-03-07 DIAGNOSIS — Z79899 Other long term (current) drug therapy: Secondary | ICD-10-CM | POA: Diagnosis not present

## 2022-03-09 ENCOUNTER — Other Ambulatory Visit: Payer: Self-pay | Admitting: Family Medicine

## 2022-03-09 DIAGNOSIS — I872 Venous insufficiency (chronic) (peripheral): Secondary | ICD-10-CM

## 2022-03-09 NOTE — Telephone Encounter (Signed)
Requested medications are due for refill today.  yes ? ?Requested medications are on the active medications list.  yes ? ?Last refill. 12/13/2021 #30 2 refills ? ?Future visit scheduled.   no ? ?Notes to clinic.  Medication refill failed protocol d/t abnormal/ expired labs. ? ? ? ?Requested Prescriptions  ?Pending Prescriptions Disp Refills  ? furosemide (LASIX) 20 MG tablet [Pharmacy Med Name: FUROSEMIDE 20 MG TABLET] 90 tablet   ?  Sig: Take 1 tablet (20 mg total) by mouth daily as needed for fluid or edema.  ?  ? Cardiovascular:  Diuretics - Loop Failed - 03/09/2022  3:04 AM  ?  ?  Failed - Ca in normal range and within 180 days  ?  Calcium  ?Date Value Ref Range Status  ?12/24/2021 7.8 (L) 8.9 - 10.3 mg/dL Final  ? ?Calcium, Total  ?Date Value Ref Range Status  ?02/08/2015 9.2 mg/dL Final  ?  Comment:  ?  8.9-10.3 ?NOTE: New Reference Range ? 01/12/15 ?  ?  ?  ?  ?  Failed - Mg Level in normal range and within 180 days  ?  Magnesium  ?Date Value Ref Range Status  ?08/07/2019 2.1 1.7 - 2.4 mg/dL Final  ?  Comment:  ?  Performed at [facility], [place] [address], [place], [place] [address]  ?  ?  ?  ?  Failed - Last BP in normal range  ?  BP Readings from Last 1 Encounters:  ?01/16/22 (!) 101/48  ?  ?  ?  ?  Passed - K in normal range and within 180 days  ?  Potassium  ?Date Value Ref Range Status  ?12/24/2021 3.9 3.5 - 5.1 mmol/L Final  ?02/08/2015 3.7 mmol/L Final  ?  Comment:  ?  3.5-5.1 ?NOTE: New Reference Range ? 01/12/15 ?  ?  ?  ?  ?  Passed - Na in normal range and within 180 days  ?  Sodium  ?Date Value Ref Range Status  ?12/24/2021 137 135 - 145 mmol/L Final  ?02/08/2015 138 mmol/L Final  ?  Comment:  ?  135-145 ?NOTE: New Reference Range ? 01/12/15 ?  ?  ?  ?  ?  Passed - Cr in normal range and within 180 days  ?  Creat  ?Date Value Ref Range Status  ?06/28/2020 0.59 (L) 0.60 - 0.93 mg/dL Final  ?  Comment:  ?  For patients >44 years of age, the reference limit ?for Creatinine is approximately 13%  higher for people ?identified as African-American. ?. ?  ? ?Creatinine, Ser  ?Date Value Ref Range Status  ?12/24/2021 0.72 0.44 - 1.00 mg/dL Final  ?  ?  ?  ?  Passed - Cl in normal range and within 180 days  ?  Chloride  ?Date Value Ref Range Status  ?12/24/2021 107 98 - 111 mmol/L Final  ?02/08/2015 101 mmol/L Final  ?  Comment:  ?  101-111 ?NOTE: New Reference Range ? 01/12/15 ?  ?  ?  ?  ?  Passed - Valid encounter within last 6 months  ?  Recent Outpatient Visits   ? ?      ? 1 month ago Left arm pain  ? [place], DO  ? 2 months ago Venous insufficiency of both lower extremities  ? [place], DO  ? 4 months ago Chronic left shoulder pain  ? [place], NP  ? 5 months ago Pubic lice  ? [place], DO  ? 6 months ago Closed fracture of left hip with routine healing, subsequent encounter  ? [place], DO  ? ?  ?  ? ? ?  ?  ?  ?  ?

## 2022-03-10 ENCOUNTER — Telehealth: Payer: Self-pay

## 2022-03-10 NOTE — Telephone Encounter (Signed)
Patients husband came by the office wanting to talk to [facility] about a pain referral.  Pain referral was placed on 03/01/22.  Patients husband was given phone number and all information.   ? ?Patients husband is requesting a call back.  ?

## 2022-03-14 ENCOUNTER — Other Ambulatory Visit: Payer: Self-pay | Admitting: Family Medicine

## 2022-03-14 DIAGNOSIS — M170 Bilateral primary osteoarthritis of knee: Secondary | ICD-10-CM | POA: Diagnosis not present

## 2022-03-14 DIAGNOSIS — Z7982 Long term (current) use of aspirin: Secondary | ICD-10-CM | POA: Diagnosis not present

## 2022-03-14 DIAGNOSIS — I10 Essential (primary) hypertension: Secondary | ICD-10-CM | POA: Diagnosis not present

## 2022-03-14 DIAGNOSIS — J449 Chronic obstructive pulmonary disease, unspecified: Secondary | ICD-10-CM | POA: Diagnosis not present

## 2022-03-14 DIAGNOSIS — Z9181 History of falling: Secondary | ICD-10-CM | POA: Diagnosis not present

## 2022-03-14 DIAGNOSIS — S42475D Nondisplaced transcondylar fracture of left humerus, subsequent encounter for fracture with routine healing: Secondary | ICD-10-CM

## 2022-03-14 DIAGNOSIS — F32A Depression, unspecified: Secondary | ICD-10-CM | POA: Diagnosis not present

## 2022-03-14 DIAGNOSIS — E785 Hyperlipidemia, unspecified: Secondary | ICD-10-CM | POA: Diagnosis not present

## 2022-03-14 DIAGNOSIS — R531 Weakness: Secondary | ICD-10-CM | POA: Diagnosis not present

## 2022-03-14 DIAGNOSIS — Z96612 Presence of left artificial shoulder joint: Secondary | ICD-10-CM | POA: Diagnosis not present

## 2022-03-14 DIAGNOSIS — Z79899 Other long term (current) drug therapy: Secondary | ICD-10-CM | POA: Diagnosis not present

## 2022-03-14 DIAGNOSIS — M80022P Age-related osteoporosis with current pathological fracture, left humerus, subsequent encounter for fracture with malunion: Secondary | ICD-10-CM | POA: Diagnosis not present

## 2022-03-14 DIAGNOSIS — D472 Monoclonal gammopathy: Secondary | ICD-10-CM | POA: Diagnosis not present

## 2022-03-14 DIAGNOSIS — D62 Acute posthemorrhagic anemia: Secondary | ICD-10-CM | POA: Diagnosis not present

## 2022-03-14 DIAGNOSIS — I252 Old myocardial infarction: Secondary | ICD-10-CM | POA: Diagnosis not present

## 2022-03-14 DIAGNOSIS — Z8673 Personal history of transient ischemic attack (TIA), and cerebral infarction without residual deficits: Secondary | ICD-10-CM | POA: Diagnosis not present

## 2022-03-14 NOTE — Telephone Encounter (Signed)
Medication Refill - Medication: oxyCODONE (OXY IR/ROXICODONE) 5 MG immediate release tablet. Patient has 3 pills left ? ?Has the patient contacted their pharmacy? Yes.   ? ?Preferred Pharmacy (with phone number or street name):  ?[id number] [place] [facility] [address] Phone:  [phone] ?Fax:  [phone] ?  ? ? ?Has the patient been seen for an appointment in the last year OR does the patient have an upcoming appointment? Yes.   ? ?Agent: Please be advised that RX refills may take up to 3 business days. We ask that you follow-up with your pharmacy. ?

## 2022-03-15 MED ORDER — OXYCODONE HCL 5 MG PO TABS: 5.0000 mg | ORAL_TABLET | ORAL | 0 refills | Status: DC | PRN

## 2022-03-15 NOTE — Telephone Encounter (Signed)
Requested medication (s) are due for refill today: yes ? ?Requested medication (s) are on the active medication list: yes ? ?Last refill:  03/01/22 #30/0 ? ?Future visit scheduled: no ? ?Notes to clinic:  Unable to refill per protocol, cannot delegate. Pt states she only has 3 pills left.  ? ? ?  ?Requested Prescriptions  ?Pending Prescriptions Disp Refills  ? oxyCODONE (OXY IR/ROXICODONE) 5 MG immediate release tablet 30 tablet 0  ?  Sig: Take 1-2 tablets (5-10 mg total) by mouth every 4 (four) hours as needed for moderate pain (pain score 4-6).  ?  ? Not Delegated - Analgesics:  Opioid Agonists Failed - 03/15/2022 10:06 AM  ?  ?  Failed - This refill cannot be delegated  ?  ?  Failed - Urine Drug Screen completed in last 360 days  ?  ?  Passed - Valid encounter within last 3 months  ?  Recent Outpatient Visits   ? ?      ? 1 month ago Left arm pain  ? [place], DO  ? 3 months ago Venous insufficiency of both lower extremities  ? [place], DO  ? 4 months ago Chronic left shoulder pain  ? [facility] [place], NP  ? 5 months ago Pubic lice  ? [place], DO  ? 6 months ago Closed fracture of left hip with routine healing, subsequent encounter  ? [place], DO  ? ?  ?  ? ? ?  ?  ?  ? ?

## 2022-03-17 ENCOUNTER — Other Ambulatory Visit (INDEPENDENT_AMBULATORY_CARE_PROVIDER_SITE_OTHER): Payer: Medicare Other | Admitting: Family Medicine

## 2022-03-17 DIAGNOSIS — M800AXD Age-related osteoporosis with current pathological fracture, other site, subsequent encounter for fracture with routine healing: Secondary | ICD-10-CM

## 2022-03-17 DIAGNOSIS — I10 Essential (primary) hypertension: Secondary | ICD-10-CM

## 2022-03-17 DIAGNOSIS — S72002D Fracture of unspecified part of neck of left femur, subsequent encounter for closed fracture with routine healing: Secondary | ICD-10-CM

## 2022-03-17 DIAGNOSIS — F3176 Bipolar disorder, in full remission, most recent episode depressed: Secondary | ICD-10-CM

## 2022-03-17 DIAGNOSIS — M80022D Age-related osteoporosis with current pathological fracture, left humerus, subsequent encounter for fracture with routine healing: Secondary | ICD-10-CM

## 2022-03-17 DIAGNOSIS — S42475D Nondisplaced transcondylar fracture of left humerus, subsequent encounter for fracture with routine healing: Secondary | ICD-10-CM

## 2022-03-17 NOTE — Progress Notes (Signed)
Received home health orders orders from [facility]. ?Start of care 08/11/21.   Certification and orders from 12/09/21 through 02/06/22 are reviewed, signed and faxed back to home health company. ? ?Need of intermittent skilled services at home: yes ? ?The home health care plan has been established by me and will be reviewed and updated as needed to maximize patient recovery.  I certify that all home health services have been and will be furnished to the patient while under my care. ? ?Face-to-face encounter in which the need for home health services was established: 12/13/21 ? ?Patient is receiving home health services for the following diagnoses: ?Problem List Items Addressed This Visit   ? ? Bipolar 1 disorder, depressed, full remission ([place])  ? ?Other Visit Diagnoses   ? ? 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 Closed nondisplaced transcondylar fracture of left humerus with routine healing, subsequent encounter      ? Closed fracture of left hip with routine healing, subsequent encounter      ? Age-related osteoporosis with current pathological fracture, other site, subsequent encounter for fracture with routine healing      ? Benign hypertension      ? ?  ? ? ? ?[name], DO  ? ?

## 2022-03-27 ENCOUNTER — Other Ambulatory Visit: Payer: Self-pay | Admitting: Family Medicine

## 2022-03-27 DIAGNOSIS — S42292P Other displaced fracture of upper end of left humerus, subsequent encounter for fracture with malunion: Secondary | ICD-10-CM | POA: Diagnosis not present

## 2022-03-27 DIAGNOSIS — S42475D Nondisplaced transcondylar fracture of left humerus, subsequent encounter for fracture with routine healing: Secondary | ICD-10-CM

## 2022-03-27 DIAGNOSIS — Z96611 Presence of right artificial shoulder joint: Secondary | ICD-10-CM | POA: Diagnosis not present

## 2022-03-27 NOTE — Telephone Encounter (Signed)
Medication Refill - Medication: oxyCODONE (OXY IR/ROXICODONE) 5 MG immediate release tablet  / pt needs refill until she can get in with pain clinic Dr. Raliegh Ip referred her to/ they are booked up for a while  Has the patient contacted their pharmacy? No. (Agent: If no, request that the patient contact the pharmacy for the refill. If patient does not wish to contact the pharmacy document the reason why and proceed with request.) (Agent: If yes, when and what did the pharmacy advise?)  Preferred Pharmacy (with phone number or street name): [id number] [address] [address]  [address] [id number] Phone:  [phone] Fax:  [phone] Has the patient been seen for an appointment in the last year OR does the patient have an upcoming appointment? Yes.    Agent: Please be advised that RX refills may take up to 3 business days. We ask that you follow-up with your pharmacy.

## 2022-03-28 MED ORDER — OXYCODONE HCL 5 MG PO TABS: 5.0000 mg | ORAL_TABLET | ORAL | 0 refills | Status: DC | PRN

## 2022-03-28 NOTE — Telephone Encounter (Signed)
Requested medication (s) are due for refill today: yes  Requested medication (s) are on the active medication list: yes  Last refill:  03/15/22 #30/0  Future visit scheduled: no  Notes to clinic:  rx is not delegated. Pt is requesting refill since it will be a while before she gets in at pain clinic.      Requested Prescriptions  Pending Prescriptions Disp Refills   oxyCODONE (OXY IR/ROXICODONE) 5 MG immediate release tablet 30 tablet 0    Sig: Take 1-2 tablets (5-10 mg total) by mouth every 4 (four) hours as needed for moderate pain (pain score 4-6).     Not Delegated - Analgesics:  Opioid Agonists Failed - 03/27/2022 10:28 AM      Failed - This refill cannot be delegated      Failed - Urine Drug Screen completed in last 360 days      Passed - Valid encounter within last 3 months    Recent Outpatient Visits           2 months ago Left arm pain   [place], DO   3 months ago Venous insufficiency of both lower extremities   [place], DO   4 months ago Chronic left shoulder pain   [facility] [place], Coralie Keens, NP   5 months ago Pubic lice   [place], DO   6 months ago Closed fracture of left hip with routine healing, subsequent encounter   [place], DO

## 2022-04-10 ENCOUNTER — Telehealth: Payer: Self-pay | Admitting: Family Medicine

## 2022-04-10 DIAGNOSIS — S42475D Nondisplaced transcondylar fracture of left humerus, subsequent encounter for fracture with routine healing: Secondary | ICD-10-CM

## 2022-04-10 MED ORDER — OXYCODONE HCL 5 MG PO TABS: 5.0000 mg | ORAL_TABLET | ORAL | 0 refills | Status: DC | PRN

## 2022-04-10 NOTE — Telephone Encounter (Signed)
Medication Refill - Medication:  oxyCODONE (OXY IR/ROXICODONE) 5 MG immediate release tablet 30 tablet 0 03/28/2022    Sig - Route: Take 1-2 tablets (5-10 mg total) by mouth every 4 (four) hours as needed for moderate pain (pain score 4-6). - Oral   Sent to pharmacy as: oxyCODONE (OXY IR/ROXICODONE) 5 MG immediate release tablet   Earliest Fill Date: 03/28/2022    Pt is in a lot of pain knee and shoulder states [name], spouse.  Request Please make urgent, pain clinic is running a month behind! Please alert Dr Raliegh Ip.   Has the patient contacted their pharmacy? Yes.   (Agent: If no, request that the patient contact the pharmacy for the refill. If patient does not wish to contact the pharmacy document the reason why and proceed with request.) (Agent: If yes, when and what did the pharmacy advise?)call dr  Preferred Pharmacy (with phone number or street name):  [id number] [place] [address]  [address] Phone: [phone]: [phone]  Has the patient been seen for an appointment in the last year OR does the patient have an upcoming appointment? Yes.    Agent: Please be advised that RX refills may take up to 3 business days. We ask that you follow-up with your pharmacy.

## 2022-04-11 MED ORDER — HYDROCODONE-ACETAMINOPHEN 10-325 MG PO TABS: 0.5000 | ORAL_TABLET | Freq: Four times a day (QID) | ORAL | 0 refills | Status: DC | PRN

## 2022-04-11 NOTE — Addendum Note (Signed)
Addended by: Olin Hauser on: 04/11/2022 05:30 PM   Modules accepted: Orders

## 2022-04-11 NOTE — Telephone Encounter (Signed)
Please notify patient's husband that I called pharmacy they have no version of Oxycodone available.  I talked with pharmacist and switched it to hydrocodone 10/'325mg'$   She can take HALF tab instead of whole tab for dosing for now to see if works if needed can take whole tab.  Will try to re order in future when it is back in stock.  [name], [facility] 04/11/2022, 5:30 PM

## 2022-04-11 NOTE — Telephone Encounter (Signed)
Pts husband called back in stating the pharmacy on still on back hold, and wondering what PCP wants her to do, or if she needs to try another medication, please advise.

## 2022-04-12 ENCOUNTER — Telehealth: Payer: Self-pay | Admitting: Family Medicine

## 2022-04-12 NOTE — Telephone Encounter (Signed)
Okay to proceed  [name], [facility] 04/12/2022, 4:39 PM

## 2022-04-12 NOTE — Telephone Encounter (Signed)
Home Health Verbal Orders - Caller/Agency: [facility] Number: [phone] Requesting OT/PT/Skilled Nursing/Social Work/Speech Therapy: OT/PT  Dru Amalia Hailey is with [facility]

## 2022-04-14 NOTE — Telephone Encounter (Signed)
Secure message left on VM letting her know of orders to proceed per Dr. Raliegh Ip.

## 2022-04-18 ENCOUNTER — Other Ambulatory Visit: Payer: Self-pay | Admitting: Family Medicine

## 2022-04-18 DIAGNOSIS — J432 Centrilobular emphysema: Secondary | ICD-10-CM

## 2022-04-18 DIAGNOSIS — J441 Chronic obstructive pulmonary disease with (acute) exacerbation: Secondary | ICD-10-CM

## 2022-04-19 NOTE — Telephone Encounter (Signed)
Requested Prescriptions  Pending Prescriptions Disp Refills  . albuterol (VENTOLIN HFA) 108 (90 Base) MCG/ACT inhaler [Pharmacy Med Name: ALBUTEROL HFA (PROAIR) INHALER] 8.5 each 1    Sig: INHALE 2 PUFFS INTO THE LUNGS EVERY 4 HOURS AS NEEDED FOR WHEEZING OR SHORTNESS OF BREATH (COUGH).     Pulmonology:  Beta Agonists 2 Failed - 04/18/2022  4:07 PM      Failed - Last BP in normal range    BP Readings from Last 1 Encounters:  01/16/22 (!) 101/48         Passed - Last Heart Rate in normal range    Pulse Readings from Last 1 Encounters:  01/16/22 99         Passed - Valid encounter within last 12 months    Recent Outpatient Visits          3 months ago Left arm pain   [place], DO   4 months ago Venous insufficiency of both lower extremities   [place], DO   5 months ago Chronic left shoulder pain   [facility] [place], Coralie Keens, NP   6 months ago Pubic lice   [place], DO   7 months ago Closed fracture of left hip with routine healing, subsequent encounter   [place], DO

## 2022-04-20 ENCOUNTER — Ambulatory Visit (INDEPENDENT_AMBULATORY_CARE_PROVIDER_SITE_OTHER): Payer: Medicare Other

## 2022-04-20 ENCOUNTER — Telehealth: Payer: Medicare Other

## 2022-04-20 DIAGNOSIS — Z8781 Personal history of (healed) traumatic fracture: Secondary | ICD-10-CM

## 2022-04-20 DIAGNOSIS — E782 Mixed hyperlipidemia: Secondary | ICD-10-CM

## 2022-04-20 DIAGNOSIS — M800AXD Age-related osteoporosis with current pathological fracture, other site, subsequent encounter for fracture with routine healing: Secondary | ICD-10-CM

## 2022-04-20 DIAGNOSIS — S72002D Fracture of unspecified part of neck of left femur, subsequent encounter for closed fracture with routine healing: Secondary | ICD-10-CM

## 2022-04-20 DIAGNOSIS — M170 Bilateral primary osteoarthritis of knee: Secondary | ICD-10-CM

## 2022-04-20 DIAGNOSIS — I10 Essential (primary) hypertension: Secondary | ICD-10-CM

## 2022-04-20 DIAGNOSIS — M79602 Pain in left arm: Secondary | ICD-10-CM

## 2022-04-20 DIAGNOSIS — R296 Repeated falls: Secondary | ICD-10-CM

## 2022-04-20 DIAGNOSIS — J432 Centrilobular emphysema: Secondary | ICD-10-CM

## 2022-04-20 DIAGNOSIS — M80022D Age-related osteoporosis with current pathological fracture, left humerus, subsequent encounter for fracture with routine healing: Secondary | ICD-10-CM

## 2022-04-20 DIAGNOSIS — M25552 Pain in left hip: Secondary | ICD-10-CM

## 2022-04-20 NOTE — Patient Instructions (Signed)
Visit Information  Thank you for taking time to visit with me today. Please don't hesitate to contact me if I can be of assistance to you before our next scheduled telephone appointment.  Following are the goals we discussed today:  COPD: (Status: Goal on Track (progressing): YES.) Long Term Goal  Reviewed medications with patient, including use of prescribed maintenance and rescue inhalers, and provided instruction on medication management and the importance of adherence. 04-20-2022: Is compliant with medications.  Provided patient with basic written and verbal COPD education on self care/management/and exacerbation prevention. 04-20-2022: Reviewed with the patients husband factors that cause exacerbations. Education and support given.  Advised patient to track and manage COPD triggers. 04-20-2022: Review of triggers that can cause exacerbation of conditions.  Provided written and verbal instructions on pursed lip breathing and utilized returned demonstration as teach back Provided instruction about proper use of medications used for management of COPD including inhalers Advised patient to self assesses COPD action plan zone and make appointment with provider if in the yellow zone for 48 hours without improvement Advised patient to engage in light exercise as tolerated 3-5 days a week to aid in the the management of COPD Provided education about and advised patient to utilize infection prevention strategies to reduce risk of respiratory infection. 04-20-2022: Review of factors that can cause risk of infection or compromise the patient. The patients husband is very involved in the care of the patient and is mindful of things that put the patient at increased risk of infection or COPD exacerbation.  Discussed the importance of adequate rest and management of fatigue with COPD. 04-20-2022: The patient is working with PT/OT again starting Monday. The patient takes rest breaks and knows her limitations. Will  continue to monitor for changes.  Screening for signs and symptoms of depression related to chronic disease state  Assessed social determinant of health barriers   Falls:  (Status: Goal on Track (progressing): YES.) Long Term Goal  Provided written and verbal education re: potential causes of falls and Fall prevention strategies. 02-09-2022: the patient has had several fractures related to frequent falls. She currently is working with OT and PT and this is helping. A nurse comes in one time a week on Thursdays. The patients husband states she is being careful. Appreciates the support of the CCM team. Encouraged the patient and husband to call with any new concerns or educational needs. Will continue to monitor. Education and support given. 04-20-2022: The patient will start back working with OT and PT on Monday the 19th. The patients husband states he feels it really helps her to have PT and OT. She was using her left arm yesterday and that is a positive. She is left handed and that is the arm she injured. The patients husband states that she was not using it. Denies any new falls at this time.  Reviewed medications and discussed potential side effects of medications such as dizziness and frequent urination. 04-20-2022: Review of education needs Advised patient of importance of notifying provider of falls. 02-09-2022: The patient and spouse educated on notifying provider of new falls. 04-20-2022: Reminded the patients husband to call the office for new falls the patient has. Assessed for signs and symptoms of orthostatic hypotension Assessed for falls since last encounter. 04-20-2022: Denies any new falls. The patient is working with PT/OT again Assessed patients knowledge of fall risk prevention secondary to previously provided education Provided patient information for fall alert systems Advised patient to discuss fall with  provider   Hyperlipidemia:  (Status: Goal on Track (progressing): YES.) Long Term  Goal       Lab Results  Component Value Date    CHOL 215 (H) 06/28/2020    HDL 77 06/28/2020    LDLCALC 114 (H) 06/28/2020    TRIG 125 06/28/2020    CHOLHDL 2.8 06/28/2020      Medication review performed; medication list updated in electronic medical record. 04-20-2022: The patient is compliant with Zocor 20 mg QD Provider established cholesterol goals reviewed; Counseled on importance of regular laboratory monitoring as prescribed. 04-20-2022: Review of the need to have regular lab testing. The patient needs new labs at next provider visit Provided HLD educational materials; Reviewed role and benefits of statin for ASCVD risk reduction; Discussed strategies to manage statin-induced myalgias; Reviewed importance of limiting foods high in cholesterol;   Pain:  (Status: New goal. Goal on Track (progressing): YES.) Long Term Goal  Pain assessment performed. 02-09-2022: The patients husband states that the patient deals with a lot of pain due to past fractures and musculoskeletal pain. She denies pain currently but does not sleep well due to pain and discomfort. Education and support given. 02-13-2022: The patients husband states that the patient has pain constantly and rates it at a 7/8. He called the office and ask for the [facility] to call him back. The patients husband is asking assistance with refill needs for pain medications. 03-01-2022: The patients husband called and left a message for the [facility] asking for a call back that the patient was out of pain medications. Call returned to the patients husband. The patients husband says she is doing therapy and in "a lot of pain" . Secure chat sent to the the pcp and pharm D asking for recommendations. After talking with the pcp the pcp has sent in a refill for medications and also has sent in a referral for pain management specialist at [facility]. Have called the patients husband back and given this information to the patients husband. The husband verbalized  understanding and thanked the [facility] for calling him back to let him know. 04-20-2022: The patients husband states that the patient is still in a lot of pain. The patients husband manages her medications and monitors pain. The patients husband is still waiting to hear from pain specialist on referral the pcp placed. Education and support given.  Medications reviewed. 02-09-2022: Is compliant with medications. 02-13-2022: The patient is taking oxycodone 5 to 10 mg Q 4 hours as needed for pain relief. The patients husband states that she only has 3 pain pills left and was seeking help with refill needs. Will consult with pcp and pharm D for assistance with medication refills. 03-01-2022: The patients husband states the patient is completely out of pain medications. Review of refill from 02-13-2022. The patients husband states that he called and left a message for the [facility] on Monday from a "Tiffany". The message was not received. Education on the role of the RNCM and the patients husband verbalized understanding. Advised the patients husband that the pcp would be contacted to see what recommendations were. The patients husband states that she is in more pain due to receiving therapy. Secure message sent to the pcp and pharm D asking for recommendations. Advised the patients husband that after collaboration with pcp and recommendations would give the patients husband a call back. Call made back to the patients husband and provided recommendations by the provider to the husband. The patients husband states that he  has received a text message stating that her medications was ready for pick up. Also instructed the patients husband concerning the referral to the pain management specialist. Will continue to monitor. 04-20-2022: The patients husband states the pharmacy was out of her usual pain medications and they had to prescribe her something differently. He is giving this to her as directed. The patients husband states that he  monitors closely. She had a bad night last night but she is having a better day.  Reviewed provider established plan for pain management. 02-13-2022: The patients husband states that she will go to see the surgeon on Wednesday and he is also going to have to talk to the provider about her knee pain and discomfort. Encouraged him to discuss other pain options. The patients husband said the PT is helping her and she is taking this TID a week but it is painful for her. 03-01-2022: Review of possibly needing pain management referral. Will collaborate with the pcp. 04-20-2022: The patient has had a referral in place for pain provider but the patients husband states he has not heard back from them. He called and they told him they were about a month behind. The patients husband states he hopes they can see a pain specialist to help her manage her pain and discomfort better.; Discussed importance of adherence to all scheduled medical appointments. Sees pcp when needed. Review of following up with pain specialist when can get an appointment Counseled on the importance of reporting any/all new or changed pain symptoms or management strategies to pain management provider. 04-20-2022: Education and support given. ; Advised patient to report to care team affect of pain on daily activities; Discussed use of relaxation techniques and/or diversional activities to assist with pain reduction (distraction, imagery, relaxation, massage, acupressure, TENS, heat, and cold application; Reviewed with patient prescribed pharmacological and nonpharmacological pain relief strategies; Advised patient to discuss unresolved pain, changes in level or intensity of pain with provider;    Our next appointment is by telephone on 06/29/2022 at 145 pm  Please call the care guide team at [phone] if you need to cancel or reschedule your appointment.   If you are experiencing a Mental Health or [place] or need someone to  talk to, please call the Suicide and Crisis Lifeline: 988 call the [place] National Suicide Prevention Lifeline: (434)200-1787 or TTY: 418-475-9977 TTY 320-312-4732) to talk to a trained counselor call 1-800-273-TALK (toll free, 24 hour hotline)   The patient verbalized understanding of instructions, educational materials, and care plan provided today and DECLINED offer to receive copy of patient instructions, educational materials, and care plan.   Noreene Larsson RN, MSN, [place] Mobile: [phone]

## 2022-04-20 NOTE — Chronic Care Management (AMB) (Signed)
Chronic Care Management   CCM RN Visit Note  04/20/2022 Name: Pamela Ball MRN: [id number] DOB: 02/22/1946  Subjective: Pamela Ball is a 76 y.o. year old female who is a primary care patient of Olin Hauser, DO. The care management team was consulted for assistance with disease management and care coordination needs.    Engaged with patient by telephone for follow up visit in response to provider referral for case management and/or care coordination services. Spoke to the patient husband, Pamela Ball  Consent to Services:  The patient was given information about Chronic Care Management services, agreed to services, and gave verbal consent prior to initiation of services.  Please see initial visit note for detailed documentation.   Patient agreed to services and verbal consent obtained.   Assessment: Review of patient past medical history, allergies, medications, health status, including review of consultants reports, laboratory and other test data, was performed as part of comprehensive evaluation and provision of chronic care management services.   SDOH (Social Determinants of Health) assessments and interventions performed:    CCM Care Plan  Allergies  Allergen Reactions   Tape Other (See Comments)    Pulls skin off    Outpatient Encounter Medications as of 04/20/2022  Medication Sig   albuterol (VENTOLIN HFA) 108 (90 Base) MCG/ACT inhaler INHALE 2 PUFFS INTO THE LUNGS EVERY 4 HOURS AS NEEDED FOR WHEEZING OR SHORTNESS OF BREATH (COUGH).   ALPRAZolam (XANAX) 0.5 MG tablet Take 1 tablet (0.5 mg total) by mouth 2 (two) times daily as needed for anxiety.   amLODipine (NORVASC) 10 MG tablet TAKE 1 TABLET BY MOUTH EVERY DAY   Ascorbic Acid (VITAMIN C PO) Take 1 tablet by mouth daily.   aspirin EC 325 MG EC tablet Take 1 tablet (325 mg total) by mouth daily.   Aspirin-Caffeine (BC FAST PAIN RELIEF PO) Take 1 packet by mouth 4 (four) times daily as needed (pain).    buPROPion (WELLBUTRIN XL) 150 MG 24 hr tablet Take 150 mg by mouth daily.   busPIRone (BUSPAR) 10 MG tablet Take 10 mg by mouth daily. Prescribed by Psychiatry Dr Kasandra Knudsen   Calcium-Vitamin D-Vitamin K ([employer] CALCIUM SOFT CHEWS PO) Take by mouth.   Cyanocobalamin (B-12 PO) Take 1 capsule by mouth daily.   docusate sodium (COLACE) 100 MG capsule Take 1 capsule (100 mg total) by mouth 2 (two) times daily. (Patient taking differently: Take 100 mg by mouth daily.)   feeding supplement (ENSURE ENLIVE / ENSURE PLUS) LIQD Take 237 mLs by mouth 3 (three) times daily between meals.   furosemide (LASIX) 20 MG tablet TAKE 1 TABLET (20 MG TOTAL) BY MOUTH DAILY AS NEEDED FOR FLUID OR EDEMA.   HYDROcodone-acetaminophen (NORCO) 10-325 MG tablet Take 0.5-1 tablets by mouth every 6 (six) hours as needed.   Multiple Vitamin (MULTIVITAMIN WITH MINERALS) TABS tablet Take 1 tablet by mouth daily.   Multiple Vitamins-Minerals (PRESERVISION AREDS PO) Take 1 tablet by mouth daily.   mupirocin ointment (BACTROBAN) 2 % Apply 1 application. topically 2 (two) times daily. For 7-10 days   Nutritional Supplements (CARNATION BREAKFAST ESSENTIALS PO) Take 1 Dose by mouth daily.   OLANZapine (ZYPREXA) 7.5 MG tablet Take 7.5 mg by mouth daily.   omeprazole (PRILOSEC) 20 MG capsule TAKE 1 CAPSULE BY MOUTH DAILY BEFORE BREAKFAST.   ondansetron (ZOFRAN-ODT) 4 MG disintegrating tablet TAKE 1 TABLET BY MOUTH EVERY 8 HOURS AS NEEDED FOR NAUSEA AND VOMITING   simvastatin (ZOCOR) 20 MG tablet Take 1 tablet (  20 mg total) by mouth daily.   venlafaxine XR (EFFEXOR-XR) 75 MG 24 hr capsule Take 75 mg by mouth daily with breakfast.    vitamin B-12 1000 MCG tablet Take 3 tablets (3,000 mcg total) by mouth daily.   VITAMIN D PO Take 1 capsule by mouth daily.   No facility-administered encounter medications on file as of 04/20/2022.    Patient Active Problem List   Diagnosis Date Noted   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  12/23/2021   Venous insufficiency of both lower extremities 12/13/2021   Closed comminuted intra-articular fracture of distal femur, left, initial encounter ([place]) 06/23/2021   Closed fracture of multiple pubic rami, left, initial encounter ([place]) 06/23/2021   Accidental fall    Preoperative clearance    Recurrent falls    Macrocytosis 03/08/2021   Abnormal SPEP 03/08/2021   Rib pain on right side 08/06/2020   Essential hypertension 06/28/2020   Osteopenia of right foot 02/11/2019   Malnutrition of moderate degree 12/27/2018   COPD (chronic obstructive pulmonary disease) ([place]) 12/26/2018   Lumbar hernia 11/19/2018   Elevated troponin 09/26/2018   Incisional hernia, without obstruction or gangrene 08/28/2018   Allergic rhinitis due to allergen 05/14/2018   Abdominal hernia with obstruction and without gangrene    Normocytic anemia 05/23/2017   History of abdominal hernia 05/21/2017   Bipolar 1 disorder, depressed, full remission ([place]) 02/15/2017   GERD (gastroesophageal reflux disease) 02/15/2017   Centrilobular emphysema ([place]) 02/15/2017   Osteoarthritis of knees, 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   Hyperlipidemia 02/15/2017   Presbycusis of both ears 02/15/2017   History of compression fracture of spine 02/15/2017   Anxiety 02/15/2017    Conditions to be addressed/monitored:HLD, COPD, and Chronic pain, fractures from falls  Care Plan : RNCM: General Plan of Care (Adult) for Chronic Disease Management and Care Coordination Needs  Updates made by Vanita Ingles, RN since 04/20/2022 12:00 AM     Problem: RNCM: Development of Plan of Care for Chronic Disease Management (HLD, COPD, Chronic Pain, Fractures from Falls)   Priority: High     Long-Range Goal: RNCM: Development of Plan of Care for Chronic Disease Management (HLD, COPD, Chronic Pain, Fractures from Falls)   Start Date: 02/09/2022  Expected End Date: 02/10/2023  Priority: High  Note:   Current Barriers:  Knowledge Deficits related to  plan of care for management of HLD, COPD, and Chronic pain and fractures from [place] needs related to Level of care concerns and high utilization   Chronic Disease Management support and education needs related to HLD, COPD, Chronic Pain, and fractures from [place]):  Patient will verbalize basic understanding of HLD, COPD, and Chronic pain and fractures from falls disease process and self health management plan as evidenced by keeping appointments, compliance with the plan of care and working with the CCM team to effectively manage health and well being take all medications exactly as prescribed and will call provider for medication related questions as evidenced by compliance with medications and calling for refills when needed     attend all scheduled medical appointments: with pcp and specialist as evidenced by keeping appointments and calling for schedule change needs        demonstrate improved and ongoing adherence to prescribed treatment plan for HLD, COPD, and Chronic pain and fractures from falls as evidenced by stable conditions, no acute exacerbation, no new falls, and compliance with the plan  of care for chronic conditions.  demonstrate ongoing self health care management ability for effective management of chronic conditions as evidenced by working with the CCM team through collaboration with Consulting civil engineer, provider, and care team.   Interventions: 1:1 collaboration with primary care provider regarding development and update of comprehensive plan of care as evidenced by provider attestation and co-signature Inter-disciplinary care team collaboration (see longitudinal plan of care) Evaluation of current treatment plan related to  self management and patient's adherence to plan as established by provider   COPD: (Status: Goal on Track (progressing): YES.) Long Term Goal  Reviewed medications with patient, including use of prescribed maintenance  and rescue inhalers, and provided instruction on medication management and the importance of adherence. 04-20-2022: Is compliant with medications.  Provided patient with basic written and verbal COPD education on self care/management/and exacerbation prevention. 04-20-2022: Reviewed with the patients husband factors that cause exacerbations. Education and support given.  Advised patient to track and manage COPD triggers. 04-20-2022: Review of triggers that can cause exacerbation of conditions.  Provided written and verbal instructions on pursed lip breathing and utilized returned demonstration as teach back Provided instruction about proper use of medications used for management of COPD including inhalers Advised patient to self assesses COPD action plan zone and make appointment with provider if in the yellow zone for 48 hours without improvement Advised patient to engage in light exercise as tolerated 3-5 days a week to aid in the the management of COPD Provided education about and advised patient to utilize infection prevention strategies to reduce risk of respiratory infection. 04-20-2022: Review of factors that can cause risk of infection or compromise the patient. The patients husband is very involved in the care of the patient and is mindful of things that put the patient at increased risk of infection or COPD exacerbation.  Discussed the importance of adequate rest and management of fatigue with COPD. 04-20-2022: The patient is working with PT/OT again starting Monday. The patient takes rest breaks and knows her limitations. Will continue to monitor for changes.  Screening for signs and symptoms of depression related to chronic disease state  Assessed social determinant of health barriers  Falls:  (Status: Goal on Track (progressing): YES.) Long Term Goal  Provided written and verbal education re: potential causes of falls and Fall prevention strategies. 02-09-2022: the patient has had several  fractures related to frequent falls. She currently is working with OT and PT and this is helping. A nurse comes in one time a week on Thursdays. The patients husband states she is being careful. Appreciates the support of the CCM team. Encouraged the patient and husband to call with any new concerns or educational needs. Will continue to monitor. Education and support given. 04-20-2022: The patient will start back working with OT and PT on Monday the 19th. The patients husband states he feels it really helps her to have PT and OT. She was using her left arm yesterday and that is a positive. She is left handed and that is the arm she injured. The patients husband states that she was not using it. Denies any new falls at this time.  Reviewed medications and discussed potential side effects of medications such as dizziness and frequent urination. 04-20-2022: Review of education needs Advised patient of importance of notifying provider of falls. 02-09-2022: The patient and spouse educated on notifying provider of new falls. 04-20-2022: Reminded the patients husband to call the office for new falls the patient has. Assessed for signs and symptoms of orthostatic  hypotension Assessed for falls since last encounter. 04-20-2022: Denies any new falls. The patient is working with PT/OT again Assessed patients knowledge of fall risk prevention secondary to previously provided education Provided patient information for fall alert systems Advised patient to discuss fall with provider  Hyperlipidemia:  (Status: Goal on Track (progressing): YES.) Long Term Goal  Lab Results  Component Value Date   CHOL 215 (H) 06/28/2020   HDL 77 06/28/2020   LDLCALC 114 (H) 06/28/2020   TRIG 125 06/28/2020   CHOLHDL 2.8 06/28/2020     Medication review performed; medication list updated in electronic medical record. 04-20-2022: The patient is compliant with Zocor 20 mg QD Provider established cholesterol goals reviewed; Counseled on  importance of regular laboratory monitoring as prescribed. 04-20-2022: Review of the need to have regular lab testing. The patient needs new labs at next provider visit Provided HLD educational materials; Reviewed role and benefits of statin for ASCVD risk reduction; Discussed strategies to manage statin-induced myalgias; Reviewed importance of limiting foods high in cholesterol;  Pain:  (Status: New goal. Goal on Track (progressing): YES.) Long Term Goal  Pain assessment performed. 02-09-2022: The patients husband states that the patient deals with a lot of pain due to past fractures and musculoskeletal pain. She denies pain currently but does not sleep well due to pain and discomfort. Education and support given. 02-13-2022: The patients husband states that the patient has pain constantly and rates it at a 7/8. He called the office and ask for the [facility] to call him back. The patients husband is asking assistance with refill needs for pain medications. 03-01-2022: The patients husband called and left a message for the [facility] asking for a call back that the patient was out of pain medications. Call returned to the patients husband. The patients husband says she is doing therapy and in "a lot of pain" . Secure chat sent to the the pcp and pharm D asking for recommendations. After talking with the pcp the pcp has sent in a refill for medications and also has sent in a referral for pain management specialist at [facility]. Have called the patients husband back and given this information to the patients husband. The husband verbalized understanding and thanked the [facility] for calling him back to let him know. 04-20-2022: The patients husband states that the patient is still in a lot of pain. The patients husband manages her medications and monitors pain. The patients husband is still waiting to hear from pain specialist on referral the pcp placed. Education and support given.  Medications reviewed. 02-09-2022: Is compliant with  medications. 02-13-2022: The patient is taking oxycodone 5 to 10 mg Q 4 hours as needed for pain relief. The patients husband states that she only has 3 pain pills left and was seeking help with refill needs. Will consult with pcp and pharm D for assistance with medication refills. 03-01-2022: The patients husband states the patient is completely out of pain medications. Review of refill from 02-13-2022. The patients husband states that he called and left a message for the [facility] on Monday from a "T[place]". The message was not received. Education on the role of the RNCM and the patients husband verbalized understanding. Advised the patients husband that the pcp would be contacted to see what recommendations were. The patients husband states that she is in more pain due to receiving therapy. Secure message sent to the pcp and pharm D asking for recommendations. Advised the patients husband that after collaboration with pcp and  recommendations would give the patients husband a call back. Call made back to the patients husband and provided recommendations by the provider to the husband. The patients husband states that he has received a text message stating that her medications was ready for pick up. Also instructed the patients husband concerning the referral to the pain management specialist. Will continue to monitor. 04-20-2022: The patients husband states the pharmacy was out of her usual pain medications and they had to prescribe her something differently. He is giving this to her as directed. The patients husband states that he monitors closely. She had a bad night last night but she is having a better day.  Reviewed provider established plan for pain management. 02-13-2022: The patients husband states that she will go to see the surgeon on Wednesday and he is also going to have to talk to the provider about her knee pain and discomfort. Encouraged him to discuss other pain options. The patients husband said the PT is  helping her and she is taking this TID a week but it is painful for her. 03-01-2022: Review of possibly needing pain management referral. Will collaborate with the pcp. 04-20-2022: The patient has had a referral in place for pain provider but the patients husband states he has not heard back from them. He called and they told him they were about a month behind. The patients husband states he hopes they can see a pain specialist to help her manage her pain and discomfort better.; Discussed importance of adherence to all scheduled medical appointments. Sees pcp when needed. Review of following up with pain specialist when can get an appointment Counseled on the importance of reporting any/all new or changed pain symptoms or management strategies to pain management provider. 04-20-2022: Education and support given. ; Advised patient to report to care team affect of pain on daily activities; Discussed use of relaxation techniques and/or diversional activities to assist with pain reduction (distraction, imagery, relaxation, massage, acupressure, TENS, heat, and cold application; Reviewed with patient prescribed pharmacological and nonpharmacological pain relief strategies; Advised patient to discuss unresolved pain, changes in level or intensity of pain with provider;  Patient Goals/Self-Care Activities: Take medications as prescribed   Attend all scheduled provider appointments Call pharmacy for medication refills 3-7 days in advance of running out of medications Attend church or other social activities Perform all self care activities independently  Perform IADL's (shopping, preparing meals, housekeeping, managing finances) independently Call provider office for new concerns or questions  Work with the social worker to address care coordination needs and will continue to work with the clinical team to address health care and disease management related needs call the Suicide and Crisis Lifeline: 988 call  the [place] National Suicide Prevention Lifeline: 405-229-0597 or TTY: (956)375-0869 TTY 639-356-8641) to talk to a trained counselor call 1-800-273-TALK (toll free, 24 hour hotline) if experiencing a Mental Health or [place]  - call for medicine refill 2 or 3 days before it runs out - take all medications exactly as prescribed - call doctor with any symptoms you believe are related to your medicine - call doctor when you experience any new symptoms - go to all doctor appointments as scheduled - adhere to prescribed diet: heart healthy diet       Plan:Telephone follow up appointment with care management team member scheduled for:  06-29-2022 at 145 pm  Maribel, MSN, [place] Mobile: [phone]

## 2022-04-20 NOTE — Chronic Care Management (AMB) (Deleted)
Chronic Care Management   CCM RN Visit Note  04/20/2022 Name: Pamela Ball MRN: [id number] DOB: 07-16-1946  Subjective: Pamela Ball is a 76 y.o. year old female who is a primary care patient of Olin Hauser, DO. The care management team was consulted for assistance with disease management and care coordination needs.    Engaged with patient by telephone for follow up visit in response to provider referral for case management and/or care coordination services.   Consent to Services:  The patient was given information about Chronic Care Management services, agreed to services, and gave verbal consent prior to initiation of services.  Please see initial visit note for detailed documentation.   Patient agreed to services and verbal consent obtained.   Assessment: Review of patient past medical history, allergies, medications, health status, including review of consultants reports, laboratory and other test data, was performed as part of comprehensive evaluation and provision of chronic care management services.   SDOH (Social Determinants of Health) assessments and interventions performed:    CCM Care Plan  Allergies  Allergen Reactions   Tape Other (See Comments)    Pulls skin off    Outpatient Encounter Medications as of 04/20/2022  Medication Sig   albuterol (VENTOLIN HFA) 108 (90 Base) MCG/ACT inhaler INHALE 2 PUFFS INTO THE LUNGS EVERY 4 HOURS AS NEEDED FOR WHEEZING OR SHORTNESS OF BREATH (COUGH).   ALPRAZolam (XANAX) 0.5 MG tablet Take 1 tablet (0.5 mg total) by mouth 2 (two) times daily as needed for anxiety.   amLODipine (NORVASC) 10 MG tablet TAKE 1 TABLET BY MOUTH EVERY DAY   Ascorbic Acid (VITAMIN C PO) Take 1 tablet by mouth daily.   aspirin EC 325 MG EC tablet Take 1 tablet (325 mg total) by mouth daily.   Aspirin-Caffeine (BC FAST PAIN RELIEF PO) Take 1 packet by mouth 4 (four) times daily as needed (pain).   buPROPion (WELLBUTRIN XL) 150 MG 24 hr  tablet Take 150 mg by mouth daily.   busPIRone (BUSPAR) 10 MG tablet Take 10 mg by mouth daily. Prescribed by Psychiatry Dr Kasandra Knudsen   Calcium-Vitamin D-Vitamin K ([facility] CALCIUM SOFT CHEWS PO) Take by mouth.   Cyanocobalamin (B-12 PO) Take 1 capsule by mouth daily.   docusate sodium (COLACE) 100 MG capsule Take 1 capsule (100 mg total) by mouth 2 (two) times daily. (Patient taking differently: Take 100 mg by mouth daily.)   feeding supplement (ENSURE ENLIVE / ENSURE PLUS) LIQD Take 237 mLs by mouth 3 (three) times daily between meals.   furosemide (LASIX) 20 MG tablet TAKE 1 TABLET (20 MG TOTAL) BY MOUTH DAILY AS NEEDED FOR FLUID OR EDEMA.   HYDROcodone-acetaminophen (NORCO) 10-325 MG tablet Take 0.5-1 tablets by mouth every 6 (six) hours as needed.   Multiple Vitamin (MULTIVITAMIN WITH MINERALS) TABS tablet Take 1 tablet by mouth daily.   Multiple Vitamins-Minerals (PRESERVISION AREDS PO) Take 1 tablet by mouth daily.   mupirocin ointment (BACTROBAN) 2 % Apply 1 application. topically 2 (two) times daily. For 7-10 days   Nutritional Supplements (CARNATION BREAKFAST ESSENTIALS PO) Take 1 Dose by mouth daily.   OLANZapine (ZYPREXA) 7.5 MG tablet Take 7.5 mg by mouth daily.   omeprazole (PRILOSEC) 20 MG capsule TAKE 1 CAPSULE BY MOUTH DAILY BEFORE BREAKFAST.   ondansetron (ZOFRAN-ODT) 4 MG disintegrating tablet TAKE 1 TABLET BY MOUTH EVERY 8 HOURS AS NEEDED FOR NAUSEA AND VOMITING   simvastatin (ZOCOR) 20 MG tablet Take 1 tablet (20 mg total) by mouth  daily.   venlafaxine XR (EFFEXOR-XR) 75 MG 24 hr capsule Take 75 mg by mouth daily with breakfast.    vitamin B-12 1000 MCG tablet Take 3 tablets (3,000 mcg total) by mouth daily.   VITAMIN D PO Take 1 capsule by mouth daily.   No facility-administered encounter medications on file as of 04/20/2022.    Patient Active Problem List   Diagnosis Date Noted   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 12/23/2021   Venous insufficiency of both  lower extremities 12/13/2021   Closed comminuted intra-articular fracture of distal femur, left, initial encounter ([place]) 06/23/2021   Closed fracture of multiple pubic rami, left, initial encounter ([place]) 06/23/2021   Accidental fall    Preoperative clearance    Recurrent falls    Macrocytosis 03/08/2021   Abnormal SPEP 03/08/2021   Rib pain on right side 08/06/2020   Essential hypertension 06/28/2020   Osteopenia of right foot 02/11/2019   Malnutrition of moderate degree 12/27/2018   COPD (chronic obstructive pulmonary disease) ([place]) 12/26/2018   Lumbar hernia 11/19/2018   Elevated troponin 09/26/2018   Incisional hernia, without obstruction or gangrene 08/28/2018   Allergic rhinitis due to allergen 05/14/2018   Abdominal hernia with obstruction and without gangrene    Normocytic anemia 05/23/2017   History of abdominal hernia 05/21/2017   Bipolar 1 disorder, depressed, full remission ([place]) 02/15/2017   GERD (gastroesophageal reflux disease) 02/15/2017   Centrilobular emphysema ([place]) 02/15/2017   Osteoarthritis of knees, 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   Hyperlipidemia 02/15/2017   Presbycusis of both ears 02/15/2017   History of compression fracture of spine 02/15/2017   Anxiety 02/15/2017    Conditions to be addressed/monitored:HLD, COPD, and Chronic pain, falls  Care Plan : RNCM: General Plan of Care (Adult) for Chronic Disease Management and Care Coordination Needs  Updates made by Vanita Ingles, RN since 04/20/2022 12:00 AM     Problem: RNCM: Development of Plan of Care for Chronic Disease Management (HLD, COPD, Chronic Pain, Fractures from Falls)   Priority: High     Long-Range Goal: RNCM: Development of Plan of Care for Chronic Disease Management (HLD, COPD, Chronic Pain, Fractures from Falls)   Start Date: 02/09/2022  Expected End Date: 02/10/2023  Priority: High  Note:   Current Barriers:  Knowledge Deficits related to plan of care for management of HLD, COPD, and Chronic pain  and fractures from [place] needs related to Level of care concerns and high utilization   Chronic Disease Management support and education needs related to HLD, COPD, Chronic Pain, and fractures from [place]):  Patient will verbalize basic understanding of HLD, COPD, and Chronic pain and fractures from falls disease process and self health management plan as evidenced by keeping appointments, compliance with the plan of care and working with the CCM team to effectively manage health and well being take all medications exactly as prescribed and will call provider for medication related questions as evidenced by compliance with medications and calling for refills when needed     attend all scheduled medical appointments: with pcp and specialist as evidenced by keeping appointments and calling for schedule change needs        demonstrate improved and ongoing adherence to prescribed treatment plan for HLD, COPD, and Chronic pain and fractures from falls as evidenced by stable conditions, no acute exacerbation, no new falls, and compliance with the plan  of care for chronic conditions. demonstrate ongoing self health care management ability  for effective management of chronic conditions as evidenced by working with the CCM team through collaboration with Consulting civil engineer, provider, and care team.   Interventions: 1:1 collaboration with primary care provider regarding development and update of comprehensive plan of care as evidenced by provider attestation and co-signature Inter-disciplinary care team collaboration (see longitudinal plan of care) Evaluation of current treatment plan related to  self management and patient's adherence to plan as established by provider   COPD: (Status: Goal on Track (progressing): YES.) Long Term Goal  Reviewed medications with patient, including use of prescribed maintenance and rescue inhalers, and provided instruction on medication  management and the importance of adherence. 04-20-2022: Is compliant with medications.  Provided patient with basic written and verbal COPD education on self care/management/and exacerbation prevention. 04-20-2022: Reviewed with the patients husband factors that cause exacerbations. Education and support given.  Advised patient to track and manage COPD triggers. 04-20-2022: Review of triggers that can cause exacerbation of conditions.  Provided written and verbal instructions on pursed lip breathing and utilized returned demonstration as teach back Provided instruction about proper use of medications used for management of COPD including inhalers Advised patient to self assesses COPD action plan zone and make appointment with provider if in the yellow zone for 48 hours without improvement Advised patient to engage in light exercise as tolerated 3-5 days a week to aid in the the management of COPD Provided education about and advised patient to utilize infection prevention strategies to reduce risk of respiratory infection. 04-20-2022: Review of factors that can cause risk of infection or compromise the patient. The patients husband is very involved in the care of the patient and is mindful of things that put the patient at increased risk of infection or COPD exacerbation.  Discussed the importance of adequate rest and management of fatigue with COPD. 04-20-2022: The patient is working with PT/OT again starting Monday. The patient takes rest breaks and knows her limitations. Will continue to monitor for changes.  Screening for signs and symptoms of depression related to chronic disease state  Assessed social determinant of health barriers  Falls:  (Status: Goal on Track (progressing): YES.) Long Term Goal  Provided written and verbal education re: potential causes of falls and Fall prevention strategies. 02-09-2022: the patient has had several fractures related to frequent falls. She currently is working with  OT and PT and this is helping. A nurse comes in one time a week on Thursdays. The patients husband states she is being careful. Appreciates the support of the CCM team. Encouraged the patient and husband to call with any new concerns or educational needs. Will continue to monitor. Education and support given. 04-20-2022: The patient will start back working with OT and PT on Monday the 19th. The patients husband states he feels it really helps her to have PT and OT. She was using her left arm yesterday and that is a positive. She is left handed and that is the arm she injured. The patients husband states that she was not using it. Denies any new falls at this time.  Reviewed medications and discussed potential side effects of medications such as dizziness and frequent urination. 04-20-2022: Review of education needs Advised patient of importance of notifying provider of falls. 02-09-2022: The patient and spouse educated on notifying provider of new falls. 04-20-2022: Reminded the patients husband to call the office for new falls the patient has. Assessed for signs and symptoms of orthostatic hypotension Assessed for falls since last encounter.  04-20-2022: Denies any new falls. The patient is working with PT/OT again Assessed patients knowledge of fall risk prevention secondary to previously provided education Provided patient information for fall alert systems Advised patient to discuss fall with provider  Hyperlipidemia:  (Status: Goal on Track (progressing): YES.) Long Term Goal  Lab Results  Component Value Date   CHOL 215 (H) 06/28/2020   HDL 77 06/28/2020   LDLCALC 114 (H) 06/28/2020   TRIG 125 06/28/2020   CHOLHDL 2.8 06/28/2020     Medication review performed; medication list updated in electronic medical record. 04-20-2022: The patient is compliant with Zocor 20 mg QD Provider established cholesterol goals reviewed; Counseled on importance of regular laboratory monitoring as prescribed.  04-20-2022: Review of the need to have regular lab testing. The patient needs new labs at next provider visit Provided HLD educational materials; Reviewed role and benefits of statin for ASCVD risk reduction; Discussed strategies to manage statin-induced myalgias; Reviewed importance of limiting foods high in cholesterol;  Pain:  (Status: New goal. Goal on Track (progressing): YES.) Long Term Goal  Pain assessment performed. 02-09-2022: The patients husband states that the patient deals with a lot of pain due to past fractures and musculoskeletal pain. She denies pain currently but does not sleep well due to pain and discomfort. Education and support given. 02-13-2022: The patients husband states that the patient has pain constantly and rates it at a 7/8. He called the office and ask for the [facility] to call him back. The patients husband is asking assistance with refill needs for pain medications. 03-01-2022: The patients husband called and left a message for the [facility] asking for a call back that the patient was out of pain medications. Call returned to the patients husband. The patients husband says she is doing therapy and in "a lot of pain" . Secure chat sent to the the pcp and pharm D asking for recommendations. After talking with the pcp the pcp has sent in a refill for medications and also has sent in a referral for pain management specialist at [facility]. Have called the patients husband back and given this information to the patients husband. The husband verbalized understanding and thanked the [facility] for calling him back to let him know. 04-20-2022: The patients husband states that the patient is still in a lot of pain. The patients husband manages her medications and monitors pain. The patients husband is still waiting to hear from pain specialist on referral the pcp placed. Education and support given.  Medications reviewed. 02-09-2022: Is compliant with medications. 02-13-2022: The patient is taking oxycodone 5 to  10 mg Q 4 hours as needed for pain relief. The patients husband states that she only has 3 pain pills left and was seeking help with refill needs. Will consult with pcp and pharm D for assistance with medication refills. 03-01-2022: The patients husband states the patient is completely out of pain medications. Review of refill from 02-13-2022. The patients husband states that he called and left a message for the [facility] on Monday from a "Tiffany". The message was not received. Education on the role of the RNCM and the patients husband verbalized understanding. Advised the patients husband that the pcp would be contacted to see what recommendations were. The patients husband states that she is in more pain due to receiving therapy. Secure message sent to the pcp and pharm D asking for recommendations. Advised the patients husband that after collaboration with pcp and recommendations would give the patients husband a  call back. Call made back to the patients husband and provided recommendations by the provider to the husband. The patients husband states that he has received a text message stating that her medications was ready for pick up. Also instructed the patients husband concerning the referral to the pain management specialist. Will continue to monitor. 04-20-2022: The patients husband states the pharmacy was out of her usual pain medications and they had to prescribe her something differently. He is giving this to her as directed. The patients husband states that he monitors closely. She had a bad night last night but she is having a better day.  Reviewed provider established plan for pain management. 02-13-2022: The patients husband states that she will go to see the surgeon on Wednesday and he is also going to have to talk to the provider about her knee pain and discomfort. Encouraged him to discuss other pain options. The patients husband said the PT is helping her and she is taking this TID a week but it is  painful for her. 03-01-2022: Review of possibly needing pain management referral. Will collaborate with the pcp. 04-20-2022: The patient has had a referral in place for pain provider but the patients husband states he has not heard back from them. He called and they told him they were about a month behind. The patients husband states he hopes they can see a pain specialist to help her manage her pain and discomfort better.; Discussed importance of adherence to all scheduled medical appointments. Sees pcp when needed. Review of following up with pain specialist when can get an appointment Counseled on the importance of reporting any/all new or changed pain symptoms or management strategies to pain management provider. 04-20-2022: Education and support given. ; Advised patient to report to care team affect of pain on daily activities; Discussed use of relaxation techniques and/or diversional activities to assist with pain reduction (distraction, imagery, relaxation, massage, acupressure, TENS, heat, and cold application; Reviewed with patient prescribed pharmacological and nonpharmacological pain relief strategies; Advised patient to discuss unresolved pain, changes in level or intensity of pain with provider;  Patient Goals/Self-Care Activities: Take medications as prescribed   Attend all scheduled provider appointments Call pharmacy for medication refills 3-7 days in advance of running out of medications Attend church or other social activities Perform all self care activities independently  Perform IADL's (shopping, preparing meals, housekeeping, managing finances) independently Call provider office for new concerns or questions  Work with the social worker to address care coordination needs and will continue to work with the clinical team to address health care and disease management related needs call the Suicide and Crisis Lifeline: 988 call the [place] National Suicide Prevention Lifeline:  214-124-2486 or TTY: 548 282 2693 TTY (332)150-4201) to talk to a trained counselor call 1-800-273-TALK (toll free, 24 hour hotline) if experiencing a Mental Health or [place]  - call for medicine refill 2 or 3 days before it runs out - take all medications exactly as prescribed - call doctor with any symptoms you believe are related to your medicine - call doctor when you experience any new symptoms - go to all doctor appointments as scheduled - adhere to prescribed diet: heart healthy diet       Plan:Telephone follow up appointment with care management team member scheduled for:  06-29-2022 at 145 pm  [place], MSN, [place] Mobile: [phone]

## 2022-04-24 DIAGNOSIS — Z79891 Long term (current) use of opiate analgesic: Secondary | ICD-10-CM | POA: Diagnosis not present

## 2022-04-24 DIAGNOSIS — M80052S Age-related osteoporosis with current pathological fracture, left femur, sequela: Secondary | ICD-10-CM | POA: Diagnosis not present

## 2022-04-24 DIAGNOSIS — J432 Centrilobular emphysema: Secondary | ICD-10-CM | POA: Diagnosis not present

## 2022-04-24 DIAGNOSIS — Z79899 Other long term (current) drug therapy: Secondary | ICD-10-CM | POA: Diagnosis not present

## 2022-04-24 DIAGNOSIS — I10 Essential (primary) hypertension: Secondary | ICD-10-CM | POA: Diagnosis not present

## 2022-04-24 DIAGNOSIS — D472 Monoclonal gammopathy: Secondary | ICD-10-CM | POA: Diagnosis not present

## 2022-04-24 DIAGNOSIS — W19XXXD Unspecified fall, subsequent encounter: Secondary | ICD-10-CM | POA: Diagnosis not present

## 2022-04-24 DIAGNOSIS — Z8673 Personal history of transient ischemic attack (TIA), and cerebral infarction without residual deficits: Secondary | ICD-10-CM | POA: Diagnosis not present

## 2022-04-24 DIAGNOSIS — M1712 Unilateral primary osteoarthritis, left knee: Secondary | ICD-10-CM | POA: Diagnosis not present

## 2022-04-24 DIAGNOSIS — Z96612 Presence of left artificial shoulder joint: Secondary | ICD-10-CM | POA: Diagnosis not present

## 2022-04-24 DIAGNOSIS — Z7982 Long term (current) use of aspirin: Secondary | ICD-10-CM | POA: Diagnosis not present

## 2022-04-24 DIAGNOSIS — I252 Old myocardial infarction: Secondary | ICD-10-CM | POA: Diagnosis not present

## 2022-04-24 DIAGNOSIS — Z87310 Personal history of (healed) osteoporosis fracture: Secondary | ICD-10-CM | POA: Diagnosis not present

## 2022-04-24 DIAGNOSIS — K219 Gastro-esophageal reflux disease without esophagitis: Secondary | ICD-10-CM | POA: Diagnosis not present

## 2022-04-24 DIAGNOSIS — T8484XD Pain due to internal orthopedic prosthetic devices, implants and grafts, subsequent encounter: Secondary | ICD-10-CM | POA: Diagnosis not present

## 2022-04-24 DIAGNOSIS — M1652 Unilateral post-traumatic osteoarthritis, left hip: Secondary | ICD-10-CM | POA: Diagnosis not present

## 2022-04-24 DIAGNOSIS — E785 Hyperlipidemia, unspecified: Secondary | ICD-10-CM | POA: Diagnosis not present

## 2022-04-25 ENCOUNTER — Other Ambulatory Visit: Payer: Self-pay | Admitting: Family Medicine

## 2022-04-25 DIAGNOSIS — S42475D Nondisplaced transcondylar fracture of left humerus, subsequent encounter for fracture with routine healing: Secondary | ICD-10-CM

## 2022-04-25 MED ORDER — HYDROCODONE-ACETAMINOPHEN 10-325 MG PO TABS: 0.5000 | ORAL_TABLET | Freq: Four times a day (QID) | ORAL | 0 refills | Status: DC | PRN

## 2022-05-01 DIAGNOSIS — I10 Essential (primary) hypertension: Secondary | ICD-10-CM | POA: Diagnosis not present

## 2022-05-01 DIAGNOSIS — E785 Hyperlipidemia, unspecified: Secondary | ICD-10-CM | POA: Diagnosis not present

## 2022-05-01 DIAGNOSIS — Z7982 Long term (current) use of aspirin: Secondary | ICD-10-CM | POA: Diagnosis not present

## 2022-05-01 DIAGNOSIS — I252 Old myocardial infarction: Secondary | ICD-10-CM | POA: Diagnosis not present

## 2022-05-01 DIAGNOSIS — Z87310 Personal history of (healed) osteoporosis fracture: Secondary | ICD-10-CM | POA: Diagnosis not present

## 2022-05-01 DIAGNOSIS — Z8673 Personal history of transient ischemic attack (TIA), and cerebral infarction without residual deficits: Secondary | ICD-10-CM | POA: Diagnosis not present

## 2022-05-01 DIAGNOSIS — Z79891 Long term (current) use of opiate analgesic: Secondary | ICD-10-CM | POA: Diagnosis not present

## 2022-05-01 DIAGNOSIS — Z96612 Presence of left artificial shoulder joint: Secondary | ICD-10-CM | POA: Diagnosis not present

## 2022-05-01 DIAGNOSIS — M1712 Unilateral primary osteoarthritis, left knee: Secondary | ICD-10-CM | POA: Diagnosis not present

## 2022-05-01 DIAGNOSIS — D472 Monoclonal gammopathy: Secondary | ICD-10-CM | POA: Diagnosis not present

## 2022-05-01 DIAGNOSIS — M80052S Age-related osteoporosis with current pathological fracture, left femur, sequela: Secondary | ICD-10-CM | POA: Diagnosis not present

## 2022-05-01 DIAGNOSIS — T8484XD Pain due to internal orthopedic prosthetic devices, implants and grafts, subsequent encounter: Secondary | ICD-10-CM | POA: Diagnosis not present

## 2022-05-01 DIAGNOSIS — K219 Gastro-esophageal reflux disease without esophagitis: Secondary | ICD-10-CM | POA: Diagnosis not present

## 2022-05-01 DIAGNOSIS — J432 Centrilobular emphysema: Secondary | ICD-10-CM | POA: Diagnosis not present

## 2022-05-01 DIAGNOSIS — M1652 Unilateral post-traumatic osteoarthritis, left hip: Secondary | ICD-10-CM | POA: Diagnosis not present

## 2022-05-01 DIAGNOSIS — W19XXXD Unspecified fall, subsequent encounter: Secondary | ICD-10-CM | POA: Diagnosis not present

## 2022-05-01 DIAGNOSIS — Z79899 Other long term (current) drug therapy: Secondary | ICD-10-CM | POA: Diagnosis not present

## 2022-05-03 DIAGNOSIS — M80052S Age-related osteoporosis with current pathological fracture, left femur, sequela: Secondary | ICD-10-CM | POA: Diagnosis not present

## 2022-05-03 DIAGNOSIS — M1712 Unilateral primary osteoarthritis, left knee: Secondary | ICD-10-CM | POA: Diagnosis not present

## 2022-05-03 DIAGNOSIS — M1652 Unilateral post-traumatic osteoarthritis, left hip: Secondary | ICD-10-CM | POA: Diagnosis not present

## 2022-05-03 DIAGNOSIS — I10 Essential (primary) hypertension: Secondary | ICD-10-CM | POA: Diagnosis not present

## 2022-05-03 DIAGNOSIS — W19XXXD Unspecified fall, subsequent encounter: Secondary | ICD-10-CM | POA: Diagnosis not present

## 2022-05-03 DIAGNOSIS — Z8673 Personal history of transient ischemic attack (TIA), and cerebral infarction without residual deficits: Secondary | ICD-10-CM | POA: Diagnosis not present

## 2022-05-03 DIAGNOSIS — Z96612 Presence of left artificial shoulder joint: Secondary | ICD-10-CM | POA: Diagnosis not present

## 2022-05-03 DIAGNOSIS — E785 Hyperlipidemia, unspecified: Secondary | ICD-10-CM | POA: Diagnosis not present

## 2022-05-03 DIAGNOSIS — Z79899 Other long term (current) drug therapy: Secondary | ICD-10-CM | POA: Diagnosis not present

## 2022-05-03 DIAGNOSIS — K219 Gastro-esophageal reflux disease without esophagitis: Secondary | ICD-10-CM | POA: Diagnosis not present

## 2022-05-03 DIAGNOSIS — Z7982 Long term (current) use of aspirin: Secondary | ICD-10-CM | POA: Diagnosis not present

## 2022-05-03 DIAGNOSIS — I252 Old myocardial infarction: Secondary | ICD-10-CM | POA: Diagnosis not present

## 2022-05-03 DIAGNOSIS — Z79891 Long term (current) use of opiate analgesic: Secondary | ICD-10-CM | POA: Diagnosis not present

## 2022-05-03 DIAGNOSIS — J432 Centrilobular emphysema: Secondary | ICD-10-CM | POA: Diagnosis not present

## 2022-05-03 DIAGNOSIS — Z87310 Personal history of (healed) osteoporosis fracture: Secondary | ICD-10-CM | POA: Diagnosis not present

## 2022-05-03 DIAGNOSIS — D472 Monoclonal gammopathy: Secondary | ICD-10-CM | POA: Diagnosis not present

## 2022-05-03 DIAGNOSIS — T8484XD Pain due to internal orthopedic prosthetic devices, implants and grafts, subsequent encounter: Secondary | ICD-10-CM | POA: Diagnosis not present

## 2022-05-04 DIAGNOSIS — W19XXXD Unspecified fall, subsequent encounter: Secondary | ICD-10-CM | POA: Diagnosis not present

## 2022-05-04 DIAGNOSIS — M1712 Unilateral primary osteoarthritis, left knee: Secondary | ICD-10-CM | POA: Diagnosis not present

## 2022-05-04 DIAGNOSIS — Z8673 Personal history of transient ischemic attack (TIA), and cerebral infarction without residual deficits: Secondary | ICD-10-CM | POA: Diagnosis not present

## 2022-05-04 DIAGNOSIS — M1652 Unilateral post-traumatic osteoarthritis, left hip: Secondary | ICD-10-CM | POA: Diagnosis not present

## 2022-05-04 DIAGNOSIS — Z79899 Other long term (current) drug therapy: Secondary | ICD-10-CM | POA: Diagnosis not present

## 2022-05-04 DIAGNOSIS — E785 Hyperlipidemia, unspecified: Secondary | ICD-10-CM | POA: Diagnosis not present

## 2022-05-04 DIAGNOSIS — I10 Essential (primary) hypertension: Secondary | ICD-10-CM | POA: Diagnosis not present

## 2022-05-04 DIAGNOSIS — Z79891 Long term (current) use of opiate analgesic: Secondary | ICD-10-CM | POA: Diagnosis not present

## 2022-05-04 DIAGNOSIS — Z87310 Personal history of (healed) osteoporosis fracture: Secondary | ICD-10-CM | POA: Diagnosis not present

## 2022-05-04 DIAGNOSIS — I252 Old myocardial infarction: Secondary | ICD-10-CM | POA: Diagnosis not present

## 2022-05-04 DIAGNOSIS — Z7982 Long term (current) use of aspirin: Secondary | ICD-10-CM | POA: Diagnosis not present

## 2022-05-04 DIAGNOSIS — J432 Centrilobular emphysema: Secondary | ICD-10-CM | POA: Diagnosis not present

## 2022-05-04 DIAGNOSIS — M80052S Age-related osteoporosis with current pathological fracture, left femur, sequela: Secondary | ICD-10-CM | POA: Diagnosis not present

## 2022-05-04 DIAGNOSIS — T8484XD Pain due to internal orthopedic prosthetic devices, implants and grafts, subsequent encounter: Secondary | ICD-10-CM | POA: Diagnosis not present

## 2022-05-04 DIAGNOSIS — K219 Gastro-esophageal reflux disease without esophagitis: Secondary | ICD-10-CM | POA: Diagnosis not present

## 2022-05-04 DIAGNOSIS — Z96612 Presence of left artificial shoulder joint: Secondary | ICD-10-CM | POA: Diagnosis not present

## 2022-05-04 DIAGNOSIS — D472 Monoclonal gammopathy: Secondary | ICD-10-CM | POA: Diagnosis not present

## 2022-05-05 DIAGNOSIS — J449 Chronic obstructive pulmonary disease, unspecified: Secondary | ICD-10-CM | POA: Diagnosis not present

## 2022-05-05 DIAGNOSIS — E785 Hyperlipidemia, unspecified: Secondary | ICD-10-CM

## 2022-05-08 ENCOUNTER — Other Ambulatory Visit: Payer: Self-pay | Admitting: Family Medicine

## 2022-05-08 ENCOUNTER — Telehealth: Payer: Self-pay

## 2022-05-08 DIAGNOSIS — D472 Monoclonal gammopathy: Secondary | ICD-10-CM | POA: Diagnosis not present

## 2022-05-08 DIAGNOSIS — M80052S Age-related osteoporosis with current pathological fracture, left femur, sequela: Secondary | ICD-10-CM | POA: Diagnosis not present

## 2022-05-08 DIAGNOSIS — K219 Gastro-esophageal reflux disease without esophagitis: Secondary | ICD-10-CM | POA: Diagnosis not present

## 2022-05-08 DIAGNOSIS — W19XXXD Unspecified fall, subsequent encounter: Secondary | ICD-10-CM | POA: Diagnosis not present

## 2022-05-08 DIAGNOSIS — T8484XD Pain due to internal orthopedic prosthetic devices, implants and grafts, subsequent encounter: Secondary | ICD-10-CM | POA: Diagnosis not present

## 2022-05-08 DIAGNOSIS — J432 Centrilobular emphysema: Secondary | ICD-10-CM | POA: Diagnosis not present

## 2022-05-08 DIAGNOSIS — Z87310 Personal history of (healed) osteoporosis fracture: Secondary | ICD-10-CM | POA: Diagnosis not present

## 2022-05-08 DIAGNOSIS — E785 Hyperlipidemia, unspecified: Secondary | ICD-10-CM | POA: Diagnosis not present

## 2022-05-08 DIAGNOSIS — Z7982 Long term (current) use of aspirin: Secondary | ICD-10-CM | POA: Diagnosis not present

## 2022-05-08 DIAGNOSIS — M1712 Unilateral primary osteoarthritis, left knee: Secondary | ICD-10-CM | POA: Diagnosis not present

## 2022-05-08 DIAGNOSIS — I252 Old myocardial infarction: Secondary | ICD-10-CM | POA: Diagnosis not present

## 2022-05-08 DIAGNOSIS — Z96612 Presence of left artificial shoulder joint: Secondary | ICD-10-CM | POA: Diagnosis not present

## 2022-05-08 DIAGNOSIS — I10 Essential (primary) hypertension: Secondary | ICD-10-CM | POA: Diagnosis not present

## 2022-05-08 DIAGNOSIS — Z79899 Other long term (current) drug therapy: Secondary | ICD-10-CM | POA: Diagnosis not present

## 2022-05-08 DIAGNOSIS — S42475D Nondisplaced transcondylar fracture of left humerus, subsequent encounter for fracture with routine healing: Secondary | ICD-10-CM

## 2022-05-08 DIAGNOSIS — Z79891 Long term (current) use of opiate analgesic: Secondary | ICD-10-CM | POA: Diagnosis not present

## 2022-05-08 DIAGNOSIS — Z8673 Personal history of transient ischemic attack (TIA), and cerebral infarction without residual deficits: Secondary | ICD-10-CM | POA: Diagnosis not present

## 2022-05-08 DIAGNOSIS — M1652 Unilateral post-traumatic osteoarthritis, left hip: Secondary | ICD-10-CM | POA: Diagnosis not present

## 2022-05-08 NOTE — Telephone Encounter (Signed)
Medication Refill - Medication: HYDROcodone-acetaminophen (NORCO) 10-325 MG tablet  Has the patient contacted their pharmacy? no  Preferred Pharmacy (with phone number or street name):  [id number] [address] Phone:  [phone] Fax:  [phone]    Has the patient been seen for an appointment in the last year OR does the patient have an upcoming appointment? Yes.    Agent: Please be advised that RX refills may take up to 3 business days. We ask that you follow-up with your pharmacy.  Encouraged to call pharmacy for refill request

## 2022-05-08 NOTE — Telephone Encounter (Signed)
  Care Management   Follow Up Note   05/08/2022 Name: Pamela Ball MRN: [id number] DOB: 02-10-1946   Referred by: Olin Hauser, DO Reason for referral : Chronic Care Management (RNCM: Patients husband called and left a voice mail asking for help with medication refill for pain medications)   The patients husband, [name], called and left a Voicemail asking the [facility] for assistance with refill of the patients pain medications as she only has 2 pain pills left. RNCM did not call the patients husband back. Will send in-basket message to staff asking for assistance and follow up with the patients husband.   Follow Up Plan: Follow up with provider re: request for refill for pain medication as the patient is almost out.   Noreene Larsson RN, MSN, [place] Mobile: [phone]

## 2022-05-08 NOTE — Telephone Encounter (Signed)
This was just filled on the 20th.  She should not be due for refill at this time.

## 2022-05-10 MED ORDER — HYDROCODONE-ACETAMINOPHEN 10-325 MG PO TABS: 0.5000 | ORAL_TABLET | Freq: Four times a day (QID) | ORAL | 0 refills | Status: DC | PRN

## 2022-05-10 NOTE — Telephone Encounter (Signed)
I have re ordered it today on 05/10/22 already.  They should check with pharmacy for picking up pain medicine.  Also please remind them it is higher dose than the previous ones since the pharmacy ran out of stock of Oxycodone '5mg'$ . This is '10mg'$  dosing and should be cut in half and therefore they would have 20 pills or 40 half pills to use. So they do not run out.  It can be ordered every 2-4 weeks depending on how long it lasts.  They should schedule to be seen by the Pain doctor as well, this referral was done 3 months ago and is in process.  They can call to schedule  [facility] Address: [address] [place], [place] [address] Phone: [phone]  [name], [facility] 05/10/2022, 2:47 PM

## 2022-05-10 NOTE — Telephone Encounter (Signed)
Requested medication (s) are due for refill today: only given #20 with signature to take 6hrs as needed. (5 days worth possibly)  Requested medication (s) are on the active medication list: yes  Last refill:  04/25/22 #20 with 0 RF  Future visit scheduled: no  Notes to clinic:  Pt did see ortho, unsure of result. Rollene Fare commented in note about med that was just filled on 20th. Rx was for 20  and could take q6h. Unclear if ortho to see again, no upcoming appt at [facility]. This medication can not be delegated, please assess.        Requested Prescriptions  Pending Prescriptions Disp Refills   HYDROcodone-acetaminophen (NORCO) 10-325 MG tablet 20 tablet 0    Sig: Take 0.5-1 tablets by mouth every 6 (six) hours as needed.     Not Delegated - Analgesics:  Opioid Agonist Combinations Failed - 05/08/2022  2:06 PM      Failed - This refill cannot be delegated      Failed - Urine Drug Screen completed in last 360 days      Passed - Valid encounter within last 3 months    Recent Outpatient Visits           3 months ago Left arm pain   [place], DO   4 months ago Venous insufficiency of both lower extremities   [place], DO   6 months ago Chronic left shoulder pain   [facility] [place], Coralie Keens, NP   7 months ago Pubic lice   [place], DO   8 months ago Closed fracture of left hip with routine healing, subsequent encounter   [place], DO

## 2022-05-10 NOTE — Telephone Encounter (Signed)
Pt states that pt is out of medication and in a lot of pain and must not have been given enough meds  pls fu to advise.

## 2022-05-12 ENCOUNTER — Other Ambulatory Visit: Payer: Self-pay | Admitting: Family Medicine

## 2022-05-12 DIAGNOSIS — Z79899 Other long term (current) drug therapy: Secondary | ICD-10-CM | POA: Diagnosis not present

## 2022-05-12 DIAGNOSIS — Z96612 Presence of left artificial shoulder joint: Secondary | ICD-10-CM | POA: Diagnosis not present

## 2022-05-12 DIAGNOSIS — W19XXXD Unspecified fall, subsequent encounter: Secondary | ICD-10-CM | POA: Diagnosis not present

## 2022-05-12 DIAGNOSIS — Z79891 Long term (current) use of opiate analgesic: Secondary | ICD-10-CM | POA: Diagnosis not present

## 2022-05-12 DIAGNOSIS — T8484XD Pain due to internal orthopedic prosthetic devices, implants and grafts, subsequent encounter: Secondary | ICD-10-CM | POA: Diagnosis not present

## 2022-05-12 DIAGNOSIS — M1652 Unilateral post-traumatic osteoarthritis, left hip: Secondary | ICD-10-CM | POA: Diagnosis not present

## 2022-05-12 DIAGNOSIS — Z87310 Personal history of (healed) osteoporosis fracture: Secondary | ICD-10-CM | POA: Diagnosis not present

## 2022-05-12 DIAGNOSIS — M1712 Unilateral primary osteoarthritis, left knee: Secondary | ICD-10-CM | POA: Diagnosis not present

## 2022-05-12 DIAGNOSIS — D472 Monoclonal gammopathy: Secondary | ICD-10-CM | POA: Diagnosis not present

## 2022-05-12 DIAGNOSIS — M80052S Age-related osteoporosis with current pathological fracture, left femur, sequela: Secondary | ICD-10-CM | POA: Diagnosis not present

## 2022-05-12 DIAGNOSIS — K219 Gastro-esophageal reflux disease without esophagitis: Secondary | ICD-10-CM

## 2022-05-12 DIAGNOSIS — J432 Centrilobular emphysema: Secondary | ICD-10-CM | POA: Diagnosis not present

## 2022-05-12 DIAGNOSIS — Z8673 Personal history of transient ischemic attack (TIA), and cerebral infarction without residual deficits: Secondary | ICD-10-CM | POA: Diagnosis not present

## 2022-05-12 DIAGNOSIS — I252 Old myocardial infarction: Secondary | ICD-10-CM | POA: Diagnosis not present

## 2022-05-12 DIAGNOSIS — Z7982 Long term (current) use of aspirin: Secondary | ICD-10-CM | POA: Diagnosis not present

## 2022-05-12 DIAGNOSIS — E785 Hyperlipidemia, unspecified: Secondary | ICD-10-CM | POA: Diagnosis not present

## 2022-05-12 DIAGNOSIS — I10 Essential (primary) hypertension: Secondary | ICD-10-CM | POA: Diagnosis not present

## 2022-05-12 NOTE — Telephone Encounter (Signed)
Requested Prescriptions  Pending Prescriptions Disp Refills  . omeprazole (PRILOSEC) 20 MG capsule [Pharmacy Med Name: OMEPRAZOLE DR 20 MG CAPSULE] 90 capsule 1    Sig: TAKE 1 CAPSULE BY MOUTH EVERY DAY BEFORE BREAKFAST     Gastroenterology: Proton Pump Inhibitors Passed - 05/12/2022  1:51 AM      Passed - Valid encounter within last 12 months    Recent Outpatient Visits          3 months ago Left arm pain   [place], DO   5 months ago Venous insufficiency of both lower extremities   [place], DO   6 months ago Chronic left shoulder pain   [facility] [place], Coralie Keens, NP   7 months ago Pubic lice   [place], DO   8 months ago Closed fracture of left hip with routine healing, subsequent encounter   [place], DO

## 2022-05-15 DIAGNOSIS — I10 Essential (primary) hypertension: Secondary | ICD-10-CM | POA: Diagnosis not present

## 2022-05-15 DIAGNOSIS — T8484XD Pain due to internal orthopedic prosthetic devices, implants and grafts, subsequent encounter: Secondary | ICD-10-CM | POA: Diagnosis not present

## 2022-05-15 DIAGNOSIS — Z79891 Long term (current) use of opiate analgesic: Secondary | ICD-10-CM | POA: Diagnosis not present

## 2022-05-15 DIAGNOSIS — K219 Gastro-esophageal reflux disease without esophagitis: Secondary | ICD-10-CM | POA: Diagnosis not present

## 2022-05-15 DIAGNOSIS — Z87310 Personal history of (healed) osteoporosis fracture: Secondary | ICD-10-CM | POA: Diagnosis not present

## 2022-05-15 DIAGNOSIS — J432 Centrilobular emphysema: Secondary | ICD-10-CM | POA: Diagnosis not present

## 2022-05-15 DIAGNOSIS — I252 Old myocardial infarction: Secondary | ICD-10-CM | POA: Diagnosis not present

## 2022-05-15 DIAGNOSIS — M1712 Unilateral primary osteoarthritis, left knee: Secondary | ICD-10-CM | POA: Diagnosis not present

## 2022-05-15 DIAGNOSIS — Z8673 Personal history of transient ischemic attack (TIA), and cerebral infarction without residual deficits: Secondary | ICD-10-CM | POA: Diagnosis not present

## 2022-05-15 DIAGNOSIS — Z7982 Long term (current) use of aspirin: Secondary | ICD-10-CM | POA: Diagnosis not present

## 2022-05-15 DIAGNOSIS — E785 Hyperlipidemia, unspecified: Secondary | ICD-10-CM | POA: Diagnosis not present

## 2022-05-15 DIAGNOSIS — W19XXXD Unspecified fall, subsequent encounter: Secondary | ICD-10-CM | POA: Diagnosis not present

## 2022-05-15 DIAGNOSIS — Z79899 Other long term (current) drug therapy: Secondary | ICD-10-CM | POA: Diagnosis not present

## 2022-05-15 DIAGNOSIS — M80052S Age-related osteoporosis with current pathological fracture, left femur, sequela: Secondary | ICD-10-CM | POA: Diagnosis not present

## 2022-05-15 DIAGNOSIS — Z96612 Presence of left artificial shoulder joint: Secondary | ICD-10-CM | POA: Diagnosis not present

## 2022-05-15 DIAGNOSIS — D472 Monoclonal gammopathy: Secondary | ICD-10-CM | POA: Diagnosis not present

## 2022-05-15 DIAGNOSIS — M1652 Unilateral post-traumatic osteoarthritis, left hip: Secondary | ICD-10-CM | POA: Diagnosis not present

## 2022-05-17 DIAGNOSIS — T8484XD Pain due to internal orthopedic prosthetic devices, implants and grafts, subsequent encounter: Secondary | ICD-10-CM | POA: Diagnosis not present

## 2022-05-17 DIAGNOSIS — Z79899 Other long term (current) drug therapy: Secondary | ICD-10-CM | POA: Diagnosis not present

## 2022-05-17 DIAGNOSIS — I10 Essential (primary) hypertension: Secondary | ICD-10-CM | POA: Diagnosis not present

## 2022-05-17 DIAGNOSIS — D472 Monoclonal gammopathy: Secondary | ICD-10-CM | POA: Diagnosis not present

## 2022-05-17 DIAGNOSIS — Z79891 Long term (current) use of opiate analgesic: Secondary | ICD-10-CM | POA: Diagnosis not present

## 2022-05-17 DIAGNOSIS — Z87310 Personal history of (healed) osteoporosis fracture: Secondary | ICD-10-CM | POA: Diagnosis not present

## 2022-05-17 DIAGNOSIS — K219 Gastro-esophageal reflux disease without esophagitis: Secondary | ICD-10-CM | POA: Diagnosis not present

## 2022-05-17 DIAGNOSIS — Z96612 Presence of left artificial shoulder joint: Secondary | ICD-10-CM | POA: Diagnosis not present

## 2022-05-17 DIAGNOSIS — W19XXXD Unspecified fall, subsequent encounter: Secondary | ICD-10-CM | POA: Diagnosis not present

## 2022-05-17 DIAGNOSIS — E785 Hyperlipidemia, unspecified: Secondary | ICD-10-CM | POA: Diagnosis not present

## 2022-05-17 DIAGNOSIS — M1712 Unilateral primary osteoarthritis, left knee: Secondary | ICD-10-CM | POA: Diagnosis not present

## 2022-05-17 DIAGNOSIS — Z8673 Personal history of transient ischemic attack (TIA), and cerebral infarction without residual deficits: Secondary | ICD-10-CM | POA: Diagnosis not present

## 2022-05-17 DIAGNOSIS — M80052S Age-related osteoporosis with current pathological fracture, left femur, sequela: Secondary | ICD-10-CM | POA: Diagnosis not present

## 2022-05-17 DIAGNOSIS — M1652 Unilateral post-traumatic osteoarthritis, left hip: Secondary | ICD-10-CM | POA: Diagnosis not present

## 2022-05-17 DIAGNOSIS — I252 Old myocardial infarction: Secondary | ICD-10-CM | POA: Diagnosis not present

## 2022-05-17 DIAGNOSIS — Z7982 Long term (current) use of aspirin: Secondary | ICD-10-CM | POA: Diagnosis not present

## 2022-05-17 DIAGNOSIS — J432 Centrilobular emphysema: Secondary | ICD-10-CM | POA: Diagnosis not present

## 2022-05-18 DIAGNOSIS — Z7982 Long term (current) use of aspirin: Secondary | ICD-10-CM | POA: Diagnosis not present

## 2022-05-18 DIAGNOSIS — M80052S Age-related osteoporosis with current pathological fracture, left femur, sequela: Secondary | ICD-10-CM | POA: Diagnosis not present

## 2022-05-18 DIAGNOSIS — Z96612 Presence of left artificial shoulder joint: Secondary | ICD-10-CM | POA: Diagnosis not present

## 2022-05-18 DIAGNOSIS — E785 Hyperlipidemia, unspecified: Secondary | ICD-10-CM | POA: Diagnosis not present

## 2022-05-18 DIAGNOSIS — D472 Monoclonal gammopathy: Secondary | ICD-10-CM | POA: Diagnosis not present

## 2022-05-18 DIAGNOSIS — J432 Centrilobular emphysema: Secondary | ICD-10-CM | POA: Diagnosis not present

## 2022-05-18 DIAGNOSIS — K219 Gastro-esophageal reflux disease without esophagitis: Secondary | ICD-10-CM | POA: Diagnosis not present

## 2022-05-18 DIAGNOSIS — W19XXXD Unspecified fall, subsequent encounter: Secondary | ICD-10-CM | POA: Diagnosis not present

## 2022-05-18 DIAGNOSIS — M1712 Unilateral primary osteoarthritis, left knee: Secondary | ICD-10-CM | POA: Diagnosis not present

## 2022-05-18 DIAGNOSIS — I252 Old myocardial infarction: Secondary | ICD-10-CM | POA: Diagnosis not present

## 2022-05-18 DIAGNOSIS — Z79891 Long term (current) use of opiate analgesic: Secondary | ICD-10-CM | POA: Diagnosis not present

## 2022-05-18 DIAGNOSIS — Z8673 Personal history of transient ischemic attack (TIA), and cerebral infarction without residual deficits: Secondary | ICD-10-CM | POA: Diagnosis not present

## 2022-05-18 DIAGNOSIS — T8484XD Pain due to internal orthopedic prosthetic devices, implants and grafts, subsequent encounter: Secondary | ICD-10-CM | POA: Diagnosis not present

## 2022-05-18 DIAGNOSIS — I10 Essential (primary) hypertension: Secondary | ICD-10-CM | POA: Diagnosis not present

## 2022-05-18 DIAGNOSIS — M1652 Unilateral post-traumatic osteoarthritis, left hip: Secondary | ICD-10-CM | POA: Diagnosis not present

## 2022-05-18 DIAGNOSIS — Z79899 Other long term (current) drug therapy: Secondary | ICD-10-CM | POA: Diagnosis not present

## 2022-05-18 DIAGNOSIS — Z87310 Personal history of (healed) osteoporosis fracture: Secondary | ICD-10-CM | POA: Diagnosis not present

## 2022-05-22 DIAGNOSIS — J432 Centrilobular emphysema: Secondary | ICD-10-CM | POA: Diagnosis not present

## 2022-05-22 DIAGNOSIS — Z79891 Long term (current) use of opiate analgesic: Secondary | ICD-10-CM | POA: Diagnosis not present

## 2022-05-22 DIAGNOSIS — I10 Essential (primary) hypertension: Secondary | ICD-10-CM | POA: Diagnosis not present

## 2022-05-22 DIAGNOSIS — Z87310 Personal history of (healed) osteoporosis fracture: Secondary | ICD-10-CM | POA: Diagnosis not present

## 2022-05-22 DIAGNOSIS — Z7982 Long term (current) use of aspirin: Secondary | ICD-10-CM | POA: Diagnosis not present

## 2022-05-22 DIAGNOSIS — M80052S Age-related osteoporosis with current pathological fracture, left femur, sequela: Secondary | ICD-10-CM | POA: Diagnosis not present

## 2022-05-22 DIAGNOSIS — E785 Hyperlipidemia, unspecified: Secondary | ICD-10-CM | POA: Diagnosis not present

## 2022-05-22 DIAGNOSIS — Z79899 Other long term (current) drug therapy: Secondary | ICD-10-CM | POA: Diagnosis not present

## 2022-05-22 DIAGNOSIS — Z96612 Presence of left artificial shoulder joint: Secondary | ICD-10-CM | POA: Diagnosis not present

## 2022-05-22 DIAGNOSIS — I252 Old myocardial infarction: Secondary | ICD-10-CM | POA: Diagnosis not present

## 2022-05-22 DIAGNOSIS — M1712 Unilateral primary osteoarthritis, left knee: Secondary | ICD-10-CM | POA: Diagnosis not present

## 2022-05-22 DIAGNOSIS — K219 Gastro-esophageal reflux disease without esophagitis: Secondary | ICD-10-CM | POA: Diagnosis not present

## 2022-05-22 DIAGNOSIS — Z8673 Personal history of transient ischemic attack (TIA), and cerebral infarction without residual deficits: Secondary | ICD-10-CM | POA: Diagnosis not present

## 2022-05-22 DIAGNOSIS — M1652 Unilateral post-traumatic osteoarthritis, left hip: Secondary | ICD-10-CM | POA: Diagnosis not present

## 2022-05-22 DIAGNOSIS — W19XXXD Unspecified fall, subsequent encounter: Secondary | ICD-10-CM | POA: Diagnosis not present

## 2022-05-22 DIAGNOSIS — T8484XD Pain due to internal orthopedic prosthetic devices, implants and grafts, subsequent encounter: Secondary | ICD-10-CM | POA: Diagnosis not present

## 2022-05-22 DIAGNOSIS — D472 Monoclonal gammopathy: Secondary | ICD-10-CM | POA: Diagnosis not present

## 2022-05-25 ENCOUNTER — Encounter: Payer: Self-pay | Admitting: Family Medicine

## 2022-05-25 ENCOUNTER — Ambulatory Visit (INDEPENDENT_AMBULATORY_CARE_PROVIDER_SITE_OTHER): Payer: Medicare Other | Admitting: Family Medicine

## 2022-05-25 VITALS — BP 123/56 | HR 92 | Ht <= 58 in | Wt 91.2 lb

## 2022-05-25 DIAGNOSIS — G8929 Other chronic pain: Secondary | ICD-10-CM | POA: Diagnosis not present

## 2022-05-25 DIAGNOSIS — R591 Generalized enlarged lymph nodes: Secondary | ICD-10-CM | POA: Diagnosis not present

## 2022-05-25 DIAGNOSIS — S42475D Nondisplaced transcondylar fracture of left humerus, subsequent encounter for fracture with routine healing: Secondary | ICD-10-CM

## 2022-05-25 DIAGNOSIS — M25561 Pain in right knee: Secondary | ICD-10-CM

## 2022-05-25 DIAGNOSIS — Z96651 Presence of right artificial knee joint: Secondary | ICD-10-CM | POA: Diagnosis not present

## 2022-05-25 DIAGNOSIS — M170 Bilateral primary osteoarthritis of knee: Secondary | ICD-10-CM

## 2022-05-25 MED ORDER — HYDROCODONE-ACETAMINOPHEN 5-325 MG PO TABS: 1.0000 | ORAL_TABLET | Freq: Four times a day (QID) | ORAL | 0 refills | Status: DC | PRN

## 2022-05-25 NOTE — Patient Instructions (Addendum)
Thank you for coming to the office today.  Wait 3 more weeks on the lymph nodes if not resolved let me know we can do an ultrasound.  I would recommend a different ortho that can handle all that you are asking for  Rhett K. Hallows, MD [facility] would be a good option  [address] [place], [place] [address]  CONTACT Phone: [phone]  Check with Pharmacy if they have Hydrocodone 5/325 or 10/325 or Oxycodone.  Please schedule a Follow-up Appointment to: Return if symptoms worsen or fail to improve.  If you have any other questions or concerns, please feel free to call the office or send a message through [facility]. You may also schedule an earlier appointment if necessary.  Additionally, you may be receiving a survey about your experience at our office within a few days to 1 week by e-mail or mail. We value your feedback.  [name], DO [place]

## 2022-05-25 NOTE — Progress Notes (Signed)
Subjective:    Patient ID: Pamela Ball, female    DOB: September 13, 1946, 76 y.o.   MRN: [id number]  Pamela Ball is a 76 y.o. female presenting on 05/25/2022 for Adenopathy and Pain   HPI  Lymph Nodes R Neck swollen Reports isolated spot recently past 2-3 weeks, has been slightly tender. No other symptoms and no other locations swollen. Admits some reduced weight  Right Knee Pain, Chronic Osteoarthritis, s/p TKR Previuosly we/ Dr Rudene Christians - [facility] Awaiting on Knee replacement, considering remove / replace. They may need to go to other surgeon that can do this, awaiting info and referral.  Chronic Pain / Back Pain Out of Hydrocdone, pharmacy has 5/325 back in stock will need re order.      05/25/2022    9:05 AM 02/16/2022   11:08 AM 01/16/2022   11:25 AM  Depression screen PHQ 2/9  Decreased Interest 0 0 0  Down, Depressed, Hopeless 0 0 0  PHQ - 2 Score 0 0 0  Altered sleeping 3 3 0  Tired, decreased energy 0 0 0  Change in appetite 3 3 0  Feeling bad or failure about yourself  0 0 0  Trouble concentrating 0 0 0  Moving slowly or fidgety/restless 0 0 0  Suicidal thoughts 0 0 0  PHQ-9 Score 6 6 0  Difficult doing work/chores Not difficult at all Not difficult at all Not difficult at all    Social History   Tobacco Use   Smoking status: Never   Smokeless tobacco: Never  Vaping Use   Vaping Use: Never used  Substance Use Topics   Alcohol use: No    Comment: drink occasionally    Drug use: No    Review of Systems Per HPI unless specifically indicated above     Objective:    BP (!) 123/56   Pulse 92   Ht '4\' 10"'$  (1.473 m)   Wt 91 lb 3.2 oz (41.4 kg)   SpO2 95%   BMI 19.06 kg/m   Wt Readings from Last 3 Encounters:  05/25/22 91 lb 3.2 oz (41.4 kg)  01/16/22 97 lb (44 kg)  12/23/21 97 lb (44 kg)    Physical Exam Vitals and nursing note reviewed.  Constitutional:      General: She is not in acute distress.    Appearance: Normal appearance.  She is well-developed. She is not diaphoretic.     Comments: Frail elderly 76 yr female, currently mostly comfortable but some pain from knee and back, cooperative  HENT:     Head: Normocephalic and atraumatic.  Eyes:     General:        Right eye: No discharge.        Left eye: No discharge.     Conjunctiva/sclera: Conjunctivae normal.  Cardiovascular:     Rate and Rhythm: Normal rate.  Pulmonary:     Effort: Pulmonary effort is normal.  Skin:    General: Skin is warm and dry.     Findings: No erythema or rash.  Neurological:     Mental Status: She is alert and oriented to person, place, and time.  Psychiatric:        Mood and Affect: Mood normal.        Behavior: Behavior normal.        Thought Content: Thought content normal.     Comments: Well groomed, good eye contact, normal speech and thoughts    Results for orders placed or  performed during the hospital encounter of 12/23/21  Resp Panel by RT-PCR (Flu A&B, Covid) Nasopharyngeal Swab   Specimen: Nasopharyngeal Swab; Nasopharyngeal(NP) swabs in vial transport medium  Result Value Ref Range   SARS Coronavirus 2 by RT PCR NEGATIVE NEGATIVE   Influenza A by PCR NEGATIVE NEGATIVE   Influenza B by PCR NEGATIVE NEGATIVE  CBC  Result Value Ref Range   WBC 11.2 (H) 4.0 - 10.5 K/uL   RBC 3.64 (L) 3.87 - 5.11 MIL/uL   Hemoglobin 11.2 (L) 12.0 - 15.0 g/dL   HCT 34.7 (L) 36.0 - 46.0 %   MCV 95.3 80.0 - 100.0 fL   MCH 30.8 26.0 - 34.0 pg   MCHC 32.3 30.0 - 36.0 g/dL   RDW 14.2 11.5 - 15.5 %   Platelets 262 150 - 400 K/uL   nRBC 0.0 0.0 - 0.2 %  CBC  Result Value Ref Range   WBC 7.1 4.0 - 10.5 K/uL   RBC 2.84 (L) 3.87 - 5.11 MIL/uL   Hemoglobin 8.9 (L) 12.0 - 15.0 g/dL   HCT 27.6 (L) 36.0 - 46.0 %   MCV 97.2 80.0 - 100.0 fL   MCH 31.3 26.0 - 34.0 pg   MCHC 32.2 30.0 - 36.0 g/dL   RDW 14.6 11.5 - 15.5 %   Platelets 217 150 - 400 K/uL   nRBC 0.0 0.0 - 0.2 %  Basic metabolic panel  Result Value Ref Range   Sodium 137  135 - 145 mmol/L   Potassium 3.9 3.5 - 5.1 mmol/L   Chloride 107 98 - 111 mmol/L   CO2 26 22 - 32 mmol/L   Glucose, Bld 109 (H) 70 - 99 mg/dL   BUN 15 8 - 23 mg/dL   Creatinine, Ser 0.72 0.44 - 1.00 mg/dL   Calcium 7.8 (L) 8.9 - 10.3 mg/dL   GFR, Estimated >60 >60 mL/min   Anion gap 4 (L) 5 - 15  Type and screen [place]  Result Value Ref Range   ABO/RH(D) O NEG    Antibody Screen NEG    Sample Expiration      12/26/2021,2359 Performed at [facility], [address] [place], [place] [address]       Assessment & Plan:   Problem List Items Addressed This Visit     Osteoarthritis of knees, bilateral - Primary   Relevant Medications   HYDROcodone-acetaminophen (NORCO/VICODIN) 5-325 MG tablet   Other Visit Diagnoses     S/P total knee replacement, right       Relevant Medications   HYDROcodone-acetaminophen (NORCO/VICODIN) 5-325 MG tablet   Chronic pain of right knee       Relevant Medications   HYDROcodone-acetaminophen (NORCO/VICODIN) 5-325 MG tablet   Lymphadenopathy of head and neck       Relevant Medications   HYDROcodone-acetaminophen (NORCO/VICODIN) 5-325 MG tablet   Closed nondisplaced transcondylar fracture of left humerus with routine healing, subsequent encounter           R Neck LAD, mild, some tenderness No other obvious etiology No other concerning LAD identified. Wait 3 more weeks on the lymph nodes if not resolved let me know we can do an ultrasound.  Chronic Back Pain  Chronic Knee Pain, Right S/p prior replacement TKR, now with persistent problem due for revision and replacement in future, they are asking about going to knee replacement specialist, previous ortho locally would not be able to replace. They are looking into Rhett K. Hallows, MD  [address] [place], [place] [address] CONTACT Phone: [phone]  Agree to re order her chronic pain medication  that allows her to improve movement and function. Re send Hydrocodone 5/'325mg'$  PRN - now back in stock.  Meds ordered this encounter  Medications   HYDROcodone-acetaminophen (NORCO/VICODIN) 5-325 MG tablet    Sig: Take 1 tablet by mouth every 6 (six) hours as needed for moderate pain.    Dispense:  60 tablet    Refill:  0      Follow up plan: Return if symptoms worsen or fail to improve.   [name], [facility] 05/25/2022, 8:56 AM

## 2022-05-26 DIAGNOSIS — I252 Old myocardial infarction: Secondary | ICD-10-CM | POA: Diagnosis not present

## 2022-05-26 DIAGNOSIS — J432 Centrilobular emphysema: Secondary | ICD-10-CM | POA: Diagnosis not present

## 2022-05-26 DIAGNOSIS — I10 Essential (primary) hypertension: Secondary | ICD-10-CM | POA: Diagnosis not present

## 2022-05-26 DIAGNOSIS — M1652 Unilateral post-traumatic osteoarthritis, left hip: Secondary | ICD-10-CM | POA: Diagnosis not present

## 2022-05-26 DIAGNOSIS — Z79899 Other long term (current) drug therapy: Secondary | ICD-10-CM | POA: Diagnosis not present

## 2022-05-26 DIAGNOSIS — D472 Monoclonal gammopathy: Secondary | ICD-10-CM | POA: Diagnosis not present

## 2022-05-26 DIAGNOSIS — E785 Hyperlipidemia, unspecified: Secondary | ICD-10-CM | POA: Diagnosis not present

## 2022-05-26 DIAGNOSIS — M1712 Unilateral primary osteoarthritis, left knee: Secondary | ICD-10-CM | POA: Diagnosis not present

## 2022-05-26 DIAGNOSIS — Z79891 Long term (current) use of opiate analgesic: Secondary | ICD-10-CM | POA: Diagnosis not present

## 2022-05-26 DIAGNOSIS — K219 Gastro-esophageal reflux disease without esophagitis: Secondary | ICD-10-CM | POA: Diagnosis not present

## 2022-05-26 DIAGNOSIS — T8484XD Pain due to internal orthopedic prosthetic devices, implants and grafts, subsequent encounter: Secondary | ICD-10-CM | POA: Diagnosis not present

## 2022-05-26 DIAGNOSIS — Z96612 Presence of left artificial shoulder joint: Secondary | ICD-10-CM | POA: Diagnosis not present

## 2022-05-26 DIAGNOSIS — Z87310 Personal history of (healed) osteoporosis fracture: Secondary | ICD-10-CM | POA: Diagnosis not present

## 2022-05-26 DIAGNOSIS — W19XXXD Unspecified fall, subsequent encounter: Secondary | ICD-10-CM | POA: Diagnosis not present

## 2022-05-26 DIAGNOSIS — Z8673 Personal history of transient ischemic attack (TIA), and cerebral infarction without residual deficits: Secondary | ICD-10-CM | POA: Diagnosis not present

## 2022-05-26 DIAGNOSIS — M80052S Age-related osteoporosis with current pathological fracture, left femur, sequela: Secondary | ICD-10-CM | POA: Diagnosis not present

## 2022-05-26 DIAGNOSIS — Z7982 Long term (current) use of aspirin: Secondary | ICD-10-CM | POA: Diagnosis not present

## 2022-05-29 DIAGNOSIS — Z7982 Long term (current) use of aspirin: Secondary | ICD-10-CM | POA: Diagnosis not present

## 2022-05-29 DIAGNOSIS — J432 Centrilobular emphysema: Secondary | ICD-10-CM | POA: Diagnosis not present

## 2022-05-29 DIAGNOSIS — W19XXXD Unspecified fall, subsequent encounter: Secondary | ICD-10-CM | POA: Diagnosis not present

## 2022-05-29 DIAGNOSIS — Z79891 Long term (current) use of opiate analgesic: Secondary | ICD-10-CM | POA: Diagnosis not present

## 2022-05-29 DIAGNOSIS — M1712 Unilateral primary osteoarthritis, left knee: Secondary | ICD-10-CM | POA: Diagnosis not present

## 2022-05-29 DIAGNOSIS — T8484XD Pain due to internal orthopedic prosthetic devices, implants and grafts, subsequent encounter: Secondary | ICD-10-CM | POA: Diagnosis not present

## 2022-05-29 DIAGNOSIS — Z79899 Other long term (current) drug therapy: Secondary | ICD-10-CM | POA: Diagnosis not present

## 2022-05-29 DIAGNOSIS — M1652 Unilateral post-traumatic osteoarthritis, left hip: Secondary | ICD-10-CM | POA: Diagnosis not present

## 2022-05-29 DIAGNOSIS — Z8673 Personal history of transient ischemic attack (TIA), and cerebral infarction without residual deficits: Secondary | ICD-10-CM | POA: Diagnosis not present

## 2022-05-29 DIAGNOSIS — I10 Essential (primary) hypertension: Secondary | ICD-10-CM | POA: Diagnosis not present

## 2022-05-29 DIAGNOSIS — K219 Gastro-esophageal reflux disease without esophagitis: Secondary | ICD-10-CM | POA: Diagnosis not present

## 2022-05-29 DIAGNOSIS — I252 Old myocardial infarction: Secondary | ICD-10-CM | POA: Diagnosis not present

## 2022-05-29 DIAGNOSIS — E785 Hyperlipidemia, unspecified: Secondary | ICD-10-CM | POA: Diagnosis not present

## 2022-05-29 DIAGNOSIS — M80052S Age-related osteoporosis with current pathological fracture, left femur, sequela: Secondary | ICD-10-CM | POA: Diagnosis not present

## 2022-05-29 DIAGNOSIS — Z87310 Personal history of (healed) osteoporosis fracture: Secondary | ICD-10-CM | POA: Diagnosis not present

## 2022-05-29 DIAGNOSIS — Z96612 Presence of left artificial shoulder joint: Secondary | ICD-10-CM | POA: Diagnosis not present

## 2022-05-29 DIAGNOSIS — D472 Monoclonal gammopathy: Secondary | ICD-10-CM | POA: Diagnosis not present

## 2022-06-02 DIAGNOSIS — E785 Hyperlipidemia, unspecified: Secondary | ICD-10-CM | POA: Diagnosis not present

## 2022-06-02 DIAGNOSIS — J432 Centrilobular emphysema: Secondary | ICD-10-CM | POA: Diagnosis not present

## 2022-06-02 DIAGNOSIS — Z79899 Other long term (current) drug therapy: Secondary | ICD-10-CM | POA: Diagnosis not present

## 2022-06-02 DIAGNOSIS — W19XXXD Unspecified fall, subsequent encounter: Secondary | ICD-10-CM | POA: Diagnosis not present

## 2022-06-02 DIAGNOSIS — M80052S Age-related osteoporosis with current pathological fracture, left femur, sequela: Secondary | ICD-10-CM | POA: Diagnosis not present

## 2022-06-02 DIAGNOSIS — M1712 Unilateral primary osteoarthritis, left knee: Secondary | ICD-10-CM | POA: Diagnosis not present

## 2022-06-02 DIAGNOSIS — Z8673 Personal history of transient ischemic attack (TIA), and cerebral infarction without residual deficits: Secondary | ICD-10-CM | POA: Diagnosis not present

## 2022-06-02 DIAGNOSIS — Z79891 Long term (current) use of opiate analgesic: Secondary | ICD-10-CM | POA: Diagnosis not present

## 2022-06-02 DIAGNOSIS — I10 Essential (primary) hypertension: Secondary | ICD-10-CM | POA: Diagnosis not present

## 2022-06-02 DIAGNOSIS — Z96612 Presence of left artificial shoulder joint: Secondary | ICD-10-CM | POA: Diagnosis not present

## 2022-06-02 DIAGNOSIS — K219 Gastro-esophageal reflux disease without esophagitis: Secondary | ICD-10-CM | POA: Diagnosis not present

## 2022-06-02 DIAGNOSIS — Z87310 Personal history of (healed) osteoporosis fracture: Secondary | ICD-10-CM | POA: Diagnosis not present

## 2022-06-02 DIAGNOSIS — T8484XD Pain due to internal orthopedic prosthetic devices, implants and grafts, subsequent encounter: Secondary | ICD-10-CM | POA: Diagnosis not present

## 2022-06-02 DIAGNOSIS — I252 Old myocardial infarction: Secondary | ICD-10-CM | POA: Diagnosis not present

## 2022-06-02 DIAGNOSIS — M1652 Unilateral post-traumatic osteoarthritis, left hip: Secondary | ICD-10-CM | POA: Diagnosis not present

## 2022-06-02 DIAGNOSIS — D472 Monoclonal gammopathy: Secondary | ICD-10-CM | POA: Diagnosis not present

## 2022-06-02 DIAGNOSIS — Z7982 Long term (current) use of aspirin: Secondary | ICD-10-CM | POA: Diagnosis not present

## 2022-06-05 DIAGNOSIS — Z79891 Long term (current) use of opiate analgesic: Secondary | ICD-10-CM | POA: Diagnosis not present

## 2022-06-05 DIAGNOSIS — Z79899 Other long term (current) drug therapy: Secondary | ICD-10-CM | POA: Diagnosis not present

## 2022-06-05 DIAGNOSIS — J432 Centrilobular emphysema: Secondary | ICD-10-CM | POA: Diagnosis not present

## 2022-06-05 DIAGNOSIS — Z7982 Long term (current) use of aspirin: Secondary | ICD-10-CM | POA: Diagnosis not present

## 2022-06-05 DIAGNOSIS — Z8673 Personal history of transient ischemic attack (TIA), and cerebral infarction without residual deficits: Secondary | ICD-10-CM | POA: Diagnosis not present

## 2022-06-05 DIAGNOSIS — E785 Hyperlipidemia, unspecified: Secondary | ICD-10-CM | POA: Diagnosis not present

## 2022-06-05 DIAGNOSIS — K219 Gastro-esophageal reflux disease without esophagitis: Secondary | ICD-10-CM | POA: Diagnosis not present

## 2022-06-05 DIAGNOSIS — D472 Monoclonal gammopathy: Secondary | ICD-10-CM | POA: Diagnosis not present

## 2022-06-05 DIAGNOSIS — M1652 Unilateral post-traumatic osteoarthritis, left hip: Secondary | ICD-10-CM | POA: Diagnosis not present

## 2022-06-05 DIAGNOSIS — Z87310 Personal history of (healed) osteoporosis fracture: Secondary | ICD-10-CM | POA: Diagnosis not present

## 2022-06-05 DIAGNOSIS — Z96612 Presence of left artificial shoulder joint: Secondary | ICD-10-CM | POA: Diagnosis not present

## 2022-06-05 DIAGNOSIS — T8484XD Pain due to internal orthopedic prosthetic devices, implants and grafts, subsequent encounter: Secondary | ICD-10-CM | POA: Diagnosis not present

## 2022-06-05 DIAGNOSIS — M1712 Unilateral primary osteoarthritis, left knee: Secondary | ICD-10-CM | POA: Diagnosis not present

## 2022-06-05 DIAGNOSIS — M80052S Age-related osteoporosis with current pathological fracture, left femur, sequela: Secondary | ICD-10-CM | POA: Diagnosis not present

## 2022-06-05 DIAGNOSIS — W19XXXD Unspecified fall, subsequent encounter: Secondary | ICD-10-CM | POA: Diagnosis not present

## 2022-06-05 DIAGNOSIS — I10 Essential (primary) hypertension: Secondary | ICD-10-CM | POA: Diagnosis not present

## 2022-06-05 DIAGNOSIS — I252 Old myocardial infarction: Secondary | ICD-10-CM | POA: Diagnosis not present

## 2022-06-08 DIAGNOSIS — Z8673 Personal history of transient ischemic attack (TIA), and cerebral infarction without residual deficits: Secondary | ICD-10-CM | POA: Diagnosis not present

## 2022-06-08 DIAGNOSIS — I10 Essential (primary) hypertension: Secondary | ICD-10-CM | POA: Diagnosis not present

## 2022-06-08 DIAGNOSIS — Z87310 Personal history of (healed) osteoporosis fracture: Secondary | ICD-10-CM | POA: Diagnosis not present

## 2022-06-08 DIAGNOSIS — M1652 Unilateral post-traumatic osteoarthritis, left hip: Secondary | ICD-10-CM | POA: Diagnosis not present

## 2022-06-08 DIAGNOSIS — Z96612 Presence of left artificial shoulder joint: Secondary | ICD-10-CM | POA: Diagnosis not present

## 2022-06-08 DIAGNOSIS — K219 Gastro-esophageal reflux disease without esophagitis: Secondary | ICD-10-CM | POA: Diagnosis not present

## 2022-06-08 DIAGNOSIS — T8484XD Pain due to internal orthopedic prosthetic devices, implants and grafts, subsequent encounter: Secondary | ICD-10-CM | POA: Diagnosis not present

## 2022-06-08 DIAGNOSIS — I252 Old myocardial infarction: Secondary | ICD-10-CM | POA: Diagnosis not present

## 2022-06-08 DIAGNOSIS — D472 Monoclonal gammopathy: Secondary | ICD-10-CM | POA: Diagnosis not present

## 2022-06-08 DIAGNOSIS — Z79899 Other long term (current) drug therapy: Secondary | ICD-10-CM | POA: Diagnosis not present

## 2022-06-08 DIAGNOSIS — Z7982 Long term (current) use of aspirin: Secondary | ICD-10-CM | POA: Diagnosis not present

## 2022-06-08 DIAGNOSIS — W19XXXD Unspecified fall, subsequent encounter: Secondary | ICD-10-CM | POA: Diagnosis not present

## 2022-06-08 DIAGNOSIS — E785 Hyperlipidemia, unspecified: Secondary | ICD-10-CM | POA: Diagnosis not present

## 2022-06-08 DIAGNOSIS — Z79891 Long term (current) use of opiate analgesic: Secondary | ICD-10-CM | POA: Diagnosis not present

## 2022-06-08 DIAGNOSIS — J432 Centrilobular emphysema: Secondary | ICD-10-CM | POA: Diagnosis not present

## 2022-06-08 DIAGNOSIS — M1712 Unilateral primary osteoarthritis, left knee: Secondary | ICD-10-CM | POA: Diagnosis not present

## 2022-06-08 DIAGNOSIS — M80052S Age-related osteoporosis with current pathological fracture, left femur, sequela: Secondary | ICD-10-CM | POA: Diagnosis not present

## 2022-06-12 DIAGNOSIS — M1652 Unilateral post-traumatic osteoarthritis, left hip: Secondary | ICD-10-CM | POA: Diagnosis not present

## 2022-06-12 DIAGNOSIS — K219 Gastro-esophageal reflux disease without esophagitis: Secondary | ICD-10-CM | POA: Diagnosis not present

## 2022-06-12 DIAGNOSIS — I10 Essential (primary) hypertension: Secondary | ICD-10-CM | POA: Diagnosis not present

## 2022-06-12 DIAGNOSIS — Z96612 Presence of left artificial shoulder joint: Secondary | ICD-10-CM | POA: Diagnosis not present

## 2022-06-12 DIAGNOSIS — M80052S Age-related osteoporosis with current pathological fracture, left femur, sequela: Secondary | ICD-10-CM | POA: Diagnosis not present

## 2022-06-12 DIAGNOSIS — Z79899 Other long term (current) drug therapy: Secondary | ICD-10-CM | POA: Diagnosis not present

## 2022-06-12 DIAGNOSIS — D472 Monoclonal gammopathy: Secondary | ICD-10-CM | POA: Diagnosis not present

## 2022-06-12 DIAGNOSIS — Z8673 Personal history of transient ischemic attack (TIA), and cerebral infarction without residual deficits: Secondary | ICD-10-CM | POA: Diagnosis not present

## 2022-06-12 DIAGNOSIS — J432 Centrilobular emphysema: Secondary | ICD-10-CM | POA: Diagnosis not present

## 2022-06-12 DIAGNOSIS — I252 Old myocardial infarction: Secondary | ICD-10-CM | POA: Diagnosis not present

## 2022-06-12 DIAGNOSIS — T8484XD Pain due to internal orthopedic prosthetic devices, implants and grafts, subsequent encounter: Secondary | ICD-10-CM | POA: Diagnosis not present

## 2022-06-12 DIAGNOSIS — M1712 Unilateral primary osteoarthritis, left knee: Secondary | ICD-10-CM | POA: Diagnosis not present

## 2022-06-12 DIAGNOSIS — E785 Hyperlipidemia, unspecified: Secondary | ICD-10-CM | POA: Diagnosis not present

## 2022-06-12 DIAGNOSIS — Z7982 Long term (current) use of aspirin: Secondary | ICD-10-CM | POA: Diagnosis not present

## 2022-06-12 DIAGNOSIS — Z87310 Personal history of (healed) osteoporosis fracture: Secondary | ICD-10-CM | POA: Diagnosis not present

## 2022-06-12 DIAGNOSIS — Z79891 Long term (current) use of opiate analgesic: Secondary | ICD-10-CM | POA: Diagnosis not present

## 2022-06-12 DIAGNOSIS — W19XXXD Unspecified fall, subsequent encounter: Secondary | ICD-10-CM | POA: Diagnosis not present

## 2022-06-15 ENCOUNTER — Encounter: Payer: Self-pay | Admitting: Internal Medicine

## 2022-06-15 ENCOUNTER — Ambulatory Visit: Payer: Self-pay | Admitting: *Deleted

## 2022-06-15 ENCOUNTER — Ambulatory Visit
Admission: RE | Admit: 2022-06-15 | Discharge: 2022-06-15 | Disposition: A | Discharge status: Home / Self Care | Payer: Medicare Other | Source: Home / Self Care | Attending: Internal Medicine | Admitting: Internal Medicine

## 2022-06-15 ENCOUNTER — Ambulatory Visit (INDEPENDENT_AMBULATORY_CARE_PROVIDER_SITE_OTHER): Payer: Medicare Other | Admitting: Internal Medicine

## 2022-06-15 ENCOUNTER — Ambulatory Visit
Admission: RE | Admit: 2022-06-15 | Discharge: 2022-06-15 | Disposition: A | Discharge status: Home / Self Care | Payer: Medicare Other | Source: Ambulatory Visit | Attending: Internal Medicine | Admitting: Internal Medicine

## 2022-06-15 VITALS — BP 106/60 | HR 102 | Temp 97.5°F | Wt 90.0 lb

## 2022-06-15 DIAGNOSIS — J432 Centrilobular emphysema: Secondary | ICD-10-CM | POA: Diagnosis not present

## 2022-06-15 DIAGNOSIS — I252 Old myocardial infarction: Secondary | ICD-10-CM | POA: Diagnosis not present

## 2022-06-15 DIAGNOSIS — Z7982 Long term (current) use of aspirin: Secondary | ICD-10-CM | POA: Diagnosis not present

## 2022-06-15 DIAGNOSIS — Z87310 Personal history of (healed) osteoporosis fracture: Secondary | ICD-10-CM | POA: Diagnosis not present

## 2022-06-15 DIAGNOSIS — M1712 Unilateral primary osteoarthritis, left knee: Secondary | ICD-10-CM | POA: Diagnosis not present

## 2022-06-15 DIAGNOSIS — Z79891 Long term (current) use of opiate analgesic: Secondary | ICD-10-CM | POA: Diagnosis not present

## 2022-06-15 DIAGNOSIS — W010XXA Fall on same level from slipping, tripping and stumbling without subsequent striking against object, initial encounter: Secondary | ICD-10-CM | POA: Diagnosis not present

## 2022-06-15 DIAGNOSIS — T8484XD Pain due to internal orthopedic prosthetic devices, implants and grafts, subsequent encounter: Secondary | ICD-10-CM | POA: Diagnosis not present

## 2022-06-15 DIAGNOSIS — M25551 Pain in right hip: Secondary | ICD-10-CM

## 2022-06-15 DIAGNOSIS — D472 Monoclonal gammopathy: Secondary | ICD-10-CM | POA: Diagnosis not present

## 2022-06-15 DIAGNOSIS — W19XXXD Unspecified fall, subsequent encounter: Secondary | ICD-10-CM | POA: Diagnosis not present

## 2022-06-15 DIAGNOSIS — Z96612 Presence of left artificial shoulder joint: Secondary | ICD-10-CM | POA: Diagnosis not present

## 2022-06-15 DIAGNOSIS — E785 Hyperlipidemia, unspecified: Secondary | ICD-10-CM | POA: Diagnosis not present

## 2022-06-15 DIAGNOSIS — K219 Gastro-esophageal reflux disease without esophagitis: Secondary | ICD-10-CM | POA: Diagnosis not present

## 2022-06-15 DIAGNOSIS — M1652 Unilateral post-traumatic osteoarthritis, left hip: Secondary | ICD-10-CM | POA: Diagnosis not present

## 2022-06-15 DIAGNOSIS — S79911A Unspecified injury of right hip, initial encounter: Secondary | ICD-10-CM | POA: Diagnosis not present

## 2022-06-15 DIAGNOSIS — Z79899 Other long term (current) drug therapy: Secondary | ICD-10-CM | POA: Diagnosis not present

## 2022-06-15 DIAGNOSIS — I10 Essential (primary) hypertension: Secondary | ICD-10-CM | POA: Diagnosis not present

## 2022-06-15 DIAGNOSIS — Z8673 Personal history of transient ischemic attack (TIA), and cerebral infarction without residual deficits: Secondary | ICD-10-CM | POA: Diagnosis not present

## 2022-06-15 DIAGNOSIS — M80052S Age-related osteoporosis with current pathological fracture, left femur, sequela: Secondary | ICD-10-CM | POA: Diagnosis not present

## 2022-06-15 NOTE — Telephone Encounter (Signed)
Reason for Disposition  [1] SEVERE pain AND [2] not improved 2 hours after pain medicine/ice packs  Answer Assessment - Initial Assessment Questions 1. MECHANISM: "How did the injury happen?" (e.g., twisting injury, direct blow)      Fell- off balance- fell 2. ONSET: "When did the injury happen?" (Minutes or hours ago)      Last week end- Saturday  3. LOCATION: "Where is the injury located?"      R hip 4. APPEARANCE of INJURY: "What does the injury look like?"  (e.g., deformity of leg)     No bruising/swelling 5. SEVERITY: "Can you put weight on that leg?" "Can you walk?"      limping 6. SIZE: For cuts, bruises, or swelling, ask: "How large is it?" (e.g., inches or centimeters;  entire joint)        7. PAIN: "Is there pain?" If Yes, ask: "How bad is the pain?"   "What does it keep you from doing?" (e.g., Scale 1-10; or mild, moderate, severe)   -  NONE: (0): no pain   -  MILD (1-3): doesn't interfere with normal activities    -  MODERATE (4-7): interferes with normal activities (e.g., work or school) or awakens from sleep, limping    -  SEVERE (8-10): excruciating pain, unable to do any normal activities, unable to walk     severe 8. TETANUS: For any breaks in the skin, ask: "When was the last tetanus booster?"       9. OTHER SYMPTOMS: "Do you have any other symptoms?"      no 10. PREGNANCY: "Is there any chance you are pregnant?" "When was your last menstrual period?"       *No Answer*  Protocols used: Hip Injury-A-AH

## 2022-06-15 NOTE — Telephone Encounter (Signed)
  Chief Complaint: R hip pain Symptoms: pain in hip- patient fell Saturday Frequency: fell Saturday- pain in worse Pertinent Negatives: Patient denies   Disposition: '[]'$ ED /'[]'$ Urgent Care (no appt availability in office) / '[x]'$ Appointment(In office/virtual)/ '[]'$  [facility]/ '[]'$ Home Care/ '[]'$ Refused Recommended Disposition /'[]'$ [facility]/ '[]'$  Follow-up with PCP Additional Notes:

## 2022-06-15 NOTE — Patient Instructions (Signed)
Hip Exercises Ask your health care provider which exercises are safe for you. Do exercises exactly as told by your health care provider and adjust them as directed. It is normal to feel mild stretching, pulling, tightness, or discomfort as you do these exercises. Stop right away if you feel sudden pain or your pain gets worse. Do not begin these exercises until told by your health care provider. Stretching and range-of-motion exercises These exercises warm up your muscles and joints and improve the movement and flexibility of your hip. These exercises also help to relieve pain, numbness, and tingling. You may be asked to limit your range of motion if you had a hip replacement. Talk to your health care provider about these restrictions. Hamstrings, supine  Lie on your back (supine position). Loop a belt or towel over the ball of your left / right foot. The ball of your foot is on the walking surface, right under your toes. Straighten your left / right knee and slowly pull on the belt or towel to raise your leg until you feel a gentle stretch behind your knee (hamstring). Do not let your knee bend while you do this. Keep your other leg flat on the floor. Hold this position for __________ seconds. Slowly return your leg to the starting position. Repeat __________ times. Complete this exercise __________ times a day. Hip rotation  Lie on your back on a firm surface. With your left / right hand, gently pull your left / right knee toward the shoulder that is on the same side of the body. Stop when your knee is pointing toward the ceiling. Hold your left / right ankle with your other hand. Keeping your knee steady, gently pull your left / right ankle toward your other shoulder until you feel a stretch in your buttocks. Keep your hips and shoulders firmly planted while you do this stretch. Hold this position for __________ seconds. Repeat __________ times. Complete this exercise __________ times a  day. Seated stretch This exercise is sometimes called hamstrings and adductors stretch. Sit on the floor with your legs stretched wide. Keep your knees straight during this exercise. Keeping your head and back in a straight line, bend at your waist to reach for your left foot (position A). You should feel a stretch in your right inner thigh (adductors). Hold this position for __________ seconds. Then slowly return to the upright position. Keeping your head and back in a straight line, bend at your waist to reach forward (position B). You should feel a stretch behind both of your thighs and knees (hamstrings). Hold this position for __________ seconds. Then slowly return to the upright position. Keeping your head and back in a straight line, bend at your waist to reach for your right foot (position C). You should feel a stretch in your left inner thigh (adductors). Hold this position for __________ seconds. Then slowly return to the upright position. Repeat __________ times. Complete this exercise __________ times a day. Lunge This exercise stretches the muscles of the hip (hip flexors). Place your left / right knee on the floor and bend your other knee so that is directly over your ankle. You should be half-kneeling. Keep good posture with your head over your shoulders. Tighten your buttocks to point your tailbone downward. This will prevent your back from arching too much. You should feel a gentle stretch in the front of your left / right thigh and hip. If you do not feel a stretch, slide your other foot   forward slightly and then slowly lunge forward with your chest up until your knee once again lines up over your ankle. Make sure your tailbone continues to point downward. Hold this position for __________ seconds. Slowly return to the starting position. Repeat __________ times. Complete this exercise __________ times a day. Strengthening exercises These exercises build strength and endurance  in your hip. Endurance is the ability to use your muscles for a long time, even after they get tired. Bridge This exercise strengthens the muscles of your hip (hip extensors). Lie on your back on a firm surface with your knees bent and your feet flat on the floor. Tighten your buttocks muscles and lift your bottom off the floor until the trunk of your body and your hips are level with your thighs. Do not arch your back. You should feel the muscles working in your buttocks and the back of your thighs. If you do not feel these muscles, slide your feet 1-2 inches (2.5-5 cm) farther away from your buttocks. Hold this position for __________ seconds. Slowly lower your hips to the starting position. Let your muscles relax completely between repetitions. Repeat __________ times. Complete this exercise __________ times a day. Straight leg raises, side-lying This exercise strengthens the muscles that move the hip joint away from the center of the body (hip abductors). Lie on your side with your left / right leg in the top position. Lie so your head, shoulder, hip, and knee line up. You may bend your bottom knee slightly to help you balance. Roll your hips slightly forward, so your hips are stacked directly over each other and your left / right knee is facing forward. Leading with your heel, lift your top leg 4-6 inches (10-15 cm). You should feel the muscles in your top hip lifting. Do not let your foot drift forward. Do not let your knee roll toward the ceiling. Hold this position for __________ seconds. Slowly return to the starting position. Let your muscles relax completely between repetitions. Repeat __________ times. Complete this exercise __________ times a day. Straight leg raises, side-lying This exercise strengthens the muscles that move the hip joint toward the center of the body (hip adductors). Lie on your side with your left / right leg in the bottom position. Lie so your head, shoulder,  hip, and knee line up. You may place your upper foot in front to help you balance. Roll your hips slightly forward, so your hips are stacked directly over each other and your left / right knee is facing forward. Tense the muscles in your inner thigh and lift your bottom leg 4-6 inches (10-15 cm). Hold this position for __________ seconds. Slowly return to the starting position. Let your muscles relax completely between repetitions. Repeat __________ times. Complete this exercise __________ times a day. Straight leg raises, supine This exercise strengthens the muscles in the front of your thigh (quadriceps). Lie on your back (supine position) with your left / right leg extended and your other knee bent. Tense the muscles in the front of your left / right thigh. You should see your kneecap slide up or see increased dimpling just above your knee. Keep these muscles tight as you raise your leg 4-6 inches (10-15 cm) off the floor. Do not let your knee bend. Hold this position for __________ seconds. Keep these muscles tense as you lower your leg. Relax the muscles slowly and completely between repetitions. Repeat __________ times. Complete this exercise __________ times a day. Hip abductors, standing This   exercise strengthens the muscles that move the leg and hip joint away from the center of the body (hip abductors). Tie one end of a rubber exercise band or tubing to a secure surface, such as a chair, table, or pole. Loop the other end of the band or tubing around your left / right ankle. Keeping your ankle with the band or tubing directly opposite the secured end, step away until there is tension in the tubing or band. Hold on to a chair, table, or pole as needed for balance. Lift your left / right leg out to your side. While you do this: Keep your back upright. Keep your shoulders over your hips. Keep your toes pointing forward. Make sure to use your hip muscles to slowly lift your leg. Do not  tip your body or forcefully lift your leg. Hold this position for __________ seconds. Slowly return to the starting position. Repeat __________ times. Complete this exercise __________ times a day. Squats This exercise strengthens the muscles in the front of your thigh (quadriceps). Stand in a door frame so your feet and knees are in line with the frame. You may place your hands on the frame for balance. Slowly bend your knees and lower your hips like you are going to sit in a chair. Keep your lower legs in a straight-up-and-down position. Do not let your hips go lower than your knees. Do not bend your knees lower than told by your health care provider. If your hip pain increases, do not bend as low. Hold this position for ___________ seconds. Slowly push with your legs to return to standing. Do not use your hands to pull yourself to standing. Repeat __________ times. Complete this exercise __________ times a day. This information is not intended to replace advice given to you by your health care provider. Make sure you discuss any questions you have with your health care provider. Document Revised: 03/09/2021 Document Reviewed: 03/09/2021 Elsevier Patient Education  2023 [employer]   

## 2022-06-15 NOTE — Progress Notes (Signed)
Subjective:    Patient ID: Pamela Ball, female    DOB: [birth year], 76 y.o.   MRN: [id number]  HPI  Pt presents to the clinic today with complaint of right hip pain.  This started 1-2 weeks ago after a fall.  She describes the pain as sharp. The pain is worse with ambulating. She denies numbness and tingling of her right lower extremity but she does report some weakness. She has tried Hydrocodone with minimal relief of symptoms.  Review of Systems  Past Medical History:  Diagnosis Date   Abdominal hernia with obstruction and without gangrene    Anxiety 02/15/2017   denies   Asthma    Bipolar 1 disorder, depressed, full remission ([place]) 02/15/2017   Bipolar affective disorder (HCC)    COPD (chronic obstructive pulmonary disease) (HCC)    Depression    GERD (gastroesophageal reflux disease) 02/15/2017   Glaucoma    Headache    Hiatal hernia    History of abdominal hernia 05/21/2017   History of compression fracture of spine 02/15/2017   Hyperlipidemia    Hypertension    Incisional hernia    Incisional ventral hernia w obstruction 06/02/2017   Normocytic anemia 05/23/2017   Osteoarthritis of knees, 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   Presbycusis of both ears 02/15/2017    Current Outpatient Medications  Medication Sig Dispense Refill   albuterol (VENTOLIN HFA) 108 (90 Base) MCG/ACT inhaler INHALE 2 PUFFS INTO THE LUNGS EVERY 4 HOURS AS NEEDED FOR WHEEZING OR SHORTNESS OF BREATH (COUGH). 8.5 each 1   ALPRAZolam (XANAX) 0.5 MG tablet Take 1 tablet (0.5 mg total) by mouth 2 (two) times daily as needed for anxiety. 30 tablet 0   amLODipine (NORVASC) 10 MG tablet TAKE 1 TABLET BY MOUTH EVERY DAY 90 tablet 3   Ascorbic Acid (VITAMIN C PO) Take 1 tablet by mouth daily.     aspirin EC 325 MG EC tablet Take 1 tablet (325 mg total) by mouth daily. 30 tablet 0   Aspirin-Caffeine (BC FAST PAIN RELIEF PO) Take 1 packet by mouth 4 (four) times daily as needed (pain).     buPROPion (WELLBUTRIN XL) 150 MG 24  hr tablet Take 150 mg by mouth daily.     busPIRone (BUSPAR) 10 MG tablet Take 10 mg by mouth daily. Prescribed by Psychiatry Dr Kasandra Knudsen     Calcium-Vitamin D-Vitamin K ([facility] CALCIUM SOFT CHEWS PO) Take by mouth.     Cyanocobalamin (B-12 PO) Take 1 capsule by mouth daily.     docusate sodium (COLACE) 100 MG capsule Take 1 capsule (100 mg total) by mouth 2 (two) times daily. (Patient taking differently: Take 100 mg by mouth daily.) 10 capsule 0   feeding supplement (ENSURE ENLIVE / ENSURE PLUS) LIQD Take 237 mLs by mouth 3 (three) times daily between meals. 237 mL 12   furosemide (LASIX) 20 MG tablet TAKE 1 TABLET (20 MG TOTAL) BY MOUTH DAILY AS NEEDED FOR FLUID OR EDEMA. 90 tablet 1   HYDROcodone-acetaminophen (NORCO/VICODIN) 5-325 MG tablet Take 1 tablet by mouth every 6 (six) hours as needed for moderate pain. 60 tablet 0   Multiple Vitamin (MULTIVITAMIN WITH MINERALS) TABS tablet Take 1 tablet by mouth daily.     Multiple Vitamins-Minerals (PRESERVISION AREDS PO) Take 1 tablet by mouth daily.     mupirocin ointment (BACTROBAN) 2 % Apply 1 application. topically 2 (two) times daily. For 7-10 days 30 g 0   Nutritional Supplements (CARNATION BREAKFAST ESSENTIALS PO) Take 1 Dose  by mouth daily.     OLANZapine (ZYPREXA) 7.5 MG tablet Take 7.5 mg by mouth daily.     omeprazole (PRILOSEC) 20 MG capsule TAKE 1 CAPSULE BY MOUTH EVERY DAY BEFORE BREAKFAST 90 capsule 1   ondansetron (ZOFRAN-ODT) 4 MG disintegrating tablet TAKE 1 TABLET BY MOUTH EVERY 8 HOURS AS NEEDED FOR NAUSEA AND VOMITING 30 tablet 2   simvastatin (ZOCOR) 20 MG tablet Take 1 tablet (20 mg total) by mouth daily. 90 tablet 1   venlafaxine XR (EFFEXOR-XR) 75 MG 24 hr capsule Take 75 mg by mouth daily with breakfast.      vitamin B-12 1000 MCG tablet Take 3 tablets (3,000 mcg total) by mouth daily.     VITAMIN D PO Take 1 capsule by mouth daily.     No current facility-administered medications for this visit.    Allergies  Allergen  Reactions   Tape Other (See Comments)    Pulls skin off    Family History  Problem Relation Age of Onset   Cancer Mother        cancer   Breast cancer Mother 40   Hypertension Father     Social History   Socioeconomic History   Marital status: Married    Spouse name: Dorreen Valiente   Number of children: Not on file   Years of education: High School   Highest education level: Not on file  Occupational History   Occupation: retired  Tobacco Use   Smoking status: Never   Smokeless tobacco: Never  Vaping Use   Vaping Use: Never used  Substance and Sexual Activity   Alcohol use: No    Comment: drink occasionally    Drug use: No   Sexual activity: Not Currently  Other Topics Concern   Not on file  Social History Narrative   Not on file   Social Determinants of Health   Financial Resource Strain: Low Risk  (02/09/2022)   Overall Financial Resource Strain (CARDIA)    Difficulty of Paying Living Expenses: Not hard at all  Food Insecurity: No Food Insecurity (02/09/2022)   Hunger Vital Sign    Worried About Running Out of Food in the Last Year: Never true    [place] in the Last Year: Never true  Transportation Needs: No Transportation Needs (02/16/2022)   PRAPARE - Hydrologist (Medical): No    Lack of Transportation (Non-Medical): No  Physical Activity: Insufficiently Active (02/16/2022)   Exercise Vital Sign    Days of Exercise per Week: 7 days    Minutes of Exercise per Session: 20 min  Stress: No Stress Concern Present (02/09/2022)   [place]    Feeling of Stress : Not at all  Social Connections: Moderately Integrated (02/09/2022)   Social Connection and Isolation Panel [NHANES]    Frequency of Communication with Friends and Family: More than three times a week    Frequency of Social Gatherings with Friends and Family: More than three times a week    Attends Religious  Services: More than 4 times per year    Active Member of [employer] or Organizations: No    Attends Archivist Meetings: Never    Marital Status: Married  Human resources officer Violence: Not At Risk (02/09/2022)   Humiliation, Afraid, Rape, and Kick questionnaire    Fear of Current or Ex-Partner: No    Emotionally Abused: No    Physically Abused: No  Sexually Abused: No     Constitutional: Denies fever, malaise, fatigue, headache or abrupt weight changes.  Respiratory: Denies difficulty breathing, shortness of breath, cough or sputum production.   Cardiovascular: Denies chest pain, chest tightness, palpitations or swelling in the hands or feet.  Musculoskeletal: Patient reports right hip pain, difficulty with gait.  Denies decrease in range of motion, muscle pain or joint swelling.  Skin: Denies redness, rashes, lesions or ulcercations.  Neurological: Patient reports weakness in RLE denies numbness, tingling or problems with balance and coordination.    No other specific complaints in a complete review of systems (except as listed in HPI above).     Objective:   Physical Exam  BP 106/60 (BP Location: Right Arm, Patient Position: Sitting, Cuff Size: Normal)   Pulse (!) 102   Temp (!) 97.5 F (36.4 C) (Tympanic)   Wt 90 lb (40.8 kg)   SpO2 93%   BMI 18.81 kg/m   Wt Readings from Last 3 Encounters:  05/25/22 91 lb 3.2 oz (41.4 kg)  01/16/22 97 lb (44 kg)  12/23/21 97 lb (44 kg)    General: Appears her stated age, chronically ill-appearing, in NAD. Cardiovascular: Tachycardic with normal rhythm.  Pulmonary/Chest: Normal effort and positive vesicular breath sounds. No respiratory distress. No wheezes, rales or ronchi noted.  Musculoskeletal: Normal flexion of the right hip.  Pain with extension of the right hip.  Pain with internal rotation of the right hip.  Normal external rotation of the right hip.  Pain with palpation of the right trochanter and proximal femur.  She is  able to bear weight but limping and very unsteady even with assistance. Neurological: Alert and oriented.  Sensation intact to RLE.  BMET    Component Value Date/Time   NA 137 12/24/2021 0446   NA 138 02/08/2015 1724   K 3.9 12/24/2021 0446   K 3.7 02/08/2015 1724   CL 107 12/24/2021 0446   CL 101 02/08/2015 1724   CO2 26 12/24/2021 0446   CO2 28 02/08/2015 1724   GLUCOSE 109 (H) 12/24/2021 0446   GLUCOSE 113 (H) 02/08/2015 1724   BUN 15 12/24/2021 0446   BUN 9 02/08/2015 1724   CREATININE 0.72 12/24/2021 0446   CREATININE 0.59 (L) 06/28/2020 0927   CALCIUM 7.8 (L) 12/24/2021 0446   CALCIUM 9.2 02/08/2015 1724   GFRNONAA >60 12/24/2021 0446   GFRNONAA 90 06/28/2020 0927   GFRAA 105 06/28/2020 0927    Lipid Panel     Component Value Date/Time   CHOL 215 (H) 06/28/2020 0927   TRIG 125 06/28/2020 0927   HDL 77 06/28/2020 0927   CHOLHDL 2.8 06/28/2020 0927   VLDL 14 09/26/2018 1249   LDLCALC 114 (H) 06/28/2020 0927    CBC    Component Value Date/Time   WBC 7.1 12/24/2021 0446   RBC 2.84 (L) 12/24/2021 0446   HGB 8.9 (L) 12/24/2021 0446   HGB 14.1 02/08/2015 1724   HCT 27.6 (L) 12/24/2021 0446   HCT 42.3 02/08/2015 1724   PLT 217 12/24/2021 0446   PLT 387 02/08/2015 1724   MCV 97.2 12/24/2021 0446   MCV 98 02/08/2015 1724   MCH 31.3 12/24/2021 0446   MCHC 32.2 12/24/2021 0446   RDW 14.6 12/24/2021 0446   RDW 15.1 (H) 02/08/2015 1724   LYMPHSABS 1.7 12/05/2021 1041   LYMPHSABS 0.8 (L) 02/08/2015 1724   MONOABS 0.5 12/05/2021 1041   MONOABS 0.9 02/08/2015 1724   EOSABS 0.1 12/05/2021 1041  EOSABS 0.1 02/08/2015 1724   BASOSABS 0.0 12/05/2021 1041   BASOSABS 0.1 02/08/2015 1724    Hgb A1C Lab Results  Component Value Date   HGBA1C 5.1 06/28/2020           Assessment & Plan:   Right hip pain status post fall:  X-ray right hip today Continue Hydrocodone as needed for pain management Advised her not to walk without assistance or without her  walker  Will follow-up after imaging with further recommendation and treatment plan  Webb Silversmith, NP

## 2022-06-16 ENCOUNTER — Other Ambulatory Visit: Payer: Self-pay

## 2022-06-16 DIAGNOSIS — Z96651 Presence of right artificial knee joint: Secondary | ICD-10-CM

## 2022-06-16 DIAGNOSIS — M170 Bilateral primary osteoarthritis of knee: Secondary | ICD-10-CM

## 2022-06-16 DIAGNOSIS — R591 Generalized enlarged lymph nodes: Secondary | ICD-10-CM

## 2022-06-16 DIAGNOSIS — G8929 Other chronic pain: Secondary | ICD-10-CM

## 2022-06-16 MED ORDER — HYDROCODONE-ACETAMINOPHEN 5-325 MG PO TABS: 1.0000 | ORAL_TABLET | Freq: Four times a day (QID) | ORAL | 0 refills | Status: DC | PRN

## 2022-06-20 ENCOUNTER — Other Ambulatory Visit: Payer: Self-pay

## 2022-06-20 DIAGNOSIS — T8484XD Pain due to internal orthopedic prosthetic devices, implants and grafts, subsequent encounter: Secondary | ICD-10-CM | POA: Diagnosis not present

## 2022-06-20 DIAGNOSIS — I10 Essential (primary) hypertension: Secondary | ICD-10-CM | POA: Diagnosis not present

## 2022-06-20 DIAGNOSIS — D472 Monoclonal gammopathy: Secondary | ICD-10-CM | POA: Diagnosis not present

## 2022-06-20 DIAGNOSIS — I252 Old myocardial infarction: Secondary | ICD-10-CM | POA: Diagnosis not present

## 2022-06-20 DIAGNOSIS — R110 Nausea: Secondary | ICD-10-CM

## 2022-06-20 DIAGNOSIS — Z87310 Personal history of (healed) osteoporosis fracture: Secondary | ICD-10-CM | POA: Diagnosis not present

## 2022-06-20 DIAGNOSIS — J432 Centrilobular emphysema: Secondary | ICD-10-CM | POA: Diagnosis not present

## 2022-06-20 DIAGNOSIS — E785 Hyperlipidemia, unspecified: Secondary | ICD-10-CM | POA: Diagnosis not present

## 2022-06-20 DIAGNOSIS — K219 Gastro-esophageal reflux disease without esophagitis: Secondary | ICD-10-CM | POA: Diagnosis not present

## 2022-06-20 DIAGNOSIS — M1652 Unilateral post-traumatic osteoarthritis, left hip: Secondary | ICD-10-CM | POA: Diagnosis not present

## 2022-06-20 DIAGNOSIS — W19XXXD Unspecified fall, subsequent encounter: Secondary | ICD-10-CM | POA: Diagnosis not present

## 2022-06-20 DIAGNOSIS — Z8673 Personal history of transient ischemic attack (TIA), and cerebral infarction without residual deficits: Secondary | ICD-10-CM | POA: Diagnosis not present

## 2022-06-20 DIAGNOSIS — Z7982 Long term (current) use of aspirin: Secondary | ICD-10-CM | POA: Diagnosis not present

## 2022-06-20 DIAGNOSIS — Z79899 Other long term (current) drug therapy: Secondary | ICD-10-CM | POA: Diagnosis not present

## 2022-06-20 DIAGNOSIS — Z96612 Presence of left artificial shoulder joint: Secondary | ICD-10-CM | POA: Diagnosis not present

## 2022-06-20 DIAGNOSIS — M1712 Unilateral primary osteoarthritis, left knee: Secondary | ICD-10-CM | POA: Diagnosis not present

## 2022-06-20 DIAGNOSIS — Z79891 Long term (current) use of opiate analgesic: Secondary | ICD-10-CM | POA: Diagnosis not present

## 2022-06-20 DIAGNOSIS — M80052S Age-related osteoporosis with current pathological fracture, left femur, sequela: Secondary | ICD-10-CM | POA: Diagnosis not present

## 2022-06-20 MED ORDER — ONDANSETRON 4 MG PO TBDP: ORAL_TABLET | ORAL | 2 refills | Status: DC

## 2022-06-23 DIAGNOSIS — W1839XD Other fall on same level, subsequent encounter: Secondary | ICD-10-CM | POA: Diagnosis not present

## 2022-06-23 DIAGNOSIS — S42292P Other displaced fracture of upper end of left humerus, subsequent encounter for fracture with malunion: Secondary | ICD-10-CM | POA: Diagnosis not present

## 2022-06-23 DIAGNOSIS — W010XXD Fall on same level from slipping, tripping and stumbling without subsequent striking against object, subsequent encounter: Secondary | ICD-10-CM | POA: Diagnosis not present

## 2022-06-23 DIAGNOSIS — Z96611 Presence of right artificial shoulder joint: Secondary | ICD-10-CM | POA: Diagnosis not present

## 2022-06-23 DIAGNOSIS — S42352D Displaced comminuted fracture of shaft of humerus, left arm, subsequent encounter for fracture with routine healing: Secondary | ICD-10-CM | POA: Diagnosis not present

## 2022-06-27 ENCOUNTER — Telehealth: Payer: Self-pay

## 2022-06-27 NOTE — Telephone Encounter (Signed)
Can you clarify. Are they asking for a continuation of PT services? Or do they need a new order for PT?  I am not quite sure what they mean by PCP Aware.  Thanks  [name], [facility] 06/27/2022, 8:43 AM

## 2022-06-27 NOTE — Telephone Encounter (Signed)
Copied from [place] [phone]. Topic: General - Other >> Jun 27, 2022  8:02 AM Gaetano Hawthorne wrote: Caller requesting referral for PT home health services through [facility].Patient needs therapy to assist with walking. Caller states PCP is aware.

## 2022-06-29 ENCOUNTER — Ambulatory Visit: Payer: Self-pay

## 2022-06-29 ENCOUNTER — Telehealth: Payer: Medicare Other

## 2022-06-29 NOTE — Patient Outreach (Signed)
  Care Coordination   06/29/2022 Name: Pamela Ball MRN: [id number] DOB: 12-14-1945   Care Coordination Outreach Attempts:  An unsuccessful telephone outreach was attempted today to offer the patient information about available care coordination services as a benefit of their health plan.   Follow Up Plan:  Additional outreach attempts will be made to offer the patient care coordination information and services.   Encounter Outcome:  No Answer  Care Coordination Interventions Activated:  No   Care Coordination Interventions:  No, not indicated    Noreene Larsson RN, MSN, CCM Community Care Coordinator [facility] Mobile: [phone]

## 2022-07-11 ENCOUNTER — Other Ambulatory Visit: Payer: Self-pay | Admitting: Family Medicine

## 2022-07-11 DIAGNOSIS — R591 Generalized enlarged lymph nodes: Secondary | ICD-10-CM

## 2022-07-11 DIAGNOSIS — Z96651 Presence of right artificial knee joint: Secondary | ICD-10-CM

## 2022-07-11 DIAGNOSIS — G8929 Other chronic pain: Secondary | ICD-10-CM

## 2022-07-11 DIAGNOSIS — M170 Bilateral primary osteoarthritis of knee: Secondary | ICD-10-CM

## 2022-07-11 NOTE — Telephone Encounter (Signed)
Mdication refill: HYDROcodone-acetaminophen (NORCO/VICODIN) 5-325 MG tablet [[id number]]    Pharmacy:  [id number] [place] Phone:  [phone] Fax:  [phone]     Last visit : 06/15/22

## 2022-07-12 MED ORDER — HYDROCODONE-ACETAMINOPHEN 5-325 MG PO TABS: 1.0000 | ORAL_TABLET | Freq: Four times a day (QID) | ORAL | 0 refills | Status: DC | PRN

## 2022-07-12 NOTE — Telephone Encounter (Signed)
Requested medications are due for refill today.  yes  Requested medications are on the active medications list.  yes  Last refill. 06/16/2022 #60 0 refills  Future visit scheduled.   no  Notes to clinic.  Refill not delegated.    Requested Prescriptions  Pending Prescriptions Disp Refills   HYDROcodone-acetaminophen (NORCO/VICODIN) 5-325 MG tablet 60 tablet 0    Sig: Take 1 tablet by mouth every 6 (six) hours as needed for moderate pain.     Not Delegated - Analgesics:  Opioid Agonist Combinations Failed - 07/11/2022  2:04 PM      Failed - This refill cannot be delegated      Failed - Urine Drug Screen completed in last 360 days      Passed - Valid encounter within last 3 months    Recent Outpatient Visits           3 weeks ago Hip pain, acute, right   [facility] [place], Coralie Keens, NP   1 month ago Primary osteoarthritis of both knees   [place], DO   5 months ago Left arm pain   [place], DO   7 months ago Venous insufficiency of both lower extremities   [place], DO   8 months ago Chronic left shoulder pain   [facility] [place], Coralie Keens, [place]

## 2022-07-20 ENCOUNTER — Other Ambulatory Visit: Payer: Self-pay | Admitting: Family Medicine

## 2022-07-20 DIAGNOSIS — E782 Mixed hyperlipidemia: Secondary | ICD-10-CM

## 2022-07-20 NOTE — Telephone Encounter (Signed)
Requested medication (s) are due for refill today: yes  Requested medication (s) are on the active medication list: yes  Last refill:  08/15/21 #90 1 refills  Future visit scheduled: yes in 1 week  Notes to clinic:  protocol failed. Last labs 06/28/20. Do you want to refill Rx?     Requested Prescriptions  Pending Prescriptions Disp Refills   simvastatin (ZOCOR) 20 MG tablet [Pharmacy Med Name: SIMVASTATIN 20 MG TABLET] 90 tablet 1    Sig: TAKE 1 TABLET BY MOUTH EVERY DAY     Cardiovascular:  Antilipid - Statins Failed - 07/20/2022  2:46 AM      Failed - Lipid Panel in normal range within the last 12 months    Cholesterol  Date Value Ref Range Status  06/28/2020 215 (H) <200 mg/dL Final   LDL Cholesterol (Calc)  Date Value Ref Range Status  06/28/2020 114 (H) mg/dL (calc) Final    Comment:    Reference range: <100 . Desirable range <100 mg/dL for primary prevention;   <70 mg/dL for patients with CHD or diabetic patients  with > or = 2 CHD risk factors. Marland Kitchen LDL-C is now calculated using the Martin-Hopkins  calculation, which is a validated novel method providing  better accuracy than the Friedewald equation in the  estimation of LDL-C.  Cresenciano Genre et al. Annamaria Helling. 7824;235(36): 2061-2068  (http://education.[facility].com/faq/FAQ164)    HDL  Date Value Ref Range Status  06/28/2020 77 > OR = 50 mg/dL Final   Triglycerides  Date Value Ref Range Status  06/28/2020 125 <150 mg/dL Final         Passed - Patient is not pregnant      Passed - Valid encounter within last 12 months    Recent Outpatient Visits           1 month ago Hip pain, acute, right   [facility] [place], Coralie Keens, NP   1 month ago Primary osteoarthritis of both knees   [place], DO   6 months ago Left arm pain   [place], DO   7 months ago Venous insufficiency of both lower extremities   [place], DO   8 months ago Chronic left shoulder pain   [facility] [place], Coralie Keens, NP       Future Appointments             In 1 week [place], Devonne Doughty, DO [facility], [facility]

## 2022-07-26 DIAGNOSIS — Z789 Other specified health status: Secondary | ICD-10-CM | POA: Insufficient documentation

## 2022-07-26 DIAGNOSIS — Z79899 Other long term (current) drug therapy: Secondary | ICD-10-CM | POA: Insufficient documentation

## 2022-07-26 DIAGNOSIS — G894 Chronic pain syndrome: Secondary | ICD-10-CM | POA: Insufficient documentation

## 2022-07-26 DIAGNOSIS — M899 Disorder of bone, unspecified: Secondary | ICD-10-CM | POA: Insufficient documentation

## 2022-07-26 NOTE — Progress Notes (Unsigned)
Patient: RAJNI HOLSWORTH  Service Category: E/M  Provider: Gaspar Cola, MD  DOB: 1946/02/08  DOS: 07/27/2022  Referring Provider: [name] *  MRN: [id number]  Setting: Ambulatory outpatient  PCP: Olin Hauser, DO  Type: New Patient  Specialty: Interventional Pain Management    Location: Office  Delivery: Face-to-face     Primary Reason(s) for Visit: Encounter for initial evaluation of one or more chronic problems (new to examiner) potentially causing chronic pain, and posing a threat to normal musculoskeletal function. (Level of risk: High) CC: No chief complaint on file.  HPI  Ms. Millon is a 76 y.o. year old, female patient, who comes for the first time to our practice referred by [name] * for our initial evaluation of her chronic pain. She has Bipolar 1 disorder, depressed, full remission ([place]); GERD (gastroesophageal reflux disease); Osteoarthritis of knees, bilateral; Hyperlipidemia; Anxiety; Normocytic anemia; Allergic rhinitis due to allergen; COPD (chronic obstructive pulmonary disease) ([place]); Malnutrition of moderate degree; Essential hypertension; Recurrent falls; Venous insufficiency of both lower extremities; Acute blood loss anemia; Bipolar and related disorder ([place]); Blindness, legal; Closed displaced comminuted fracture of shaft of left humerus; Erythrocytosis; History of CVA (cerebrovascular accident); History of non-ST elevation myocardial infarction (NSTEMI); Left acetabular fracture ([place]); MGUS (monoclonal gammopathy of unknown significance); Osteoporosis; S/P reverse total shoulder arthroplasty, right; Fall; Closed 4-part fracture of proximal end of left humerus with malunion; Chronic pain syndrome; Pharmacologic therapy; Disorder of skeletal system; and Problems influencing health status on their problem list. Today she comes in for evaluation of her No chief complaint on file.  Pain Assessment: Location:     Radiating:   Onset:    Duration:   Quality:   Severity:  /10 (subjective, self-reported pain score)  Effect on ADL:   Timing:   Modifying factors:   BP:    HR:    Onset and Duration: {Hx; Onset and Duration:210120511} Cause of pain: {Hx; Cause:210120521} Severity: {Pain Severity:210120502} Timing: {Symptoms; Timing:210120501} Aggravating Factors: {Causes; Aggravating pain factors:210120507} Alleviating Factors: {Causes; Alleviating Factors:210120500} Associated Problems: {Hx; Associated problems:210120515} Quality of Pain: {Hx; Symptom quality or Descriptor:210120531} Previous Examinations or Tests: {Hx; Previous examinations or test:210120529} Previous Treatments: {Hx; Previous Treatment:210120503}  ***  Today I took the time to provide the patient with information regarding my pain practice. The patient was informed that my practice is divided into two sections: an interventional pain management section, as well as a completely separate and distinct medication management section. I explained that I have procedure days for my interventional therapies, and evaluation days for follow-ups and medication management. Because of the amount of documentation required during both, they are kept separated. This means that there is the possibility that she may be scheduled for a procedure on one day, and medication management the next. I have also informed her that because of staffing and facility limitations, I no longer take patients for medication management only. To illustrate the reasons for this, I gave the patient the example of surgeons, and how inappropriate it would be to refer a patient to his/her care, just to write for the post-surgical antibiotics on a surgery done by a different surgeon.   Because interventional pain management is my board-certified specialty, the patient was informed that joining my practice means that they are open to any and all interventional therapies. I made it clear that this does not  mean that they will be forced to have any procedures done. What this means is that I believe interventional therapies  to be essential part of the diagnosis and proper management of chronic pain conditions. Therefore, patients not interested in these interventional alternatives will be better served under the care of a different practitioner.  The patient was also made aware of my Comprehensive Pain Management Safety Guidelines where by joining my practice, they limit all of their nerve blocks and joint injections to those done by our practice, for as long as we are retained to manage their care.   Historic Controlled Substance Pharmacotherapy Review  PMP and historical list of controlled substances: Hydrocodone/APAP 5/325, 1 tab p.o. 4 times daily (# 60) (last filled on 07/12/2022); hydrocodone/APAP 10/325, 1 tab p.o. 4 times daily (# 18) (last filled on 05/12/2022); oxycodone IR 5 mg tablet, 1 tab p.o. every 4 hours (# 30) (last filled on 03/28/2022) Current opioid analgesics: Hydrocodone/APAP 5/325, 1 tab p.o. 4 times daily (# 60) (last filled on 07/12/2022) MME/day: 20 mg/day  Historical Monitoring: The patient  reports no history of drug use. List of prior UDS Testing: No results found for: "MDMA", "COCAINSCRNUR", "PCPSCRNUR", "PCPQUANT", "CANNABQUANT", "THCU", "ETH", "CBDTHCR", "D8THCCBX", "D9THCCBX" Historical Background Evaluation: [place] PMP: PDMP reviewed during this encounter. Review of the past 25-month conducted.             PMP NARX Score Report:  Narcotic: 441 Sedative: 280 Stimulant: 000 [place] Department of public safety, offender search: (Editor, commissioningInformation) Non-contributory Risk Assessment Profile: Aberrant behavior: None observed or detected today Risk factors for fatal opioid overdose: None identified today PMP NARX Overdose Risk Score: 440 Fatal overdose hazard ratio (HR): Calculation deferred Non-fatal overdose hazard ratio (HR): Calculation deferred Risk of opioid abuse or  dependence: 0.7-3.0% with doses ? 36 MME/day and 6.1-26% with doses ? 120 MME/day. Substance use disorder (SUD) risk level: See below Personal History of Substance Abuse (SUD-Substance use disorder):  Alcohol:    Illegal Drugs:    Rx Drugs:    ORT Risk Level calculation:    ORT Scoring interpretation table:  Score <3 = Low Risk for SUD  Score between 4-7 = Moderate Risk for SUD  Score >8 = High Risk for Opioid Abuse   PHQ-2 Depression Scale:  Total score:    PHQ-2 Scoring interpretation table: (Score and probability of major depressive disorder)  Score 0 = No depression  Score 1 = 15.4% Probability  Score 2 = 21.1% Probability  Score 3 = 38.4% Probability  Score 4 = 45.5% Probability  Score 5 = 56.4% Probability  Score 6 = 78.6% Probability   PHQ-9 Depression Scale:  Total score:    PHQ-9 Scoring interpretation table:  Score 0-4 = No depression  Score 5-9 = Mild depression  Score 10-14 = Moderate depression  Score 15-19 = Moderately severe depression  Score 20-27 = Severe depression (2.4 times higher risk of SUD and 2.89 times higher risk of overuse)   Pharmacologic Plan: As per protocol, I have not taken over any controlled substance management, pending the results of ordered tests and/or consults.            Initial impression: Pending review of available data and ordered tests.  Meds   Current Outpatient Medications:    albuterol (VENTOLIN HFA) 108 (90 Base) MCG/ACT inhaler, INHALE 2 PUFFS INTO THE LUNGS EVERY 4 HOURS AS NEEDED FOR WHEEZING OR SHORTNESS OF BREATH (COUGH)., Disp: 8.5 each, Rfl: 1   ALPRAZolam (XANAX) 0.5 MG tablet, Take 1 tablet (0.5 mg total) by mouth 2 (two) times daily as needed for anxiety., Disp: 30  tablet, Rfl: 0   amLODipine (NORVASC) 10 MG tablet, TAKE 1 TABLET BY MOUTH EVERY DAY, Disp: 90 tablet, Rfl: 3   Ascorbic Acid (VITAMIN C PO), Take 1 tablet by mouth daily., Disp: , Rfl:    aspirin EC 325 MG EC tablet, Take 1 tablet (325 mg total) by mouth  daily. (Patient not taking: Reported on 06/15/2022), Disp: 30 tablet, Rfl: 0   Aspirin-Caffeine (BC FAST PAIN RELIEF PO), Take 1 packet by mouth 4 (four) times daily as needed (pain)., Disp: , Rfl:    buPROPion (WELLBUTRIN XL) 150 MG 24 hr tablet, Take 150 mg by mouth daily., Disp: , Rfl:    busPIRone (BUSPAR) 10 MG tablet, Take 10 mg by mouth daily. Prescribed by Psychiatry Dr Kasandra Knudsen, Disp: , Rfl:    Calcium-Vitamin D-Vitamin K ([employer] CALCIUM SOFT CHEWS PO), Take by mouth., Disp: , Rfl:    Cyanocobalamin (B-12 PO), Take 1 capsule by mouth daily., Disp: , Rfl:    docusate sodium (COLACE) 100 MG capsule, Take 1 capsule (100 mg total) by mouth 2 (two) times daily. (Patient taking differently: Take 100 mg by mouth daily.), Disp: 10 capsule, Rfl: 0   feeding supplement (ENSURE ENLIVE / ENSURE PLUS) LIQD, Take 237 mLs by mouth 3 (three) times daily between meals., Disp: 237 mL, Rfl: 12   furosemide (LASIX) 20 MG tablet, TAKE 1 TABLET (20 MG TOTAL) BY MOUTH DAILY AS NEEDED FOR FLUID OR EDEMA., Disp: 90 tablet, Rfl: 1   HYDROcodone-acetaminophen (NORCO/VICODIN) 5-325 MG tablet, Take 1 tablet by mouth every 6 (six) hours as needed for moderate pain., Disp: 60 tablet, Rfl: 0   Multiple Vitamin (MULTIVITAMIN WITH MINERALS) TABS tablet, Take 1 tablet by mouth daily., Disp: , Rfl:    Multiple Vitamins-Minerals (PRESERVISION AREDS PO), Take 1 tablet by mouth daily., Disp: , Rfl:    mupirocin ointment (BACTROBAN) 2 %, Apply 1 application. topically 2 (two) times daily. For 7-10 days, Disp: 30 g, Rfl: 0   Nutritional Supplements (CARNATION BREAKFAST ESSENTIALS PO), Take 1 Dose by mouth daily., Disp: , Rfl:    OLANZapine (ZYPREXA) 7.5 MG tablet, Take 7.5 mg by mouth daily., Disp: , Rfl:    omeprazole (PRILOSEC) 20 MG capsule, TAKE 1 CAPSULE BY MOUTH EVERY DAY BEFORE BREAKFAST, Disp: 90 capsule, Rfl: 1   ondansetron (ZOFRAN-ODT) 4 MG disintegrating tablet, TAKE 1 TABLET BY MOUTH EVERY 8 HOURS AS NEEDED FOR NAUSEA AND  VOMITING, Disp: 30 tablet, Rfl: 2   simvastatin (ZOCOR) 20 MG tablet, TAKE 1 TABLET BY MOUTH EVERY DAY, Disp: 90 tablet, Rfl: 1   venlafaxine XR (EFFEXOR-XR) 75 MG 24 hr capsule, Take 75 mg by mouth daily with breakfast. , Disp: , Rfl:    vitamin B-12 1000 MCG tablet, Take 3 tablets (3,000 mcg total) by mouth daily., Disp: , Rfl:    VITAMIN D PO, Take 1 capsule by mouth daily., Disp: , Rfl:   Imaging Review  Cervical Imaging: Cervical MR wo contrast: No results found for this or any previous visit.  Cervical MR wo contrast: No valid procedures specified. Cervical MR w/wo contrast: No results found for this or any previous visit.  Cervical MR w contrast: No results found for this or any previous visit.  Cervical CT wo contrast: Results for orders placed during the hospital encounter of 06/22/21  CT Cervical Spine Wo Contrast  Narrative CLINICAL DATA:  Recent fall with headaches and neck pain, initial encounter  EXAM: CT HEAD WITHOUT CONTRAST  CT CERVICAL SPINE WITHOUT  CONTRAST  TECHNIQUE: Multidetector CT imaging of the head and cervical spine was performed following the standard protocol without intravenous contrast. Multiplanar CT image reconstructions of the cervical spine were also generated.  COMPARISON:  05/29/2020  FINDINGS: CT HEAD FINDINGS  Brain: Mild atrophic changes are noted. 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 No acute hemorrhage or acute infarct is noted.  Vascular: No hyperdense vessel or unexpected calcification.  Skull: Normal. Negative for fracture or focal lesion.  Sinuses/Orbits: Mucosal retention cyst is noted within sphenoid sinus  Other: None  CT CERVICAL SPINE FINDINGS  Alignment: Within normal limits.  Skull base and vertebrae: 7 cervical segments are well visualized. Vertebral body height is well maintained. Multi level disc space narrowing from C4-C7 is noted with osteophytic change. Facet hypertrophic  changes are noted as well. No acute fracture or acute facet abnormality is noted.  Soft tissues and spinal canal: Surrounding soft tissue structures are within normal limits.  Upper chest: Visualized lung apices are within normal limits.  Other: None  IMPRESSION: CT of the head: Mild atrophic changes are noted.  Lacunar infarct in the right basal ganglia unchanged from the prior exam  CT of the cervical spine: Multilevel degenerative change without acute abnormality.   Electronically Signed By: Inez Catalina M.D. On: 06/22/2021 23:58  Cervical CT w/wo contrast: No results found for this or any previous visit.  Cervical CT w/wo contrast: No results found for this or any previous visit.  Cervical CT w contrast: No results found for this or any previous visit.  Cervical CT outside: No results found for this or any previous visit.  Cervical DG 1 view: No results found for this or any previous visit.  Cervical DG 2-3 views: No results found for this or any previous visit.  Cervical DG F/E views: No results found for this or any previous visit.  Cervical DG 2-3 clearing views: No results found for this or any previous visit.  Cervical DG Bending/F/E views: No results found for this or any previous visit.  Cervical DG complete: No results found for this or any previous visit.  Cervical DG Myelogram views: No results found for this or any previous visit.  Cervical DG Myelogram views: No results found for this or any previous visit.  Cervical Discogram views: No results found for this or any previous visit.   Shoulder Imaging: Shoulder-R MR w contrast: No results found for this or any previous visit.  Shoulder-L MR w contrast: No results found for this or any previous visit.  Shoulder-R MR w/wo contrast: No results found for this or any previous visit.  Shoulder-L MR w/wo contrast: No results found for this or any previous visit.  Shoulder-R MR wo contrast: No  results found for this or any previous visit.  Shoulder-L MR wo contrast: No results found for this or any previous visit.  Shoulder-R CT w contrast: No results found for this or any previous visit.  Shoulder-L CT w contrast: No results found for this or any previous visit.  Shoulder-R CT w/wo contrast: No results found for this or any previous visit.  Shoulder-L CT w/wo contrast: No results found for this or any previous visit.  Shoulder-R CT wo contrast: No results found for this or any previous visit.  Shoulder-L CT wo contrast: Results for orders placed during the hospital encounter of 12/23/21  CT SHOULDER LEFT WO CONTRAST  Narrative CLINICAL DATA:  Left shoulder trauma. Fracture or humerus or scapula.  Postoperative.  EXAM: CT OF THE UPPER LEFT EXTREMITY WITHOUT CONTRAST  TECHNIQUE: Multidetector CT imaging of the upper left extremity was performed according to the standard protocol.  RADIATION DOSE REDUCTION: This exam was performed according to the departmental dose-optimization program which includes automated exposure control, adjustment of the mA and/or kV according to patient size and/or use of iterative reconstruction technique.  COMPARISON:  Left shoulder radiographs 12/23/2021, 11/02/2021; CT left shoulder 11/29/2021  FINDINGS: Bones/Joint/Cartilage  Interval placement of a ball prosthesis within the glenoid as a component of reverse total left shoulder arthroplasty. No humeral head prosthesis is seen. There is partial resection of portions of the proximal humerus following prior extensive fracture with impaction and deformity. There is a new oblique fracture versus osteotomy of the proximal humeral diaphysis with plate and screw fixation. There are cerclage wires just proximal to the plate and screws in these appear to be surrounding a linear piece of bone possibly a bone graft.  Ligaments  Suboptimally assessed by CT.  Muscles and Tendons  Mild  atrophy of the supraspinatus muscle, similar to prior.  Soft tissues  There are multiple tiny densities scattered throughout the posterosuperior glenohumeral joint and subacromial/subdeltoid bursa possibly bone fragments.  There are postsurgical changes of subcutaneous air throughout the shoulder including within the space of the serratus anterior muscle between the scapula in the ribs and around the proximal humerus and clavicle. The extent of the air within this suggests anterior space is greater than usual. Recommend clinical correlation for any complications during the surgery. No definite connection is seen between this air and the pleural space of the left lung however there is a small left pleural effusion versus atelectasis with tiny foci of air at least close to the left pleural space (axial series 7 images 87 through 91).  There are surgical skin staples throughout the anterior shoulder.  A small portion of the left upper lung is imaged there is scattered ground-glass density compatible with subsegmental atelectasis. A large sliding hiatal hernia is partially visualized, similar to prior. Moderate atherosclerotic calcifications within the descending thoracic aorta.  IMPRESSION:: IMPRESSION: 1. Interval placement of metal ball prosthesis within the glenoid as part of a reverse total shoulder arthroplasty however no plasty cup is visualized within the posttraumatic proximal humerus. 2. There is an acute fracture versus osteotomy of the proximal humeral diaphysis with fixation by plate and screws and cerclage wires surrounding a possible bone graft. 3. Postsurgical changes including subcutaneous air. There is more air in the region of the serratus anterior muscle than expected. Recommend clinical correlation for possible surgical intervention in this region that would explain the air. This air does not definitively connect through the parietal pleura into the  pleural space to definitely indicate a breech within the pleura. Recommend clinical correlation.   Electronically Signed By: Yvonne Kendall M.D. On: 12/23/2021 19:58  Shoulder-R DG Arthrogram: No results found for this or any previous visit.  Shoulder-L DG Arthrogram: No results found for this or any previous visit.  Shoulder-R DG 1 view: No results found for this or any previous visit.  Shoulder-L DG 1 view: No results found for this or any previous visit.  Shoulder-R DG: No results found for this or any previous visit.  Shoulder-L DG: Results for orders placed during the hospital encounter of 12/23/21  DG Shoulder Left  Narrative CLINICAL DATA:  Fracture left humerus  EXAM: LEFT SHOULDER - 2+ VIEW  COMPARISON:  Previous studies including the examination of 11/02/2021  FINDINGS: There is new comminuted, displaced fracture in the proximal shaft of left humerus.  IMPRESSION: New comminuted displaced fracture is seen in the proximal shaft of left humerus.   Electronically Signed By: Elmer Picker M.D. On: 12/23/2021 15:00   Thoracic Imaging: Thoracic MR wo contrast: No results found for this or any previous visit.  Thoracic MR wo contrast: No valid procedures specified. Thoracic MR w/wo contrast: No results found for this or any previous visit.  Thoracic MR w contrast: No results found for this or any previous visit.  Thoracic CT wo contrast: Results for orders placed during the hospital encounter of 09/19/18  CT Thoracic Spine Wo Contrast  Narrative CLINICAL DATA:  76 year old female with back pain. History of multiple compression fractures and vertebral augmentations.  EXAM: CT THORACIC AND LUMBAR SPINE WITHOUT CONTRAST  TECHNIQUE: Multidetector CT imaging of the thoracic and lumbar spine was performed without contrast. Multiplanar CT image reconstructions were also generated.  COMPARISON:  Lumbar radiographs 1004 hours today. CT Abdomen  and Pelvis 03/05/2018.  FINDINGS: CT THORACIC SPINE FINDINGS  Thoracic segmentation: Hypoplastic ribs at T12 but otherwise normal.  Alignment: Mild to moderate dextroconvex thoracic scoliosis. Straightening of thoracic lordosis.  Vertebrae: Osteopenia.  There are mild superior endplate deformities of T4 and T7 which appear nonacute.  There is T10 vertebral body compression with 30% loss of vertebral body height. The inferior endplate deformity has an acute to subacute appearance. The T10 pedicles and posterior elements appear intact. There is minor retropulsion of the posterior inferior T10 endplate. No significant spinal stenosis, despite superimposed degenerative posterior element hypertrophy at that level.  The remaining thoracic levels including T11 and T12 appear intact.  Chronic left posterior 11th rib fracture at the costovertebral junction. No other posterior rib fracture identified.  Paraspinal and other soft tissues: Calcified aortic atherosclerosis. Calcified coronary artery atherosclerosis. Moderate size gastric hiatal hernia. Otherwise negative visible upper abdominal viscera.  Negative visible lung parenchyma aside from mild atelectasis.  Disc levels: No significant thoracic spinal stenosis.  CT LUMBAR SPINE FINDINGS  Segmentation: Normal and concordant with the thoracic spine numbering.  Alignment: Stable since April. Preserved lower lumbar lordosis. Exaggerated upper lumbar lordosis and focal kyphosis at the thoracolumbar junction related to the previously augmented upper lumbar compression fractures.  Vertebrae: Osteopenia.  Chronic severe L1 compression fracture with stable prior augmentation and bony retropulsion resulting in moderate to severe spinal stenosis (series 8, image 23 and series 12, image 38).  Moderate chronic L2 compression fracture and augmentation is stable with mild bony retropulsion.  L3 through L5, the visible sacrum and SI  joints remain intact.  Paraspinal and other soft tissues: Extensive Aortoiliac calcified atherosclerosis. Benign left renal midpole cysts suspected. Negative visualized posterior paraspinal soft tissues.  Disc levels:  Stable and negative lower lumbar disc spaces. Moderate to severe L1 level spinal stenosis related to chronic bony retropulsion appears stable since April.  IMPRESSION: CT THORACIC SPINE IMPRESSION  1. Acute to subacute T10 compression fracture with 30% loss of vertebral body height, minimal endplate retropulsion, and no complicating features. 2. Osteopenia. Chronic appearing mild T4 and T7 compression fractures. 3. Calcified coronary artery atherosclerosis. Chronic hiatal hernia.  CT LUMBAR SPINE IMPRESSION  1. No acute findings. 2. Osteopenia. Chronic severe L1 compression fracture with prior augmentation and stable bony retropulsion resulting in moderate to severe spinal stenosis. Chronic previously augmented moderate L2 compression fracture. 3.  Aortic Atherosclerosis (ICD10-I70.0).   Electronically Signed By: Genevie Ann M.D. On: 09/19/2018 11:19  Thoracic CT w/wo contrast: No results found for this or any previous visit.  Thoracic CT w/wo contrast: No results found for this or any previous visit.  Thoracic CT w contrast: No results found for this or any previous visit.  Thoracic DG 2-3 views: Results for orders placed during the hospital encounter of 09/26/18  DG Thoracic Spine 2 View  Narrative CLINICAL DATA:  Mid back pain. Recent T10 surgery. Fall out of bed.  EXAM: THORACIC SPINE 2 VIEWS  COMPARISON:  CT 09/19/2018.  FINDINGS: Changes of vertebral augmentation noted at T10, L1 and L2. Kyphotic deformity at L1, stable. No new fracture.  IMPRESSION: Vertebral augmentation changes as above.  No acute bony abnormality.   Electronically Signed By: Rolm Baptise M.D. On: 09/26/2018 08:51  Thoracic DG 4 views: No results found for this or  any previous visit.  Thoracic DG: No results found for this or any previous visit.  Thoracic DG w/swimmers view: No results found for this or any previous visit.  Thoracic DG Myelogram views: No results found for this or any previous visit.  Thoracic DG Myelogram views: No results found for this or any previous visit.   Lumbosacral Imaging: Lumbar MR wo contrast: Results for orders placed during the hospital encounter of 12/24/20  MR LUMBAR SPINE WO CONTRAST  Narrative CLINICAL DATA:  Sudden onset low back pain radiating to the waist.  EXAM: MRI LUMBAR SPINE WITHOUT CONTRAST  TECHNIQUE: Multiplanar, multisequence MR imaging of the lumbar spine was performed. No intravenous contrast was administered.  COMPARISON:  03/03/2020  FINDINGS: Segmentation:  5 lumbar type vertebrae  Alignment: Kyphotic deformity at the level of L1 due to remote fracture. Otherwise hyperlordosis.  Vertebrae: Marrow edema throughout the L5 body with mild height loss. Edematous signal around the bilateral L5-S1 facets which could be degenerative or secondary to the trauma. No fracture deformity is seen at the posterior elements.  L4 compression deformity with moderate height loss since 2021 comparison, but healed.  Remote T10, L1, L2, and L3 vertebral body fractures with cement augmentation. Height loss was particularly advanced at L1 with retropulsion and conus compression.  On STIR imaging there is minimally covered hyperintensity at the left sacral ala, not covered on axial slices.  Conus medullaris and cauda equina: Conus extends to the T12-L1 level. Conus and cauda equina appear normal. A small amount of hemorrhage may be present posterior to the L5 body. No complete thecal sac effacement  Paraspinal and other soft tissues: Atrophy of intrinsic back muscles. Left renal cyst.  Disc levels:  Generalized degenerative facet spurring. Altered disc spaces primarily from old trauma. No  degenerative impingement.  IMPRESSION: 1. Acute L5 compression fracture with mild height loss and no retropulsion. 2. Suspect acute left sacral ala insufficiency fracture but only partially covered. 3. L4 compression fracture with moderate height loss, new from April 2021 but healed. 4. Remote T10, L1, L2, and L3 vertebral body fractures. Chronic retropulsion at L1 causing marked spinal stenosis.   Electronically Signed By: Monte Fantasia M.D. On: 12/25/2020 05:49  Lumbar MR wo contrast: No valid procedures specified. Lumbar MR w/wo contrast: No results found for this or any previous visit.  Lumbar MR w/wo contrast: No results found for this or any previous visit.  Lumbar MR w contrast: No results found for this or any previous visit.  Lumbar CT wo contrast: Results for orders placed during the hospital encounter of 09/19/18  CT Lumbar Spine Wo Contrast  Narrative CLINICAL DATA:  76 year old  female with back pain. History of multiple compression fractures and vertebral augmentations.  EXAM: CT THORACIC AND LUMBAR SPINE WITHOUT CONTRAST  TECHNIQUE: Multidetector CT imaging of the thoracic and lumbar spine was performed without contrast. Multiplanar CT image reconstructions were also generated.  COMPARISON:  Lumbar radiographs 1004 hours today. CT Abdomen and Pelvis 03/05/2018.  FINDINGS: CT THORACIC SPINE FINDINGS  Thoracic segmentation: Hypoplastic ribs at T12 but otherwise normal.  Alignment: Mild to moderate dextroconvex thoracic scoliosis. Straightening of thoracic lordosis.  Vertebrae: Osteopenia.  There are mild superior endplate deformities of T4 and T7 which appear nonacute.  There is T10 vertebral body compression with 30% loss of vertebral body height. The inferior endplate deformity has an acute to subacute appearance. The T10 pedicles and posterior elements appear intact. There is minor retropulsion of the posterior inferior T10 endplate. No  significant spinal stenosis, despite superimposed degenerative posterior element hypertrophy at that level.  The remaining thoracic levels including T11 and T12 appear intact.  Chronic left posterior 11th rib fracture at the costovertebral junction. No other posterior rib fracture identified.  Paraspinal and other soft tissues: Calcified aortic atherosclerosis. Calcified coronary artery atherosclerosis. Moderate size gastric hiatal hernia. Otherwise negative visible upper abdominal viscera.  Negative visible lung parenchyma aside from mild atelectasis.  Disc levels: No significant thoracic spinal stenosis.  CT LUMBAR SPINE FINDINGS  Segmentation: Normal and concordant with the thoracic spine numbering.  Alignment: Stable since April. Preserved lower lumbar lordosis. Exaggerated upper lumbar lordosis and focal kyphosis at the thoracolumbar junction related to the previously augmented upper lumbar compression fractures.  Vertebrae: Osteopenia.  Chronic severe L1 compression fracture with stable prior augmentation and bony retropulsion resulting in moderate to severe spinal stenosis (series 8, image 23 and series 12, image 38).  Moderate chronic L2 compression fracture and augmentation is stable with mild bony retropulsion.  L3 through L5, the visible sacrum and SI joints remain intact.  Paraspinal and other soft tissues: Extensive Aortoiliac calcified atherosclerosis. Benign left renal midpole cysts suspected. Negative visualized posterior paraspinal soft tissues.  Disc levels:  Stable and negative lower lumbar disc spaces. Moderate to severe L1 level spinal stenosis related to chronic bony retropulsion appears stable since April.  IMPRESSION: CT THORACIC SPINE IMPRESSION  1. Acute to subacute T10 compression fracture with 30% loss of vertebral body height, minimal endplate retropulsion, and no complicating features. 2. Osteopenia. Chronic appearing mild T4 and T7  compression fractures. 3. Calcified coronary artery atherosclerosis. Chronic hiatal hernia.  CT LUMBAR SPINE IMPRESSION  1. No acute findings. 2. Osteopenia. Chronic severe L1 compression fracture with prior augmentation and stable bony retropulsion resulting in moderate to severe spinal stenosis. Chronic previously augmented moderate L2 compression fracture. 3.  Aortic Atherosclerosis (ICD10-I70.0).   Electronically Signed By: Genevie Ann M.D. On: 09/19/2018 11:19  Lumbar CT w/wo contrast: No results found for this or any previous visit.  Lumbar CT w/wo contrast: No results found for this or any previous visit.  Lumbar CT w contrast: No results found for this or any previous visit.  Lumbar DG 1V: No results found for this or any previous visit.  Lumbar DG 1V (Clearing): No results found for this or any previous visit.  Lumbar DG 2-3V (Clearing): No results found for this or any previous visit.  Lumbar DG 2-3 views: Results for orders placed during the hospital encounter of 12/30/20  DG Lumbar Spine 2-3 Views  Narrative CLINICAL DATA:  Surgery, elective. Additional history provided: L5 kyphoplasty. Provided fluoroscopy time 1 minutes,  23 seconds.  EXAM: LUMBAR SPINE - 2-3 VIEW; DG C-ARM 1-60 MIN  COMPARISON:  MRI of the lumbar spine 12/24/2020.  FINDINGS: A lateral view intraprocedural fluoroscopic image of the lumbar spine, and a AP projection cine fluoroscopy clip of the lumbar spine, are submitted. The images demonstrate new kyphoplasty material within the L5 vertebral body at site of a known L5 compression fracture. Redemonstrated sequela of prior multilevel vertebral augmentation more cephalad within the lumbar spine.  IMPRESSION: Intraoperative fluoroscopic images of the lumbar spine from L5 kyphoplasty, as described.   Electronically Signed By: Kellie Simmering DO On: 12/30/2020 14:43  Lumbar DG (Complete) 4+V: No results found for this or any previous  visit.        Lumbar DG F/E views: No results found for this or any previous visit.        Lumbar DG Bending views: No results found for this or any previous visit.        Lumbar DG Myelogram views: No results found for this or any previous visit.  Lumbar DG Myelogram: No results found for this or any previous visit.  Lumbar DG Myelogram: No results found for this or any previous visit.  Lumbar DG Myelogram: No results found for this or any previous visit.  Lumbar DG Myelogram Lumbosacral: No results found for this or any previous visit.  Lumbar DG Diskogram views: No results found for this or any previous visit.  Lumbar DG Diskogram views: No results found for this or any previous visit.  Lumbar DG Epidurogram OP: No results found for this or any previous visit.  Lumbar DG Epidurogram IP: No valid procedures specified.  Sacroiliac Joint Imaging: Sacroiliac Joint DG: No results found for this or any previous visit.  Sacroiliac Joint MR w/wo contrast: No results found for this or any previous visit.  Sacroiliac Joint MR wo contrast: No results found for this or any previous visit.   Spine Imaging: Whole Spine DG Myelogram views: No results found for this or any previous visit.  Whole Spine MR Mets screen: No results found for this or any previous visit.  Whole Spine MR Mets screen: No results found for this or any previous visit.  Whole Spine MR w/wo: No results found for this or any previous visit.  MRA Spinal Canal w/ cm: No results found for this or any previous visit.  MRA Spinal Canal wo/ cm: No valid procedures specified. MRA Spinal Canal w/wo cm: No results found for this or any previous visit.  Spine Outside MR Films: No results found for this or any previous visit.  Spine Outside CT Films: No results found for this or any previous visit.  CT-Guided Biopsy: No results found for this or any previous visit.  CT-Guided Needle Placement: No results found for  this or any previous visit.  DG Spine outside: No results found for this or any previous visit.  IR Spine outside: No results found for this or any previous visit.  NM Spine outside: No results found for this or any previous visit.   Hip Imaging: Hip-R MR w contrast: No results found for this or any previous visit.  Hip-L MR w contrast: No results found for this or any previous visit.  Hip-R MR w/wo contrast: No results found for this or any previous visit.  Hip-L MR w/wo contrast: No results found for this or any previous visit.  Hip-R MR wo contrast: No results found for this or any previous visit.  Hip-L MR  wo contrast: No results found for this or any previous visit.  Hip-R CT w contrast: No results found for this or any previous visit.  Hip-L CT w contrast: No results found for this or any previous visit.  Hip-R CT w/wo contrast: No results found for this or any previous visit.  Hip-L CT w/wo contrast: No results found for this or any previous visit.  Hip-R CT wo contrast: No results found for this or any previous visit.  Hip-L CT wo contrast: No results found for this or any previous visit.  Hip-R DG 2-3 views: Results for orders placed during the hospital encounter of 06/15/22  DG Hip Unilat W OR W/O Pelvis 2-3 Views Right  Narrative CLINICAL DATA:  Trauma, fall, pain  EXAM: DG HIP (WITH OR WITHOUT PELVIS) 2-3V RIGHT  COMPARISON:  None Available.  FINDINGS: No recent fracture or dislocation is seen in right hip. Osteopenia is seen in the bony structures. Degenerative changes with small bony spurs are noted. Deformity seen in left pubic bone may be residual from previous injury. There is previous kyphoplasty in few lumbar vertebral bodies. There is suggestion of ventral hernia repair.  IMPRESSION: No recent fracture or dislocation is seen in right hip.  Deformities seen in left pubic bone may be residual from previous injury.   Electronically  Signed By: Elmer Picker M.D. On: 06/16/2022 12:04  Hip-L DG 2-3 views: Results for orders placed during the hospital encounter of 06/22/21  DG Hip Unilat W or Wo Pelvis 2-3 Views Left  Narrative CLINICAL DATA:  Fall and left lower extremity pain.  EXAM: DG HIP (WITH OR WITHOUT PELVIS) 2-3V LEFT; LEFT KNEE - COMPLETE 4+ VIEW; LEFT TIBIA AND FIBULA - 2 VIEW  COMPARISON:  None.  FINDINGS: There is a comminuted, displaced, and mildly impacted fracture of the distal femur. There is dorsal angulation of the distal fracture fragment. There are displaced fractures of the left superior and inferior pubic rami, age indeterminate, likely acute. There is no dislocation. The bones are osteopenic. Lower lumbar vertebroplasty. Ventral hernia repair mesh.  IMPRESSION: 1. Comminuted, displaced and angulated fracture of the distal femur. 2. Displaced fractures of the left superior and inferior pubic rami, likely acute.   Electronically Signed By: Anner Crete M.D. On: 06/23/2021 00:19  Hip-R DG Arthrogram: No results found for this or any previous visit.  Hip-L DG Arthrogram: No results found for this or any previous visit.  Hip-B DG Bilateral: No results found for this or any previous visit.   Knee Imaging: Knee-R MR w contrast: No results found for this or any previous visit.  Knee-L MR w/o contrast: No results found for this or any previous visit.  Knee-R MR w/wo contrast: No results found for this or any previous visit.  Knee-L MR w/wo contrast: No results found for this or any previous visit.  Knee-R MR wo contrast: No results found for this or any previous visit.  Knee-L MR wo contrast: No results found for this or any previous visit.  Knee-R CT w contrast: No results found for this or any previous visit.  Knee-L CT w contrast: No results found for this or any previous visit.  Knee-R CT w/wo contrast: No results found for this or any previous visit.  Knee-L  CT w/wo contrast: No results found for this or any previous visit.  Knee-R CT wo contrast: No results found for this or any previous visit.  Knee-L CT wo contrast: No results found for this  or any previous visit.  Knee-R DG 1-2 views: No results found for this or any previous visit.  Knee-L DG 1-2 views: No results found for this or any previous visit.  Knee-R DG 3 views: No results found for this or any previous visit.  Knee-L DG 3 views: No results found for this or any previous visit.  Knee-R DG 4 views: No results found for this or any previous visit.  Knee-L DG 4 views: Results for orders placed during the hospital encounter of 06/22/21  DG Knee Complete 4 Views Left  Narrative CLINICAL DATA:  Fall and left lower extremity pain.  EXAM: DG HIP (WITH OR WITHOUT PELVIS) 2-3V LEFT; LEFT KNEE - COMPLETE 4+ VIEW; LEFT TIBIA AND FIBULA - 2 VIEW  COMPARISON:  None.  FINDINGS: There is a comminuted, displaced, and mildly impacted fracture of the distal femur. There is dorsal angulation of the distal fracture fragment. There are displaced fractures of the left superior and inferior pubic rami, age indeterminate, likely acute. There is no dislocation. The bones are osteopenic. Lower lumbar vertebroplasty. Ventral hernia repair mesh.  IMPRESSION: 1. Comminuted, displaced and angulated fracture of the distal femur. 2. Displaced fractures of the left superior and inferior pubic rami, likely acute.   Electronically Signed By: Anner Crete M.D. On: 06/23/2021 00:19  Knee-R DG Arthrogram: No results found for this or any previous visit.  Knee-L DG Arthrogram: No results found for this or any previous visit.   Ankle Imaging: Ankle-R DG Complete: No results found for this or any previous visit.  Ankle-L DG Complete: No results found for this or any previous visit.   Foot Imaging: Foot-R DG Complete: No results found for this or any previous visit.  Foot-L DG  Complete: No results found for this or any previous visit.   Elbow Imaging: Elbow-R DG Complete: No results found for this or any previous visit.  Elbow-L DG Complete: Results for orders placed during the hospital encounter of 08/07/19  DG Elbow Complete Left  Narrative CLINICAL DATA:  ORIF L distal humerus Dr. Leanne Chang time 34 seconds RSTO performed by Glade Stanford, RT(R)  EXAM: LEFT ELBOW - COMPLETE 3+ VIEW; DG C-ARM 1-60 MIN  COMPARISON:  07/25/2019  FINDINGS: Eleven images are submitted. These demonstrate various stages of open reduction internal fixation of the proximal ulna and distal humerus. Four views are also performed of the LEFT wrist, showing deformity of the distal humerus and significant ulnar plus variance, possibly a remote fracture. No prior exams are available for comparison of the wrist.  IMPRESSION: 1. Status post ORIF of the proximal ulna and distal humerus. 2. Significant ulnar plus variance related to distal radius fracture, likely chronic.   Electronically Signed By: Nolon Nations M.D. On: 08/07/2019 16:03   Wrist Imaging: Wrist-R DG Complete: No results found for this or any previous visit.  Wrist-L DG Complete: Results for orders placed during the hospital encounter of 05/29/20  DG Wrist Complete Left  Narrative CLINICAL DATA:  [place] this morning, wrist pain  EXAM: LEFT WRIST - COMPLETE 3+ VIEW  COMPARISON:  None.  FINDINGS: Frontal, oblique, lateral, and ulnar deviated views of the left wrist are obtained. There is evidence of a prior healed distal left radial fracture with resulting volar angulation. 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 No acute bony abnormalities. Radiocarpal joint remains intact. Prominent osteoarthritis of the carpometacarpal joints. Bones are osteopenic. Soft tissues are unremarkable.  IMPRESSION: 1. No acute displaced fracture. 2.  Sequela of prior healed fracture of the distal  left radius. 3. Osteoarthritis carpometacarpal joints.   Electronically Signed By: Randa Ngo M.D. On: 05/29/2020 19:49   Hand Imaging: Hand-R DG Complete: No results found for this or any previous visit.  Hand-L DG Complete: No results found for this or any previous visit.   Complexity Note: Imaging results reviewed.                         ROS  Cardiovascular: {Hx; Cardiovascular History:210120525} Pulmonary or Respiratory: {Hx; Pumonary and/or Respiratory History:210120523} Neurological: {Hx; Neurological:210120504} Psychological-Psychiatric: {Hx; Psychological-Psychiatric History:210120512} Gastrointestinal: {Hx; Gastrointestinal:210120527} Genitourinary: {Hx; Genitourinary:210120506} Hematological: {Hx; Hematological:210120510} Endocrine: {Hx; Endocrine history:210120509} Rheumatologic: {Hx; Rheumatological:210120530} Musculoskeletal: {Hx; Musculoskeletal:210120528} Work History: {Hx; Work history:210120514}  Allergies  Ms. Hossain is allergic to tape.  Laboratory Chemistry Profile   Renal Lab Results  Component Value Date   BUN 15 12/24/2021   CREATININE 0.72 12/24/2021   BCR 17 06/28/2020   GFRAA 105 06/28/2020   GFRNONAA >60 12/24/2021   SPECGRAV 1.015 02/09/2021   PHUR 7.0 02/09/2021   PROTEINUR NEGATIVE 12/05/2021     Electrolytes Lab Results  Component Value Date   NA 137 12/24/2021   K 3.9 12/24/2021   CL 107 12/24/2021   CALCIUM 7.8 (L) 12/24/2021   MG 2.1 08/07/2019   PHOS 3.1 08/07/2019     Hepatic Lab Results  Component Value Date   AST 27 12/05/2021   ALT 17 12/05/2021   ALBUMIN 3.9 12/05/2021   ALKPHOS 110 12/05/2021   LIPASE 24 12/26/2018     ID Lab Results  Component Value Date   SARSCOV2NAA NEGATIVE 12/27/2021   STAPHAUREUS NEGATIVE 12/05/2021   MRSAPCR NEGATIVE 12/05/2021   HCVAB NEGATIVE 05/21/2017     Bone Lab Results  Component Value Date   VD25OH 57.46 08/07/2019   VD125OH2TOT 71.4 08/07/2019      Endocrine Lab Results  Component Value Date   GLUCOSE 109 (H) 12/24/2021   GLUCOSEU NEGATIVE 12/05/2021   HGBA1C 5.1 06/28/2020   TSH 2.66 06/28/2020   FREET4 0.7 (L) 05/21/2017     Neuropathy Lab Results  Component Value Date   VITAMINB12 3,006 (H) 03/08/2021   FOLATE >100.0 03/08/2021   HGBA1C 5.1 06/28/2020     CNS No results found for: "COLORCSF", "APPEARCSF", "RBCCOUNTCSF", "WBCCSF", "POLYSCSF", "LYMPHSCSF", "EOSCSF", "PROTEINCSF", "GLUCCSF", "JCVIRUS", "CSFOLI", "IGGCSF", "LABACHR", "ACETBL"   Inflammation (CRP: Acute  ESR: Chronic) Lab Results  Component Value Date   LATICACIDVEN 2.0 ([place]) 03/01/2020     Rheumatology No results found for: "RF", "ANA", "LABURIC", "URICUR", "LYMEIGGIGMAB", "LYMEABIGMQN", "HLAB27"   Coagulation Lab Results  Component Value Date   INR 1.0 08/07/2019   LABPROT 13.2 08/07/2019   APTT 30 08/07/2019   PLT 217 12/24/2021     Cardiovascular Lab Results  Component Value Date   BNP 25.0 12/26/2018   CKTOTAL 389 (H) 06/22/2021   TROPONINI 0.57 (HH) 09/27/2018   HGB 8.9 (L) 12/24/2021   HCT 27.6 (L) 12/24/2021     Screening Lab Results  Component Value Date   SARSCOV2NAA NEGATIVE 12/27/2021   STAPHAUREUS NEGATIVE 12/05/2021   MRSAPCR NEGATIVE 12/05/2021   HCVAB NEGATIVE 05/21/2017     Cancer No results found for: "CEA", "CA125", "LABCA2"   Allergens No results found for: "ALMOND", "APPLE", "ASPARAGUS", "AVOCADO", "BANANA", "BARLEY", "BASIL", "BAYLEAF", "GREENBEAN", "LIMABEAN", "WHITEBEAN", "BEEFIGE", "REDBEET", "BLUEBERRY", "BROCCOLI", "CABBAGE", "MELON", "CARROT", "CASEIN", "CASHEWNUT", "CAULIFLOWER", "CELERY"     Note: Lab results reviewed.  [place]  Drug: Ms. Vath  reports no history of drug use. Alcohol:  reports no history of alcohol use. Tobacco:  reports that she has never smoked. She has never used smokeless tobacco. Medical:  has a past medical history of Abdominal hernia with obstruction and without gangrene,  Anxiety (02/15/2017), Asthma, Bipolar 1 disorder, depressed, full remission ([place]) (02/15/2017), Bipolar affective disorder ([place]), COPD (chronic obstructive pulmonary disease) ([place]), Depression, GERD (gastroesophageal reflux disease) (02/15/2017), Glaucoma, Headache, Hiatal hernia, History of abdominal hernia (05/21/2017), History of compression fracture of spine (02/15/2017), Hyperlipidemia, Hypertension, Incisional hernia, Incisional ventral hernia w obstruction (06/02/2017), Normocytic anemia (05/23/2017), Osteoarthritis of knees, 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 Family: family history includes Breast cancer (age of onset: 52) in her mother; Cancer in her mother; Hypertension in her father.  Past Surgical History:  Procedure Laterality Date   ABDOMINAL HYSTERECTOMY  11/06/2006   APPENDECTOMY     BREAST BIOPSY Right 12/14/2020   stereo bx of distortion/asymmetry, coil marker, path pending   BREAST EXCISIONAL BIOPSY Left    age 67- benign    CESAREAN SECTION     x3   COLONOSCOPY WITH PROPOFOL N/A 06/25/2018   Procedure: COLONOSCOPY WITH PROPOFOL;  Surgeon: Jonathon Bellows, MD;  Location: [facility] ENDOSCOPY;  Service: Endoscopy;  Laterality: N/A;   KYPHOPLASTY     KYPHOPLASTY N/A 09/23/2018   Procedure: KYPHOPLASTY T10;  Surgeon: Hessie Knows, MD;  Location: [facility] ORS;  Service: Orthopedics;  Laterality: N/A;   KYPHOPLASTY N/A 03/11/2020   Procedure: L3 KYPHOPLASTY;  Surgeon: Hessie Knows, MD;  Location: [facility] ORS;  Service: Orthopedics;  Laterality: N/A;   KYPHOPLASTY N/A 12/30/2020   Procedure: L5 KYPHOPLASTY;  Surgeon: Hessie Knows, MD;  Location: [facility] ORS;  Service: Orthopedics;  Laterality: N/A;   ORIF FEMUR FRACTURE Left 06/23/2021   Procedure: OPEN REDUCTION INTERNAL FIXATION (ORIF) DISTAL FEMUR FRACTURE;  Surgeon: Hessie Knows, MD;  Location: [facility] ORS;  Service: Orthopedics;  Laterality: Left;   ORIF HUMERUS FRACTURE Left 08/07/2019   Procedure: OPEN REDUCTION INTERNAL  FIXATION (ORIF) LEFT DISTAL HUMERUS FRACTURE;  Surgeon: [place], MD;  Location: [place];  Service: Orthopedics;  Laterality: Left;   ORIF HUMERUS FRACTURE Left 12/23/2021   Procedure: OPEN REDUCTION INTERNAL FIXATION (ORIF) HUMERAL SHAFT FRACTURE;  Surgeon: Leim Fabry, MD;  Location: [facility] ORS;  Service: Orthopedics;  Laterality: Left;   REVERSE SHOULDER ARTHROPLASTY Left 12/23/2021   Procedure: ATTEMPTED Left reverse shoulder arthroplasty;  Surgeon: Leim Fabry, MD;  Location: [facility] ORS;  Service: Orthopedics;  Laterality: Left;   VENTRAL HERNIA REPAIR N/A 06/02/2017   Procedure: exploratory laparotomy repair of incarcerated  VENTRAL hernia;  Surgeon: Florene Glen, MD;  Location: [facility] ORS;  Service: General;  Laterality: N/A;   VENTRAL HERNIA REPAIR N/A 08/29/2018   Procedure: LAPAROSCOPIC INCISIONAL HERNIA;  Surgeon: Olean Ree, MD;  Location: [facility] ORS;  Service: General;  Laterality: N/A;   Active Ambulatory Problems    Diagnosis Date Noted   Bipolar 1 disorder, depressed, full remission ([place]) 02/15/2017   GERD (gastroesophageal reflux disease) 02/15/2017   Osteoarthritis of knees, 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   Hyperlipidemia 02/15/2017   Anxiety 02/15/2017   Normocytic anemia 05/23/2017   Allergic rhinitis due to allergen 05/14/2018   COPD (chronic obstructive pulmonary disease) ([place]) 12/26/2018   Malnutrition of moderate degree 12/27/2018   Essential hypertension 06/28/2020   Recurrent falls 01/05/2022   Venous insufficiency of both lower extremities 12/13/2021   Acute blood loss anemia 01/05/2022   Bipolar and related disorder ([place]) 01/05/2022  Blindness, 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   Closed displaced comminuted fracture of shaft of left humerus 01/04/2022   Erythrocytosis 01/05/2022   History of CVA (cerebrovascular accident) 01/05/2022   History of non-ST elevation myocardial infarction (NSTEMI) 01/05/2022   Left acetabular fracture ([place]) 05/04/2021   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   Osteoporosis 01/05/2022   S/P reverse total shoulder arthroplasty, 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   Fall 05/04/2021   Closed 4-part fracture of proximal end of left humerus with malunion 01/04/2022   Chronic pain syndrome 07/26/2022   Pharmacologic therapy 07/26/2022   Disorder of skeletal system 07/26/2022   Problems influencing health status 07/26/2022   Resolved Ambulatory Problems    Diagnosis Date Noted   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   Presbycusis of both ears 02/15/2017   History of compression fracture of spine 02/15/2017   History of abdominal hernia 05/21/2017   Abdominal hernia with obstruction and without gangrene    Incisional ventral hernia w obstruction 06/02/2017   Incisional hernia, without obstruction or gangrene 08/28/2018   Elevated troponin 09/26/2018   Lumbar hernia 11/19/2018   Osteopenia of right foot 02/11/2019   Rib pain on right side 08/06/2020   Macrocytosis 03/08/2021   Abnormal SPEP 03/08/2021   Accidental fall    Preoperative clearance    Closed comminuted intra-articular fracture of distal femur, left, initial encounter ([place]) 06/23/2021   Closed fracture of multiple pubic rami, left, initial encounter ([place]) 06/23/2021   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 12/23/2021   Past Medical History:  Diagnosis Date   Asthma    Bipolar affective disorder ([place])    Depression    Glaucoma    Headache    Hiatal hernia    Hypertension    Incisional hernia    Constitutional Exam  General appearance: Well nourished, well developed, and well hydrated. In no apparent acute distress There were no vitals filed for this visit. BMI Assessment: Estimated body mass index is 18.81 kg/m as calculated from the following:   Height as of 05/25/22: '4\' 10"'  (1.473 m).   Weight as of 06/15/22: 90 lb (40.8 kg).  BMI interpretation table: BMI level Category Range association with higher incidence of chronic pain  <18  kg/m2 Underweight   18.5-24.9 kg/m2 Ideal body weight   25-29.9 kg/m2 Overweight Increased incidence by 20%  30-34.9 kg/m2 Obese (Class I) Increased incidence by 68%  35-39.9 kg/m2 Severe obesity (Class II) Increased incidence by 136%  >40 kg/m2 Extreme obesity (Class III) Increased incidence by 254%   Patient's current BMI Ideal Body weight  There is no height or weight on file to calculate BMI. Patient weight not recorded   BMI Readings from Last 4 Encounters:  06/15/22 18.81 kg/m  05/25/22 19.06 kg/m  01/16/22 20.27 kg/m  12/23/21 20.27 kg/m   Wt Readings from Last 4 Encounters:  06/15/22 90 lb (40.8 kg)  05/25/22 91 lb 3.2 oz (41.4 kg)  01/16/22 97 lb (44 kg)  12/23/21 97 lb (44 kg)    Psych/Mental status: Alert, oriented x 3 (person, place, & time)       Eyes: PERLA Respiratory: No evidence of acute respiratory distress  Assessment  Primary Diagnosis & Pertinent Problem List: The primary encounter diagnosis was Chronic pain syndrome. Diagnoses of Pharmacologic therapy, Disorder of skeletal system, and Problems influencing health status were also pertinent to this visit.  Visit Diagnosis (New problems to examiner): 1. Chronic pain syndrome   2. Pharmacologic therapy   3. Disorder of  skeletal system   4. Problems influencing health status    Plan of Care (Initial workup plan)  Note: Ms. Chiquito was reminded that as per protocol, today's visit has been an evaluation only. We have not taken over the patient's controlled substance management.  Problem-specific plan: No problem-specific Assessment & Plan notes found for this encounter.  Lab Orders  No laboratory test(s) ordered today   Imaging Orders  No imaging studies ordered today   Referral Orders  No referral(s) requested today   Procedure Orders    No procedure(s) ordered today   Pharmacotherapy (current): Medications ordered:  No orders of the defined types were placed in this  encounter.  Medications administered during this visit: Gaylyn Berish. Wyly had no medications administered during this visit.   Pharmacological management options:  Opioid Analgesics: The patient was informed that there is no guarantee that she would be a candidate for opioid analgesics. The decision will be made following CDC guidelines. This decision will be based on the results of diagnostic studies, as well as Ms. Buchbinder's risk profile.   Membrane stabilizer: To be determined at a later time  Muscle relaxant: To be determined at a later time  NSAID: To be determined at a later time  Other analgesic(s): To be determined at a later time   Interventional management options: Ms. Leopard was informed that there is no guarantee that she would be a candidate for interventional therapies. The decision will be based on the results of diagnostic studies, as well as Ms. Carrillo's risk profile.  Procedure(s) under consideration:  Pending results of ordered studies      Interventional Therapies  Risk  Complexity Considerations:   Estimated body mass index is 18.81 kg/m as calculated from the following:   Height as of 05/25/22: '4\' 10"'  (1.473 m).   Weight as of 06/15/22: 90 lb (40.8 kg). WNL   Planned  Pending:   Pending further evaluation   Under consideration:   ***   Completed:   None at this time   Completed by other providers:   None at this time   Therapeutic  Palliative (PRN) options:   None established      Provider-requested follow-up: No follow-ups on file.  Future Appointments  Date Time Provider [place]  07/27/2022 11:00 AM Milinda Pointer, MD [facility] None  08/01/2022  9:20 AM Parks Ranger Devonne Doughty, DO [facility]    Note by: Gaspar Cola, MD Date: 07/27/2022; Time: 4:24 PM

## 2022-07-27 ENCOUNTER — Ambulatory Visit (HOSPITAL_BASED_OUTPATIENT_CLINIC_OR_DEPARTMENT_OTHER): Payer: Medicare Other | Admitting: Pain Medicine

## 2022-07-27 ENCOUNTER — Encounter: Payer: Self-pay | Admitting: Pain Medicine

## 2022-07-27 ENCOUNTER — Other Ambulatory Visit
Admission: RE | Admit: 2022-07-27 | Discharge: 2022-07-27 | Disposition: A | Discharge status: Home / Self Care | Payer: Medicare Other | Attending: Pain Medicine | Admitting: Pain Medicine

## 2022-07-27 VITALS — BP 138/61 | HR 90 | Temp 98.2°F | Resp 16 | Ht <= 58 in | Wt 91.0 lb

## 2022-07-27 DIAGNOSIS — F319 Bipolar disorder, unspecified: Secondary | ICD-10-CM | POA: Insufficient documentation

## 2022-07-27 DIAGNOSIS — J309 Allergic rhinitis, unspecified: Secondary | ICD-10-CM | POA: Insufficient documentation

## 2022-07-27 DIAGNOSIS — D751 Secondary polycythemia: Secondary | ICD-10-CM | POA: Insufficient documentation

## 2022-07-27 DIAGNOSIS — Z789 Other specified health status: Secondary | ICD-10-CM | POA: Insufficient documentation

## 2022-07-27 DIAGNOSIS — Z8781 Personal history of (healed) traumatic fracture: Secondary | ICD-10-CM | POA: Insufficient documentation

## 2022-07-27 DIAGNOSIS — Z7982 Long term (current) use of aspirin: Secondary | ICD-10-CM | POA: Insufficient documentation

## 2022-07-27 DIAGNOSIS — F419 Anxiety disorder, unspecified: Secondary | ICD-10-CM | POA: Insufficient documentation

## 2022-07-27 DIAGNOSIS — Z79899 Other long term (current) drug therapy: Secondary | ICD-10-CM | POA: Insufficient documentation

## 2022-07-27 DIAGNOSIS — M899 Disorder of bone, unspecified: Secondary | ICD-10-CM | POA: Insufficient documentation

## 2022-07-27 DIAGNOSIS — Z8673 Personal history of transient ischemic attack (TIA), and cerebral infarction without residual deficits: Secondary | ICD-10-CM | POA: Insufficient documentation

## 2022-07-27 DIAGNOSIS — G894 Chronic pain syndrome: Secondary | ICD-10-CM

## 2022-07-27 DIAGNOSIS — R296 Repeated falls: Secondary | ICD-10-CM | POA: Insufficient documentation

## 2022-07-27 DIAGNOSIS — G8929 Other chronic pain: Secondary | ICD-10-CM | POA: Insufficient documentation

## 2022-07-27 DIAGNOSIS — I252 Old myocardial infarction: Secondary | ICD-10-CM | POA: Insufficient documentation

## 2022-07-27 DIAGNOSIS — M79605 Pain in left leg: Secondary | ICD-10-CM | POA: Insufficient documentation

## 2022-07-27 DIAGNOSIS — D62 Acute posthemorrhagic anemia: Secondary | ICD-10-CM | POA: Insufficient documentation

## 2022-07-27 DIAGNOSIS — E46 Unspecified protein-calorie malnutrition: Secondary | ICD-10-CM | POA: Diagnosis not present

## 2022-07-27 DIAGNOSIS — M25512 Pain in left shoulder: Secondary | ICD-10-CM | POA: Insufficient documentation

## 2022-07-27 DIAGNOSIS — M810 Age-related osteoporosis without current pathological fracture: Secondary | ICD-10-CM | POA: Insufficient documentation

## 2022-07-27 DIAGNOSIS — I10 Essential (primary) hypertension: Secondary | ICD-10-CM | POA: Insufficient documentation

## 2022-07-27 DIAGNOSIS — I872 Venous insufficiency (chronic) (peripheral): Secondary | ICD-10-CM | POA: Diagnosis not present

## 2022-07-27 DIAGNOSIS — E785 Hyperlipidemia, unspecified: Secondary | ICD-10-CM | POA: Diagnosis not present

## 2022-07-27 DIAGNOSIS — Z96612 Presence of left artificial shoulder joint: Secondary | ICD-10-CM | POA: Insufficient documentation

## 2022-07-27 DIAGNOSIS — D472 Monoclonal gammopathy: Secondary | ICD-10-CM | POA: Diagnosis not present

## 2022-07-27 DIAGNOSIS — H548 Legal blindness, as defined in USA: Secondary | ICD-10-CM | POA: Insufficient documentation

## 2022-07-27 DIAGNOSIS — D649 Anemia, unspecified: Secondary | ICD-10-CM | POA: Insufficient documentation

## 2022-07-27 DIAGNOSIS — M25562 Pain in left knee: Secondary | ICD-10-CM

## 2022-07-27 DIAGNOSIS — M170 Bilateral primary osteoarthritis of knee: Secondary | ICD-10-CM | POA: Diagnosis not present

## 2022-07-27 DIAGNOSIS — K219 Gastro-esophageal reflux disease without esophagitis: Secondary | ICD-10-CM | POA: Diagnosis not present

## 2022-07-27 DIAGNOSIS — J449 Chronic obstructive pulmonary disease, unspecified: Secondary | ICD-10-CM | POA: Insufficient documentation

## 2022-07-27 DIAGNOSIS — Z681 Body mass index (BMI) 19 or less, adult: Secondary | ICD-10-CM | POA: Insufficient documentation

## 2022-07-27 LAB — COMPREHENSIVE METABOLIC PANEL
ALT: 15 U/L (ref 0–44)
AST: 23 U/L (ref 15–41)
Albumin: 4.1 g/dL (ref 3.5–5.0)
Alkaline Phosphatase: 100 U/L (ref 38–126)
Anion gap: 14 (ref 5–15)
BUN: 19 mg/dL (ref 8–23)
CO2: 24 mmol/L (ref 22–32)
Calcium: 9.5 mg/dL (ref 8.9–10.3)
Chloride: 101 mmol/L (ref 98–111)
Creatinine, Ser: 0.44 mg/dL (ref 0.44–1.00)
GFR, Estimated: >60 mL/min (ref >60)
Glucose, Bld: 80 mg/dL (ref 70–99)
Potassium: 4 mmol/L (ref 3.5–5.1)
Sodium: 139 mmol/L (ref 135–145)
Total Bilirubin: 0.5 mg/dL (ref 0.3–1.2)
Total Protein: 7.3 g/dL (ref 6.5–8.1)

## 2022-07-27 LAB — VITAMIN B12: Vitamin B-12: 1489 pg/mL — ABNORMAL HIGH (ref 180–914)

## 2022-07-27 LAB — C-REACTIVE PROTEIN: CRP: 0.7 mg/dL (ref <1.0)

## 2022-07-27 LAB — MAGNESIUM: Magnesium: 2.6 mg/dL — ABNORMAL HIGH (ref 1.7–2.4)

## 2022-07-27 LAB — SEDIMENTATION RATE: Sed Rate: 2 mm/hr (ref 0–30)

## 2022-07-27 NOTE — Progress Notes (Signed)
Safety precautions to be maintained throughout the outpatient stay will include: orient to surroundings, keep bed in low position, maintain call bell within reach at all times, provide assistance with transfer out of bed and ambulation.  

## 2022-07-30 ENCOUNTER — Other Ambulatory Visit: Payer: Self-pay | Admitting: Family Medicine

## 2022-07-30 DIAGNOSIS — I10 Essential (primary) hypertension: Secondary | ICD-10-CM

## 2022-07-30 DIAGNOSIS — J441 Chronic obstructive pulmonary disease with (acute) exacerbation: Secondary | ICD-10-CM

## 2022-07-30 DIAGNOSIS — J432 Centrilobular emphysema: Secondary | ICD-10-CM

## 2022-07-31 NOTE — Telephone Encounter (Signed)
Requested Prescriptions  Pending Prescriptions Disp Refills  . albuterol (VENTOLIN HFA) 108 (90 Base) MCG/ACT inhaler [Pharmacy Med Name: ALBUTEROL HFA (PROAIR) INHALER] 8.5 each 1    Sig: INHALE 2 PUFFS BY MOUTH EVERY 4 HOURS AS NEEDED FOR WHEEZING OR SHORTNESS OF BREATH (COUGH)     Pulmonology:  Beta Agonists 2 Passed - 07/30/2022  8:58 AM      Passed - Last BP in normal range    BP Readings from Last 1 Encounters:  07/27/22 138/61         Passed - Last Heart Rate in normal range    Pulse Readings from Last 1 Encounters:  07/27/22 90         Passed - Valid encounter within last 12 months    Recent Outpatient Visits          1 month ago Hip pain, acute, right   [facility] [place], Coralie Keens, NP   2 months ago Primary osteoarthritis of both knees   [place], DO   6 months ago Left arm pain   [facility] [place], Devonne Doughty, DO   7 months ago Venous insufficiency of both lower extremities   [facility] [place], Devonne Doughty, DO   9 months ago Chronic left shoulder pain   [facility] [place], Coralie Keens, NP      Future Appointments            Tomorrow Parks Ranger, Devonne Doughty, DO [facility]           . amLODipine ([place]) 10 MG tablet [Pharmacy Med Name: AMLODIPINE BESYLATE 10 MG TAB] 90 tablet 3    Sig: TAKE 1 TABLET BY MOUTH EVERY DAY     Cardiovascular: Calcium Channel Blockers 2 Passed - 07/30/2022  8:58 AM      Passed - Last BP in normal range    BP Readings from Last 1 Encounters:  07/27/22 138/61         Passed - Last Heart Rate in normal range    Pulse Readings from Last 1 Encounters:  07/27/22 90         Passed - Valid encounter within last 6 months    Recent Outpatient Visits          1 month ago Hip pain, acute, right   [facility] [place], Coralie Keens, NP   2 months ago Primary osteoarthritis of both knees    [place], DO   6 months ago Left arm pain   [place], DO   7 months ago Venous insufficiency of both lower extremities   [facility] Olin Hauser, DO   9 months ago Chronic left shoulder pain   [facility] [place], Coralie Keens, NP      Future Appointments            Tomorrow Parks Ranger, Devonne Doughty, [facility], [facility]

## 2022-08-01 ENCOUNTER — Encounter: Payer: Self-pay | Admitting: Family Medicine

## 2022-08-01 ENCOUNTER — Ambulatory Visit (INDEPENDENT_AMBULATORY_CARE_PROVIDER_SITE_OTHER): Payer: Medicare Other | Admitting: Family Medicine

## 2022-08-01 VITALS — BP 121/70 | HR 89 | Ht <= 58 in | Wt 96.6 lb

## 2022-08-01 DIAGNOSIS — Z23 Encounter for immunization: Secondary | ICD-10-CM

## 2022-08-01 DIAGNOSIS — G894 Chronic pain syndrome: Secondary | ICD-10-CM | POA: Diagnosis not present

## 2022-08-01 DIAGNOSIS — M810 Age-related osteoporosis without current pathological fracture: Secondary | ICD-10-CM | POA: Diagnosis not present

## 2022-08-01 DIAGNOSIS — M9908 Segmental and somatic dysfunction of rib cage: Secondary | ICD-10-CM

## 2022-08-01 DIAGNOSIS — M170 Bilateral primary osteoarthritis of knee: Secondary | ICD-10-CM | POA: Diagnosis not present

## 2022-08-01 LAB — COMPLIANCE DRUG ANALYSIS, UR

## 2022-08-01 NOTE — Patient Instructions (Addendum)
Thank you for coming to the office today.  No sign of hernia on the R side. It is the rib cage. It is likely the way the posture is in the wheelchair  Flu Shot today  Handicap Placard signed  You will need a new Endocrinologist for the bone health - Osteoporosis thin bones.  PREVIOUS Endocrinology - [facility] [place], [place]  [address] Phone: [phone]  [facility] - Endocrinology Dr Hipolito Bayley [address] [place], [place] [address] Phone: [phone] for appointments   [facility] ENDOCRINE  Titus Mould, MD Address: [place], [place], [place] [address] Phone: [phone]   Address: [address] [place], [place] [address] Phone: [phone]    Please schedule a Follow-up Appointment to: Return if symptoms worsen or fail to improve.  If you have any other questions or concerns, please feel free to call the office or send a message through [place]. You may also schedule an earlier appointment if necessary.  Additionally, you may be receiving a survey about your experience at our office within a few days to 1 week by e-mail or mail. We value your feedback.  [name], DO [place]

## 2022-08-01 NOTE — Progress Notes (Unsigned)
Subjective:    Patient ID: Pamela Ball, female    DOB: [birth year], 76 y.o.   MRN: [id number]  Pamela Ball is a 76 y.o. female presenting on 08/01/2022 for Hernia   HPI  R upper Quadrant Rib Abnormaltiy Reports noticed this sensation few months ago, not causing pain but bothered her. She has history of cholecystectomy years ago, also has had appendectomy and hysterectomy. She had had other ventral hernia repair 2018-2019 She is not really tender in this area. Reports feels like something there but difficulty describing.  Osteoarthritis bilateral knee History of hip fractures Reduced mobility in need of Handicap placard  Osteoporosis Prior hip fractures Interested in Endocrinology referral now.  Health Maintenance: High dose Flu Shot today  Past Surgical History:  Procedure Laterality Date   ABDOMINAL HYSTERECTOMY  11/06/2006   APPENDECTOMY     BREAST BIOPSY Right 12/14/2020   stereo bx of distortion/asymmetry, coil marker, path pending   BREAST EXCISIONAL BIOPSY Left    age 20- benign    CESAREAN SECTION     x3   COLONOSCOPY WITH PROPOFOL N/A 06/25/2018   Procedure: COLONOSCOPY WITH PROPOFOL;  Surgeon: Jonathon Bellows, MD;  Location: [facility] ENDOSCOPY;  Service: Endoscopy;  Laterality: N/A;   KYPHOPLASTY     KYPHOPLASTY N/A 09/23/2018   Procedure: KYPHOPLASTY T10;  Surgeon: Hessie Knows, MD;  Location: [facility] ORS;  Service: Orthopedics;  Laterality: N/A;   KYPHOPLASTY N/A 03/11/2020   Procedure: L3 KYPHOPLASTY;  Surgeon: Hessie Knows, MD;  Location: [facility];  Service: Orthopedics;  Laterality: N/A;   KYPHOPLASTY N/A 12/30/2020   Procedure: L5 KYPHOPLASTY;  Surgeon: Hessie Knows, MD;  Location: [facility];  Service: Orthopedics;  Laterality: N/A;   ORIF FEMUR FRACTURE Left 06/23/2021   Procedure: OPEN REDUCTION INTERNAL FIXATION (ORIF) DISTAL FEMUR FRACTURE;  Surgeon: Hessie Knows, MD;  Location: [facility];  Service: Orthopedics;  Laterality: Left;   ORIF HUMERUS FRACTURE  Left 08/07/2019   Procedure: OPEN REDUCTION INTERNAL FIXATION (ORIF) LEFT DISTAL HUMERUS FRACTURE;  Surgeon: Altamese Palmer, MD;  Location: [place];  Service: Orthopedics;  Laterality: Left;   ORIF HUMERUS FRACTURE Left 12/23/2021   Procedure: OPEN REDUCTION INTERNAL FIXATION (ORIF) HUMERAL SHAFT FRACTURE;  Surgeon: Leim Fabry, MD;  Location: [facility];  Service: Orthopedics;  Laterality: Left;   REVERSE SHOULDER ARTHROPLASTY Left 12/23/2021   Procedure: ATTEMPTED Left reverse shoulder arthroplasty;  Surgeon: Leim Fabry, MD;  Location: [facility];  Service: Orthopedics;  Laterality: Left;   VENTRAL HERNIA REPAIR N/A 06/02/2017   Procedure: exploratory laparotomy repair of incarcerated  VENTRAL hernia;  Surgeon: Florene Glen, MD;  Location: [facility];  Service: General;  Laterality: N/A;   VENTRAL HERNIA REPAIR N/A 08/29/2018   Procedure: LAPAROSCOPIC INCISIONAL HERNIA;  Surgeon: Olean Ree, MD;  Location: [facility];  Service: General;  Laterality: N/A;        05/25/2022    9:05 AM 02/16/2022   11:08 AM 01/16/2022   11:25 AM  Depression screen PHQ 2/9  Decreased Interest 0 0 0  Down, Depressed, Hopeless 0 0 0  PHQ - 2 Score 0 0 0  Altered sleeping 3 3 0  Tired, decreased energy 0 0 0  Change in appetite 3 3 0  Feeling bad or failure about yourself  0 0 0  Trouble concentrating 0 0 0  Moving slowly or fidgety/restless 0 0 0  Suicidal thoughts 0 0 0  PHQ-9 Score 6 6 0  Difficult doing work/chores Not difficult at all  Not difficult at all Not difficult at all    Social History   Tobacco Use   Smoking status: Never   Smokeless tobacco: Never  Vaping Use   Vaping Use: Never used  Substance Use Topics   Alcohol use: No    Comment: drink occasionally    Drug use: No    Review of Systems Per HPI unless specifically indicated above     Objective:    BP 121/70   Pulse 89   Ht '4\' 10"'$  (1.473 m)   Wt 96 lb 9.6 oz (43.8 kg)   SpO2 92%   BMI 20.19 kg/m   Wt Readings from Last  3 Encounters:  08/01/22 96 lb 9.6 oz (43.8 kg)  07/27/22 91 lb (41.3 kg)  06/15/22 90 lb (40.8 kg)    Physical Exam Vitals and nursing note reviewed.  Constitutional:      General: She is not in acute distress.    Appearance: Normal appearance. She is well-developed. She is not diaphoretic.     Comments: Well-appearing, comfortable, cooperative  HENT:     Head: Normocephalic and atraumatic.  Eyes:     General:        Right eye: No discharge.        Left eye: No discharge.     Conjunctiva/sclera: Conjunctivae normal.  Cardiovascular:     Rate and Rhythm: Normal rate.  Pulmonary:     Effort: Pulmonary effort is normal.  Skin:    General: Skin is warm and dry.     Findings: No erythema or rash.  Neurological:     Mental Status: She is alert and oriented to person, place, and time.  Psychiatric:        Mood and Affect: Mood normal.        Behavior: Behavior normal.        Thought Content: Thought content normal.     Comments: Well groomed, good eye contact, normal speech and thoughts    Results for orders placed or performed during the hospital encounter of 07/27/22  Comprehensive metabolic panel  Result Value Ref Range   Sodium 139 135 - 145 mmol/L   Potassium 4.0 3.5 - 5.1 mmol/L   Chloride 101 98 - 111 mmol/L   CO2 24 22 - 32 mmol/L   Glucose, Bld 80 70 - 99 mg/dL   BUN 19 8 - 23 mg/dL   Creatinine, Ser 0.44 0.44 - 1.00 mg/dL   Calcium 9.5 8.9 - 10.3 mg/dL   Total Protein 7.3 6.5 - 8.1 g/dL   Albumin 4.1 3.5 - 5.0 g/dL   AST 23 15 - 41 U/L   ALT 15 0 - 44 U/L   Alkaline Phosphatase 100 38 - 126 U/L   Total Bilirubin 0.5 0.3 - 1.2 mg/dL   GFR, Estimated >60 >60 mL/min   Anion gap 14 5 - 15  Magnesium  Result Value Ref Range   Magnesium 2.6 (H) 1.7 - 2.4 mg/dL  Vitamin B12  Result Value Ref Range   Vitamin B-12 1,489 (H) 180 - 914 pg/mL  Sedimentation rate  Result Value Ref Range   Sed Rate 2 0 - 30 mm/hr  C-reactive protein  Result Value Ref Range   CRP  0.7 <1.0 mg/dL      Assessment & Plan:   Problem List Items Addressed This Visit     Chronic pain syndrome (Chronic)   Osteoarthritis of knees, bilateral - Primary   Osteoporosis   Other Visit Diagnoses  Needs flu shot       Relevant Orders   Flu Vaccine QUAD High Dose(Fluad)   Rib cage dysfunction           No orders of the defined types were placed in this encounter.  Reassurance No sign of hernia on the R side. It is the rib cage. It is likely the way the posture is in the wheelchair  Flu Shot today  Handicap Placard signed  Osteoporosis Will need Endocrinology referral - advice on various locations in [place], they prefer local or closer to [place]   Follow up plan: Return if symptoms worsen or fail to improve.   [name], [facility] 08/01/2022, 9:34 AM

## 2022-08-03 LAB — 25-HYDROXY VITAMIN D LCMS D2+D3
25-Hydroxy, Vitamin D-2: <1 ng/mL
25-Hydroxy, Vitamin D-3: 53 ng/mL
25-Hydroxy, Vitamin D: 53 ng/mL

## 2022-08-07 ENCOUNTER — Other Ambulatory Visit: Payer: Self-pay | Admitting: Family Medicine

## 2022-08-07 DIAGNOSIS — R591 Generalized enlarged lymph nodes: Secondary | ICD-10-CM

## 2022-08-07 DIAGNOSIS — G8929 Other chronic pain: Secondary | ICD-10-CM

## 2022-08-07 DIAGNOSIS — M170 Bilateral primary osteoarthritis of knee: Secondary | ICD-10-CM

## 2022-08-07 DIAGNOSIS — Z96651 Presence of right artificial knee joint: Secondary | ICD-10-CM

## 2022-08-07 NOTE — Telephone Encounter (Signed)
Requested medications are due for refill today.  yes  Requested medications are on the active medications list.  yes  Last refill. 07/12/2022 #60 0 rf  Future visit scheduled.   no  Notes to clinic.  Refill not delegated.    Requested Prescriptions  Pending Prescriptions Disp Refills   HYDROcodone-acetaminophen (NORCO/VICODIN) 5-325 MG tablet 60 tablet 0    Sig: Take 1 tablet by mouth every 6 (six) hours as needed for moderate pain.     Not Delegated - Analgesics:  Opioid Agonist Combinations Failed - 08/07/2022  1:58 PM      Failed - This refill cannot be delegated      Failed - Urine Drug Screen completed in last 360 days      Passed - Valid encounter within last 3 months    Recent Outpatient Visits           6 days ago Primary osteoarthritis of both knees   [place], DO   1 month ago Hip pain, acute, right   [facility] [place], Coralie Keens, NP   2 months ago Primary osteoarthritis of both knees   [place], DO   6 months ago Left arm pain   [place], DO   7 months ago Venous insufficiency of both lower extremities   [place], [place]

## 2022-08-07 NOTE — Telephone Encounter (Signed)
Medication Refill - Medication: HYDROcodone-acetaminophen (NORCO/VICODIN) 5-325 MG tablet [[id number]]   Has the patient contacted their pharmacy? No. (Agent: If no, request that the patient contact the pharmacy for the refill. If patient does not wish to contact the pharmacy document the reason why and proceed with request.) (Agent: If yes, when and what did the pharmacy advise?)  Preferred Pharmacy (with phone number or street name):  [id number] [place] [id number] Phone: [phone]: [phone] Hours: Not open 24 hours   Has the patient been seen for an appointment in the last year OR does the patient have an upcoming appointment? Yes.    Agent: Please be advised that RX refills may take up to 3 business days. We ask that you follow-up with your pharmacy.

## 2022-08-08 MED ORDER — HYDROCODONE-ACETAMINOPHEN 5-325 MG PO TABS: 1.0000 | ORAL_TABLET | Freq: Four times a day (QID) | ORAL | 0 refills | Status: DC | PRN

## 2022-08-16 DIAGNOSIS — M1650 Unilateral post-traumatic osteoarthritis, unspecified hip: Secondary | ICD-10-CM | POA: Diagnosis not present

## 2022-08-16 DIAGNOSIS — T8484XD Pain due to internal orthopedic prosthetic devices, implants and grafts, subsequent encounter: Secondary | ICD-10-CM | POA: Diagnosis not present

## 2022-08-16 DIAGNOSIS — M1710 Unilateral primary osteoarthritis, unspecified knee: Secondary | ICD-10-CM | POA: Diagnosis not present

## 2022-08-16 DIAGNOSIS — S728X2D Other fracture of left femur, subsequent encounter for closed fracture with routine healing: Secondary | ICD-10-CM | POA: Diagnosis not present

## 2022-08-16 DIAGNOSIS — M1711 Unilateral primary osteoarthritis, right knee: Secondary | ICD-10-CM | POA: Diagnosis not present

## 2022-08-17 ENCOUNTER — Other Ambulatory Visit: Payer: Self-pay | Admitting: Family Medicine

## 2022-08-17 DIAGNOSIS — I10 Essential (primary) hypertension: Secondary | ICD-10-CM

## 2022-08-17 NOTE — Telephone Encounter (Signed)
Refilled 07/31/2022 #90 0 rf - confirmed by same pharmacy. Requested Prescriptions  Pending Prescriptions Disp Refills  . amLODipine (NORVASC) 10 MG tablet [Pharmacy Med Name: AMLODIPINE BESYLATE 10 MG TAB] 90 tablet 0    Sig: TAKE 1 TABLET BY MOUTH EVERY DAY     Cardiovascular: Calcium Channel Blockers 2 Passed - 08/17/2022 10:58 AM      Passed - Last BP in normal range    BP Readings from Last 1 Encounters:  08/01/22 121/70         Passed - Last Heart Rate in normal range    Pulse Readings from Last 1 Encounters:  08/01/22 89         Passed - Valid encounter within last 6 months    Recent Outpatient Visits          2 weeks ago Primary osteoarthritis of both knees   [place], DO   2 months ago Hip pain, acute, right   [facility] [place], Coralie Keens, NP   2 months ago Primary osteoarthritis of both knees   [place], DO   7 months ago Left arm pain   [place], DO   8 months ago Venous insufficiency of both lower extremities   [place], [place]

## 2022-08-21 DIAGNOSIS — M1712 Unilateral primary osteoarthritis, left knee: Secondary | ICD-10-CM | POA: Diagnosis not present

## 2022-08-21 DIAGNOSIS — M1732 Unilateral post-traumatic osteoarthritis, left knee: Secondary | ICD-10-CM | POA: Diagnosis not present

## 2022-08-25 ENCOUNTER — Telehealth: Payer: Self-pay | Admitting: Family Medicine

## 2022-08-25 DIAGNOSIS — G894 Chronic pain syndrome: Secondary | ICD-10-CM

## 2022-08-25 DIAGNOSIS — M170 Bilateral primary osteoarthritis of knee: Secondary | ICD-10-CM

## 2022-08-25 NOTE — Telephone Encounter (Signed)
Patient needs pre-op labs and apt scheduled in November.  Can do non fasting lab then a few days to week later to see me for the Pre-Op Visit and paperwork  Thank you  [name], [facility] 08/25/2022, 5:12 PM

## 2022-08-29 ENCOUNTER — Other Ambulatory Visit: Payer: Self-pay | Admitting: Family Medicine

## 2022-08-29 DIAGNOSIS — R591 Generalized enlarged lymph nodes: Secondary | ICD-10-CM

## 2022-08-29 DIAGNOSIS — G8929 Other chronic pain: Secondary | ICD-10-CM

## 2022-08-29 DIAGNOSIS — M170 Bilateral primary osteoarthritis of knee: Secondary | ICD-10-CM

## 2022-08-29 DIAGNOSIS — Z96651 Presence of right artificial knee joint: Secondary | ICD-10-CM

## 2022-08-29 NOTE — Telephone Encounter (Signed)
Medication Refill - Medication: HYDROcodone-acetaminophen (NORCO/VICODIN) 5-325 MG tablet   Has the patient contacted their pharmacy? yes (Agent: If no, request that the patient contact the pharmacy for the refill. If patient does not wish to contact the pharmacy document the reason why and proceed with request.) (Agent: If yes, when and what did the pharmacy advise?)contact pcp  Preferred Pharmacy (with phone number or street name): [id number] [place] [address]  Phone:  [phone]:  [phone] the patient been seen for an appointment in the last year OR does the patient have an upcoming appointment? yes  Agent: Please be advised that RX refills may take up to 3 business days. We ask that you follow-up with your pharmacy.

## 2022-08-30 MED ORDER — HYDROCODONE-ACETAMINOPHEN 5-325 MG PO TABS: 1.0000 | ORAL_TABLET | Freq: Four times a day (QID) | ORAL | 0 refills | Status: DC | PRN

## 2022-08-30 NOTE — Telephone Encounter (Signed)
Requested medication (s) are due for refill today: yes  Requested medication (s) are on the active medication list: yes  Last refill:  08/08/22 #60  Future visit scheduled: no  Notes to clinic:  medication not delegated to NT to reorder   Requested Prescriptions  Pending Prescriptions Disp Refills   HYDROcodone-acetaminophen (NORCO/VICODIN) 5-325 MG tablet 60 tablet 0    Sig: Take 1 tablet by mouth every 6 (six) hours as needed for moderate pain.     Not Delegated - Analgesics:  Opioid Agonist Combinations Failed - 08/29/2022 11:46 AM      Failed - This refill cannot be delegated      Failed - Urine Drug Screen completed in last 360 days      Passed - Valid encounter within last 3 months    Recent Outpatient Visits           4 weeks ago Primary osteoarthritis of both knees   [place], DO   2 months ago Hip pain, acute, right   [facility] [place], Coralie Keens, NP   3 months ago Primary osteoarthritis of both knees   [place], DO   7 months ago Left arm pain   [place], DO   8 months ago Venous insufficiency of both lower extremities   [place], Devonne Doughty, [place]

## 2022-09-05 NOTE — Progress Notes (Unsigned)
PROVIDER NOTE: Information contained herein reflects review and annotations entered in association with encounter. Interpretation of such information and data should be left to medically-trained personnel. Information provided to Ball can be located elsewhere in Pamela medical record under "Ball Instructions". Document created using STT-dictation technology, any transcriptional errors that may result from process are unintentional.    Ball: Pamela Ball  Service Category: E/M  Provider: Gaspar Cola, MD  DOB: 12-20-1945  DOS: 09/06/2022  Referring Provider: [name] *  MRN: [id number]  Specialty: Interventional Pain Management  PCP: Olin Hauser, DO  Type: Established Ball  Setting: Ambulatory outpatient    Location: Office  Delivery: Face-to-face     Primary Reason(s) for Visit: Encounter for evaluation before starting new chronic pain management plan of care (Level of risk: moderate) CC: No chief complaint on file.  HPI  Pamela Ball is a 76 y.o. year old, female Ball, who comes today for a follow-up evaluation to review Pamela test results and decide on a treatment plan. She has Bipolar 1 disorder, depressed, full remission ([place]); GERD (gastroesophageal reflux disease); Osteoarthritis of knees, bilateral; Hyperlipidemia; Anxiety; Normocytic anemia; Allergic rhinitis due to allergen; COPD (chronic obstructive pulmonary disease) ([place]); Malnutrition of moderate degree; Essential hypertension; Recurrent falls; Venous insufficiency of both lower extremities; Acute blood loss anemia; Bipolar and related disorder ([place]); Blindness, legal; Closed displaced comminuted fracture of shaft of left humerus; Erythrocytosis; History of CVA (cerebrovascular accident); History of non-ST elevation myocardial infarction (NSTEMI); Left acetabular fracture ([place]); MGUS (monoclonal gammopathy of unknown significance); Osteoporosis; S/P reverse total shoulder arthroplasty, right; Fall;  Closed 4-part fracture of proximal end of left humerus with malunion; Chronic pain syndrome; Pharmacologic therapy; Disorder of skeletal system; Problems influencing health status; Chronic knee pain (1ry area of Pain) (Left); Chronic shoulder pain (2ry area of Pain) (Left); and Chronic lower extremity pain (3ry area of Pain) (Left) on their problem list. Her primarily concern today is Pamela No chief complaint on file.  Pain Assessment: Location:     Radiating:   Onset:   Duration:   Quality:   Severity:  /10 (subjective, self-reported pain score)  Effect on ADL:   Timing:   Modifying factors:   BP:    HR:    Pamela Ball comes in today for a follow-up visit after her initial evaluation on 07/27/2022. Today we went over Pamela results of her tests. These were explained in "Layman's terms". During today's appointment we went over my diagnostic impression, as well as Pamela proposed treatment plan.  Review of initial evaluation (07/27/2022): "Pamela Ball comes into Pamela clinic today accompanied by her husband [name]), on a wheelchair.  Pamela Ball refers Pamela primary pain to be that of Pamela left knee which she indicates is about a 9/10.  She refers that this started after one of her many falls where she fractured Pamela bone close to Pamela knee and had to undergo some surgery.  She has continued to have pain in that area and she refers that they have suggested Pamela possibility of a total knee replacement.  They are looking for a second opinion from her orthopedic surgeon that apparently specializes in knee replacements.  Pamela Ball indicates that her surgery after Pamela fracture was done by Dr. Hessie Knows upon follow-up of this persistent pain and indicated that he would suggest removing Pamela hardware however that may end that Pamela Ball may not walk.  They refer 1 and a second opinion from a Dr. Towanda Octave, who is that  knee replacement specialist working for [facility] [place].  Pamela Ball has been having physical therapy for  close to a year at an average of 2/week for approximately 3 months.  This apparently was done at home and then it went down to 1/week after having been stopped and them having requested that updates be restarted.  Pamela Ball refers having had x-rays and MRIs done at [facility] to evaluate all these areas of pain.  In addition to Pamela above, Pamela Ball has a history that is significant for having osteoporosis and multiple falls secondary to being legally blind.  Reviewing her medical records reveals that she has had a left humeral mid shaft comminuted fracture, a left femoral distal intra-articular fracture, a left pubic rami fracture, a left acetabular fracture, and approximately 3 years ago she also had a vertebral body fracture.  Pamela Ball refers that she used to have a "bone doctor" (Dr. Pasty Arch) that would take care of of her osteoporosis treatment, but she left.  They are now trying to get somebody else to take over that treatment.  I have recommended that they concentrate on that since clearly she is Pamela Ball problem with osteoporosis and every time that she is falling she is and then up with multiple fractures.  Pamela Ball's secondary area pain is that of Pamela left shoulder which she indicates having an 8/10.  She refers having decreased range of motion with inability to lift Pamela arm.  According to Pamela medical records she is status post a reverse shoulder arthroplasty.  She also has that history of a left humeral shaft comminuted fracture.  She refers having had Pamela surgery around 2023 and she was transferred to [facility] for further x-rays and surgery.  Apparently Pamela surgery was done at [facility].  Pamela Ball denies any nerve blocks, joint injections, but she does admit to having had some physical therapy for this left upper extremity pain.  She describes Pamela pain as going down from Pamela shoulder to Pamela mid humerus.  She also indicates that there are no further surgeries planned.  Pamela  Ball's third area pain is that of Pamela left lower extremity which she describes also has been an 8/10.  She refers Pamela pain to go from Pamela hip all Pamela way down to her feet.  She has described having falls at a rate of approximately 1/month.  She describes having stopped physical therapy last month.  My initial impression is that Pamela Ball needs to have her osteoporosis aggressively managed.  She also needs assistance at home in setting things up so that she is not tripping and constantly falling.  At this point, with Pamela Ball having as many falls, I do not feel that going through Pamela pharmacological route may be Pamela best option since it could make Pamela Ball more unsteady on her feet, therefore aggravating Pamela situation.  Reviewing Pamela other problems that she has, I do not have any permanent solutions to them, but looking into interventional treatments that have Pamela option of providing her with some temporary relief, I have talked to them about Pamela possibility of a left genicular nerve block, possibly followed by radiofrequency ablation assuming that she gets good relief of Pamela pain, but it does not last.  In a similar manner, we could attempt treating Pamela shoulder pain with left suprascapular nerve blocks, also looking at Pamela possibility of follow-up with radiofrequency ablation.  We are no longer taking patients for medication management and therefore  I will not be looking into prescribing any type of opioid analgesics for this Ball.  Pharmacological review shows that Pamela Ball is taking some hydrocodone/APAP which can continue, as long as it is closely supervised and monitored by Pamela Ball's husband and prescribing physician."  Today we have reviewed Pamela Ball's lab work which was essentially found to be relatively within normal limits except for Pamela vitamin B12 and magnesium levels that were found to be elevated.  ***  Because interventional pain management is my board-certified  specialty, Pamela Ball was informed that joining my practice means that he is open to any and all interventional therapies. I made it clear that this does not mean that they will be forced to have any procedures done. What this means is that I believe interventional therapies to be essential part of Pamela diagnosis and proper management of chronic pain conditions. Therefore, patients not interested in these interventional alternatives will be better served under Pamela care of a different practitioner.  In considering Pamela treatment plan options, Ms. Maines was reminded that I no longer take patients for medication management only. I asked her to let me know if she had no intention of taking advantage of Pamela interventional therapies, so that we could make arrangements to provide this space to someone interested. I also made it clear that undergoing interventional therapies for Pamela purpose of getting pain medications is very inappropriate on Pamela part of a Ball, and it will not be tolerated in this practice. This type of behavior would suggest true addiction and therefore it requires referral to an addiction specialist.   Further details on both, my assessment(s), as well as Pamela proposed treatment plan, please see below.  Controlled Substance Pharmacotherapy Assessment REMS (Risk Evaluation and Mitigation Strategy)  Opioid Analgesic: Hydrocodone/APAP 5/325, 1 tab p.o. 4 times daily (# 60) (last filled on 07/12/2022) MME/day: 20 mg/day  Pill Count: None expected due to no prior prescriptions written by our practice. No notes on file Pharmacokinetics: Liberation and absorption (onset of action): WNL Distribution (time to peak effect): WNL Metabolism and excretion (duration of action): WNL         Pharmacodynamics: Desired effects: Analgesia: Ms. Fiore reports >50% benefit. Functional ability: Ball reports that medication allows her to accomplish basic ADLs Clinically meaningful improvement in  function (CMIF): Sustained CMIF goals met Perceived effectiveness: Described as relatively effective, allowing for increase in activities of daily living (ADL) Undesirable effects: Side-effects or Adverse reactions: None reported Monitoring: [place] PMP: PDMP reviewed during this encounter. Online review of Pamela past 37-monthperiod previously conducted. Not applicable at this point since we have not taken over Pamela Ball's medication management yet. List of other Serum/Urine Drug Screening Test(s):  No results found for: "AMPHSCRSER", "BARBSCRSER", "BENZOSCRSER", "COCAINSCRSER", "COCAINSCRNUR", "PCPSCRSER", "THCSCRSER", "THCU", "CANNABQUANT", "OPIATESCRSER", "OXYSCRSER", "PROPOXSCRSER", "ETH", "CBDTHCR", "D8THCCBX", "D9THCCBX" List of all UDS test(s) done:  Lab Results  Component Value Date   SUMMARY Note 07/27/2022   Last UDS on record: Summary  Date Value Ref Range Status  07/27/2022 Note  Final    Comment:    ==================================================================== Compliance Drug Analysis, Ur ==================================================================== Test                             Result       Flag       Units  Drug Present and Declared for Prescription Verification   Hydrocodone  6162         EXPECTED   ng/mg creat   Dihydrocodeine                 360          EXPECTED   ng/mg creat   Norhydrocodone                 4281         EXPECTED   ng/mg creat    Sources of hydrocodone include scheduled prescription medications.    Dihydrocodeine and norhydrocodone are expected metabolites of    hydrocodone. Dihydrocodeine is also available as a scheduled    prescription medication.    Bupropion                      PRESENT      EXPECTED   Hydroxybupropion               PRESENT      EXPECTED    Hydroxybupropion is an expected metabolite of bupropion.    Acetaminophen                  PRESENT      EXPECTED   Salicylate                     PRESENT       EXPECTED  Drug Present not Declared for Prescription Verification   Venlafaxine                    PRESENT      UNEXPECTED   Desmethylvenlafaxine           PRESENT      UNEXPECTED    Desmethylvenlafaxine is an expected metabolite of venlafaxine.    Olanzapine                     PRESENT      UNEXPECTED  Drug Absent but Declared for Prescription Verification   Alprazolam                     Not Detected UNEXPECTED ng/mg creat ==================================================================== Test                      Result    Flag   Units      Ref Range   Creatinine              42               mg/dL      >=20 ==================================================================== Declared Medications:  Pamela flagging and interpretation on this report are based on Pamela  following declared medications.  Unexpected results may arise from  inaccuracies in Pamela declared medications.   **Note: Pamela testing scope of this panel includes these medications:   Alprazolam (Xanax)  Bupropion (Wellbutrin)  Hydrocodone (Norco)   **Note: Pamela testing scope of this panel does not include small to  moderate amounts of these reported medications:   Acetaminophen (Norco)  Aspirin   **Note: Pamela testing scope of this panel does not include Pamela  following reported medications:   Albuterol (Ventolin HFA)  Amlodipine (Norvasc)  Buspirone (Buspar)  Caffeine  Calcium  Cyanocobalamin  Docusate (Colace)  Furosemide (Lasix)  Iron  Multivitamin  Supplement  Vitamin C  Vitamin D ==================================================================== For clinical consultation, please call [phone]. ====================================================================    UDS interpretation: No unexpected  findings.          Medication Assessment Form: Not applicable. No opioids. Treatment compliance: Not applicable Risk Assessment Profile: Aberrant behavior: See initial evaluations. None  observed or detected today Comorbid factors increasing risk of overdose: See initial evaluation. No additional risks detected today Opioid risk tool (ORT):      No data to display          ORT Scoring interpretation table:  Score <3 = Low Risk for SUD  Score between 4-7 = Moderate Risk for SUD  Score >8 = High Risk for Opioid Abuse   Risk of substance use disorder (SUD): Low  Risk Mitigation Strategies:  Ball opioid safety counseling: No controlled substances prescribed. Ball-Prescriber Agreement (PPA): No agreement signed.  Controlled substance notification to other providers: None required. No opioid therapy.  Pharmacologic Plan: Non-opioid analgesic therapy offered. Interventional alternatives discussed.             Laboratory Chemistry Profile   Renal Lab Results  Component Value Date   BUN 19 07/27/2022   CREATININE 0.44 07/27/2022   BCR 17 06/28/2020   GFRAA 105 06/28/2020   GFRNONAA >60 07/27/2022   SPECGRAV 1.015 02/09/2021   PHUR 7.0 02/09/2021   PROTEINUR NEGATIVE 12/05/2021     Electrolytes Lab Results  Component Value Date   NA 139 07/27/2022   K 4.0 07/27/2022   CL 101 07/27/2022   CALCIUM 9.5 07/27/2022   MG 2.6 (H) 07/27/2022   PHOS 3.1 08/07/2019     Hepatic Lab Results  Component Value Date   AST 23 07/27/2022   ALT 15 07/27/2022   ALBUMIN 4.1 07/27/2022   ALKPHOS 100 07/27/2022   LIPASE 24 12/26/2018     ID Lab Results  Component Value Date   SARSCOV2NAA NEGATIVE 12/27/2021   STAPHAUREUS NEGATIVE 12/05/2021   MRSAPCR NEGATIVE 12/05/2021   HCVAB NEGATIVE 05/21/2017     Bone Lab Results  Component Value Date   VD25OH 57.46 08/07/2019   VD125OH2TOT 71.4 08/07/2019   25OHVITD1 53 07/27/2022   25OHVITD2 <1.0 07/27/2022   25OHVITD3 53 07/27/2022     Endocrine Lab Results  Component Value Date   GLUCOSE 80 07/27/2022   GLUCOSEU NEGATIVE 12/05/2021   HGBA1C 5.1 06/28/2020   TSH 2.66 06/28/2020   FREET4 0.7 (L)  05/21/2017     Neuropathy Lab Results  Component Value Date   VITAMINB12 1,489 (H) 07/27/2022   FOLATE >100.0 03/08/2021   HGBA1C 5.1 06/28/2020     CNS No results found for: "COLORCSF", "APPEARCSF", "RBCCOUNTCSF", "WBCCSF", "POLYSCSF", "LYMPHSCSF", "EOSCSF", "PROTEINCSF", "GLUCCSF", "JCVIRUS", "CSFOLI", "IGGCSF", "LABACHR", "ACETBL"   Inflammation (CRP: Acute  ESR: Chronic) Lab Results  Component Value Date   CRP 0.7 07/27/2022   ESRSEDRATE 2 07/27/2022   LATICACIDVEN 2.0 ([place]) 03/01/2020     Rheumatology No results found for: "RF", "ANA", "LABURIC", "URICUR", "LYMEIGGIGMAB", "LYMEABIGMQN", "HLAB27"   Coagulation Lab Results  Component Value Date   INR 1.0 08/07/2019   LABPROT 13.2 08/07/2019   APTT 30 08/07/2019   PLT 217 12/24/2021     Cardiovascular Lab Results  Component Value Date   BNP 25.0 12/26/2018   CKTOTAL 389 (H) 06/22/2021   TROPONINI 0.57 (HH) 09/27/2018   HGB 8.9 (L) 12/24/2021   HCT 27.6 (L) 12/24/2021     Screening Lab Results  Component Value Date   SARSCOV2NAA NEGATIVE 12/27/2021   STAPHAUREUS NEGATIVE 12/05/2021   MRSAPCR NEGATIVE 12/05/2021   HCVAB NEGATIVE 05/21/2017     Cancer No  results found for: "CEA", "CA125", "LABCA2"   Allergens No results found for: "ALMOND", "APPLE", "ASPARAGUS", "AVOCADO", "BANANA", "BARLEY", "BASIL", "BAYLEAF", "GREENBEAN", "LIMABEAN", "WHITEBEAN", "BEEFIGE", "REDBEET", "BLUEBERRY", "BROCCOLI", "CABBAGE", "MELON", "CARROT", "CASEIN", "CASHEWNUT", "CAULIFLOWER", "CELERY"     Note: Lab results reviewed.  Recent Diagnostic Imaging Review  Cervical Imaging: Cervical MR wo contrast: No results found for this or any previous visit.  Cervical MR wo contrast: No valid procedures specified. Cervical CT wo contrast: Results for orders placed during Pamela hospital encounter of 06/22/21  CT Cervical Spine Wo Contrast  Narrative CLINICAL DATA:  Recent fall with headaches and neck pain,  initial encounter  EXAM: CT HEAD WITHOUT CONTRAST  CT CERVICAL SPINE WITHOUT CONTRAST  TECHNIQUE: Multidetector CT imaging of Pamela head and cervical spine was performed following Pamela standard protocol without intravenous contrast. Multiplanar CT image reconstructions of Pamela cervical spine were also generated.  COMPARISON:  05/29/2020  FINDINGS: CT HEAD FINDINGS  Brain: Mild atrophic changes are noted. Stable lacunar infarct in Pamela right basal ganglia is again noted. No acute hemorrhage or acute infarct is noted.  Vascular: No hyperdense vessel or unexpected calcification.  Skull: Normal. Negative for fracture or focal lesion.  Sinuses/Orbits: Mucosal retention cyst is noted within sphenoid sinus  Other: None  CT CERVICAL SPINE FINDINGS  Alignment: Within normal limits.  Skull base and vertebrae: 7 cervical segments are well visualized. Vertebral body height is well maintained. Multi level disc space narrowing from C4-C7 is noted with osteophytic change. Facet hypertrophic changes are noted as well. No acute fracture or acute facet abnormality is noted.  Soft tissues and spinal canal: Surrounding soft tissue structures are within normal limits.  Upper chest: Visualized lung apices are within normal limits.  Other: None  IMPRESSION: CT of Pamela head: Mild atrophic changes are noted.  Lacunar infarct in Pamela right basal ganglia unchanged from Pamela prior exam  CT of Pamela cervical spine: Multilevel degenerative change without acute abnormality.   Electronically Signed By: Inez Catalina M.D. On: 06/22/2021 23:58  Cervical DG Bending/F/E views: No results found for this or any previous visit.   Shoulder Imaging: Shoulder-R MR wo contrast: No results found for this or any previous visit.  Shoulder-L MR wo contrast: No results found for this or any previous visit.  Shoulder-R DG: No results found for this or any previous visit.  Shoulder-L DG: Results for  orders placed during Pamela hospital encounter of 12/23/21  DG Shoulder Left  Narrative CLINICAL DATA:  Fracture left humerus  EXAM: LEFT SHOULDER - 2+ VIEW  COMPARISON:  Previous studies including Pamela examination of 11/02/2021  FINDINGS: There is new comminuted, displaced fracture in Pamela proximal shaft of left humerus.  IMPRESSION: New comminuted displaced fracture is seen in Pamela proximal shaft of left humerus.   Electronically Signed By: Elmer Picker M.D. On: 12/23/2021 15:00   Thoracic Imaging: Thoracic MR wo contrast: No results found for this or any previous visit.  Thoracic MR wo contrast: No valid procedures specified. Thoracic CT wo contrast: Results for orders placed during Pamela hospital encounter of 09/19/18  CT Thoracic Spine Wo Contrast  Narrative CLINICAL DATA:  77 year old female with back pain. History of multiple compression fractures and vertebral augmentations.  EXAM: CT THORACIC AND LUMBAR SPINE WITHOUT CONTRAST  TECHNIQUE: Multidetector CT imaging of Pamela thoracic and lumbar spine was performed without contrast. Multiplanar CT image reconstructions were also generated.  COMPARISON:  Lumbar radiographs 1004 hours today. CT Abdomen and Pelvis 03/05/2018.  FINDINGS: CT THORACIC  SPINE FINDINGS  Thoracic segmentation: Hypoplastic ribs at T12 but otherwise normal.  Alignment: Mild to moderate dextroconvex thoracic scoliosis. Straightening of thoracic lordosis.  Vertebrae: Osteopenia.  There are mild superior endplate deformities of T4 and T7 which appear nonacute.  There is T10 vertebral body compression with 30% loss of vertebral body height. Pamela inferior endplate deformity has an acute to subacute appearance. Pamela T10 pedicles and posterior elements appear intact. There is minor retropulsion of Pamela posterior inferior T10 endplate. No significant spinal stenosis, despite superimposed degenerative posterior element hypertrophy at that  level.  Pamela remaining thoracic levels including T11 and T12 appear intact.  Chronic left posterior 11th rib fracture at Pamela costovertebral junction. No other posterior rib fracture identified.  Paraspinal and other soft tissues: Calcified aortic atherosclerosis. Calcified coronary artery atherosclerosis. Moderate size gastric hiatal hernia. Otherwise negative visible upper abdominal viscera.  Negative visible lung parenchyma aside from mild atelectasis.  Disc levels: No significant thoracic spinal stenosis.  CT LUMBAR SPINE FINDINGS  Segmentation: Normal and concordant with Pamela thoracic spine numbering.  Alignment: Stable since April. Preserved lower lumbar lordosis. Exaggerated upper lumbar lordosis and focal kyphosis at Pamela thoracolumbar junction related to Pamela previously augmented upper lumbar compression fractures.  Vertebrae: Osteopenia.  Chronic severe L1 compression fracture with stable prior augmentation and bony retropulsion resulting in moderate to severe spinal stenosis (series 8, image 23 and series 12, image 38).  Moderate chronic L2 compression fracture and augmentation is stable with mild bony retropulsion.  L3 through L5, Pamela visible sacrum and SI joints remain intact.  Paraspinal and other soft tissues: Extensive Aortoiliac calcified atherosclerosis. Benign left renal midpole cysts suspected. Negative visualized posterior paraspinal soft tissues.  Disc levels:  Stable and negative lower lumbar disc spaces. Moderate to severe L1 level spinal stenosis related to chronic bony retropulsion appears stable since April.  IMPRESSION: CT THORACIC SPINE IMPRESSION  1. Acute to subacute T10 compression fracture with 30% loss of vertebral body height, minimal endplate retropulsion, and no complicating features. 2. Osteopenia. Chronic appearing mild T4 and T7 compression fractures. 3. Calcified coronary artery atherosclerosis. Chronic hiatal hernia.  CT  LUMBAR SPINE IMPRESSION  1. No acute findings. 2. Osteopenia. Chronic severe L1 compression fracture with prior augmentation and stable bony retropulsion resulting in moderate to severe spinal stenosis. Chronic previously augmented moderate L2 compression fracture. 3.  Aortic Atherosclerosis (ICD10-I70.0).   Electronically Signed By: Genevie Ann M.D. On: 09/19/2018 11:19  Thoracic DG 4 views: No results found for this or any previous visit.  Thoracic DG w/swimmers view: No results found for this or any previous visit.   Lumbosacral Imaging: Lumbar MR wo contrast: Results for orders placed during Pamela hospital encounter of 12/24/20  MR LUMBAR SPINE WO CONTRAST  Narrative CLINICAL DATA:  Sudden onset low back pain radiating to Pamela waist.  EXAM: MRI LUMBAR SPINE WITHOUT CONTRAST  TECHNIQUE: Multiplanar, multisequence MR imaging of Pamela lumbar spine was performed. No intravenous contrast was administered.  COMPARISON:  03/03/2020  FINDINGS: Segmentation:  5 lumbar type vertebrae  Alignment: Kyphotic deformity at Pamela level of L1 due to remote fracture. Otherwise hyperlordosis.  Vertebrae: Marrow edema throughout Pamela L5 body with mild height loss. Edematous signal around Pamela bilateral L5-S1 facets which could be degenerative or secondary to Pamela trauma. No fracture deformity is seen at Pamela posterior elements.  L4 compression deformity with moderate height loss since 2021 comparison, but healed.  Remote T10, L1, L2, and L3 vertebral body fractures with cement augmentation. Height  loss was particularly advanced at L1 with retropulsion and conus compression.  On STIR imaging there is minimally covered hyperintensity at Pamela left sacral ala, not covered on axial slices.  Conus medullaris and cauda equina: Conus extends to Pamela T12-L1 level. Conus and cauda equina appear normal. A small amount of hemorrhage may be present posterior to Pamela L5 body. No complete thecal sac  effacement  Paraspinal and other soft tissues: Atrophy of intrinsic back muscles. Left renal cyst.  Disc levels:  Generalized degenerative facet spurring. Altered disc spaces primarily from old trauma. No degenerative impingement.  IMPRESSION: 1. Acute L5 compression fracture with mild height loss and no retropulsion. 2. Suspect acute left sacral ala insufficiency fracture but only partially covered. 3. L4 compression fracture with moderate height loss, new from April 2021 but healed. 4. Remote T10, L1, L2, and L3 vertebral body fractures. Chronic retropulsion at L1 causing marked spinal stenosis.   Electronically Signed By: Monte Fantasia M.D. On: 12/25/2020 05:49  Lumbar MR wo contrast: No valid procedures specified. Lumbar CT wo contrast: Results for orders placed during Pamela hospital encounter of 09/19/18  CT Lumbar Spine Wo Contrast  Narrative CLINICAL DATA:  76 year old female with back pain. History of multiple compression fractures and vertebral augmentations.  EXAM: CT THORACIC AND LUMBAR SPINE WITHOUT CONTRAST  TECHNIQUE: Multidetector CT imaging of Pamela thoracic and lumbar spine was performed without contrast. Multiplanar CT image reconstructions were also generated.  COMPARISON:  Lumbar radiographs 1004 hours today. CT Abdomen and Pelvis 03/05/2018.  FINDINGS: CT THORACIC SPINE FINDINGS  Thoracic segmentation: Hypoplastic ribs at T12 but otherwise normal.  Alignment: Mild to moderate dextroconvex thoracic scoliosis. Straightening of thoracic lordosis.  Vertebrae: Osteopenia.  There are mild superior endplate deformities of T4 and T7 which appear nonacute.  There is T10 vertebral body compression with 30% loss of vertebral body height. Pamela inferior endplate deformity has an acute to subacute appearance. Pamela T10 pedicles and posterior elements appear intact. There is minor retropulsion of Pamela posterior inferior T10 endplate. No significant spinal  stenosis, despite superimposed degenerative posterior element hypertrophy at that level.  Pamela remaining thoracic levels including T11 and T12 appear intact.  Chronic left posterior 11th rib fracture at Pamela costovertebral junction. No other posterior rib fracture identified.  Paraspinal and other soft tissues: Calcified aortic atherosclerosis. Calcified coronary artery atherosclerosis. Moderate size gastric hiatal hernia. Otherwise negative visible upper abdominal viscera.  Negative visible lung parenchyma aside from mild atelectasis.  Disc levels: No significant thoracic spinal stenosis.  CT LUMBAR SPINE FINDINGS  Segmentation: Normal and concordant with Pamela thoracic spine numbering.  Alignment: Stable since April. Preserved lower lumbar lordosis. Exaggerated upper lumbar lordosis and focal kyphosis at Pamela thoracolumbar junction related to Pamela previously augmented upper lumbar compression fractures.  Vertebrae: Osteopenia.  Chronic severe L1 compression fracture with stable prior augmentation and bony retropulsion resulting in moderate to severe spinal stenosis (series 8, image 23 and series 12, image 38).  Moderate chronic L2 compression fracture and augmentation is stable with mild bony retropulsion.  L3 through L5, Pamela visible sacrum and SI joints remain intact.  Paraspinal and other soft tissues: Extensive Aortoiliac calcified atherosclerosis. Benign left renal midpole cysts suspected. Negative visualized posterior paraspinal soft tissues.  Disc levels:  Stable and negative lower lumbar disc spaces. Moderate to severe L1 level spinal stenosis related to chronic bony retropulsion appears stable since April.  IMPRESSION: CT THORACIC SPINE IMPRESSION  1. Acute to subacute T10 compression fracture with 30% loss of vertebral  body height, minimal endplate retropulsion, and no complicating features. 2. Osteopenia. Chronic appearing mild T4 and T7  compression fractures. 3. Calcified coronary artery atherosclerosis. Chronic hiatal hernia.  CT LUMBAR SPINE IMPRESSION  1. No acute findings. 2. Osteopenia. Chronic severe L1 compression fracture with prior augmentation and stable bony retropulsion resulting in moderate to severe spinal stenosis. Chronic previously augmented moderate L2 compression fracture. 3.  Aortic Atherosclerosis (ICD10-I70.0).   Electronically Signed By: Genevie Ann M.D. On: 09/19/2018 11:19  Lumbar DG Bending views: No results found for this or any previous visit.         Sacroiliac Joint Imaging: Sacroiliac Joint DG: No results found for this or any previous visit.   Hip Imaging: Hip-R MR wo contrast: No results found for this or any previous visit.  Hip-L MR wo contrast: No results found for this or any previous visit.  Hip-R CT wo contrast: No results found for this or any previous visit.  Hip-L CT wo contrast: No results found for this or any previous visit.  Hip-R DG 2-3 views: Results for orders placed during Pamela hospital encounter of 06/15/22  DG Hip Unilat W OR W/O Pelvis 2-3 Views Right  Narrative CLINICAL DATA:  Trauma, fall, pain  EXAM: DG HIP (WITH OR WITHOUT PELVIS) 2-3V RIGHT  COMPARISON:  None Available.  FINDINGS: No recent fracture or dislocation is seen in right hip. Osteopenia is seen in Pamela bony structures. Degenerative changes with small bony spurs are noted. Deformity seen in left pubic bone may be residual from previous injury. There is previous kyphoplasty in few lumbar vertebral bodies. There is suggestion of ventral hernia repair.  IMPRESSION: No recent fracture or dislocation is seen in right hip.  Deformities seen in left pubic bone may be residual from previous injury.   Electronically Signed By: Elmer Picker M.D. On: 06/16/2022 12:04  Hip-L DG 2-3 views: Results for orders placed during Pamela hospital encounter of 06/22/21  DG Hip Unilat W or Wo  Pelvis 2-3 Views Left  Narrative CLINICAL DATA:  Fall and left lower extremity pain.  EXAM: DG HIP (WITH OR WITHOUT PELVIS) 2-3V LEFT; LEFT KNEE - COMPLETE 4+ VIEW; LEFT TIBIA AND FIBULA - 2 VIEW  COMPARISON:  None.  FINDINGS: There is a comminuted, displaced, and mildly impacted fracture of Pamela distal femur. There is dorsal angulation of Pamela distal fracture fragment. There are displaced fractures of Pamela left superior and inferior pubic rami, age indeterminate, likely acute. There is no dislocation. Pamela bones are osteopenic. Lower lumbar vertebroplasty. Ventral hernia repair mesh.  IMPRESSION: 1. Comminuted, displaced and angulated fracture of Pamela distal femur. 2. Displaced fractures of Pamela left superior and inferior pubic rami, likely acute.   Electronically Signed By: Anner Crete M.D. On: 06/23/2021 00:19  Hip-B DG Bilateral: No results found for this or any previous visit.   Knee Imaging: Knee-R MR wo contrast: No results found for this or any previous visit.  Knee-L MR wo contrast: No results found for this or any previous visit.  Knee-R CT wo contrast: No results found for this or any previous visit.  Knee-L CT wo contrast: No results found for this or any previous visit.  Knee-R DG 4 views: No results found for this or any previous visit.  Knee-L DG 4 views: Results for orders placed during Pamela hospital encounter of 06/22/21  DG Knee Complete 4 Views Left  Narrative CLINICAL DATA:  Fall and left lower extremity pain.  EXAM: DG HIP (  WITH OR WITHOUT PELVIS) 2-3V LEFT; LEFT KNEE - COMPLETE 4+ VIEW; LEFT TIBIA AND FIBULA - 2 VIEW  COMPARISON:  None.  FINDINGS: There is a comminuted, displaced, and mildly impacted fracture of Pamela distal femur. There is dorsal angulation of Pamela distal fracture fragment. There are displaced fractures of Pamela left superior and inferior pubic rami, age indeterminate, likely acute. There is no dislocation. Pamela bones are  osteopenic. Lower lumbar vertebroplasty. Ventral hernia repair mesh.  IMPRESSION: 1. Comminuted, displaced and angulated fracture of Pamela distal femur. 2. Displaced fractures of Pamela left superior and inferior pubic rami, likely acute.   Electronically Signed By: Anner Crete M.D. On: 06/23/2021 00:19   Ankle Imaging: Ankle-R DG Complete: No results found for this or any previous visit.  Ankle-L DG Complete: No results found for this or any previous visit.   Foot Imaging: Foot-R DG Complete: No results found for this or any previous visit.  Foot-L DG Complete: No results found for this or any previous visit.   Elbow Imaging: Elbow-R DG Complete: No results found for this or any previous visit.  Elbow-L DG Complete: Results for orders placed during Pamela hospital encounter of 08/07/19  DG Elbow Complete Left  Narrative CLINICAL DATA:  ORIF L distal humerus Dr. Leanne Chang time 34 seconds RSTO performed by Glade Stanford, RT(R)  EXAM: LEFT ELBOW - COMPLETE 3+ VIEW; DG C-ARM 1-60 MIN  COMPARISON:  07/25/2019  FINDINGS: Eleven images are submitted. These demonstrate various stages of open reduction internal fixation of Pamela proximal ulna and distal humerus. Four views are also performed of Pamela LEFT wrist, showing deformity of Pamela distal humerus and significant ulnar plus variance, possibly a remote fracture. No prior exams are available for comparison of Pamela wrist.  IMPRESSION: 1. Status post ORIF of Pamela proximal ulna and distal humerus. 2. Significant ulnar plus variance related to distal radius fracture, likely chronic.   Electronically Signed By: Nolon Nations M.D. On: 08/07/2019 16:03   Wrist Imaging: Wrist-R DG Complete: No results found for this or any previous visit.  Wrist-L DG Complete: Results for orders placed during Pamela hospital encounter of 05/29/20  DG Wrist Complete Left  Narrative CLINICAL DATA:  [place] this morning, wrist  pain  EXAM: LEFT WRIST - COMPLETE 3+ VIEW  COMPARISON:  None.  FINDINGS: Frontal, oblique, lateral, and ulnar deviated views of Pamela left wrist are obtained. There is evidence of a prior healed distal left radial fracture with resulting volar angulation. 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 No acute bony abnormalities. Radiocarpal joint remains intact. Prominent osteoarthritis of Pamela carpometacarpal joints. Bones are osteopenic. Soft tissues are unremarkable.  IMPRESSION: 1. No acute displaced fracture. 2. Sequela of prior healed fracture of Pamela distal left radius. 3. Osteoarthritis carpometacarpal joints.   Electronically Signed By: Randa Ngo M.D. On: 05/29/2020 19:49   Hand Imaging: Hand-R DG Complete: No results found for this or any previous visit.  Hand-L DG Complete: No results found for this or any previous visit.   Complexity Note: Imaging results reviewed.                         Meds   Current Outpatient Medications:    albuterol (VENTOLIN HFA) 108 (90 Base) MCG/ACT inhaler, INHALE 2 PUFFS BY MOUTH EVERY 4 HOURS AS NEEDED FOR WHEEZING OR SHORTNESS OF BREATH (COUGH), Disp: 8.5 each, Rfl: 1   ALPRAZolam (XANAX) 0.5 MG tablet, Take 1 tablet (0.5  mg total) by mouth 2 (two) times daily as needed for anxiety., Disp: 30 tablet, Rfl: 0   amLODipine (NORVASC) 10 MG tablet, TAKE 1 TABLET BY MOUTH EVERY DAY, Disp: 90 tablet, Rfl: 0   Ascorbic Acid (VITAMIN C PO), Take 1 tablet by mouth daily., Disp: , Rfl:    aspirin EC 325 MG EC tablet, Take 1 tablet (325 mg total) by mouth daily., Disp: 30 tablet, Rfl: 0   Aspirin-Caffeine (BC FAST PAIN RELIEF PO), Take 1 packet by mouth 4 (four) times daily as needed (pain)., Disp: , Rfl:    buPROPion (WELLBUTRIN XL) 150 MG 24 hr tablet, Take 150 mg by mouth daily., Disp: , Rfl:    busPIRone (BUSPAR) 10 MG tablet, Take 10 mg by mouth daily. Prescribed by Psychiatry Dr Kasandra Knudsen, Disp: , Rfl:    Calcium-Vitamin D-Vitamin  K ([employer] CALCIUM SOFT CHEWS PO), Take by mouth., Disp: , Rfl:    Cyanocobalamin (B-12 PO), Take 1 capsule by mouth daily., Disp: , Rfl:    docusate sodium (COLACE) 100 MG capsule, Take 1 capsule (100 mg total) by mouth 2 (two) times daily. (Ball taking differently: Take 100 mg by mouth daily.), Disp: 10 capsule, Rfl: 0   feeding supplement (ENSURE ENLIVE / ENSURE PLUS) LIQD, Take 237 mLs by mouth 3 (three) times daily between meals., Disp: 237 mL, Rfl: 12   furosemide (LASIX) 20 MG tablet, TAKE 1 TABLET (20 MG TOTAL) BY MOUTH DAILY AS NEEDED FOR FLUID OR EDEMA., Disp: 90 tablet, Rfl: 1   HYDROcodone-acetaminophen (NORCO/VICODIN) 5-325 MG tablet, Take 1 tablet by mouth every 6 (six) hours as needed for moderate pain., Disp: 60 tablet, Rfl: 0   Multiple Vitamin (MULTIVITAMIN WITH MINERALS) TABS tablet, Take 1 tablet by mouth daily., Disp: , Rfl:    Multiple Vitamins-Minerals (PRESERVISION AREDS PO), Take 1 tablet by mouth daily., Disp: , Rfl:    mupirocin ointment (BACTROBAN) 2 %, Apply 1 application. topically 2 (two) times daily. For 7-10 days, Disp: 30 g, Rfl: 0   Nutritional Supplements (CARNATION BREAKFAST ESSENTIALS PO), Take 1 Dose by mouth daily., Disp: , Rfl:    OLANZapine (ZYPREXA) 7.5 MG tablet, Take 7.5 mg by mouth daily., Disp: , Rfl:    omeprazole (PRILOSEC) 20 MG capsule, TAKE 1 CAPSULE BY MOUTH EVERY DAY BEFORE BREAKFAST, Disp: 90 capsule, Rfl: 1   ondansetron (ZOFRAN-ODT) 4 MG disintegrating tablet, TAKE 1 TABLET BY MOUTH EVERY 8 HOURS AS NEEDED FOR NAUSEA AND VOMITING, Disp: 30 tablet, Rfl: 2   simvastatin (ZOCOR) 20 MG tablet, TAKE 1 TABLET BY MOUTH EVERY DAY, Disp: 90 tablet, Rfl: 1   venlafaxine XR (EFFEXOR-XR) 75 MG 24 hr capsule, Take 75 mg by mouth daily with breakfast. , Disp: , Rfl:    vitamin B-12 1000 MCG tablet, Take 3 tablets (3,000 mcg total) by mouth daily., Disp: , Rfl:    VITAMIN D PO, Take 1 capsule by mouth daily., Disp: , Rfl:   ROS  Constitutional: Denies any  fever or chills Gastrointestinal: No reported hemesis, hematochezia, vomiting, or acute GI distress Musculoskeletal: Denies any acute onset joint swelling, redness, loss of ROM, or weakness Neurological: No reported episodes of acute onset apraxia, aphasia, dysarthria, agnosia, amnesia, paralysis, loss of coordination, or loss of consciousness  Allergies  Ms. Linders is allergic to tape.  PFSH  Drug: Ms. Linam  reports no history of drug use. Alcohol:  reports no history of alcohol use. Tobacco:  reports that she has never smoked. She has never  used smokeless tobacco. Medical:  has a past medical history of Abdominal hernia with obstruction and without gangrene, Anxiety (02/15/2017), Asthma, Bipolar 1 disorder, depressed, full remission ([place]) (02/15/2017), Bipolar affective disorder ([place]), COPD (chronic obstructive pulmonary disease) ([place]), Depression, GERD (gastroesophageal reflux disease) (02/15/2017), Glaucoma, Headache, Hiatal hernia, History of abdominal hernia (05/21/2017), History of compression fracture of spine (02/15/2017), Hyperlipidemia, Hypertension, Incisional hernia, Incisional ventral hernia w obstruction (06/02/2017), Normocytic anemia (05/23/2017), Osteoarthritis of knees, 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 Surgical: Ms. Hornstein  has a past surgical history that includes Abdominal hysterectomy (11/06/2006); Kyphoplasty; Ventral hernia repair (N/A, 06/02/2017); Colonoscopy with propofol (N/A, 06/25/2018); Cesarean section; Kyphoplasty (N/A, 09/23/2018); Ventral hernia repair (N/A, 08/29/2018); ORIF humerus fracture (Left, 08/07/2019); Kyphoplasty (N/A, 03/11/2020); Breast excisional biopsy (Left); Breast biopsy (Right, 12/14/2020); Appendectomy; Kyphoplasty (N/A, 12/30/2020); ORIF femur fracture (Left, 06/23/2021); Reverse shoulder arthroplasty (Left, 12/23/2021); and ORIF humerus fracture (Left, 12/23/2021). Family: family history includes Breast cancer (age of onset:  55) in her mother; Cancer in her mother; Hypertension in her father.  Constitutional Exam  General appearance: Well nourished, well developed, and well hydrated. In no apparent acute distress There were no vitals filed for this visit. BMI Assessment: Estimated body mass index is 20.19 kg/m as calculated from Pamela following:   Height as of 08/01/22: _0  (1.473 m).   Weight as of 08/01/22: 96 lb 9.6 oz (43.8 kg).  BMI interpretation table: BMI level Category Range association with higher incidence of chronic pain  <18 kg/m2 Underweight   18.5-24.9 kg/m2 Ideal body weight   25-29.9 kg/m2 Overweight Increased incidence by 20%  30-34.9 kg/m2 Obese (Class I) Increased incidence by 68%  35-39.9 kg/m2 Severe obesity (Class II) Increased incidence by 136%  >40 kg/m2 Extreme obesity (Class III) Increased incidence by 254%   Ball's current BMI Ideal Body weight  There is no height or weight on file to calculate BMI. Ball weight not recorded   BMI Readings from Last 4 Encounters:  08/01/22 20.19 kg/m  07/27/22 19.02 kg/m  06/15/22 18.81 kg/m  05/25/22 19.06 kg/m   Wt Readings from Last 4 Encounters:  08/01/22 96 lb 9.6 oz (43.8 kg)  07/27/22 91 lb (41.3 kg)  06/15/22 90 lb (40.8 kg)  05/25/22 91 lb 3.2 oz (41.4 kg)    Psych/Mental status: Alert, oriented x 3 (person, place, & time)       Eyes: PERLA Respiratory: No evidence of acute respiratory distress  Assessment & Plan  Primary Diagnosis & Pertinent Problem List: Pamela primary encounter diagnosis was Chronic pain syndrome. Diagnoses of Chronic knee pain (1ry area of Pain) (Left), Chronic shoulder pain (2ry area of Pain) (Left), Pharmacologic therapy, and Chronic use of opiate for therapeutic purpose were also pertinent to this visit.  Visit Diagnosis: 1. Chronic pain syndrome   2. Chronic knee pain (1ry area of Pain) (Left)   3. Chronic shoulder pain (2ry area of Pain) (Left)   4. Pharmacologic therapy   5. Chronic use  of opiate for therapeutic purpose    Problems updated and reviewed during this visit: No problems updated.  Plan of Care  Pharmacotherapy (Medications Ordered): No orders of Pamela defined types were placed in this encounter.  Procedure Orders    No procedure(s) ordered today   Lab Orders  No laboratory test(s) ordered today   Imaging Orders  No imaging studies ordered today   Referral Orders  No referral(s) requested today    Pharmacological management:  Opioid Analgesics: I will not be  prescribing any opioids at this time Membrane stabilizer: I will not be prescribing any at this time Muscle relaxant: I will not be prescribing any at this time NSAID: I will not be prescribing any at this time Other analgesic(s): I will not be prescribing any at this time     Interventional Therapies  Risk  Complexity Considerations:   Estimated body mass index is 19.02 kg/m as calculated from Pamela following:   Height as of this encounter: _0  (1.473 m).   Weight as of this encounter: 91 lb (41.3 kg). WNL   Planned  Pending:      Under consideration:   Diagnostic left genicular NB #1  Possible left genicular nerves RFA #1  Diagnostic left suprascapular NB #1  Possible left suprascapular nerve RFA #1    Completed:   None at this time   Completed by other providers:   None at this time   Therapeutic  Palliative (PRN) options:   None established     Provider-requested follow-up: No follow-ups on file. Recent Visits Date Type Provider Dept  07/27/22 Office Visit Milinda Pointer, MD [facility]  Showing recent visits within past 90 days and meeting all other requirements Future Appointments Date Type Provider Dept  09/06/22 Appointment Milinda Pointer, MD [facility]  Showing future appointments within next 90 days and meeting all other requirements  Primary Care Physician: Olin Hauser, DO Note by: Gaspar Cola, MD Date:  09/06/2022; Time: 8:05 AM

## 2022-09-06 ENCOUNTER — Ambulatory Visit: Payer: Medicare Other | Attending: Pain Medicine | Admitting: Pain Medicine

## 2022-09-06 ENCOUNTER — Encounter: Payer: Self-pay | Admitting: Pain Medicine

## 2022-09-06 VITALS — BP 130/74 | HR 86 | Temp 97.3°F | Resp 16 | Ht <= 58 in | Wt 95.0 lb

## 2022-09-06 DIAGNOSIS — R296 Repeated falls: Secondary | ICD-10-CM | POA: Diagnosis not present

## 2022-09-06 DIAGNOSIS — Z993 Dependence on wheelchair: Secondary | ICD-10-CM | POA: Diagnosis not present

## 2022-09-06 DIAGNOSIS — Z9181 History of falling: Secondary | ICD-10-CM | POA: Diagnosis not present

## 2022-09-06 DIAGNOSIS — M25512 Pain in left shoulder: Secondary | ICD-10-CM | POA: Insufficient documentation

## 2022-09-06 DIAGNOSIS — Z79891 Long term (current) use of opiate analgesic: Secondary | ICD-10-CM | POA: Insufficient documentation

## 2022-09-06 DIAGNOSIS — Z79899 Other long term (current) drug therapy: Secondary | ICD-10-CM | POA: Diagnosis not present

## 2022-09-06 DIAGNOSIS — H548 Legal blindness, as defined in USA: Secondary | ICD-10-CM | POA: Diagnosis not present

## 2022-09-06 DIAGNOSIS — G8929 Other chronic pain: Secondary | ICD-10-CM | POA: Insufficient documentation

## 2022-09-06 DIAGNOSIS — M25562 Pain in left knee: Secondary | ICD-10-CM | POA: Diagnosis not present

## 2022-09-06 DIAGNOSIS — G894 Chronic pain syndrome: Secondary | ICD-10-CM | POA: Insufficient documentation

## 2022-09-06 NOTE — Progress Notes (Signed)
October 19, 2022 doing knee replacement.  Safety precautions to be maintained throughout the outpatient stay will include: orient to surroundings, keep bed in low position, maintain call bell within reach at all times, provide assistance with transfer out of bed and ambulation.

## 2022-09-06 NOTE — Patient Instructions (Signed)
____________________________________________________________________________________________  General Risks and Possible Complications  Patient Responsibilities: It is important that you read this as it is part of your informed consent. It is our duty to inform you of the risks and possible complications associated with treatments offered to you. It is your responsibility as a patient to read this and to ask questions about anything that is not clear or that you believe was not covered in this document.  Patient's Rights: You have the right to refuse treatment. You also have the right to change your mind, even after initially having agreed to have the treatment done. However, under this last option, if you wait until the last second to change your mind, you may be charged for the materials used up to that point.  Introduction: Medicine is not an exact science. Everything in Medicine, including the lack of treatment(s), carries the potential for danger, harm, or loss (which is by definition: Risk). In Medicine, a complication is a secondary problem, condition, or disease that can aggravate an already existing one. All treatments carry the risk of possible complications. The fact that a side effects or complications occurs, does not imply that the treatment was conducted incorrectly. It must be clearly understood that these can happen even when everything is done following the highest safety standards.  No treatment: You can choose not to proceed with the proposed treatment alternative. The "PRO(s)" would include: avoiding the risk of complications associated with the therapy. The "CON(s)" would include: not getting any of the treatment benefits. These benefits fall under one of three categories: diagnostic; therapeutic; and/or palliative. Diagnostic benefits include: getting information which can ultimately lead to improvement of the disease or symptom(s). Therapeutic benefits are those associated with the  successful treatment of the disease. Finally, palliative benefits are those related to the decrease of the primary symptoms, without necessarily curing the condition (example: decreasing the pain from a flare-up of a chronic condition, such as incurable terminal cancer).  General Risks and Complications: These are associated to most interventional treatments. They can occur alone, or in combination. They fall under one of the following six (6) categories: no benefit or worsening of symptoms; bleeding; infection; nerve damage; allergic reactions; and/or death. No benefits or worsening of symptoms: In Medicine there are no guarantees, only probabilities. No healthcare provider can ever guarantee that a medical treatment will work, they can only state the probability that it may. Furthermore, there is always the possibility that the condition may worsen, either directly, or indirectly, as a consequence of the treatment. Bleeding: This is more common if the patient is taking a blood thinner, either prescription or over the counter (example: Goody Powders, Fish oil, Aspirin, Garlic, etc.), or if suffering a condition associated with impaired coagulation (example: Hemophilia, cirrhosis of the liver, low platelet counts, etc.). However, even if you do not have one on these, it can still happen. If you have any of these conditions, or take one of these drugs, make sure to notify your treating physician. Infection: This is more common in patients with a compromised immune system, either due to disease (example: diabetes, cancer, human immunodeficiency virus [HIV], etc.), or due to medications or treatments (example: therapies used to treat cancer and rheumatological diseases). However, even if you do not have one on these, it can still happen. If you have any of these conditions, or take one of these drugs, make sure to notify your treating physician. Nerve Damage: This is more common when the treatment is an invasive    one, but it can also happen with the use of medications, such as those used in the treatment of cancer. The damage can occur to small secondary nerves, or to large primary ones, such as those in the spinal cord and brain. This damage may be temporary or permanent and it may lead to impairments that can range from temporary numbness to permanent paralysis and/or brain death. Allergic Reactions: Any time a substance or material comes in contact with our body, there is the possibility of an allergic reaction. These can range from a mild skin rash (contact dermatitis) to a severe systemic reaction (anaphylactic reaction), which can result in death. Death: In general, any medical intervention can result in death, most of the time due to an unforeseen complication. ____________________________________________________________________________________________  ______________________________________________________________________  Preparing for your procedure  During your procedure appointment there will be: No Prescription Refills. No disability issues to discussed. No medication changes or discussions.  Instructions: Food intake: Avoid eating anything solid for at least 8 hours prior to your procedure. Clear liquid intake: You may take clear liquids such as water up to 2 hours prior to your procedure. (No carbonated drinks. No soda.) Transportation: Unless otherwise stated by your physician, bring a driver. Morning Medicines: Except for blood thinners, take all of your other morning medications with a sip of water. Make sure to take your heart and blood pressure medicines. If your blood pressure's lower number is above 100, the case will be rescheduled. Blood thinners: If you take a blood thinner, but were not instructed to stop it, call our office [phone] and ask to talk to a nurse. Not stopping a blood thinner prior to certain procedures could lead to serious complications. Diabetics on insulin:  Notify the staff so that you can be scheduled 1st case in the morning. If your diabetes requires high dose insulin, take only  of your normal insulin dose the morning of the procedure and notify the staff that you have done so. Preventing infections: Shower with an antibacterial soap the morning of your procedure.  Build-up your immune system: Take 1000 mg of Vitamin C with every meal (3 times a day) the day prior to your procedure. Antibiotics: Inform the nursing staff if you are taking any antibiotics or if you have any conditions that may require antibiotics prior to procedures. (Example: recent joint implants)   Pregnancy: If you are pregnant make sure to notify the nursing staff. Not doing so may result in injury to the fetus, including death.  Sickness: If you have a cold, fever, or any active infections, call and cancel or reschedule your procedure. Receiving steroids while having an infection may result in complications. Arrival: You must be in the facility at least 30 minutes prior to your scheduled procedure. Tardiness: Your scheduled time is also the cutoff time. If you do not arrive at least 15 minutes prior to your procedure, you will be rescheduled.  Children: Do not bring any children with you. Make arrangements to keep them home. Dress appropriately: There is always a possibility that your clothing may get soiled. Avoid long dresses. Valuables: Do not bring any jewelry or valuables.  Reasons to call and reschedule or cancel your procedure: (Following these recommendations will minimize the risk of a serious complication.) Surgeries: Avoid having procedures within 2 weeks of any surgery. (Avoid for 2 weeks before or after any surgery). Flu Shots: Avoid having procedures within 2 weeks of a flu shots or . (Avoid for 2 weeks before or  after immunizations). Barium: Avoid having a procedure within 7-10 days after having had a radiological study involving the use of radiological contrast.  (Myelograms, Barium swallow or enema study). Heart attacks: Avoid any elective procedures or surgeries for the initial 6 months after a "Myocardial Infarction" (Heart Attack). Blood thinners: It is imperative that you stop these medications before procedures. Let us know if you if you take any blood thinner.  Infection: Avoid procedures during or within two weeks of an infection (including chest colds or gastrointestinal problems). Symptoms associated with infections include: Localized redness, fever, chills, night sweats or profuse sweating, burning sensation when voiding, cough, congestion, stuffiness, runny nose, sore throat, diarrhea, nausea, vomiting, cold or Flu symptoms, recent or current infections. It is specially important if the infection is over the area that we intend to treat. Heart and lung problems: Symptoms that may suggest an active cardiopulmonary problem include: cough, chest pain, breathing difficulties or shortness of breath, dizziness, ankle swelling, uncontrolled high or unusually low blood pressure, and/or palpitations. If you are experiencing any of these symptoms, cancel your procedure and contact your primary care physician for an evaluation.  Remember:  Regular Business hours are:  Monday to Thursday 8:00 AM to 4:00 PM  Provider's Schedule: Milinda Pointer, MD:  Procedure days: Tuesday and Thursday 7:30 AM to 4:00 PM  Gillis Santa, MD:  Procedure days: Monday and Wednesday 7:30 AM to 4:00 PM  ______________________________________________________________________

## 2022-09-20 ENCOUNTER — Telehealth: Payer: Self-pay | Admitting: Family Medicine

## 2022-09-20 MED ORDER — HYDROCODONE-ACETAMINOPHEN 5-325 MG PO TABS: 1.0000 | ORAL_TABLET | Freq: Four times a day (QID) | ORAL | 0 refills | Status: DC | PRN

## 2022-09-20 NOTE — Telephone Encounter (Signed)
Called pt, LVMTCB to disclose message from provider.

## 2022-09-20 NOTE — Telephone Encounter (Signed)
Received request for Hydrocodone already and have sent this rx to pharmacy. Instead of Oxycodone.  Last visit with pain doctors documented Hydrocodone, not Oxycodone.  Please let me know if this is unavailable or if need to be changed  [name], [facility] 09/20/2022, 3:58 PM

## 2022-09-20 NOTE — Telephone Encounter (Signed)
Copied from [place] [id number]. Topic: General - Other >> Sep 20, 2022  2:39 PM Everette C wrote: Reason for CRM: Medication Refill - Medication: oxyCODONE (Oxy IR/ROXICODONE) immediate release tablet 5 mg  Has the patient contacted their pharmacy? No. (Agent: If no, request that the patient contact the pharmacy for the refill. If patient does not wish to contact the pharmacy document the reason why and proceed with request.) (Agent: If yes, when and what did the pharmacy advise?)  Preferred Pharmacy (with phone number or street name): [id number] [address] [id number]: [phone]: [phone]: Not open 24 hours  Has the patient been seen for an appointment in the last year OR does the patient have an upcoming appointment? Yes.    Agent: Please be advised that RX refills may take up to 3 business days. We ask that you follow-up with your pharmacy.

## 2022-09-20 NOTE — Telephone Encounter (Signed)
He said that she needs a refill on hydrocodone. Disregard if it's already been filled. [facility] [place]

## 2022-09-20 NOTE — Addendum Note (Signed)
Addended by: Olin Hauser on: 09/20/2022 03:55 PM   Modules accepted: Orders

## 2022-09-20 NOTE — Telephone Encounter (Signed)
Refilled  [name], DO [facility] 09/20/2022, 3:55 PM

## 2022-09-21 NOTE — Telephone Encounter (Signed)
Called patient to given provider's recommendations. No answer. Unable to left VM, it was full.

## 2022-09-21 NOTE — Telephone Encounter (Signed)
Pts spouse returning call and states they are aware of provider's recommendations

## 2022-10-12 ENCOUNTER — Other Ambulatory Visit: Payer: Self-pay | Admitting: Family Medicine

## 2022-10-12 DIAGNOSIS — G894 Chronic pain syndrome: Secondary | ICD-10-CM

## 2022-10-12 DIAGNOSIS — M170 Bilateral primary osteoarthritis of knee: Secondary | ICD-10-CM

## 2022-10-12 NOTE — Telephone Encounter (Signed)
Medication Refill - Medication: Hydrocodone 5/325  Has the patient contacted their pharmacy? No. (Agent: If no, request that the patient contact the pharmacy for the refill. If patient does not wish to contact the pharmacy document the reason why and proceed with request.) (Agent: If yes, when and what did the pharmacy advise?)  Preferred Pharmacy (with phone number or street name): [facility] [place] Has the patient been seen for an appointment in the last year OR does the patient have an upcoming appointment? Yes.    Agent: Please be advised that RX refills may take up to 3 business days. We ask that you follow-up with your pharmacy.  

## 2022-10-12 NOTE — Telephone Encounter (Signed)
Requested medications are due for refill today.  yes  Requested medications are on the active medications list.  yes  Last refill. 09/20/2022 #60 0 rf  Future visit scheduled.   yes  Notes to clinic.  Refill not delegated.    Requested Prescriptions  Pending Prescriptions Disp Refills   HYDROcodone-acetaminophen (NORCO/VICODIN) 5-325 MG tablet 60 tablet 0    Sig: Take 1 tablet by mouth every 6 (six) hours as needed for moderate pain.     Not Delegated - Analgesics:  Opioid Agonist Combinations Failed - 10/12/2022  4:38 PM      Failed - This refill cannot be delegated      Failed - Urine Drug Screen completed in last 360 days      Passed - Valid encounter within last 3 months    Recent Outpatient Visits           2 months ago Primary osteoarthritis of both knees   [place], DO   3 months ago Hip pain, acute, right   [facility] [place], Coralie Keens, NP   4 months ago Primary osteoarthritis of both knees   [place], DO   8 months ago Left arm pain   [place], DO   10 months ago Venous insufficiency of both lower extremities   [place], DO       Future Appointments             In 1 week Parks Ranger, Devonne Doughty, DO [facility], [facility]

## 2022-10-13 MED ORDER — HYDROCODONE-ACETAMINOPHEN 5-325 MG PO TABS: 1.0000 | ORAL_TABLET | Freq: Four times a day (QID) | ORAL | 0 refills | Status: DC | PRN

## 2022-10-19 ENCOUNTER — Ambulatory Visit (INDEPENDENT_AMBULATORY_CARE_PROVIDER_SITE_OTHER): Payer: Medicare Other | Admitting: Family Medicine

## 2022-10-19 ENCOUNTER — Ambulatory Visit
Admission: RE | Admit: 2022-10-19 | Discharge: 2022-10-19 | Disposition: A | Discharge status: Home / Self Care | Payer: Medicare Other | Source: Ambulatory Visit | Attending: Family Medicine | Admitting: Family Medicine

## 2022-10-19 ENCOUNTER — Ambulatory Visit
Admission: RE | Admit: 2022-10-19 | Discharge: 2022-10-19 | Disposition: A | Discharge status: Home / Self Care | Payer: Medicare Other | Attending: Family Medicine | Admitting: Family Medicine

## 2022-10-19 ENCOUNTER — Encounter: Payer: Self-pay | Admitting: Family Medicine

## 2022-10-19 VITALS — BP 128/66 | HR 82 | Ht <= 58 in | Wt 95.0 lb

## 2022-10-19 DIAGNOSIS — M25562 Pain in left knee: Secondary | ICD-10-CM | POA: Diagnosis not present

## 2022-10-19 DIAGNOSIS — Z01818 Encounter for other preprocedural examination: Secondary | ICD-10-CM

## 2022-10-19 DIAGNOSIS — J432 Centrilobular emphysema: Secondary | ICD-10-CM

## 2022-10-19 DIAGNOSIS — M170 Bilateral primary osteoarthritis of knee: Secondary | ICD-10-CM

## 2022-10-19 DIAGNOSIS — G8929 Other chronic pain: Secondary | ICD-10-CM | POA: Diagnosis not present

## 2022-10-19 LAB — TEST AUTHORIZATION: TEST CODE:: 16802

## 2022-10-19 NOTE — Progress Notes (Signed)
Subjective:    Patient ID: Pamela Ball, female    DOB: May 18, 1946, 76 y.o.   MRN: [id number]  Pamela Ball is a 76 y.o. female presenting on 10/19/2022 for surgical clearance   HPI  PRE-OPERATIVE MEDICAL CLEARANCE Anticipated upcoming surgery - Left Total Knee Replacement, by Dr Lorri Frederick (at [facility]) in approximately 4 weeks on 11/16/22  - Today patient reports L knee pain swelling. Awaiting surgery  Regarding surgical and anesthesia history: - Known history of several major surgeries in past, and has tolerated general anesthesia well without problem, complication or allergy. No family history of problem with anesthesia - Able to tolerate regular exercise up to >4 METs, walking up flight of stairs - No known history of cardiovascular disease. Never had MI or known CAD. - COPD Emphysema. Controlled. Never smoker. - Denies exertional symptoms of chest pain or tightness, dyspnea, coughing, apnea, syncopal episodes, palpitations     05/25/2022    9:05 AM 02/16/2022   11:08 AM 01/16/2022   11:25 AM  Depression screen PHQ 2/9  Decreased Interest 0 0 0  Down, Depressed, Hopeless 0 0 0  PHQ - 2 Score 0 0 0  Altered sleeping 3 3 0  Tired, decreased energy 0 0 0  Change in appetite 3 3 0  Feeling bad or failure about yourself  0 0 0  Trouble concentrating 0 0 0  Moving slowly or fidgety/restless 0 0 0  Suicidal thoughts 0 0 0  PHQ-9 Score 6 6 0  Difficult doing work/chores Not difficult at all Not difficult at all Not difficult at all    Social History   Tobacco Use   Smoking status: Never   Smokeless tobacco: Never  Vaping Use   Vaping Use: Never used  Substance Use Topics   Alcohol use: No    Comment: drink occasionally    Drug use: No    Review of Systems Per HPI unless specifically indicated above     Objective:    BP 128/66   Pulse 82   Ht '4\' 10"'$  (1.473 m)   Wt 95 lb (43.1 kg)   SpO2 95%   BMI 19.86 kg/m   Wt Readings from Last 3  Encounters:  10/19/22 95 lb (43.1 kg)  09/06/22 95 lb (43.1 kg)  08/01/22 96 lb 9.6 oz (43.8 kg)    Physical Exam Vitals and nursing note reviewed.  Constitutional:      General: She is not in acute distress.    Appearance: Normal appearance. She is well-developed. She is not diaphoretic.     Comments: Frail elderly 76 yr female, currently mostly comfortable but some pain from knee and back, cooperative  HENT:     Head: Normocephalic and atraumatic.  Eyes:     General:        Right eye: No discharge.        Left eye: No discharge.     Conjunctiva/sclera: Conjunctivae normal.  Cardiovascular:     Rate and Rhythm: Normal rate.  Pulmonary:     Effort: Pulmonary effort is normal.  Musculoskeletal:     Comments: Wheelchair L knee reduced range of motion, pain effusion  Skin:    General: Skin is warm and dry.     Findings: No erythema or rash.  Neurological:     Mental Status: She is alert and oriented to person, place, and time.  Psychiatric:        Mood and Affect: Mood normal.  Behavior: Behavior normal.        Thought Content: Thought content normal.     Comments: Well groomed, good eye contact, normal speech and thoughts       Results for orders placed or performed during the hospital encounter of 07/27/22  Comprehensive metabolic panel  Result Value Ref Range   Sodium 139 135 - 145 mmol/L   Potassium 4.0 3.5 - 5.1 mmol/L   Chloride 101 98 - 111 mmol/L   CO2 24 22 - 32 mmol/L   Glucose, Bld 80 70 - 99 mg/dL   BUN 19 8 - 23 mg/dL   Creatinine, Ser 0.44 0.44 - 1.00 mg/dL   Calcium 9.5 8.9 - 10.3 mg/dL   Total Protein 7.3 6.5 - 8.1 g/dL   Albumin 4.1 3.5 - 5.0 g/dL   AST 23 15 - 41 U/L   ALT 15 0 - 44 U/L   Alkaline Phosphatase 100 38 - 126 U/L   Total Bilirubin 0.5 0.3 - 1.2 mg/dL   GFR, Estimated >60 >60 mL/min   Anion gap 14 5 - 15  Magnesium  Result Value Ref Range   Magnesium 2.6 (H) 1.7 - 2.4 mg/dL  Vitamin B12  Result Value Ref Range   Vitamin  B-12 1,489 (H) 180 - 914 pg/mL  Sedimentation rate  Result Value Ref Range   Sed Rate 2 0 - 30 mm/hr  25-Hydroxy vitamin D Lcms D2+D3  Result Value Ref Range   25-Hydroxy, Vitamin D 53 ng/mL   25-Hydroxy, Vitamin D-2 <1.0 ng/mL   25-Hydroxy, Vitamin D-3 53 ng/mL  C-reactive protein  Result Value Ref Range   CRP 0.7 <1.0 mg/dL      Assessment & Plan:   Problem List Items Addressed This Visit     Chronic knee pain (1ry area of Pain) (Left) (Chronic)   COPD (chronic obstructive pulmonary disease) (HCC)   Relevant Orders   DG Chest 2 View   Osteoarthritis of knees, bilateral   Other Visit Diagnoses     Pre-op evaluation    -  Primary   Relevant Orders   COMPLETE METABOLIC PANEL WITH GFR   CBC with Differential/Platelet   Hemoglobin A1c   DG Chest 2 View       No orders of the defined types were placed in this encounter.  Pre-op clearance for non-cardiac surgery today, Left Knee Total Arthroplasty  (intermediate risk), general anesthesia. - Previously tolerated prior surgeries with general anesthesia - No known cardiac hx. Appropriate functional status >4 METs - Controlled COPD  Plan: 1. Cleared for elective surgery. Completed provided pre-op paperwork per Orthopedic office 2. Will check BMET for Cr monitoring / CBC for Hgb / A1c as requested 3. Check Chest X-ray today 4. Copy forward last EKG / Urinalysis   Follow up plan: Return if symptoms worsen or fail to improve.   [name], DO [facility] 10/19/2022, 10:08 AM

## 2022-10-19 NOTE — Patient Instructions (Addendum)
Thank you for coming to the office today.  Pre op testing today with labs chemistry, blood count, sugar A1c, and Chest X-ray  Will complete the paperwork and send to Dr Lorri Frederick to proceed  If all testing is normal, then you are cleared for surgery.  Let me know if need any renewal on the pain medication before surgery.  Please schedule a Follow-up Appointment to: Return if symptoms worsen or fail to improve.  If you have any other questions or concerns, please feel free to call the office or send a message through [place]. You may also schedule an earlier appointment if necessary.  Additionally, you may be receiving a survey about your experience at our office within a few days to 1 week by e-mail or mail. We value your feedback.  [name], DO [place]

## 2022-10-23 ENCOUNTER — Other Ambulatory Visit: Payer: Medicare Other

## 2022-10-23 DIAGNOSIS — Z01818 Encounter for other preprocedural examination: Secondary | ICD-10-CM | POA: Diagnosis not present

## 2022-10-23 DIAGNOSIS — D62 Acute posthemorrhagic anemia: Secondary | ICD-10-CM | POA: Diagnosis not present

## 2022-10-23 DIAGNOSIS — E440 Moderate protein-calorie malnutrition: Secondary | ICD-10-CM | POA: Diagnosis not present

## 2022-10-23 DIAGNOSIS — R7309 Other abnormal glucose: Secondary | ICD-10-CM | POA: Diagnosis not present

## 2022-10-23 DIAGNOSIS — D751 Secondary polycythemia: Secondary | ICD-10-CM | POA: Diagnosis not present

## 2022-10-24 LAB — CBC WITH DIFFERENTIAL/PLATELET
Absolute Monocytes: 566 cells/uL (ref 200–950)
Basophils Absolute: 30 cells/uL (ref 0–200)
Basophils Relative: 0.5 %
Eosinophils Absolute: 53 cells/uL (ref 15–500)
Eosinophils Relative: 0.9 %
HCT: 43.6 % (ref 35.0–45.0)
Hemoglobin: 14.9 g/dL (ref 11.7–15.5)
Lymphs Abs: 1333 cells/uL (ref 850–3900)
MCH: 33 pg (ref 27.0–33.0)
MCHC: 34.2 g/dL (ref 32.0–36.0)
MCV: 96.5 fL (ref 80.0–100.0)
MPV: 10.3 fL (ref 7.5–12.5)
Monocytes Relative: 9.6 %
Neutro Abs: 3918 cells/uL (ref 1500–7800)
Neutrophils Relative %: 66.4 %
Platelets: 235 10*3/uL (ref 140–400)
RBC: 4.52 10*6/uL (ref 3.80–5.10)
RDW: 11.9 % (ref 11.0–15.0)
Total Lymphocyte: 22.6 %
WBC: 5.9 10*3/uL (ref 3.8–10.8)

## 2022-10-24 LAB — COMPLETE METABOLIC PANEL WITH GFR
AG Ratio: 1.8 (calc) (ref 1.0–2.5)
ALT: 13 U/L (ref 6–29)
AST: 19 U/L (ref 10–35)
Albumin: 4.3 g/dL (ref 3.6–5.1)
Alkaline phosphatase (APISO): 93 U/L (ref 37–153)
BUN/Creatinine Ratio: 21 (calc) (ref 6–22)
BUN: 12 mg/dL (ref 7–25)
CO2: 33 mmol/L — ABNORMAL HIGH (ref 20–32)
Calcium: 9.6 mg/dL (ref 8.6–10.4)
Chloride: 99 mmol/L (ref 98–110)
Creat: 0.56 mg/dL — ABNORMAL LOW (ref 0.60–1.00)
Globulin: 2.4 g/dL (calc) (ref 1.9–3.7)
Glucose, Bld: 118 mg/dL — ABNORMAL HIGH (ref 65–99)
Potassium: 4.6 mmol/L (ref 3.5–5.3)
Sodium: 140 mmol/L (ref 135–146)
Total Bilirubin: 0.4 mg/dL (ref 0.2–1.2)
Total Protein: 6.7 g/dL (ref 6.1–8.1)
eGFR: 94 mL/min/{1.73_m2} (ref >=60)

## 2022-10-24 LAB — TEST AUTHORIZATION: TEST CODE:: 16802

## 2022-10-24 LAB — HEMOGLOBIN A1C
Hgb A1c MFr Bld: 5.5 % of total Hgb (ref <5.7)
Mean Plasma Glucose: 111 mg/dL
eAG (mmol/L): 6.2 mmol/L

## 2022-10-25 ENCOUNTER — Other Ambulatory Visit: Payer: Self-pay | Admitting: Family Medicine

## 2022-10-25 DIAGNOSIS — J432 Centrilobular emphysema: Secondary | ICD-10-CM

## 2022-10-25 DIAGNOSIS — J441 Chronic obstructive pulmonary disease with (acute) exacerbation: Secondary | ICD-10-CM

## 2022-10-25 NOTE — Telephone Encounter (Signed)
Requested Prescriptions  Pending Prescriptions Disp Refills   albuterol (VENTOLIN HFA) 108 (90 Base) MCG/ACT inhaler [Pharmacy Med Name: ALBUTEROL HFA (PROAIR) INHALER] 8.5 each 1    Sig: INHALE 2 PUFFS BY MOUTH EVERY 4 HOURS AS NEEDED FOR WHEEZING OR SHORTNESS OF BREATH (COUGH).     Pulmonology:  Beta Agonists 2 Passed - 10/25/2022  8:55 AM      Passed - Last BP in normal range    BP Readings from Last 1 Encounters:  10/19/22 128/66         Passed - Last Heart Rate in normal range    Pulse Readings from Last 1 Encounters:  10/19/22 82         Passed - Valid encounter within last 12 months    Recent Outpatient Visits           6 days ago Pre-op evaluation   [place], DO   2 months ago Primary osteoarthritis of both knees   [place], DO   4 months ago Hip pain, acute, right   [facility] [place], Coralie Keens, NP   5 months ago Primary osteoarthritis of both knees   [place], DO   9 months ago Left arm pain   [facility], Devonne Doughty, [place]

## 2022-10-27 ENCOUNTER — Other Ambulatory Visit: Payer: Self-pay | Admitting: Family Medicine

## 2022-10-27 DIAGNOSIS — K219 Gastro-esophageal reflux disease without esophagitis: Secondary | ICD-10-CM

## 2022-10-27 DIAGNOSIS — M1732 Unilateral post-traumatic osteoarthritis, left knee: Secondary | ICD-10-CM | POA: Diagnosis not present

## 2022-10-27 NOTE — Telephone Encounter (Signed)
Requested Prescriptions  Pending Prescriptions Disp Refills   omeprazole (PRILOSEC) 20 MG capsule [Pharmacy Med Name: OMEPRAZOLE DR 20 MG CAPSULE] 90 capsule 1    Sig: TAKE 1 CAPSULE BY MOUTH EVERY DAY BEFORE BREAKFAST     Gastroenterology: Proton Pump Inhibitors Passed - 10/27/2022  2:27 AM      Passed - Valid encounter within last 12 months    Recent Outpatient Visits           1 week ago Pre-op evaluation   [place], DO   2 months ago Primary osteoarthritis of both knees   [place], DO   4 months ago Hip pain, acute, right   [facility] [place], Coralie Keens, NP   5 months ago Primary osteoarthritis of both knees   [place], DO   9 months ago Left arm pain   [facility] [place], Devonne Doughty, [place]

## 2022-10-31 ENCOUNTER — Telehealth: Payer: Self-pay | Admitting: Family Medicine

## 2022-10-31 ENCOUNTER — Ambulatory Visit: Payer: Self-pay | Admitting: *Deleted

## 2022-10-31 DIAGNOSIS — M25511 Pain in right shoulder: Secondary | ICD-10-CM | POA: Diagnosis not present

## 2022-10-31 DIAGNOSIS — M19011 Primary osteoarthritis, right shoulder: Secondary | ICD-10-CM | POA: Diagnosis not present

## 2022-10-31 NOTE — Telephone Encounter (Signed)
  Chief Complaint: [place] on Saturday in bathroom.   Sore right collarbone with a lump and it hurts to move her right arm.   Also bruised her right knee Symptoms: Has a lump on the collarbone closest to her neck and pain.   Bruise on her right knee but able to bend it and walk without difficulty. Frequency: Happened Saturday Pertinent Negatives: Patient denies dizziness or loss of consciousness.   Didn't reach the toilet arm in time while reaching out for it and lost her balance. Disposition: '[]'$ ED /'[x]'$ Urgent Care (no appt availability in office) / '[]'$ Appointment(In office/virtual)/ '[]'$  [facility]/ '[]'$ Home Care/ '[]'$ Refused Recommended Disposition /'[]'$ [place] [facility]/ '[]'$  Follow-up with PCP Additional Notes: No appts. In [place] for today.    I referred her to the urgent care for x rays of her collarbone.    A family member was on the line and asked if she could go to her orthopedic dr at [facility].     I let them know they have a walk in orthopedic clinic there in the office called [facility].   They were agreeable to going there instead of to the urgent care or ED.   She is a pt. With that practice too.   Message sent Rocco Serene for Dr. Parks Ranger.

## 2022-10-31 NOTE — Telephone Encounter (Signed)
Reason for Disposition  Sounds like a serious injury to the triager  Answer Assessment - Initial Assessment Questions 1. MECHANISM: "How did the fall happen?"     Fell on Saturday and having pain on right collarbone.    Getting up from bed early in the morning to go to the restroom.   I fell at the toilet.   I don't know how I fell but I did.   I was reaching for the arm of the toilet and didn't get it and fell. Did not hit her head.    My right knee is sore and bruised.    I can walk and can bend my knee.    My right collarbone is hurting.   It hurts to move my right arm.   I have a lump on my collarbone in towards my neck and my neck is sore.     I take BC powders 1-2 a day.      2. DOMESTIC VIOLENCE AND ELDER ABUSE SCREENING: "Did you fall because someone pushed you or tried to hurt you?" If Yes, ask: "Are you safe now?"     No 3. ONSET: "When did the fall happen?" (e.g., minutes, hours, or days ago)     Reaching for the toilet arm and didn't make it.    4. LOCATION: "What part of the body hit the ground?" (e.g., back, buttocks, head, hips, knees, hands, head, stomach)     Yes my collarbone has a lump on it and it hurts. 5. INJURY: "Did you hurt (injure) yourself when you fell?" If Yes, ask: "What did you injure? Tell me more about this?" (e.g., body area; type of injury; pain severity)"     Yes 6. PAIN: "Is there any pain?" If Yes, ask: "How bad is the pain?" (e.g., Scale 1-10; or mild,  moderate, severe)   - NONE (0): No pain   - MILD (1-3): Doesn't interfere with normal activities    - MODERATE (4-7): Interferes with normal activities or awakens from sleep    - SEVERE (8-10): Excruciating pain, unable to do any normal activities      Moderate  7. SIZE: For cuts, bruises, or swelling, ask: "How large is it?" (e.g., inches or centimeters)      Bruised right knee and sore, painful collarbone with a lump on it towardds my neck. 8. PREGNANCY: "Is there any chance you are pregnant?" "When  was your last menstrual period?"     N/A 9. OTHER SYMPTOMS: "Do you have any other symptoms?" (e.g., dizziness, fever, weakness; new onset or worsening).      No 10. CAUSE: "What do you think caused the fall (or falling)?" (e.g., tripped, dizzy spell)       I didn't reach the toilet arm in time.  Protocols used: Falls and [facility]

## 2022-10-31 NOTE — Telephone Encounter (Signed)
Pt's husband called asking for a refill on the hydrocodone because she fell and hurt her shoulder .  She will be seeing [facility] today but will not ask for pain medication.  [facility]  CB#  [phone]

## 2022-11-01 ENCOUNTER — Other Ambulatory Visit: Payer: Self-pay | Admitting: Family Medicine

## 2022-11-01 DIAGNOSIS — G894 Chronic pain syndrome: Secondary | ICD-10-CM

## 2022-11-01 DIAGNOSIS — M170 Bilateral primary osteoarthritis of knee: Secondary | ICD-10-CM

## 2022-11-01 NOTE — Telephone Encounter (Signed)
Copied from [place] [id number]. Topic: General - Other >> Nov 01, 2022 11:44 AM Everette C wrote: Reason for CRM: Medication Refill - Medication: HYDROcodone-acetaminophen (NORCO/VICODIN) 5-325 MG tablet [[id number]]   Has the patient contacted their pharmacy? Yes.   The patient has had a fall and been directed to contact their PCP  (Agent: If no, request that the patient contact the pharmacy for the refill. If patient does not wish to contact the pharmacy document the reason why and proceed with request.) (Agent: If yes, when and what did the pharmacy advise?)  Preferred Pharmacy (with phone number or street name): [id number] [place] [id number]: [phone]: [phone]: Not open 24 hours   Has the patient been seen for an appointment in the last year OR does the patient have an upcoming appointment? Yes.    Agent: Please be advised that RX refills may take up to 3 business days. We ask that you follow-up with your pharmacy.

## 2022-11-01 NOTE — Telephone Encounter (Signed)
It looks like last time Oxycodone was ordered for her was 10/27/22 by [facility].  I cannot re order the pain medication at this time. Since it is too soon. It has only been 5 days.  Also, If she is receiving Oxycodone from Orthopedics, they can manage further refills for this problem.  [name], DO [facility] 11/01/2022, 9:12 AM

## 2022-11-02 NOTE — Telephone Encounter (Signed)
I called and spoke with the patient's husband and informed him of Dr. Marthann Schiller recommendation. He informed me that the Oxycodone prescription was written for her to take after her surgery. The prescription was written in advance for her to use post-surgery for her knee. She was seen at [facility] yesterday for her shoulder pain. The husband told them that you were managing her pain medication. I informed him since they are managing the shoulder issue it is best that they manage the pain medication. He verbalizes understanding.

## 2022-11-03 MED ORDER — HYDROCODONE-ACETAMINOPHEN 5-325 MG PO TABS: 1.0000 | ORAL_TABLET | Freq: Four times a day (QID) | ORAL | 0 refills | Status: DC | PRN

## 2022-11-03 NOTE — Telephone Encounter (Signed)
Husband given message about pain medication. He states she has been taking it every 6 hours and needs refill. States orthopedics will not refill. Please advise pt.

## 2022-11-03 NOTE — Telephone Encounter (Signed)
Requested medication (s) are due for refill today: Yes  Requested medication (s) are on the active medication list: Yes  Last refill:  10/13/22  Future visit scheduled: No  Notes to clinic:  See request.    Requested Prescriptions  Pending Prescriptions Disp Refills   HYDROcodone-acetaminophen (NORCO/VICODIN) 5-325 MG tablet 60 tablet 0    Sig: Take 1 tablet by mouth every 6 (six) hours as needed for moderate pain.     Not Delegated - Analgesics:  Opioid Agonist Combinations Failed - 11/01/2022  1:10 PM      Failed - This refill cannot be delegated      Failed - Urine Drug Screen completed in last 360 days      Passed - Valid encounter within last 3 months    Recent Outpatient Visits           2 weeks ago Pre-op evaluation   [place], DO   3 months ago Primary osteoarthritis of both knees   [place], DO   4 months ago Hip pain, acute, right   [facility] [place], Coralie Keens, NP   5 months ago Primary osteoarthritis of both knees   [place], DO   9 months ago Left arm pain   [place], Devonne Doughty, [place]

## 2022-11-08 DIAGNOSIS — M25511 Pain in right shoulder: Secondary | ICD-10-CM | POA: Diagnosis not present

## 2022-11-09 DIAGNOSIS — F32A Depression, unspecified: Secondary | ICD-10-CM | POA: Diagnosis not present

## 2022-11-09 DIAGNOSIS — E785 Hyperlipidemia, unspecified: Secondary | ICD-10-CM | POA: Diagnosis not present

## 2022-11-09 DIAGNOSIS — I10 Essential (primary) hypertension: Secondary | ICD-10-CM | POA: Diagnosis not present

## 2022-11-09 DIAGNOSIS — J439 Emphysema, unspecified: Secondary | ICD-10-CM | POA: Diagnosis not present

## 2022-11-09 DIAGNOSIS — M1712 Unilateral primary osteoarthritis, left knee: Secondary | ICD-10-CM | POA: Diagnosis not present

## 2022-11-09 DIAGNOSIS — K219 Gastro-esophageal reflux disease without esophagitis: Secondary | ICD-10-CM | POA: Diagnosis not present

## 2022-11-09 DIAGNOSIS — K449 Diaphragmatic hernia without obstruction or gangrene: Secondary | ICD-10-CM | POA: Diagnosis not present

## 2022-11-15 DIAGNOSIS — R0602 Shortness of breath: Secondary | ICD-10-CM | POA: Diagnosis not present

## 2022-11-15 DIAGNOSIS — J449 Chronic obstructive pulmonary disease, unspecified: Secondary | ICD-10-CM | POA: Diagnosis not present

## 2022-11-15 DIAGNOSIS — Z01818 Encounter for other preprocedural examination: Secondary | ICD-10-CM | POA: Diagnosis not present

## 2022-11-22 ENCOUNTER — Other Ambulatory Visit: Payer: Self-pay | Admitting: Family Medicine

## 2022-11-22 DIAGNOSIS — G894 Chronic pain syndrome: Secondary | ICD-10-CM

## 2022-11-22 DIAGNOSIS — M170 Bilateral primary osteoarthritis of knee: Secondary | ICD-10-CM

## 2022-11-22 MED ORDER — HYDROCODONE-ACETAMINOPHEN 5-325 MG PO TABS: 1.0000 | ORAL_TABLET | Freq: Four times a day (QID) | ORAL | 0 refills | Status: DC | PRN

## 2022-11-22 NOTE — Telephone Encounter (Signed)
Medication Refill - Medication: HYDROcodone-acetaminophen (NORCO/VICODIN) 5-325 MG tablet   Has the patient contacted their pharmacy? No.  Preferred Pharmacy (with phone number or street name):  [id number] [place] [address] Phone: [phone] Fax: [phone]    Has the patient been seen for an appointment in the last year OR does the patient have an upcoming appointment? Yes.    Agent: Please be advised that RX refills may take up to 3 business days. We ask that you follow-up with your pharmacy.

## 2022-11-22 NOTE — Telephone Encounter (Signed)
Requested medication (s) are due for refill today - yes  Requested medication (s) are on the active medication list -yes  Future visit scheduled -no  Last refill: 11/03/22 #60  Notes to clinic: non delegated Rx  Requested Prescriptions  Pending Prescriptions Disp Refills   HYDROcodone-acetaminophen (NORCO/VICODIN) 5-325 MG tablet 60 tablet 0    Sig: Take 1 tablet by mouth every 6 (six) hours as needed for moderate pain.     Not Delegated - Analgesics:  Opioid Agonist Combinations Failed - 11/22/2022  2:05 PM      Failed - This refill cannot be delegated      Failed - Urine Drug Screen completed in last 360 days      Passed - Valid encounter within last 3 months    Recent Outpatient Visits           1 month ago Pre-op evaluation   [place], DO   3 months ago Primary osteoarthritis of both knees   [place], DO   5 months ago Hip pain, acute, right   [facility] [place], Coralie Keens, NP   6 months ago Primary osteoarthritis of both knees   [facility] Olin Hauser, DO   10 months ago Left arm pain   [facility] [place], Devonne Doughty, DO                 Requested Prescriptions  Pending Prescriptions Disp Refills   HYDROcodone-acetaminophen (NORCO/VICODIN) 5-325 MG tablet 60 tablet 0    Sig: Take 1 tablet by mouth every 6 (six) hours as needed for moderate pain.     Not Delegated - Analgesics:  Opioid Agonist Combinations Failed - 11/22/2022  2:05 PM      Failed - This refill cannot be delegated      Failed - Urine Drug Screen completed in last 360 days      Passed - Valid encounter within last 3 months    Recent Outpatient Visits           1 month ago Pre-op evaluation   [place], DO   3 months ago Primary osteoarthritis of both knees   [place], DO   5 months ago Hip pain, acute, right   [facility] [place], Coralie Keens, NP   6 months ago Primary osteoarthritis of both knees   [place], DO   10 months ago Left arm pain   [place], Devonne Doughty, [place]

## 2022-11-23 DIAGNOSIS — R0602 Shortness of breath: Secondary | ICD-10-CM | POA: Diagnosis not present

## 2022-11-23 DIAGNOSIS — Z01818 Encounter for other preprocedural examination: Secondary | ICD-10-CM | POA: Diagnosis not present

## 2022-11-28 ENCOUNTER — Ambulatory Visit
Admission: RE | Admit: 2022-11-28 | Discharge: 2022-11-28 | Disposition: A | Discharge status: Home / Self Care | Payer: Medicare Other | Attending: Family Medicine | Admitting: Family Medicine

## 2022-11-28 ENCOUNTER — Ambulatory Visit
Admission: RE | Admit: 2022-11-28 | Discharge: 2022-11-28 | Disposition: A | Discharge status: Home / Self Care | Payer: Medicare Other | Source: Ambulatory Visit | Attending: Family Medicine | Admitting: Family Medicine

## 2022-11-28 ENCOUNTER — Ambulatory Visit: Payer: Medicare Other | Admitting: Family Medicine

## 2022-11-28 ENCOUNTER — Ambulatory Visit: Payer: Self-pay

## 2022-11-28 DIAGNOSIS — M25552 Pain in left hip: Secondary | ICD-10-CM | POA: Diagnosis not present

## 2022-11-28 DIAGNOSIS — W19XXXA Unspecified fall, initial encounter: Secondary | ICD-10-CM

## 2022-11-28 NOTE — Progress Notes (Signed)
Patient was triaged today for Patient presents for a same day appointment. And sent to office for X-ray acutely, and CMA staff discussed with patient and husband that she should be seen at Orthopedics office, Sampson Si was contacted. STAT X-ray L Hip was ordered to expedite process, reviewed result. No fracture or dislocation. 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 They were scheduled to see Ortho on Monday. Return precautions given. No full medical evaluation was done today, since they requested / intended to have X-ray only when called and were advised Orthopedics.  [name], [facility] 11/28/2022, 1:05 PM

## 2022-11-28 NOTE — Telephone Encounter (Signed)
  Chief Complaint: left hip pain around waist and down left leg Symptoms: severe pain Frequency: yesterday tough the pt fell 4 days ago Pertinent Negatives: Patient denies fever rash Disposition: '[]'$ ED /'[]'$ Urgent Care (no appt availability in office) / '[x]'$ Appointment(In office/virtual)/ '[]'$  [place] [facility]/ '[]'$ Home Care/ '[]'$ Refused Recommended Disposition /'[]'$ [facility]/ '[]'$  Follow-up with PCP Additional Notes: pt given at 0920 appt to see PCP. Pt took some old pain medication without effect.  Reason for Disposition  [1] SEVERE pain (e.g., excruciating, unable to do any normal activities) AND [2] not improved after 2 hours of pain medicine  Answer Assessment - Initial Assessment Questions 1. LOCATION and RADIATION: "Where is the pain located?"      Left hip around wait and down left leg 2. QUALITY: "What does the pain feel like?"  (e.g., sharp, dull, aching, burning)     Sharp  3. SEVERITY: "How bad is the pain?" "What does it keep you from doing?"   (Scale 1-10; or mild, moderate, severe)   -  MILD (1-3): doesn't interfere with normal activities    -  MODERATE (4-7): interferes with normal activities (e.g., work or school) or awakens from sleep, limping    -  SEVERE (8-10): excruciating pain, unable to do any normal activities, unable to walk     severe 4. ONSET: "When did the pain start?" "Does it come and go, or is it there all the time?"     yesterday 5. WORK OR EXERCISE: "Has there been any recent work or exercise that involved this part of the body?"      Fall 4 weeks ago  6. CAUSE: "What do you think is causing the hip pain?"      fall 7. AGGRAVATING FACTORS: "What makes the hip pain worse?" (e.g., walking, climbing stairs, running)     Sit up lay down doesn't hurt 8. OTHER SYMPTOMS: "Do you have any other symptoms?" (e.g., back pain, pain shooting down leg,  fever, rash)     Pain shooting down  Protocols used: Hip Pain-A-AH

## 2022-12-04 ENCOUNTER — Encounter: Payer: Self-pay | Admitting: Family Medicine

## 2022-12-04 ENCOUNTER — Ambulatory Visit (INDEPENDENT_AMBULATORY_CARE_PROVIDER_SITE_OTHER): Payer: Medicare Other | Admitting: Family Medicine

## 2022-12-04 VITALS — BP 128/62 | HR 84 | Ht <= 58 in | Wt 103.5 lb

## 2022-12-04 DIAGNOSIS — N3000 Acute cystitis without hematuria: Secondary | ICD-10-CM | POA: Diagnosis not present

## 2022-12-04 DIAGNOSIS — B379 Candidiasis, unspecified: Secondary | ICD-10-CM | POA: Diagnosis not present

## 2022-12-04 MED ORDER — FLUCONAZOLE 150 MG PO TABS: ORAL_TABLET | ORAL | 0 refills | Status: DC

## 2022-12-04 MED ORDER — CIPROFLOXACIN HCL 500 MG PO TABS: 500.0000 mg | ORAL_TABLET | Freq: Two times a day (BID) | ORAL | 0 refills | Status: AC

## 2022-12-04 NOTE — Progress Notes (Signed)
Subjective:    Patient ID: Pamela Ball, female    DOB: 1946/09/19, 77 y.o.   MRN: [id number]  Pamela Ball is a 77 y.o. female presenting on 12/04/2022 for possible kidney infection and Back Pain  Patient presents for a same day appointment.  HPI  UTI Flank Pain Dysuria Reports new onset symptoms within past few days, not improving. Here for evaluation. Difficulty voiding now with dysuria and has urge to go but less urine. Similar to prior UTI. Admits L flank pain. Had fall injury no fracture, had x-rays last week, they cancelled apt w ortho since improved. Denies hematuria fever nausea vomiting       10/19/2022   10:39 AM 05/25/2022    9:05 AM 02/16/2022   11:08 AM  Depression screen PHQ 2/9  Decreased Interest 0 0 0  Down, Depressed, Hopeless 0 0 0  PHQ - 2 Score 0 0 0  Altered sleeping '3 3 3  '$ Tired, decreased energy 0 0 0  Change in appetite '3 3 3  '$ Feeling bad or failure about yourself  0 0 0  Trouble concentrating 0 0 0  Moving slowly or fidgety/restless 0 0 0  Suicidal thoughts 0 0 0  PHQ-9 Score '6 6 6  '$ Difficult doing work/chores Not difficult at all Not difficult at all Not difficult at all    Social History   Tobacco Use   Smoking status: Never   Smokeless tobacco: Never  Vaping Use   Vaping Use: Never used  Substance Use Topics   Alcohol use: No    Comment: drink occasionally    Drug use: No    Review of Systems Per HPI unless specifically indicated above     Objective:    BP 128/62   Pulse 84   Ht '4\' 10"'$  (1.473 m)   Wt 103 lb 8 oz (46.9 kg)   SpO2 91%   BMI 21.63 kg/m   Wt Readings from Last 3 Encounters:  12/04/22 103 lb 8 oz (46.9 kg)  10/19/22 95 lb (43.1 kg)  09/06/22 95 lb (43.1 kg)    Physical Exam Vitals and nursing note reviewed.  Constitutional:      General: She is not in acute distress.    Appearance: Normal appearance. She is well-developed. She is not diaphoretic.     Comments: Well-appearing, comfortable,  cooperative  HENT:     Head: Normocephalic and atraumatic.  Eyes:     General:        Right eye: No discharge.        Left eye: No discharge.     Conjunctiva/sclera: Conjunctivae normal.  Cardiovascular:     Rate and Rhythm: Normal rate.  Pulmonary:     Effort: Pulmonary effort is normal.  Musculoskeletal:     Comments: In wheelchair  Skin:    General: Skin is warm and dry.     Findings: No erythema or rash.  Neurological:     Mental Status: She is alert and oriented to person, place, and time.  Psychiatric:        Mood and Affect: Mood normal.        Behavior: Behavior normal.        Thought Content: Thought content normal.     Comments: Well groomed, good eye contact, normal speech and thoughts      Results for orders placed or performed in visit on 10/19/22  TEST AUTHORIZATION  Result Value Ref Range   TEST NAME: A1C  TEST CODE: 78,295    CLIENT CONTACT: EMILY    REPORT ALWAYS MESSAGE SIGNATURE    COMPLETE METABOLIC PANEL WITH GFR  Result Value Ref Range   Glucose, Bld 118 (H) 65 - 99 mg/dL   BUN 12 7 - 25 mg/dL   Creat 0.56 (L) 0.60 - 1.00 mg/dL   eGFR 94 > OR = 60 mL/min/1.43m   BUN/Creatinine Ratio 21 6 - 22 (calc)   Sodium 140 135 - 146 mmol/L   Potassium 4.6 3.5 - 5.3 mmol/L   Chloride 99 98 - 110 mmol/L   CO2 33 (H) 20 - 32 mmol/L   Calcium 9.6 8.6 - 10.4 mg/dL   Total Protein 6.7 6.1 - 8.1 g/dL   Albumin 4.3 3.6 - 5.1 g/dL   Globulin 2.4 1.9 - 3.7 g/dL (calc)   AG Ratio 1.8 1.0 - 2.5 (calc)   Total Bilirubin 0.4 0.2 - 1.2 mg/dL   Alkaline phosphatase (APISO) 93 37 - 153 U/L   AST 19 10 - 35 U/L   ALT 13 6 - 29 U/L  Hemoglobin A1c  Result Value Ref Range   Hgb A1c MFr Bld 5.5 <5.7 % of total Hgb   Mean Plasma Glucose 111 mg/dL   eAG (mmol/L) 6.2 mmol/L  CBC with Differential/Platelet  Result Value Ref Range   WBC 5.9 3.8 - 10.8 Thousand/uL   RBC 4.52 3.80 - 5.10 Million/uL   Hemoglobin 14.9 11.7 - 15.5 g/dL   HCT 43.6 35.0 - 45.0 %   MCV  96.5 80.0 - 100.0 fL   MCH 33.0 27.0 - 33.0 pg   MCHC 34.2 32.0 - 36.0 g/dL   RDW 11.9 11.0 - 15.0 %   Platelets 235 140 - 400 Thousand/uL   MPV 10.3 7.5 - 12.5 fL   Neutro Abs 3,918 1,500 - 7,800 cells/uL   Lymphs Abs 1,333 850 - 3,900 cells/uL   Absolute Monocytes 566 200 - 950 cells/uL   Eosinophils Absolute 53 15 - 500 cells/uL   Basophils Absolute 30 0 - 200 cells/uL   Neutrophils Relative % 66.4 %   Total Lymphocyte 22.6 %   Monocytes Relative 9.6 %   Eosinophils Relative 0.9 %   Basophils Relative 0.5 %  TEST AUTHORIZATION  Result Value Ref Range   TEST NAME: A1C    TEST CODE: 162,130   CLIENT CONTACT: EMILY    REPORT ALWAYS MESSAGE SIGNATURE        Assessment & Plan:   Problem List Items Addressed This Visit   None Visit Diagnoses     Acute cystitis without hematuria    -  Primary   Relevant Medications   ciprofloxacin (CIPRO) 500 MG tablet   Other Relevant Orders   Urinalysis, Routine w reflex microscopic   Urine Culture   Antibiotic-induced yeast infection       Relevant Medications   fluconazole (DIFLUCAN) 150 MG tablet       Clinically consistent with UTI with flank pain and dysuria History of recurrent UTI Cannot rule out Pyelo however no nausea vomiting, appears comfortable, afebrile. Seems less consistent. No hematuria, less likely nephrolithiasis.  Plan: 1. Order Urinalysis + Urine Culture, she was unable to avoid initially 2. Cipro 500 TWICE A DAY x 7 days, empiric UTI vs kidney infection, caution MSK risk side effect tendon 3. Anti yeast medication 4. Improve PO hydration Return if worsening or new concerns, criteria when to seek care at hospital or ED    Orders Placed  This Encounter  Procedures   Urine Culture   Urinalysis, Routine w reflex microscopic    Meds ordered this encounter  Medications   ciprofloxacin (CIPRO) 500 MG tablet    Sig: Take 1 tablet (500 mg total) by mouth 2 (two) times daily for 7 days.    Dispense:  14 tablet     Refill:  0   fluconazole (DIFLUCAN) 150 MG tablet    Sig: Take one tablet by mouth on Day 1. Repeat dose 2nd tablet on Day 3.    Dispense:  2 tablet    Refill:  0     Follow up plan: Return if symptoms worsen or fail to improve.    [name], [facility] 12/04/2022, 3:46 PM

## 2022-12-04 NOTE — Patient Instructions (Addendum)
Thank you for coming to the office today.  1. You have a Urinary Tract Infection - this is very common, your symptoms are reassuring and you should get better within 1 week on the antibiotics  - Start Cipro '500mg'$  2 times daily for next 7 days, complete entire course, even if feeling better Take Diflucan AFTER for yeast infection  - We sent urine for a culture, we will call you within next few days if we need to change antibiotics - Please drink plenty of fluids, improve hydration over next 1 week  If symptoms worsening, developing nausea / vomiting, worsening back pain, fevers / chills / sweats, then please return for re-evaluation sooner or go to hospital ED for Imaging and IV antibiotics if worsening cannot keep medication down.  If you take AZO OTC - limit this to 2-3 days MAX to avoid affecting kidneys  D-Mannose is a natural supplement that can actually help bind to urinary bacteria and reduce their effectiveness it can help prevent UTI from forming, and may reduce some symptoms. It likely cannot cure an active UTI but it is worth a try and good to prevent them with. Try '500mg'$  twice a day at a full dose if you want, or check package instructions for more info    Please schedule a Follow-up Appointment to: Return if symptoms worsen or fail to improve.  If you have any other questions or concerns, please feel free to call the office or send a message through [place]. You may also schedule an earlier appointment if necessary.  Additionally, you may be receiving a survey about your experience at our office within a few days to 1 week by e-mail or mail. We value your feedback.  [name], DO [facility]

## 2022-12-05 DIAGNOSIS — N3000 Acute cystitis without hematuria: Secondary | ICD-10-CM | POA: Diagnosis not present

## 2022-12-06 LAB — URINE CULTURE
MICRO NUMBER:: 14493591
Result:: NO GROWTH
SPECIMEN QUALITY:: ADEQUATE

## 2022-12-06 LAB — URINALYSIS, ROUTINE W REFLEX MICROSCOPIC
Bilirubin Urine: NEGATIVE
Glucose, UA: NEGATIVE
Hgb urine dipstick: NEGATIVE
Ketones, ur: NEGATIVE
Leukocytes,Ua: NEGATIVE
Nitrite: NEGATIVE
Protein, ur: NEGATIVE
Specific Gravity, Urine: 1.012 (ref 1.001–1.035)
pH: 6 (ref 5.0–8.0)

## 2022-12-07 ENCOUNTER — Other Ambulatory Visit: Payer: Self-pay | Admitting: Family Medicine

## 2022-12-07 DIAGNOSIS — M170 Bilateral primary osteoarthritis of knee: Secondary | ICD-10-CM

## 2022-12-07 DIAGNOSIS — G894 Chronic pain syndrome: Secondary | ICD-10-CM

## 2022-12-07 MED ORDER — HYDROCODONE-ACETAMINOPHEN 5-325 MG PO TABS: 1.0000 | ORAL_TABLET | Freq: Four times a day (QID) | ORAL | 0 refills | Status: DC | PRN

## 2022-12-07 NOTE — Telephone Encounter (Signed)
Requested medication (s) are due for refill today - yes  Requested medication (s) are on the active medication list -yes  Future visit scheduled -no  Last refill: 11/22/22 #60  Notes to clinic: non delegated Rx  Requested Prescriptions  Pending Prescriptions Disp Refills   HYDROcodone-acetaminophen (NORCO/VICODIN) 5-325 MG tablet 60 tablet 0    Sig: Take 1 tablet by mouth every 6 (six) hours as needed for moderate pain.     Not Delegated - Analgesics:  Opioid Agonist Combinations Failed - 12/07/2022 11:15 AM      Failed - This refill cannot be delegated      Failed - Urine Drug Screen completed in last 360 days      Passed - Valid encounter within last 3 months    Recent Outpatient Visits           3 days ago Acute cystitis without hematuria   [place], DO   1 week ago Left hip pain   [place], DO   1 month ago Pre-op evaluation   [facility] Olin Hauser, DO   4 months ago Primary osteoarthritis of both [facility] Olin Hauser, DO   5 months ago Hip pain, acute, right   [facility] [place], Coralie Keens, NP                 Requested Prescriptions  Pending Prescriptions Disp Refills   HYDROcodone-acetaminophen (NORCO/VICODIN) 5-325 MG tablet 60 tablet 0    Sig: Take 1 tablet by mouth every 6 (six) hours as needed for moderate pain.     Not Delegated - Analgesics:  Opioid Agonist Combinations Failed - 12/07/2022 11:15 AM      Failed - This refill cannot be delegated      Failed - Urine Drug Screen completed in last 360 days      Passed - Valid encounter within last 3 months    Recent Outpatient Visits           3 days ago Acute cystitis without hematuria   [place], DO   1 week ago Left  hip pain   [place], DO   1 month ago Pre-op evaluation   [place], DO   4 months ago Primary osteoarthritis of both [place], DO   5 months ago Hip pain, acute, right   [facility] [place], Coralie Keens, [place]

## 2022-12-07 NOTE — Telephone Encounter (Signed)
Medication Refill - Medication: HYDROcodone-acetaminophen (NORCO/VICODIN) 5-325 MG tablet   Has the patient contacted their pharmacy? No because of narcotic (Agent: If no, request that the patient contact the pharmacy for the refill. If patient does not wish to contact the pharmacy document the reason why and proceed with request.) (Agent: If yes, when and what did the pharmacy advise?)pt called directly in  Preferred Pharmacy (with phone number or street name):  [id number] [address] [address] Phone: [phone] Fax: [phone]    Has the patient been seen for an appointment in the last year OR does the patient have an upcoming appointment? yes  Agent: Please be advised that RX refills may take up to 3 business days. We ask that you follow-up with your pharmacy.

## 2022-12-11 DIAGNOSIS — R0602 Shortness of breath: Secondary | ICD-10-CM | POA: Diagnosis not present

## 2022-12-11 DIAGNOSIS — Z01818 Encounter for other preprocedural examination: Secondary | ICD-10-CM | POA: Diagnosis not present

## 2022-12-25 ENCOUNTER — Other Ambulatory Visit: Payer: Self-pay | Admitting: Family Medicine

## 2022-12-25 DIAGNOSIS — I10 Essential (primary) hypertension: Secondary | ICD-10-CM

## 2022-12-25 DIAGNOSIS — G894 Chronic pain syndrome: Secondary | ICD-10-CM

## 2022-12-25 DIAGNOSIS — E782 Mixed hyperlipidemia: Secondary | ICD-10-CM

## 2022-12-25 DIAGNOSIS — M170 Bilateral primary osteoarthritis of knee: Secondary | ICD-10-CM

## 2022-12-25 NOTE — Telephone Encounter (Signed)
Medication Refill - Medication: Hydrocodone 5/325  Has the patient contacted their pharmacy? No. (Agent: If no, request that the patient contact the pharmacy for the refill. If patient does not wish to contact the pharmacy document the reason why and proceed with request.) (Agent: If yes, when and what did the pharmacy advise?)  Preferred Pharmacy (with phone number or street name): [facility] Has the patient been seen for an appointment in the last year OR does the patient have an upcoming appointment? Yes.    Agent: Please be advised that RX refills may take up to 3 business days. We ask that you follow-up with your pharmacy.

## 2022-12-26 MED ORDER — HYDROCODONE-ACETAMINOPHEN 5-325 MG PO TABS: 1.0000 | ORAL_TABLET | Freq: Four times a day (QID) | ORAL | 0 refills | Status: DC | PRN

## 2022-12-26 NOTE — Telephone Encounter (Signed)
[name] [name] is calling in regarding an update on when the patient's prescription for Hydrocodone will be ready. Please follow up with patient's husband.

## 2022-12-26 NOTE — Telephone Encounter (Signed)
Requested Prescriptions  Pending Prescriptions Disp Refills   simvastatin (ZOCOR) 20 MG tablet [Pharmacy Med Name: SIMVASTATIN 20 MG TABLET] 90 tablet 0    Sig: TAKE 1 TABLET BY MOUTH EVERY DAY     Cardiovascular:  Antilipid - Statins Failed - 12/25/2022 12:03 PM      Failed - Lipid Panel in normal range within the last 12 months    Cholesterol  Date Value Ref Range Status  06/28/2020 215 (H) <200 mg/dL Final   LDL Cholesterol (Calc)  Date Value Ref Range Status  06/28/2020 114 (H) mg/dL (calc) Final    Comment:    Reference range: <100 . Desirable range <100 mg/dL for primary prevention;   <70 mg/dL for patients with CHD or diabetic patients  with > or = 2 CHD risk factors. Marland Kitchen LDL-C is now calculated using the Martin-Hopkins  calculation, which is a validated novel method providing  better accuracy than the Friedewald equation in the  estimation of LDL-C.  Cresenciano Genre et al. Annamaria Helling. [id number]): 2061-2068  (http://education.[facility].com/faq/FAQ164)    HDL  Date Value Ref Range Status  06/28/2020 77 > OR = 50 mg/dL Final   Triglycerides  Date Value Ref Range Status  06/28/2020 125 <150 mg/dL Final         Passed - Patient is not pregnant      Passed - Valid encounter within last 12 months    Recent Outpatient Visits           3 weeks ago Acute cystitis without hematuria   [facility] [place], Devonne Doughty, DO   4 weeks ago Left hip pain   [facility] Olin Hauser, DO   2 months ago Pre-op evaluation   [facility] [place], Devonne Doughty, DO   4 months ago Primary osteoarthritis of both [facility] Olin Hauser, DO   6 months ago Hip pain, acute, right   [facility] [place], [place], NP               amLODipine ([place]) 10 MG tablet [Pharmacy Med Name: AMLODIPINE BESYLATE 10 MG  TAB] 90 tablet 0    Sig: TAKE 1 TABLET BY MOUTH EVERY DAY     Cardiovascular: Calcium Channel Blockers 2 Passed - 12/25/2022 12:03 PM      Passed - Last BP in normal range    BP Readings from Last 1 Encounters:  12/04/22 128/62         Passed - Last Heart Rate in normal range    Pulse Readings from Last 1 Encounters:  12/04/22 84         Passed - Valid encounter within last 6 months    Recent Outpatient Visits           3 weeks ago Acute cystitis without hematuria   [place], DO   4 weeks ago Left hip pain   [place], DO   2 months ago Pre-op evaluation   [employer], DO   4 months ago Primary osteoarthritis of both [place], DO   6 months ago Hip pain, acute, right   [facility] [place], Coralie Keens, [place]

## 2022-12-26 NOTE — Telephone Encounter (Signed)
Requested medication (s) are due for refill today: yes  Requested medication (s) are on the active medication list: yes  Last refill:  12/07/22  Future visit scheduled: yes  Notes to clinic:  Unable to refill per protocol, cannot delegate.      Requested Prescriptions  Pending Prescriptions Disp Refills   HYDROcodone-acetaminophen (NORCO/VICODIN) 5-325 MG tablet 60 tablet 0    Sig: Take 1 tablet by mouth every 6 (six) hours as needed for moderate pain.     Not Delegated - Analgesics:  Opioid Agonist Combinations Failed - 12/25/2022 12:29 PM      Failed - This refill cannot be delegated      Failed - Urine Drug Screen completed in last 360 days      Passed - Valid encounter within last 3 months    Recent Outpatient Visits           3 weeks ago Acute cystitis without hematuria   [place], DO   4 weeks ago Left hip pain   [place], DO   2 months ago Pre-op evaluation   [place], DO   4 months ago Primary osteoarthritis of both [place], DO   6 months ago Hip pain, acute, right   [facility] [place], Coralie Keens, [place]

## 2023-01-01 DIAGNOSIS — M25511 Pain in right shoulder: Secondary | ICD-10-CM | POA: Diagnosis not present

## 2023-01-08 ENCOUNTER — Ambulatory Visit: Payer: Self-pay

## 2023-01-08 ENCOUNTER — Ambulatory Visit: Payer: Self-pay | Admitting: *Deleted

## 2023-01-08 DIAGNOSIS — B379 Candidiasis, unspecified: Secondary | ICD-10-CM

## 2023-01-08 NOTE — Telephone Encounter (Signed)
Summary: yeast infection?   Pt husband is calling in stating that PCP prescribed pt an antibiotic and now she has a yeast infection. He is wanting some medication called in for her yeast infection. Please advise.      Called pt - left message on machine to call back.

## 2023-01-08 NOTE — Telephone Encounter (Signed)
Summary: yeast infection?   Pt husband is calling in stating that PCP prescribed pt an antibiotic and now she has a yeast infection. He is wanting some medication called in for her yeast infection. Please advise.     Called pt - left message on machine to call back.

## 2023-01-08 NOTE — Telephone Encounter (Signed)
  Chief Complaint: Vaginal Itching Symptoms: Itching and burning Frequency: Already took 2 diflucan as ordered 12/04/22. States never went away. Pertinent Negatives: Patient denies discharge Disposition: '[]'$ ED /'[]'$ Urgent Care (no appt availability in office) / '[]'$ Appointment(In office/virtual)/ '[]'$  [place] [facility]/ '[]'$ Home Care/ '[]'$ Refused Recommended Disposition /'[]'$ [place] [facility]/ '[]'$  Follow-up with PCP Additional Notes:  Stats hard for her to come in for appt. Please advise.  Reason for Disposition  [1] Symptoms of a yeast infection (i.e., itchy, white discharge, not bad smelling) AND [2] not improved > 3 days following Care Advice  Answer Assessment - Initial Assessment Questions 1. SYMPTOM: "What's the main symptom you're concerned about?" (e.g., pain, itching, dryness)     Itching and burning 2. LOCATION: "Where is the  *No Answer* located?" (e.g., inside/outside, left/right)     Vaginal 3. ONSET: "When did the    start?"     Last Weekend 4. PAIN: "Is there any pain?" If Yes, ask: "How bad is it?" (Scale: 1-10; mild, moderate, severe)   -  MILD (1-3): Doesn't interfere with normal activities.    -  MODERATE (4-7): Interferes with normal activities (e.g., work or school) or awakens from sleep.     -  SEVERE (8-10): Excruciating pain, unable to do any normal activities.     No 5. ITCHING: "Is there any itching?" If Yes, ask: "How bad is it?" (Scale: 1-10; mild, moderate, severe)     Moderate 6. CAUSE: "What do you think is causing the discharge?" "Have you had the same problem before? What happened then?"     ATBs, not on presently 7. OTHER SYMPTOMS: "Do you have any other symptoms?" (e.g., fever, itching, vaginal bleeding, pain with urination, injury to genital area, vaginal foreign body)     Itching and burning Took meds for yeast last week  Protocols used: Vaginal Symptoms-A-AH

## 2023-01-08 NOTE — Telephone Encounter (Signed)
Summary: yeast infection?   Pt husband is calling in stating that PCP prescribed pt an antibiotic and now she has a yeast infection. He is wanting some medication called in for her yeast infection. Please advise.      Called pt - left message to call back.

## 2023-01-09 MED ORDER — FLUCONAZOLE 150 MG PO TABS: 150.0000 mg | ORAL_TABLET | ORAL | 0 refills | Status: DC

## 2023-01-09 NOTE — Telephone Encounter (Signed)
Please notify them that I will extend her Diflucan course, I will re order a longer course for her symptoms, as it may not have been resolved on the 2 doses.  [name], [facility] 01/09/2023, 12:00 PM

## 2023-01-11 ENCOUNTER — Other Ambulatory Visit: Payer: Self-pay | Admitting: Family Medicine

## 2023-01-11 DIAGNOSIS — M170 Bilateral primary osteoarthritis of knee: Secondary | ICD-10-CM

## 2023-01-11 DIAGNOSIS — E782 Mixed hyperlipidemia: Secondary | ICD-10-CM

## 2023-01-11 DIAGNOSIS — G894 Chronic pain syndrome: Secondary | ICD-10-CM

## 2023-01-11 MED ORDER — SIMVASTATIN 20 MG PO TABS: 20.0000 mg | ORAL_TABLET | Freq: Every day | ORAL | 3 refills | Status: DC

## 2023-01-11 MED ORDER — HYDROCODONE-ACETAMINOPHEN 5-325 MG PO TABS: 1.0000 | ORAL_TABLET | Freq: Four times a day (QID) | ORAL | 0 refills | Status: DC | PRN

## 2023-01-11 NOTE — Telephone Encounter (Signed)
Requested medication (s) are due for refill today:   Provider to review  Requested medication (s) are on the active medication list:   Yes for both  Future visit scheduled:   No   Last ordered: Vicodan 12/26/2022 #60, 0 refills;   Zocor 12/26/2022 #90, 0 refills  Returned because Odelia Gage is a non delegated refill;    Labs are overdue for Zocor, lipid panel due   Requested Prescriptions  Pending Prescriptions Disp Refills   HYDROcodone-acetaminophen (NORCO/VICODIN) 5-325 MG tablet 60 tablet 0    Sig: Take 1 tablet by mouth every 6 (six) hours as needed for moderate pain.     Not Delegated - Analgesics:  Opioid Agonist Combinations Failed - 01/11/2023 10:28 AM      Failed - This refill cannot be delegated      Failed - Urine Drug Screen completed in last 360 days      Passed - Valid encounter within last 3 months    Recent Outpatient Visits           1 month ago Acute cystitis without hematuria   [place], DO   1 month ago Left hip pain   [facility] Olin Hauser, DO   2 months ago Pre-op evaluation   [facility] Olin Hauser, DO   5 months ago Primary osteoarthritis of both [place], DO   7 months ago Hip pain, acute, right   [facility] [place], Coralie Keens, NP               simvastatin (ZOCOR) 20 MG tablet 90 tablet 0    Sig: Take 1 tablet (20 mg total) by mouth daily.     Cardiovascular:  Antilipid - Statins Failed - 01/11/2023 10:28 AM      Failed - Lipid Panel in normal range within the last 12 months    Cholesterol  Date Value Ref Range Status  06/28/2020 215 (H) <200 mg/dL Final   LDL Cholesterol (Calc)  Date Value Ref Range Status  06/28/2020 114 (H) mg/dL (calc) Final    Comment:    Reference range: <100 . Desirable range <100 mg/dL for  primary prevention;   <70 mg/dL for patients with CHD or diabetic patients  with > or = 2 CHD risk factors. Marland Kitchen LDL-C is now calculated using the Martin-Hopkins  calculation, which is a validated novel method providing  better accuracy than the Friedewald equation in the  estimation of LDL-C.  Cresenciano Genre et al. Annamaria Helling. [id number]): 2061-2068  (http://education.[facility].com/faq/FAQ164)    HDL  Date Value Ref Range Status  06/28/2020 77 > OR = 50 mg/dL Final   Triglycerides  Date Value Ref Range Status  06/28/2020 125 <150 mg/dL Final         Passed - Patient is not pregnant      Passed - Valid encounter within last 12 months    Recent Outpatient Visits           1 month ago Acute cystitis without hematuria   [place], DO   1 month ago Left hip pain   [place], DO   2 months ago Pre-op evaluation   [place], DO   5  months ago Primary osteoarthritis of both [place], DO   7 months ago Hip pain, acute, right   [facility] [place], Coralie Keens, [place]

## 2023-01-11 NOTE — Telephone Encounter (Signed)
Copied from [place] [id number]. Topic: General - Other >> Jan 11, 2023  8:24 AM Everette C wrote: Reason for CRM: Medication Refill - Medication: HYDROcodone-acetaminophen (NORCO/VICODIN) 5-325 MG tablet [id number]  simvastatin (ZOCOR) 20 MG tablet [id number]  Has the patient contacted their pharmacy? Yes.   (Agent: If no, request that the patient contact the pharmacy for the refill. If patient does not wish to contact the pharmacy document the reason why and proceed with request.) (Agent: If yes, when and what did the pharmacy advise?)  Preferred Pharmacy (with phone number or street name): [id number] [place] [address] [address]: [phone]: [phone]: Not open 24 hours   Has the patient been seen for an appointment in the last year OR does the patient have an upcoming appointment? Yes.    Agent: Please be advised that RX refills may take up to 3 business days. We ask that you follow-up with your pharmacy.

## 2023-01-18 DIAGNOSIS — G8929 Other chronic pain: Secondary | ICD-10-CM | POA: Diagnosis not present

## 2023-01-18 DIAGNOSIS — D649 Anemia, unspecified: Secondary | ICD-10-CM | POA: Diagnosis not present

## 2023-01-18 DIAGNOSIS — Z7982 Long term (current) use of aspirin: Secondary | ICD-10-CM | POA: Diagnosis not present

## 2023-01-18 DIAGNOSIS — Z87891 Personal history of nicotine dependence: Secondary | ICD-10-CM | POA: Diagnosis not present

## 2023-01-18 DIAGNOSIS — E119 Type 2 diabetes mellitus without complications: Secondary | ICD-10-CM | POA: Diagnosis not present

## 2023-01-18 DIAGNOSIS — J439 Emphysema, unspecified: Secondary | ICD-10-CM | POA: Diagnosis not present

## 2023-01-18 DIAGNOSIS — J449 Chronic obstructive pulmonary disease, unspecified: Secondary | ICD-10-CM | POA: Diagnosis not present

## 2023-01-18 DIAGNOSIS — E7849 Other hyperlipidemia: Secondary | ICD-10-CM | POA: Diagnosis not present

## 2023-01-18 DIAGNOSIS — Z79891 Long term (current) use of opiate analgesic: Secondary | ICD-10-CM | POA: Diagnosis not present

## 2023-01-18 DIAGNOSIS — J9811 Atelectasis: Secondary | ICD-10-CM | POA: Diagnosis not present

## 2023-01-18 DIAGNOSIS — K5900 Constipation, unspecified: Secondary | ICD-10-CM | POA: Diagnosis not present

## 2023-01-18 DIAGNOSIS — M1712 Unilateral primary osteoarthritis, left knee: Secondary | ICD-10-CM | POA: Diagnosis not present

## 2023-01-18 DIAGNOSIS — K449 Diaphragmatic hernia without obstruction or gangrene: Secondary | ICD-10-CM | POA: Diagnosis not present

## 2023-01-18 DIAGNOSIS — M6250 Muscle wasting and atrophy, not elsewhere classified, unspecified site: Secondary | ICD-10-CM | POA: Diagnosis not present

## 2023-01-18 DIAGNOSIS — G8918 Other acute postprocedural pain: Secondary | ICD-10-CM | POA: Diagnosis not present

## 2023-01-18 DIAGNOSIS — E785 Hyperlipidemia, unspecified: Secondary | ICD-10-CM | POA: Diagnosis not present

## 2023-01-18 DIAGNOSIS — R531 Weakness: Secondary | ICD-10-CM | POA: Diagnosis not present

## 2023-01-18 DIAGNOSIS — Z9989 Dependence on other enabling machines and devices: Secondary | ICD-10-CM | POA: Diagnosis not present

## 2023-01-18 DIAGNOSIS — I10 Essential (primary) hypertension: Secondary | ICD-10-CM | POA: Diagnosis not present

## 2023-01-18 DIAGNOSIS — R0902 Hypoxemia: Secondary | ICD-10-CM | POA: Diagnosis not present

## 2023-01-18 DIAGNOSIS — D6489 Other specified anemias: Secondary | ICD-10-CM | POA: Diagnosis not present

## 2023-01-18 DIAGNOSIS — K219 Gastro-esophageal reflux disease without esophagitis: Secondary | ICD-10-CM | POA: Diagnosis not present

## 2023-01-18 DIAGNOSIS — Z96619 Presence of unspecified artificial shoulder joint: Secondary | ICD-10-CM | POA: Diagnosis not present

## 2023-01-18 DIAGNOSIS — Z9889 Other specified postprocedural states: Secondary | ICD-10-CM | POA: Diagnosis not present

## 2023-01-19 DIAGNOSIS — I10 Essential (primary) hypertension: Secondary | ICD-10-CM | POA: Diagnosis not present

## 2023-01-19 DIAGNOSIS — K219 Gastro-esophageal reflux disease without esophagitis: Secondary | ICD-10-CM | POA: Diagnosis not present

## 2023-01-22 ENCOUNTER — Telehealth: Payer: Self-pay | Admitting: Family Medicine

## 2023-01-22 NOTE — Telephone Encounter (Signed)
Contacted Pamela Ball to schedule their annual wellness visit. Call back at later date: 03/2023. Patient in [place] 01/22/2023  Pamela Ball; [facility] Direct Dial: [phone]

## 2023-01-26 DIAGNOSIS — Z7901 Long term (current) use of anticoagulants: Secondary | ICD-10-CM | POA: Diagnosis not present

## 2023-01-26 DIAGNOSIS — K449 Diaphragmatic hernia without obstruction or gangrene: Secondary | ICD-10-CM | POA: Diagnosis not present

## 2023-01-26 DIAGNOSIS — Z471 Aftercare following joint replacement surgery: Secondary | ICD-10-CM | POA: Diagnosis not present

## 2023-01-26 DIAGNOSIS — E785 Hyperlipidemia, unspecified: Secondary | ICD-10-CM | POA: Diagnosis not present

## 2023-01-26 DIAGNOSIS — K219 Gastro-esophageal reflux disease without esophagitis: Secondary | ICD-10-CM | POA: Diagnosis not present

## 2023-01-26 DIAGNOSIS — J439 Emphysema, unspecified: Secondary | ICD-10-CM | POA: Diagnosis not present

## 2023-01-26 DIAGNOSIS — Z79891 Long term (current) use of opiate analgesic: Secondary | ICD-10-CM | POA: Diagnosis not present

## 2023-01-26 DIAGNOSIS — M810 Age-related osteoporosis without current pathological fracture: Secondary | ICD-10-CM | POA: Diagnosis not present

## 2023-01-26 DIAGNOSIS — G8929 Other chronic pain: Secondary | ICD-10-CM | POA: Diagnosis not present

## 2023-01-26 DIAGNOSIS — I10 Essential (primary) hypertension: Secondary | ICD-10-CM | POA: Diagnosis not present

## 2023-01-26 DIAGNOSIS — Z96652 Presence of left artificial knee joint: Secondary | ICD-10-CM | POA: Diagnosis not present

## 2023-01-26 DIAGNOSIS — Z79899 Other long term (current) drug therapy: Secondary | ICD-10-CM | POA: Diagnosis not present

## 2023-01-26 DIAGNOSIS — Z96619 Presence of unspecified artificial shoulder joint: Secondary | ICD-10-CM | POA: Diagnosis not present

## 2023-01-29 DIAGNOSIS — J439 Emphysema, unspecified: Secondary | ICD-10-CM | POA: Diagnosis not present

## 2023-01-29 DIAGNOSIS — Z7901 Long term (current) use of anticoagulants: Secondary | ICD-10-CM | POA: Diagnosis not present

## 2023-01-29 DIAGNOSIS — I10 Essential (primary) hypertension: Secondary | ICD-10-CM | POA: Diagnosis not present

## 2023-01-29 DIAGNOSIS — M810 Age-related osteoporosis without current pathological fracture: Secondary | ICD-10-CM | POA: Diagnosis not present

## 2023-01-29 DIAGNOSIS — Z79899 Other long term (current) drug therapy: Secondary | ICD-10-CM | POA: Diagnosis not present

## 2023-01-29 DIAGNOSIS — Z96619 Presence of unspecified artificial shoulder joint: Secondary | ICD-10-CM | POA: Diagnosis not present

## 2023-01-29 DIAGNOSIS — E785 Hyperlipidemia, unspecified: Secondary | ICD-10-CM | POA: Diagnosis not present

## 2023-01-29 DIAGNOSIS — Z79891 Long term (current) use of opiate analgesic: Secondary | ICD-10-CM | POA: Diagnosis not present

## 2023-01-29 DIAGNOSIS — G8929 Other chronic pain: Secondary | ICD-10-CM | POA: Diagnosis not present

## 2023-01-29 DIAGNOSIS — K449 Diaphragmatic hernia without obstruction or gangrene: Secondary | ICD-10-CM | POA: Diagnosis not present

## 2023-01-29 DIAGNOSIS — Z471 Aftercare following joint replacement surgery: Secondary | ICD-10-CM | POA: Diagnosis not present

## 2023-01-29 DIAGNOSIS — Z96652 Presence of left artificial knee joint: Secondary | ICD-10-CM | POA: Diagnosis not present

## 2023-01-29 DIAGNOSIS — K219 Gastro-esophageal reflux disease without esophagitis: Secondary | ICD-10-CM | POA: Diagnosis not present

## 2023-01-30 ENCOUNTER — Emergency Department
Admission: EM | Admit: 2023-01-30 | Discharge: 2023-01-30 | Disposition: A | Discharge status: Home / Self Care | Payer: Medicare Other | Attending: Student in an Organized Health Care Education/Training Program | Admitting: Student in an Organized Health Care Education/Training Program

## 2023-01-30 ENCOUNTER — Other Ambulatory Visit: Payer: Self-pay

## 2023-01-30 DIAGNOSIS — Z96652 Presence of left artificial knee joint: Secondary | ICD-10-CM | POA: Insufficient documentation

## 2023-01-30 DIAGNOSIS — B029 Zoster without complications: Secondary | ICD-10-CM | POA: Insufficient documentation

## 2023-01-30 DIAGNOSIS — I10 Essential (primary) hypertension: Secondary | ICD-10-CM | POA: Insufficient documentation

## 2023-01-30 DIAGNOSIS — J449 Chronic obstructive pulmonary disease, unspecified: Secondary | ICD-10-CM | POA: Insufficient documentation

## 2023-01-30 DIAGNOSIS — G894 Chronic pain syndrome: Secondary | ICD-10-CM

## 2023-01-30 DIAGNOSIS — M170 Bilateral primary osteoarthritis of knee: Secondary | ICD-10-CM

## 2023-01-30 DIAGNOSIS — R21 Rash and other nonspecific skin eruption: Secondary | ICD-10-CM | POA: Diagnosis present

## 2023-01-30 MED ORDER — HYDROCODONE-ACETAMINOPHEN 5-325 MG PO TABS: 1.0000 | ORAL_TABLET | Freq: Four times a day (QID) | ORAL | 0 refills | Status: DC | PRN

## 2023-01-30 MED ORDER — VALACYCLOVIR HCL 1 G PO TABS: 1000.0000 mg | ORAL_TABLET | Freq: Three times a day (TID) | ORAL | 0 refills | Status: DC

## 2023-01-30 MED ORDER — ACETAMINOPHEN 325 MG PO TABS
650.0000 mg | ORAL_TABLET | Freq: Once | ORAL | Status: AC
  Administered 2023-01-30: 650 mg via ORAL
  Filled 2023-01-30: qty 2

## 2023-01-30 MED ORDER — LIDOCAINE 4 % EX GEL: 1.0000 "application " | CUTANEOUS | 1 refills | Status: DC | PRN

## 2023-01-30 MED ORDER — OXYCODONE HCL 5 MG PO TABS
5.0000 mg | ORAL_TABLET | Freq: Once | ORAL | Status: AC
  Administered 2023-01-30: 5 mg via ORAL
  Filled 2023-01-30: qty 1

## 2023-01-30 NOTE — ED Provider Notes (Signed)
[facility] Provider Note    Event Date/Time   First MD Initiated Contact with Patient 01/30/23 1354     (approximate)   History   Back Pain and Leg Pain   HPI  Pamela Ball is a 77 y.o. female with history of chronic pain syndrome, chronic knee pain, COPD, hypertension, bipolar, CVA, MI, and as listed in EMR presents to the emergency department for treatment and evaluation of painful rash around left side chest wall and back that started 2 days ago. 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  She is also concerned about having 2 abdominal hernias.      Physical Exam   Triage Vital Signs: ED Triage Vitals  Enc Vitals Group     BP 01/30/23 1351 (!) 164/65     Pulse Rate 01/30/23 1350 (!) 101     Resp 01/30/23 1350 18     Temp 01/30/23 1350 97.9 F (36.6 C)     Temp Source 01/30/23 1350 Oral     SpO2 01/30/23 1351 90 %     Weight 01/30/23 1351 103 lb 6.3 oz (46.9 kg)     Height 01/30/23 1351 4\' 10"  (1.473 m)     Head Circumference --      Peak Flow --      Pain Score 01/30/23 1351 10     Pain Loc --      Pain Edu? --      Excl. in [place]? --     Most recent vital signs: Vitals:   01/30/23 1351 01/30/23 1352  BP: (!) 164/65   Pulse:    Resp:    Temp:    SpO2: 90% 93%    General: Awake, no distress.  CV:  Good peripheral perfusion.  Resp:  Normal effort.  Abd:  No distention. Abdomen is soft. No hernias palpable. Other:  Vesicular rash in area of T5 on the left side that does not cross the midline.   ED Results / Procedures / Treatments   Labs (all labs ordered are listed, but only abnormal results are displayed) Labs Reviewed - No data to display   EKG  Not indciated.   RADIOLOGY  Image and radiology report reviewed and interpreted by me. Radiology report consistent with the same.  Not indicated.  PROCEDURES:  Critical Care performed:  No  Procedures   MEDICATIONS ORDERED IN ED:  Medications  oxyCODONE (Oxy IR/ROXICODONE) immediate release tablet 5 mg (5 mg Oral Given 01/30/23 1424)  acetaminophen (TYLENOL) tablet 650 mg (650 mg Oral Given 01/30/23 1424)     IMPRESSION / MDM / ASSESSMENT AND PLAN / ED COURSE   I have reviewed the triage note.  Differential diagnosis includes, but is not limited to, shingles, dermatitis.  Patient's presentation is most consistent with acute illness / injury with system symptoms.  77 year old female presenting to the emergency department for treatment and evaluation of painful vesicular rash on the left chest wall.  See HPI for further details.  Plan will be to treat her with valacyclovir since she is within the 48 to 72-hour window.  She will be given lidocaine gel for pain relief.  Her hydrocodone prescription was refilled hopefully to help with both the pain from shingles and her knee replacement.  She was encouraged to continue her physical therapy and see orthopedics as scheduled.      FINAL CLINICAL IMPRESSION(S) /  ED DIAGNOSES   Final diagnoses:  Herpes zoster without complication     Rx / DC Orders   ED Discharge Orders          Ordered    valACYclovir (VALTREX) 1000 MG tablet  3 times daily,   Status:  Discontinued        01/30/23 1449    Lidocaine 4 % GEL  Every 4 hours PRN        01/30/23 1449    HYDROcodone-acetaminophen (NORCO/VICODIN) 5-325 MG tablet  Every 6 hours PRN        01/30/23 1449    valACYclovir (VALTREX) 1000 MG tablet  3 times daily        01/30/23 1450             Note:  This document was prepared using Dragon voice recognition software and may include unintentional dictation errors.   Victorino Dike, FNP 01/30/23 1903    Merlyn Lot, MD 01/31/23 [phone]

## 2023-01-30 NOTE — ED Triage Notes (Signed)
Pt here with back pain and left leg pain. Pt states she had a recent left knee surgery and is still having pain in that leg. Pt also c/o all over back pain, pt states she has 2 hernias and also has shingles.

## 2023-01-31 ENCOUNTER — Ambulatory Visit: Payer: Self-pay | Admitting: *Deleted

## 2023-01-31 DIAGNOSIS — M810 Age-related osteoporosis without current pathological fracture: Secondary | ICD-10-CM | POA: Diagnosis not present

## 2023-01-31 DIAGNOSIS — E785 Hyperlipidemia, unspecified: Secondary | ICD-10-CM | POA: Diagnosis not present

## 2023-01-31 DIAGNOSIS — G8929 Other chronic pain: Secondary | ICD-10-CM | POA: Diagnosis not present

## 2023-01-31 DIAGNOSIS — K219 Gastro-esophageal reflux disease without esophagitis: Secondary | ICD-10-CM | POA: Diagnosis not present

## 2023-01-31 DIAGNOSIS — J439 Emphysema, unspecified: Secondary | ICD-10-CM | POA: Diagnosis not present

## 2023-01-31 DIAGNOSIS — K449 Diaphragmatic hernia without obstruction or gangrene: Secondary | ICD-10-CM | POA: Diagnosis not present

## 2023-01-31 DIAGNOSIS — Z96652 Presence of left artificial knee joint: Secondary | ICD-10-CM | POA: Diagnosis not present

## 2023-01-31 DIAGNOSIS — Z471 Aftercare following joint replacement surgery: Secondary | ICD-10-CM | POA: Diagnosis not present

## 2023-01-31 DIAGNOSIS — Z79891 Long term (current) use of opiate analgesic: Secondary | ICD-10-CM | POA: Diagnosis not present

## 2023-01-31 DIAGNOSIS — Z96619 Presence of unspecified artificial shoulder joint: Secondary | ICD-10-CM | POA: Diagnosis not present

## 2023-01-31 DIAGNOSIS — Z79899 Other long term (current) drug therapy: Secondary | ICD-10-CM | POA: Diagnosis not present

## 2023-01-31 DIAGNOSIS — Z7901 Long term (current) use of anticoagulants: Secondary | ICD-10-CM | POA: Diagnosis not present

## 2023-01-31 DIAGNOSIS — I10 Essential (primary) hypertension: Secondary | ICD-10-CM | POA: Diagnosis not present

## 2023-01-31 NOTE — Telephone Encounter (Signed)
Patient has apt tomorrow morning, will review and discuss at their apt. As they are requesting rx medication in addition to what was already provided yesterday in ED. There are not any rx topical pain relief for shingles. They have the correct rx, we can consider oral medication if needed.  [name], [facility] 01/31/2023, 5:59 PM

## 2023-01-31 NOTE — Telephone Encounter (Signed)
Summary: Shingles ?   Pt husband Pamela Ball states pt had knee replacement done on 01/18/23 and pt has shingles. Pamela Ball is wanting to see if something can be called into the pharmacy for the pt for her shingles (he does not want anything over the counter) Pamela Ball states that he took the pt to the ER yesterday and they did not do anything for her, only gave her Asper Cream. Please call Bobby back.       Chief Complaint: requesting medication not OTC for pain relief per patient's husband on DPR Symptoms: recent dx shingles left chest area to back. Seen in ED 01/30/23 . Taking valacyclovir 1000 mg 3 x day. Husband would like a different cream to apply on rash other than "asper creme" that he states ED recommended. S/p recent knee replacement 01/18/23 and reports patient is c/o hernia issues but no sx reported.  Frequency: 3 days  Pertinent Negatives: Patient denies fever, no spreading of rash  Disposition: [] ED /[] Urgent Care (no appt availability in office) / [x] Appointment(In office/virtual)/ []  [place] [facility]/ [] Home Care/ [] Refused Recommended Disposition /[] [place] [facility]/ []  Follow-up with PCP Additional Notes:   Appt scheduled for tomorrow. Please advise if medication can be prescribed today       Reason for Disposition  [1] Shingles rash (matches SYMPTOMS) AND [2] onset < 72 hours ago (3 days)  Answer Assessment - Initial Assessment Questions 1. APPEARANCE of RASH: "Describe the rash."      [name] on DPR reports rash and assessed in ED yesterday  2. LOCATION: "Where is the rash located?"      Left chest area to back  3. ONSET: "When did the rash start?"      Na  4. ITCHING: "Does the rash itch?" If Yes, ask: "How bad is the itch?"  (Scale 1-10; or mild, moderate, severe)     Yes  5. PAIN: "Does the rash hurt?" If Yes, ask: "How bad is the pain?"  (Scale 0-10; or none, mild, moderate, severe)    - NONE (0): no pain    - MILD (1-3): doesn't interfere with normal  activities     - MODERATE (4-7): interferes with normal activities or awakens from sleep     - SEVERE (8-10): excruciating pain, unable to do any normal activities     Yes  6. OTHER SYMPTOMS: "Do you have any other symptoms?" (e.g., fever)     No fever, recent knee replacement 01/18/23 hernia issues  7. PREGNANCY: "Is there any chance you are pregnant?" "When was your last menstrual period?"     na  Protocols used: Shingles (Zoster)-A-AH

## 2023-02-01 ENCOUNTER — Telehealth: Payer: Self-pay

## 2023-02-01 ENCOUNTER — Ambulatory Visit (INDEPENDENT_AMBULATORY_CARE_PROVIDER_SITE_OTHER): Payer: Medicare Other | Admitting: Family Medicine

## 2023-02-01 ENCOUNTER — Encounter: Payer: Self-pay | Admitting: Family Medicine

## 2023-02-01 VITALS — BP 136/70 | HR 93 | Ht <= 58 in | Wt 100.4 lb

## 2023-02-01 DIAGNOSIS — B029 Zoster without complications: Secondary | ICD-10-CM | POA: Diagnosis not present

## 2023-02-01 MED ORDER — GABAPENTIN 300 MG PO CAPS: 300.0000 mg | ORAL_CAPSULE | Freq: Every day | ORAL | 0 refills | Status: DC

## 2023-02-01 MED ORDER — TRIAMCINOLONE ACETONIDE 0.5 % EX CREA: 1.0000 | TOPICAL_CREAM | Freq: Two times a day (BID) | CUTANEOUS | 0 refills | Status: DC

## 2023-02-01 NOTE — Patient Instructions (Addendum)
Thank you for coming to the office today.  Shingles  Rx Gabapentin 300mg  nightly nerve pill  Triamcinolone topical steroid cream use twice a day for 2 weeks to reduce itching burning redness  Finish Valtrex as prescribed  If hernias worsening, would suggest CT of abdomen to repeat evaluation.  Please schedule a Follow-up Appointment to: Return if symptoms worsen or fail to improve.  If you have any other questions or concerns, please feel free to call the office or send a message through [place]. You may also schedule an earlier appointment if necessary.  Additionally, you may be receiving a survey about your experience at our office within a few days to 1 week by e-mail or mail. We value your feedback.  [name], DO [place]

## 2023-02-01 NOTE — Progress Notes (Signed)
Subjective:    Patient ID: Pamela Ball, female    DOB: 1946-01-25, 77 y.o.   MRN: [id number]  Pamela Ball is a 77 y.o. female presenting on 02/01/2023 for Herpes Zoster   HPI  ED FOLLOW-UP VISIT  Hospital/Location: [facility] Date of ED Visit: 01/30/23  Reason for Presenting to ED: Shingles  FOLLOW-UP  - ED provider note and record have been reviewed - Patient presents today about 2 days after recent ED visit. Brief summary of recent course, patient had symptoms of rash and pain , presented to ED, testing in ED with evaluation, treated with pain  med and valtrex. - Today reports overall has done well after discharge from ED. Symptoms of pain have improved but still has persistent rash and nerve pain  I have reviewed the discharge medication list, and have reconciled the current and discharge medications today.  She has not been on Gabapentin in years. Asking for topical rx for the rash as well  Admits past history of ventral hernia repair 2018-2019 She has some bilateral upper abdominal wall pain without obvious herniation Improved on pain pill Hydrocodone   No pain today  REPAIR VENTRAL HERNIA LAPAROSCOPIC   06/02/17, 08/29/18   Past Surgical History:  Procedure Laterality Date   ABDOMINAL HYSTERECTOMY  11/06/2006   APPENDECTOMY     BREAST BIOPSY Right 12/14/2020   stereo bx of distortion/asymmetry, coil marker, path pending   BREAST EXCISIONAL BIOPSY Left    age 35- benign    CESAREAN SECTION     x3   COLONOSCOPY WITH PROPOFOL N/A 06/25/2018   Procedure: COLONOSCOPY WITH PROPOFOL;  Surgeon: Jonathon Bellows, MD;  Location: [facility] ENDOSCOPY;  Service: Endoscopy;  Laterality: N/A;   KYPHOPLASTY     KYPHOPLASTY N/A 09/23/2018   Procedure: KYPHOPLASTY T10;  Surgeon: Hessie Knows, MD;  Location: [facility] ORS;  Service: Orthopedics;  Laterality: N/A;   KYPHOPLASTY N/A 03/11/2020   Procedure: L3 KYPHOPLASTY;  Surgeon: Hessie Knows, MD;  Location: [facility] ORS;  Service: Orthopedics;   Laterality: N/A;   KYPHOPLASTY N/A 12/30/2020   Procedure: L5 KYPHOPLASTY;  Surgeon: Hessie Knows, MD;  Location: [facility] ORS;  Service: Orthopedics;  Laterality: N/A;   ORIF FEMUR FRACTURE Left 06/23/2021   Procedure: OPEN REDUCTION INTERNAL FIXATION (ORIF) DISTAL FEMUR FRACTURE;  Surgeon: Hessie Knows, MD;  Location: [facility] ORS;  Service: Orthopedics;  Laterality: Left;   ORIF HUMERUS FRACTURE Left 08/07/2019   Procedure: OPEN REDUCTION INTERNAL FIXATION (ORIF) LEFT DISTAL HUMERUS FRACTURE;  Surgeon: Altamese [place], MD;  Location: [place];  Service: Orthopedics;  Laterality: Left;   ORIF HUMERUS FRACTURE Left 12/23/2021   Procedure: OPEN REDUCTION INTERNAL FIXATION (ORIF) HUMERAL SHAFT FRACTURE;  Surgeon: Leim Fabry, MD;  Location: [facility] ORS;  Service: Orthopedics;  Laterality: Left;   REVERSE SHOULDER ARTHROPLASTY Left 12/23/2021   Procedure: ATTEMPTED Left reverse shoulder arthroplasty;  Surgeon: Leim Fabry, MD;  Location: [facility] OR[facility];  Service: Orthopedics;  Laterality: Left;   VENTRAL HERNIA REPAIR N/A 06/02/2017   Procedure: exploratory laparotomy repair of incarcerated  VENTRAL hernia;  Surgeon: Florene Glen, MD;  Location: [facility] ORS;  Service: General;  Laterality: N/A;   VENTRAL HERNIA REPAIR N/A 08/29/2018   Procedure: LAPAROSCOPIC INCISIONAL HERNIA;  Surgeon: Olean Ree, MD;  Location: [facility] ORS;  Service: General;  Laterality: N/A;         02/01/2023    8:51 AM 10/19/2022   10:39 AM 05/25/2022    9:05 AM  Depression screen PHQ 2/9  Decreased Interest  0 0 0  Down, Depressed, Hopeless 0 0 0  PHQ - 2 Score 0 0 0  Altered sleeping 0 3 3  Tired, decreased energy 0 0 0  Change in appetite 0 3 3  Feeling bad or failure about yourself  0 0 0  Trouble concentrating 0 0 0  Moving slowly or fidgety/restless 0 0 0  Suicidal thoughts 0 0 0  PHQ-9 Score 0 6 6  Difficult doing work/chores Not difficult at all Not difficult at all Not difficult at all    Social History   Tobacco Use    Smoking status: Never   Smokeless tobacco: Never  Vaping Use   Vaping Use: Never used  Substance Use Topics   Alcohol use: No    Comment: drink occasionally    Drug use: No    Review of Systems Per HPI unless specifically indicated above     Objective:    BP 136/70   Pulse 93   Ht 4\' 10"  (1.473 m)   Wt 100 lb 6.4 oz (45.5 kg)   SpO2 94%   BMI 20.98 kg/m   Wt Readings from Last 3 Encounters:  02/01/23 100 lb 6.4 oz (45.5 kg)  01/30/23 103 lb 6.3 oz (46.9 kg)  12/04/22 103 lb 8 oz (46.9 kg)    Physical Exam Vitals and nursing note reviewed.  Constitutional:      General: She is not in acute distress.    Appearance: Normal appearance. She is well-developed. She is not diaphoretic.     Comments: Chronically ill appearing thin 77 yr female, mild discomfort, cooperative  HENT:     Head: Normocephalic and atraumatic.  Eyes:     General:        Right eye: No discharge.        Left eye: No discharge.     Conjunctiva/sclera: Conjunctivae normal.  Cardiovascular:     Rate and Rhythm: Normal rate.  Pulmonary:     Effort: Pulmonary effort is normal.  Musculoskeletal:     Comments: In wheelchair  Skin:    General: Skin is warm and dry.     Findings: Rash (left chest wall) present. No erythema.  Neurological:     Mental Status: She is alert and oriented to person, place, and time.  Psychiatric:        Mood and Affect: Mood normal.        Behavior: Behavior normal.        Thought Content: Thought content normal.     Comments: Well groomed, good eye contact, normal speech and thoughts    Results for orders placed or performed in visit on 12/04/22  Urine Culture   Specimen: Urine  Result Value Ref Range   MICRO NUMBER: [id number]    SPECIMEN QUALITY: Adequate    Sample Source URINE, CLEAN CATCH    STATUS: FINAL    Result: No Growth   Urinalysis, Routine w reflex microscopic  Result Value Ref Range   Color, Urine YELLOW YELLOW   APPearance CLEAR CLEAR   Specific  Gravity, Urine 1.012 1.001 - 1.035   pH 6.0 5.0 - 8.0   Glucose, UA NEGATIVE NEGATIVE   Bilirubin Urine NEGATIVE NEGATIVE   Ketones, ur NEGATIVE NEGATIVE   Hgb urine dipstick NEGATIVE NEGATIVE   Protein, ur NEGATIVE NEGATIVE   Nitrite NEGATIVE NEGATIVE   Leukocytes,Ua NEGATIVE NEGATIVE      Assessment & Plan:   Problem List Items Addressed This Visit   None Visit  Diagnoses     Herpes zoster without complication    -  Primary   Relevant Medications   triamcinolone cream (KENALOG) 0.5 %   gabapentin (NEURONTIN) 300 MG capsule       ED F/U 3/26 Treated for Shingles for the rash on abdomen/chest  Finish Valtrex as prescribed by ED  Add Rx Gabapentin 300mg  nightly nerve pill (she had been on Gabapentin in past briefly but not regular dosing)  Rx Triamcinolone topical steroid cream use twice a day for 2 weeks to reduce itching burning redness  History of Umbilical / Ventral Hernia repair years prior She has some area of pain discomfort on abdominal wall now, had been previously evaluated. No active herniation If hernias worsening, would suggest CT of abdomen to repeat evaluation.  Meds ordered this encounter  Medications   triamcinolone cream (KENALOG) 0.5 %    Sig: Apply 1 Application topically 2 (two) times daily. To affected areas, for up to 2 weeks for inflammation rash.    Dispense:  30 g    Refill:  0   gabapentin (NEURONTIN) 300 MG capsule    Sig: Take 1 capsule (300 mg total) by mouth at bedtime.    Dispense:  30 capsule    Refill:  0    Follow up plan: Return if symptoms worsen or fail to improve.   [name], [facility] 02/01/2023, 9:01 AM

## 2023-02-01 NOTE — Telephone Encounter (Signed)
        Patient  visited [place] on 3/26    Telephone encounter attempt :  1st  A HIPAA compliant voice message was left requesting a return call.  Instructed patient to call back .    [place] [phone] [address], [place],  [address] Phone: [phone] Email: [email or web address]

## 2023-02-02 ENCOUNTER — Telehealth: Payer: Self-pay

## 2023-02-02 NOTE — Telephone Encounter (Signed)
        Patient  visited [place] on 3/26  Telephone encounter attempt :  2nd  A HIPAA compliant voice message was left requesting a return call.  Instructed patient to call back.    [place] [phone] [address], [place], [place] [address] Phone: [phone] Email: [email or web address]

## 2023-02-06 DIAGNOSIS — M810 Age-related osteoporosis without current pathological fracture: Secondary | ICD-10-CM | POA: Diagnosis not present

## 2023-02-06 DIAGNOSIS — K219 Gastro-esophageal reflux disease without esophagitis: Secondary | ICD-10-CM | POA: Diagnosis not present

## 2023-02-06 DIAGNOSIS — Z96619 Presence of unspecified artificial shoulder joint: Secondary | ICD-10-CM | POA: Diagnosis not present

## 2023-02-06 DIAGNOSIS — Z471 Aftercare following joint replacement surgery: Secondary | ICD-10-CM | POA: Diagnosis not present

## 2023-02-06 DIAGNOSIS — E785 Hyperlipidemia, unspecified: Secondary | ICD-10-CM | POA: Diagnosis not present

## 2023-02-06 DIAGNOSIS — Z7901 Long term (current) use of anticoagulants: Secondary | ICD-10-CM | POA: Diagnosis not present

## 2023-02-06 DIAGNOSIS — Z79899 Other long term (current) drug therapy: Secondary | ICD-10-CM | POA: Diagnosis not present

## 2023-02-06 DIAGNOSIS — G8929 Other chronic pain: Secondary | ICD-10-CM | POA: Diagnosis not present

## 2023-02-06 DIAGNOSIS — Z96652 Presence of left artificial knee joint: Secondary | ICD-10-CM | POA: Diagnosis not present

## 2023-02-06 DIAGNOSIS — J439 Emphysema, unspecified: Secondary | ICD-10-CM | POA: Diagnosis not present

## 2023-02-06 DIAGNOSIS — I10 Essential (primary) hypertension: Secondary | ICD-10-CM | POA: Diagnosis not present

## 2023-02-06 DIAGNOSIS — K449 Diaphragmatic hernia without obstruction or gangrene: Secondary | ICD-10-CM | POA: Diagnosis not present

## 2023-02-06 DIAGNOSIS — Z79891 Long term (current) use of opiate analgesic: Secondary | ICD-10-CM | POA: Diagnosis not present

## 2023-02-07 DIAGNOSIS — K449 Diaphragmatic hernia without obstruction or gangrene: Secondary | ICD-10-CM | POA: Diagnosis not present

## 2023-02-07 DIAGNOSIS — E785 Hyperlipidemia, unspecified: Secondary | ICD-10-CM | POA: Diagnosis not present

## 2023-02-07 DIAGNOSIS — G8929 Other chronic pain: Secondary | ICD-10-CM | POA: Diagnosis not present

## 2023-02-07 DIAGNOSIS — Z7901 Long term (current) use of anticoagulants: Secondary | ICD-10-CM | POA: Diagnosis not present

## 2023-02-07 DIAGNOSIS — Z96652 Presence of left artificial knee joint: Secondary | ICD-10-CM | POA: Diagnosis not present

## 2023-02-07 DIAGNOSIS — K219 Gastro-esophageal reflux disease without esophagitis: Secondary | ICD-10-CM | POA: Diagnosis not present

## 2023-02-07 DIAGNOSIS — Z79899 Other long term (current) drug therapy: Secondary | ICD-10-CM | POA: Diagnosis not present

## 2023-02-07 DIAGNOSIS — M810 Age-related osteoporosis without current pathological fracture: Secondary | ICD-10-CM | POA: Diagnosis not present

## 2023-02-07 DIAGNOSIS — Z96619 Presence of unspecified artificial shoulder joint: Secondary | ICD-10-CM | POA: Diagnosis not present

## 2023-02-07 DIAGNOSIS — Z471 Aftercare following joint replacement surgery: Secondary | ICD-10-CM | POA: Diagnosis not present

## 2023-02-07 DIAGNOSIS — Z79891 Long term (current) use of opiate analgesic: Secondary | ICD-10-CM | POA: Diagnosis not present

## 2023-02-07 DIAGNOSIS — I10 Essential (primary) hypertension: Secondary | ICD-10-CM | POA: Diagnosis not present

## 2023-02-07 DIAGNOSIS — J439 Emphysema, unspecified: Secondary | ICD-10-CM | POA: Diagnosis not present

## 2023-02-09 DIAGNOSIS — E785 Hyperlipidemia, unspecified: Secondary | ICD-10-CM | POA: Diagnosis not present

## 2023-02-09 DIAGNOSIS — Z471 Aftercare following joint replacement surgery: Secondary | ICD-10-CM | POA: Diagnosis not present

## 2023-02-09 DIAGNOSIS — Z7901 Long term (current) use of anticoagulants: Secondary | ICD-10-CM | POA: Diagnosis not present

## 2023-02-09 DIAGNOSIS — Z96652 Presence of left artificial knee joint: Secondary | ICD-10-CM | POA: Diagnosis not present

## 2023-02-09 DIAGNOSIS — G8929 Other chronic pain: Secondary | ICD-10-CM | POA: Diagnosis not present

## 2023-02-09 DIAGNOSIS — I10 Essential (primary) hypertension: Secondary | ICD-10-CM | POA: Diagnosis not present

## 2023-02-09 DIAGNOSIS — Z79899 Other long term (current) drug therapy: Secondary | ICD-10-CM | POA: Diagnosis not present

## 2023-02-09 DIAGNOSIS — Z79891 Long term (current) use of opiate analgesic: Secondary | ICD-10-CM | POA: Diagnosis not present

## 2023-02-09 DIAGNOSIS — K449 Diaphragmatic hernia without obstruction or gangrene: Secondary | ICD-10-CM | POA: Diagnosis not present

## 2023-02-09 DIAGNOSIS — K219 Gastro-esophageal reflux disease without esophagitis: Secondary | ICD-10-CM | POA: Diagnosis not present

## 2023-02-09 DIAGNOSIS — Z96619 Presence of unspecified artificial shoulder joint: Secondary | ICD-10-CM | POA: Diagnosis not present

## 2023-02-09 DIAGNOSIS — M810 Age-related osteoporosis without current pathological fracture: Secondary | ICD-10-CM | POA: Diagnosis not present

## 2023-02-09 DIAGNOSIS — J439 Emphysema, unspecified: Secondary | ICD-10-CM | POA: Diagnosis not present

## 2023-02-13 ENCOUNTER — Other Ambulatory Visit: Payer: Self-pay | Admitting: Family Medicine

## 2023-02-13 DIAGNOSIS — G894 Chronic pain syndrome: Secondary | ICD-10-CM

## 2023-02-13 DIAGNOSIS — K449 Diaphragmatic hernia without obstruction or gangrene: Secondary | ICD-10-CM | POA: Diagnosis not present

## 2023-02-13 DIAGNOSIS — J439 Emphysema, unspecified: Secondary | ICD-10-CM | POA: Diagnosis not present

## 2023-02-13 DIAGNOSIS — E785 Hyperlipidemia, unspecified: Secondary | ICD-10-CM | POA: Diagnosis not present

## 2023-02-13 DIAGNOSIS — Z79891 Long term (current) use of opiate analgesic: Secondary | ICD-10-CM | POA: Diagnosis not present

## 2023-02-13 DIAGNOSIS — Z96652 Presence of left artificial knee joint: Secondary | ICD-10-CM | POA: Diagnosis not present

## 2023-02-13 DIAGNOSIS — Z79899 Other long term (current) drug therapy: Secondary | ICD-10-CM | POA: Diagnosis not present

## 2023-02-13 DIAGNOSIS — Z471 Aftercare following joint replacement surgery: Secondary | ICD-10-CM | POA: Diagnosis not present

## 2023-02-13 DIAGNOSIS — Z96619 Presence of unspecified artificial shoulder joint: Secondary | ICD-10-CM | POA: Diagnosis not present

## 2023-02-13 DIAGNOSIS — M170 Bilateral primary osteoarthritis of knee: Secondary | ICD-10-CM

## 2023-02-13 DIAGNOSIS — K219 Gastro-esophageal reflux disease without esophagitis: Secondary | ICD-10-CM | POA: Diagnosis not present

## 2023-02-13 DIAGNOSIS — G8929 Other chronic pain: Secondary | ICD-10-CM | POA: Diagnosis not present

## 2023-02-13 DIAGNOSIS — Z7901 Long term (current) use of anticoagulants: Secondary | ICD-10-CM | POA: Diagnosis not present

## 2023-02-13 DIAGNOSIS — M810 Age-related osteoporosis without current pathological fracture: Secondary | ICD-10-CM | POA: Diagnosis not present

## 2023-02-13 DIAGNOSIS — I10 Essential (primary) hypertension: Secondary | ICD-10-CM | POA: Diagnosis not present

## 2023-02-13 MED ORDER — HYDROCODONE-ACETAMINOPHEN 5-325 MG PO TABS: 1.0000 | ORAL_TABLET | Freq: Four times a day (QID) | ORAL | 0 refills | Status: DC | PRN

## 2023-02-13 NOTE — Telephone Encounter (Signed)
Requested medication (s) are due for refill today - provider review   Requested medication (s) are on the active medication list -yes  Future visit scheduled -no  Last refill: 01/30/23 #12  Notes to clinic: non delegated Rx, outside provider  Requested Prescriptions  Pending Prescriptions Disp Refills   HYDROcodone-acetaminophen (NORCO/VICODIN) 5-325 MG tablet 12 tablet 0    Sig: Take 1 tablet by mouth every 6 (six) hours as needed for moderate pain.     Not Delegated - Analgesics:  Opioid Agonist Combinations Failed - 02/13/2023 12:14 PM      Failed - This refill cannot be delegated      Failed - Urine Drug Screen completed in last 360 days      Passed - Valid encounter within last 3 months    Recent Outpatient Visits           1 week ago Herpes zoster without complication   [place] [facility] , Netta Neat, DO   2 months ago Acute cystitis without hematuria   [place] [facility], Netta Neat, DO   2 months ago Left hip pain   [place] [facility] Smitty Cords, DO   3 months ago Pre-op evaluation   [facility], Netta Neat, DO   6 months ago Primary osteoarthritis of both knees   [place] [facility] Smitty Cords, [place]                 Requested Prescriptions  Pending Prescriptions Disp Refills   HYDROcodone-acetaminophen (NORCO/VICODIN) 5-325 MG tablet 12 tablet 0    Sig: Take 1 tablet by mouth every 6 (six) hours as needed for moderate pain.     Not Delegated - Analgesics:  Opioid Agonist Combinations Failed - 02/13/2023 12:14 PM      Failed - This refill cannot be delegated      Failed - Urine Drug Screen completed in last 360 days      Passed - Valid encounter within last 3 months    Recent Outpatient Visits           1 week ago Herpes zoster without complication   [place] [facility] [place], Netta Neat, DO   2 months ago Acute cystitis without hematuria   [place] [facility] Smitty Cords, DO   2 months ago Left hip pain   [place] [facility] Smitty Cords, DO   3 months ago Pre-op evaluation   [place] [facility] Smitty Cords, DO   6 months ago Primary osteoarthritis of both knees   [place] [facility] [place], Netta Neat, [place]

## 2023-02-13 NOTE — Telephone Encounter (Signed)
Medication Refill - Medication: HYDROcodone-acetaminophen (NORCO/VICODIN) 5-325 MG tablet   Has the patient contacted their pharmacy? No.  Preferred Pharmacy (with phone number or street name):  [id number] - [place], [place] - [address] Phone: [phone]  Fax: [phone]     Has the patient been seen for an appointment in the last year OR does the patient have an upcoming appointment? Yes.    Agent: Please be advised that RX refills may take up to 3 business days. We ask that you follow-up with your pharmacy.  

## 2023-02-19 ENCOUNTER — Telehealth: Payer: Self-pay | Admitting: Family Medicine

## 2023-02-19 NOTE — Telephone Encounter (Signed)
Pamela  Mat Ball  is requesting a  call back [phone] she wanted to know if you have any concerns about the pt health

## 2023-02-19 NOTE — Telephone Encounter (Signed)
I called Pamela Ball back and did not reach her. I left a detailed voicemail for her.  I don't have significant concerns with patient's no shows or missed apts. She seems to be making it to all of her appt as scheduled. There was one cancelled apt in January, but past 6 months has been without issue.  If she has additional questions for me, please let me know.  I gave her my contact # as well if she needs  Pamela Pilar, DO [facility] 02/19/2023, 1:03 PM

## 2023-02-19 NOTE — Telephone Encounter (Signed)
Copied from CRM [id number]. Topic: General - Other >> Feb 19, 2023  9:36 AM Epimenio Foot F wrote: Reason for CRM: Junious Silk with [facility] DSS is calling in requesting to speak with someone regarding the patient's appointments. DSS wants to know if the pt's appointments are compliant. Please follow up with Crystal. >> Feb 19, 2023 10:16 AM Danella Maiers wrote: Left message for Crystal on VM.

## 2023-02-20 DIAGNOSIS — Z96619 Presence of unspecified artificial shoulder joint: Secondary | ICD-10-CM | POA: Diagnosis not present

## 2023-02-20 DIAGNOSIS — G8929 Other chronic pain: Secondary | ICD-10-CM | POA: Diagnosis not present

## 2023-02-20 DIAGNOSIS — J439 Emphysema, unspecified: Secondary | ICD-10-CM | POA: Diagnosis not present

## 2023-02-20 DIAGNOSIS — Z79899 Other long term (current) drug therapy: Secondary | ICD-10-CM | POA: Diagnosis not present

## 2023-02-20 DIAGNOSIS — I10 Essential (primary) hypertension: Secondary | ICD-10-CM | POA: Diagnosis not present

## 2023-02-20 DIAGNOSIS — Z96652 Presence of left artificial knee joint: Secondary | ICD-10-CM | POA: Diagnosis not present

## 2023-02-20 DIAGNOSIS — Z471 Aftercare following joint replacement surgery: Secondary | ICD-10-CM | POA: Diagnosis not present

## 2023-02-20 DIAGNOSIS — E785 Hyperlipidemia, unspecified: Secondary | ICD-10-CM | POA: Diagnosis not present

## 2023-02-20 DIAGNOSIS — K449 Diaphragmatic hernia without obstruction or gangrene: Secondary | ICD-10-CM | POA: Diagnosis not present

## 2023-02-20 DIAGNOSIS — Z79891 Long term (current) use of opiate analgesic: Secondary | ICD-10-CM | POA: Diagnosis not present

## 2023-02-20 DIAGNOSIS — Z7901 Long term (current) use of anticoagulants: Secondary | ICD-10-CM | POA: Diagnosis not present

## 2023-02-20 DIAGNOSIS — M810 Age-related osteoporosis without current pathological fracture: Secondary | ICD-10-CM | POA: Diagnosis not present

## 2023-02-20 DIAGNOSIS — K219 Gastro-esophageal reflux disease without esophagitis: Secondary | ICD-10-CM | POA: Diagnosis not present

## 2023-02-22 DIAGNOSIS — G8929 Other chronic pain: Secondary | ICD-10-CM | POA: Diagnosis not present

## 2023-02-22 DIAGNOSIS — Z79891 Long term (current) use of opiate analgesic: Secondary | ICD-10-CM | POA: Diagnosis not present

## 2023-02-22 DIAGNOSIS — Z7901 Long term (current) use of anticoagulants: Secondary | ICD-10-CM | POA: Diagnosis not present

## 2023-02-22 DIAGNOSIS — Z471 Aftercare following joint replacement surgery: Secondary | ICD-10-CM | POA: Diagnosis not present

## 2023-02-22 DIAGNOSIS — M810 Age-related osteoporosis without current pathological fracture: Secondary | ICD-10-CM | POA: Diagnosis not present

## 2023-02-22 DIAGNOSIS — Z96652 Presence of left artificial knee joint: Secondary | ICD-10-CM | POA: Diagnosis not present

## 2023-02-22 DIAGNOSIS — I10 Essential (primary) hypertension: Secondary | ICD-10-CM | POA: Diagnosis not present

## 2023-02-22 DIAGNOSIS — K449 Diaphragmatic hernia without obstruction or gangrene: Secondary | ICD-10-CM | POA: Diagnosis not present

## 2023-02-22 DIAGNOSIS — K219 Gastro-esophageal reflux disease without esophagitis: Secondary | ICD-10-CM | POA: Diagnosis not present

## 2023-02-22 DIAGNOSIS — J439 Emphysema, unspecified: Secondary | ICD-10-CM | POA: Diagnosis not present

## 2023-02-22 DIAGNOSIS — Z96619 Presence of unspecified artificial shoulder joint: Secondary | ICD-10-CM | POA: Diagnosis not present

## 2023-02-22 DIAGNOSIS — E785 Hyperlipidemia, unspecified: Secondary | ICD-10-CM | POA: Diagnosis not present

## 2023-02-22 DIAGNOSIS — Z79899 Other long term (current) drug therapy: Secondary | ICD-10-CM | POA: Diagnosis not present

## 2023-02-28 DIAGNOSIS — E785 Hyperlipidemia, unspecified: Secondary | ICD-10-CM | POA: Diagnosis not present

## 2023-02-28 DIAGNOSIS — Z79899 Other long term (current) drug therapy: Secondary | ICD-10-CM | POA: Diagnosis not present

## 2023-02-28 DIAGNOSIS — G8929 Other chronic pain: Secondary | ICD-10-CM | POA: Diagnosis not present

## 2023-02-28 DIAGNOSIS — K219 Gastro-esophageal reflux disease without esophagitis: Secondary | ICD-10-CM | POA: Diagnosis not present

## 2023-02-28 DIAGNOSIS — K449 Diaphragmatic hernia without obstruction or gangrene: Secondary | ICD-10-CM | POA: Diagnosis not present

## 2023-02-28 DIAGNOSIS — Z96652 Presence of left artificial knee joint: Secondary | ICD-10-CM | POA: Diagnosis not present

## 2023-02-28 DIAGNOSIS — M810 Age-related osteoporosis without current pathological fracture: Secondary | ICD-10-CM | POA: Diagnosis not present

## 2023-02-28 DIAGNOSIS — Z471 Aftercare following joint replacement surgery: Secondary | ICD-10-CM | POA: Diagnosis not present

## 2023-02-28 DIAGNOSIS — I10 Essential (primary) hypertension: Secondary | ICD-10-CM | POA: Diagnosis not present

## 2023-02-28 DIAGNOSIS — Z7901 Long term (current) use of anticoagulants: Secondary | ICD-10-CM | POA: Diagnosis not present

## 2023-02-28 DIAGNOSIS — Z96619 Presence of unspecified artificial shoulder joint: Secondary | ICD-10-CM | POA: Diagnosis not present

## 2023-02-28 DIAGNOSIS — Z79891 Long term (current) use of opiate analgesic: Secondary | ICD-10-CM | POA: Diagnosis not present

## 2023-02-28 DIAGNOSIS — J439 Emphysema, unspecified: Secondary | ICD-10-CM | POA: Diagnosis not present

## 2023-03-06 ENCOUNTER — Other Ambulatory Visit: Payer: Self-pay | Admitting: Family Medicine

## 2023-03-06 DIAGNOSIS — J441 Chronic obstructive pulmonary disease with (acute) exacerbation: Secondary | ICD-10-CM

## 2023-03-06 DIAGNOSIS — J432 Centrilobular emphysema: Secondary | ICD-10-CM

## 2023-03-06 MED ORDER — ALBUTEROL SULFATE HFA 108 (90 BASE) MCG/ACT IN AERS: INHALATION_SPRAY | RESPIRATORY_TRACT | 1 refills | Status: DC

## 2023-03-07 DIAGNOSIS — K219 Gastro-esophageal reflux disease without esophagitis: Secondary | ICD-10-CM | POA: Diagnosis not present

## 2023-03-07 DIAGNOSIS — Z7901 Long term (current) use of anticoagulants: Secondary | ICD-10-CM | POA: Diagnosis not present

## 2023-03-07 DIAGNOSIS — J439 Emphysema, unspecified: Secondary | ICD-10-CM | POA: Diagnosis not present

## 2023-03-07 DIAGNOSIS — Z79891 Long term (current) use of opiate analgesic: Secondary | ICD-10-CM | POA: Diagnosis not present

## 2023-03-07 DIAGNOSIS — K449 Diaphragmatic hernia without obstruction or gangrene: Secondary | ICD-10-CM | POA: Diagnosis not present

## 2023-03-07 DIAGNOSIS — Z79899 Other long term (current) drug therapy: Secondary | ICD-10-CM | POA: Diagnosis not present

## 2023-03-07 DIAGNOSIS — Z96619 Presence of unspecified artificial shoulder joint: Secondary | ICD-10-CM | POA: Diagnosis not present

## 2023-03-07 DIAGNOSIS — M810 Age-related osteoporosis without current pathological fracture: Secondary | ICD-10-CM | POA: Diagnosis not present

## 2023-03-07 DIAGNOSIS — G8929 Other chronic pain: Secondary | ICD-10-CM | POA: Diagnosis not present

## 2023-03-07 DIAGNOSIS — Z96652 Presence of left artificial knee joint: Secondary | ICD-10-CM | POA: Diagnosis not present

## 2023-03-07 DIAGNOSIS — E785 Hyperlipidemia, unspecified: Secondary | ICD-10-CM | POA: Diagnosis not present

## 2023-03-07 DIAGNOSIS — Z471 Aftercare following joint replacement surgery: Secondary | ICD-10-CM | POA: Diagnosis not present

## 2023-03-07 DIAGNOSIS — I10 Essential (primary) hypertension: Secondary | ICD-10-CM | POA: Diagnosis not present

## 2023-03-12 DIAGNOSIS — Z96652 Presence of left artificial knee joint: Secondary | ICD-10-CM | POA: Diagnosis not present

## 2023-03-12 DIAGNOSIS — K219 Gastro-esophageal reflux disease without esophagitis: Secondary | ICD-10-CM | POA: Diagnosis not present

## 2023-03-12 DIAGNOSIS — I10 Essential (primary) hypertension: Secondary | ICD-10-CM | POA: Diagnosis not present

## 2023-03-12 DIAGNOSIS — Z79891 Long term (current) use of opiate analgesic: Secondary | ICD-10-CM | POA: Diagnosis not present

## 2023-03-12 DIAGNOSIS — J439 Emphysema, unspecified: Secondary | ICD-10-CM | POA: Diagnosis not present

## 2023-03-12 DIAGNOSIS — Z471 Aftercare following joint replacement surgery: Secondary | ICD-10-CM | POA: Diagnosis not present

## 2023-03-12 DIAGNOSIS — Z79899 Other long term (current) drug therapy: Secondary | ICD-10-CM | POA: Diagnosis not present

## 2023-03-12 DIAGNOSIS — M810 Age-related osteoporosis without current pathological fracture: Secondary | ICD-10-CM | POA: Diagnosis not present

## 2023-03-12 DIAGNOSIS — Z96619 Presence of unspecified artificial shoulder joint: Secondary | ICD-10-CM | POA: Diagnosis not present

## 2023-03-12 DIAGNOSIS — G8929 Other chronic pain: Secondary | ICD-10-CM | POA: Diagnosis not present

## 2023-03-12 DIAGNOSIS — M1712 Unilateral primary osteoarthritis, left knee: Secondary | ICD-10-CM | POA: Diagnosis not present

## 2023-03-12 DIAGNOSIS — Z7901 Long term (current) use of anticoagulants: Secondary | ICD-10-CM | POA: Diagnosis not present

## 2023-03-12 DIAGNOSIS — K449 Diaphragmatic hernia without obstruction or gangrene: Secondary | ICD-10-CM | POA: Diagnosis not present

## 2023-03-12 DIAGNOSIS — E785 Hyperlipidemia, unspecified: Secondary | ICD-10-CM | POA: Diagnosis not present

## 2023-03-15 ENCOUNTER — Encounter: Payer: Self-pay | Admitting: Family Medicine

## 2023-03-15 ENCOUNTER — Ambulatory Visit (INDEPENDENT_AMBULATORY_CARE_PROVIDER_SITE_OTHER): Payer: Medicare Other | Admitting: Family Medicine

## 2023-03-15 VITALS — BP 132/68 | HR 94 | Ht <= 58 in | Wt 95.0 lb

## 2023-03-15 DIAGNOSIS — L570 Actinic keratosis: Secondary | ICD-10-CM | POA: Diagnosis not present

## 2023-03-15 NOTE — Patient Instructions (Addendum)
Thank you for coming to the office today.  Referral to Dermatologist, they will likely freeze cryotherapy for this spot on the forehead.  It does not look cancerous.  May try topical cortisone if it is irritated or itching or inflamed.  [facility] Dermatology Dermatologist in [place] Address: [address] [place], [place] [address] Phone: [phone]  [facility]   [address] [place], [place] [address] Hours: 8AM-5PM Phone: [phone]  [facility] Dermatology [address] [place], [place] [address] Phone: [phone]  Actinic Keratosis An actinic keratosis is a precancerous growth on the skin. If there is more than one, the condition is called actinic keratoses. These growths appear most often on parts of the skin that get a lot of sun exposure, including the: Scalp. Face. Ears. Lips. Upper back. Forearms. Backs of the hands. If left untreated, these growths may develop into a skin cancer called squamous cell carcinoma. It is important to have all these growths checked by a health care provider to determine the best treatment. What are the causes? Actinic keratoses are caused by getting too much ultraviolet (UV) radiation from the sun or other UV light sources. What increases the risk? You are more likely to develop this condition if you: Have light-colored skin or blue eyes. Have blond or red hair. Spend a lot of time in the sun. Do not protect your skin from the sun when outdoors. Are an older person. The risk of developing an actinic keratosis increases with age. What are the signs or symptoms? These growths feel like scaly, rough spots of skin. Symptoms of this condition include growths that may: Be as small as a pinhead or as big as a quarter. Itch, hurt, or feel sensitive. Be skin-colored, light tan, dark tan, pink, or a combination of these colors. In most cases, the growths become red. Have a small piece of pink or gray skin (skin tag)  growing from them. It may be easier to notice the growths by feeling them rather than seeing them. Sometimes, actinic keratoses disappear but may return a few days to a few weeks later. How is this diagnosed? This condition is usually diagnosed with a physical exam. A tissue sample may be removed from the growth and examined under a microscope (biopsy). How is this treated? This condition may be treated by: Scraping off the actinic keratosis (curettage). Freezing the actinic keratosis with liquid nitrogen (cryosurgery). This causes the growth to eventually fall off. Applying medicated creams or gels to destroy the cells in the growth. Applying chemicals to the growth to make the outer layers of skin peel off (chemical peel). Using photodynamic therapy. In this procedure, medicated cream is applied to the actinic keratosis. This cream increases your skin's sensitivity to light. Then, a strong light is aimed at the actinic keratosis to destroy cells in the growth. Follow these instructions at home: Skin care Apply cool, wet cloths (coolcompresses) to the affected areas. Do not scratch your skin. Check your skin regularly for any growths, especially ones that: Start to itch or bleed. Change in size, shape, or color. Caring for the treated area Keep the treated area clean and dry as told by your health care provider. Do not apply any medicine, cream, or lotion to the treated area unless your health care provider tells you to do that. Do not pick at blisters or try to break them open. This can cause infection and scarring. If you have red or irritated skin after treatment, follow  instructions from your health care provider about how to take care of the treated area. Make sure you: Wash your hands with soap and water for at least 20 seconds before and after you change your bandage (dressing). If soap and water are not available, use hand sanitizer. Change your dressing as told by your health care  provider. If you have red or irritated skin after treatment, check the treated area every day for signs of infection. Check for: Redness, swelling, or pain. Fluid or blood. Warmth. Pus or a bad smell. Lifestyle Do not use any products that contain nicotine or tobacco. These products include cigarettes, chewing tobacco, and vaping devices, such as e-cigarettes. If you need help quitting, ask your health care provider. Take steps to protect your skin from the sun, such as: Avoiding the sun between 10:00 a.m. and 4:00 p.m. This is when the UV light is the strongest. Using a sunscreen or sunblock with SPF 30 or greater. Applying sunscreen before you are exposed to sunlight and reapplying as often as told by the instructions on the sunscreen container. Wearing protective gear, including: Sunglasses with UV protection. A hat and clothing that protect your skin from sunlight. Avoiding medicines that increase your sensitivity to sunlight when possible. Avoidingtanning beds and other indoor tanning devices. General instructions Take or apply over-the-counter and prescription medicines only as told by your health care provider. Return to your normal activities as told by your health care provider. Ask your health care provider what activities are safe for you. Have a skin exam done every year by a health care provider who is a skin specialist (dermatologist). Keep all follow-up visits. Your health care provider will want to check that the site has healed after treatment. Contact a health care provider if: You notice any changes or new growths on your skin. You have swelling, pain, or redness around your treated area. You have fluid or blood coming from your treated area. Your treated area feels warm to the touch. You have pus or a bad smell coming from your treated area. You have a fever or chills. You have a blister that becomes large and painful. Summary An actinic keratosis is a precancerous  growth on the skin.If left untreated, these growths can develop into skin cancer. Check your skin regularly for any growths, especially growths that start to itch or bleed, or change in size, shape, or color. Take steps to protect your skin from the sun. Contact a health care provider if you notice any changes or new growths on your skin. This information is not intended to replace advice given to you by your health care provider. Make sure you discuss any questions you have with your health care provider. Document Revised: 01/05/2022 Document Reviewed: 01/05/2022 Elsevier Patient Education  2023 Elsevier Inc.   Please schedule a Follow-up Appointment to: Return if symptoms worsen or fail to improve.  If you have any other questions or concerns, please feel free to call the office or send a message through MyChart. You may also schedule an earlier appointment if necessary.  Additionally, you may be receiving a survey about your experience at our office within a few days to 1 week by e-mail or mail. We value your feedback.  [name], DO [facility], [place]

## 2023-03-15 NOTE — Progress Notes (Addendum)
Subjective:    Patient ID: Pamela Ball, female    DOB: Mar 31, 1946, 77 y.o.   MRN: [id number]  Pamela Ball is a 77 y.o. female presenting on 03/15/2023 for Skin Problem (Spot on forehead, present about 2 months, has not tried any teatment)   HPI  Actinic keratosis forehead History of raised blister on forehead previously, present for 2 months it drained and then left dry rough skin lesion, irritated skin. No redness or drainage or bleeding. Not using topical      03/15/2023    9:33 AM 02/01/2023    8:51 AM 10/19/2022   10:39 AM  Depression screen PHQ 2/9  Decreased Interest 0 0 0  Down, Depressed, Hopeless 0 0 0  PHQ - 2 Score 0 0 0  Altered sleeping  0 3  Tired, decreased energy  0 0  Change in appetite  0 3  Feeling bad or failure about yourself   0 0  Trouble concentrating  0 0  Moving slowly or fidgety/restless  0 0  Suicidal thoughts  0 0  PHQ-9 Score  0 6  Difficult doing work/chores  Not difficult at all Not difficult at all    Social History   Tobacco Use   Smoking status: Never   Smokeless tobacco: Never  Vaping Use   Vaping Use: Never used  Substance Use Topics   Alcohol use: No    Comment: drink occasionally    Drug use: No    Review of Systems Per HPI unless specifically indicated above     Objective:    BP 132/68 (BP Location: Left Arm, Cuff Size: Normal)   Pulse 94   Ht 4\' 10"  (1.473 m)   Wt 95 lb (43.1 kg)   SpO2 94%   BMI 19.86 kg/m   Wt Readings from Last 3 Encounters:  03/15/23 95 lb (43.1 kg)  02/01/23 100 lb 6.4 oz (45.5 kg)  01/30/23 103 lb 6.3 oz (46.9 kg)    Physical Exam Vitals and nursing note reviewed.  Constitutional:      General: She is not in acute distress.    Appearance: Normal appearance. She is well-developed. She is not diaphoretic.     Comments: Well-appearing, comfortable, cooperative  HENT:     Head: Normocephalic and atraumatic.  Eyes:     General:        Right eye: No discharge.        Left eye:  No discharge.     Conjunctiva/sclera: Conjunctivae normal.  Cardiovascular:     Rate and Rhythm: Normal rate.  Pulmonary:     Effort: Pulmonary effort is normal.  Skin:    General: Skin is warm and dry.     Findings: Lesion (Forehead 0.5 cm raised rough scaly skin lesion consistent with AK) present. No erythema or rash.  Neurological:     Mental Status: She is alert and oriented to person, place, and time.  Psychiatric:        Mood and Affect: Mood normal.        Behavior: Behavior normal.        Thought Content: Thought content normal.     Comments: Well groomed, good eye contact, normal speech and thoughts      Results for orders placed or performed in visit on 12/04/22  Urine Culture   Specimen: Urine  Result Value Ref Range   MICRO NUMBER: [id number]    SPECIMEN QUALITY: Adequate    Sample Source URINE, CLEAN  CATCH    STATUS: FINAL    Result: No Growth   Urinalysis, Routine w reflex microscopic  Result Value Ref Range   Color, Urine YELLOW YELLOW   APPearance CLEAR CLEAR   Specific Gravity, Urine 1.012 1.001 - 1.035   pH 6.0 5.0 - 8.0   Glucose, UA NEGATIVE NEGATIVE   Bilirubin Urine NEGATIVE NEGATIVE   Ketones, ur NEGATIVE NEGATIVE   Hgb urine dipstick NEGATIVE NEGATIVE   Protein, ur NEGATIVE NEGATIVE   Nitrite NEGATIVE NEGATIVE   Leukocytes,Ua NEGATIVE NEGATIVE      Assessment & Plan:   Problem List Items Addressed This Visit   None Visit Diagnoses     Actinic keratosis    -  Primary   Relevant Orders   Ambulatory referral to Dermatology       raised inflamed actinic keratosis skin lesion on forehead. She had prior blister it seems in this region now left with rough raised abnormal skin lesion.   No sign of pigmented lesion or infection  Request cryotherapy will refer to Dermatology.  Orders Placed This Encounter  Procedures   Ambulatory referral to Dermatology    Referral Priority:   Routine    Referral Type:   Consultation    Referral Reason:    Specialty Services Required    Requested Specialty:   Dermatology    Number of Visits Requested:   1     No orders of the defined types were placed in this encounter.     Follow up plan: Return if symptoms worsen or fail to improve.   [name], DO [facility] 03/15/2023, 10:04 AM

## 2023-03-22 DIAGNOSIS — Z471 Aftercare following joint replacement surgery: Secondary | ICD-10-CM | POA: Diagnosis not present

## 2023-03-22 DIAGNOSIS — Z7901 Long term (current) use of anticoagulants: Secondary | ICD-10-CM | POA: Diagnosis not present

## 2023-03-22 DIAGNOSIS — Z79891 Long term (current) use of opiate analgesic: Secondary | ICD-10-CM | POA: Diagnosis not present

## 2023-03-22 DIAGNOSIS — Z96652 Presence of left artificial knee joint: Secondary | ICD-10-CM | POA: Diagnosis not present

## 2023-03-22 DIAGNOSIS — E785 Hyperlipidemia, unspecified: Secondary | ICD-10-CM | POA: Diagnosis not present

## 2023-03-22 DIAGNOSIS — K449 Diaphragmatic hernia without obstruction or gangrene: Secondary | ICD-10-CM | POA: Diagnosis not present

## 2023-03-22 DIAGNOSIS — G8929 Other chronic pain: Secondary | ICD-10-CM | POA: Diagnosis not present

## 2023-03-22 DIAGNOSIS — Z96619 Presence of unspecified artificial shoulder joint: Secondary | ICD-10-CM | POA: Diagnosis not present

## 2023-03-22 DIAGNOSIS — I10 Essential (primary) hypertension: Secondary | ICD-10-CM | POA: Diagnosis not present

## 2023-03-22 DIAGNOSIS — Z79899 Other long term (current) drug therapy: Secondary | ICD-10-CM | POA: Diagnosis not present

## 2023-03-22 DIAGNOSIS — M810 Age-related osteoporosis without current pathological fracture: Secondary | ICD-10-CM | POA: Diagnosis not present

## 2023-03-22 DIAGNOSIS — J439 Emphysema, unspecified: Secondary | ICD-10-CM | POA: Diagnosis not present

## 2023-03-22 DIAGNOSIS — K219 Gastro-esophageal reflux disease without esophagitis: Secondary | ICD-10-CM | POA: Diagnosis not present

## 2023-03-27 DIAGNOSIS — K449 Diaphragmatic hernia without obstruction or gangrene: Secondary | ICD-10-CM | POA: Diagnosis not present

## 2023-03-27 DIAGNOSIS — J439 Emphysema, unspecified: Secondary | ICD-10-CM | POA: Diagnosis not present

## 2023-03-27 DIAGNOSIS — Z96652 Presence of left artificial knee joint: Secondary | ICD-10-CM | POA: Diagnosis not present

## 2023-03-27 DIAGNOSIS — Z79899 Other long term (current) drug therapy: Secondary | ICD-10-CM | POA: Diagnosis not present

## 2023-03-27 DIAGNOSIS — Z79891 Long term (current) use of opiate analgesic: Secondary | ICD-10-CM | POA: Diagnosis not present

## 2023-03-27 DIAGNOSIS — Z96619 Presence of unspecified artificial shoulder joint: Secondary | ICD-10-CM | POA: Diagnosis not present

## 2023-03-27 DIAGNOSIS — E785 Hyperlipidemia, unspecified: Secondary | ICD-10-CM | POA: Diagnosis not present

## 2023-03-27 DIAGNOSIS — K219 Gastro-esophageal reflux disease without esophagitis: Secondary | ICD-10-CM | POA: Diagnosis not present

## 2023-03-27 DIAGNOSIS — G8929 Other chronic pain: Secondary | ICD-10-CM | POA: Diagnosis not present

## 2023-03-27 DIAGNOSIS — M810 Age-related osteoporosis without current pathological fracture: Secondary | ICD-10-CM | POA: Diagnosis not present

## 2023-03-27 DIAGNOSIS — Z7901 Long term (current) use of anticoagulants: Secondary | ICD-10-CM | POA: Diagnosis not present

## 2023-03-27 DIAGNOSIS — I10 Essential (primary) hypertension: Secondary | ICD-10-CM | POA: Diagnosis not present

## 2023-03-27 DIAGNOSIS — Z471 Aftercare following joint replacement surgery: Secondary | ICD-10-CM | POA: Diagnosis not present

## 2023-04-06 DIAGNOSIS — Z79899 Other long term (current) drug therapy: Secondary | ICD-10-CM | POA: Diagnosis not present

## 2023-04-06 DIAGNOSIS — Z471 Aftercare following joint replacement surgery: Secondary | ICD-10-CM | POA: Diagnosis not present

## 2023-04-06 DIAGNOSIS — M810 Age-related osteoporosis without current pathological fracture: Secondary | ICD-10-CM | POA: Diagnosis not present

## 2023-04-06 DIAGNOSIS — E785 Hyperlipidemia, unspecified: Secondary | ICD-10-CM | POA: Diagnosis not present

## 2023-04-06 DIAGNOSIS — Z96652 Presence of left artificial knee joint: Secondary | ICD-10-CM | POA: Diagnosis not present

## 2023-04-06 DIAGNOSIS — Z7901 Long term (current) use of anticoagulants: Secondary | ICD-10-CM | POA: Diagnosis not present

## 2023-04-06 DIAGNOSIS — I10 Essential (primary) hypertension: Secondary | ICD-10-CM | POA: Diagnosis not present

## 2023-04-06 DIAGNOSIS — G8929 Other chronic pain: Secondary | ICD-10-CM | POA: Diagnosis not present

## 2023-04-06 DIAGNOSIS — Z79891 Long term (current) use of opiate analgesic: Secondary | ICD-10-CM | POA: Diagnosis not present

## 2023-04-06 DIAGNOSIS — Z96619 Presence of unspecified artificial shoulder joint: Secondary | ICD-10-CM | POA: Diagnosis not present

## 2023-04-06 DIAGNOSIS — J439 Emphysema, unspecified: Secondary | ICD-10-CM | POA: Diagnosis not present

## 2023-04-06 DIAGNOSIS — K449 Diaphragmatic hernia without obstruction or gangrene: Secondary | ICD-10-CM | POA: Diagnosis not present

## 2023-04-06 DIAGNOSIS — K219 Gastro-esophageal reflux disease without esophagitis: Secondary | ICD-10-CM | POA: Diagnosis not present

## 2023-04-11 DIAGNOSIS — Z96652 Presence of left artificial knee joint: Secondary | ICD-10-CM | POA: Diagnosis not present

## 2023-04-11 DIAGNOSIS — K219 Gastro-esophageal reflux disease without esophagitis: Secondary | ICD-10-CM | POA: Diagnosis not present

## 2023-04-11 DIAGNOSIS — Z96619 Presence of unspecified artificial shoulder joint: Secondary | ICD-10-CM | POA: Diagnosis not present

## 2023-04-11 DIAGNOSIS — Z79891 Long term (current) use of opiate analgesic: Secondary | ICD-10-CM | POA: Diagnosis not present

## 2023-04-11 DIAGNOSIS — M810 Age-related osteoporosis without current pathological fracture: Secondary | ICD-10-CM | POA: Diagnosis not present

## 2023-04-11 DIAGNOSIS — G8929 Other chronic pain: Secondary | ICD-10-CM | POA: Diagnosis not present

## 2023-04-11 DIAGNOSIS — Z79899 Other long term (current) drug therapy: Secondary | ICD-10-CM | POA: Diagnosis not present

## 2023-04-11 DIAGNOSIS — K449 Diaphragmatic hernia without obstruction or gangrene: Secondary | ICD-10-CM | POA: Diagnosis not present

## 2023-04-11 DIAGNOSIS — Z7901 Long term (current) use of anticoagulants: Secondary | ICD-10-CM | POA: Diagnosis not present

## 2023-04-11 DIAGNOSIS — I10 Essential (primary) hypertension: Secondary | ICD-10-CM | POA: Diagnosis not present

## 2023-04-11 DIAGNOSIS — J439 Emphysema, unspecified: Secondary | ICD-10-CM | POA: Diagnosis not present

## 2023-04-11 DIAGNOSIS — Z471 Aftercare following joint replacement surgery: Secondary | ICD-10-CM | POA: Diagnosis not present

## 2023-04-11 DIAGNOSIS — E785 Hyperlipidemia, unspecified: Secondary | ICD-10-CM | POA: Diagnosis not present

## 2023-04-19 DIAGNOSIS — Z471 Aftercare following joint replacement surgery: Secondary | ICD-10-CM | POA: Diagnosis not present

## 2023-04-19 DIAGNOSIS — E785 Hyperlipidemia, unspecified: Secondary | ICD-10-CM | POA: Diagnosis not present

## 2023-04-19 DIAGNOSIS — K219 Gastro-esophageal reflux disease without esophagitis: Secondary | ICD-10-CM | POA: Diagnosis not present

## 2023-04-19 DIAGNOSIS — J439 Emphysema, unspecified: Secondary | ICD-10-CM | POA: Diagnosis not present

## 2023-04-19 DIAGNOSIS — I10 Essential (primary) hypertension: Secondary | ICD-10-CM | POA: Diagnosis not present

## 2023-04-19 DIAGNOSIS — Z96652 Presence of left artificial knee joint: Secondary | ICD-10-CM | POA: Diagnosis not present

## 2023-04-19 DIAGNOSIS — Z96619 Presence of unspecified artificial shoulder joint: Secondary | ICD-10-CM | POA: Diagnosis not present

## 2023-04-19 DIAGNOSIS — Z7901 Long term (current) use of anticoagulants: Secondary | ICD-10-CM | POA: Diagnosis not present

## 2023-04-19 DIAGNOSIS — Z79899 Other long term (current) drug therapy: Secondary | ICD-10-CM | POA: Diagnosis not present

## 2023-04-19 DIAGNOSIS — K449 Diaphragmatic hernia without obstruction or gangrene: Secondary | ICD-10-CM | POA: Diagnosis not present

## 2023-04-19 DIAGNOSIS — G8929 Other chronic pain: Secondary | ICD-10-CM | POA: Diagnosis not present

## 2023-04-19 DIAGNOSIS — M810 Age-related osteoporosis without current pathological fracture: Secondary | ICD-10-CM | POA: Diagnosis not present

## 2023-04-19 DIAGNOSIS — Z79891 Long term (current) use of opiate analgesic: Secondary | ICD-10-CM | POA: Diagnosis not present

## 2023-04-24 DIAGNOSIS — Z79899 Other long term (current) drug therapy: Secondary | ICD-10-CM | POA: Diagnosis not present

## 2023-04-24 DIAGNOSIS — Z96652 Presence of left artificial knee joint: Secondary | ICD-10-CM | POA: Diagnosis not present

## 2023-04-24 DIAGNOSIS — J439 Emphysema, unspecified: Secondary | ICD-10-CM | POA: Diagnosis not present

## 2023-04-24 DIAGNOSIS — M810 Age-related osteoporosis without current pathological fracture: Secondary | ICD-10-CM | POA: Diagnosis not present

## 2023-04-24 DIAGNOSIS — I10 Essential (primary) hypertension: Secondary | ICD-10-CM | POA: Diagnosis not present

## 2023-04-24 DIAGNOSIS — K219 Gastro-esophageal reflux disease without esophagitis: Secondary | ICD-10-CM | POA: Diagnosis not present

## 2023-04-24 DIAGNOSIS — E785 Hyperlipidemia, unspecified: Secondary | ICD-10-CM | POA: Diagnosis not present

## 2023-04-24 DIAGNOSIS — Z79891 Long term (current) use of opiate analgesic: Secondary | ICD-10-CM | POA: Diagnosis not present

## 2023-04-24 DIAGNOSIS — K449 Diaphragmatic hernia without obstruction or gangrene: Secondary | ICD-10-CM | POA: Diagnosis not present

## 2023-04-24 DIAGNOSIS — Z471 Aftercare following joint replacement surgery: Secondary | ICD-10-CM | POA: Diagnosis not present

## 2023-04-24 DIAGNOSIS — Z7901 Long term (current) use of anticoagulants: Secondary | ICD-10-CM | POA: Diagnosis not present

## 2023-04-24 DIAGNOSIS — G8929 Other chronic pain: Secondary | ICD-10-CM | POA: Diagnosis not present

## 2023-04-24 DIAGNOSIS — Z96619 Presence of unspecified artificial shoulder joint: Secondary | ICD-10-CM | POA: Diagnosis not present

## 2023-04-26 ENCOUNTER — Other Ambulatory Visit: Payer: Self-pay | Admitting: Family Medicine

## 2023-04-26 DIAGNOSIS — K219 Gastro-esophageal reflux disease without esophagitis: Secondary | ICD-10-CM

## 2023-04-26 NOTE — Telephone Encounter (Signed)
Requested Prescriptions  Pending Prescriptions Disp Refills   omeprazole (PRILOSEC) 20 MG capsule [Pharmacy Med Name: OMEPRAZOLE DR 20 MG CAPSULE] 90 capsule 1    Sig: TAKE 1 CAPSULE BY MOUTH EVERY DAY BEFORE BREAKFAST     Gastroenterology: Proton Pump Inhibitors Passed - 04/26/2023  2:33 AM      Passed - Valid encounter within last 12 months    Recent Outpatient Visits           1 month ago Actinic keratosis   [facility], Netta Neat, DO   2 months ago Herpes zoster without complication   [facility], Netta Neat, DO   4 months ago Acute cystitis without hematuria   [facility] Smitty Cords, DO   4 months ago Left hip pain   [facility] Smitty Cords, DO   6 months ago Pre-op evaluation   [facility], Netta Neat, [place]

## 2023-05-03 DIAGNOSIS — G8929 Other chronic pain: Secondary | ICD-10-CM | POA: Diagnosis not present

## 2023-05-03 DIAGNOSIS — K449 Diaphragmatic hernia without obstruction or gangrene: Secondary | ICD-10-CM | POA: Diagnosis not present

## 2023-05-03 DIAGNOSIS — Z79899 Other long term (current) drug therapy: Secondary | ICD-10-CM | POA: Diagnosis not present

## 2023-05-03 DIAGNOSIS — E785 Hyperlipidemia, unspecified: Secondary | ICD-10-CM | POA: Diagnosis not present

## 2023-05-03 DIAGNOSIS — K219 Gastro-esophageal reflux disease without esophagitis: Secondary | ICD-10-CM | POA: Diagnosis not present

## 2023-05-03 DIAGNOSIS — I10 Essential (primary) hypertension: Secondary | ICD-10-CM | POA: Diagnosis not present

## 2023-05-03 DIAGNOSIS — Z96652 Presence of left artificial knee joint: Secondary | ICD-10-CM | POA: Diagnosis not present

## 2023-05-03 DIAGNOSIS — M810 Age-related osteoporosis without current pathological fracture: Secondary | ICD-10-CM | POA: Diagnosis not present

## 2023-05-03 DIAGNOSIS — J439 Emphysema, unspecified: Secondary | ICD-10-CM | POA: Diagnosis not present

## 2023-05-03 DIAGNOSIS — Z471 Aftercare following joint replacement surgery: Secondary | ICD-10-CM | POA: Diagnosis not present

## 2023-05-03 DIAGNOSIS — Z96619 Presence of unspecified artificial shoulder joint: Secondary | ICD-10-CM | POA: Diagnosis not present

## 2023-05-03 DIAGNOSIS — Z7901 Long term (current) use of anticoagulants: Secondary | ICD-10-CM | POA: Diagnosis not present

## 2023-05-03 DIAGNOSIS — Z79891 Long term (current) use of opiate analgesic: Secondary | ICD-10-CM | POA: Diagnosis not present

## 2023-05-07 DIAGNOSIS — Z96652 Presence of left artificial knee joint: Secondary | ICD-10-CM | POA: Diagnosis not present

## 2023-05-07 DIAGNOSIS — Z79891 Long term (current) use of opiate analgesic: Secondary | ICD-10-CM | POA: Diagnosis not present

## 2023-05-07 DIAGNOSIS — J439 Emphysema, unspecified: Secondary | ICD-10-CM | POA: Diagnosis not present

## 2023-05-07 DIAGNOSIS — I10 Essential (primary) hypertension: Secondary | ICD-10-CM | POA: Diagnosis not present

## 2023-05-07 DIAGNOSIS — Z471 Aftercare following joint replacement surgery: Secondary | ICD-10-CM | POA: Diagnosis not present

## 2023-05-07 DIAGNOSIS — G8929 Other chronic pain: Secondary | ICD-10-CM | POA: Diagnosis not present

## 2023-05-07 DIAGNOSIS — K219 Gastro-esophageal reflux disease without esophagitis: Secondary | ICD-10-CM | POA: Diagnosis not present

## 2023-05-07 DIAGNOSIS — M810 Age-related osteoporosis without current pathological fracture: Secondary | ICD-10-CM | POA: Diagnosis not present

## 2023-05-07 DIAGNOSIS — Z96619 Presence of unspecified artificial shoulder joint: Secondary | ICD-10-CM | POA: Diagnosis not present

## 2023-05-07 DIAGNOSIS — Z7901 Long term (current) use of anticoagulants: Secondary | ICD-10-CM | POA: Diagnosis not present

## 2023-05-07 DIAGNOSIS — K449 Diaphragmatic hernia without obstruction or gangrene: Secondary | ICD-10-CM | POA: Diagnosis not present

## 2023-05-07 DIAGNOSIS — E785 Hyperlipidemia, unspecified: Secondary | ICD-10-CM | POA: Diagnosis not present

## 2023-05-07 DIAGNOSIS — Z79899 Other long term (current) drug therapy: Secondary | ICD-10-CM | POA: Diagnosis not present

## 2023-05-08 ENCOUNTER — Telehealth: Payer: Self-pay

## 2023-05-08 ENCOUNTER — Ambulatory Visit: Payer: Self-pay | Admitting: *Deleted

## 2023-05-08 NOTE — Telephone Encounter (Signed)
Copied from CRM [id number]. Topic: General - Inquiry >> May 08, 2023  9:01 AM Marlow Baars wrote: Reason for CRM: Pervis Hocking with [facility] called in to report the patient fell last week getting into her truck. She feel into the truck and not on the ground. She did complain of bi lateral knee pain. Darl Pikes is also requesting a list of all current medications be sent to her at fax number [phone]

## 2023-05-08 NOTE — Telephone Encounter (Signed)
Called and left a detail message on Pamela Ball voicemail with Dr. Charm Rings recommendation.

## 2023-05-08 NOTE — Telephone Encounter (Signed)
Okay. She has had complicated course with orthopedics for her knees and joints. I would suggest if she feels fine and no major issues okay to monitor, if needed they should keep her orthopedic updated.  Okay to send them the med list as requested  [name], DO [facility] 05/08/2023, 1:15 PM

## 2023-05-08 NOTE — Telephone Encounter (Signed)
Summary: Swelling in knee   Pt has swelling in her knee, seeking appt husband on DPR denies knee swellling      Chief Complaint: "bump" left leg Symptoms: "bump" left leg between knee and bottom of leg. Can walk. Frequency: 2 weeks  Pertinent Negatives: Patient denies leg swelling no fever no drainage or redness of bump or leg Disposition: [] ED /[] Urgent Care (no appt availability in office) / [x] Appointment(In office/virtual)/ []  [place] [facility]/ [] Home Care/ [] Refused Recommended Disposition /[] [place] [facility]/ []  Follow-up with PCP Additional Notes:   Appt scheduled 05/16/23. Requesting earlier appt on wait list .     Reason for Disposition  [1] Pain in front of the lower leg(s) (shins) AND [2] occurs with running or jumping exercise (e.g., jogging,  basketball)  Answer Assessment - Initial Assessment Questions 1. ONSET: "When did the pain start?"      2 weeks ago  2. LOCATION: "Where is the pain located?"     "Bump" between knee and bottom of left leg 3. PAIN: "How bad is the pain?"    (Scale 1-10; or mild, moderate, severe)   -  MILD (1-3): doesn't interfere with normal activities    -  MODERATE (4-7): interferes with normal activities (e.g., work or school) or awakens from sleep, limping    -  SEVERE (8-10): excruciating pain, unable to do any normal activities, unable to walk     Yes  4. WORK OR EXERCISE: "Has there been any recent work or exercise that involved this part of the body?"      Recent surgery per husband on DPR 5. CAUSE: "What do you think is causing the leg pain?"    Not sure  6. OTHER SYMPTOMS: "Do you have any other symptoms?" (e.g., chest pain, back pain, breathing difficulty, swelling, rash, fever, numbness, weakness)     Can walk. "Bump" noted left leg between left knee and bottom of leg shin area no redness  7. PREGNANCY: "Is there any chance you are pregnant?" "When was your last menstrual period?"     na  Protocols used: Leg  Pain-A-AH

## 2023-05-14 DIAGNOSIS — I10 Essential (primary) hypertension: Secondary | ICD-10-CM | POA: Diagnosis not present

## 2023-05-14 DIAGNOSIS — K449 Diaphragmatic hernia without obstruction or gangrene: Secondary | ICD-10-CM | POA: Diagnosis not present

## 2023-05-14 DIAGNOSIS — Z79891 Long term (current) use of opiate analgesic: Secondary | ICD-10-CM | POA: Diagnosis not present

## 2023-05-14 DIAGNOSIS — K219 Gastro-esophageal reflux disease without esophagitis: Secondary | ICD-10-CM | POA: Diagnosis not present

## 2023-05-14 DIAGNOSIS — M810 Age-related osteoporosis without current pathological fracture: Secondary | ICD-10-CM | POA: Diagnosis not present

## 2023-05-14 DIAGNOSIS — E785 Hyperlipidemia, unspecified: Secondary | ICD-10-CM | POA: Diagnosis not present

## 2023-05-14 DIAGNOSIS — Z96652 Presence of left artificial knee joint: Secondary | ICD-10-CM | POA: Diagnosis not present

## 2023-05-14 DIAGNOSIS — Z96619 Presence of unspecified artificial shoulder joint: Secondary | ICD-10-CM | POA: Diagnosis not present

## 2023-05-14 DIAGNOSIS — Z79899 Other long term (current) drug therapy: Secondary | ICD-10-CM | POA: Diagnosis not present

## 2023-05-14 DIAGNOSIS — Z7901 Long term (current) use of anticoagulants: Secondary | ICD-10-CM | POA: Diagnosis not present

## 2023-05-14 DIAGNOSIS — J439 Emphysema, unspecified: Secondary | ICD-10-CM | POA: Diagnosis not present

## 2023-05-14 DIAGNOSIS — Z471 Aftercare following joint replacement surgery: Secondary | ICD-10-CM | POA: Diagnosis not present

## 2023-05-14 DIAGNOSIS — G8929 Other chronic pain: Secondary | ICD-10-CM | POA: Diagnosis not present

## 2023-05-15 ENCOUNTER — Telehealth: Payer: Self-pay

## 2023-05-15 NOTE — Telephone Encounter (Signed)
Okay to proceed w/ HH orders.  Also agree with bubble pack if they can arrange it or do they need [place] to arrange bubble packs? I would usually link up our clinical pharmacist to help with arranging this if needed.  [name], DO [facility] 05/15/2023, 12:50 PM

## 2023-05-15 NOTE — Telephone Encounter (Signed)
Verbal order left on the voicemail per Dr. Kirtland Bouchard recommendation.

## 2023-05-15 NOTE — Telephone Encounter (Signed)
Copied from CRM [id number]. Topic: Quick Communication - Home Health Verbal Orders >> May 15, 2023  8:28 AM Everette C wrote: Caller/Agency: Pervis Hocking / [facility]  Callback Number:  [phone] Requesting OT/PT/Skilled Nursing/Social Work/Speech Therapy: PT Frequency: 2w4 1w5  Patient is struggling with medications and Darl Pikes would like to further discuss bubble packs for the patient or pre-measured medications

## 2023-05-16 ENCOUNTER — Encounter: Payer: Self-pay | Admitting: Family Medicine

## 2023-05-16 ENCOUNTER — Ambulatory Visit (INDEPENDENT_AMBULATORY_CARE_PROVIDER_SITE_OTHER): Payer: Medicare Other | Admitting: Family Medicine

## 2023-05-16 VITALS — BP 158/60 | HR 84 | Temp 98.5°F | Resp 17 | Ht <= 58 in | Wt 95.0 lb

## 2023-05-16 DIAGNOSIS — R531 Weakness: Secondary | ICD-10-CM

## 2023-05-16 DIAGNOSIS — L858 Other specified epidermal thickening: Secondary | ICD-10-CM

## 2023-05-16 DIAGNOSIS — M170 Bilateral primary osteoarthritis of knee: Secondary | ICD-10-CM

## 2023-05-16 NOTE — Progress Notes (Addendum)
Subjective:    Patient ID: Pamela Ball, female    DOB: September 11, 1946, 77 y.o.   MRN: [id number]  Pamela Ball is a 77 y.o. female presenting on 05/16/2023 for Cyst (2cm cyst on the Left anterior shin x 1 mth . Red inflamed and scabbed over. )   HPI  Skin Growth Lesion Left Leg Reports new skin growth larger with a scab like core, onset >1 month ago, it is painful, not bleeding or oozing or draining. No other skin lesion similar. No personal history of skin cancer.  Requesting dermatology for excision.  Bilateral Knee Osteoarthritis Generalized Weakness Chronic problem with knee pain arthritis limiting her mobility. She has history of hip and shoulder fractures in past. History of osteoporosis and recurrent falls. She has wheelchair but would benefit from a smaller size youth / juvenile walker to help with mobility situational if needs to use walker with assistance for ambulation.  Past Medical History:  Diagnosis Date   Abdominal hernia with obstruction and without gangrene    Anxiety 02/15/2017   denies   Asthma    Bipolar 1 disorder, depressed, full remission (HCC) 02/15/2017   Bipolar affective disorder (HCC)    COPD (chronic obstructive pulmonary disease) (HCC)    Depression    GERD (gastroesophageal reflux disease) 02/15/2017   Glaucoma    Headache    Hiatal hernia    History of abdominal hernia 05/21/2017   History of compression fracture of spine 02/15/2017   Hyperlipidemia    Hypertension    Incisional hernia    Incisional ventral hernia w obstruction 06/02/2017   Normocytic anemia 05/23/2017   Osteoarthritis of knees, 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   Presbycusis of both ears 02/15/2017         03/15/2023    9:33 AM 02/01/2023    8:51 AM 10/19/2022   10:39 AM  Depression screen PHQ 2/9  Decreased Interest 0 0 0  Down, Depressed, Hopeless 0 0 0  PHQ - 2 Score 0 0 0  Altered sleeping  0 3  Tired, decreased energy  0 0  Change in appetite  0 3  Feeling bad or failure  about yourself   0 0  Trouble concentrating  0 0  Moving slowly or fidgety/restless  0 0  Suicidal thoughts  0 0  PHQ-9 Score  0 6  Difficult doing work/chores  Not difficult at all Not difficult at all    Social History   Tobacco Use   Smoking status: Never   Smokeless tobacco: Never  Vaping Use   Vaping status: Never Used  Substance Use Topics   Alcohol use: No    Comment: drink occasionally    Drug use: No    Review of Systems Per HPI unless specifically indicated above     Objective:    BP (!) 158/60 (BP Location: Right Arm, Patient Position: Sitting)   Pulse 84   Temp 98.5 F (36.9 C) (Oral)   Resp 17   Ht 4\' 10"  (1.473 m)   Wt 95 lb (43.1 kg)   SpO2 96%   BMI 19.86 kg/m   Wt Readings from Last 3 Encounters:  05/16/23 95 lb (43.1 kg)  03/15/23 95 lb (43.1 kg)  02/01/23 100 lb 6.4 oz (45.5 kg)    Physical Exam Vitals and nursing note reviewed.  Constitutional:      General: She is not in acute distress.    Appearance: Normal appearance. She is well-developed. She is not diaphoretic.  Comments: Well-appearing, comfortable, cooperative  HENT:     Head: Normocephalic and atraumatic.  Eyes:     General:        Right eye: No discharge.        Left eye: No discharge.     Conjunctiva/sclera: Conjunctivae normal.  Cardiovascular:     Rate and Rhythm: Normal rate.  Pulmonary:     Effort: Pulmonary effort is normal.  Skin:    General: Skin is warm and dry.     Findings: Lesion (see photo, 1 x 1+ keratoacanthoma of Left lower leg anterior with keratin scab like material central core.) present. No erythema or rash.  Neurological:     Mental Status: She is alert and oriented to person, place, and time.  Psychiatric:        Mood and Affect: Mood normal.        Behavior: Behavior normal.        Thought Content: Thought content normal.     Comments: Well groomed, good eye contact, normal speech and thoughts     Left leg anterior    Results for orders  placed or performed in visit on 12/04/22  Urine Culture   Specimen: Urine  Result Value Ref Range   MICRO NUMBER: [id number]    SPECIMEN QUALITY: Adequate    Sample Source URINE, CLEAN CATCH    STATUS: FINAL    Result: No Growth   Urinalysis, Routine w reflex microscopic  Result Value Ref Range   Color, Urine YELLOW YELLOW   APPearance CLEAR CLEAR   Specific Gravity, Urine 1.012 1.001 - 1.035   pH 6.0 5.0 - 8.0   Glucose, UA NEGATIVE NEGATIVE   Bilirubin Urine NEGATIVE NEGATIVE   Ketones, ur NEGATIVE NEGATIVE   Hgb urine dipstick NEGATIVE NEGATIVE   Protein, ur NEGATIVE NEGATIVE   Nitrite NEGATIVE NEGATIVE   Leukocytes,Ua NEGATIVE NEGATIVE      Assessment & Plan:   Problem List Items Addressed This Visit     Osteoarthritis of knees, bilateral   Other Visit Diagnoses     Keratoacanthoma of lower leg    -  Primary   Relevant Orders   Ambulatory referral to Dermatology   Generalized weakness          New onset > 1 month, large >1 cm protruding Keratoacanthoma of anterior left lower leg Lesion is painful Concern for risk of SCC skin cancer cannot rule out, requesting excisional biopsy by Dermatology  Urgent referral placed.   Generalized Weakness Bilateral Knee Arthritis Chronic pain and limited mobility Usually requires wheelchair for transportation longer distances. She will need a youth sized / juvenile walker to assist with shorter distance mobility in the home and out of the home to avoid falls.   No orders of the defined types were placed in this encounter.     Follow up plan: Return if symptoms worsen or fail to improve.   [name], DO [facility] 05/16/2023, 10:46 AM

## 2023-05-16 NOTE — Patient Instructions (Addendum)
Thank you for coming to the office today.  The skin growth look like a Keratoacanthoma - it has a thicker core of material within it. It may bleed and can be painful.  This is usually non cancerous, but it has a chance or risk to become Squamous Cell Carcinoma cancer.  We will refer you to Dermatologist in [place].  Soonest is in November 2024. If they get a cancellation they may call sooner  [facility] [place] Address: [address] [place], [place] [address] Phone: [phone]  We are also checking into [facility] - if they can get you in sooner, you will get a phone call.  [facility] [address] [place], [place] [address] Phone: [phone]   Please schedule a Follow-up Appointment to: Return if symptoms worsen or fail to improve.  If you have any other questions or concerns, please feel free to call the office or send a message through MyChart. You may also schedule an earlier appointment if necessary.  Additionally, you may be receiving a survey about your experience at our office within a few days to 1 week by e-mail or mail. We value your feedback.  [name], DO [facility], [place]

## 2023-05-18 ENCOUNTER — Other Ambulatory Visit: Payer: Self-pay | Admitting: Family Medicine

## 2023-05-18 DIAGNOSIS — C44729 Squamous cell carcinoma of skin of left lower limb, including hip: Secondary | ICD-10-CM | POA: Diagnosis not present

## 2023-05-18 DIAGNOSIS — D485 Neoplasm of uncertain behavior of skin: Secondary | ICD-10-CM | POA: Diagnosis not present

## 2023-05-18 DIAGNOSIS — I10 Essential (primary) hypertension: Secondary | ICD-10-CM

## 2023-05-18 DIAGNOSIS — R208 Other disturbances of skin sensation: Secondary | ICD-10-CM | POA: Diagnosis not present

## 2023-05-18 NOTE — Telephone Encounter (Signed)
Requested medication (s) are due for refill today: yes  Requested medication (s) are on the active medication list: yes  Last refill:  12/26/22 #90  Future visit scheduled:no  Notes to clinic:  has had multiple visits but not addressed BP/med   Requested Prescriptions  Pending Prescriptions Disp Refills   amLODipine (NORVASC) 10 MG tablet [Pharmacy Med Name: AMLODIPINE BESYLATE 10 MG TAB] 90 tablet 0    Sig: TAKE 1 TABLET BY MOUTH EVERY DAY     Cardiovascular: Calcium Channel Blockers 2 Failed - 05/18/2023 12:09 PM      Failed - Last BP in normal range    BP Readings from Last 1 Encounters:  05/16/23 (!) 158/60         Passed - Last Heart Rate in normal range    Pulse Readings from Last 1 Encounters:  05/16/23 84         Passed - Valid encounter within last 6 months    Recent Outpatient Visits           2 days ago Keratoacanthoma of lower leg   [facility] Smitty Cords, DO   2 months ago Actinic keratosis   [facility], Netta Neat, DO   3 months ago Herpes zoster without complication   [facility] [place], Netta Neat, DO   5 months ago Acute cystitis without hematuria   [facility] Smitty Cords, DO   5 months ago Left hip pain   [facility], Netta Neat, [place]

## 2023-05-23 DIAGNOSIS — G8929 Other chronic pain: Secondary | ICD-10-CM | POA: Diagnosis not present

## 2023-05-23 DIAGNOSIS — Z96619 Presence of unspecified artificial shoulder joint: Secondary | ICD-10-CM | POA: Diagnosis not present

## 2023-05-23 DIAGNOSIS — M810 Age-related osteoporosis without current pathological fracture: Secondary | ICD-10-CM | POA: Diagnosis not present

## 2023-05-23 DIAGNOSIS — Z471 Aftercare following joint replacement surgery: Secondary | ICD-10-CM | POA: Diagnosis not present

## 2023-05-23 DIAGNOSIS — K219 Gastro-esophageal reflux disease without esophagitis: Secondary | ICD-10-CM | POA: Diagnosis not present

## 2023-05-23 DIAGNOSIS — Z79899 Other long term (current) drug therapy: Secondary | ICD-10-CM | POA: Diagnosis not present

## 2023-05-23 DIAGNOSIS — J439 Emphysema, unspecified: Secondary | ICD-10-CM | POA: Diagnosis not present

## 2023-05-23 DIAGNOSIS — K449 Diaphragmatic hernia without obstruction or gangrene: Secondary | ICD-10-CM | POA: Diagnosis not present

## 2023-05-23 DIAGNOSIS — Z79891 Long term (current) use of opiate analgesic: Secondary | ICD-10-CM | POA: Diagnosis not present

## 2023-05-23 DIAGNOSIS — Z96652 Presence of left artificial knee joint: Secondary | ICD-10-CM | POA: Diagnosis not present

## 2023-05-23 DIAGNOSIS — Z7901 Long term (current) use of anticoagulants: Secondary | ICD-10-CM | POA: Diagnosis not present

## 2023-05-23 DIAGNOSIS — I10 Essential (primary) hypertension: Secondary | ICD-10-CM | POA: Diagnosis not present

## 2023-05-23 DIAGNOSIS — E785 Hyperlipidemia, unspecified: Secondary | ICD-10-CM | POA: Diagnosis not present

## 2023-05-30 DIAGNOSIS — G8929 Other chronic pain: Secondary | ICD-10-CM | POA: Diagnosis not present

## 2023-05-30 DIAGNOSIS — Z7901 Long term (current) use of anticoagulants: Secondary | ICD-10-CM | POA: Diagnosis not present

## 2023-05-30 DIAGNOSIS — Z471 Aftercare following joint replacement surgery: Secondary | ICD-10-CM | POA: Diagnosis not present

## 2023-05-30 DIAGNOSIS — J439 Emphysema, unspecified: Secondary | ICD-10-CM | POA: Diagnosis not present

## 2023-05-30 DIAGNOSIS — I10 Essential (primary) hypertension: Secondary | ICD-10-CM | POA: Diagnosis not present

## 2023-05-30 DIAGNOSIS — E785 Hyperlipidemia, unspecified: Secondary | ICD-10-CM | POA: Diagnosis not present

## 2023-05-30 DIAGNOSIS — Z79899 Other long term (current) drug therapy: Secondary | ICD-10-CM | POA: Diagnosis not present

## 2023-05-30 DIAGNOSIS — Z96619 Presence of unspecified artificial shoulder joint: Secondary | ICD-10-CM | POA: Diagnosis not present

## 2023-05-30 DIAGNOSIS — K449 Diaphragmatic hernia without obstruction or gangrene: Secondary | ICD-10-CM | POA: Diagnosis not present

## 2023-05-30 DIAGNOSIS — K219 Gastro-esophageal reflux disease without esophagitis: Secondary | ICD-10-CM | POA: Diagnosis not present

## 2023-05-30 DIAGNOSIS — Z96652 Presence of left artificial knee joint: Secondary | ICD-10-CM | POA: Diagnosis not present

## 2023-05-30 DIAGNOSIS — M810 Age-related osteoporosis without current pathological fracture: Secondary | ICD-10-CM | POA: Diagnosis not present

## 2023-05-30 DIAGNOSIS — Z79891 Long term (current) use of opiate analgesic: Secondary | ICD-10-CM | POA: Diagnosis not present

## 2023-06-04 DIAGNOSIS — M810 Age-related osteoporosis without current pathological fracture: Secondary | ICD-10-CM | POA: Diagnosis not present

## 2023-06-04 DIAGNOSIS — Z96619 Presence of unspecified artificial shoulder joint: Secondary | ICD-10-CM | POA: Diagnosis not present

## 2023-06-04 DIAGNOSIS — K449 Diaphragmatic hernia without obstruction or gangrene: Secondary | ICD-10-CM | POA: Diagnosis not present

## 2023-06-04 DIAGNOSIS — Z96652 Presence of left artificial knee joint: Secondary | ICD-10-CM | POA: Diagnosis not present

## 2023-06-04 DIAGNOSIS — Z79899 Other long term (current) drug therapy: Secondary | ICD-10-CM | POA: Diagnosis not present

## 2023-06-04 DIAGNOSIS — J439 Emphysema, unspecified: Secondary | ICD-10-CM | POA: Diagnosis not present

## 2023-06-04 DIAGNOSIS — Z79891 Long term (current) use of opiate analgesic: Secondary | ICD-10-CM | POA: Diagnosis not present

## 2023-06-04 DIAGNOSIS — E785 Hyperlipidemia, unspecified: Secondary | ICD-10-CM | POA: Diagnosis not present

## 2023-06-04 DIAGNOSIS — I10 Essential (primary) hypertension: Secondary | ICD-10-CM | POA: Diagnosis not present

## 2023-06-04 DIAGNOSIS — Z7901 Long term (current) use of anticoagulants: Secondary | ICD-10-CM | POA: Diagnosis not present

## 2023-06-04 DIAGNOSIS — Z471 Aftercare following joint replacement surgery: Secondary | ICD-10-CM | POA: Diagnosis not present

## 2023-06-04 DIAGNOSIS — K219 Gastro-esophageal reflux disease without esophagitis: Secondary | ICD-10-CM | POA: Diagnosis not present

## 2023-06-04 DIAGNOSIS — G8929 Other chronic pain: Secondary | ICD-10-CM | POA: Diagnosis not present

## 2023-06-05 ENCOUNTER — Telehealth: Payer: Self-pay

## 2023-06-05 NOTE — Telephone Encounter (Signed)
Copied from CRM [id number]. Topic: General - Other >> Jun 04, 2023  1:54 PM Everette C wrote: Reason for CRM: Pamela Ball with [facility] has called to follow up on previous orders for a short adult rolling walker   Darl Pikes shares that the first request for the order was made more than 1 month ago   Please contact further when possible to discuss DME ordering >> Jun 04, 2023  2:08 PM Dondra Prader E wrote: Says she formerly sent these orders to Ortho and not [facility]. She apologizes for the confusion.

## 2023-06-06 ENCOUNTER — Telehealth: Payer: Self-pay | Admitting: Family Medicine

## 2023-06-06 DIAGNOSIS — E785 Hyperlipidemia, unspecified: Secondary | ICD-10-CM | POA: Diagnosis not present

## 2023-06-06 DIAGNOSIS — J439 Emphysema, unspecified: Secondary | ICD-10-CM | POA: Diagnosis not present

## 2023-06-06 DIAGNOSIS — K449 Diaphragmatic hernia without obstruction or gangrene: Secondary | ICD-10-CM | POA: Diagnosis not present

## 2023-06-06 DIAGNOSIS — Z96652 Presence of left artificial knee joint: Secondary | ICD-10-CM | POA: Diagnosis not present

## 2023-06-06 DIAGNOSIS — Z79891 Long term (current) use of opiate analgesic: Secondary | ICD-10-CM | POA: Diagnosis not present

## 2023-06-06 DIAGNOSIS — Z7901 Long term (current) use of anticoagulants: Secondary | ICD-10-CM | POA: Diagnosis not present

## 2023-06-06 DIAGNOSIS — K219 Gastro-esophageal reflux disease without esophagitis: Secondary | ICD-10-CM | POA: Diagnosis not present

## 2023-06-06 DIAGNOSIS — G8929 Other chronic pain: Secondary | ICD-10-CM | POA: Diagnosis not present

## 2023-06-06 DIAGNOSIS — Z96619 Presence of unspecified artificial shoulder joint: Secondary | ICD-10-CM | POA: Diagnosis not present

## 2023-06-06 DIAGNOSIS — Z471 Aftercare following joint replacement surgery: Secondary | ICD-10-CM | POA: Diagnosis not present

## 2023-06-06 DIAGNOSIS — I10 Essential (primary) hypertension: Secondary | ICD-10-CM | POA: Diagnosis not present

## 2023-06-06 DIAGNOSIS — M810 Age-related osteoporosis without current pathological fracture: Secondary | ICD-10-CM | POA: Diagnosis not present

## 2023-06-06 DIAGNOSIS — Z79899 Other long term (current) drug therapy: Secondary | ICD-10-CM | POA: Diagnosis not present

## 2023-06-06 NOTE — Telephone Encounter (Signed)
A nurse from [facility] is calling in to report that per pt, she fell out of the bed around 2 am Monday morning and hit her head on the floor. She had someone there to help her up. Nurse says she wanted to make pt's pcp aware of the situation.

## 2023-06-07 NOTE — Telephone Encounter (Signed)
Acknowledged.  [name], DO [facility] 06/07/2023, 1:12 PM

## 2023-06-11 DIAGNOSIS — Z471 Aftercare following joint replacement surgery: Secondary | ICD-10-CM | POA: Diagnosis not present

## 2023-06-11 DIAGNOSIS — M810 Age-related osteoporosis without current pathological fracture: Secondary | ICD-10-CM | POA: Diagnosis not present

## 2023-06-11 DIAGNOSIS — Z96652 Presence of left artificial knee joint: Secondary | ICD-10-CM | POA: Diagnosis not present

## 2023-06-11 DIAGNOSIS — G8929 Other chronic pain: Secondary | ICD-10-CM | POA: Diagnosis not present

## 2023-06-11 DIAGNOSIS — K219 Gastro-esophageal reflux disease without esophagitis: Secondary | ICD-10-CM | POA: Diagnosis not present

## 2023-06-11 DIAGNOSIS — I10 Essential (primary) hypertension: Secondary | ICD-10-CM | POA: Diagnosis not present

## 2023-06-11 DIAGNOSIS — Z7901 Long term (current) use of anticoagulants: Secondary | ICD-10-CM | POA: Diagnosis not present

## 2023-06-11 DIAGNOSIS — J439 Emphysema, unspecified: Secondary | ICD-10-CM | POA: Diagnosis not present

## 2023-06-11 DIAGNOSIS — Z96619 Presence of unspecified artificial shoulder joint: Secondary | ICD-10-CM | POA: Diagnosis not present

## 2023-06-11 DIAGNOSIS — Z79891 Long term (current) use of opiate analgesic: Secondary | ICD-10-CM | POA: Diagnosis not present

## 2023-06-11 DIAGNOSIS — K449 Diaphragmatic hernia without obstruction or gangrene: Secondary | ICD-10-CM | POA: Diagnosis not present

## 2023-06-11 DIAGNOSIS — E785 Hyperlipidemia, unspecified: Secondary | ICD-10-CM | POA: Diagnosis not present

## 2023-06-11 DIAGNOSIS — Z79899 Other long term (current) drug therapy: Secondary | ICD-10-CM | POA: Diagnosis not present

## 2023-06-13 DIAGNOSIS — Z7901 Long term (current) use of anticoagulants: Secondary | ICD-10-CM | POA: Diagnosis not present

## 2023-06-13 DIAGNOSIS — Z96652 Presence of left artificial knee joint: Secondary | ICD-10-CM | POA: Diagnosis not present

## 2023-06-13 DIAGNOSIS — Z79899 Other long term (current) drug therapy: Secondary | ICD-10-CM | POA: Diagnosis not present

## 2023-06-13 DIAGNOSIS — I10 Essential (primary) hypertension: Secondary | ICD-10-CM | POA: Diagnosis not present

## 2023-06-13 DIAGNOSIS — G8929 Other chronic pain: Secondary | ICD-10-CM | POA: Diagnosis not present

## 2023-06-13 DIAGNOSIS — J439 Emphysema, unspecified: Secondary | ICD-10-CM | POA: Diagnosis not present

## 2023-06-13 DIAGNOSIS — Z96619 Presence of unspecified artificial shoulder joint: Secondary | ICD-10-CM | POA: Diagnosis not present

## 2023-06-13 DIAGNOSIS — M810 Age-related osteoporosis without current pathological fracture: Secondary | ICD-10-CM | POA: Diagnosis not present

## 2023-06-13 DIAGNOSIS — K219 Gastro-esophageal reflux disease without esophagitis: Secondary | ICD-10-CM | POA: Diagnosis not present

## 2023-06-13 DIAGNOSIS — E785 Hyperlipidemia, unspecified: Secondary | ICD-10-CM | POA: Diagnosis not present

## 2023-06-13 DIAGNOSIS — K449 Diaphragmatic hernia without obstruction or gangrene: Secondary | ICD-10-CM | POA: Diagnosis not present

## 2023-06-13 DIAGNOSIS — Z471 Aftercare following joint replacement surgery: Secondary | ICD-10-CM | POA: Diagnosis not present

## 2023-06-13 DIAGNOSIS — Z79891 Long term (current) use of opiate analgesic: Secondary | ICD-10-CM | POA: Diagnosis not present

## 2023-06-21 DIAGNOSIS — Z471 Aftercare following joint replacement surgery: Secondary | ICD-10-CM | POA: Diagnosis not present

## 2023-06-21 DIAGNOSIS — I10 Essential (primary) hypertension: Secondary | ICD-10-CM | POA: Diagnosis not present

## 2023-06-21 DIAGNOSIS — G8929 Other chronic pain: Secondary | ICD-10-CM | POA: Diagnosis not present

## 2023-06-21 DIAGNOSIS — M810 Age-related osteoporosis without current pathological fracture: Secondary | ICD-10-CM | POA: Diagnosis not present

## 2023-06-21 DIAGNOSIS — K219 Gastro-esophageal reflux disease without esophagitis: Secondary | ICD-10-CM | POA: Diagnosis not present

## 2023-06-21 DIAGNOSIS — E785 Hyperlipidemia, unspecified: Secondary | ICD-10-CM | POA: Diagnosis not present

## 2023-06-21 DIAGNOSIS — Z96619 Presence of unspecified artificial shoulder joint: Secondary | ICD-10-CM | POA: Diagnosis not present

## 2023-06-21 DIAGNOSIS — J439 Emphysema, unspecified: Secondary | ICD-10-CM | POA: Diagnosis not present

## 2023-06-21 DIAGNOSIS — Z79891 Long term (current) use of opiate analgesic: Secondary | ICD-10-CM | POA: Diagnosis not present

## 2023-06-21 DIAGNOSIS — Z96652 Presence of left artificial knee joint: Secondary | ICD-10-CM | POA: Diagnosis not present

## 2023-06-21 DIAGNOSIS — Z79899 Other long term (current) drug therapy: Secondary | ICD-10-CM | POA: Diagnosis not present

## 2023-06-21 DIAGNOSIS — Z7901 Long term (current) use of anticoagulants: Secondary | ICD-10-CM | POA: Diagnosis not present

## 2023-06-21 DIAGNOSIS — K449 Diaphragmatic hernia without obstruction or gangrene: Secondary | ICD-10-CM | POA: Diagnosis not present

## 2023-06-23 DIAGNOSIS — I10 Essential (primary) hypertension: Secondary | ICD-10-CM | POA: Diagnosis not present

## 2023-06-23 DIAGNOSIS — Z96619 Presence of unspecified artificial shoulder joint: Secondary | ICD-10-CM | POA: Diagnosis not present

## 2023-06-23 DIAGNOSIS — Z79891 Long term (current) use of opiate analgesic: Secondary | ICD-10-CM | POA: Diagnosis not present

## 2023-06-23 DIAGNOSIS — Z471 Aftercare following joint replacement surgery: Secondary | ICD-10-CM | POA: Diagnosis not present

## 2023-06-23 DIAGNOSIS — Z96652 Presence of left artificial knee joint: Secondary | ICD-10-CM | POA: Diagnosis not present

## 2023-06-23 DIAGNOSIS — E785 Hyperlipidemia, unspecified: Secondary | ICD-10-CM | POA: Diagnosis not present

## 2023-06-23 DIAGNOSIS — G8929 Other chronic pain: Secondary | ICD-10-CM | POA: Diagnosis not present

## 2023-06-23 DIAGNOSIS — Z79899 Other long term (current) drug therapy: Secondary | ICD-10-CM | POA: Diagnosis not present

## 2023-06-23 DIAGNOSIS — M810 Age-related osteoporosis without current pathological fracture: Secondary | ICD-10-CM | POA: Diagnosis not present

## 2023-06-23 DIAGNOSIS — K449 Diaphragmatic hernia without obstruction or gangrene: Secondary | ICD-10-CM | POA: Diagnosis not present

## 2023-06-23 DIAGNOSIS — J439 Emphysema, unspecified: Secondary | ICD-10-CM | POA: Diagnosis not present

## 2023-06-23 DIAGNOSIS — K219 Gastro-esophageal reflux disease without esophagitis: Secondary | ICD-10-CM | POA: Diagnosis not present

## 2023-06-23 DIAGNOSIS — Z7901 Long term (current) use of anticoagulants: Secondary | ICD-10-CM | POA: Diagnosis not present

## 2023-06-28 ENCOUNTER — Telehealth: Payer: Self-pay

## 2023-06-28 DIAGNOSIS — R531 Weakness: Secondary | ICD-10-CM

## 2023-06-28 DIAGNOSIS — M170 Bilateral primary osteoarthritis of knee: Secondary | ICD-10-CM

## 2023-06-28 NOTE — Telephone Encounter (Signed)
Copied from CRM [id number]. Topic: General - Inquiry >> Jun 28, 2023 11:03 AM Haroldine Laws wrote: Reason for CRM: pt needs a excuse note for court tomorrow cecause she is taking her husband to radiation.  Pt needs this note before tomorrow  CB@  [phone] when the note is ready to pick up

## 2023-06-28 NOTE — Telephone Encounter (Signed)
Copied from CRM [id number]. Topic: General - Other >> Jun 28, 2023  9:32 AM Phill Myron wrote: Pervis Hocking with [facility] has stated she has called a couple weeks ago requesting that Pt. Slovacek receive a juvenile walker. Please advise and notate in chart.  Darl Pikes also is requesting a list of the patient's medication, due to the patient visual impairment and no assistance from her spouse due to his medical issues. She wanted to have her medication prepackaged.   Susan's #  [phone]  and the Fax# is [phone]

## 2023-06-29 ENCOUNTER — Encounter: Payer: Self-pay | Admitting: Family Medicine

## 2023-06-29 DIAGNOSIS — E785 Hyperlipidemia, unspecified: Secondary | ICD-10-CM | POA: Diagnosis not present

## 2023-06-29 DIAGNOSIS — M810 Age-related osteoporosis without current pathological fracture: Secondary | ICD-10-CM | POA: Diagnosis not present

## 2023-06-29 DIAGNOSIS — Z96619 Presence of unspecified artificial shoulder joint: Secondary | ICD-10-CM | POA: Diagnosis not present

## 2023-06-29 DIAGNOSIS — Z7901 Long term (current) use of anticoagulants: Secondary | ICD-10-CM | POA: Diagnosis not present

## 2023-06-29 DIAGNOSIS — Z79899 Other long term (current) drug therapy: Secondary | ICD-10-CM | POA: Diagnosis not present

## 2023-06-29 DIAGNOSIS — I10 Essential (primary) hypertension: Secondary | ICD-10-CM | POA: Diagnosis not present

## 2023-06-29 DIAGNOSIS — Z79891 Long term (current) use of opiate analgesic: Secondary | ICD-10-CM | POA: Diagnosis not present

## 2023-06-29 DIAGNOSIS — Z96652 Presence of left artificial knee joint: Secondary | ICD-10-CM | POA: Diagnosis not present

## 2023-06-29 DIAGNOSIS — G8929 Other chronic pain: Secondary | ICD-10-CM | POA: Diagnosis not present

## 2023-06-29 DIAGNOSIS — Z471 Aftercare following joint replacement surgery: Secondary | ICD-10-CM | POA: Diagnosis not present

## 2023-06-29 DIAGNOSIS — K449 Diaphragmatic hernia without obstruction or gangrene: Secondary | ICD-10-CM | POA: Diagnosis not present

## 2023-06-29 DIAGNOSIS — J439 Emphysema, unspecified: Secondary | ICD-10-CM | POA: Diagnosis not present

## 2023-06-29 DIAGNOSIS — K219 Gastro-esophageal reflux disease without esophagitis: Secondary | ICD-10-CM | POA: Diagnosis not present

## 2023-06-29 NOTE — Telephone Encounter (Signed)
Updated chart note with info on walker  Printed DME Juvenile / Youth walker  Printed last office visit note   Printed medication list  All ready to fax   Susan's #  [phone]  and the Fax# is [phone]   [name], DO [facility] 06/29/2023, 11:40 AM

## 2023-06-29 NOTE — Telephone Encounter (Signed)
Bobby advised.   Thanks,   -Vernona Rieger

## 2023-06-29 NOTE — Telephone Encounter (Signed)
Letter written. Signed. If you can contact her for pick up  [name], DO [facility] 06/29/2023, 8:17 AM

## 2023-07-02 DIAGNOSIS — E785 Hyperlipidemia, unspecified: Secondary | ICD-10-CM | POA: Diagnosis not present

## 2023-07-02 DIAGNOSIS — M810 Age-related osteoporosis without current pathological fracture: Secondary | ICD-10-CM | POA: Diagnosis not present

## 2023-07-02 DIAGNOSIS — Z79891 Long term (current) use of opiate analgesic: Secondary | ICD-10-CM | POA: Diagnosis not present

## 2023-07-02 DIAGNOSIS — G8929 Other chronic pain: Secondary | ICD-10-CM | POA: Diagnosis not present

## 2023-07-02 DIAGNOSIS — Z96652 Presence of left artificial knee joint: Secondary | ICD-10-CM | POA: Diagnosis not present

## 2023-07-02 DIAGNOSIS — J439 Emphysema, unspecified: Secondary | ICD-10-CM | POA: Diagnosis not present

## 2023-07-02 DIAGNOSIS — K219 Gastro-esophageal reflux disease without esophagitis: Secondary | ICD-10-CM | POA: Diagnosis not present

## 2023-07-02 DIAGNOSIS — Z471 Aftercare following joint replacement surgery: Secondary | ICD-10-CM | POA: Diagnosis not present

## 2023-07-02 DIAGNOSIS — Z96619 Presence of unspecified artificial shoulder joint: Secondary | ICD-10-CM | POA: Diagnosis not present

## 2023-07-02 DIAGNOSIS — Z79899 Other long term (current) drug therapy: Secondary | ICD-10-CM | POA: Diagnosis not present

## 2023-07-02 DIAGNOSIS — K449 Diaphragmatic hernia without obstruction or gangrene: Secondary | ICD-10-CM | POA: Diagnosis not present

## 2023-07-02 DIAGNOSIS — Z7901 Long term (current) use of anticoagulants: Secondary | ICD-10-CM | POA: Diagnosis not present

## 2023-07-02 DIAGNOSIS — I10 Essential (primary) hypertension: Secondary | ICD-10-CM | POA: Diagnosis not present

## 2023-07-11 DIAGNOSIS — E785 Hyperlipidemia, unspecified: Secondary | ICD-10-CM | POA: Diagnosis not present

## 2023-07-11 DIAGNOSIS — J439 Emphysema, unspecified: Secondary | ICD-10-CM | POA: Diagnosis not present

## 2023-07-11 DIAGNOSIS — Z471 Aftercare following joint replacement surgery: Secondary | ICD-10-CM | POA: Diagnosis not present

## 2023-07-11 DIAGNOSIS — I10 Essential (primary) hypertension: Secondary | ICD-10-CM | POA: Diagnosis not present

## 2023-07-11 DIAGNOSIS — G8929 Other chronic pain: Secondary | ICD-10-CM | POA: Diagnosis not present

## 2023-07-11 DIAGNOSIS — M810 Age-related osteoporosis without current pathological fracture: Secondary | ICD-10-CM | POA: Diagnosis not present

## 2023-07-11 DIAGNOSIS — K219 Gastro-esophageal reflux disease without esophagitis: Secondary | ICD-10-CM | POA: Diagnosis not present

## 2023-07-11 DIAGNOSIS — K449 Diaphragmatic hernia without obstruction or gangrene: Secondary | ICD-10-CM | POA: Diagnosis not present

## 2023-07-11 DIAGNOSIS — Z7901 Long term (current) use of anticoagulants: Secondary | ICD-10-CM | POA: Diagnosis not present

## 2023-07-11 DIAGNOSIS — Z96619 Presence of unspecified artificial shoulder joint: Secondary | ICD-10-CM | POA: Diagnosis not present

## 2023-07-11 DIAGNOSIS — Z96652 Presence of left artificial knee joint: Secondary | ICD-10-CM | POA: Diagnosis not present

## 2023-07-11 DIAGNOSIS — Z79891 Long term (current) use of opiate analgesic: Secondary | ICD-10-CM | POA: Diagnosis not present

## 2023-07-11 DIAGNOSIS — Z79899 Other long term (current) drug therapy: Secondary | ICD-10-CM | POA: Diagnosis not present

## 2023-07-12 ENCOUNTER — Other Ambulatory Visit: Payer: Self-pay | Admitting: Family Medicine

## 2023-07-12 DIAGNOSIS — J441 Chronic obstructive pulmonary disease with (acute) exacerbation: Secondary | ICD-10-CM

## 2023-07-12 DIAGNOSIS — J432 Centrilobular emphysema: Secondary | ICD-10-CM

## 2023-07-16 DIAGNOSIS — K219 Gastro-esophageal reflux disease without esophagitis: Secondary | ICD-10-CM | POA: Diagnosis not present

## 2023-07-16 DIAGNOSIS — Z79899 Other long term (current) drug therapy: Secondary | ICD-10-CM | POA: Diagnosis not present

## 2023-07-16 DIAGNOSIS — K449 Diaphragmatic hernia without obstruction or gangrene: Secondary | ICD-10-CM | POA: Diagnosis not present

## 2023-07-16 DIAGNOSIS — E785 Hyperlipidemia, unspecified: Secondary | ICD-10-CM | POA: Diagnosis not present

## 2023-07-16 DIAGNOSIS — Z96619 Presence of unspecified artificial shoulder joint: Secondary | ICD-10-CM | POA: Diagnosis not present

## 2023-07-16 DIAGNOSIS — M810 Age-related osteoporosis without current pathological fracture: Secondary | ICD-10-CM | POA: Diagnosis not present

## 2023-07-16 DIAGNOSIS — Z471 Aftercare following joint replacement surgery: Secondary | ICD-10-CM | POA: Diagnosis not present

## 2023-07-16 DIAGNOSIS — I10 Essential (primary) hypertension: Secondary | ICD-10-CM | POA: Diagnosis not present

## 2023-07-16 DIAGNOSIS — G8929 Other chronic pain: Secondary | ICD-10-CM | POA: Diagnosis not present

## 2023-07-16 DIAGNOSIS — Z7901 Long term (current) use of anticoagulants: Secondary | ICD-10-CM | POA: Diagnosis not present

## 2023-07-16 DIAGNOSIS — J439 Emphysema, unspecified: Secondary | ICD-10-CM | POA: Diagnosis not present

## 2023-07-16 DIAGNOSIS — Z79891 Long term (current) use of opiate analgesic: Secondary | ICD-10-CM | POA: Diagnosis not present

## 2023-07-16 DIAGNOSIS — Z96652 Presence of left artificial knee joint: Secondary | ICD-10-CM | POA: Diagnosis not present

## 2023-07-23 ENCOUNTER — Telehealth: Payer: Self-pay | Admitting: Family Medicine

## 2023-07-23 DIAGNOSIS — E785 Hyperlipidemia, unspecified: Secondary | ICD-10-CM | POA: Diagnosis not present

## 2023-07-23 DIAGNOSIS — G8929 Other chronic pain: Secondary | ICD-10-CM | POA: Diagnosis not present

## 2023-07-23 DIAGNOSIS — I10 Essential (primary) hypertension: Secondary | ICD-10-CM | POA: Diagnosis not present

## 2023-07-23 DIAGNOSIS — J439 Emphysema, unspecified: Secondary | ICD-10-CM | POA: Diagnosis not present

## 2023-07-23 DIAGNOSIS — K449 Diaphragmatic hernia without obstruction or gangrene: Secondary | ICD-10-CM | POA: Diagnosis not present

## 2023-07-23 DIAGNOSIS — M810 Age-related osteoporosis without current pathological fracture: Secondary | ICD-10-CM | POA: Diagnosis not present

## 2023-07-23 DIAGNOSIS — Z7901 Long term (current) use of anticoagulants: Secondary | ICD-10-CM | POA: Diagnosis not present

## 2023-07-23 DIAGNOSIS — Z79891 Long term (current) use of opiate analgesic: Secondary | ICD-10-CM | POA: Diagnosis not present

## 2023-07-23 DIAGNOSIS — Z96619 Presence of unspecified artificial shoulder joint: Secondary | ICD-10-CM | POA: Diagnosis not present

## 2023-07-23 DIAGNOSIS — Z471 Aftercare following joint replacement surgery: Secondary | ICD-10-CM | POA: Diagnosis not present

## 2023-07-23 DIAGNOSIS — K219 Gastro-esophageal reflux disease without esophagitis: Secondary | ICD-10-CM | POA: Diagnosis not present

## 2023-07-23 DIAGNOSIS — Z96652 Presence of left artificial knee joint: Secondary | ICD-10-CM | POA: Diagnosis not present

## 2023-07-23 DIAGNOSIS — Z79899 Other long term (current) drug therapy: Secondary | ICD-10-CM | POA: Diagnosis not present

## 2023-07-23 NOTE — Telephone Encounter (Signed)
Called 07/23/2023 to sched AWV - MAILBOX FULL  Verlee Rossetti; Care Guide Ambulatory Clinical Support Lily l [place] [facility] Direct Dial: [phone]

## 2023-07-24 ENCOUNTER — Telehealth: Payer: Self-pay | Admitting: Family Medicine

## 2023-07-24 DIAGNOSIS — I10 Essential (primary) hypertension: Secondary | ICD-10-CM

## 2023-07-24 DIAGNOSIS — J432 Centrilobular emphysema: Secondary | ICD-10-CM

## 2023-07-24 DIAGNOSIS — M170 Bilateral primary osteoarthritis of knee: Secondary | ICD-10-CM

## 2023-07-24 NOTE — Telephone Encounter (Signed)
Home Health Verbal Orders - Caller/Agency: Pervis Hocking with [facility] Callback Number: [phone]   Following pt for 6 months for PT. Darl Pikes stated pt was discharged yesterday. Has called a few times about getting medication prepackaged. 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   Please advise.

## 2023-07-24 NOTE — Telephone Encounter (Signed)
I will refer patient to clinical pharmacy to assist with pill pack setup. I know this can be pharmacy specific as well.  Route chart to Estelle Grumbles [facility] CPP for review and assistance on next steps.  [name], DO [facility] 07/24/2023, 12:04 PM

## 2023-07-24 NOTE — Telephone Encounter (Signed)
Darl Pikes with [facility] wants [place] to help patient get prepackaged medications. She says with her [facility] patients they use a program where all they have to do is call and everything is done for them. She suspects [place] has the same program. I am not familiar.

## 2023-07-25 ENCOUNTER — Other Ambulatory Visit: Payer: Self-pay | Admitting: Pharmacist

## 2023-07-25 NOTE — Progress Notes (Signed)
Outreach Note  07/25/2023 Name: ALESSI WARR MRN: [id number] DOB: 1946/09/23  Referred by: Smitty Cords, DO Reason for referral : Medication Adherence  Receive a referral from PCP requesting outreach to patient to help with setup for adherence packaging. From review of chart, note Pervis Hocking from [facility] contacted office requesting help with getting patient setup for pill pack  Attempt to reach patient by telephone on number listed in chart, but number is not currently in service.  Outreach to [employer] from [facility]. Darl Pikes advises that she has discharged patient from [facility]. Patient shares that she has been reaching patient on the following phone numbers: [phone] and [phone].  Dial [phone], but this number is also not in service.  Dial [phone] and reach patient's son, Theone Murdoch. Eli states that patient is not currently with him, but takes my phone number and states that he will have patient call me back in a few minutes, but no return call received   Follow Up Plan: Clinical Pharmacist will attempt to reach patient by telephone again within the next 14 days.  Estelle Grumbles, PharmD, P[place]y [place], CPP Clinical Pharmacist [facility] [phone]

## 2023-07-31 ENCOUNTER — Telehealth: Payer: Self-pay

## 2023-07-31 NOTE — Telephone Encounter (Signed)
Copied from CRM [id number]. Topic: General - Other >> Jul 31, 2023  3:26 PM Everette C wrote: Reason for CRM: The patient's husband has called to follow up on previous discussions related to an order for bed rails for the patient   Please contact further when possible at  [phone]

## 2023-07-31 NOTE — Telephone Encounter (Signed)
I do not see a prior conversation on bed rails. There were several convo on the walker. That should have been resolved. I cannot find any info on bed rail request.  I did some research. Detachable bed rails - used on a regular bed are not covered by Medicare.  It is an out of pocket cost, they can identify the external bed rails they want and purchase or order them from any medical supply store but it would be out of pocket cost.  [name], DO [facility] 07/31/2023, 5:26 PM

## 2023-07-31 NOTE — Telephone Encounter (Signed)
Please review. Do you have an order for bed rails?   Thanks,   -Vernona Rieger

## 2023-08-01 NOTE — Telephone Encounter (Signed)
Left message for patient to call back to give Dr Althea Charon response.  Ok for [facility] to [employer].

## 2023-08-06 ENCOUNTER — Ambulatory Visit: Payer: Medicare Other | Admitting: Pharmacist

## 2023-08-06 DIAGNOSIS — I10 Essential (primary) hypertension: Secondary | ICD-10-CM

## 2023-08-06 NOTE — Progress Notes (Signed)
08/06/2023 Name: Pamela Ball MRN: [id number] DOB: [birth year]  Chief Complaint  Patient presents with   Medication Adherence    Pamela Ball is a 77 y.o. year old female who was referred to the pharmacist by their PCP for assistance in managing medication adherence. Requests outreach to patient to discuss adherence packaging,  Speak with patient's husband, Pamela Ball (listed in DPR in chart).   Subjective:  Care Team: Primary Care Provider: Smitty Cords, DO Psychiatrist: Lynett Fish, MD  Medication Access/Adherence  [facility]  [phone] [place], [place] - [address] [place] [address] Phone: [phone] Fax: [phone]  [facility]) - [place], [place] - [address] [facility] [address] [place] [address] [place] [address] Phone: [phone] Fax: [phone]  [id number] [place], [place] - [address] AT [facility] [address] [place] [address] Phone: [phone] Fax: [phone]  [facility] [address], [place] - [address] [place] [address] Phone: [phone] Fax: [phone]   Patient reports affordability concerns with their medications: No  Patient reports access/transportation concerns to their pharmacy: No  Patient reports adherence concerns with their medications:  Yes    Spouse reports that he manages patient's medications for her; fills weekly pillbox for patient - Expresses interest in receiving patient's medications in adherence packaging   Objective:  Lab Results  Component Value Date   CREATININE 0.56 (L) 10/23/2022   BUN 12 10/23/2022   NA 140 10/23/2022   K 4.6 10/23/2022   CL 99 10/23/2022   CO2 33 (H) 10/23/2022     Medications Reviewed Today     Reviewed by Manuela Neptune, RPH-CPP (Pharmacist) on 08/06/23 at 1140  Med List Status: <None>   Medication Order Taking?  Sig Documenting Provider Last Dose Status Informant  albuterol (VENTOLIN HFA) 108 (90 Base) MCG/ACT inhaler [id number] Yes INHALE 2 PUFFS BY MOUTH EVERY 4 HOURS AS NEEDED FOR WHEEZING OR SHORTNESS OF BREATH (COUGH). Smitty Cords, DO Taking Active   amLODipine (NORVASC) 10 MG tablet [id number] Yes TAKE 1 TABLET BY MOUTH EVERY DAY Lorre Munroe, NP Taking Active   Ascorbic Acid (VITAMIN C PO) [id number] Yes Take 1 tablet by mouth daily. [provider] Taking Active Spouse/Significant Other  Aspirin-Caffeine (BC FAST PAIN RELIEF PO) [id number] No Take 1 packet by mouth 4 (four) times daily as needed (pain).  Patient not taking: Reported on 08/06/2023   [provider] Not Taking Active Spouse/Significant Other  buPROPion (WELLBUTRIN XL) 150 MG 24 hr tablet [id number] Yes Take 150 mg by mouth daily. [provider] Taking Active Spouse/Significant Other  Calcium-Vitamin D-Vitamin K ([facility] CALCIUM SOFT CHEWS PO) [id number] Yes Take 1 tablet by mouth daily. [provider] Taking Active   Cholecalciferol (VITAMIN D3) 1000 units CAPS [id number] Yes Take 1 capsule by mouth daily. [provider] Taking Active   docusate sodium (COLACE) 100 MG capsule [id number] Yes Take 1 capsule (100 mg total) by mouth 2 (two) times daily.  Patient taking differently: Take 100 mg by mouth daily.   Evon Slack, PA-C Taking Active Spouse/Significant Other  feeding supplement (ENSURE ENLIVE / ENSURE PLUS) LIQD [id number]  Take 237 mLs by mouth 3 (three) times daily between meals. Lynn Ito, MD  Active Spouse/Significant Other  Lidocaine 4 % GEL [id number] No Apply 1 application  topically every 4 (four) hours  as needed.  Patient not taking: Reported on 05/16/2023   Chinita Pester, FNP Not Taking Active   Multiple Vitamin (MULTIVITAMIN WITH MINERALS) TABS tablet [id number] Yes Take 1 tablet by mouth daily. [provider] Taking Active Spouse/Significant Other   Multiple Vitamins-Minerals (PRESERVISION AREDS PO) [id number] Yes Take 1 tablet by mouth daily. [provider] Taking Active   OLANZapine (ZYPREXA) 7.5 MG tablet [id number] Yes Take 7.5 mg by mouth daily. [provider] Taking Active Spouse/Significant Other  omeprazole (PRILOSEC) 20 MG capsule [id number] Yes TAKE 1 CAPSULE BY MOUTH EVERY DAY BEFORE BREAKFAST Karamalegos, Netta Neat, DO Taking Active   ondansetron (ZOFRAN-ODT) 4 MG disintegrating tablet [id number] No TAKE 1 TABLET BY MOUTH EVERY 8 HOURS AS NEEDED FOR NAUSEA AND VOMITING  Patient not taking: Reported on 05/16/2023   Smitty Cords, DO Not Taking Active   simvastatin (ZOCOR) 20 MG tablet [id number] Yes Take 1 tablet (20 mg total) by mouth daily. Smitty Cords, DO Taking Active   venlafaxine XR (EFFEXOR-XR) 75 MG 24 hr capsule [id number] Yes Take 75 mg by mouth daily with breakfast.  [provider] Taking Active Spouse/Significant Other  vitamin B-12 1000 MCG tablet [id number] Yes Take 3 tablets (3,000 mcg total) by mouth daily.  Patient taking differently: Take 1,000 mcg by mouth daily.   Lynn Ito, MD Taking Active Spouse/Significant Other              Assessment/Plan:   Comprehensive medication review performed; medication list updated in electronic medical record   Discuss with caregiver pharmacies in the area that offer adherence packaging. Patient would like to use [facility].  - Offer to call [facility] today with caregiver, but he declines. States that he will contact [facility] himself to discuss getting patient setup for adherence packaging.  Follow Up Plan: Clinical Pharmacist will follow up with patient by telephone on 08/20/2023 at 3:15 PM   Estelle Grumbles, PharmD, [place]y [place], CPP Clinical Pharmacist [facility] [phone]

## 2023-08-06 NOTE — Patient Instructions (Addendum)
It was a pleasure speaking with you today. Please contact [facility] at [phone] to discuss the adherence packaging.  Thank you!  Estelle Grumbles, PharmD, P[place]y [place], CPP Clinical Pharmacist [facility] [phone]

## 2023-08-20 ENCOUNTER — Other Ambulatory Visit: Payer: Self-pay | Admitting: Internal Medicine

## 2023-08-20 ENCOUNTER — Telehealth: Payer: Self-pay | Admitting: Pharmacist

## 2023-08-20 ENCOUNTER — Telehealth: Payer: Medicare Other

## 2023-08-20 DIAGNOSIS — I10 Essential (primary) hypertension: Secondary | ICD-10-CM

## 2023-08-20 NOTE — Progress Notes (Signed)
Outreach Note  08/20/2023 Name: Pamela Ball MRN: [id number] DOB: 25-Aug-1946  Referred by: Smitty Cords, DO Reason for referral : Medication Adherence  Was unable to reach patient via telephone today and have left HIPAA compliant voicemail asking patient to return my call.   Follow Up Plan: Will attempt to reach patient/spouse by telephone again within the next 30 days  Estelle Grumbles, PharmD, [place], CPP Clinical Pharmacist [facility] [phone]

## 2023-08-20 NOTE — Telephone Encounter (Signed)
Requested Prescriptions  Pending Prescriptions Disp Refills   amLODipine (NORVASC) 10 MG tablet [Pharmacy Med Name: AMLODIPINE BESYLATE 10 MG TAB] 90 tablet 0    Sig: TAKE 1 TABLET BY MOUTH EVERY DAY     Cardiovascular: Calcium Channel Blockers 2 Failed - 08/20/2023  1:38 AM      Failed - Last BP in normal range    BP Readings from Last 1 Encounters:  05/16/23 (!) 158/60         Passed - Last Heart Rate in normal range    Pulse Readings from Last 1 Encounters:  05/16/23 84         Passed - Valid encounter within last 6 months    Recent Outpatient Visits           2 weeks ago Benign hypertension   [place] [facility] [place], Jackelyn Poling, RPH-CPP   3 months ago Keratoacanthoma of lower leg   [facility] [place], Netta Neat, DO   5 months ago Actinic keratosis   [place] [facility] [facility] Smitty Cords, DO   6 months ago Herpes zoster without complication   [place] [facility] Smitty Cords, DO   8 months ago Acute cystitis without hematuria   [place] [facility] [place], Netta Neat, [place]

## 2023-08-24 ENCOUNTER — Ambulatory Visit: Payer: Medicare Other | Admitting: Family Medicine

## 2023-09-03 ENCOUNTER — Ambulatory Visit: Payer: Self-pay

## 2023-09-03 DIAGNOSIS — M25551 Pain in right hip: Secondary | ICD-10-CM

## 2023-09-03 NOTE — Addendum Note (Signed)
Addended by: Smitty Cords on: 09/03/2023 12:58 PM   Modules accepted: Orders

## 2023-09-03 NOTE — Telephone Encounter (Signed)
Chief Complaint: Fall Symptoms: right leg pain with swelling, weakness on the right side  Frequency: 1 fall Pertinent Negatives: Patient denies other symptoms  Disposition: [] ED /[] Urgent Care (no appt availability in office) / [x] Appointment(In office/virtual)/ []  [facility]/ [] Home Care/ [] Refused Recommended Disposition /[] [place] [facility]/ []  Follow-up with PCP Additional Notes: Spoke to patient's husband Reita Cliche (listed on signed DPR). Reita Cliche stated the patient fell last week landing on her right side. She is having severe right leg pain 10/10 on pain scale. Patient has a Hx of 2 surgeries on the right leg. Patient has tried over the counter pain medication without much relief. Reita Cliche thinks the patient needs an x-ray done of her leg. Care advice given and patient has been scheduled to seen tomorrow at 1100. Advised patient to callback if symptoms get worse.   Reason for Disposition  [1] MODERATE weakness (i.e., interferes with work, school, normal activities) AND [2] new-onset or worsening  Answer Assessment - Initial Assessment Questions 1. MECHANISM: "How did the fall happen?"     She got turned around and fall  2. DOMESTIC VIOLENCE AND ELDER ABUSE SCREENING: "Did you fall because someone pushed you or tried to hurt you?" If Yes, ask: "Are you safe now?"     No  3. ONSET: "When did the fall happen?" (e.g., minutes, hours, or days ago)     Last week  4. LOCATION: "What part of the body hit the ground?" (e.g., back, buttocks, head, hips, knees, hands, head, stomach)     Right side of the body  5. INJURY: "Did you hurt (injure) yourself when you fell?" If Yes, ask: "What did you injure? Tell me more about this?" (e.g., body area; type of injury; pain severity)"     Right leg  6. PAIN: "Is there any pain?" If Yes, ask: "How bad is the pain?" (e.g., Scale 1-10; or mild,  moderate, severe)   - NONE (0): No pain   - MILD (1-3): Doesn't interfere with normal activities    -  MODERATE (4-7): Interferes with normal activities or awakens from sleep    - SEVERE (8-10): Excruciating pain, unable to do any normal activities      10/10 7. SIZE: For cuts, bruises, or swelling, ask: "How large is it?" (e.g., inches or centimeters)      A little swelling in the leg 9. OTHER SYMPTOMS: "Do you have any other symptoms?" (e.g., dizziness, fever, weakness; new onset or worsening).      No  10. CAUSE: "What do you think caused the fall (or falling)?" (e.g., tripped, dizzy spell)       Tripped  Protocols used: Falls and [facility]

## 2023-09-03 NOTE — Telephone Encounter (Signed)
Advised patients husband to arrive early for xray

## 2023-09-03 NOTE — Telephone Encounter (Signed)
Please notify them about coming in earlier tomorrow for X-ray if possible, anytime is fine. If they arrive right on time for the apt at 11am, we may not be able to do X-ray in advance.  Reviewed triage. We can see her tomorrow as scheduled 10/29 at 11  I will place an order now for Right Hip X-ray STAT and she can do this before her apt if they arrive early.  I may not be able to get results in time for her visit. But we can call them back with results.  Also, we had a similar conversation recently a few week ago and they were advised to go to Orthopedics for follow-up given her past history of fractures and complicated injuries.  I am happy to see her and do the X-ray but ultimately they may be going to orthopedics anyway after the visit.  [name], DO [facility] 09/03/2023, 12:53 PM

## 2023-09-04 ENCOUNTER — Encounter: Payer: Self-pay | Admitting: Family Medicine

## 2023-09-04 ENCOUNTER — Ambulatory Visit
Admission: RE | Admit: 2023-09-04 | Discharge: 2023-09-04 | Disposition: A | Discharge status: Home / Self Care | Payer: Medicare Other | Source: Ambulatory Visit | Attending: Family Medicine | Admitting: Family Medicine

## 2023-09-04 ENCOUNTER — Ambulatory Visit
Admission: RE | Admit: 2023-09-04 | Discharge: 2023-09-04 | Disposition: A | Discharge status: Home / Self Care | Payer: Medicare Other | Attending: Family Medicine | Admitting: Family Medicine

## 2023-09-04 ENCOUNTER — Ambulatory Visit (INDEPENDENT_AMBULATORY_CARE_PROVIDER_SITE_OTHER): Payer: Medicare Other | Admitting: Family Medicine

## 2023-09-04 VITALS — BP 138/84 | HR 96 | Ht <= 58 in | Wt 91.6 lb

## 2023-09-04 DIAGNOSIS — Z96652 Presence of left artificial knee joint: Secondary | ICD-10-CM | POA: Diagnosis not present

## 2023-09-04 DIAGNOSIS — M170 Bilateral primary osteoarthritis of knee: Secondary | ICD-10-CM | POA: Diagnosis not present

## 2023-09-04 DIAGNOSIS — M25551 Pain in right hip: Secondary | ICD-10-CM | POA: Diagnosis not present

## 2023-09-04 DIAGNOSIS — Z471 Aftercare following joint replacement surgery: Secondary | ICD-10-CM | POA: Diagnosis not present

## 2023-09-04 DIAGNOSIS — Z043 Encounter for examination and observation following other accident: Secondary | ICD-10-CM | POA: Diagnosis not present

## 2023-09-04 DIAGNOSIS — M79605 Pain in left leg: Secondary | ICD-10-CM | POA: Diagnosis not present

## 2023-09-04 NOTE — Patient Instructions (Addendum)
Thank you for coming to the office today.  Stay tuned for update on the X-ray result  For a detachable bed rail for a regular bed. Check with any of these locations. Not covered by insurance. So it is an out of pocket cost.  [facility] [address] [place], [place]  [address] Ph: [phone] Fax: [phone]  [facility]. [address] [place], [place] [address] Ph: [phone] Fax: [phone]  [facility] [address] [place], [place] [address] Open until 5PM Phone: [phone] Fax: [phone]  Please schedule a Follow-up Appointment to: Return if symptoms worsen or fail to improve.  If you have any other questions or concerns, please feel free to call the office or send a message through MyChart. You may also schedule an earlier appointment if necessary.  Additionally, you may be receiving a survey about your experience at our office within a few days to 1 week by e-mail or mail. We value your feedback.  [name], DO [facility], [place]

## 2023-09-04 NOTE — Progress Notes (Signed)
Subjective:    Patient ID: Pamela Ball, female    DOB: [birth year], 77 y.o.   MRN: [id number]  Pamela Ball is a 77 y.o. female presenting on 09/04/2023 for Fall (3 weeks ago, left leg pain persistent, not changing, with difficulty walking)  Patient presents for a same day appointment.  Here with husband Reita Cliche  HPI  Discussed the use of AI scribe software for clinical note transcription with the patient, who gave verbal consent to proceed.     The patient, with a history of orthopedic hardware implantation, presented with left leg pain following a fall three weeks ago. The pain is localized to the outer part of the left lower leg near the knee and is severe enough to limit her ability to walk. The patient also reported a subsequent fall two to three weeks later, which resulted in pain in both knees. The patient denied any difficulty in lifting the leg or extending the knee. She has a walker and is in wheelchair currently today. X-ray was done earlier today before apt, but results not available yet.  In addition to the left leg pain, the patient also reported an injury to the right leg from the second fall. The pain is localized to a similar area as the left leg. The patient was able to lift the right leg and extend the knee, suggesting that the range of motion is preserved in both legs.  The patient has been managing the pain with Tylenol, taken one every two to three hours, and BC powders as needed. She expressed a preference to avoid stronger pain medications. She had been on opioid therapy in the past, but currently no longer on this.  The patient also mentioned a high bed at home and a previous request for a bed rail to prevent future falls. However, she was informed that her insurance does not cover bed rails for non-hospital beds.          03/15/2023    9:33 AM 02/01/2023    8:51 AM 10/19/2022   10:39 AM  Depression screen PHQ 2/9  Decreased Interest 0 0 0  Down,  Depressed, Hopeless 0 0 0  PHQ - 2 Score 0 0 0  Altered sleeping  0 3  Tired, decreased energy  0 0  Change in appetite  0 3  Feeling bad or failure about yourself   0 0  Trouble concentrating  0 0  Moving slowly or fidgety/restless  0 0  Suicidal thoughts  0 0  PHQ-9 Score  0 6  Difficult doing work/chores  Not difficult at all Not difficult at all    Social History   Tobacco Use   Smoking status: Never   Smokeless tobacco: Never  Vaping Use   Vaping status: Never Used  Substance Use Topics   Alcohol use: No    Comment: drink occasionally    Drug use: No    Review of Systems Per HPI unless specifically indicated above     Objective:    BP 138/84 (BP Location: Left Arm, Cuff Size: Normal)   Pulse 96   Ht 4\' 10"  (1.473 m)   Wt 91 lb 9.6 oz (41.5 kg)   SpO2 (!) 89%   BMI 19.14 kg/m   Wt Readings from Last 3 Encounters:  09/04/23 91 lb 9.6 oz (41.5 kg)  05/16/23 95 lb (43.1 kg)  03/15/23 95 lb (43.1 kg)    Physical Exam Vitals and nursing note reviewed.  Constitutional:  General: She is not in acute distress.    Appearance: Normal appearance. She is well-developed. She is not diaphoretic.     Comments: Elderly frail 77 yr old female, comfortable, cooperative  HENT:     Head: Normocephalic and atraumatic.  Eyes:     General:        Right eye: No discharge.        Left eye: No discharge.     Conjunctiva/sclera: Conjunctivae normal.  Cardiovascular:     Rate and Rhythm: Normal rate.  Pulmonary:     Effort: Pulmonary effort is normal.  Musculoskeletal:     Comments: Sitting in wheelchair  Bilateral Hip flexion and extension intact without provoked pain.  Knee flex / extension intact bilateral. Did not test weight bearing ambulation today. No obvious swelling or localized deformity or ecchymosis.  Skin:    General: Skin is warm and dry.     Findings: No erythema or rash.  Neurological:     Mental Status: She is alert and oriented to person, place,  and time.  Psychiatric:        Mood and Affect: Mood normal.        Behavior: Behavior normal.        Thought Content: Thought content normal.     Comments: Well groomed, good eye contact, normal speech and thoughts      Results for orders placed or performed in visit on 12/04/22  Urine Culture   Specimen: Urine  Result Value Ref Range   MICRO NUMBER: [id number]    SPECIMEN QUALITY: Adequate    Sample Source URINE, CLEAN CATCH    STATUS: FINAL    Result: No Growth   Urinalysis, Routine w reflex microscopic  Result Value Ref Range   Color, Urine YELLOW YELLOW   APPearance CLEAR CLEAR   Specific Gravity, Urine 1.012 1.001 - 1.035   pH 6.0 5.0 - 8.0   Glucose, UA NEGATIVE NEGATIVE   Bilirubin Urine NEGATIVE NEGATIVE   Ketones, ur NEGATIVE NEGATIVE   Hgb urine dipstick NEGATIVE NEGATIVE   Protein, ur NEGATIVE NEGATIVE   Nitrite NEGATIVE NEGATIVE   Leukocytes,Ua NEGATIVE NEGATIVE      Assessment & Plan:   Problem List Items Addressed This Visit     Osteoarthritis of knees, bilateral   Other Visit Diagnoses     Acute pain of left lower extremity    -  Primary       Assessment and Plan    Left Lower Leg Pain Pain localized to the outer part of the left lower leg near the knee, following a fall three weeks ago. X-ray obtained, no obvious fracture noted on initial review by myself today, awaiting official radiology report. -Await official radiology report. -Plan to call patient with results.  Right Lower Leg Pain Pain in the right lower leg following a fall. No imaging obtained during this visit. -Consider further evaluation if pain persists or worsens.  Pain Management Patient currently managing pain with Tylenol and BC Powder as needed. Patient declined additional pain medication. -Continue current regimen as tolerated.  Fall Risk Patient has a high bed and has fallen from it previously. Discussed the need for a bed rail, which is not covered by  insurance. -Recommended patient to check with listed supply stores for out-of-pocket purchase of a detachable bed rail. If they agree to hospital bed order in future we can pursue this with bed rails.  Follow-up If pain persists or if the cause of the pain  is not identified, consider referral back to orthopedics. -Plan to follow-up with patient regarding x-ray results.      No orders of the defined types were placed in this encounter.     Follow up plan: Return if symptoms worsen or fail to improve.  [name], DO [facility] 09/04/2023, 11:23 AM

## 2023-09-10 ENCOUNTER — Other Ambulatory Visit: Payer: Medicare Other | Admitting: Pharmacist

## 2023-09-10 NOTE — Progress Notes (Signed)
09/10/2023 Name: Pamela Ball MRN: [id number] DOB: [birth year]  Chief Complaint  Patient presents with   Medication Adherence    Pamela Ball is a 77 y.o. year old female who was referred to the pharmacist by their PCP for assistance in managing medication adherence/pill packaging.    Speak with patient's husband, Pamela Ball (listed in DPR in chart).     Subjective:   Care Team: Primary Care Provider: Smitty Cords, DO Psychiatrist: Lynett Fish, MD  Medication Access/Adherence  [facility]  [phone] [place], [place] - [address] [place] [address] Phone: [phone] Fax: [phone]  [facility]) - [place], [place] - [address] [facility] [address] [place] [address] [place] [address] Phone: [phone] Fax: [phone]  [id number] [place], [place] - [address] AT [facility] [address] [place] [address] Phone: [phone] Fax: [phone]  [facility] [address], [place] - [address] [place] [address] Phone: [phone] Fax: [phone]   Patient reports affordability concerns with their medications: No  Patient reports access/transportation concerns to their pharmacy: No  Patient reports adherence concerns with their medications:  Yes     Spouse reports that he manages patient's medications for her; fills weekly pillbox for patient - Again expresses interest in receiving patient's medications in adherence packaging. States that he went by [facility] and spoke with owner Sam about switching to this pharmacy for pill packaging. Reports planning to make this switch in a couple of weeks     Objective:  Lab Results  Component Value Date   CREATININE 0.56 (L) 10/23/2022   BUN 12 10/23/2022   NA 140 10/23/2022   K 4.6 10/23/2022   CL 99 10/23/2022   CO2 33 (H) 10/23/2022    Current Outpatient  Medications on File Prior to Visit  Medication Sig Dispense Refill   albuterol (VENTOLIN HFA) 108 (90 Base) MCG/ACT inhaler INHALE 2 PUFFS BY MOUTH EVERY 4 HOURS AS NEEDED FOR WHEEZING OR SHORTNESS OF BREATH (COUGH). 6.7 each 1   amLODipine (NORVASC) 10 MG tablet TAKE 1 TABLET BY MOUTH EVERY DAY 90 tablet 0   Ascorbic Acid (VITAMIN C PO) Take 1 tablet by mouth daily.     Aspirin-Caffeine (BC FAST PAIN RELIEF PO) Take 1 packet by mouth 4 (four) times daily as needed (pain). (Patient not taking: Reported on 08/06/2023)     buPROPion (WELLBUTRIN XL) 150 MG 24 hr tablet Take 150 mg by mouth daily.     Calcium-Vitamin D-Vitamin K ([facility] CALCIUM SOFT CHEWS PO) Take 1 tablet by mouth daily.     Cholecalciferol (VITAMIN D3) 1000 units CAPS Take 1 capsule by mouth daily.     docusate sodium (COLACE) 100 MG capsule Take 1 capsule (100 mg total) by mouth 2 (two) times daily. (Patient taking differently: Take 100 mg by mouth daily.) 10 capsule 0   feeding supplement (ENSURE ENLIVE / ENSURE PLUS) LIQD Take 237 mLs by mouth 3 (three) times daily between meals. 237 mL 12   Lidocaine 4 % GEL Apply 1 application  topically every 4 (four) hours as needed. (Patient not taking: Reported on 05/16/2023) 10 g 1   Multiple Vitamin (MULTIVITAMIN WITH MINERALS) TABS tablet Take 1 tablet by mouth daily.     Multiple Vitamins-Minerals (PRESERVISION AREDS PO) Take 1 tablet by mouth daily.     OLANZapine (ZYPREXA)  7.5 MG tablet Take 7.5 mg by mouth daily.     omeprazole (PRILOSEC) 20 MG capsule TAKE 1 CAPSULE BY MOUTH EVERY DAY BEFORE BREAKFAST 90 capsule 1   ondansetron (ZOFRAN-ODT) 4 MG disintegrating tablet TAKE 1 TABLET BY MOUTH EVERY 8 HOURS AS NEEDED FOR NAUSEA AND VOMITING (Patient not taking: Reported on 05/16/2023) 30 tablet 2   simvastatin (ZOCOR) 20 MG tablet Take 1 tablet (20 mg total) by mouth daily. 90 tablet 3   venlafaxine XR (EFFEXOR-XR) 75 MG 24 hr capsule Take 75 mg by mouth daily with breakfast.      vitamin  B-12 1000 MCG tablet Take 3 tablets (3,000 mcg total) by mouth daily. (Patient taking differently: Take 1,000 mcg by mouth daily.)     No current facility-administered medications on file prior to visit.      Assessment/Plan:   Spouse denies need for assistance at this time, but request pharmacist call back in a couple of weeks to follow up  States that he will contact [facility] himself to get patient setup for adherence packaging.   Follow Up Plan: Clinical Pharmacist will follow up with patient by telephone on 10/01/2023 at 3:15 PM     Estelle Grumbles, PharmD, [place]y [place], CPP Clinical Pharmacist [facility] [phone]

## 2023-09-10 NOTE — Patient Instructions (Signed)
It was a pleasure speaking with you today. Please contact [facility] at [phone] to discuss the adherence packaging.  Thank you!  Estelle Grumbles, PharmD, P[place]y [place], CPP Clinical Pharmacist [facility] [phone]

## 2023-09-14 ENCOUNTER — Ambulatory Visit: Payer: Self-pay | Admitting: *Deleted

## 2023-09-14 DIAGNOSIS — M810 Age-related osteoporosis without current pathological fracture: Secondary | ICD-10-CM

## 2023-09-14 MED ORDER — ALENDRONATE SODIUM 70 MG PO TABS
70.0000 mg | ORAL_TABLET | ORAL | 1 refills | Status: DC
Start: 2023-09-14 — End: 2024-03-24

## 2023-09-14 NOTE — Telephone Encounter (Signed)
Refill Ordered Alendronate Sodium 70mg  weekly for osteoporosis.  [name], DO [facility] 09/14/2023, 12:15 PM

## 2023-09-14 NOTE — Telephone Encounter (Signed)
  Chief Complaint: Husband Reita Cliche is requesting a refill on the "Medicine for her bones".  He doesn't know the name of it.  "She's been on it for years".   He didn't have the bottle to tell me the name of the medication.   Looking on her medication list I don't see a medication for her bones that she takes once a week.  There are several OTC vitamins listed but not one that is a prescription. Symptoms: N/A Frequency: N/A Pertinent Negatives: Patient denies N/A Disposition: [] ED /[] Urgent Care (no appt availability in office) / [] Appointment(In office/virtual)/ []  [place] [facility]/ [] Home Care/ [] Refused Recommended Disposition /[] [place] [facility]/ [x]  Follow-up with PCP Additional Notes: I let Bobby know I would send Dr. Althea Charon a message to see what medication he is requesting.       Pt sounds like he could use some guidance/help with medications.

## 2023-09-14 NOTE — Telephone Encounter (Signed)
Message from [place] sent at 09/14/2023 10:09 AM EST  Summary: medication management   Patient spouse called to request a medication refill for patient, caller is unsure of the medication name and states patient takes meds for her  bones. The medication has the word sodium attached to it. Caller takes he called pharmacy yesterday, chart does not reflect    [id number] [place], [place] - [address] Phone: [phone] Fax: [phone]          Call History  Contact Date/Time Type Contact Phone/Fax User  09/14/2023 10:06 AM EST Phone (Incoming) Len Blalock (Emergency Contact) [phone] Judie Petit) Elliot Gault   Reason for Disposition  [1] Caller has URGENT medicine question about med that PCP or specialist prescribed AND [2] triager unable to answer question  Answer Assessment - Initial Assessment Questions 1. NAME of MEDICINE: "What medicine(s) are you calling about?"     I returned call to [facility].    He is calling about a refill needed.   Doesn't know the name of the medication needed.   It's needed for her bones.   She takes it once a week.    2. QUESTION: "What is your question?" (e.g., double dose of medicine, side effect)     [facility]   I checked her medication list.   There is a vitamin D3 listed but it's listed as historical.   I let him know we would check with Dr Althea Charon for clarification and [facility] [place] if that's appropriate.  3. PRESCRIBER: "Who prescribed the medicine?" Reason: if prescribed by specialist, call should be referred to that group.     Dr. Althea Charon.   4. SYMPTOMS: "Do you have any symptoms?" If Yes, ask: "What symptoms are you having?"  "How bad are the symptoms (e.g., mild, moderate, severe)     N/A 5. PREGNANCY:  "Is there any chance that you are pregnant?" "When was your last menstrual period?"     N/A  Protocols used: Medication Question Call-A-AH

## 2023-09-14 NOTE — Addendum Note (Signed)
Addended by: Smitty Cords on: 09/14/2023 12:16 PM   Modules accepted: Orders

## 2023-09-17 ENCOUNTER — Other Ambulatory Visit: Payer: Self-pay

## 2023-09-17 ENCOUNTER — Emergency Department
Admission: EM | Admit: 2023-09-17 | Discharge: 2023-09-17 | Disposition: A | Discharge status: Home / Self Care | Payer: Medicare Other | Attending: Emergency Medicine | Admitting: Emergency Medicine

## 2023-09-17 ENCOUNTER — Emergency Department: Payer: Medicare Other

## 2023-09-17 ENCOUNTER — Encounter: Payer: Self-pay | Admitting: Medical Oncology

## 2023-09-17 DIAGNOSIS — R339 Retention of urine, unspecified: Secondary | ICD-10-CM | POA: Diagnosis not present

## 2023-09-17 DIAGNOSIS — M25552 Pain in left hip: Secondary | ICD-10-CM | POA: Insufficient documentation

## 2023-09-17 DIAGNOSIS — M8580 Other specified disorders of bone density and structure, unspecified site: Secondary | ICD-10-CM | POA: Diagnosis not present

## 2023-09-17 DIAGNOSIS — R54 Age-related physical debility: Secondary | ICD-10-CM | POA: Diagnosis not present

## 2023-09-17 DIAGNOSIS — S3993XA Unspecified injury of pelvis, initial encounter: Secondary | ICD-10-CM | POA: Diagnosis not present

## 2023-09-17 DIAGNOSIS — W010XXA Fall on same level from slipping, tripping and stumbling without subsequent striking against object, initial encounter: Secondary | ICD-10-CM | POA: Insufficient documentation

## 2023-09-17 DIAGNOSIS — J449 Chronic obstructive pulmonary disease, unspecified: Secondary | ICD-10-CM | POA: Insufficient documentation

## 2023-09-17 DIAGNOSIS — J439 Emphysema, unspecified: Secondary | ICD-10-CM | POA: Diagnosis not present

## 2023-09-17 DIAGNOSIS — M9702XA Periprosthetic fracture around internal prosthetic left hip joint, initial encounter: Secondary | ICD-10-CM | POA: Diagnosis not present

## 2023-09-17 DIAGNOSIS — K219 Gastro-esophageal reflux disease without esophagitis: Secondary | ICD-10-CM | POA: Diagnosis not present

## 2023-09-17 DIAGNOSIS — I10 Essential (primary) hypertension: Secondary | ICD-10-CM | POA: Diagnosis not present

## 2023-09-17 DIAGNOSIS — Z01818 Encounter for other preprocedural examination: Secondary | ICD-10-CM | POA: Diagnosis not present

## 2023-09-17 DIAGNOSIS — Z8673 Personal history of transient ischemic attack (TIA), and cerebral infarction without residual deficits: Secondary | ICD-10-CM | POA: Diagnosis not present

## 2023-09-17 DIAGNOSIS — S32512A Fracture of superior rim of left pubis, initial encounter for closed fracture: Secondary | ICD-10-CM | POA: Diagnosis not present

## 2023-09-17 DIAGNOSIS — M79605 Pain in left leg: Secondary | ICD-10-CM | POA: Diagnosis not present

## 2023-09-17 DIAGNOSIS — R410 Disorientation, unspecified: Secondary | ICD-10-CM | POA: Diagnosis not present

## 2023-09-17 DIAGNOSIS — Z7983 Long term (current) use of bisphosphonates: Secondary | ICD-10-CM | POA: Diagnosis not present

## 2023-09-17 DIAGNOSIS — T402X5A Adverse effect of other opioids, initial encounter: Secondary | ICD-10-CM | POA: Diagnosis not present

## 2023-09-17 DIAGNOSIS — H548 Legal blindness, as defined in USA: Secondary | ICD-10-CM | POA: Diagnosis not present

## 2023-09-17 DIAGNOSIS — M810 Age-related osteoporosis without current pathological fracture: Secondary | ICD-10-CM | POA: Diagnosis not present

## 2023-09-17 DIAGNOSIS — E43 Unspecified severe protein-calorie malnutrition: Secondary | ICD-10-CM | POA: Diagnosis not present

## 2023-09-17 DIAGNOSIS — Z736 Limitation of activities due to disability: Secondary | ICD-10-CM | POA: Diagnosis not present

## 2023-09-17 DIAGNOSIS — W19XXXA Unspecified fall, initial encounter: Secondary | ICD-10-CM | POA: Diagnosis not present

## 2023-09-17 DIAGNOSIS — M1612 Unilateral primary osteoarthritis, left hip: Secondary | ICD-10-CM | POA: Diagnosis not present

## 2023-09-17 DIAGNOSIS — M85859 Other specified disorders of bone density and structure, unspecified thigh: Secondary | ICD-10-CM | POA: Diagnosis not present

## 2023-09-17 DIAGNOSIS — S72402A Unspecified fracture of lower end of left femur, initial encounter for closed fracture: Secondary | ICD-10-CM | POA: Diagnosis not present

## 2023-09-17 DIAGNOSIS — D62 Acute posthemorrhagic anemia: Secondary | ICD-10-CM | POA: Diagnosis not present

## 2023-09-17 DIAGNOSIS — S72322A Displaced transverse fracture of shaft of left femur, initial encounter for closed fracture: Secondary | ICD-10-CM | POA: Diagnosis not present

## 2023-09-17 DIAGNOSIS — R918 Other nonspecific abnormal finding of lung field: Secondary | ICD-10-CM | POA: Diagnosis not present

## 2023-09-17 DIAGNOSIS — R0689 Other abnormalities of breathing: Secondary | ICD-10-CM | POA: Diagnosis not present

## 2023-09-17 DIAGNOSIS — Z681 Body mass index (BMI) 19 or less, adult: Secondary | ICD-10-CM | POA: Diagnosis not present

## 2023-09-17 DIAGNOSIS — M9712XA Periprosthetic fracture around internal prosthetic left knee joint, initial encounter: Secondary | ICD-10-CM | POA: Diagnosis not present

## 2023-09-17 DIAGNOSIS — R0902 Hypoxemia: Secondary | ICD-10-CM | POA: Diagnosis not present

## 2023-09-17 DIAGNOSIS — Z471 Aftercare following joint replacement surgery: Secondary | ICD-10-CM | POA: Diagnosis not present

## 2023-09-17 DIAGNOSIS — Z043 Encounter for examination and observation following other accident: Secondary | ICD-10-CM | POA: Diagnosis not present

## 2023-09-17 DIAGNOSIS — S72302A Unspecified fracture of shaft of left femur, initial encounter for closed fracture: Secondary | ICD-10-CM | POA: Diagnosis not present

## 2023-09-17 DIAGNOSIS — R338 Other retention of urine: Secondary | ICD-10-CM | POA: Diagnosis not present

## 2023-09-17 DIAGNOSIS — K5900 Constipation, unspecified: Secondary | ICD-10-CM | POA: Diagnosis not present

## 2023-09-17 DIAGNOSIS — J9811 Atelectasis: Secondary | ICD-10-CM | POA: Diagnosis not present

## 2023-09-17 DIAGNOSIS — E785 Hyperlipidemia, unspecified: Secondary | ICD-10-CM | POA: Diagnosis not present

## 2023-09-17 DIAGNOSIS — Z96652 Presence of left artificial knee joint: Secondary | ICD-10-CM | POA: Diagnosis not present

## 2023-09-17 DIAGNOSIS — G894 Chronic pain syndrome: Secondary | ICD-10-CM | POA: Diagnosis not present

## 2023-09-17 MED ORDER — OXYCODONE-ACETAMINOPHEN 5-325 MG PO TABS: 1.0000 | ORAL_TABLET | ORAL | 0 refills | Status: DC | PRN

## 2023-09-17 MED ORDER — OXYCODONE-ACETAMINOPHEN 5-325 MG PO TABS
1.0000 | ORAL_TABLET | Freq: Once | ORAL | Status: AC
  Administered 2023-09-17: 1 via ORAL
  Filled 2023-09-17: qty 1

## 2023-09-17 MED ORDER — LIDOCAINE 5 % EX PTCH: 1.0000 | MEDICATED_PATCH | Freq: Two times a day (BID) | CUTANEOUS | 0 refills | Status: DC

## 2023-09-17 NOTE — ED Provider Notes (Signed)
[facility] Provider Note    Event Date/Time   First MD Initiated Contact with Patient 09/17/23 0915     (approximate)   History   Chief Complaint Hip Pain   HPI  Pamela Ball is a 77 y.o. female with past medical history of hypertension, stroke, COPD, bipolar disorder, and chronic pain syndrome who presents to the ED complaining of hip pain.  Patient reports that she has had about 1 week of pain in her left hip which is worse when she moves it or attempts to walk.  She has had difficulty bearing weight on the hip due to the pain, states that she fell about 2 weeks ago onto her left side.  She has been taking Tylenol without relief, states she saw her PCP for this and was told that x-rays were unremarkable.  She denies any pain in her back and does not have any pain shooting down the left leg.     Physical Exam   Triage Vital Signs: ED Triage Vitals [09/17/23 0905]  Encounter Vitals Group     BP (!) 154/75     Systolic BP Percentile      Diastolic BP Percentile      Pulse Rate 87     Resp 16     Temp 98.5 F (36.9 C)     Temp Source Oral     SpO2 94 %     Weight 91 lb (41.3 kg)     Height 4\' 10"  (1.473 m)     Head Circumference      Peak Flow      Pain Score 8     Pain Loc      Pain Education      Exclude from Growth Chart     Most recent vital signs: Vitals:   09/17/23 0905 09/17/23 1116  BP: (!) 154/75 (!) 136/43  Pulse: 87 80  Resp: 16 16  Temp: 98.5 F (36.9 C) 98 F (36.7 C)  SpO2: 94% 94%    Constitutional: Alert and oriented. Eyes: Conjunctivae are normal. Head: Atraumatic. Nose: No congestion/rhinnorhea. Mouth/Throat: Mucous membranes are moist.  Cardiovascular: Normal rate, regular rhythm. Grossly normal heart sounds.  2+ radial and DP pulses bilaterally. Respiratory: Normal respiratory effort.  No retractions. Lungs CTAB. Gastrointestinal: Soft and nontender. No distention. Musculoskeletal: Pain with range of  motion of left hip, no tenderness to palpation at the left knee or ankle. Neurologic:  Normal speech and language. No gross focal neurologic deficits are appreciated.    ED Results / Procedures / Treatments   Labs (all labs ordered are listed, but only abnormal results are displayed) Labs Reviewed - No data to display  RADIOLOGY Left hip x-ray reviewed and interpreted by me with no fracture or dislocation.  PROCEDURES:  Critical Care performed: No  Procedures   MEDICATIONS ORDERED IN ED: Medications  oxyCODONE-acetaminophen (PERCOCET/ROXICET) 5-325 MG per tablet 1 tablet (1 tablet Oral Given 09/17/23 1003)     IMPRESSION / MDM / ASSESSMENT AND PLAN / ED COURSE  I reviewed the triage vital signs and the nursing notes.                              76 y.o. female with past medical history of hypertension, COPD, stroke, chronic pain syndrome, and bipolar disorder who presents to the ED with 1 week of hip pain after a fall 2 weeks ago.  Patient's  presentation is most consistent with acute complicated illness / injury requiring diagnostic workup.  Differential diagnosis includes, but is not limited to, fracture, dislocation, contusion.  Patient nontoxic-appearing and in no acute distress, vital signs are unremarkable.  She is neurovascularly intact to her left lower extremity with no obvious deformity.  X-ray imaging is unremarkable, due to concern for occult fracture we will further assess with CT imaging and treat symptomatically with Percocet.  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  In further discussion with spouse, it seems patient has been dealing with hip pain intermittently for greater than a year.  She seems to be flexing and extending at the hip with minimal difficulty following pain medication, and I doubt occult fracture necessitating MRI imaging.  Patient appropriate for discharge home, will  refer back to orthopedics and husband also encouraged to have her follow-up with PCP for PT.  They were counseled to return to the ED for new or worsening symptoms, patient and husband agree with plan.      FINAL CLINICAL IMPRESSION(S) / ED DIAGNOSES   Final diagnoses:  Left hip pain     Rx / DC Orders   ED Discharge Orders          Ordered    oxyCODONE-acetaminophen (PERCOCET) 5-325 MG tablet  Every 4 hours PRN        09/17/23 1418    lidocaine (LIDODERM) 5 %  Every 12 hours        09/17/23 1418             Note:  This document was prepared using Dragon voice recognition software and may include unintentional dictation errors.   Chesley Noon, MD 09/17/23 [phone]

## 2023-09-17 NOTE — ED Notes (Signed)
See triage note  States she fell couple of weeks ago  Landed on left side  Having pain to left hip and femur area States she was seen and had x-rays   She was told they were negative  Conts to have pain

## 2023-09-17 NOTE — ED Triage Notes (Signed)
Pt reports she tripped and fell 2 weeks ago and since then has been having pain to left hip. States she had xray and it was neg. At [facility]

## 2023-09-17 NOTE — ED Notes (Addendum)
Patient transported to X-ray 

## 2023-09-27 ENCOUNTER — Other Ambulatory Visit: Payer: Self-pay

## 2023-09-27 ENCOUNTER — Emergency Department
Admission: EM | Admit: 2023-09-27 | Discharge: 2023-09-27 | Disposition: A | Discharge status: Home / Self Care | Payer: Medicare Other | Attending: Emergency Medicine | Admitting: Emergency Medicine

## 2023-09-27 ENCOUNTER — Encounter: Payer: Self-pay | Admitting: Emergency Medicine

## 2023-09-27 DIAGNOSIS — N390 Urinary tract infection, site not specified: Secondary | ICD-10-CM | POA: Diagnosis not present

## 2023-09-27 DIAGNOSIS — T83091A Other mechanical complication of indwelling urethral catheter, initial encounter: Secondary | ICD-10-CM | POA: Insufficient documentation

## 2023-09-27 DIAGNOSIS — T83021A Displacement of indwelling urethral catheter, initial encounter: Secondary | ICD-10-CM

## 2023-09-27 DIAGNOSIS — K219 Gastro-esophageal reflux disease without esophagitis: Secondary | ICD-10-CM | POA: Diagnosis not present

## 2023-09-27 DIAGNOSIS — H548 Legal blindness, as defined in USA: Secondary | ICD-10-CM | POA: Diagnosis not present

## 2023-09-27 DIAGNOSIS — J449 Chronic obstructive pulmonary disease, unspecified: Secondary | ICD-10-CM | POA: Diagnosis not present

## 2023-09-27 DIAGNOSIS — G8929 Other chronic pain: Secondary | ICD-10-CM | POA: Diagnosis not present

## 2023-09-27 DIAGNOSIS — Z9181 History of falling: Secondary | ICD-10-CM | POA: Diagnosis not present

## 2023-09-27 DIAGNOSIS — Z993 Dependence on wheelchair: Secondary | ICD-10-CM | POA: Diagnosis not present

## 2023-09-27 DIAGNOSIS — I351 Nonrheumatic aortic (valve) insufficiency: Secondary | ICD-10-CM | POA: Diagnosis not present

## 2023-09-27 DIAGNOSIS — Z7982 Long term (current) use of aspirin: Secondary | ICD-10-CM | POA: Diagnosis not present

## 2023-09-27 DIAGNOSIS — Z604 Social exclusion and rejection: Secondary | ICD-10-CM | POA: Diagnosis not present

## 2023-09-27 DIAGNOSIS — T83021D Displacement of indwelling urethral catheter, subsequent encounter: Secondary | ICD-10-CM | POA: Diagnosis not present

## 2023-09-27 DIAGNOSIS — E785 Hyperlipidemia, unspecified: Secondary | ICD-10-CM | POA: Diagnosis not present

## 2023-09-27 DIAGNOSIS — K5900 Constipation, unspecified: Secondary | ICD-10-CM | POA: Diagnosis not present

## 2023-09-27 DIAGNOSIS — I10 Essential (primary) hypertension: Secondary | ICD-10-CM | POA: Diagnosis not present

## 2023-09-27 DIAGNOSIS — J432 Centrilobular emphysema: Secondary | ICD-10-CM | POA: Diagnosis not present

## 2023-09-27 DIAGNOSIS — Z556 Problems related to health literacy: Secondary | ICD-10-CM | POA: Diagnosis not present

## 2023-09-27 DIAGNOSIS — Z8673 Personal history of transient ischemic attack (TIA), and cerebral infarction without residual deficits: Secondary | ICD-10-CM | POA: Diagnosis not present

## 2023-09-27 DIAGNOSIS — S72322D Displaced transverse fracture of shaft of left femur, subsequent encounter for closed fracture with routine healing: Secondary | ICD-10-CM | POA: Diagnosis not present

## 2023-09-27 DIAGNOSIS — Z7901 Long term (current) use of anticoagulants: Secondary | ICD-10-CM | POA: Diagnosis not present

## 2023-09-27 LAB — URINALYSIS, ROUTINE W REFLEX MICROSCOPIC
Bilirubin Urine: NEGATIVE
Glucose, UA: NEGATIVE mg/dL
Hgb urine dipstick: NEGATIVE
Ketones, ur: NEGATIVE mg/dL
Nitrite: NEGATIVE
Protein, ur: NEGATIVE mg/dL
Specific Gravity, Urine: 1.013 (ref 1.005–1.030)
WBC, UA: >50 WBC/hpf (ref 0–5)
pH: 5 (ref 5.0–8.0)

## 2023-09-27 MED ORDER — CEPHALEXIN 500 MG PO CAPS: 500.0000 mg | ORAL_CAPSULE | Freq: Three times a day (TID) | ORAL | 0 refills | Status: AC

## 2023-09-27 NOTE — ED Provider Notes (Signed)
[facility] Provider Note    Event Date/Time   First MD Initiated Contact with Patient 09/27/23 [phone]     (approximate)   History   Catheter Replacement   HPI  Pamela Ball is a 77 y.o. female with a history of bipolar disorder, depression, COPD, recent left femur fracture who presents because she pulled out her chronic Foley catheter yesterday.  She also peeled off her surgical dressing.  Overall feels well and has no physical complaints at this time     Physical Exam   Triage Vital Signs: ED Triage Vitals  Encounter Vitals Group     BP 09/27/23 0816 (!) 137/52     Systolic BP Percentile --      Diastolic BP Percentile --      Pulse Rate 09/27/23 0816 67     Resp 09/27/23 0816 16     Temp 09/27/23 0816 98.1 F (36.7 C)     Temp Source 09/27/23 0816 Oral     SpO2 09/27/23 0816 98 %     Weight 09/27/23 0815 41.3 kg (91 lb)     Height 09/27/23 0815 1.473 m (4\' 10" )     Head Circumference --      Peak Flow --      Pain Score 09/27/23 0815 0     Pain Loc --      Pain Education --      Exclude from Growth Chart --     Most recent vital signs: Vitals:   09/27/23 0816  BP: (!) 137/52  Pulse: 67  Resp: 16  Temp: 98.1 F (36.7 C)  SpO2: 98%     General: Awake, no distress.  CV:  Good peripheral perfusion.  Resp:  Normal effort.  Abd:  No distention.  Other:  Left thigh: Surgical wound with staples intact, clean dry, well-appearing no dehiscence, no redness   ED Results / Procedures / Treatments   Labs (all labs ordered are listed, but only abnormal results are displayed) Labs Reviewed  URINALYSIS, ROUTINE W REFLEX MICROSCOPIC - Abnormal; Notable for the following components:      Result Value   Color, Urine YELLOW (*)    APPearance HAZY (*)    Leukocytes,Ua LARGE (*)    Bacteria, UA MANY (*)    Non Squamous Epithelial PRESENT (*)    All other components within normal limits      EKG     RADIOLOGY     PROCEDURES:  Critical Care performed:   Procedures   MEDICATIONS ORDERED IN ED: Medications - No data to display   IMPRESSION / MDM / ASSESSMENT AND PLAN / ED COURSE  I reviewed the triage vital signs and the nursing notes. Patient's presentation is most consistent with acute complicated illness / injury requiring diagnostic workup.  Patient here because of displaced urinary catheter, will have nurse replace, check urinalysis.  Will address surgical wound  Urinalysis is suspicious for urinary tract infection, will start antibiotics, outpatient follow-up, no indication for admission      FINAL CLINICAL IMPRESSION(S) / ED DIAGNOSES   Final diagnoses:  Displacement of Foley catheter, initial encounter (HCC)  Urinary tract infection without hematuria, site unspecified     Rx / DC Orders   ED Discharge Orders          Ordered    cephALEXin (KEFLEX) 500 MG capsule  3 times daily        09/27/23 0955  Note:  This document was prepared using Dragon voice recognition software and may include unintentional dictation errors.   Jene Every, MD 09/27/23 1101

## 2023-09-27 NOTE — ED Notes (Signed)
See triage note  Presents requesting to have her foley cath replaced  She had recent surgery to left  hip area  Staples intact  No redness or drainage noted   States she pulled out the foley because she thought it was not draining  corrected

## 2023-09-27 NOTE — ED Triage Notes (Signed)
Pt via POV from home. Pt pulled out her catheter yesterday because she did not want it in anymore. Pt has urinated since but here to get the catheter replaced. Denies pain. Pt is A&Ox4 and NAD

## 2023-09-28 DIAGNOSIS — Z7901 Long term (current) use of anticoagulants: Secondary | ICD-10-CM | POA: Diagnosis not present

## 2023-09-28 DIAGNOSIS — Z9181 History of falling: Secondary | ICD-10-CM | POA: Diagnosis not present

## 2023-09-28 DIAGNOSIS — K219 Gastro-esophageal reflux disease without esophagitis: Secondary | ICD-10-CM | POA: Diagnosis not present

## 2023-09-28 DIAGNOSIS — S72322D Displaced transverse fracture of shaft of left femur, subsequent encounter for closed fracture with routine healing: Secondary | ICD-10-CM | POA: Diagnosis not present

## 2023-09-28 DIAGNOSIS — I10 Essential (primary) hypertension: Secondary | ICD-10-CM | POA: Diagnosis not present

## 2023-09-28 DIAGNOSIS — N390 Urinary tract infection, site not specified: Secondary | ICD-10-CM | POA: Diagnosis not present

## 2023-09-28 DIAGNOSIS — G8929 Other chronic pain: Secondary | ICD-10-CM | POA: Diagnosis not present

## 2023-09-28 DIAGNOSIS — E785 Hyperlipidemia, unspecified: Secondary | ICD-10-CM | POA: Diagnosis not present

## 2023-09-28 DIAGNOSIS — H548 Legal blindness, as defined in USA: Secondary | ICD-10-CM | POA: Diagnosis not present

## 2023-09-28 DIAGNOSIS — T83021D Displacement of indwelling urethral catheter, subsequent encounter: Secondary | ICD-10-CM | POA: Diagnosis not present

## 2023-09-28 DIAGNOSIS — J432 Centrilobular emphysema: Secondary | ICD-10-CM | POA: Diagnosis not present

## 2023-09-28 DIAGNOSIS — Z7982 Long term (current) use of aspirin: Secondary | ICD-10-CM | POA: Diagnosis not present

## 2023-09-28 DIAGNOSIS — K5900 Constipation, unspecified: Secondary | ICD-10-CM | POA: Diagnosis not present

## 2023-09-28 DIAGNOSIS — Z993 Dependence on wheelchair: Secondary | ICD-10-CM | POA: Diagnosis not present

## 2023-09-28 DIAGNOSIS — Z604 Social exclusion and rejection: Secondary | ICD-10-CM | POA: Diagnosis not present

## 2023-09-28 DIAGNOSIS — Z8673 Personal history of transient ischemic attack (TIA), and cerebral infarction without residual deficits: Secondary | ICD-10-CM | POA: Diagnosis not present

## 2023-09-28 DIAGNOSIS — Z556 Problems related to health literacy: Secondary | ICD-10-CM | POA: Diagnosis not present

## 2023-09-28 DIAGNOSIS — I351 Nonrheumatic aortic (valve) insufficiency: Secondary | ICD-10-CM | POA: Diagnosis not present

## 2023-10-01 ENCOUNTER — Other Ambulatory Visit: Payer: Medicare Other

## 2023-10-01 ENCOUNTER — Telehealth: Payer: Self-pay | Admitting: Pharmacist

## 2023-10-01 NOTE — Telephone Encounter (Signed)
   Outreach Note  10/01/2023 Name: Pamela Ball MRN: [id number] DOB: 1946/05/11  Referred by: Smitty Cords, DO  Was unable to reach patient/caregiver via telephone today and unable to leave a message as no voicemail is setup   Follow Up Plan: Will collaborate with Care Guide to outreach to schedule follow up with me  Estelle Grumbles, PharmD, [facility] [phone]

## 2023-10-05 ENCOUNTER — Other Ambulatory Visit: Payer: Self-pay

## 2023-10-05 ENCOUNTER — Emergency Department
Admission: EM | Admit: 2023-10-05 | Discharge: 2023-10-05 | Disposition: A | Discharge status: Home / Self Care | Payer: Medicare Other | Attending: Emergency Medicine | Admitting: Emergency Medicine

## 2023-10-05 DIAGNOSIS — J449 Chronic obstructive pulmonary disease, unspecified: Secondary | ICD-10-CM | POA: Insufficient documentation

## 2023-10-05 DIAGNOSIS — Z5189 Encounter for other specified aftercare: Secondary | ICD-10-CM | POA: Insufficient documentation

## 2023-10-05 DIAGNOSIS — I10 Essential (primary) hypertension: Secondary | ICD-10-CM | POA: Diagnosis not present

## 2023-10-05 DIAGNOSIS — Z4789 Encounter for other orthopedic aftercare: Secondary | ICD-10-CM | POA: Diagnosis not present

## 2023-10-05 NOTE — ED Triage Notes (Signed)
Pt presents because she had a F/C that she wants removed. Pt also has staples in her LLE that has been present since 11/11 that needs to be removed.

## 2023-10-05 NOTE — ED Notes (Signed)
See triage note  Presents with husband  States she wants her foley cath removed and to have staples removed from left leg  Per family she miss appt last Wednesday  Unsure of ortho appt

## 2023-10-05 NOTE — ED Provider Notes (Signed)
[facility] Provider Note    None    (approximate)   History   Follow-up   HPI  Pamela Ball is a 77 y.o. female presents to the ED for follow-up of her surgery that was performed at [facility] on 09/18/2023.  Care everywhere shows that patient did have surgery for a femoral shaft fracture.  Patient had a follow-up appointment with her surgeon 2 days ago but missed that appointment.  She is requesting that her Foley catheter be removed and staples be taken out.  Patient has history of hypertension, bipolar disorder, chronic pain, GERD, COPD, glaucoma, history of CVA, non-STEMI.     Physical Exam   Triage Vital Signs: ED Triage Vitals  Encounter Vitals Group     BP 10/05/23 1206 (!) 122/48     Systolic BP Percentile --      Diastolic BP Percentile --      Pulse Rate 10/05/23 1206 96     Resp 10/05/23 1206 18     Temp 10/05/23 1206 98.4 F (36.9 C)     Temp Source 10/05/23 1206 Oral     SpO2 10/05/23 1206 100 %     Weight 10/05/23 1204 91 lb (41.3 kg)     Height 10/05/23 1204 4\' 10"  (1.473 m)     Head Circumference --      Peak Flow --      Pain Score 10/05/23 1204 0     Pain Loc --      Pain Education --      Exclude from Growth Chart --     Most recent vital signs: Vitals:   10/05/23 1206  BP: (!) 122/48  Pulse: 96  Resp: 18  Temp: 98.4 F (36.9 C)  SpO2: 100%     General: Awake, no distress.  CV:  Good peripheral perfusion.  Resp:  Normal effort.  Abd:  No distention.  Other:     ED Results / Procedures / Treatments   Labs (all labs ordered are listed, but only abnormal results are displayed) Labs Reviewed - No data to display     PROCEDURES:  Critical Care performed:   Procedures   MEDICATIONS ORDERED IN ED: Medications - No data to display   IMPRESSION / MDM / ASSESSMENT AND PLAN / ED COURSE  I reviewed the triage vital signs and the nursing notes.   Differential diagnosis includes, but is not limited to,  request for removal of Foley catheter, follow-up from surgical procedure done at [facility], wound check  77 year old female presents to the ED with request to have the Foley catheter removed and also staples from her left lower extremity where she had surgery at [facility] on 09/18/2023 for a femur fracture.  Husband states that she had an appointment 2 days ago but missed the appointment.  This appointment has been rescheduled.  When talking with the patient she does not ambulate to the bathroom well.  She still continues to use her wheelchair often for getting around.  I explained to them that she is still would need to follow-up with her surgeon at [facility].  The phone number for her surgeon was listed on her discharge papers for her to reschedule her appointment.  Her husband is on the impression that her next appointment is for December 11.  We also discussed the possibility of being on a waiting list for any cancellations.      Patient's presentation is most consistent with acute, uncomplicated illness.  FINAL CLINICAL IMPRESSION(S) / ED DIAGNOSES   Final diagnoses:  Encounter for aftercare     Rx / DC Orders   ED Discharge Orders     None        Note:  This document was prepared using Dragon voice recognition software and may include unintentional dictation errors.   Tommi Rumps, PA-C 10/05/23 1457    Pilar Jarvis, MD 10/05/23 [phone]

## 2023-10-05 NOTE — Discharge Instructions (Signed)
Call your surgeon at [facility] to see if you can be put on a waiting list for an earlier appointment.  Phone [phone].   You will need to discuss removal of Foley catheter and your staple removal with your surgeon.  Continue with your medication as prescribed by your doctor.

## 2023-10-08 DIAGNOSIS — S72322D Displaced transverse fracture of shaft of left femur, subsequent encounter for closed fracture with routine healing: Secondary | ICD-10-CM | POA: Diagnosis not present

## 2023-10-08 DIAGNOSIS — S72322A Displaced transverse fracture of shaft of left femur, initial encounter for closed fracture: Secondary | ICD-10-CM | POA: Diagnosis not present

## 2023-10-09 ENCOUNTER — Telehealth: Payer: Self-pay

## 2023-10-09 DIAGNOSIS — T83021D Displacement of indwelling urethral catheter, subsequent encounter: Secondary | ICD-10-CM | POA: Diagnosis not present

## 2023-10-09 DIAGNOSIS — Z604 Social exclusion and rejection: Secondary | ICD-10-CM | POA: Diagnosis not present

## 2023-10-09 DIAGNOSIS — N390 Urinary tract infection, site not specified: Secondary | ICD-10-CM | POA: Diagnosis not present

## 2023-10-09 DIAGNOSIS — Z993 Dependence on wheelchair: Secondary | ICD-10-CM | POA: Diagnosis not present

## 2023-10-09 DIAGNOSIS — E785 Hyperlipidemia, unspecified: Secondary | ICD-10-CM | POA: Diagnosis not present

## 2023-10-09 DIAGNOSIS — Z7982 Long term (current) use of aspirin: Secondary | ICD-10-CM | POA: Diagnosis not present

## 2023-10-09 DIAGNOSIS — I351 Nonrheumatic aortic (valve) insufficiency: Secondary | ICD-10-CM | POA: Diagnosis not present

## 2023-10-09 DIAGNOSIS — S72322D Displaced transverse fracture of shaft of left femur, subsequent encounter for closed fracture with routine healing: Secondary | ICD-10-CM | POA: Diagnosis not present

## 2023-10-09 DIAGNOSIS — J432 Centrilobular emphysema: Secondary | ICD-10-CM | POA: Diagnosis not present

## 2023-10-09 DIAGNOSIS — Z7901 Long term (current) use of anticoagulants: Secondary | ICD-10-CM | POA: Diagnosis not present

## 2023-10-09 DIAGNOSIS — Z8673 Personal history of transient ischemic attack (TIA), and cerebral infarction without residual deficits: Secondary | ICD-10-CM | POA: Diagnosis not present

## 2023-10-09 DIAGNOSIS — H548 Legal blindness, as defined in USA: Secondary | ICD-10-CM | POA: Diagnosis not present

## 2023-10-09 DIAGNOSIS — G8929 Other chronic pain: Secondary | ICD-10-CM | POA: Diagnosis not present

## 2023-10-09 DIAGNOSIS — Z9181 History of falling: Secondary | ICD-10-CM | POA: Diagnosis not present

## 2023-10-09 DIAGNOSIS — K5900 Constipation, unspecified: Secondary | ICD-10-CM | POA: Diagnosis not present

## 2023-10-09 DIAGNOSIS — I10 Essential (primary) hypertension: Secondary | ICD-10-CM | POA: Diagnosis not present

## 2023-10-09 DIAGNOSIS — Z556 Problems related to health literacy: Secondary | ICD-10-CM | POA: Diagnosis not present

## 2023-10-09 DIAGNOSIS — K219 Gastro-esophageal reflux disease without esophagitis: Secondary | ICD-10-CM | POA: Diagnosis not present

## 2023-10-09 NOTE — Progress Notes (Signed)
  Care Coordination Note  10/09/2023 Name: Pamela Ball MRN: [id number] DOB: 1946-07-11  Pamela Ball is a 77 y.o. year old female who is a primary care patient of Smitty Cords, DO and is actively engaged with the Chronic Care Management team. I reached out to Arabella Merles by phone today to assist with re-scheduling a follow up visit with the Pharmacist  Follow up plan: Patient declines scheduling  follow up at this time  Appropriate care team members and provider have been notified via electronic communication.   Penne Lash , RMA     [facility]  [facility] Guide  Direct Dial: [phone]  Website: [email or web address]

## 2023-10-10 DIAGNOSIS — J432 Centrilobular emphysema: Secondary | ICD-10-CM | POA: Diagnosis not present

## 2023-10-10 DIAGNOSIS — H548 Legal blindness, as defined in USA: Secondary | ICD-10-CM | POA: Diagnosis not present

## 2023-10-10 DIAGNOSIS — G8929 Other chronic pain: Secondary | ICD-10-CM | POA: Diagnosis not present

## 2023-10-10 DIAGNOSIS — S72322D Displaced transverse fracture of shaft of left femur, subsequent encounter for closed fracture with routine healing: Secondary | ICD-10-CM | POA: Diagnosis not present

## 2023-10-10 DIAGNOSIS — Z604 Social exclusion and rejection: Secondary | ICD-10-CM | POA: Diagnosis not present

## 2023-10-10 DIAGNOSIS — N390 Urinary tract infection, site not specified: Secondary | ICD-10-CM | POA: Diagnosis not present

## 2023-10-10 DIAGNOSIS — Z7901 Long term (current) use of anticoagulants: Secondary | ICD-10-CM | POA: Diagnosis not present

## 2023-10-10 DIAGNOSIS — K5900 Constipation, unspecified: Secondary | ICD-10-CM | POA: Diagnosis not present

## 2023-10-10 DIAGNOSIS — I10 Essential (primary) hypertension: Secondary | ICD-10-CM | POA: Diagnosis not present

## 2023-10-10 DIAGNOSIS — I351 Nonrheumatic aortic (valve) insufficiency: Secondary | ICD-10-CM | POA: Diagnosis not present

## 2023-10-10 DIAGNOSIS — T83021D Displacement of indwelling urethral catheter, subsequent encounter: Secondary | ICD-10-CM | POA: Diagnosis not present

## 2023-10-10 DIAGNOSIS — Z9181 History of falling: Secondary | ICD-10-CM | POA: Diagnosis not present

## 2023-10-10 DIAGNOSIS — Z993 Dependence on wheelchair: Secondary | ICD-10-CM | POA: Diagnosis not present

## 2023-10-10 DIAGNOSIS — Z8673 Personal history of transient ischemic attack (TIA), and cerebral infarction without residual deficits: Secondary | ICD-10-CM | POA: Diagnosis not present

## 2023-10-10 DIAGNOSIS — Z556 Problems related to health literacy: Secondary | ICD-10-CM | POA: Diagnosis not present

## 2023-10-10 DIAGNOSIS — Z7982 Long term (current) use of aspirin: Secondary | ICD-10-CM | POA: Diagnosis not present

## 2023-10-10 DIAGNOSIS — E785 Hyperlipidemia, unspecified: Secondary | ICD-10-CM | POA: Diagnosis not present

## 2023-10-10 DIAGNOSIS — K219 Gastro-esophageal reflux disease without esophagitis: Secondary | ICD-10-CM | POA: Diagnosis not present

## 2023-10-15 DIAGNOSIS — N390 Urinary tract infection, site not specified: Secondary | ICD-10-CM | POA: Diagnosis not present

## 2023-10-15 DIAGNOSIS — T83021D Displacement of indwelling urethral catheter, subsequent encounter: Secondary | ICD-10-CM | POA: Diagnosis not present

## 2023-10-15 DIAGNOSIS — K5900 Constipation, unspecified: Secondary | ICD-10-CM | POA: Diagnosis not present

## 2023-10-15 DIAGNOSIS — S72322D Displaced transverse fracture of shaft of left femur, subsequent encounter for closed fracture with routine healing: Secondary | ICD-10-CM | POA: Diagnosis not present

## 2023-10-15 DIAGNOSIS — J432 Centrilobular emphysema: Secondary | ICD-10-CM | POA: Diagnosis not present

## 2023-10-15 DIAGNOSIS — Z556 Problems related to health literacy: Secondary | ICD-10-CM | POA: Diagnosis not present

## 2023-10-15 DIAGNOSIS — G8929 Other chronic pain: Secondary | ICD-10-CM | POA: Diagnosis not present

## 2023-10-15 DIAGNOSIS — I351 Nonrheumatic aortic (valve) insufficiency: Secondary | ICD-10-CM | POA: Diagnosis not present

## 2023-10-15 DIAGNOSIS — Z7901 Long term (current) use of anticoagulants: Secondary | ICD-10-CM | POA: Diagnosis not present

## 2023-10-15 DIAGNOSIS — Z993 Dependence on wheelchair: Secondary | ICD-10-CM | POA: Diagnosis not present

## 2023-10-15 DIAGNOSIS — I10 Essential (primary) hypertension: Secondary | ICD-10-CM | POA: Diagnosis not present

## 2023-10-15 DIAGNOSIS — Z7982 Long term (current) use of aspirin: Secondary | ICD-10-CM | POA: Diagnosis not present

## 2023-10-15 DIAGNOSIS — Z604 Social exclusion and rejection: Secondary | ICD-10-CM | POA: Diagnosis not present

## 2023-10-15 DIAGNOSIS — H548 Legal blindness, as defined in USA: Secondary | ICD-10-CM | POA: Diagnosis not present

## 2023-10-15 DIAGNOSIS — E785 Hyperlipidemia, unspecified: Secondary | ICD-10-CM | POA: Diagnosis not present

## 2023-10-15 DIAGNOSIS — K219 Gastro-esophageal reflux disease without esophagitis: Secondary | ICD-10-CM | POA: Diagnosis not present

## 2023-10-15 DIAGNOSIS — Z8673 Personal history of transient ischemic attack (TIA), and cerebral infarction without residual deficits: Secondary | ICD-10-CM | POA: Diagnosis not present

## 2023-10-15 DIAGNOSIS — Z9181 History of falling: Secondary | ICD-10-CM | POA: Diagnosis not present

## 2023-10-17 DIAGNOSIS — J432 Centrilobular emphysema: Secondary | ICD-10-CM | POA: Diagnosis not present

## 2023-10-17 DIAGNOSIS — K219 Gastro-esophageal reflux disease without esophagitis: Secondary | ICD-10-CM | POA: Diagnosis not present

## 2023-10-17 DIAGNOSIS — Z993 Dependence on wheelchair: Secondary | ICD-10-CM | POA: Diagnosis not present

## 2023-10-17 DIAGNOSIS — K5900 Constipation, unspecified: Secondary | ICD-10-CM | POA: Diagnosis not present

## 2023-10-17 DIAGNOSIS — Z8673 Personal history of transient ischemic attack (TIA), and cerebral infarction without residual deficits: Secondary | ICD-10-CM | POA: Diagnosis not present

## 2023-10-17 DIAGNOSIS — Z7982 Long term (current) use of aspirin: Secondary | ICD-10-CM | POA: Diagnosis not present

## 2023-10-17 DIAGNOSIS — T83021D Displacement of indwelling urethral catheter, subsequent encounter: Secondary | ICD-10-CM | POA: Diagnosis not present

## 2023-10-17 DIAGNOSIS — H548 Legal blindness, as defined in USA: Secondary | ICD-10-CM | POA: Diagnosis not present

## 2023-10-17 DIAGNOSIS — S72322D Displaced transverse fracture of shaft of left femur, subsequent encounter for closed fracture with routine healing: Secondary | ICD-10-CM | POA: Diagnosis not present

## 2023-10-17 DIAGNOSIS — Z7901 Long term (current) use of anticoagulants: Secondary | ICD-10-CM | POA: Diagnosis not present

## 2023-10-17 DIAGNOSIS — I10 Essential (primary) hypertension: Secondary | ICD-10-CM | POA: Diagnosis not present

## 2023-10-17 DIAGNOSIS — E785 Hyperlipidemia, unspecified: Secondary | ICD-10-CM | POA: Diagnosis not present

## 2023-10-17 DIAGNOSIS — I351 Nonrheumatic aortic (valve) insufficiency: Secondary | ICD-10-CM | POA: Diagnosis not present

## 2023-10-17 DIAGNOSIS — Z604 Social exclusion and rejection: Secondary | ICD-10-CM | POA: Diagnosis not present

## 2023-10-17 DIAGNOSIS — Z556 Problems related to health literacy: Secondary | ICD-10-CM | POA: Diagnosis not present

## 2023-10-17 DIAGNOSIS — N390 Urinary tract infection, site not specified: Secondary | ICD-10-CM | POA: Diagnosis not present

## 2023-10-17 DIAGNOSIS — G8929 Other chronic pain: Secondary | ICD-10-CM | POA: Diagnosis not present

## 2023-10-17 DIAGNOSIS — Z9181 History of falling: Secondary | ICD-10-CM | POA: Diagnosis not present

## 2023-10-18 DIAGNOSIS — Z885 Allergy status to narcotic agent status: Secondary | ICD-10-CM | POA: Diagnosis not present

## 2023-10-18 DIAGNOSIS — Z8673 Personal history of transient ischemic attack (TIA), and cerebral infarction without residual deficits: Secondary | ICD-10-CM | POA: Diagnosis not present

## 2023-10-18 DIAGNOSIS — Z556 Problems related to health literacy: Secondary | ICD-10-CM | POA: Diagnosis not present

## 2023-10-18 DIAGNOSIS — E785 Hyperlipidemia, unspecified: Secondary | ICD-10-CM | POA: Diagnosis not present

## 2023-10-18 DIAGNOSIS — Z7982 Long term (current) use of aspirin: Secondary | ICD-10-CM | POA: Diagnosis not present

## 2023-10-18 DIAGNOSIS — Z91048 Other nonmedicinal substance allergy status: Secondary | ICD-10-CM | POA: Diagnosis not present

## 2023-10-18 DIAGNOSIS — I252 Old myocardial infarction: Secondary | ICD-10-CM | POA: Diagnosis not present

## 2023-10-18 DIAGNOSIS — Z79899 Other long term (current) drug therapy: Secondary | ICD-10-CM | POA: Diagnosis not present

## 2023-10-18 DIAGNOSIS — R339 Retention of urine, unspecified: Secondary | ICD-10-CM | POA: Diagnosis not present

## 2023-10-19 ENCOUNTER — Ambulatory Visit: Payer: Self-pay

## 2023-10-19 DIAGNOSIS — Z604 Social exclusion and rejection: Secondary | ICD-10-CM | POA: Diagnosis not present

## 2023-10-19 DIAGNOSIS — E785 Hyperlipidemia, unspecified: Secondary | ICD-10-CM | POA: Diagnosis not present

## 2023-10-19 DIAGNOSIS — S72042D Displaced fracture of base of neck of left femur, subsequent encounter for closed fracture with routine healing: Secondary | ICD-10-CM

## 2023-10-19 DIAGNOSIS — Z556 Problems related to health literacy: Secondary | ICD-10-CM | POA: Diagnosis not present

## 2023-10-19 DIAGNOSIS — G8929 Other chronic pain: Secondary | ICD-10-CM | POA: Diagnosis not present

## 2023-10-19 DIAGNOSIS — Z993 Dependence on wheelchair: Secondary | ICD-10-CM | POA: Diagnosis not present

## 2023-10-19 DIAGNOSIS — Z8673 Personal history of transient ischemic attack (TIA), and cerebral infarction without residual deficits: Secondary | ICD-10-CM | POA: Diagnosis not present

## 2023-10-19 DIAGNOSIS — Z9181 History of falling: Secondary | ICD-10-CM | POA: Diagnosis not present

## 2023-10-19 DIAGNOSIS — I10 Essential (primary) hypertension: Secondary | ICD-10-CM | POA: Diagnosis not present

## 2023-10-19 DIAGNOSIS — H548 Legal blindness, as defined in USA: Secondary | ICD-10-CM | POA: Diagnosis not present

## 2023-10-19 DIAGNOSIS — K219 Gastro-esophageal reflux disease without esophagitis: Secondary | ICD-10-CM | POA: Diagnosis not present

## 2023-10-19 DIAGNOSIS — I351 Nonrheumatic aortic (valve) insufficiency: Secondary | ICD-10-CM | POA: Diagnosis not present

## 2023-10-19 DIAGNOSIS — Z7901 Long term (current) use of anticoagulants: Secondary | ICD-10-CM | POA: Diagnosis not present

## 2023-10-19 DIAGNOSIS — K5900 Constipation, unspecified: Secondary | ICD-10-CM | POA: Diagnosis not present

## 2023-10-19 DIAGNOSIS — Z7982 Long term (current) use of aspirin: Secondary | ICD-10-CM | POA: Diagnosis not present

## 2023-10-19 DIAGNOSIS — N390 Urinary tract infection, site not specified: Secondary | ICD-10-CM | POA: Diagnosis not present

## 2023-10-19 DIAGNOSIS — M79605 Pain in left leg: Secondary | ICD-10-CM

## 2023-10-19 DIAGNOSIS — M170 Bilateral primary osteoarthritis of knee: Secondary | ICD-10-CM

## 2023-10-19 DIAGNOSIS — J432 Centrilobular emphysema: Secondary | ICD-10-CM | POA: Diagnosis not present

## 2023-10-19 DIAGNOSIS — T83021D Displacement of indwelling urethral catheter, subsequent encounter: Secondary | ICD-10-CM | POA: Diagnosis not present

## 2023-10-19 DIAGNOSIS — S72322D Displaced transverse fracture of shaft of left femur, subsequent encounter for closed fracture with routine healing: Secondary | ICD-10-CM | POA: Diagnosis not present

## 2023-10-19 MED ORDER — HYDROCODONE-ACETAMINOPHEN 5-325 MG PO TABS: 1.0000 | ORAL_TABLET | ORAL | 0 refills | Status: DC | PRN

## 2023-10-19 NOTE — Telephone Encounter (Signed)
     Chief Complaint: Fracture left leg 3 weeks ago per husband. Asking for pain medication. Declines OV due to decreased mobility. Symptoms: Severe pain Frequency: 3 weeks Pertinent Negatives: Patient denies  Disposition: [] ED /[] Urgent Care (no appt availability in office) / [] Appointment(In office/virtual)/ []  [place] [facility]/ [] Home Care/ [] Refused Recommended Disposition /[] [place] Mobile [facility]/ [x]  Follow-up with PCP Additional Notes: Please advise husband.  Reason for Disposition  [1] MODERATE pain (e.g., interferes with normal activities, limping) AND [2] present > 3 days  Answer Assessment - Initial Assessment Questions 1. ONSET: "When did the pain start?"      3 weeks 2. LOCATION: "Where is the pain located?"      Left leg 3. PAIN: "How bad is the pain?"    (Scale 1-10; or mild, moderate, severe)   -  MILD (1-3): doesn't interfere with normal activities    -  MODERATE (4-7): interferes with normal activities (e.g., work or school) or awakens from sleep, limping    -  SEVERE (8-10): excruciating pain, unable to do any normal activities, unable to walk     Severe 4. WORK OR EXERCISE: "Has there been any recent work or exercise that involved this part of the body?"      Fracture 5. CAUSE: "What do you think is causing the leg pain?"     Fracture 6. OTHER SYMPTOMS: "Do you have any other symptoms?" (e.g., chest pain, back pain, breathing difficulty, swelling, rash, fever, numbness, weakness)     No 7. PREGNANCY: "Is there any chance you are pregnant?" "When was your last menstrual period?"     No  Protocols used: Leg Pain-A-AH

## 2023-10-19 NOTE — Addendum Note (Signed)
Addended by: Smitty Cords on: 10/19/2023 02:34 PM   Modules accepted: Orders

## 2023-10-19 NOTE — Telephone Encounter (Signed)
Correct. Thank you for the triage and contacting them.  I checked prescribing database, last time I prescribed pain medication for her it seems was April 2024. I may have missed something. But it shows orthopedic had been managing lately.  If they cannot get assistance from the orthopedics for this issue, then only other option I can see is if they schedule upcoming acute visit or follow-up next week, I would be able to provide a 5-7 day order of her pain medicine if needed to make it to that appointment.  I would need to know precisely how often she is needing the medication etc.  Pamela Pilar, DO [facility] 10/19/2023, 1:04 PM

## 2023-10-19 NOTE — Telephone Encounter (Signed)
I have managed her pain medicine previously and will now agree to re order med until she can return.  Ordered Hydrocodone-Acetaminophen 5/325mg  q 4 hr AS NEEDED #30 pills 5-7 day course  Keep apt next week  [name], DO [facility] 10/19/2023, 2:34 PM

## 2023-10-21 ENCOUNTER — Other Ambulatory Visit: Payer: Self-pay | Admitting: Family Medicine

## 2023-10-21 DIAGNOSIS — K219 Gastro-esophageal reflux disease without esophagitis: Secondary | ICD-10-CM

## 2023-10-22 NOTE — Telephone Encounter (Signed)
Requested by interface surescripts. Future visit in 3 days.  Requested Prescriptions  Pending Prescriptions Disp Refills   omeprazole (PRILOSEC) 20 MG capsule [Pharmacy Med Name: OMEPRAZOLE DR 20 MG CAPSULE] 90 capsule 1    Sig: TAKE 1 CAPSULE BY MOUTH EVERY DAY BEFORE BREAKFAST     Gastroenterology: Proton Pump Inhibitors Passed - 10/22/2023  4:12 PM      Passed - Valid encounter within last 12 months    Recent Outpatient Visits           1 month ago Acute pain of left lower extremity   [place] [facility] [place], Netta Neat, DO   2 months ago Benign hypertension   [place] [facility] [place], Jackelyn Poling, RPH-CPP   5 months ago Keratoacanthoma of lower leg   [place] [facility] Smitty Cords, DO   7 months ago Actinic keratosis   [place] [facility] Smitty Cords, DO   8 months ago Herpes zoster without complication   [place] [facility] [place], Netta Neat, DO       Future Appointments             In 3 days Althea Charon, Netta Neat, DO [facility]

## 2023-10-25 ENCOUNTER — Encounter: Payer: Self-pay | Admitting: Family Medicine

## 2023-10-25 ENCOUNTER — Ambulatory Visit (INDEPENDENT_AMBULATORY_CARE_PROVIDER_SITE_OTHER): Payer: Medicare Other | Admitting: Family Medicine

## 2023-10-25 ENCOUNTER — Ambulatory Visit: Payer: Medicare Other | Admitting: Family Medicine

## 2023-10-25 VITALS — BP 132/64 | HR 80

## 2023-10-25 DIAGNOSIS — N3001 Acute cystitis with hematuria: Secondary | ICD-10-CM

## 2023-10-25 DIAGNOSIS — B379 Candidiasis, unspecified: Secondary | ICD-10-CM

## 2023-10-25 DIAGNOSIS — M170 Bilateral primary osteoarthritis of knee: Secondary | ICD-10-CM

## 2023-10-25 DIAGNOSIS — S72042D Displaced fracture of base of neck of left femur, subsequent encounter for closed fracture with routine healing: Secondary | ICD-10-CM

## 2023-10-25 DIAGNOSIS — M25562 Pain in left knee: Secondary | ICD-10-CM | POA: Diagnosis not present

## 2023-10-25 DIAGNOSIS — M79605 Pain in left leg: Secondary | ICD-10-CM

## 2023-10-25 DIAGNOSIS — G8929 Other chronic pain: Secondary | ICD-10-CM

## 2023-10-25 MED ORDER — FLUCONAZOLE 150 MG PO TABS: ORAL_TABLET | ORAL | 1 refills | Status: DC

## 2023-10-25 MED ORDER — CIPROFLOXACIN HCL 500 MG PO TABS: 500.0000 mg | ORAL_TABLET | Freq: Two times a day (BID) | ORAL | 0 refills | Status: AC

## 2023-10-25 MED ORDER — HYDROCODONE-ACETAMINOPHEN 5-325 MG PO TABS: 1.0000 | ORAL_TABLET | ORAL | 0 refills | Status: DC | PRN

## 2023-10-25 NOTE — Patient Instructions (Addendum)
Thank you for coming to the office today.  You have a Urinary Tract Infection - this is very common, your symptoms are reassuring and you should get better within 1 week on the antibiotics  - Start Cipro 500mg  2 times daily for next 7 days, complete entire course, even if feeling better  Take Diflucan AFTER for yeast infection   Refilled pain medicine Hydrocodone pick up Friday 12/20  Work with Urologist as scheduled for the Bladder Dysfunction. Urinary retention.  Please schedule a Follow-up Appointment to: Return if symptoms worsen or fail to improve.  If you have any other questions or concerns, please feel free to call the office or send a message through MyChart. You may also schedule an earlier appointment if necessary.  Additionally, you may be receiving a survey about your experience at our office within a few days to 1 week by e-mail or mail. We value your feedback.  [name], DO [facility], [place]

## 2023-10-25 NOTE — Progress Notes (Signed)
Subjective:    Patient ID: Pamela Ball, female    DOB: 1946/06/22, 77 y.o.   MRN: [id number]  Pamela Ball is a 77 y.o. female presenting on 10/25/2023 for Hospitalization Follow-up   HPI  Discussed the use of AI scribe software for clinical note transcription with the patient, who gave verbal consent to proceed.   The patient, with a history of urinary retention and recent urological intervention, presents with complaints of dysuria, characterized by burning and stinging sensations, and slow urine flow. She also reports an odor associated with her urine. The patient was previously treated with a one-week course of Keflex approximately four weeks ago for a urinary infection. However, she continues to experience urinary symptoms, suggesting a possible unresolved infection or recurrence.  The patient also has a history of orthopedic issues, for which she recently had surgery. She reports that her stitches and staples have been removed and she is making progress in her recovery. However, she has been experiencing pain, for which she was prescribed hydrocodone. The patient finished her prescription and reports a continued need for pain management.  The patient is under the care of a urologist at [facility], who has been monitoring her post-void residual volumes. The patient is scheduled for a follow-up appointment with the urologist to further evaluate her urinary retention.         03/15/2023    9:33 AM 02/01/2023    8:51 AM 10/19/2022   10:39 AM  Depression screen PHQ 2/9  Decreased Interest 0 0 0  Down, Depressed, Hopeless 0 0 0  PHQ - 2 Score 0 0 0  Altered sleeping  0 3  Tired, decreased energy  0 0  Change in appetite  0 3  Feeling bad or failure about yourself   0 0  Trouble concentrating  0 0  Moving slowly or fidgety/restless  0 0  Suicidal thoughts  0 0  PHQ-9 Score  0 6  Difficult doing work/chores  Not difficult at all Not difficult at all       03/15/2023    9:33 AM  02/01/2023    8:51 AM 10/19/2022   10:39 AM 05/25/2022    9:05 AM  GAD 7 : Generalized Anxiety Score  Nervous, Anxious, on Edge 0 0 0 0  Control/stop worrying 0 0 0 0  Worry too much - different things 0 0 0 0  Trouble relaxing 0 0 0 0  Restless 0 0 0 0  Easily annoyed or irritable 0 0 0 0  Afraid - awful might happen 0 0 0 0  Total GAD 7 Score 0 0 0 0  Anxiety Difficulty  Not difficult at all Not difficult at all Not difficult at all    Social History   Tobacco Use   Smoking status: Never   Smokeless tobacco: Never  Vaping Use   Vaping status: Never Used  Substance Use Topics   Alcohol use: No    Comment: drink occasionally    Drug use: No    Review of Systems Per HPI unless specifically indicated above     Objective:    BP 132/64   Pulse 80   SpO2 93%   Wt Readings from Last 3 Encounters:  10/05/23 91 lb (41.3 kg)  09/27/23 91 lb (41.3 kg)  09/17/23 91 lb (41.3 kg)    Physical Exam Vitals and nursing note reviewed.  Constitutional:      General: She is not in acute distress.  Appearance: Normal appearance. She is well-developed. She is not diaphoretic.     Comments: Elderly frail 77 yr old female, comfortable, cooperative  HENT:     Head: Normocephalic and atraumatic.  Eyes:     General:        Right eye: No discharge.        Left eye: No discharge.     Conjunctiva/sclera: Conjunctivae normal.  Cardiovascular:     Rate and Rhythm: Normal rate.  Pulmonary:     Effort: Pulmonary effort is normal.  Musculoskeletal:     Comments: Sitting in wheelchair  Bilateral Hip flexion and extension intact without provoked pain.   Skin:    General: Skin is warm and dry.     Findings: Lesion (left lower extremity surgical scar healed, staples have already been removed) present. No erythema or rash.  Neurological:     Mental Status: She is alert and oriented to person, place, and time.  Psychiatric:        Mood and Affect: Mood normal.        Behavior: Behavior  normal.        Thought Content: Thought content normal.     Comments: Well groomed, good eye contact, normal speech and thoughts     Results for orders placed or performed during the hospital encounter of 09/27/23  Urinalysis, Routine w reflex microscopic -Urine, Catheterized   Collection Time: 09/27/23  9:15 AM  Result Value Ref Range   Color, Urine YELLOW (A) YELLOW   APPearance HAZY (A) CLEAR   Specific Gravity, Urine 1.013 1.005 - 1.030   pH 5.0 5.0 - 8.0   Glucose, UA NEGATIVE NEGATIVE mg/dL   Hgb urine dipstick NEGATIVE NEGATIVE   Bilirubin Urine NEGATIVE NEGATIVE   Ketones, ur NEGATIVE NEGATIVE mg/dL   Protein, ur NEGATIVE NEGATIVE mg/dL   Nitrite NEGATIVE NEGATIVE   Leukocytes,Ua LARGE (A) NEGATIVE   RBC / HPF 11-20 0 - 5 RBC/hpf   WBC, UA >50 0 - 5 WBC/hpf   Bacteria, UA MANY (A) NONE SEEN   Squamous Epithelial / HPF 0-5 0 - 5 /HPF   Mucus PRESENT    Hyaline Casts, UA PRESENT    Non Squamous Epithelial PRESENT (A) NONE SEEN      Assessment & Plan:   Problem List Items Addressed This Visit     Osteoarthritis of knees, bilateral   Relevant Medications   HYDROcodone-acetaminophen (NORCO/VICODIN) 5-325 MG tablet (Start on 10/26/2023)   Other Visit Diagnoses       Acute cystitis with hematuria    -  Primary   Relevant Medications   ciprofloxacin (CIPRO) 500 MG tablet     Antibiotic-induced yeast infection       Relevant Medications   fluconazole (DIFLUCAN) 150 MG tablet     Left leg pain       Relevant Medications   HYDROcodone-acetaminophen (NORCO/VICODIN) 5-325 MG tablet (Start on 10/26/2023)     Bilateral chronic knee pain       Relevant Medications   HYDROcodone-acetaminophen (NORCO/VICODIN) 5-325 MG tablet (Start on 10/26/2023)     Closed displaced basicervical fracture of left femur with routine healing, subsequent encounter       Relevant Medications   HYDROcodone-acetaminophen (NORCO/VICODIN) 5-325 MG tablet (Start on 10/26/2023)        Urinary  Tract Infection Reports difficulty urinating, odor, slow stream, burning, and stinging. History of recent antibiotic treatment with Keflex one month ago. No recent urine culture available.  -Start Ciprofloxacin twice  daily for 7 days. -Start Fluconazole for possible yeast infection, with a refill available if needed. -Continue follow-up with urologist for urinary retention.  Pain Management Reports completion of recent pain medication prescription, with ongoing need for pain management. -Refill Hydrocodone prescription, available for pick-up on 10/26/2023.  Orthopedic Recovery Recent removal of stitches and staples from hip surgery, reports progress in healing. -Continue current management and follow-up with orthopedics as scheduled.       No orders of the defined types were placed in this encounter.   Meds ordered this encounter  Medications   ciprofloxacin (CIPRO) 500 MG tablet    Sig: Take 1 tablet (500 mg total) by mouth 2 (two) times daily for 7 days.    Dispense:  14 tablet    Refill:  0   fluconazole (DIFLUCAN) 150 MG tablet    Sig: Take one tablet by mouth on Day 1. Repeat dose 2nd tablet on Day 3.    Dispense:  2 tablet    Refill:  1   HYDROcodone-acetaminophen (NORCO/VICODIN) 5-325 MG tablet    Sig: Take 1 tablet by mouth every 4 (four) hours as needed for moderate pain (pain score 4-6).    Dispense:  30 tablet    Refill:  0    First fill 10/26/23    Follow up plan: Return if symptoms worsen or fail to improve.   [name], DO [facility] 10/25/2023, 11:55 AM

## 2023-10-29 DIAGNOSIS — S72322D Displaced transverse fracture of shaft of left femur, subsequent encounter for closed fracture with routine healing: Secondary | ICD-10-CM | POA: Diagnosis not present

## 2023-10-29 DIAGNOSIS — I10 Essential (primary) hypertension: Secondary | ICD-10-CM | POA: Diagnosis not present

## 2023-10-29 DIAGNOSIS — K219 Gastro-esophageal reflux disease without esophagitis: Secondary | ICD-10-CM | POA: Diagnosis not present

## 2023-10-29 DIAGNOSIS — Z7982 Long term (current) use of aspirin: Secondary | ICD-10-CM | POA: Diagnosis not present

## 2023-10-29 DIAGNOSIS — Z7901 Long term (current) use of anticoagulants: Secondary | ICD-10-CM | POA: Diagnosis not present

## 2023-10-29 DIAGNOSIS — E785 Hyperlipidemia, unspecified: Secondary | ICD-10-CM | POA: Diagnosis not present

## 2023-10-29 DIAGNOSIS — Z8673 Personal history of transient ischemic attack (TIA), and cerebral infarction without residual deficits: Secondary | ICD-10-CM | POA: Diagnosis not present

## 2023-10-29 DIAGNOSIS — K5900 Constipation, unspecified: Secondary | ICD-10-CM | POA: Diagnosis not present

## 2023-10-29 DIAGNOSIS — I351 Nonrheumatic aortic (valve) insufficiency: Secondary | ICD-10-CM | POA: Diagnosis not present

## 2023-10-29 DIAGNOSIS — T83021D Displacement of indwelling urethral catheter, subsequent encounter: Secondary | ICD-10-CM | POA: Diagnosis not present

## 2023-10-29 DIAGNOSIS — G8929 Other chronic pain: Secondary | ICD-10-CM | POA: Diagnosis not present

## 2023-10-29 DIAGNOSIS — Z9181 History of falling: Secondary | ICD-10-CM | POA: Diagnosis not present

## 2023-10-29 DIAGNOSIS — Z604 Social exclusion and rejection: Secondary | ICD-10-CM | POA: Diagnosis not present

## 2023-10-29 DIAGNOSIS — N390 Urinary tract infection, site not specified: Secondary | ICD-10-CM | POA: Diagnosis not present

## 2023-10-29 DIAGNOSIS — Z993 Dependence on wheelchair: Secondary | ICD-10-CM | POA: Diagnosis not present

## 2023-10-29 DIAGNOSIS — J432 Centrilobular emphysema: Secondary | ICD-10-CM | POA: Diagnosis not present

## 2023-10-29 DIAGNOSIS — H548 Legal blindness, as defined in USA: Secondary | ICD-10-CM | POA: Diagnosis not present

## 2023-10-29 DIAGNOSIS — Z556 Problems related to health literacy: Secondary | ICD-10-CM | POA: Diagnosis not present

## 2023-10-29 NOTE — Home Health (Signed)
 Routine visit by SN for : General assessment and education,   Teachable caregiver in home during visit: Spouse  Education provided to patient/cg on: Education provided on the importance of taking antibiotic, side effects and to increase fluids.   Medication change since last visit: Client recently started on an antibiotic for UTI and diflucan  for yeast infection  SN discharge Planning: Working towards SN discharge  Plan for next visit: Assessment  Attempted to reach [place] [employer] to obtain the name of the social worker that stopped by a few weeks ago, unable to get through x 2.

## 2023-11-05 DIAGNOSIS — G8929 Other chronic pain: Secondary | ICD-10-CM | POA: Diagnosis not present

## 2023-11-05 DIAGNOSIS — Z9181 History of falling: Secondary | ICD-10-CM | POA: Diagnosis not present

## 2023-11-05 DIAGNOSIS — I10 Essential (primary) hypertension: Secondary | ICD-10-CM | POA: Diagnosis not present

## 2023-11-05 DIAGNOSIS — Z604 Social exclusion and rejection: Secondary | ICD-10-CM | POA: Diagnosis not present

## 2023-11-05 DIAGNOSIS — Z556 Problems related to health literacy: Secondary | ICD-10-CM | POA: Diagnosis not present

## 2023-11-05 DIAGNOSIS — S72322D Displaced transverse fracture of shaft of left femur, subsequent encounter for closed fracture with routine healing: Secondary | ICD-10-CM | POA: Diagnosis not present

## 2023-11-05 DIAGNOSIS — Z7982 Long term (current) use of aspirin: Secondary | ICD-10-CM | POA: Diagnosis not present

## 2023-11-05 DIAGNOSIS — E785 Hyperlipidemia, unspecified: Secondary | ICD-10-CM | POA: Diagnosis not present

## 2023-11-05 DIAGNOSIS — N390 Urinary tract infection, site not specified: Secondary | ICD-10-CM | POA: Diagnosis not present

## 2023-11-05 DIAGNOSIS — J432 Centrilobular emphysema: Secondary | ICD-10-CM | POA: Diagnosis not present

## 2023-11-05 DIAGNOSIS — I351 Nonrheumatic aortic (valve) insufficiency: Secondary | ICD-10-CM | POA: Diagnosis not present

## 2023-11-05 DIAGNOSIS — K219 Gastro-esophageal reflux disease without esophagitis: Secondary | ICD-10-CM | POA: Diagnosis not present

## 2023-11-05 DIAGNOSIS — T83021D Displacement of indwelling urethral catheter, subsequent encounter: Secondary | ICD-10-CM | POA: Diagnosis not present

## 2023-11-05 DIAGNOSIS — H548 Legal blindness, as defined in USA: Secondary | ICD-10-CM | POA: Diagnosis not present

## 2023-11-05 DIAGNOSIS — Z993 Dependence on wheelchair: Secondary | ICD-10-CM | POA: Diagnosis not present

## 2023-11-05 DIAGNOSIS — K5900 Constipation, unspecified: Secondary | ICD-10-CM | POA: Diagnosis not present

## 2023-11-05 DIAGNOSIS — Z8673 Personal history of transient ischemic attack (TIA), and cerebral infarction without residual deficits: Secondary | ICD-10-CM | POA: Diagnosis not present

## 2023-11-05 DIAGNOSIS — Z7901 Long term (current) use of anticoagulants: Secondary | ICD-10-CM | POA: Diagnosis not present

## 2023-11-06 NOTE — Home Health (Signed)
 Pamela Ball is a frail and elderly Client admitted to home health with Closed displaced transverse fracture of shaft of left femur requiring surgical repair on 09/18/23, with a non weight bearing status on left lower extremity. Client was discharged home with foley catheter due to urinary retention, this was removed and client is now voiding on her own. She was recently treated for a UTI and denies any current symptoms. Disease process education and medication management completed. She is an extremely high risk for falls and  will continue to work with PT. Next follow up appointment is 11/12/23 with Dr. Laurence Beers.

## 2023-11-08 DIAGNOSIS — H548 Legal blindness, as defined in USA: Secondary | ICD-10-CM | POA: Diagnosis not present

## 2023-11-08 DIAGNOSIS — K219 Gastro-esophageal reflux disease without esophagitis: Secondary | ICD-10-CM | POA: Diagnosis not present

## 2023-11-08 DIAGNOSIS — Z604 Social exclusion and rejection: Secondary | ICD-10-CM | POA: Diagnosis not present

## 2023-11-08 DIAGNOSIS — T83021D Displacement of indwelling urethral catheter, subsequent encounter: Secondary | ICD-10-CM | POA: Diagnosis not present

## 2023-11-08 DIAGNOSIS — I10 Essential (primary) hypertension: Secondary | ICD-10-CM | POA: Diagnosis not present

## 2023-11-08 DIAGNOSIS — J432 Centrilobular emphysema: Secondary | ICD-10-CM | POA: Diagnosis not present

## 2023-11-08 DIAGNOSIS — E785 Hyperlipidemia, unspecified: Secondary | ICD-10-CM | POA: Diagnosis not present

## 2023-11-08 DIAGNOSIS — I351 Nonrheumatic aortic (valve) insufficiency: Secondary | ICD-10-CM | POA: Diagnosis not present

## 2023-11-08 DIAGNOSIS — Z993 Dependence on wheelchair: Secondary | ICD-10-CM | POA: Diagnosis not present

## 2023-11-08 DIAGNOSIS — Z8673 Personal history of transient ischemic attack (TIA), and cerebral infarction without residual deficits: Secondary | ICD-10-CM | POA: Diagnosis not present

## 2023-11-08 DIAGNOSIS — Z7982 Long term (current) use of aspirin: Secondary | ICD-10-CM | POA: Diagnosis not present

## 2023-11-08 DIAGNOSIS — Z7901 Long term (current) use of anticoagulants: Secondary | ICD-10-CM | POA: Diagnosis not present

## 2023-11-08 DIAGNOSIS — G8929 Other chronic pain: Secondary | ICD-10-CM | POA: Diagnosis not present

## 2023-11-08 DIAGNOSIS — K5900 Constipation, unspecified: Secondary | ICD-10-CM | POA: Diagnosis not present

## 2023-11-08 DIAGNOSIS — Z9181 History of falling: Secondary | ICD-10-CM | POA: Diagnosis not present

## 2023-11-08 DIAGNOSIS — N390 Urinary tract infection, site not specified: Secondary | ICD-10-CM | POA: Diagnosis not present

## 2023-11-08 DIAGNOSIS — S72322D Displaced transverse fracture of shaft of left femur, subsequent encounter for closed fracture with routine healing: Secondary | ICD-10-CM | POA: Diagnosis not present

## 2023-11-08 DIAGNOSIS — Z556 Problems related to health literacy: Secondary | ICD-10-CM | POA: Diagnosis not present

## 2023-11-12 DIAGNOSIS — S72322D Displaced transverse fracture of shaft of left femur, subsequent encounter for closed fracture with routine healing: Secondary | ICD-10-CM | POA: Diagnosis not present

## 2023-11-12 DIAGNOSIS — S72402A Unspecified fracture of lower end of left femur, initial encounter for closed fracture: Secondary | ICD-10-CM | POA: Diagnosis not present

## 2023-11-12 DIAGNOSIS — S72322A Displaced transverse fracture of shaft of left femur, initial encounter for closed fracture: Secondary | ICD-10-CM | POA: Diagnosis not present

## 2023-11-12 NOTE — Progress Notes (Signed)
-------------------------------------------------------------------------------   Attestation signed by Laurence Prentice Levan, MD MPH at 11/12/23 [phone] Agree with resident assessment and plan as above except for following changes. Patient independently seen and evaluated.   Continue toe touch weight bearing on left lower extremity. Xrays demonstrated interval healing. RTC in 5 weeks with repeat left femur films  Prentice Laurence, MD Orthopaedic Trauma   -------------------------------------------------------------------------------  ORTHOPAEDIC TRAUMA SERVICE POST-OPERATIVE CLINIC NOTE  Diagnosis: Left peri-implant distal femur fracture Surgery: ORIF left distal femur  Date of Surgery: 09/18/23  Subjective: Patient presents for 8 week follow-up of the above aforementioned procedure. Overall her pain has been well controlled and she has been working on ROM exercises while being compliant with weightbearing restriction (WB for transfers only).   Objective:  Physical Exam: General: Well-nourished in no acute distress.  CV: Palpable pedal pulses. Head: Normocephalic and atraumatic Respiratory: Unlabored breathing and symmetric chest rise.  Psychiatric: Alert and oriented. Appropriate mood and affect. Musculoskeletal:  Left lower extremity: Incision well healed. Active and passive ROM 10-90. No varus/valgus instability.   Pertinent Imaging: I ordered and independently reviewed and interpreted x-rays of L femur which demonstrates interval callous formation when compared to prior radiographs. No hardware malposition or failure.   Assessment: This is a 78 y.o. female approximately 8 weeks status post ORIF of a left distal femur peri-implant fracture.  Overall she is doing well postoperatively and her plain radiographs demonstrate bony healing.  Plan:  -Continue toe-touch weightbearing, ROMAT -Follow-up 5 weeks  No follow-ups on file. X-rays to be ordered at next visit: Left femur   Patrcia Pun, MD  [facility] Orthopaedic Surgery, PGY-2 Pager: [phone]

## 2023-11-13 DIAGNOSIS — T83021D Displacement of indwelling urethral catheter, subsequent encounter: Secondary | ICD-10-CM | POA: Diagnosis not present

## 2023-11-13 DIAGNOSIS — Z7901 Long term (current) use of anticoagulants: Secondary | ICD-10-CM | POA: Diagnosis not present

## 2023-11-13 DIAGNOSIS — K219 Gastro-esophageal reflux disease without esophagitis: Secondary | ICD-10-CM | POA: Diagnosis not present

## 2023-11-13 DIAGNOSIS — N390 Urinary tract infection, site not specified: Secondary | ICD-10-CM | POA: Diagnosis not present

## 2023-11-13 DIAGNOSIS — Z556 Problems related to health literacy: Secondary | ICD-10-CM | POA: Diagnosis not present

## 2023-11-13 DIAGNOSIS — I10 Essential (primary) hypertension: Secondary | ICD-10-CM | POA: Diagnosis not present

## 2023-11-13 DIAGNOSIS — H548 Legal blindness, as defined in USA: Secondary | ICD-10-CM | POA: Diagnosis not present

## 2023-11-13 DIAGNOSIS — Z604 Social exclusion and rejection: Secondary | ICD-10-CM | POA: Diagnosis not present

## 2023-11-13 DIAGNOSIS — Z7982 Long term (current) use of aspirin: Secondary | ICD-10-CM | POA: Diagnosis not present

## 2023-11-13 DIAGNOSIS — J432 Centrilobular emphysema: Secondary | ICD-10-CM | POA: Diagnosis not present

## 2023-11-13 DIAGNOSIS — Z9181 History of falling: Secondary | ICD-10-CM | POA: Diagnosis not present

## 2023-11-13 DIAGNOSIS — S72322D Displaced transverse fracture of shaft of left femur, subsequent encounter for closed fracture with routine healing: Secondary | ICD-10-CM | POA: Diagnosis not present

## 2023-11-13 DIAGNOSIS — E785 Hyperlipidemia, unspecified: Secondary | ICD-10-CM | POA: Diagnosis not present

## 2023-11-13 DIAGNOSIS — I351 Nonrheumatic aortic (valve) insufficiency: Secondary | ICD-10-CM | POA: Diagnosis not present

## 2023-11-13 DIAGNOSIS — K5900 Constipation, unspecified: Secondary | ICD-10-CM | POA: Diagnosis not present

## 2023-11-13 DIAGNOSIS — G8929 Other chronic pain: Secondary | ICD-10-CM | POA: Diagnosis not present

## 2023-11-13 DIAGNOSIS — Z8673 Personal history of transient ischemic attack (TIA), and cerebral infarction without residual deficits: Secondary | ICD-10-CM | POA: Diagnosis not present

## 2023-11-13 DIAGNOSIS — Z993 Dependence on wheelchair: Secondary | ICD-10-CM | POA: Diagnosis not present

## 2023-11-19 ENCOUNTER — Other Ambulatory Visit: Payer: Self-pay | Admitting: Family Medicine

## 2023-11-19 DIAGNOSIS — I10 Essential (primary) hypertension: Secondary | ICD-10-CM

## 2023-11-20 NOTE — Telephone Encounter (Signed)
 Requested Prescriptions  Pending Prescriptions Disp Refills   amLODipine  (NORVASC ) 10 MG tablet [Pharmacy Med Name: AMLODIPINE  BESYLATE 10 MG TAB] 90 tablet 1    Sig: TAKE 1 TABLET BY MOUTH EVERY DAY     Cardiovascular: Calcium  Channel Blockers 2 Passed - 11/20/2023 12:28 PM      Passed - Last BP in normal range    BP Readings from Last 1 Encounters:  10/25/23 132/64         Passed - Last Heart Rate in normal range    Pulse Readings from Last 1 Encounters:  10/25/23 80         Passed - Valid encounter within last 6 months    Recent Outpatient Visits           3 weeks ago Acute cystitis with hematuria   [place] [facility] [place], Marsa PARAS, DO   2 months ago Acute pain of left lower extremity   [place] [facility] [place] Marsa PARAS, DO   3 months ago Benign hypertension   [place] [facility] [place], Sharyle LABOR, RPH-CPP   6 months ago Keratoacanthoma of lower leg   [place] [facility] [place] Marsa PARAS, DO   8 months ago Actinic keratosis   [place] [facility] [place], Marsa PARAS, [place]

## 2023-11-21 DIAGNOSIS — I351 Nonrheumatic aortic (valve) insufficiency: Secondary | ICD-10-CM | POA: Diagnosis not present

## 2023-11-21 DIAGNOSIS — N390 Urinary tract infection, site not specified: Secondary | ICD-10-CM | POA: Diagnosis not present

## 2023-11-21 DIAGNOSIS — T83021D Displacement of indwelling urethral catheter, subsequent encounter: Secondary | ICD-10-CM | POA: Diagnosis not present

## 2023-11-21 DIAGNOSIS — J432 Centrilobular emphysema: Secondary | ICD-10-CM | POA: Diagnosis not present

## 2023-11-21 DIAGNOSIS — S72322D Displaced transverse fracture of shaft of left femur, subsequent encounter for closed fracture with routine healing: Secondary | ICD-10-CM | POA: Diagnosis not present

## 2023-11-21 DIAGNOSIS — K5900 Constipation, unspecified: Secondary | ICD-10-CM | POA: Diagnosis not present

## 2023-11-21 DIAGNOSIS — G8929 Other chronic pain: Secondary | ICD-10-CM | POA: Diagnosis not present

## 2023-11-21 DIAGNOSIS — I10 Essential (primary) hypertension: Secondary | ICD-10-CM | POA: Diagnosis not present

## 2023-11-21 DIAGNOSIS — H548 Legal blindness, as defined in USA: Secondary | ICD-10-CM | POA: Diagnosis not present

## 2023-11-21 DIAGNOSIS — E785 Hyperlipidemia, unspecified: Secondary | ICD-10-CM | POA: Diagnosis not present

## 2023-11-23 NOTE — Home Health (Signed)
 PT to see patient for 1w1 on 11/26/2023 for late recertification as pt was not at home x2 attempts for a visit today to continue LLE strengthening and transfer training with eventual gait training once WB precautions are lifted via Dr. Laurence on 11/23/2023.

## 2023-11-26 DIAGNOSIS — H548 Legal blindness, as defined in USA: Secondary | ICD-10-CM | POA: Diagnosis not present

## 2023-11-26 DIAGNOSIS — Z9181 History of falling: Secondary | ICD-10-CM | POA: Diagnosis not present

## 2023-11-26 DIAGNOSIS — Z8673 Personal history of transient ischemic attack (TIA), and cerebral infarction without residual deficits: Secondary | ICD-10-CM | POA: Diagnosis not present

## 2023-11-26 DIAGNOSIS — I351 Nonrheumatic aortic (valve) insufficiency: Secondary | ICD-10-CM | POA: Diagnosis not present

## 2023-11-26 DIAGNOSIS — S72322D Displaced transverse fracture of shaft of left femur, subsequent encounter for closed fracture with routine healing: Secondary | ICD-10-CM | POA: Diagnosis not present

## 2023-11-26 DIAGNOSIS — E785 Hyperlipidemia, unspecified: Secondary | ICD-10-CM | POA: Diagnosis not present

## 2023-11-26 DIAGNOSIS — Z79899 Other long term (current) drug therapy: Secondary | ICD-10-CM | POA: Diagnosis not present

## 2023-11-26 DIAGNOSIS — Z7983 Long term (current) use of bisphosphonates: Secondary | ICD-10-CM | POA: Diagnosis not present

## 2023-11-26 DIAGNOSIS — Z7982 Long term (current) use of aspirin: Secondary | ICD-10-CM | POA: Diagnosis not present

## 2023-11-26 DIAGNOSIS — Z604 Social exclusion and rejection: Secondary | ICD-10-CM | POA: Diagnosis not present

## 2023-11-26 DIAGNOSIS — I10 Essential (primary) hypertension: Secondary | ICD-10-CM | POA: Diagnosis not present

## 2023-11-26 DIAGNOSIS — J432 Centrilobular emphysema: Secondary | ICD-10-CM | POA: Diagnosis not present

## 2023-11-26 DIAGNOSIS — Z993 Dependence on wheelchair: Secondary | ICD-10-CM | POA: Diagnosis not present

## 2023-11-26 DIAGNOSIS — Z79891 Long term (current) use of opiate analgesic: Secondary | ICD-10-CM | POA: Diagnosis not present

## 2023-11-26 DIAGNOSIS — K5900 Constipation, unspecified: Secondary | ICD-10-CM | POA: Diagnosis not present

## 2023-11-26 DIAGNOSIS — Z556 Problems related to health literacy: Secondary | ICD-10-CM | POA: Diagnosis not present

## 2023-11-26 DIAGNOSIS — K219 Gastro-esophageal reflux disease without esophagitis: Secondary | ICD-10-CM | POA: Diagnosis not present

## 2023-11-26 DIAGNOSIS — G8929 Other chronic pain: Secondary | ICD-10-CM | POA: Diagnosis not present

## 2023-11-27 NOTE — Telephone Encounter (Signed)
Lm for kimberly(home health) to return call

## 2023-11-27 NOTE — Telephone Encounter (Signed)
Spoke with husband they are at [facility] for him.    Spoke with Kimberly(homeHealth) she will put an order in for nursing to go visit and do a urine test. Explained to her this needed to be done within the next day or so. She is to contact our office and let us know when this will happen.

## 2023-11-27 NOTE — Home Health (Signed)
 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 Patient returns to orthopedic physician on February 10th for her 2nd follow up and potential increase in weight bearing to begin ambulating. 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 She has improved score on Falls Efficacy scale from 24 at last reassessment to now a 21 indicating reduced fear of falling. Pt reports at PLOF she walked with a hemiwalker on her L side and it is her ultimate goal to return to this. Today patient is lethargic, more confused hallucinating that items are on the ceiling, and reports it burns when she urinates. Therapist contacted physician regarding these findings and PCP requested RN evaluation/treat order to investigate possible UTI. Pt reports to therapist that she has been performing HEP mulitple times a day and it assists her with pain management on the LLE. Therapist requesting to continue PT svcs at a frequency of 1w6 with potential to increase frequency if WB status is progressed. Pt in agreement with plan.

## 2023-11-27 NOTE — Telephone Encounter (Signed)
Okay to proceed with verbal orders  Regarding symptoms with confusion and dysuria she should be seen for UTI evaluation. Next available apt or can go to Urgent Care for testing.  Home health may be able to test her, but it can take several days to get results, it is usually not the recommended method.  [name], DO [facility] 11/27/2023, 10:25 AM

## 2023-11-27 NOTE — Telephone Encounter (Signed)
Copied from CRM [id number]. Topic: General - Other >> Nov 27, 2023  8:55 AM Phill Myron wrote: Home Health Verbal Orders - Caller/Agency: Cala Bradford with [facility] Callback Number: [phone] Service Requested:  Frequency:  Any new concerns about the patient? Yes  Patient is showing confusion, seeing things on the ceiling.  Patient also states that she is experiencing burning when she urinates.

## 2023-11-28 ENCOUNTER — Telehealth: Payer: Self-pay | Admitting: Family Medicine

## 2023-11-28 DIAGNOSIS — I351 Nonrheumatic aortic (valve) insufficiency: Secondary | ICD-10-CM | POA: Diagnosis not present

## 2023-11-28 DIAGNOSIS — Z993 Dependence on wheelchair: Secondary | ICD-10-CM | POA: Diagnosis not present

## 2023-11-28 DIAGNOSIS — H548 Legal blindness, as defined in USA: Secondary | ICD-10-CM | POA: Diagnosis not present

## 2023-11-28 DIAGNOSIS — Z79891 Long term (current) use of opiate analgesic: Secondary | ICD-10-CM | POA: Diagnosis not present

## 2023-11-28 DIAGNOSIS — Z556 Problems related to health literacy: Secondary | ICD-10-CM | POA: Diagnosis not present

## 2023-11-28 DIAGNOSIS — K5900 Constipation, unspecified: Secondary | ICD-10-CM | POA: Diagnosis not present

## 2023-11-28 DIAGNOSIS — K219 Gastro-esophageal reflux disease without esophagitis: Secondary | ICD-10-CM | POA: Diagnosis not present

## 2023-11-28 DIAGNOSIS — Z7982 Long term (current) use of aspirin: Secondary | ICD-10-CM | POA: Diagnosis not present

## 2023-11-28 DIAGNOSIS — Z9181 History of falling: Secondary | ICD-10-CM | POA: Diagnosis not present

## 2023-11-28 DIAGNOSIS — Z79899 Other long term (current) drug therapy: Secondary | ICD-10-CM | POA: Diagnosis not present

## 2023-11-28 DIAGNOSIS — I10 Essential (primary) hypertension: Secondary | ICD-10-CM | POA: Diagnosis not present

## 2023-11-28 DIAGNOSIS — Z604 Social exclusion and rejection: Secondary | ICD-10-CM | POA: Diagnosis not present

## 2023-11-28 DIAGNOSIS — S72322D Displaced transverse fracture of shaft of left femur, subsequent encounter for closed fracture with routine healing: Secondary | ICD-10-CM | POA: Diagnosis not present

## 2023-11-28 DIAGNOSIS — G8929 Other chronic pain: Secondary | ICD-10-CM | POA: Diagnosis not present

## 2023-11-28 DIAGNOSIS — Z7983 Long term (current) use of bisphosphonates: Secondary | ICD-10-CM | POA: Diagnosis not present

## 2023-11-28 DIAGNOSIS — E785 Hyperlipidemia, unspecified: Secondary | ICD-10-CM | POA: Diagnosis not present

## 2023-11-28 DIAGNOSIS — J432 Centrilobular emphysema: Secondary | ICD-10-CM | POA: Diagnosis not present

## 2023-11-28 DIAGNOSIS — Z8673 Personal history of transient ischemic attack (TIA), and cerebral infarction without residual deficits: Secondary | ICD-10-CM | POA: Diagnosis not present

## 2023-11-28 NOTE — Telephone Encounter (Signed)
[facility] was at patients house and needed an order for a urine sample for a possible uti. I tried to call the office but they was already gone. Home health requested to send order in so they can go back out and set sample.   [facility] fax [phone]

## 2023-11-29 NOTE — Telephone Encounter (Signed)
The order has been faxed

## 2023-11-29 NOTE — Telephone Encounter (Signed)
Yes we can proceed. We did receive fax yesterday, and I have signed the fax order, ready to send back.  [name], DO [facility] 11/29/2023, 8:08 AM

## 2023-11-29 NOTE — Home Health (Signed)
 SN left message with Donny at Dr. Lia office 11/28/23 regarding obtaining an order to collect a UA, and see client one more time as client is reporting burning with urination, urgency, frequency and increased nocturia.   Home Health Skilled Nursing Evaluation completed.  Primary Reason for Nursing service referral: s/s of UTI, medication reconcilliation completed, clients husband manages medications and administering them correctly. Vitals normal, denies pain.   Follow up for next visit: UA collection, education r/t UTI, s/s of infection.

## 2023-12-03 ENCOUNTER — Ambulatory Visit (INDEPENDENT_AMBULATORY_CARE_PROVIDER_SITE_OTHER): Payer: Medicare Other | Admitting: Family Medicine

## 2023-12-03 ENCOUNTER — Encounter: Payer: Self-pay | Admitting: Family Medicine

## 2023-12-03 VITALS — BP 130/64 | HR 96 | Ht <= 58 in | Wt 90.0 lb

## 2023-12-03 DIAGNOSIS — I10 Essential (primary) hypertension: Secondary | ICD-10-CM | POA: Diagnosis not present

## 2023-12-03 DIAGNOSIS — Z79899 Other long term (current) drug therapy: Secondary | ICD-10-CM | POA: Diagnosis not present

## 2023-12-03 DIAGNOSIS — M25562 Pain in left knee: Secondary | ICD-10-CM

## 2023-12-03 DIAGNOSIS — B379 Candidiasis, unspecified: Secondary | ICD-10-CM

## 2023-12-03 DIAGNOSIS — Z993 Dependence on wheelchair: Secondary | ICD-10-CM | POA: Diagnosis not present

## 2023-12-03 DIAGNOSIS — H548 Legal blindness, as defined in USA: Secondary | ICD-10-CM | POA: Diagnosis not present

## 2023-12-03 DIAGNOSIS — Z8673 Personal history of transient ischemic attack (TIA), and cerebral infarction without residual deficits: Secondary | ICD-10-CM | POA: Diagnosis not present

## 2023-12-03 DIAGNOSIS — M79605 Pain in left leg: Secondary | ICD-10-CM | POA: Diagnosis not present

## 2023-12-03 DIAGNOSIS — N3001 Acute cystitis with hematuria: Secondary | ICD-10-CM | POA: Diagnosis not present

## 2023-12-03 DIAGNOSIS — M170 Bilateral primary osteoarthritis of knee: Secondary | ICD-10-CM

## 2023-12-03 DIAGNOSIS — K5900 Constipation, unspecified: Secondary | ICD-10-CM | POA: Diagnosis not present

## 2023-12-03 DIAGNOSIS — Z79891 Long term (current) use of opiate analgesic: Secondary | ICD-10-CM | POA: Diagnosis not present

## 2023-12-03 DIAGNOSIS — S72042D Displaced fracture of base of neck of left femur, subsequent encounter for closed fracture with routine healing: Secondary | ICD-10-CM

## 2023-12-03 DIAGNOSIS — Z9181 History of falling: Secondary | ICD-10-CM | POA: Diagnosis not present

## 2023-12-03 DIAGNOSIS — S72322D Displaced transverse fracture of shaft of left femur, subsequent encounter for closed fracture with routine healing: Secondary | ICD-10-CM | POA: Diagnosis not present

## 2023-12-03 DIAGNOSIS — I351 Nonrheumatic aortic (valve) insufficiency: Secondary | ICD-10-CM | POA: Diagnosis not present

## 2023-12-03 DIAGNOSIS — G8929 Other chronic pain: Secondary | ICD-10-CM | POA: Diagnosis not present

## 2023-12-03 DIAGNOSIS — Z604 Social exclusion and rejection: Secondary | ICD-10-CM | POA: Diagnosis not present

## 2023-12-03 DIAGNOSIS — Z7983 Long term (current) use of bisphosphonates: Secondary | ICD-10-CM | POA: Diagnosis not present

## 2023-12-03 DIAGNOSIS — F3176 Bipolar disorder, in full remission, most recent episode depressed: Secondary | ICD-10-CM

## 2023-12-03 DIAGNOSIS — R300 Dysuria: Secondary | ICD-10-CM | POA: Diagnosis not present

## 2023-12-03 DIAGNOSIS — E782 Mixed hyperlipidemia: Secondary | ICD-10-CM

## 2023-12-03 DIAGNOSIS — K219 Gastro-esophageal reflux disease without esophagitis: Secondary | ICD-10-CM | POA: Diagnosis not present

## 2023-12-03 DIAGNOSIS — M25561 Pain in right knee: Secondary | ICD-10-CM

## 2023-12-03 DIAGNOSIS — J432 Centrilobular emphysema: Secondary | ICD-10-CM | POA: Diagnosis not present

## 2023-12-03 DIAGNOSIS — Z7982 Long term (current) use of aspirin: Secondary | ICD-10-CM | POA: Diagnosis not present

## 2023-12-03 DIAGNOSIS — Z556 Problems related to health literacy: Secondary | ICD-10-CM | POA: Diagnosis not present

## 2023-12-03 DIAGNOSIS — E785 Hyperlipidemia, unspecified: Secondary | ICD-10-CM | POA: Diagnosis not present

## 2023-12-03 LAB — POCT URINALYSIS DIPSTICK
Bilirubin, UA: NEGATIVE
Blood, UA: NEGATIVE
Glucose, UA: NEGATIVE
Ketones, UA: NEGATIVE
Leukocytes, UA: NEGATIVE
Nitrite, UA: NEGATIVE
Odor: POSITIVE
Protein, UA: NEGATIVE
Spec Grav, UA: 1.02 (ref 1.010–1.025)
Urobilinogen, UA: 0.2 U/dL
pH, UA: 5 (ref 5.0–8.0)

## 2023-12-03 MED ORDER — SIMVASTATIN 20 MG PO TABS: 20.0000 mg | ORAL_TABLET | Freq: Every day | ORAL | 3 refills | Status: DC

## 2023-12-03 MED ORDER — BUPROPION HCL ER (XL) 150 MG PO TB24: 150.0000 mg | ORAL_TABLET | Freq: Every day | ORAL | 1 refills | Status: DC

## 2023-12-03 MED ORDER — VENLAFAXINE HCL ER 75 MG PO CP24: 75.0000 mg | ORAL_CAPSULE | Freq: Every day | ORAL | 1 refills | Status: DC

## 2023-12-03 MED ORDER — HYDROCODONE-ACETAMINOPHEN 5-325 MG PO TABS: 1.0000 | ORAL_TABLET | ORAL | 0 refills | Status: DC | PRN

## 2023-12-03 MED ORDER — FLUCONAZOLE 150 MG PO TABS: ORAL_TABLET | ORAL | 1 refills | Status: DC

## 2023-12-03 MED ORDER — CEPHALEXIN 500 MG PO CAPS: 500.0000 mg | ORAL_CAPSULE | Freq: Three times a day (TID) | ORAL | 0 refills | Status: DC

## 2023-12-03 NOTE — Progress Notes (Signed)
Subjective:    Patient ID: Pamela Ball, female    DOB: [birth year], 78 y.o.   MRN: [id number]  Pamela Ball is a 78 y.o. female presenting on 12/03/2023 for Dysuria   HPI  Discussed the use of AI scribe software for clinical note transcription with the patient, who gave verbal consent to proceed.  History of Present Illness    Dysuria Urinary Retention  The patient presents with urinary symptoms, including a burning sensation during urination and difficulty in voiding. She reports an increased urge to urinate, but describes difficulty in producing urine, stating "it just won't come out." The patient also notes that her urine has been dark and has an increased odor.  The patient has a history of urinary retention, diagnosed by a urologist at [facility] in December of the previous year. She reports that her urinary issues began after the removal of a catheter. The patient was given medication by the urologist to assist with bladder function, but the specific medication is not mentioned in the conversation.  The patient also mentions taking pain medication for a separate issue back and hip, which she notes has been causing constipation. She expresses a need for a refill of this medication due to ongoing pain.  The patient is scheduled for a follow-up appointment with the urologist, but the exact date is unclear. She is advised to contact the urologist's office to confirm the appointment.  The patient is also on other medications, including olanzapine, venlafaxine, and bupropion, which are managed by another provider. The patient requests refills for these medications as well.          03/15/2023    9:33 AM 02/01/2023    8:51 AM 10/19/2022   10:39 AM  Depression screen PHQ 2/9  Decreased Interest 0 0 0  Down, Depressed, Hopeless 0 0 0  PHQ - 2 Score 0 0 0  Altered sleeping  0 3  Tired, decreased energy  0 0  Change in appetite  0 3  Feeling bad or failure about yourself   0 0   Trouble concentrating  0 0  Moving slowly or fidgety/restless  0 0  Suicidal thoughts  0 0  PHQ-9 Score  0 6  Difficult doing work/chores  Not difficult at all Not difficult at all       03/15/2023    9:33 AM 02/01/2023    8:51 AM 10/19/2022   10:39 AM 05/25/2022    9:05 AM  GAD 7 : Generalized Anxiety Score  Nervous, Anxious, on Edge 0 0 0 0  Control/stop worrying 0 0 0 0  Worry too much - different things 0 0 0 0  Trouble relaxing 0 0 0 0  Restless 0 0 0 0  Easily annoyed or irritable 0 0 0 0  Afraid - awful might happen 0 0 0 0  Total GAD 7 Score 0 0 0 0  Anxiety Difficulty  Not difficult at all Not difficult at all Not difficult at all    Social History   Tobacco Use   Smoking status: Never   Smokeless tobacco: Never  Vaping Use   Vaping status: Never Used  Substance Use Topics   Alcohol use: No    Comment: drink occasionally    Drug use: No    Review of Systems Per HPI unless specifically indicated above     Objective:    BP 130/64   Pulse 96   Ht 4\' 10"  (1.473 m)  Wt 90 lb (40.8 kg)   BMI 18.81 kg/m   Wt Readings from Last 3 Encounters:  12/03/23 90 lb (40.8 kg)  10/05/23 91 lb (41.3 kg)  09/27/23 91 lb (41.3 kg)    Physical Exam Vitals and nursing note reviewed.  Constitutional:      General: She is not in acute distress.    Appearance: Normal appearance. She is well-developed. She is not diaphoretic.     Comments: Well-appearing, comfortable, cooperative  HENT:     Head: Normocephalic and atraumatic.  Eyes:     General:        Right eye: No discharge.        Left eye: No discharge.     Conjunctiva/sclera: Conjunctivae normal.  Cardiovascular:     Rate and Rhythm: Normal rate.  Pulmonary:     Effort: Pulmonary effort is normal.  Skin:    General: Skin is warm and dry.     Findings: No erythema or rash.  Neurological:     Mental Status: She is alert and oriented to person, place, and time.  Psychiatric:        Mood and Affect: Mood  normal.        Behavior: Behavior normal.        Thought Content: Thought content normal.     Comments: Well groomed, good eye contact, normal speech and thoughts     Results for orders placed or performed in visit on 12/03/23  POCT Urinalysis Dipstick   Collection Time: 12/03/23  2:03 PM  Result Value Ref Range   Color, UA yellow    Clarity, UA clear    Glucose, UA Negative Negative   Bilirubin, UA negative    Ketones, UA negative    Spec Grav, UA 1.020 1.010 - 1.025   Blood, UA negative    pH, UA 5.0 5.0 - 8.0   Protein, UA Negative Negative   Urobilinogen, UA 0.2 0.2 or 1.0 E.U./dL   Nitrite, UA negative    Leukocytes, UA Negative Negative   Appearance     Odor positive       Assessment & Plan:   Problem List Items Addressed This Visit     Bipolar 1 disorder, depressed, full remission (HCC)   Relevant Medications   venlafaxine XR (EFFEXOR-XR) 75 MG 24 hr capsule   buPROPion (WELLBUTRIN XL) 150 MG 24 hr tablet   Hyperlipidemia   Relevant Medications   simvastatin (ZOCOR) 20 MG tablet   Osteoarthritis of knees, bilateral   Relevant Medications   HYDROcodone-acetaminophen (NORCO/VICODIN) 5-325 MG tablet   Other Visit Diagnoses       Acute cystitis with hematuria    -  Primary   Relevant Medications   cephALEXin (KEFLEX) 500 MG capsule   Other Relevant Orders   Urine Culture     Dysuria       Relevant Orders   POCT Urinalysis Dipstick (Completed)     Left leg pain       Relevant Medications   HYDROcodone-acetaminophen (NORCO/VICODIN) 5-325 MG tablet     Bilateral chronic knee pain       Relevant Medications   HYDROcodone-acetaminophen (NORCO/VICODIN) 5-325 MG tablet   venlafaxine XR (EFFEXOR-XR) 75 MG 24 hr capsule   buPROPion (WELLBUTRIN XL) 150 MG 24 hr tablet     Closed displaced basicervical fracture of left femur with routine healing, subsequent encounter       Relevant Medications   HYDROcodone-acetaminophen (NORCO/VICODIN) 5-325 MG tablet  Antibiotic-induced yeast infection       Relevant Medications   fluconazole (DIFLUCAN) 150 MG tablet   cephALEXin (KEFLEX) 500 MG capsule        Dysuria Urinary Retention Difficulty urinating with increased urinary urge and dark, concentrated urine. No signs of infection on initial urine test. 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  Discussed possibility of opioid and other med causing retention. Limited options here  - Send urine to lab for culture to rule out infection. - Start Keflex as a precautionary measure for possible infection. Fluconazole for antibiotic yeast if needed - Advise patient to contact [facility] for follow-up appointment given concern with possible Urinary Retention and no follow-up visit has been scheduled.  Strict return criteria given for emergency situation if Urinary retention >6 hours and cannot avoid and has symptoms or pain or acute concern. She has required catheter before and may require again.   Medication Refills Multiple medications due for refill including Effexor, Wellbutrin, and pain medication. Olanzapine refill needed from Dr. Berline Chough office. - Refill Effexor, Wellbutrin, and pain medication for 90 days and send to [facility]. - Advise patient to contact Dr. Berline Chough office for Olanzapine prescription refill.         Orders Placed This Encounter  Procedures   Urine Culture   POCT Urinalysis Dipstick    Meds ordered this encounter  Medications   HYDROcodone-acetaminophen (NORCO/VICODIN) 5-325 MG tablet    Sig: Take 1 tablet by mouth every 4 (four) hours as needed for moderate pain (pain score 4-6).    Dispense:  30 tablet    Refill:  0    First fill 12/03/23   fluconazole (DIFLUCAN) 150 MG tablet    Sig: Take one tablet by mouth on Day 1. Repeat dose 2nd tablet on Day 3.    Dispense:  2 tablet    Refill:  1   cephALEXin (KEFLEX) 500 MG capsule    Sig: Take 1 capsule (500 mg total) by mouth 3 (three) times daily.  For 7 days    Dispense:  21 capsule    Refill:  0   venlafaxine XR (EFFEXOR-XR) 75 MG 24 hr capsule    Sig: Take 1 capsule (75 mg total) by mouth daily with breakfast.    Dispense:  90 capsule    Refill:  1   simvastatin (ZOCOR) 20 MG tablet    Sig: Take 1 tablet (20 mg total) by mouth daily.    Dispense:  90 tablet    Refill:  3   buPROPion (WELLBUTRIN XL) 150 MG 24 hr tablet    Sig: Take 1 tablet (150 mg total) by mouth daily.    Dispense:  90 tablet    Refill:  1    Follow up plan: Return if symptoms worsen or fail to improve.   [name], DO [facility] 12/03/2023, 2:05 PM

## 2023-12-03 NOTE — Patient Instructions (Addendum)
Thank you for coming to the office today.  Urine dipstick today negative.  Concerned about urinary retention.  Call [facility] to check when next apt is, it should have been in mid January.  Pamela Lan, FNP  [address]  [place], [place] [address]  Phone: [phone]  Concern for possible infection but it is not guaranteed, okay to take the antibiotic now Keflex and yeast pill if needed  But very important to check in with Urologist next  Call Dr Berline Chough office for Olanzapine rx  Other meds order today, Wellbutrin (Bupropion), Effexor, Simvastatin, Amlodipine  Please schedule a Follow-up Appointment to: Return if symptoms worsen or fail to improve.  If you have any other questions or concerns, please feel free to call the office or send a message through MyChart. You may also schedule an earlier appointment if necessary.  Additionally, you may be receiving a survey about your experience at our office within a few days to 1 week by e-mail or mail. We value your feedback.  [name], DO [facility], [place]

## 2023-12-04 LAB — URINE CULTURE
MICRO NUMBER:: 16003311
Result:: NO GROWTH
SPECIMEN QUALITY:: ADEQUATE

## 2023-12-05 ENCOUNTER — Telehealth: Payer: Self-pay | Admitting: Family Medicine

## 2023-12-05 NOTE — Telephone Encounter (Signed)
Called LVM 12/05/2023 to schedule AWV. Please schedule office or virtual visits.  Pamela Ball; Care Guide Ambulatory Clinical Support [facility] Direct Dial: [phone]

## 2023-12-10 DIAGNOSIS — K5900 Constipation, unspecified: Secondary | ICD-10-CM | POA: Diagnosis not present

## 2023-12-10 DIAGNOSIS — Z7982 Long term (current) use of aspirin: Secondary | ICD-10-CM | POA: Diagnosis not present

## 2023-12-10 DIAGNOSIS — Z556 Problems related to health literacy: Secondary | ICD-10-CM | POA: Diagnosis not present

## 2023-12-10 DIAGNOSIS — Z79899 Other long term (current) drug therapy: Secondary | ICD-10-CM | POA: Diagnosis not present

## 2023-12-10 DIAGNOSIS — S72322D Displaced transverse fracture of shaft of left femur, subsequent encounter for closed fracture with routine healing: Secondary | ICD-10-CM | POA: Diagnosis not present

## 2023-12-10 DIAGNOSIS — G8929 Other chronic pain: Secondary | ICD-10-CM | POA: Diagnosis not present

## 2023-12-10 DIAGNOSIS — Z9181 History of falling: Secondary | ICD-10-CM | POA: Diagnosis not present

## 2023-12-10 DIAGNOSIS — H548 Legal blindness, as defined in USA: Secondary | ICD-10-CM | POA: Diagnosis not present

## 2023-12-10 DIAGNOSIS — Z8673 Personal history of transient ischemic attack (TIA), and cerebral infarction without residual deficits: Secondary | ICD-10-CM | POA: Diagnosis not present

## 2023-12-10 DIAGNOSIS — Z79891 Long term (current) use of opiate analgesic: Secondary | ICD-10-CM | POA: Diagnosis not present

## 2023-12-10 DIAGNOSIS — K219 Gastro-esophageal reflux disease without esophagitis: Secondary | ICD-10-CM | POA: Diagnosis not present

## 2023-12-10 DIAGNOSIS — Z604 Social exclusion and rejection: Secondary | ICD-10-CM | POA: Diagnosis not present

## 2023-12-10 DIAGNOSIS — Z993 Dependence on wheelchair: Secondary | ICD-10-CM | POA: Diagnosis not present

## 2023-12-10 DIAGNOSIS — E785 Hyperlipidemia, unspecified: Secondary | ICD-10-CM | POA: Diagnosis not present

## 2023-12-10 DIAGNOSIS — J432 Centrilobular emphysema: Secondary | ICD-10-CM | POA: Diagnosis not present

## 2023-12-10 DIAGNOSIS — I351 Nonrheumatic aortic (valve) insufficiency: Secondary | ICD-10-CM | POA: Diagnosis not present

## 2023-12-10 DIAGNOSIS — Z7983 Long term (current) use of bisphosphonates: Secondary | ICD-10-CM | POA: Diagnosis not present

## 2023-12-10 DIAGNOSIS — I10 Essential (primary) hypertension: Secondary | ICD-10-CM | POA: Diagnosis not present

## 2023-12-13 DIAGNOSIS — J432 Centrilobular emphysema: Secondary | ICD-10-CM | POA: Diagnosis not present

## 2023-12-13 DIAGNOSIS — H548 Legal blindness, as defined in USA: Secondary | ICD-10-CM | POA: Diagnosis not present

## 2023-12-13 DIAGNOSIS — I351 Nonrheumatic aortic (valve) insufficiency: Secondary | ICD-10-CM | POA: Diagnosis not present

## 2023-12-13 DIAGNOSIS — K5900 Constipation, unspecified: Secondary | ICD-10-CM | POA: Diagnosis not present

## 2023-12-13 DIAGNOSIS — S72322D Displaced transverse fracture of shaft of left femur, subsequent encounter for closed fracture with routine healing: Secondary | ICD-10-CM | POA: Diagnosis not present

## 2023-12-13 DIAGNOSIS — Z8673 Personal history of transient ischemic attack (TIA), and cerebral infarction without residual deficits: Secondary | ICD-10-CM | POA: Diagnosis not present

## 2023-12-13 DIAGNOSIS — K219 Gastro-esophageal reflux disease without esophagitis: Secondary | ICD-10-CM | POA: Diagnosis not present

## 2023-12-13 DIAGNOSIS — E785 Hyperlipidemia, unspecified: Secondary | ICD-10-CM | POA: Diagnosis not present

## 2023-12-13 DIAGNOSIS — G8929 Other chronic pain: Secondary | ICD-10-CM | POA: Diagnosis not present

## 2023-12-13 DIAGNOSIS — I10 Essential (primary) hypertension: Secondary | ICD-10-CM | POA: Diagnosis not present

## 2023-12-17 DIAGNOSIS — S72402D Unspecified fracture of lower end of left femur, subsequent encounter for closed fracture with routine healing: Secondary | ICD-10-CM | POA: Diagnosis not present

## 2023-12-17 DIAGNOSIS — S72322D Displaced transverse fracture of shaft of left femur, subsequent encounter for closed fracture with routine healing: Secondary | ICD-10-CM | POA: Diagnosis not present

## 2023-12-17 NOTE — Progress Notes (Signed)
-------------------------------------------------------------------------------   Attestation signed by Laurence Prentice Levan, MD MPH at 12/17/23 1025 Agree with resident assessment and plan as above except for following changes. Patient independently seen and evaluated.   X-rays demonstrate a healed fracture.  Can weight-bear as tolerated on the left lower extremity with a walker.  Return to clinic in 2 to 3 months time with repeat radiographs of the left femur.  Prentice Laurence, MD Orthopaedic Trauma   -------------------------------------------------------------------------------  ORTHOPAEDIC TRAUMA SERVICE POST-OPERATIVE CLINIC NOTE  Diagnosis: Left peri-implant distal femur fracture Surgery: ORIF left distal femur  Date of Surgery: 09/18/23  Subjective: Patient presents for 12 week follow-up of the above aforementioned procedure.  Overall her pain has been well-controlled and she has been working on range of motion exercises while being compliant with her toe-touch weightbearing status to the left lower extremity, physical therapy has been visiting her house 1 time per week and she has stated that when she is allowed to weight-bear this will increase in frequency per her physical therapist.  She has had no issues with wound dehiscence or drainage, she has sustained no recent falls.  She is overall happy with her care.   Objective:  Physical Exam: General: Well-nourished in no acute distress.  CV: Palpable pedal pulses. Head: Normocephalic and atraumatic Respiratory: Unlabored breathing and symmetric chest rise.  Psychiatric: Alert and oriented. Appropriate mood and affect. Musculoskeletal:  Left lower extremity: Incision well healed. Active and passive ROM 0-90. No varus/valgus instability.   Pertinent Imaging: I ordered and independently reviewed and interpreted x-rays of L femur which demonstrates interval callous formation when compared to prior radiographs. No hardware malposition or  failure.   Assessment: This is a 78 y.o. female approximately 12 weeks status post ORIF of a left distal femur peri-implant fracture.  Overall she is doing well postoperatively and her plain radiographs demonstrate bony healing.  Plan:  Overall we are happy with her healing at this point and she is now 12 weeks out from surgery, we would like her to advance to weightbearing as tolerated to left lower extremity and continue with range of motion as tolerated.  Additionally we would like her to continue working with physical therapy on strength and range of motion to the left lower extremity.  We will see her back in clinic in 2 months for repeat evaluation and obtain new x-rays.  No follow-ups on file. X-rays to be ordered at next visit: Left femur   Garnette Notch, PGY2 [facility] Orthopedic Surgery

## 2023-12-18 DIAGNOSIS — Z8673 Personal history of transient ischemic attack (TIA), and cerebral infarction without residual deficits: Secondary | ICD-10-CM | POA: Diagnosis not present

## 2023-12-18 DIAGNOSIS — Z9181 History of falling: Secondary | ICD-10-CM | POA: Diagnosis not present

## 2023-12-18 DIAGNOSIS — J432 Centrilobular emphysema: Secondary | ICD-10-CM | POA: Diagnosis not present

## 2023-12-18 DIAGNOSIS — Z993 Dependence on wheelchair: Secondary | ICD-10-CM | POA: Diagnosis not present

## 2023-12-18 DIAGNOSIS — H548 Legal blindness, as defined in USA: Secondary | ICD-10-CM | POA: Diagnosis not present

## 2023-12-18 DIAGNOSIS — S72322D Displaced transverse fracture of shaft of left femur, subsequent encounter for closed fracture with routine healing: Secondary | ICD-10-CM | POA: Diagnosis not present

## 2023-12-18 DIAGNOSIS — Z556 Problems related to health literacy: Secondary | ICD-10-CM | POA: Diagnosis not present

## 2023-12-18 DIAGNOSIS — Z79891 Long term (current) use of opiate analgesic: Secondary | ICD-10-CM | POA: Diagnosis not present

## 2023-12-18 DIAGNOSIS — Z7982 Long term (current) use of aspirin: Secondary | ICD-10-CM | POA: Diagnosis not present

## 2023-12-18 DIAGNOSIS — I10 Essential (primary) hypertension: Secondary | ICD-10-CM | POA: Diagnosis not present

## 2023-12-18 DIAGNOSIS — Z604 Social exclusion and rejection: Secondary | ICD-10-CM | POA: Diagnosis not present

## 2023-12-18 DIAGNOSIS — Z7983 Long term (current) use of bisphosphonates: Secondary | ICD-10-CM | POA: Diagnosis not present

## 2023-12-18 DIAGNOSIS — K5900 Constipation, unspecified: Secondary | ICD-10-CM | POA: Diagnosis not present

## 2023-12-18 DIAGNOSIS — K219 Gastro-esophageal reflux disease without esophagitis: Secondary | ICD-10-CM | POA: Diagnosis not present

## 2023-12-18 DIAGNOSIS — E785 Hyperlipidemia, unspecified: Secondary | ICD-10-CM | POA: Diagnosis not present

## 2023-12-18 DIAGNOSIS — G8929 Other chronic pain: Secondary | ICD-10-CM | POA: Diagnosis not present

## 2023-12-18 DIAGNOSIS — Z79899 Other long term (current) drug therapy: Secondary | ICD-10-CM | POA: Diagnosis not present

## 2023-12-18 DIAGNOSIS — I351 Nonrheumatic aortic (valve) insufficiency: Secondary | ICD-10-CM | POA: Diagnosis not present

## 2023-12-20 DIAGNOSIS — I351 Nonrheumatic aortic (valve) insufficiency: Secondary | ICD-10-CM | POA: Diagnosis not present

## 2023-12-20 DIAGNOSIS — Z556 Problems related to health literacy: Secondary | ICD-10-CM | POA: Diagnosis not present

## 2023-12-20 DIAGNOSIS — Z7982 Long term (current) use of aspirin: Secondary | ICD-10-CM | POA: Diagnosis not present

## 2023-12-20 DIAGNOSIS — E785 Hyperlipidemia, unspecified: Secondary | ICD-10-CM | POA: Diagnosis not present

## 2023-12-20 DIAGNOSIS — Z79891 Long term (current) use of opiate analgesic: Secondary | ICD-10-CM | POA: Diagnosis not present

## 2023-12-20 DIAGNOSIS — Z604 Social exclusion and rejection: Secondary | ICD-10-CM | POA: Diagnosis not present

## 2023-12-20 DIAGNOSIS — S72322D Displaced transverse fracture of shaft of left femur, subsequent encounter for closed fracture with routine healing: Secondary | ICD-10-CM | POA: Diagnosis not present

## 2023-12-20 DIAGNOSIS — J432 Centrilobular emphysema: Secondary | ICD-10-CM | POA: Diagnosis not present

## 2023-12-20 DIAGNOSIS — Z79899 Other long term (current) drug therapy: Secondary | ICD-10-CM | POA: Diagnosis not present

## 2023-12-20 DIAGNOSIS — K219 Gastro-esophageal reflux disease without esophagitis: Secondary | ICD-10-CM | POA: Diagnosis not present

## 2023-12-20 DIAGNOSIS — Z8673 Personal history of transient ischemic attack (TIA), and cerebral infarction without residual deficits: Secondary | ICD-10-CM | POA: Diagnosis not present

## 2023-12-20 DIAGNOSIS — I10 Essential (primary) hypertension: Secondary | ICD-10-CM | POA: Diagnosis not present

## 2023-12-20 DIAGNOSIS — Z7983 Long term (current) use of bisphosphonates: Secondary | ICD-10-CM | POA: Diagnosis not present

## 2023-12-20 DIAGNOSIS — K5900 Constipation, unspecified: Secondary | ICD-10-CM | POA: Diagnosis not present

## 2023-12-20 DIAGNOSIS — Z993 Dependence on wheelchair: Secondary | ICD-10-CM | POA: Diagnosis not present

## 2023-12-20 DIAGNOSIS — Z9181 History of falling: Secondary | ICD-10-CM | POA: Diagnosis not present

## 2023-12-20 DIAGNOSIS — G8929 Other chronic pain: Secondary | ICD-10-CM | POA: Diagnosis not present

## 2023-12-20 DIAGNOSIS — H548 Legal blindness, as defined in USA: Secondary | ICD-10-CM | POA: Diagnosis not present

## 2023-12-20 NOTE — Home Health (Signed)
 30 day Reassessment: Patient has shown improvement throughout course of care especially with LLE and recent went to orthopedic MD where he upgraded her weight bearing to WBAT on the LLE. Pt was able to tolerate all LLE exs today and reports she is compliant with them throughout the day reducing pain and discomfort to her LLE. Pt advanced today to transferring up to standing with min A and initiating walking on level surfaces with RW. Pt needs assist to bring her RUE up to the walker due to hx of CVA however once placed she was able to complete 2 reps of 37ft w/mod A and wheelchair follow for safety. She shows significant improvement to Barthel Index from 10 at recertification to now 12 indicating a reduced risk of disability. Pt also was able to advance FES from 21 to 17 showing a reduced fear of falling and improved confidence with all mobility. Pt reports she is very motivated to improve and is excited to walk more today.

## 2023-12-24 DIAGNOSIS — K5900 Constipation, unspecified: Secondary | ICD-10-CM | POA: Diagnosis not present

## 2023-12-24 DIAGNOSIS — Z8673 Personal history of transient ischemic attack (TIA), and cerebral infarction without residual deficits: Secondary | ICD-10-CM | POA: Diagnosis not present

## 2023-12-24 DIAGNOSIS — I351 Nonrheumatic aortic (valve) insufficiency: Secondary | ICD-10-CM | POA: Diagnosis not present

## 2023-12-24 DIAGNOSIS — K219 Gastro-esophageal reflux disease without esophagitis: Secondary | ICD-10-CM | POA: Diagnosis not present

## 2023-12-24 DIAGNOSIS — G8929 Other chronic pain: Secondary | ICD-10-CM | POA: Diagnosis not present

## 2023-12-24 DIAGNOSIS — Z79891 Long term (current) use of opiate analgesic: Secondary | ICD-10-CM | POA: Diagnosis not present

## 2023-12-24 DIAGNOSIS — Z79899 Other long term (current) drug therapy: Secondary | ICD-10-CM | POA: Diagnosis not present

## 2023-12-24 DIAGNOSIS — E785 Hyperlipidemia, unspecified: Secondary | ICD-10-CM | POA: Diagnosis not present

## 2023-12-24 DIAGNOSIS — H548 Legal blindness, as defined in USA: Secondary | ICD-10-CM | POA: Diagnosis not present

## 2023-12-24 DIAGNOSIS — Z9181 History of falling: Secondary | ICD-10-CM | POA: Diagnosis not present

## 2023-12-24 DIAGNOSIS — J432 Centrilobular emphysema: Secondary | ICD-10-CM | POA: Diagnosis not present

## 2023-12-24 DIAGNOSIS — Z556 Problems related to health literacy: Secondary | ICD-10-CM | POA: Diagnosis not present

## 2023-12-24 DIAGNOSIS — Z993 Dependence on wheelchair: Secondary | ICD-10-CM | POA: Diagnosis not present

## 2023-12-24 DIAGNOSIS — S72322D Displaced transverse fracture of shaft of left femur, subsequent encounter for closed fracture with routine healing: Secondary | ICD-10-CM | POA: Diagnosis not present

## 2023-12-24 DIAGNOSIS — Z7983 Long term (current) use of bisphosphonates: Secondary | ICD-10-CM | POA: Diagnosis not present

## 2023-12-24 DIAGNOSIS — Z604 Social exclusion and rejection: Secondary | ICD-10-CM | POA: Diagnosis not present

## 2023-12-24 DIAGNOSIS — I10 Essential (primary) hypertension: Secondary | ICD-10-CM | POA: Diagnosis not present

## 2023-12-24 DIAGNOSIS — Z7982 Long term (current) use of aspirin: Secondary | ICD-10-CM | POA: Diagnosis not present

## 2023-12-26 DIAGNOSIS — S72322D Displaced transverse fracture of shaft of left femur, subsequent encounter for closed fracture with routine healing: Secondary | ICD-10-CM | POA: Diagnosis not present

## 2023-12-26 DIAGNOSIS — Z9181 History of falling: Secondary | ICD-10-CM | POA: Diagnosis not present

## 2023-12-26 DIAGNOSIS — G8929 Other chronic pain: Secondary | ICD-10-CM | POA: Diagnosis not present

## 2023-12-26 DIAGNOSIS — J432 Centrilobular emphysema: Secondary | ICD-10-CM | POA: Diagnosis not present

## 2023-12-26 DIAGNOSIS — Z8673 Personal history of transient ischemic attack (TIA), and cerebral infarction without residual deficits: Secondary | ICD-10-CM | POA: Diagnosis not present

## 2023-12-26 DIAGNOSIS — Z79899 Other long term (current) drug therapy: Secondary | ICD-10-CM | POA: Diagnosis not present

## 2023-12-26 DIAGNOSIS — K5900 Constipation, unspecified: Secondary | ICD-10-CM | POA: Diagnosis not present

## 2023-12-26 DIAGNOSIS — I351 Nonrheumatic aortic (valve) insufficiency: Secondary | ICD-10-CM | POA: Diagnosis not present

## 2023-12-26 DIAGNOSIS — Z993 Dependence on wheelchair: Secondary | ICD-10-CM | POA: Diagnosis not present

## 2023-12-26 DIAGNOSIS — Z7983 Long term (current) use of bisphosphonates: Secondary | ICD-10-CM | POA: Diagnosis not present

## 2023-12-26 DIAGNOSIS — E785 Hyperlipidemia, unspecified: Secondary | ICD-10-CM | POA: Diagnosis not present

## 2023-12-26 DIAGNOSIS — H548 Legal blindness, as defined in USA: Secondary | ICD-10-CM | POA: Diagnosis not present

## 2023-12-26 DIAGNOSIS — I10 Essential (primary) hypertension: Secondary | ICD-10-CM | POA: Diagnosis not present

## 2023-12-26 DIAGNOSIS — Z7982 Long term (current) use of aspirin: Secondary | ICD-10-CM | POA: Diagnosis not present

## 2023-12-26 DIAGNOSIS — Z556 Problems related to health literacy: Secondary | ICD-10-CM | POA: Diagnosis not present

## 2023-12-26 DIAGNOSIS — Z79891 Long term (current) use of opiate analgesic: Secondary | ICD-10-CM | POA: Diagnosis not present

## 2023-12-26 DIAGNOSIS — K219 Gastro-esophageal reflux disease without esophagitis: Secondary | ICD-10-CM | POA: Diagnosis not present

## 2023-12-26 DIAGNOSIS — Z604 Social exclusion and rejection: Secondary | ICD-10-CM | POA: Diagnosis not present

## 2023-12-31 DIAGNOSIS — S72322D Displaced transverse fracture of shaft of left femur, subsequent encounter for closed fracture with routine healing: Secondary | ICD-10-CM | POA: Diagnosis not present

## 2023-12-31 DIAGNOSIS — G8929 Other chronic pain: Secondary | ICD-10-CM | POA: Diagnosis not present

## 2023-12-31 DIAGNOSIS — Z8673 Personal history of transient ischemic attack (TIA), and cerebral infarction without residual deficits: Secondary | ICD-10-CM | POA: Diagnosis not present

## 2023-12-31 DIAGNOSIS — K5900 Constipation, unspecified: Secondary | ICD-10-CM | POA: Diagnosis not present

## 2023-12-31 DIAGNOSIS — Z993 Dependence on wheelchair: Secondary | ICD-10-CM | POA: Diagnosis not present

## 2023-12-31 DIAGNOSIS — I10 Essential (primary) hypertension: Secondary | ICD-10-CM | POA: Diagnosis not present

## 2023-12-31 DIAGNOSIS — Z79891 Long term (current) use of opiate analgesic: Secondary | ICD-10-CM | POA: Diagnosis not present

## 2023-12-31 DIAGNOSIS — K219 Gastro-esophageal reflux disease without esophagitis: Secondary | ICD-10-CM | POA: Diagnosis not present

## 2023-12-31 DIAGNOSIS — Z7983 Long term (current) use of bisphosphonates: Secondary | ICD-10-CM | POA: Diagnosis not present

## 2023-12-31 DIAGNOSIS — Z9181 History of falling: Secondary | ICD-10-CM | POA: Diagnosis not present

## 2023-12-31 DIAGNOSIS — J432 Centrilobular emphysema: Secondary | ICD-10-CM | POA: Diagnosis not present

## 2023-12-31 DIAGNOSIS — Z556 Problems related to health literacy: Secondary | ICD-10-CM | POA: Diagnosis not present

## 2023-12-31 DIAGNOSIS — Z79899 Other long term (current) drug therapy: Secondary | ICD-10-CM | POA: Diagnosis not present

## 2023-12-31 DIAGNOSIS — H548 Legal blindness, as defined in USA: Secondary | ICD-10-CM | POA: Diagnosis not present

## 2023-12-31 DIAGNOSIS — I351 Nonrheumatic aortic (valve) insufficiency: Secondary | ICD-10-CM | POA: Diagnosis not present

## 2023-12-31 DIAGNOSIS — E785 Hyperlipidemia, unspecified: Secondary | ICD-10-CM | POA: Diagnosis not present

## 2023-12-31 DIAGNOSIS — Z604 Social exclusion and rejection: Secondary | ICD-10-CM | POA: Diagnosis not present

## 2023-12-31 DIAGNOSIS — Z7982 Long term (current) use of aspirin: Secondary | ICD-10-CM | POA: Diagnosis not present

## 2024-01-02 DIAGNOSIS — G8929 Other chronic pain: Secondary | ICD-10-CM | POA: Diagnosis not present

## 2024-01-02 DIAGNOSIS — Z9181 History of falling: Secondary | ICD-10-CM | POA: Diagnosis not present

## 2024-01-02 DIAGNOSIS — Z993 Dependence on wheelchair: Secondary | ICD-10-CM | POA: Diagnosis not present

## 2024-01-02 DIAGNOSIS — H548 Legal blindness, as defined in USA: Secondary | ICD-10-CM | POA: Diagnosis not present

## 2024-01-02 DIAGNOSIS — J432 Centrilobular emphysema: Secondary | ICD-10-CM | POA: Diagnosis not present

## 2024-01-02 DIAGNOSIS — Z79891 Long term (current) use of opiate analgesic: Secondary | ICD-10-CM | POA: Diagnosis not present

## 2024-01-02 DIAGNOSIS — Z7982 Long term (current) use of aspirin: Secondary | ICD-10-CM | POA: Diagnosis not present

## 2024-01-02 DIAGNOSIS — I351 Nonrheumatic aortic (valve) insufficiency: Secondary | ICD-10-CM | POA: Diagnosis not present

## 2024-01-02 DIAGNOSIS — Z7983 Long term (current) use of bisphosphonates: Secondary | ICD-10-CM | POA: Diagnosis not present

## 2024-01-02 DIAGNOSIS — K219 Gastro-esophageal reflux disease without esophagitis: Secondary | ICD-10-CM | POA: Diagnosis not present

## 2024-01-02 DIAGNOSIS — Z8673 Personal history of transient ischemic attack (TIA), and cerebral infarction without residual deficits: Secondary | ICD-10-CM | POA: Diagnosis not present

## 2024-01-02 DIAGNOSIS — Z604 Social exclusion and rejection: Secondary | ICD-10-CM | POA: Diagnosis not present

## 2024-01-02 DIAGNOSIS — Z556 Problems related to health literacy: Secondary | ICD-10-CM | POA: Diagnosis not present

## 2024-01-02 DIAGNOSIS — E785 Hyperlipidemia, unspecified: Secondary | ICD-10-CM | POA: Diagnosis not present

## 2024-01-02 DIAGNOSIS — Z79899 Other long term (current) drug therapy: Secondary | ICD-10-CM | POA: Diagnosis not present

## 2024-01-02 DIAGNOSIS — I10 Essential (primary) hypertension: Secondary | ICD-10-CM | POA: Diagnosis not present

## 2024-01-02 DIAGNOSIS — S72322D Displaced transverse fracture of shaft of left femur, subsequent encounter for closed fracture with routine healing: Secondary | ICD-10-CM | POA: Diagnosis not present

## 2024-01-02 DIAGNOSIS — K5900 Constipation, unspecified: Secondary | ICD-10-CM | POA: Diagnosis not present

## 2024-01-10 DIAGNOSIS — I10 Essential (primary) hypertension: Secondary | ICD-10-CM | POA: Diagnosis not present

## 2024-01-10 DIAGNOSIS — Z9181 History of falling: Secondary | ICD-10-CM | POA: Diagnosis not present

## 2024-01-10 DIAGNOSIS — Z556 Problems related to health literacy: Secondary | ICD-10-CM | POA: Diagnosis not present

## 2024-01-10 DIAGNOSIS — Z993 Dependence on wheelchair: Secondary | ICD-10-CM | POA: Diagnosis not present

## 2024-01-10 DIAGNOSIS — K5900 Constipation, unspecified: Secondary | ICD-10-CM | POA: Diagnosis not present

## 2024-01-10 DIAGNOSIS — E785 Hyperlipidemia, unspecified: Secondary | ICD-10-CM | POA: Diagnosis not present

## 2024-01-10 DIAGNOSIS — Z79899 Other long term (current) drug therapy: Secondary | ICD-10-CM | POA: Diagnosis not present

## 2024-01-10 DIAGNOSIS — Z79891 Long term (current) use of opiate analgesic: Secondary | ICD-10-CM | POA: Diagnosis not present

## 2024-01-10 DIAGNOSIS — S72322D Displaced transverse fracture of shaft of left femur, subsequent encounter for closed fracture with routine healing: Secondary | ICD-10-CM | POA: Diagnosis not present

## 2024-01-10 DIAGNOSIS — Z604 Social exclusion and rejection: Secondary | ICD-10-CM | POA: Diagnosis not present

## 2024-01-10 DIAGNOSIS — K219 Gastro-esophageal reflux disease without esophagitis: Secondary | ICD-10-CM | POA: Diagnosis not present

## 2024-01-10 DIAGNOSIS — Z7982 Long term (current) use of aspirin: Secondary | ICD-10-CM | POA: Diagnosis not present

## 2024-01-10 DIAGNOSIS — Z8673 Personal history of transient ischemic attack (TIA), and cerebral infarction without residual deficits: Secondary | ICD-10-CM | POA: Diagnosis not present

## 2024-01-10 DIAGNOSIS — G8929 Other chronic pain: Secondary | ICD-10-CM | POA: Diagnosis not present

## 2024-01-10 DIAGNOSIS — I351 Nonrheumatic aortic (valve) insufficiency: Secondary | ICD-10-CM | POA: Diagnosis not present

## 2024-01-10 DIAGNOSIS — Z7983 Long term (current) use of bisphosphonates: Secondary | ICD-10-CM | POA: Diagnosis not present

## 2024-01-10 DIAGNOSIS — H548 Legal blindness, as defined in USA: Secondary | ICD-10-CM | POA: Diagnosis not present

## 2024-01-10 DIAGNOSIS — J432 Centrilobular emphysema: Secondary | ICD-10-CM | POA: Diagnosis not present

## 2024-01-23 ENCOUNTER — Telehealth: Payer: Self-pay

## 2024-01-23 NOTE — Telephone Encounter (Signed)
 Please review. Sent to the wrong office.  KP  Copied from CRM [id number]. Topic: Clinical - Home Health Verbal Orders >> Jan 23, 2024  9:29 AM Izetta Dakin wrote: Caller/Agency: Kimberly/ [facility] Callback Number: [phone] Service Requested: Social Worker for placement Frequency:  Any new concerns about the patient? Patient's spouse had brain bleed and no longer able to care for patient

## 2024-01-23 NOTE — Telephone Encounter (Signed)
 Pamela Ball okay for verbal orders per Dr. Kirtland Bouchard. No further questions or concerns needed at this time.

## 2024-01-23 NOTE — Telephone Encounter (Signed)
 Okay for verbal orders  [name], DO [facility] 01/23/2024, 12:10 PM

## 2024-01-24 DIAGNOSIS — Z79899 Other long term (current) drug therapy: Secondary | ICD-10-CM | POA: Diagnosis not present

## 2024-01-24 DIAGNOSIS — Z604 Social exclusion and rejection: Secondary | ICD-10-CM | POA: Diagnosis not present

## 2024-01-24 DIAGNOSIS — J432 Centrilobular emphysema: Secondary | ICD-10-CM | POA: Diagnosis not present

## 2024-01-24 DIAGNOSIS — Z8673 Personal history of transient ischemic attack (TIA), and cerebral infarction without residual deficits: Secondary | ICD-10-CM | POA: Diagnosis not present

## 2024-01-24 DIAGNOSIS — Z9181 History of falling: Secondary | ICD-10-CM | POA: Diagnosis not present

## 2024-01-24 DIAGNOSIS — K5900 Constipation, unspecified: Secondary | ICD-10-CM | POA: Diagnosis not present

## 2024-01-24 DIAGNOSIS — E785 Hyperlipidemia, unspecified: Secondary | ICD-10-CM | POA: Diagnosis not present

## 2024-01-24 DIAGNOSIS — K219 Gastro-esophageal reflux disease without esophagitis: Secondary | ICD-10-CM | POA: Diagnosis not present

## 2024-01-24 DIAGNOSIS — Z7982 Long term (current) use of aspirin: Secondary | ICD-10-CM | POA: Diagnosis not present

## 2024-01-24 DIAGNOSIS — Z993 Dependence on wheelchair: Secondary | ICD-10-CM | POA: Diagnosis not present

## 2024-01-24 DIAGNOSIS — H548 Legal blindness, as defined in USA: Secondary | ICD-10-CM | POA: Diagnosis not present

## 2024-01-24 DIAGNOSIS — I351 Nonrheumatic aortic (valve) insufficiency: Secondary | ICD-10-CM | POA: Diagnosis not present

## 2024-01-24 DIAGNOSIS — Z556 Problems related to health literacy: Secondary | ICD-10-CM | POA: Diagnosis not present

## 2024-01-24 DIAGNOSIS — Z7983 Long term (current) use of bisphosphonates: Secondary | ICD-10-CM | POA: Diagnosis not present

## 2024-01-24 DIAGNOSIS — Z79891 Long term (current) use of opiate analgesic: Secondary | ICD-10-CM | POA: Diagnosis not present

## 2024-01-24 DIAGNOSIS — S72322D Displaced transverse fracture of shaft of left femur, subsequent encounter for closed fracture with routine healing: Secondary | ICD-10-CM | POA: Diagnosis not present

## 2024-01-24 DIAGNOSIS — I10 Essential (primary) hypertension: Secondary | ICD-10-CM | POA: Diagnosis not present

## 2024-01-24 DIAGNOSIS — G8929 Other chronic pain: Secondary | ICD-10-CM | POA: Diagnosis not present

## 2024-01-25 NOTE — Home Health (Signed)
 Recertification: Patient continues to require skilled PT svcs working on advancing BLE strength to help maintain stability when transferring to<>from various surfaces and walk longer distances reducing assistance required from family. Patients husband has recently been admitted to hospital after being life flighted due to a brain bleed and he was her primary caregiver. Patient is now staying with her sister in laws who are working to figure out medications and level of assistance patient requires however they are not able to care for her 24/7 for much longer due to working full time themselves. PT has requested social work to assist with possible placement as this was requested by family in order for Ms. Melendrez to continue getting 24/7 care. SW order was approved and will be initaited next week. Pt was able to transfer today with min A and cues for best hand/foot placement to assist. Therapist provided VC for proper technique when walking 2 reps of 5ft w/RW and min A with wheelchair follow requiring a rest break in between. She maintains a 12 on the Barthel Index as she did at prior reassessment. She tolerated session well and therapist to continue services at 1w4 to work on Air cabin crew and strenghtening again to reduce assist patient requires. Patient and sister in laws are in agreement at this time.

## 2024-01-29 DIAGNOSIS — K5900 Constipation, unspecified: Secondary | ICD-10-CM | POA: Diagnosis not present

## 2024-01-29 DIAGNOSIS — Z7982 Long term (current) use of aspirin: Secondary | ICD-10-CM | POA: Diagnosis not present

## 2024-01-29 DIAGNOSIS — I10 Essential (primary) hypertension: Secondary | ICD-10-CM | POA: Diagnosis not present

## 2024-01-29 DIAGNOSIS — H548 Legal blindness, as defined in USA: Secondary | ICD-10-CM | POA: Diagnosis not present

## 2024-01-29 DIAGNOSIS — Z604 Social exclusion and rejection: Secondary | ICD-10-CM | POA: Diagnosis not present

## 2024-01-29 DIAGNOSIS — Z9181 History of falling: Secondary | ICD-10-CM | POA: Diagnosis not present

## 2024-01-29 DIAGNOSIS — G8929 Other chronic pain: Secondary | ICD-10-CM | POA: Diagnosis not present

## 2024-01-29 DIAGNOSIS — Z79899 Other long term (current) drug therapy: Secondary | ICD-10-CM | POA: Diagnosis not present

## 2024-01-29 DIAGNOSIS — I351 Nonrheumatic aortic (valve) insufficiency: Secondary | ICD-10-CM | POA: Diagnosis not present

## 2024-01-29 DIAGNOSIS — E785 Hyperlipidemia, unspecified: Secondary | ICD-10-CM | POA: Diagnosis not present

## 2024-01-29 DIAGNOSIS — Z556 Problems related to health literacy: Secondary | ICD-10-CM | POA: Diagnosis not present

## 2024-01-29 DIAGNOSIS — Z7983 Long term (current) use of bisphosphonates: Secondary | ICD-10-CM | POA: Diagnosis not present

## 2024-01-29 DIAGNOSIS — K219 Gastro-esophageal reflux disease without esophagitis: Secondary | ICD-10-CM | POA: Diagnosis not present

## 2024-01-29 DIAGNOSIS — Z993 Dependence on wheelchair: Secondary | ICD-10-CM | POA: Diagnosis not present

## 2024-01-29 DIAGNOSIS — J432 Centrilobular emphysema: Secondary | ICD-10-CM | POA: Diagnosis not present

## 2024-01-29 DIAGNOSIS — S72322D Displaced transverse fracture of shaft of left femur, subsequent encounter for closed fracture with routine healing: Secondary | ICD-10-CM | POA: Diagnosis not present

## 2024-01-29 DIAGNOSIS — Z8673 Personal history of transient ischemic attack (TIA), and cerebral infarction without residual deficits: Secondary | ICD-10-CM | POA: Diagnosis not present

## 2024-02-03 NOTE — Home Health (Signed)
 MSW received voice mail message from [place], Sister in-law and caregiver of Pamela Ball, patient that's home visit scheduled on 02/01/2024 will need to be rescheduled for the following week due to. She will be helping Jaimi's husband with move to rehab facility and with meeting medical staff at facility.

## 2024-02-04 DIAGNOSIS — Z9181 History of falling: Secondary | ICD-10-CM | POA: Diagnosis not present

## 2024-02-04 DIAGNOSIS — G8929 Other chronic pain: Secondary | ICD-10-CM | POA: Diagnosis not present

## 2024-02-04 DIAGNOSIS — K219 Gastro-esophageal reflux disease without esophagitis: Secondary | ICD-10-CM | POA: Diagnosis not present

## 2024-02-04 DIAGNOSIS — Z7983 Long term (current) use of bisphosphonates: Secondary | ICD-10-CM | POA: Diagnosis not present

## 2024-02-04 DIAGNOSIS — Z604 Social exclusion and rejection: Secondary | ICD-10-CM | POA: Diagnosis not present

## 2024-02-04 DIAGNOSIS — E785 Hyperlipidemia, unspecified: Secondary | ICD-10-CM | POA: Diagnosis not present

## 2024-02-04 DIAGNOSIS — Z993 Dependence on wheelchair: Secondary | ICD-10-CM | POA: Diagnosis not present

## 2024-02-04 DIAGNOSIS — Z7982 Long term (current) use of aspirin: Secondary | ICD-10-CM | POA: Diagnosis not present

## 2024-02-04 DIAGNOSIS — S72322D Displaced transverse fracture of shaft of left femur, subsequent encounter for closed fracture with routine healing: Secondary | ICD-10-CM | POA: Diagnosis not present

## 2024-02-04 DIAGNOSIS — H548 Legal blindness, as defined in USA: Secondary | ICD-10-CM | POA: Diagnosis not present

## 2024-02-04 DIAGNOSIS — Z8673 Personal history of transient ischemic attack (TIA), and cerebral infarction without residual deficits: Secondary | ICD-10-CM | POA: Diagnosis not present

## 2024-02-04 DIAGNOSIS — J432 Centrilobular emphysema: Secondary | ICD-10-CM | POA: Diagnosis not present

## 2024-02-04 DIAGNOSIS — Z556 Problems related to health literacy: Secondary | ICD-10-CM | POA: Diagnosis not present

## 2024-02-04 DIAGNOSIS — K5900 Constipation, unspecified: Secondary | ICD-10-CM | POA: Diagnosis not present

## 2024-02-04 DIAGNOSIS — Z79899 Other long term (current) drug therapy: Secondary | ICD-10-CM | POA: Diagnosis not present

## 2024-02-04 DIAGNOSIS — I351 Nonrheumatic aortic (valve) insufficiency: Secondary | ICD-10-CM | POA: Diagnosis not present

## 2024-02-04 DIAGNOSIS — I10 Essential (primary) hypertension: Secondary | ICD-10-CM | POA: Diagnosis not present

## 2024-02-08 DIAGNOSIS — E785 Hyperlipidemia, unspecified: Secondary | ICD-10-CM | POA: Diagnosis not present

## 2024-02-08 DIAGNOSIS — Z604 Social exclusion and rejection: Secondary | ICD-10-CM | POA: Diagnosis not present

## 2024-02-08 DIAGNOSIS — K5900 Constipation, unspecified: Secondary | ICD-10-CM | POA: Diagnosis not present

## 2024-02-08 DIAGNOSIS — J432 Centrilobular emphysema: Secondary | ICD-10-CM | POA: Diagnosis not present

## 2024-02-08 DIAGNOSIS — I10 Essential (primary) hypertension: Secondary | ICD-10-CM | POA: Diagnosis not present

## 2024-02-08 DIAGNOSIS — Z993 Dependence on wheelchair: Secondary | ICD-10-CM | POA: Diagnosis not present

## 2024-02-08 DIAGNOSIS — K219 Gastro-esophageal reflux disease without esophagitis: Secondary | ICD-10-CM | POA: Diagnosis not present

## 2024-02-08 DIAGNOSIS — G8929 Other chronic pain: Secondary | ICD-10-CM | POA: Diagnosis not present

## 2024-02-08 DIAGNOSIS — Z7982 Long term (current) use of aspirin: Secondary | ICD-10-CM | POA: Diagnosis not present

## 2024-02-08 DIAGNOSIS — Z7983 Long term (current) use of bisphosphonates: Secondary | ICD-10-CM | POA: Diagnosis not present

## 2024-02-08 DIAGNOSIS — Z9181 History of falling: Secondary | ICD-10-CM | POA: Diagnosis not present

## 2024-02-08 DIAGNOSIS — Z556 Problems related to health literacy: Secondary | ICD-10-CM | POA: Diagnosis not present

## 2024-02-08 DIAGNOSIS — S72322D Displaced transverse fracture of shaft of left femur, subsequent encounter for closed fracture with routine healing: Secondary | ICD-10-CM | POA: Diagnosis not present

## 2024-02-08 DIAGNOSIS — I351 Nonrheumatic aortic (valve) insufficiency: Secondary | ICD-10-CM | POA: Diagnosis not present

## 2024-02-08 DIAGNOSIS — Z8673 Personal history of transient ischemic attack (TIA), and cerebral infarction without residual deficits: Secondary | ICD-10-CM | POA: Diagnosis not present

## 2024-02-08 DIAGNOSIS — H548 Legal blindness, as defined in USA: Secondary | ICD-10-CM | POA: Diagnosis not present

## 2024-02-08 DIAGNOSIS — Z79899 Other long term (current) drug therapy: Secondary | ICD-10-CM | POA: Diagnosis not present

## 2024-02-10 NOTE — Home Health (Signed)
 HH MSW initial evaluation with Pt Pamela Ball), Pamela Ball, sister in-law/caregiver, and Pamela Ball, sister Architect. SW evaluation for assistance with placement. Pt is 78 yr old female admitted to [facility]  Client admitted to home health with Closed displaced transverse fracture of shaft of left femur requiring surgical repair on 09/18/23, with a non weight bearing status on left lower extremity; Primary IK:Rondzi displaced transverse fracture of shaft of left femur, initial encounter. EFY:[id number], COPD, bipolar disorder, osteoporosis  venous insufficiency of both lower extremities, aortic valve regurgitation, centrilobular emphysema, GERD (gastroesophageal reflux disease), closed displaced transverse fracture of shaft of left femur s/p ORIF and LTKA, at high risk for falls, legal blindness, chronic pain disorder, hyperlipidemia. SW confirmed :Pt's name at the onset of the evaluation. SW reiterated the purpose of the visit and role of the Child psychotherapist with home health.  Support System: Pt is supported by her sister-in-law, Pamela Ball, and another caregiver named Pamela Ball, who are currently caring for her while her husband is in rehab.  Financial Status: Pt receives SSA of $900 a month. Caregiver is helping pt with managing her bills, as her husband is in rehab.  Living Arrangements: The pt is staying in her husband's mother's home with her husband's mother, who has dementia. She is being cared for by her sister-in-law, Pamela Ball, and [place].  Medication/Medical Equipment needs/current use of DME: Pt's current DME includes a wheelchair and a walker. She is compliant with medications, and caregivers manage medications.  Cognitive Impairments/Behavioral Health Needs: Pt reports a long history of depression and has a psychiatrist but hasn't seen them lately. She experiences sadness and anxiety due to her husband's situation and changes in living arrangements. Caregivers report pt is displaying memory  concerns. MSW encouraged Pt and caregivers to schedule a follow up appointment for patient due to recent life changes.  Falls: Pt denies any recent falls.  Transportation: Caregivers provide transportation for the pt.  In Home-services/ADL's: Pt needs assistance with going to the bathroom, bathing, and using the walker due to limited arm movement. She requires moderate assistance with ADL's.  Caregiver Support: More caregiver support is needed as Pamela Ball and the other caregiver Arlyn) are also caring for their mother with dementia. Pamela Ball is on FMLA but will need to return to work soon.  Nutrition/Type of Diet: The pt denies any issues.  Sleep habits: No issues with sleep.  Home Safety: MSW observed no issues; the home is free of clutter and appropriate for the pt.  Stress Factors: The pt is stressed about her husband's potential inability to return home from rehab and the care he would need. She is also concerned about what will happen to their home if they are not there.  ADL's requiring assistance: Going to the bathroom, bathing, using the walker, and moderate assistance with other ADL's.  MSW provided additional recommendations, education, and resources: MSW discussed long-term care plans and placement options. The pt is open to placement if it is close to her husband. The caregivers, Pamela and Pamela Ball, are supportive of exploring placement options, recognizing the need for additional support given their current caregiving responsibilities and Lori's impending return to work. MSW discussed the process of having an FL2 filled out by the doctor and long-term care Medicaid. Provided a listing of nursing home facilities in the area and nearby counties and encouraged the caregiver to follow up with social services for [facility] application. Recommended consulting an elder law attorney regarding property concerns, specifically about what will happen to their home  if they move to a skilled nursing  facility. MSW will follow up with the caregiver as needed to discuss placement and ensure all necessary steps are being taken.

## 2024-02-11 DIAGNOSIS — Z604 Social exclusion and rejection: Secondary | ICD-10-CM | POA: Diagnosis not present

## 2024-02-11 DIAGNOSIS — S72322D Displaced transverse fracture of shaft of left femur, subsequent encounter for closed fracture with routine healing: Secondary | ICD-10-CM | POA: Diagnosis not present

## 2024-02-11 DIAGNOSIS — K219 Gastro-esophageal reflux disease without esophagitis: Secondary | ICD-10-CM | POA: Diagnosis not present

## 2024-02-11 DIAGNOSIS — Z9181 History of falling: Secondary | ICD-10-CM | POA: Diagnosis not present

## 2024-02-11 DIAGNOSIS — J432 Centrilobular emphysema: Secondary | ICD-10-CM | POA: Diagnosis not present

## 2024-02-11 DIAGNOSIS — Z8673 Personal history of transient ischemic attack (TIA), and cerebral infarction without residual deficits: Secondary | ICD-10-CM | POA: Diagnosis not present

## 2024-02-11 DIAGNOSIS — G8929 Other chronic pain: Secondary | ICD-10-CM | POA: Diagnosis not present

## 2024-02-11 DIAGNOSIS — K5900 Constipation, unspecified: Secondary | ICD-10-CM | POA: Diagnosis not present

## 2024-02-11 DIAGNOSIS — I351 Nonrheumatic aortic (valve) insufficiency: Secondary | ICD-10-CM | POA: Diagnosis not present

## 2024-02-11 DIAGNOSIS — Z7982 Long term (current) use of aspirin: Secondary | ICD-10-CM | POA: Diagnosis not present

## 2024-02-11 DIAGNOSIS — Z556 Problems related to health literacy: Secondary | ICD-10-CM | POA: Diagnosis not present

## 2024-02-11 DIAGNOSIS — Z7983 Long term (current) use of bisphosphonates: Secondary | ICD-10-CM | POA: Diagnosis not present

## 2024-02-11 DIAGNOSIS — E785 Hyperlipidemia, unspecified: Secondary | ICD-10-CM | POA: Diagnosis not present

## 2024-02-11 DIAGNOSIS — H548 Legal blindness, as defined in USA: Secondary | ICD-10-CM | POA: Diagnosis not present

## 2024-02-11 DIAGNOSIS — I10 Essential (primary) hypertension: Secondary | ICD-10-CM | POA: Diagnosis not present

## 2024-02-11 DIAGNOSIS — Z993 Dependence on wheelchair: Secondary | ICD-10-CM | POA: Diagnosis not present

## 2024-02-11 DIAGNOSIS — Z79899 Other long term (current) drug therapy: Secondary | ICD-10-CM | POA: Diagnosis not present

## 2024-02-19 DIAGNOSIS — Z993 Dependence on wheelchair: Secondary | ICD-10-CM | POA: Diagnosis not present

## 2024-02-19 DIAGNOSIS — Z604 Social exclusion and rejection: Secondary | ICD-10-CM | POA: Diagnosis not present

## 2024-02-19 DIAGNOSIS — Z7983 Long term (current) use of bisphosphonates: Secondary | ICD-10-CM | POA: Diagnosis not present

## 2024-02-19 DIAGNOSIS — E785 Hyperlipidemia, unspecified: Secondary | ICD-10-CM | POA: Diagnosis not present

## 2024-02-19 DIAGNOSIS — J432 Centrilobular emphysema: Secondary | ICD-10-CM | POA: Diagnosis not present

## 2024-02-19 DIAGNOSIS — Z79899 Other long term (current) drug therapy: Secondary | ICD-10-CM | POA: Diagnosis not present

## 2024-02-19 DIAGNOSIS — Z8673 Personal history of transient ischemic attack (TIA), and cerebral infarction without residual deficits: Secondary | ICD-10-CM | POA: Diagnosis not present

## 2024-02-19 DIAGNOSIS — Z9181 History of falling: Secondary | ICD-10-CM | POA: Diagnosis not present

## 2024-02-19 DIAGNOSIS — I351 Nonrheumatic aortic (valve) insufficiency: Secondary | ICD-10-CM | POA: Diagnosis not present

## 2024-02-19 DIAGNOSIS — K219 Gastro-esophageal reflux disease without esophagitis: Secondary | ICD-10-CM | POA: Diagnosis not present

## 2024-02-19 DIAGNOSIS — I10 Essential (primary) hypertension: Secondary | ICD-10-CM | POA: Diagnosis not present

## 2024-02-19 DIAGNOSIS — Z556 Problems related to health literacy: Secondary | ICD-10-CM | POA: Diagnosis not present

## 2024-02-19 DIAGNOSIS — H548 Legal blindness, as defined in USA: Secondary | ICD-10-CM | POA: Diagnosis not present

## 2024-02-19 DIAGNOSIS — S72322D Displaced transverse fracture of shaft of left femur, subsequent encounter for closed fracture with routine healing: Secondary | ICD-10-CM | POA: Diagnosis not present

## 2024-02-19 DIAGNOSIS — Z7982 Long term (current) use of aspirin: Secondary | ICD-10-CM | POA: Diagnosis not present

## 2024-02-19 DIAGNOSIS — K5900 Constipation, unspecified: Secondary | ICD-10-CM | POA: Diagnosis not present

## 2024-02-19 DIAGNOSIS — G8929 Other chronic pain: Secondary | ICD-10-CM | POA: Diagnosis not present

## 2024-02-19 NOTE — Home Health (Signed)
 30 day reassessment:  Patient has shown good progress throughout course of care being able to ambulate for longer distances today using a RW. Pt increased her distance and reps today to 128ft, and 2 reps of 70ft w/cga-min A and improved sxs of pain. Pt now has a left heel lift to assist with leg length discrepancy and she shows significant progress in weight acceptance on the L side and reduced stress/pain in her RLE. Pt improved her Barhel index score from 12 at recertification to 13 today at reassesment. Pt sister in law states she is getting up more frequently walking with the RW and reduced assistance as well. Pt would benefit from 2 additional sessions to address stair negotiation out of her sister in laws home for MD appointments and finalize HEP.

## 2024-02-21 ENCOUNTER — Ambulatory Visit: Payer: Self-pay

## 2024-02-21 NOTE — Telephone Encounter (Addendum)
 Chief Complaint: urinary symptoms Symptoms: burning with urination, flank pain, subjective fever, nausea, frequency Frequency: "this week" Pertinent Negatives: Patient denies hematuria, vomiting Disposition: [] ED /[x] Urgent Care (no appt availability in office) / [] Appointment(In office/virtual)/ []  [facility]/ [] Home Care/ [] Refused Recommended Disposition /[] [place] [place] [facility]/ []  Follow-up with PCP Additional Notes: Pt is with her sister in law [place]. This RN spoke to [place] and the pt. Pt has had burning with urination, flank pain, frequency, nausea, and a subjective fever this week. Denies hematuria and vomiting. Pt states she has not eaten since yesterday at breakfast but Kirstie Percy is not sure this is fully accurate, although Kirstie Percy has not been with the pt consistently. Pt does not have a thermometer but states she feels she has a fever. Kirstie Percy reports pt appears confused at nighttime but states this is not new. RN advised the pt and Kirstie Percy the pt needs to be seen by a HPC within 24 hours - no availability in the office during that time frame so the RN asked Kirstie Percy to take the pt to UC. Kirstie Percy reports she will take the pt to UC as soon as her sister gets home, which should be very soon. RN advised Kirstie Percy that if she pt begins vomiting, has blood in her urine, or appears very confused, as CP or SOB they need to call 911. Bonita verbalized understanding.   Copied from CRM [id number]. Topic: Clinical - Red Word Triage >> Feb 21, 2024 10:19 AM Phil Braun wrote: Red Word that prompted transfer to Nurse Triage:  Possible fever, uti, burning, kidney hurting an back pain Reason for Disposition  Side (flank) or lower back pain present  Answer Assessment - Initial Assessment Questions 1. SYMPTOM: "What's the main symptom you're concerned about?" (e.g., frequency, incontinence)     RN spoke to pt and pt's sister in law [place] - per pt, she is "Having a hard time peeing", "I think I am running  a little bit of fever, I don't have a thermometer", burning with urination, pt has the sensation of needing to urinate frequently but does not always urinate, pt also endorses 6/10 flank pain 2. ONSET: "When did the symptoms  start?"     "This week" 3. PAIN: "Is there any pain?" If Yes, ask: "How bad is it?" (Scale: 1-10; mild, moderate, severe)     7/10 4. CAUSE: "What do you think is causing the symptoms?"     UTI 5. OTHER SYMPTOMS: "Do you have any other symptoms?" (e.g., blood in urine, fever, flank pain, pain with urination)     Husband is a pt in a SNF right now, normally is Margueritte's caregiver. Sister in law stepping in. Denies hematuria. Endorses nausea but no vomiting. Pt states she is drinking normally but last ate yesterday at breakfast (Sister in law [place] questions if this is true)  Kirstie Percy states her sister is due to come home "any time now" and then she will take the pt to UC  Protocols used: Urinary Symptoms-A-AH

## 2024-02-22 ENCOUNTER — Encounter: Payer: Self-pay | Admitting: Family Medicine

## 2024-02-22 ENCOUNTER — Ambulatory Visit (INDEPENDENT_AMBULATORY_CARE_PROVIDER_SITE_OTHER): Admitting: Family Medicine

## 2024-02-22 VITALS — BP 120/64 | HR 101 | Ht <= 58 in | Wt 84.5 lb

## 2024-02-22 DIAGNOSIS — K219 Gastro-esophageal reflux disease without esophagitis: Secondary | ICD-10-CM | POA: Diagnosis not present

## 2024-02-22 DIAGNOSIS — N3001 Acute cystitis with hematuria: Secondary | ICD-10-CM | POA: Diagnosis not present

## 2024-02-22 DIAGNOSIS — R300 Dysuria: Secondary | ICD-10-CM

## 2024-02-22 DIAGNOSIS — R339 Retention of urine, unspecified: Secondary | ICD-10-CM | POA: Diagnosis not present

## 2024-02-22 DIAGNOSIS — B379 Candidiasis, unspecified: Secondary | ICD-10-CM | POA: Diagnosis not present

## 2024-02-22 DIAGNOSIS — N390 Urinary tract infection, site not specified: Secondary | ICD-10-CM

## 2024-02-22 LAB — POCT URINALYSIS DIPSTICK
Glucose, UA: NEGATIVE
Ketones, UA: 80
Nitrite, UA: NEGATIVE
Odor: POSITIVE
Protein, UA: NEGATIVE
Spec Grav, UA: 1.015 (ref 1.010–1.025)
Urobilinogen, UA: 0.2 U/dL
pH, UA: 5 (ref 5.0–8.0)

## 2024-02-22 MED ORDER — FLUCONAZOLE 150 MG PO TABS
ORAL_TABLET | ORAL | 1 refills | Status: DC
Start: 2024-02-22 — End: 2024-03-03

## 2024-02-22 MED ORDER — OMEPRAZOLE 40 MG PO CPDR: 40.0000 mg | DELAYED_RELEASE_CAPSULE | Freq: Every day | ORAL | 1 refills | Status: DC

## 2024-02-22 MED ORDER — CEPHALEXIN 500 MG PO CAPS
500.0000 mg | ORAL_CAPSULE | Freq: Three times a day (TID) | ORAL | 0 refills | Status: DC
Start: 2024-02-22 — End: 2024-03-03

## 2024-02-22 NOTE — Progress Notes (Signed)
 Subjective:    Patient ID: Pamela Ball, female    DOB: January 15, 1946, 78 y.o.   MRN: [id number]  Pamela Ball is a 78 y.o. female presenting on 02/22/2024 for Recurrent UTI  Patient presents for a same day appointment.   HPI  UTI / Dysuria History of Urinary Retention / Incomplete Emptying   The patient presents with urinary symptoms, including a burning sensation during urination and difficulty in voiding. She reports an increased urge to urinate Last visit 11/2023 for similar, treated for UTI with keflex  and it resolved, urine culture was negative. History of darker urine and difficulty voiding, has not returned to urology at [facility] yet - Required urinary catheter in past. Now does not have one. Cannot return to [facility] due to transportation limitations  GERD Recent worsening symptoms, on OTC PPI Omeprazole  20mg  daily Difficulty with posture often laying down or not sitting upright after eating  Major Depression, recurrent Generalized Anxiety Managed by Psychiatry On Venlafaxine  XR 75mg  daily, Wellbutrin  XL 150mg  daily On Zyprexa   Headaches Admits difficulty with daily headaches L sided, some rebound concerns with frequent BC dosing Not taking Tylenol  regularly for headache. No history of migraine headaches Not associated with other concerning features or symptoms of migraine  Allergic Conjunctivitis Bilateral eyes with watery itchy, matted in AM.  Recent update  Orthopedic at [facility] Dr Laurence, s/p L femur fracture repair      02/22/2024    3:18 PM 03/15/2023    9:33 AM 02/01/2023    8:51 AM  Depression screen PHQ 2/9  Decreased Interest 0 0 0  Down, Depressed, Hopeless 3 0 0  PHQ - 2 Score 3 0 0  Altered sleeping 2  0  Tired, decreased energy 3  0  Change in appetite 3  0  Feeling bad or failure about yourself  0  0  Trouble concentrating 0  0  Moving slowly or fidgety/restless 0  0  Suicidal thoughts 0  0  PHQ-9 Score 11  0  Difficult doing work/chores Not  difficult at all  Not difficult at all       02/22/2024    3:18 PM 03/15/2023    9:33 AM 02/01/2023    8:51 AM 10/19/2022   10:39 AM  GAD 7 : Generalized Anxiety Score  Nervous, Anxious, on Edge 3 0 0 0  Control/stop worrying 3 0 0 0  Worry too much - different things 1 0 0 0  Trouble relaxing 3 0 0 0  Restless 3 0 0 0  Easily annoyed or irritable 1 0 0 0  Afraid - awful might happen 0 0 0 0  Total GAD 7 Score 14 0 0 0  Anxiety Difficulty Very difficult  Not difficult at all Not difficult at all    Social History   Tobacco Use   Smoking status: Never   Smokeless tobacco: Never  Vaping Use   Vaping status: Never Used  Substance Use Topics   Alcohol use: No    Comment: drink occasionally    Drug use: No    Review of Systems Per HPI unless specifically indicated above     Objective:    BP 120/64 (BP Location: Right Arm, Patient Position: Sitting, Cuff Size: Normal)   Pulse (!) 101   Ht 4' 10 (1.473 m)   Wt 84 lb 8 oz (38.3 kg) Comment: wheelchair  SpO2 92%   BMI 17.66 kg/m   Wt Readings from Last 3 Encounters:  02/22/24  84 lb 8 oz (38.3 kg)  12/03/23 90 lb (40.8 kg)  10/05/23 91 lb (41.3 kg)    Physical Exam Vitals and nursing note reviewed.  Constitutional:      General: She is not in acute distress.    Appearance: Normal appearance. She is well-developed. She is not diaphoretic.     Comments: Well-appearing thin frail 78 yr female comfortable, cooperative  HENT:     Head: Normocephalic and atraumatic.  Eyes:     General:        Right eye: No discharge.        Left eye: No discharge.     Conjunctiva/sclera: Conjunctivae normal.  Cardiovascular:     Rate and Rhythm: Normal rate.  Pulmonary:     Effort: Pulmonary effort is normal.  Musculoskeletal:     Comments: In wheelchair  Skin:    General: Skin is warm and dry.     Findings: No erythema or rash.  Neurological:     Mental Status: She is alert and oriented to person, place, and time.   Psychiatric:        Mood and Affect: Mood normal.        Behavior: Behavior normal.        Thought Content: Thought content normal.     Comments: Well groomed, good eye contact, normal speech and thoughts     Results for orders placed or performed in visit on 02/22/24  POCT Urinalysis Dipstick   Collection Time: 02/22/24  3:01 PM  Result Value Ref Range   Color, UA orange    Clarity, UA cloudy    Glucose, UA Negative Negative   Bilirubin, UA small    Ketones, UA 80    Spec Grav, UA 1.015 1.010 - 1.025   Blood, UA trace    pH, UA 5.0 5.0 - 8.0   Protein, UA Negative Negative   Urobilinogen, UA 0.2 0.2 or 1.0 E.U./dL   Nitrite, UA negative    Leukocytes, UA Moderate (2+) (A) Negative   Appearance     Odor positive       Assessment & Plan:   Problem List Items Addressed This Visit   None Visit Diagnoses       Acute cystitis with hematuria    -  Primary   Relevant Medications   cephALEXin  (KEFLEX ) 500 MG capsule   Other Relevant Orders   Urine Culture   Ambulatory referral to Urology     Dysuria       Relevant Orders   POCT Urinalysis Dipstick (Completed)   Urine Culture     Antibiotic-induced yeast infection       Relevant Medications   cephALEXin  (KEFLEX ) 500 MG capsule   fluconazole  (DIFLUCAN ) 150 MG tablet     Gastroesophageal reflux disease       Relevant Medications   omeprazole  (PRILOSEC) 40 MG capsule     Recurrent UTI       Relevant Medications   cephALEXin  (KEFLEX ) 500 MG capsule   fluconazole  (DIFLUCAN ) 150 MG tablet   Other Relevant Orders   Ambulatory referral to Urology     Urinary retention       Relevant Orders   Ambulatory referral to Urology       UTI Confirmed on urine dipstick Recurrent concern last UTI 11/2023 prior resolved w/ Keflex  Past history of incomplete emptying / urinary retention Previously with [facility] has not returned.  - Start Keflex  500mg  3 times daily for next 7 days -  Urine culture - Improve hydration -  D-Mannose for prevention 500 TWICE A DAY Return precautions  Referral to [facility] for recurrent UTI and incomplete emptying urinary retention, switch from [facility] due to transportation difficulty  GERD Worsening flare stomach acid, on PPI OTC 20mg  Dose increase Omeprazole  from 20 to 40mg , new order sent to pharmacy or can double current.  Likely allergic conjunctivitis for the eyes Can use OTC claritin / zyrtec either one once a day OTC Anti histamine eye drops for allergies if needed Warm compress in AM  Headache For Headache, likely tension headache. Not classic for migraine and no prior history Concern rebound on BC frequent dosing - Limit BC - Try the Tylenol  Ext Strength 500mg  x 2 = 1000mg  as needed up to 3 times per day   Orders Placed This Encounter  Procedures   Urine Culture   Ambulatory referral to Urology    Referral Priority:   Routine    Referral Type:   Consultation    Referral Reason:   Specialty Services Required    Requested Specialty:   Urology    Number of Visits Requested:   1   POCT Urinalysis Dipstick    Meds ordered this encounter  Medications   cephALEXin  (KEFLEX ) 500 MG capsule    Sig: Take 1 capsule (500 mg total) by mouth 3 (three) times daily. For 7 days    Dispense:  21 capsule    Refill:  0   fluconazole  (DIFLUCAN ) 150 MG tablet    Sig: Take one tablet by mouth on Day 1. Repeat dose 2nd tablet on Day 3.    Dispense:  2 tablet    Refill:  1   omeprazole  (PRILOSEC) 40 MG capsule    Sig: Take 1 capsule (40 mg total) by mouth daily before breakfast.    Dispense:  90 capsule    Refill:  1    Follow up plan: Return if symptoms worsen or fail to improve.   Marsa Officer, DO [facility] 02/22/2024, 3:14 PM

## 2024-02-22 NOTE — Patient Instructions (Addendum)
 Thank you for coming to the office today.   1. You have a Urinary Tract Infection - this is very common, your symptoms are reassuring and you should get better within 1 week on the antibiotics - Start Keflex  500mg  3 times daily for next 7 days, complete entire course, even if feeling better - We sent urine for a culture, we will call you within next few days if we need to change antibiotics - Please drink plenty of fluids, improve hydration over next 1 week  If symptoms worsening, developing nausea / vomiting, worsening back pain, fevers / chills / sweats, then please return for re-evaluation sooner.  If you take AZO OTC - limit this to 2-3 days MAX to avoid affecting kidneys  D-Mannose is a natural supplement that can actually help bind to urinary bacteria and reduce their effectiveness it can help prevent UTI from forming, and may reduce some symptoms. It likely cannot cure an active UTI but it is worth a try and good to prevent them with. Try 500mg  twice a day at a full dose if you want, or check package instructions for more info  Referral to Urologist  [facility] -1st floor [address] [place],  [place]  [address] Phone: [phone]   For stomach acid Dose increase Omeprazole  from 20 to 40mg , new order sent to pharmacy or can double current.  --------------------------------------  Likely allergic conjunctivitis for the eyes Can use OTC claritin / zyrtec either one once a day OTC Anti histamine eye drops for allergies if needed Warm compress in AM  For Headache - Mix and match, try to avoid too much BC - Try the Tylenol  Ext Strength 500mg  x 2 = 1000mg  as needed up to 3 times per day Can develop rebound headache with BC / caffeine can cause more headaches than they help  Please schedule a Follow-up Appointment to: Return if symptoms worsen or fail to improve.  If you have any other questions or concerns, please feel free  to call the office or send a message through MyChart. You may also schedule an earlier appointment if necessary.  Additionally, you may be receiving a survey about your experience at our office within a few days to 1 week by e-mail or mail. We value your feedback.  Marsa Officer, DO [facility], [place]

## 2024-02-23 LAB — URINE CULTURE
MICRO NUMBER:: 16348776
Result:: NO GROWTH
SPECIMEN QUALITY:: ADEQUATE

## 2024-03-03 ENCOUNTER — Other Ambulatory Visit: Payer: Self-pay | Admitting: Family Medicine

## 2024-03-03 DIAGNOSIS — E785 Hyperlipidemia, unspecified: Secondary | ICD-10-CM | POA: Diagnosis not present

## 2024-03-03 DIAGNOSIS — J432 Centrilobular emphysema: Secondary | ICD-10-CM | POA: Diagnosis not present

## 2024-03-03 DIAGNOSIS — I10 Essential (primary) hypertension: Secondary | ICD-10-CM | POA: Diagnosis not present

## 2024-03-03 DIAGNOSIS — Z8673 Personal history of transient ischemic attack (TIA), and cerebral infarction without residual deficits: Secondary | ICD-10-CM | POA: Diagnosis not present

## 2024-03-03 DIAGNOSIS — K219 Gastro-esophageal reflux disease without esophagitis: Secondary | ICD-10-CM | POA: Diagnosis not present

## 2024-03-03 DIAGNOSIS — G8929 Other chronic pain: Secondary | ICD-10-CM | POA: Diagnosis not present

## 2024-03-03 DIAGNOSIS — S72322D Displaced transverse fracture of shaft of left femur, subsequent encounter for closed fracture with routine healing: Secondary | ICD-10-CM | POA: Diagnosis not present

## 2024-03-03 DIAGNOSIS — K5900 Constipation, unspecified: Secondary | ICD-10-CM | POA: Diagnosis not present

## 2024-03-03 DIAGNOSIS — H548 Legal blindness, as defined in USA: Secondary | ICD-10-CM | POA: Diagnosis not present

## 2024-03-03 DIAGNOSIS — I351 Nonrheumatic aortic (valve) insufficiency: Secondary | ICD-10-CM | POA: Diagnosis not present

## 2024-03-03 NOTE — Home Health (Signed)
 T/C with [name] Miracle, sister in law/primary caregiver for patient while Pt's husband is in rehab facility. Caregiver states Pt is showing some improvement with PT. Caregiver reports her sister Katheryn who is out of work on [employer] to care for Pt and her mother with dementia states her FMLA ends in June. Caregiver states that she is in process of applying for Long term care medicaid for Pt and her brother Tinnie Kunin, patients spouse). SW confirmed that FL2 was faxed to fill out Dr[facility] Cloyd [facility].  Caregiver dicussed the need for a respite break due to caring for Pt and her mother with dementia at same time. Caregiver reports her sister Katheryn needs a break because patient needs help with multiple ADL's such as ambulating and incontinence which requires someone to be in the home. SW dicussed  respite options such as Family caregiver grants through [employer] and provided her with contact number and completed referral for [facility] respite program. Caregiver reports that Pt's husband is making progress in facility and its unknown if and or when he will be able to come home.  SW also provided education on [facility] as a additional option should she have support in home. Caregiver agreed to referral to [facility] to get more information about program.

## 2024-03-03 NOTE — Home Health (Signed)
 HH MSW faxed to [facility] and completed referral for [facility] to get extra caregiver support in home for caregiver.

## 2024-03-07 DIAGNOSIS — E785 Hyperlipidemia, unspecified: Secondary | ICD-10-CM | POA: Diagnosis not present

## 2024-03-07 DIAGNOSIS — Z556 Problems related to health literacy: Secondary | ICD-10-CM | POA: Diagnosis not present

## 2024-03-07 DIAGNOSIS — Z7983 Long term (current) use of bisphosphonates: Secondary | ICD-10-CM | POA: Diagnosis not present

## 2024-03-07 DIAGNOSIS — G8929 Other chronic pain: Secondary | ICD-10-CM | POA: Diagnosis not present

## 2024-03-07 DIAGNOSIS — Z7982 Long term (current) use of aspirin: Secondary | ICD-10-CM | POA: Diagnosis not present

## 2024-03-07 DIAGNOSIS — K5900 Constipation, unspecified: Secondary | ICD-10-CM | POA: Diagnosis not present

## 2024-03-07 DIAGNOSIS — J432 Centrilobular emphysema: Secondary | ICD-10-CM | POA: Diagnosis not present

## 2024-03-07 DIAGNOSIS — S72322D Displaced transverse fracture of shaft of left femur, subsequent encounter for closed fracture with routine healing: Secondary | ICD-10-CM | POA: Diagnosis not present

## 2024-03-07 DIAGNOSIS — Z993 Dependence on wheelchair: Secondary | ICD-10-CM | POA: Diagnosis not present

## 2024-03-07 DIAGNOSIS — K219 Gastro-esophageal reflux disease without esophagitis: Secondary | ICD-10-CM | POA: Diagnosis not present

## 2024-03-07 DIAGNOSIS — Z9181 History of falling: Secondary | ICD-10-CM | POA: Diagnosis not present

## 2024-03-07 DIAGNOSIS — Z604 Social exclusion and rejection: Secondary | ICD-10-CM | POA: Diagnosis not present

## 2024-03-07 DIAGNOSIS — Z79899 Other long term (current) drug therapy: Secondary | ICD-10-CM | POA: Diagnosis not present

## 2024-03-07 DIAGNOSIS — I351 Nonrheumatic aortic (valve) insufficiency: Secondary | ICD-10-CM | POA: Diagnosis not present

## 2024-03-07 DIAGNOSIS — Z8673 Personal history of transient ischemic attack (TIA), and cerebral infarction without residual deficits: Secondary | ICD-10-CM | POA: Diagnosis not present

## 2024-03-07 DIAGNOSIS — I10 Essential (primary) hypertension: Secondary | ICD-10-CM | POA: Diagnosis not present

## 2024-03-07 DIAGNOSIS — H548 Legal blindness, as defined in USA: Secondary | ICD-10-CM | POA: Diagnosis not present

## 2024-03-11 ENCOUNTER — Telehealth: Payer: Self-pay

## 2024-03-11 NOTE — Telephone Encounter (Signed)
 Copied from CRM [id number]. Topic: General - Other >> Mar 11, 2024 11:00 AM Rosamond Comes wrote: [facility] Home Health phone [phone]  returning call from [place] regarding FL2 Form

## 2024-03-14 DIAGNOSIS — Z8673 Personal history of transient ischemic attack (TIA), and cerebral infarction without residual deficits: Secondary | ICD-10-CM | POA: Diagnosis not present

## 2024-03-14 DIAGNOSIS — Z9181 History of falling: Secondary | ICD-10-CM | POA: Diagnosis not present

## 2024-03-14 DIAGNOSIS — I351 Nonrheumatic aortic (valve) insufficiency: Secondary | ICD-10-CM | POA: Diagnosis not present

## 2024-03-14 DIAGNOSIS — Z604 Social exclusion and rejection: Secondary | ICD-10-CM | POA: Diagnosis not present

## 2024-03-14 DIAGNOSIS — Z556 Problems related to health literacy: Secondary | ICD-10-CM | POA: Diagnosis not present

## 2024-03-14 DIAGNOSIS — J432 Centrilobular emphysema: Secondary | ICD-10-CM | POA: Diagnosis not present

## 2024-03-14 DIAGNOSIS — Z7983 Long term (current) use of bisphosphonates: Secondary | ICD-10-CM | POA: Diagnosis not present

## 2024-03-14 DIAGNOSIS — K5900 Constipation, unspecified: Secondary | ICD-10-CM | POA: Diagnosis not present

## 2024-03-14 DIAGNOSIS — I10 Essential (primary) hypertension: Secondary | ICD-10-CM | POA: Diagnosis not present

## 2024-03-14 DIAGNOSIS — S72322D Displaced transverse fracture of shaft of left femur, subsequent encounter for closed fracture with routine healing: Secondary | ICD-10-CM | POA: Diagnosis not present

## 2024-03-14 DIAGNOSIS — K219 Gastro-esophageal reflux disease without esophagitis: Secondary | ICD-10-CM | POA: Diagnosis not present

## 2024-03-14 DIAGNOSIS — H548 Legal blindness, as defined in USA: Secondary | ICD-10-CM | POA: Diagnosis not present

## 2024-03-14 DIAGNOSIS — Z7982 Long term (current) use of aspirin: Secondary | ICD-10-CM | POA: Diagnosis not present

## 2024-03-14 DIAGNOSIS — Z79899 Other long term (current) drug therapy: Secondary | ICD-10-CM | POA: Diagnosis not present

## 2024-03-14 DIAGNOSIS — Z993 Dependence on wheelchair: Secondary | ICD-10-CM | POA: Diagnosis not present

## 2024-03-14 DIAGNOSIS — G8929 Other chronic pain: Secondary | ICD-10-CM | POA: Diagnosis not present

## 2024-03-14 DIAGNOSIS — E785 Hyperlipidemia, unspecified: Secondary | ICD-10-CM | POA: Diagnosis not present

## 2024-03-14 NOTE — Home Health (Addendum)
 Attempt made to contact Dr. Laurence again with request for orders as he has provided them in the past however no return communication. PT to work on contacting Dr. Lia PCP to receive authorization to continue her orders to focus on BLE strengthening and gait training now that she has transferred over to her sister in laws home which is a differnt environment for her and poses new challenges.

## 2024-03-14 NOTE — Home Health (Signed)
 Physical Therapist needed to contact Dr. Lia' office to obtain orders for continued therapy visits of 1w3 to finalize HEP and walking program as no response was provided by Dr. Laurence. Dr. Lia office provided verbal authorization to continue on 03/06/2024 at 10am.

## 2024-03-14 NOTE — Home Health (Signed)
 LATE ENTRY No return communication from Dr. Laurence and additional orders now working on getting a response from patients PCP Dr. Lia to approve additional visits.   Suzen Spar, PT, DPT

## 2024-03-14 NOTE — Home Health (Addendum)
 LATE ENTRY Physical Therapist notified sister in law Pamela Ball of the delay in services to return due to awaiting return communication from MD. [name] responded with thank you for the update. Therapist continuing to obtain orders to finalize HEP and walking programs in preparation for DC.

## 2024-03-20 DIAGNOSIS — I351 Nonrheumatic aortic (valve) insufficiency: Secondary | ICD-10-CM | POA: Diagnosis not present

## 2024-03-20 DIAGNOSIS — Z9181 History of falling: Secondary | ICD-10-CM | POA: Diagnosis not present

## 2024-03-20 DIAGNOSIS — Z8673 Personal history of transient ischemic attack (TIA), and cerebral infarction without residual deficits: Secondary | ICD-10-CM | POA: Diagnosis not present

## 2024-03-20 DIAGNOSIS — K5900 Constipation, unspecified: Secondary | ICD-10-CM | POA: Diagnosis not present

## 2024-03-20 DIAGNOSIS — G8929 Other chronic pain: Secondary | ICD-10-CM | POA: Diagnosis not present

## 2024-03-20 DIAGNOSIS — S72322D Displaced transverse fracture of shaft of left femur, subsequent encounter for closed fracture with routine healing: Secondary | ICD-10-CM | POA: Diagnosis not present

## 2024-03-20 DIAGNOSIS — E785 Hyperlipidemia, unspecified: Secondary | ICD-10-CM | POA: Diagnosis not present

## 2024-03-20 DIAGNOSIS — Z79899 Other long term (current) drug therapy: Secondary | ICD-10-CM | POA: Diagnosis not present

## 2024-03-20 DIAGNOSIS — Z604 Social exclusion and rejection: Secondary | ICD-10-CM | POA: Diagnosis not present

## 2024-03-20 DIAGNOSIS — Z993 Dependence on wheelchair: Secondary | ICD-10-CM | POA: Diagnosis not present

## 2024-03-20 DIAGNOSIS — Z556 Problems related to health literacy: Secondary | ICD-10-CM | POA: Diagnosis not present

## 2024-03-20 DIAGNOSIS — H548 Legal blindness, as defined in USA: Secondary | ICD-10-CM | POA: Diagnosis not present

## 2024-03-20 DIAGNOSIS — I10 Essential (primary) hypertension: Secondary | ICD-10-CM | POA: Diagnosis not present

## 2024-03-20 DIAGNOSIS — Z7982 Long term (current) use of aspirin: Secondary | ICD-10-CM | POA: Diagnosis not present

## 2024-03-20 DIAGNOSIS — K219 Gastro-esophageal reflux disease without esophagitis: Secondary | ICD-10-CM | POA: Diagnosis not present

## 2024-03-20 DIAGNOSIS — J432 Centrilobular emphysema: Secondary | ICD-10-CM | POA: Diagnosis not present

## 2024-03-20 DIAGNOSIS — Z7983 Long term (current) use of bisphosphonates: Secondary | ICD-10-CM | POA: Diagnosis not present

## 2024-03-21 ENCOUNTER — Other Ambulatory Visit: Payer: Self-pay | Admitting: Family Medicine

## 2024-03-21 DIAGNOSIS — M810 Age-related osteoporosis without current pathological fracture: Secondary | ICD-10-CM

## 2024-03-22 NOTE — Home Health (Signed)
 Patient has made good progress overall throughout course of care. She shows a good ability to ambulate in her home on level surfaces using a RW and a specialized shoe lift to help equal leg length and facilitate improved balance. She also demonstrates improved ability to transfer sit<>stand from wheelchair and to diffierent surfaces w/new lower height walker and supv-cga. Pt has improved Barthel score from 12 at last reassessment to 14 indicating improved ability to complete every day activities and reduced risk of fall. She was able to ambulate 2 reps of 142ft today with wheelchair follow for safety and RW w/supv. Pt reports no increased pain with interventions today. Pt/caregiver are in understanding of discharge from HHPT at this time with max rehab potential met.

## 2024-03-24 NOTE — Telephone Encounter (Signed)
 Requested medications are due for refill today.  yes  Requested medications are on the active medications list.  yes  Last refill. 09/14/2023 #12 1 rf  Future visit scheduled.   no  Notes to clinic.  Labs are expired.    Requested Prescriptions  Pending Prescriptions Disp Refills   alendronate  (FOSAMAX ) 70 MG tablet [Pharmacy Med Name: ALENDRONATE  SODIUM 70 MG TAB] 12 tablet 1    Sig: Take 1 tablet (70 mg total) by mouth once a week. Take with a full glass of water  on an empty stomach.     Endocrinology:  Bisphosphonates Failed - 03/24/2024  3:20 PM      Failed - Ca in normal range and within 360 days    Calcium   Date Value Ref Range Status  10/23/2022 9.6 8.6 - 10.4 mg/dL Final   Calcium , Total  Date Value Ref Range Status  02/08/2015 9.2 mg/dL Final    Comment:    7.2-53.6 NOTE: New Reference Range  01/12/15          Failed - Vitamin D  in normal range and within 360 days    Vit D, 1,25-Dihydroxy  Date Value Ref Range Status  08/07/2019 71.4 19.9 - 79.3 pg/mL Final    Comment:    (NOTE) Performed At: [facility] [address] [place], [place] [id number] Pearlean Botts MD [id number]    25-Hydroxy, Vitamin D -3  Date Value Ref Range Status  07/27/2022 53 ng/mL Final    Comment:    (NOTE) This test was developed and its performance characteristics determined by [facility]. It has not been cleared or approved by the Food and Drug Administration. Performed At: [facility] [address] [place], [place] [id number] Alec American MD [id number]    25-Hydroxy, Vitamin D -2  Date Value Ref Range Status  07/27/2022 <1.0 ng/mL Final    Comment:    (NOTE) This test was developed and its performance characteristics determined by [facility]. It has not been cleared or approved by the Food and Drug Administration.    25-Hydroxy, Vitamin D   Date Value Ref Range Status  07/27/2022 53 ng/mL Final    Comment:    (NOTE) Reference Range: All  Ages: Target levels 30 - 100    Vit D, 25-Hydroxy  Date Value Ref Range Status  08/07/2019 57.46 30 - 100 ng/mL Final    Comment:    (NOTE) Vitamin D  deficiency has been defined by the Institute of Medicine  and an Endocrine Society practice guideline as a level of serum 25-OH  vitamin D  less than 20 ng/mL (1,2). The Endocrine Society went on to  further define vitamin D  insufficiency as a level between 21 and 29  ng/mL (2). 1. IOM (Institute of Medicine). 2010. Dietary reference intakes for  calcium  and D. [place]: [employer]. 2. Holick MF, Binkley Taft, Bischoff-Ferrari HA, et al. Evaluation,  treatment, and prevention of vitamin D  deficiency: an Endocrine  Society clinical practice guideline, JCEM. 2011 Jul; 96(7): 1911-30. Performed at [facility], [address], [place], [place] [address]          Failed - Cr in normal range and within 360 days    Creat  Date Value Ref Range Status  10/23/2022 0.56 (L) 0.60 - 1.00 mg/dL Final         Failed - Mg Level in normal range and within 360 days    Magnesium   Date Value Ref Range Status  07/27/2022 2.6 (  H) 1.7 - 2.4 mg/dL Final    Comment:    Performed at [facility], [address] [place], [place] [address]         Failed - Phosphate in normal range and within 360 days    Phosphorus  Date Value Ref Range Status  08/07/2019 3.1 2.5 - 4.6 mg/dL Final    Comment:    Performed at [facility], [address] [place], [place] [address]         Failed - eGFR is 30 or above and within 360 days    GFR, Est African American  Date Value Ref Range Status  06/28/2020 105 > OR = 60 mL/min/1.80m2 Final   GFR, Est Non African American  Date Value Ref Range Status  06/28/2020 90 > OR = 60 mL/min/1.58m2 Final   GFR, Estimated  Date Value Ref Range Status  07/27/2022 >60 >60 mL/min Final    Comment:    (NOTE) Calculated using the CKD-EPI Creatinine Equation (2021)    eGFR   Date Value Ref Range Status  10/23/2022 94 > OR = 60 mL/min/1.57m2 Final         Failed - Valid encounter within last 12 months    Recent Outpatient Visits           1 month ago Acute cystitis with hematuria   [facility], Kayleen Party, DO              Failed - Bone Mineral Density or Dexa Scan completed in the last 2 years

## 2024-03-25 ENCOUNTER — Other Ambulatory Visit: Payer: Self-pay | Admitting: Family Medicine

## 2024-03-25 DIAGNOSIS — J432 Centrilobular emphysema: Secondary | ICD-10-CM

## 2024-03-25 DIAGNOSIS — J441 Chronic obstructive pulmonary disease with (acute) exacerbation: Secondary | ICD-10-CM

## 2024-03-25 MED ORDER — ALBUTEROL SULFATE HFA 108 (90 BASE) MCG/ACT IN AERS: INHALATION_SPRAY | RESPIRATORY_TRACT | 1 refills | Status: AC

## 2024-03-25 NOTE — Telephone Encounter (Unsigned)
 Copied from CRM [id number]. Topic: Clinical - Medication Refill >> Mar 25, 2024  1:44 PM Santiya F wrote: Medication: albuterol  (VENTOLIN  HFA) 108 (90 Base) MCG/ACT inhaler [[id number]]  Has the patient contacted their pharmacy? Yes  (Agent: If yes, when and what did the pharmacy advise?) Contact office   This is the patient's preferred pharmacy:  [id number] - [place], [place] - [address] [place] [address] Phone: [phone] Fax: [phone]  Is this the correct pharmacy for this prescription? Yes If no, delete pharmacy and type the correct one.   Has the prescription been filled recently? No  Is the patient out of the medication? Yes  Has the patient been seen for an appointment in the last year OR does the patient have an upcoming appointment? Yes  Can we respond through MyChart? No  Agent: Please be advised that Rx refills may take up to 3 business days. We ask that you follow-up with your pharmacy.

## 2024-03-30 ENCOUNTER — Encounter: Payer: Self-pay | Admitting: Emergency Medicine

## 2024-03-30 ENCOUNTER — Ambulatory Visit (INDEPENDENT_AMBULATORY_CARE_PROVIDER_SITE_OTHER)

## 2024-03-30 ENCOUNTER — Ambulatory Visit
Admission: EM | Admit: 2024-03-30 | Discharge: 2024-03-30 | Disposition: A | Discharge status: Home / Self Care | Attending: Physician Assistant | Admitting: Physician Assistant

## 2024-03-30 DIAGNOSIS — Z96612 Presence of left artificial shoulder joint: Secondary | ICD-10-CM | POA: Diagnosis not present

## 2024-03-30 DIAGNOSIS — R051 Acute cough: Secondary | ICD-10-CM

## 2024-03-30 DIAGNOSIS — J441 Chronic obstructive pulmonary disease with (acute) exacerbation: Secondary | ICD-10-CM | POA: Insufficient documentation

## 2024-03-30 DIAGNOSIS — R0602 Shortness of breath: Secondary | ICD-10-CM | POA: Diagnosis not present

## 2024-03-30 DIAGNOSIS — J449 Chronic obstructive pulmonary disease, unspecified: Secondary | ICD-10-CM | POA: Diagnosis not present

## 2024-03-30 DIAGNOSIS — R918 Other nonspecific abnormal finding of lung field: Secondary | ICD-10-CM | POA: Diagnosis not present

## 2024-03-30 LAB — RESP PANEL BY RT-PCR (RSV, FLU A&B, COVID)  RVPGX2
Influenza A by PCR: NEGATIVE
Influenza B by PCR: NEGATIVE
Resp Syncytial Virus by PCR: NEGATIVE
SARS Coronavirus 2 by RT PCR: NEGATIVE

## 2024-03-30 MED ORDER — IPRATROPIUM-ALBUTEROL 0.5-2.5 (3) MG/3ML IN SOLN: 3.0000 mL | Freq: Four times a day (QID) | RESPIRATORY_TRACT | 1 refills | Status: AC | PRN

## 2024-03-30 MED ORDER — PREDNISONE 20 MG PO TABS: 40.0000 mg | ORAL_TABLET | Freq: Every day | ORAL | 0 refills | Status: AC

## 2024-03-30 MED ORDER — BENZONATATE 200 MG PO CAPS: 200.0000 mg | ORAL_CAPSULE | Freq: Three times a day (TID) | ORAL | 0 refills | Status: DC | PRN

## 2024-03-30 MED ORDER — IPRATROPIUM-ALBUTEROL 0.5-2.5 (3) MG/3ML IN SOLN
3.0000 mL | Freq: Once | RESPIRATORY_TRACT | Status: AC
  Administered 2024-03-30: 3 mL via RESPIRATORY_TRACT

## 2024-03-30 MED ORDER — PROMETHAZINE-DM 6.25-15 MG/5ML PO SYRP
5.0000 mL | ORAL_SOLUTION | Freq: Every evening | ORAL | 0 refills | Status: DC | PRN
Start: 2024-03-30 — End: 2024-04-15

## 2024-03-30 MED ORDER — DOXYCYCLINE HYCLATE 100 MG PO CAPS: 100.0000 mg | ORAL_CAPSULE | Freq: Two times a day (BID) | ORAL | 0 refills | Status: AC

## 2024-03-30 NOTE — ED Provider Notes (Signed)
 [facility]    CSN: [id number] Arrival date & time: 03/30/24  0955      History   Chief Complaint Chief Complaint  Patient presents with   Cough    HPI Pamela Ball is a 78 y.o. female with history of bipolar disorder, COPD, GERD, chronic pain, anxiety and depression.  She presents today for 2-day history of fatigue, cough, congestion, wheezing, shortness of breath and posttussive vomiting.  Denies fever, sore throat, nasal congestion, abdominal pain, diarrhea.  No sick contacts.  Patient presents with her sister-in-law today who is helping to provide some of the medical history.  Patient reports using inhaler at home but not taking any cough medicine.  HPI  Past Medical History:  Diagnosis Date   Abdominal hernia with obstruction and without gangrene    Anxiety 02/15/2017   denies   Asthma    Bipolar 1 disorder, depressed, full remission (HCC) 02/15/2017   Bipolar affective disorder (HCC)    COPD (chronic obstructive pulmonary disease) (HCC)    Depression    GERD (gastroesophageal reflux disease) 02/15/2017   Glaucoma    Headache    Hiatal hernia    History of abdominal hernia 05/21/2017   History of compression fracture of spine 02/15/2017   Hyperlipidemia    Hypertension    Incisional hernia    Incisional ventral hernia w obstruction 06/02/2017   Normocytic anemia 05/23/2017   Osteoarthritis of knees, 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   Presbycusis of both ears 02/15/2017    Patient Active Problem List   Diagnosis Date Noted   Uses wheelchair 09/06/2022   At high risk for falls 09/06/2022   Chronic knee pain (1ry area of Pain) (Left) 07/27/2022   Chronic shoulder pain (2ry area of Pain) (Left) 07/27/2022   Chronic lower extremity pain (3ry area of Pain) (Left) 07/27/2022   Chronic pain syndrome 07/26/2022   Pharmacologic therapy 07/26/2022   Disorder of skeletal system 07/26/2022   Problems influencing health status 07/26/2022   S/P reverse total shoulder  arthroplasty, 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   Recurrent falls 01/05/2022   Acute blood loss anemia 01/05/2022   Bipolar and related disorder (HCC) 01/05/2022   Erythrocytosis 01/05/2022   History of CVA (cerebrovascular accident) 01/05/2022   History of non-ST elevation myocardial infarction (NSTEMI) 01/05/2022   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   Osteoporosis 01/05/2022   Closed displaced comminuted fracture of shaft of left humerus 01/04/2022   Closed 4-part fracture of proximal end of left humerus with malunion 01/04/2022   Venous insufficiency of both lower extremities 12/13/2021   Blindness, 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   Left acetabular fracture (HCC) 05/04/2021   Fall 05/04/2021   Essential hypertension 06/28/2020   Malnutrition of moderate degree 12/27/2018   COPD (chronic obstructive pulmonary disease) (HCC) 12/26/2018   Allergic rhinitis due to allergen 05/14/2018   Normocytic anemia 05/23/2017   Bipolar 1 disorder, depressed, full remission (HCC) 02/15/2017   GERD (gastroesophageal reflux disease) 02/15/2017   Osteoarthritis of knees, 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   Hyperlipidemia 02/15/2017   Anxiety 02/15/2017    Past Surgical History:  Procedure Laterality Date   ABDOMINAL HYSTERECTOMY  11/06/2006   APPENDECTOMY     BREAST BIOPSY Right 12/14/2020   stereo bx of distortion/asymmetry, coil marker, path pending   BREAST EXCISIONAL BIOPSY Left    age 50- benign    CESAREAN SECTION     x3   COLONOSCOPY WITH PROPOFOL  N/A 06/25/2018   Procedure: COLONOSCOPY WITH PROPOFOL ;  Surgeon: Luke Salaam, MD;  Location: [facility];  Service: Endoscopy;  Laterality: N/A;   KYPHOPLASTY     KYPHOPLASTY N/A 09/23/2018   Procedure: KYPHOPLASTY T10;  Surgeon: Molli Angelucci, MD;  Location: [facility];  Service: Orthopedics;  Laterality: N/A;   KYPHOPLASTY N/A 03/11/2020   Procedure: L3 KYPHOPLASTY;  Surgeon: Molli Angelucci, MD;  Location: [facility];  Service: Orthopedics;  Laterality: N/A;    KYPHOPLASTY N/A 12/30/2020   Procedure: L5 KYPHOPLASTY;  Surgeon: Molli Angelucci, MD;  Location: [facility];  Service: Orthopedics;  Laterality: N/A;   ORIF FEMUR FRACTURE Left 06/23/2021   Procedure: OPEN REDUCTION INTERNAL FIXATION (ORIF) DISTAL FEMUR FRACTURE;  Surgeon: Molli Angelucci, MD;  Location: [facility];  Service: Orthopedics;  Laterality: Left;   ORIF HUMERUS FRACTURE Left 08/07/2019   Procedure: OPEN REDUCTION INTERNAL FIXATION (ORIF) LEFT DISTAL HUMERUS FRACTURE;  Surgeon: Hardy Lia, MD;  Location: [facility];  Service: Orthopedics;  Laterality: Left;   ORIF HUMERUS FRACTURE Left 12/23/2021   Procedure: OPEN REDUCTION INTERNAL FIXATION (ORIF) HUMERAL SHAFT FRACTURE;  Surgeon: Lorri Rota, MD;  Location: [facility];  Service: Orthopedics;  Laterality: Left;   REVERSE SHOULDER ARTHROPLASTY Left 12/23/2021   Procedure: ATTEMPTED Left reverse shoulder arthroplasty;  Surgeon: Lorri Rota, MD;  Location: [facility];  Service: Orthopedics;  Laterality: Left;   VENTRAL HERNIA REPAIR N/A 06/02/2017   Procedure: exploratory laparotomy repair of incarcerated  VENTRAL hernia;  Surgeon: Claudia Cuff, MD;  Location: [facility];  Service: General;  Laterality: N/A;   VENTRAL HERNIA REPAIR N/A 08/29/2018   Procedure: LAPAROSCOPIC INCISIONAL HERNIA;  Surgeon: Emmalene Hare, MD;  Location: [facility];  Service: General;  Laterality: N/A;    OB History     Gravida  3   Para  3   Term      Preterm      AB      Living         SAB      IAB      Ectopic      Multiple      Live Births               Home Medications    Prior to Admission medications   Medication Sig Start Date End Date Taking? Authorizing Provider  benzonatate (TESSALON) 200 MG capsule Take 1 capsule (200 mg total) by mouth 3 (three) times daily as needed. 03/30/24  Yes Floydene Hy, PA-C  doxycycline  (VIBRAMYCIN ) 100 MG capsule Take 1 capsule (100 mg total) by mouth 2 (two) times daily for 7 days. 03/30/24 04/06/24  Yes Floydene Hy, PA-C  ipratropium-albuterol  (DUONEB) 0.5-2.5 (3) MG/3ML SOLN Take 3 mLs by nebulization every 6 (six) hours as needed. 03/30/24  Yes Floydene Hy, PA-C  predniSONE (DELTASONE) 20 MG tablet Take 2 tablets (40 mg total) by mouth daily for 5 days. 03/30/24 04/04/24 Yes Floydene Hy, PA-C  promethazine -dextromethorphan  (PROMETHAZINE -DM) 6.25-15 MG/5ML syrup Take 5 mLs by mouth at bedtime as needed for cough. 03/30/24  Yes Floydene Hy, PA-C  albuterol  (VENTOLIN  HFA) 108 (90 Base) MCG/ACT inhaler INHALE 2 PUFFS BY MOUTH EVERY 4 HOURS AS NEEDED FOR WHEEZING OR SHORTNESS OF BREATH (COUGH). 03/25/24   Karamalegos, Kayleen Party, DO  alendronate  (FOSAMAX ) 70 MG tablet TAKE 1 TABLET (70 MG TOTAL) BY MOUTH ONCE A WEEK. TAKE WITH A FULL GLASS OF WATER  ON AN EMPTY STOMACH. 03/24/24   Romeo Co, Kayleen Party, DO  amLODipine  (NORVASC ) 10 MG tablet TAKE 1 TABLET BY MOUTH EVERY DAY 11/20/23  Karamalegos, Kayleen Party, DO  Ascorbic Acid  (VITAMIN C  PO) Take 1 tablet by mouth daily.    [provider]  Aspirin -Caffeine (BC FAST PAIN RELIEF PO) Take 1 packet by mouth 4 (four) times daily as needed (pain). Patient not taking: Reported on 10/25/2023    [provider]  buPROPion  (WELLBUTRIN  XL) 150 MG 24 hr tablet Take 1 tablet (150 mg total) by mouth daily. 12/03/23   Karamalegos, Kayleen Party, DO  Calcium -Vitamin D -Vitamin K ([facility] CALCIUM  SOFT CHEWS PO) Take 1 tablet by mouth daily. Patient not taking: Reported on 02/22/2024    [provider]  Cholecalciferol  (VITAMIN D3) 1000 units CAPS Take 1 capsule by mouth daily.    [provider]  docusate sodium  (COLACE) 100 MG capsule Take 1 capsule (100 mg total) by mouth 2 (two) times daily. Patient taking differently: Take 100 mg by mouth daily. 06/24/21   Coralyn Derry, PA-C  feeding supplement (ENSURE ENLIVE / ENSURE PLUS) LIQD Take 237 mLs by mouth 3 (three) times daily between meals. 06/27/21   Garnet Just, MD   Multiple Vitamin (MULTIVITAMIN WITH MINERALS) TABS tablet Take 1 tablet by mouth daily.    [provider]  Multiple Vitamins-Minerals (PRESERVISION AREDS PO) Take 1 tablet by mouth daily.    [provider]  OLANZapine  (ZYPREXA ) 7.5 MG tablet Take 7.5 mg by mouth daily. 12/24/19   [provider]  omeprazole  (PRILOSEC) 40 MG capsule Take 1 capsule (40 mg total) by mouth daily before breakfast. 02/22/24   Romeo Co, Kayleen Party, DO  simvastatin  (ZOCOR ) 20 MG tablet Take 1 tablet (20 mg total) by mouth daily. 12/03/23   Karamalegos, Kayleen Party, DO  venlafaxine  XR (EFFEXOR -XR) 75 MG 24 hr capsule Take 1 capsule (75 mg total) by mouth daily with breakfast. 12/03/23   Romeo Co, Kayleen Party, DO  vitamin B-12 1000 MCG tablet Take 3 tablets (3,000 mcg total) by mouth daily. Patient taking differently: Take 1,000 mcg by mouth daily. 06/28/21   Garnet Just, MD    Family History Family History  Problem Relation Age of Onset   Cancer Mother        cancer   Breast cancer Mother 50   Hypertension Father     Social History Social History   Tobacco Use   Smoking status: Never   Smokeless tobacco: Never  Vaping Use   Vaping status: Never Used  Substance Use Topics   Alcohol use: No    Comment: drink occasionally    Drug use: No     Allergies   Tape   Review of Systems Review of Systems  Constitutional:  Positive for fatigue. Negative for chills, diaphoresis and fever.  HENT:  Positive for congestion. Negative for ear pain, rhinorrhea, sinus pressure, sinus pain and sore throat.   Respiratory:  Positive for cough, shortness of breath and wheezing.   Cardiovascular:  Negative for chest pain.  Gastrointestinal:  Positive for vomiting. Negative for abdominal pain, diarrhea and nausea.  Skin:  Negative for rash.  Neurological:  Negative for weakness and headaches.  Hematological:  Negative for adenopathy.     Physical Exam Triage Vital Signs ED Triage  Vitals  Encounter Vitals Group     BP      Systolic BP Percentile      Diastolic BP Percentile      Pulse      Resp      Temp      Temp src      SpO2  Weight      Height      Head Circumference      Peak Flow      Pain Score      Pain Loc      Pain Education      Exclude from Growth Chart    No data found.  Updated Vital Signs BP 123/70 (BP Location: Right Arm)   Pulse 97   Temp 98.5 F (36.9 C) (Oral)   Resp 14   Ht 4\' 10"  (1.473 m)   Wt 84 lb 7 oz (38.3 kg)   SpO2 93%   BMI 17.65 kg/m       Physical Exam Vitals and nursing note reviewed.  Constitutional:      General: She is not in acute distress.    Appearance: Normal appearance. She is ill-appearing (chronically ill appearing and very thin). She is not toxic-appearing.  HENT:     Head: Normocephalic and atraumatic.     Nose: No congestion.     Mouth/Throat:     Mouth: Mucous membranes are moist.     Pharynx: Oropharynx is clear.  Eyes:     General: No scleral icterus.       Right eye: No discharge.        Left eye: No discharge.     Conjunctiva/sclera: Conjunctivae normal.  Cardiovascular:     Rate and Rhythm: Normal rate and regular rhythm.     Heart sounds: Normal heart sounds.  Pulmonary:     Effort: Pulmonary effort is normal. No respiratory distress.     Breath sounds: Wheezing and rhonchi present.  Musculoskeletal:     Cervical back: Neck supple.  Skin:    General: Skin is dry.  Neurological:     General: No focal deficit present.     Mental Status: She is alert. Mental status is at baseline.     Motor: No weakness.     Gait: Gait abnormal (in wheelchair).  Psychiatric:        Mood and Affect: Mood normal.        Behavior: Behavior normal.      UC Treatments / Results  Labs (all labs ordered are listed, but only abnormal results are displayed) Labs Reviewed  RESP PANEL BY RT-PCR (RSV, FLU A&B, COVID)  [id number]    EKG   Radiology DG Chest 2 View Result Date:  03/30/2024 EXAM: 2 VIEW(S) XRAY OF THE CHEST 03/30/2024 10:48:00 AM COMPARISON: 2-view chest x-ray 10/20/2023. CLINICAL HISTORY: Cough, congestion, shortness of breath for 2 days. History of COPD. Patient complains of cough, chest congestion, nausea and vomiting that started on Friday. FINDINGS: LUNGS AND PLEURA: Emphysematous changes are present. No focal pulmonary opacity. No pulmonary edema. No pleural effusion. No pneumothorax. HEART AND MEDIASTINUM: Atherosclerotic changes are again noted. No acute abnormality of the cardiac and mediastinal silhouettes. BONES AND SOFT TISSUES: Spinal augmentation is present at multiple levels in the thoracic and lumbar spine. No acute fractures are present. Left shoulder reverse arthroplasty noted. No acute osseous abnormality. IMPRESSION: 1. No acute findings. 2. Emphysematous changes, consistent with history of COPD. Electronically signed by: Audree Leas MD 03/30/2024 11:07 AM EDT RP Workstation: [id number]    Procedures Procedures (including critical care time)  Medications Ordered in UC Medications  ipratropium-albuterol  (DUONEB) 0.5-2.5 (3) MG/3ML nebulizer solution 3 mL (3 mLs Nebulization Given 03/30/24 1059)    Initial Impression / Assessment and Plan / UC Course  I have reviewed the triage vital signs and the  nursing notes.  Pertinent labs & imaging results that were available during my care of the patient were reviewed by me and considered in my medical decision making (see chart for details).   78 year old female with history of COPD presents for 2-day history of fatigue, cough, congestion, wheezing, shortness of breath and posttussive vomiting.  No associated fever.  Has been using inhaler at home.  Patient is in no acute distress.  Speaking in full and clear sentences.  Oxygen  91%.  On exam she is chronically ill-appearing but nontoxic.  Very thin female in wheelchair.  Has diffuse wheezing and rhonchi throughout all lung  fields.  Respiratory panel obtained by nursing staff is all negative.  Chest x-ray obtained to evaluate for possible pneumonia.  Patient given DuoNeb treatment.  Oxygen  up to 93%.  Patient reports it helped her symptoms.  Chest x-ray without any acute abnormalities.  Consistent with underlying emphysema.  Reviewed this with patient and family member.  Treating for COPD exacerbation at this time with doxycycline , prednisone, benzonatate, and promethazine  DM at bedtime.  Patient was given nebulizer machine and I sent DuoNeb solution to pharmacy.  Discussed the importance of going to emergency department if breathing is not improving, she develops a fever, or has increased weakness.  Patient and caregiver are understanding and agreeable.   Final Clinical Impressions(s) / UC Diagnoses   Final diagnoses:  Acute cough  COPD exacerbation (HCC)  Shortness of breath     Discharge Instructions      - Chest x-ray does not show any evidence of pneumonia and you are negative for COVID, flu and RSV. - COPD is flared up. -I have sent antibiotics, corticosteroids, and cough meds to pharmacy. - I also sent solution for nebulizer.  Try to do the nebulizer a few times per day to help with breathing. - If fever or worsening breathing please go to the emergency department.   ED Prescriptions     Medication Sig Dispense Auth. Provider   benzonatate (TESSALON) 200 MG capsule Take 1 capsule (200 mg total) by mouth 3 (three) times daily as needed. 30 capsule Nancy Axon B, PA-C   doxycycline  (VIBRAMYCIN ) 100 MG capsule Take 1 capsule (100 mg total) by mouth 2 (two) times daily for 7 days. 14 capsule Nancy Axon B, PA-C   predniSONE (DELTASONE) 20 MG tablet Take 2 tablets (40 mg total) by mouth daily for 5 days. 10 tablet Nancy Axon B, PA-C   promethazine -dextromethorphan  (PROMETHAZINE -DM) 6.25-15 MG/5ML syrup Take 5 mLs by mouth at bedtime as needed for cough. 118 mL Nancy Axon B, PA-C    ipratropium-albuterol  (DUONEB) 0.5-2.5 (3) MG/3ML SOLN Take 3 mLs by nebulization every 6 (six) hours as needed. 360 mL Floydene Hy, PA-C      PDMP not reviewed this encounter.   Floydene Hy, PA-C 03/30/24 1135

## 2024-03-30 NOTE — Discharge Instructions (Addendum)
-   Chest x-ray does not show any evidence of pneumonia and you are negative for COVID, flu and RSV. - COPD is flared up. -I have sent antibiotics, corticosteroids, and cough meds to pharmacy. - I also sent solution for nebulizer.  Try to do the nebulizer a few times per day to help with breathing. - If fever or worsening breathing please go to the emergency department.

## 2024-03-30 NOTE — ED Triage Notes (Signed)
 Patient c/o cough, chest congestion, nausea and vomiting that started on Friday.  Patient unsure of fevers.

## 2024-04-14 ENCOUNTER — Ambulatory Visit: Payer: Self-pay

## 2024-04-14 ENCOUNTER — Other Ambulatory Visit: Payer: Self-pay | Admitting: Family Medicine

## 2024-04-14 DIAGNOSIS — M810 Age-related osteoporosis without current pathological fracture: Secondary | ICD-10-CM

## 2024-04-14 NOTE — Telephone Encounter (Signed)
 Reason for Disposition . MODERATE-SEVERE itching (i.e., interferes with school, work, or sleep)  Answer Assessment - Initial Assessment Questions 1. SYMPTOM: "What's the main symptom you're concerned about?" (e.g., pain, itching, dryness)     Burning and itchy 2. LOCATION: "Where is the  itching, burning located?" (e.g., inside/outside, left/right)     external 3. ONSET: "When did the  itching/burning  start?"     Last week 4. PAIN: "Is there any pain?" If Yes, ask: "How bad is it?" (Scale: 1-10; mild, moderate, severe)   -  MILD (1-3): Doesn't interfere with normal activities.    -  MODERATE (4-7): Interferes with normal activities (e.g., work or school) or awakens from sleep.     -  SEVERE (8-10): Excruciating pain, unable to do any normal activities.     Mild 5. ITCHING: "Is there any itching?" If Yes, ask: "How bad is it?" (Scale: 1-10; mild, moderate, severe)     severe 6. CAUSE: "What do you think is causing the discharge?" "Have you had the same problem before? What happened then?"     unsure 7. OTHER SYMPTOMS: "Do you have any other symptoms?" (e.g., fever, itching, vaginal bleeding, pain with urination, injury to genital area, vaginal foreign body)     One time today felt urge to urinate but bladder was empty.  Protocols used: Vaginal Symptoms-A-AH

## 2024-04-14 NOTE — Telephone Encounter (Addendum)
 FYI Only or Action Required?: FYI only for provider  Patient was last seen in primary care on 02/22/2024 by Raina Bunting, DO. Called Nurse Triage reporting vaginal symptoms. Symptoms began a week ago. Interventions attempted: Rest, hydration, or home remedies. Symptoms are: gradually worsening.  Triage Disposition: See Physician Within 24 Hours  Patient/caregiver understands and will follow disposition?: Yes  1st attempt recording states the number is not in service.   Copied from CRM [id number]. Topic: Clinical - Pink Word Triage >> Apr 14, 2024  3:51 PM Turkey B wrote: Pt has itching and burning in her vagina, also bumps

## 2024-04-14 NOTE — Telephone Encounter (Unsigned)
 Copied from CRM [id number]. Topic: Clinical - Medication Refill >> Apr 14, 2024  4:00 PM Turkey B wrote: Medication: alendronate  (FOSAMAX ) 70 MG tablet   Has the patient contacted their pharmacy? no Pt requested this while I was on the phone with her for another request  This is the patient's preferred pharmacy:  [id number] - [place], [place] - [address] [place] [address] Phone: [phone] Fax: [phone]    Is this the correct pharmacy for this prescription? yes If no, delete pharmacy and type the correct one.   Has the prescription been filled recently? no  Is the patient out of the medication? yes  Has the patient been seen for an appointment in the last year OR does the patient have an upcoming appointment? yes  Can we respond through MyChart? no  Agent: Please be advised that Rx refills may take up to 3 business days. We ask that you follow-up with your pharmacy.

## 2024-04-15 ENCOUNTER — Other Ambulatory Visit (HOSPITAL_COMMUNITY)
Admission: RE | Admit: 2024-04-15 | Discharge: 2024-04-15 | Disposition: A | Discharge status: Home / Self Care | Source: Ambulatory Visit | Attending: Internal Medicine | Admitting: Internal Medicine

## 2024-04-15 ENCOUNTER — Ambulatory Visit (INDEPENDENT_AMBULATORY_CARE_PROVIDER_SITE_OTHER): Admitting: Internal Medicine

## 2024-04-15 ENCOUNTER — Encounter: Payer: Self-pay | Admitting: Internal Medicine

## 2024-04-15 VITALS — BP 110/62 | Ht <= 58 in | Wt 80.8 lb

## 2024-04-15 DIAGNOSIS — N898 Other specified noninflammatory disorders of vagina: Secondary | ICD-10-CM | POA: Insufficient documentation

## 2024-04-15 DIAGNOSIS — B029 Zoster without complications: Secondary | ICD-10-CM

## 2024-04-15 DIAGNOSIS — R300 Dysuria: Secondary | ICD-10-CM

## 2024-04-15 DIAGNOSIS — R3911 Hesitancy of micturition: Secondary | ICD-10-CM | POA: Diagnosis not present

## 2024-04-15 DIAGNOSIS — R3915 Urgency of urination: Secondary | ICD-10-CM | POA: Diagnosis not present

## 2024-04-15 LAB — POCT URINE DIPSTICK
Glucose, UA: NEGATIVE mg/dL
Nitrite, UA: NEGATIVE
Spec Grav, UA: 1.015 (ref 1.010–1.025)
Urobilinogen, UA: 0.2 U/dL
pH, UA: 6.5 (ref 5.0–8.0)

## 2024-04-15 MED ORDER — VALACYCLOVIR HCL 1 G PO TABS: 1000.0000 mg | ORAL_TABLET | Freq: Three times a day (TID) | ORAL | 0 refills | Status: AC

## 2024-04-15 NOTE — Progress Notes (Addendum)
 Subjective:    Patient ID: Pamela Ball, female    DOB: 1946/10/01, 78 y.o.   MRN: [id number]  HPI  Discussed the use of AI scribe software for clinical note transcription with the patient, who gave verbal consent to proceed.  Pamela Ball is a 78 year old female presents with a rash in her suprapubic area.  She has been experiencing a rash for the past four to five days, with symptoms of itching and burning. There is increased urgency to urinate, sometimes without being able to void.  She has had some burning with urination.  She denies frequency, dysuria, blood in her urine or bladder pressure.  No vaginal discharge is present, but there is a little vaginal itching.  She denies fever, chills, nausea, vomiting or increased low back pain.  She has not tried anything OTC for her symptoms.   Review of Systems   Past Medical History:  Diagnosis Date   Abdominal hernia with obstruction and without gangrene    Anxiety 02/15/2017   denies   Asthma    Bipolar 1 disorder, depressed, full remission (HCC) 02/15/2017   Bipolar affective disorder (HCC)    COPD (chronic obstructive pulmonary disease) (HCC)    Depression    GERD (gastroesophageal reflux disease) 02/15/2017   Glaucoma    Headache    Hiatal hernia    History of abdominal hernia 05/21/2017   History of compression fracture of spine 02/15/2017   Hyperlipidemia    Hypertension    Incisional hernia    Incisional ventral hernia w obstruction 06/02/2017   Normocytic anemia 05/23/2017   Osteoarthritis of knees, 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 6.7 each 1   alendronate  (FOSAMAX ) 70 MG tablet TAKE 1 TABLET (70 MG TOTAL) BY MOUTH ONCE A WEEK. TAKE WITH A FULL GLASS OF WATER  ON AN EMPTY  STOMACH. 12 tablet 1   amLODipine  (NORVASC ) 10 MG tablet TAKE 1 TABLET BY MOUTH EVERY DAY 90 tablet 1   Ascorbic Acid  (VITAMIN C  PO) Take 1 tablet by mouth daily.     Aspirin -Caffeine (BC FAST PAIN RELIEF PO) Take 1 packet by mouth 4 (four) times daily as needed (pain). (Patient not taking: Reported on 10/25/2023)     benzonatate  (TESSALON ) 200 MG capsule Take 1 capsule (200 mg total) by mouth 3 (three) times daily as needed. 30 capsule 0   buPROPion  (WELLBUTRIN  XL) 150 MG 24 hr tablet Take 1 tablet (150 mg total) by mouth daily. 90 tablet 1   Calcium -Vitamin D -Vitamin K ([employer] CALCIUM  SOFT CHEWS PO) Take 1 tablet by mouth daily. (Patient not taking: Reported on 02/22/2024)     Cholecalciferol  (VITAMIN D3) 1000 units CAPS Take 1 capsule by mouth daily.     docusate sodium  (COLACE) 100 MG capsule Take 1 capsule (100 mg total) by mouth 2 (two) times daily. (Patient taking differently: Take 100 mg by mouth daily.) 10 capsule 0   feeding supplement (ENSURE ENLIVE / ENSURE PLUS) LIQD Take 237 mLs by mouth 3 (three) times daily between meals. 237 mL 12   ipratropium-albuterol  (DUONEB) 0.5-2.5 (3) MG/3ML SOLN Take 3 mLs by nebulization every 6 (six) hours as needed. 360 mL 1   Multiple Vitamin (MULTIVITAMIN WITH MINERALS) TABS tablet Take 1 tablet  by mouth daily.     Multiple Vitamins-Minerals (PRESERVISION AREDS PO) Take 1 tablet by mouth daily.     OLANZapine  (ZYPREXA ) 7.5 MG tablet Take 7.5 mg by mouth daily.     omeprazole  (PRILOSEC) 40 MG capsule Take 1 capsule (40 mg total) by mouth daily before breakfast. 90 capsule 1   promethazine -dextromethorphan  (PROMETHAZINE -DM) 6.25-15 MG/5ML syrup Take 5 mLs by mouth at bedtime as needed for cough. 118 mL 0   simvastatin  (ZOCOR ) 20 MG tablet Take 1 tablet (20 mg total) by mouth daily. 90 tablet 3   venlafaxine  XR (EFFEXOR -XR) 75 MG 24 hr capsule Take 1 capsule (75 mg total) by mouth daily with breakfast. 90 capsule 1   vitamin B-12 1000 MCG tablet Take 3  tablets (3,000 mcg total) by mouth daily. (Patient taking differently: Take 1,000 mcg by mouth daily.)     No current facility-administered medications for this visit.    Allergies  Allergen Reactions   Tape Other (See Comments)    Pulls skin off    Family History  Problem Relation Age of Onset   Cancer Mother        cancer   Breast cancer Mother 82   Hypertension Father     Social History   Socioeconomic History   Marital status: Married    Spouse name: Jesenya Bowditch   Number of children: Not on file   Years of education: High School   Highest education level: Not on file  Occupational History   Occupation: retired  Tobacco Use   Smoking status: Never   Smokeless tobacco: Never  Vaping Use   Vaping status: Never Used  Substance and Sexual Activity   Alcohol use: No    Comment: drink occasionally    Drug use: No   Sexual activity: Not Currently  Other Topics Concern   Not on file  Social History Narrative   Not on file   Social Drivers of Health   Financial Resource Strain: Low Risk  (09/19/2023)   Received from [facility]   Overall Financial Resource Strain (CARDIA)    Difficulty of Paying Living Expenses: Not hard at all  Food Insecurity: No Food Insecurity (09/19/2023)   Received [facility]   Hunger Vital Sign    Worried About Running Out of Food in the Last Year: Never true    Ran Out of Food in the Last Year: Never true  Transportation Needs: No Transportation Needs (09/19/2023)   Received from [facility]   PRAPARE - Transportation    Lack of Transportation (Medical): No    Lack of Transportation (Non-Medical): No  Physical Activity: Insufficiently Active (02/16/2022)   Exercise Vital Sign    Days of Exercise per Week: 7 days    Minutes of Exercise per Session: 20 min  Stress: No Stress Concern Present (02/09/2022)   [employer] of Occupational Health - Occupational Stress Questionnaire    Feeling of Stress : Not at all   Social Connections: Unknown (01/03/2023)   Received from [facility], [facility]   Social Network    Social Network: Not on file  Intimate Partner Violence: Unknown (01/03/2023)   [facility] from [facility], [facility]   HITS    Physically Hurt: Not on file    Insult or Talk Down To: Not on file    Threaten Physical Harm: Not on file    Scream or Curse: Not on file     Constitutional: Denies fever, malaise, fatigue,  headache or abrupt weight changes.  Respiratory: Denies difficulty breathing, shortness of breath, cough or sputum production.   Cardiovascular: Denies chest pain, chest tightness, palpitations or swelling in the hands or feet.  Gastrointestinal: Denies abdominal pain.  GU: Pt reports vaginal itching. Denies urgency, frequency, pain with urination, burning sensation, blood in urine, odor or discharge. Skin: Pt reports rash in suprapubic area.  Denies ulcerations.    No other specific complaints in a complete review of systems (except as listed in HPI above).      Objective:   Physical Exam  BP 110/62 (BP Location: Right Arm, Patient Position: Sitting, Cuff Size: Normal)   Ht 4\' 10"  (1.473 m)   Wt 80 lb 12.8 oz (36.7 kg)   BMI 16.89 kg/m   Wt Readings from Last 3 Encounters:  03/30/24 84 lb 7 oz (38.3 kg)  02/22/24 84 lb 8 oz (38.3 kg)  12/03/23 90 lb (40.8 kg)    General: Appears her stated age, chronically ill-appearing, in NAD. Skin: Grouped vesicular lesions on erythematous base noted in the left suprapubic region. Cardiovascular: Bradycardic. Pulmonary/Chest: Normal effort and positive vesicular breath sounds. No respiratory distress.  Abdomen: Soft and nontender over the bladder.  No CVA tenderness noted. Pelvic: Normal female anatomy. Musculoskeletal: In wheelchair. Neurological: Alert and oriented.   BMET    Component Value Date/Time   NA 140 10/23/2022 0954   NA 138 02/08/2015 1724   K 4.6 10/23/2022 0954   K 3.7 02/08/2015 1724    CL 99 10/23/2022 0954   CL 101 02/08/2015 1724   CO2 33 (H) 10/23/2022 0954   CO2 28 02/08/2015 1724   GLUCOSE 118 (H) 10/23/2022 0954   GLUCOSE 113 (H) 02/08/2015 1724   BUN 12 10/23/2022 0954   BUN 9 02/08/2015 1724   CREATININE 0.56 (L) 10/23/2022 0954   CALCIUM  9.6 10/23/2022 0954   CALCIUM  9.2 02/08/2015 1724   GFRNONAA >60 07/27/2022 1305   GFRNONAA 90 06/28/2020 0927   GFRAA 105 06/28/2020 0927    Lipid Panel     Component Value Date/Time   CHOL 215 (H) 06/28/2020 0927   TRIG 125 06/28/2020 0927   HDL 77 06/28/2020 0927   CHOLHDL 2.8 06/28/2020 0927   VLDL 14 09/26/2018 1249   LDLCALC 114 (H) 06/28/2020 0927    CBC    Component Value Date/Time   WBC 5.9 10/23/2022 0954   RBC 4.52 10/23/2022 0954   HGB 14.9 10/23/2022 0954   HGB 14.1 02/08/2015 1724   HCT 43.6 10/23/2022 0954   HCT 42.3 02/08/2015 1724   PLT 235 10/23/2022 0954   PLT 387 02/08/2015 1724   MCV 96.5 10/23/2022 0954   MCV 98 02/08/2015 1724   MCH 33.0 10/23/2022 0954   MCHC 34.2 10/23/2022 0954   RDW 11.9 10/23/2022 0954   RDW 15.1 (H) 02/08/2015 1724   LYMPHSABS 1,333 10/23/2022 0954   LYMPHSABS 0.8 (L) 02/08/2015 1724   MONOABS 0.5 12/05/2021 1041   MONOABS 0.9 02/08/2015 1724   EOSABS 53 10/23/2022 0954   EOSABS 0.1 02/08/2015 1724   BASOSABS 30 10/23/2022 0954   BASOSABS 0.1 02/08/2015 1724    Hgb A1C Lab Results  Component Value Date   HGBA1C 5.5 10/23/2022            Assessment & Plan:   Assessment and Plan    Shingles Rash and symptoms consistent with shingles. No treatment for vaginal symptoms unless swab positive for bacterial vaginosis or yeast infection. - Prescribed  Valtrex  1 gram TID for 7 days.  Urinary urgency, hesitancy and burning with urination Urinary urgency and urethral burning possibly related to shingles or separate issue. No urine sample obtained. - Provided urine sample cup for home collection. Instructed to refrigerate sample until return to  clinic.   - Urinalysis shows trace blood and trace leuks - Will send urine culture for further evaluation of UTI   Followup with your PCP as previously scheduled Helayne Lo, NP

## 2024-04-15 NOTE — Telephone Encounter (Signed)
Will discuss at upcoming appointment today 

## 2024-04-15 NOTE — Patient Instructions (Signed)
 Shingles  Shingles, or herpes zoster, is an infection. It gives you a skin rash and blisters. These infected areas may hurt a lot. Shingles only happens if: You've had chickenpox. You've been given a shot called a vaccine to protect you from getting chickenpox. Shingles is rare in this case. What are the causes? Shingles is caused by a germ called the varicella-zoster virus. This is the same germ that causes chickenpox. After you're exposed to the germ, it stays in your body but is dormant. This means it isn't active. Shingles happens if the germ becomes active again. This can happen years after you're first exposed to the germ. What increases the risk? You may be more likely to get shingles if: You're older than 79 years of age. You're under a lot of stress. You have a weak immune system. The immune system is your body's defense system. It may be weak if: You have human immunodeficiency virus (HIV). You have acquired immunodeficiency syndrome (AIDS). You have cancer. You take medicines that weaken your immune system. These include organ transplant medicines. What are the signs or symptoms? The first symptoms of shingles may be itching, tingling, or pain. Your skin may feel like it's burning. A few days or weeks later, you'll get a rash. Here's what you can expect: The rash is likely to be on one side of your body. The rash may be shaped like a belt or a band. Over time, it will turn into blisters filled with fluid. The blisters will break open and change into scabs. The scabs will dry up in about 2-3 weeks. You may also have: A fever. Chills. A headache. Nausea. How is this diagnosed? Shingles is diagnosed with a skin exam. A sample called a culture may be taken from one of your blisters and sent to a lab. This will show if you have shingles. How is this treated? The rash may last for several weeks. There's no cure for shingles, but your health care provider may give you medicines.  These medicines may: Help with pain. Help with itching. Help with irritation and swelling. Help you get better sooner. Help to prevent long-term problems. If the rash is on your face, you may need to see an eye doctor or an ear, nose, and throat (ENT) doctor. Follow these instructions at home: Medicines Take your medicines only as told by your provider. Put an anti-itch cream or numbing cream on the rash or blisters as told by your provider. Relieving itching and discomfort  To help with itching: Put cold, wet cloths called cold compresses on the rash or blisters. Take a cool bath. Try adding baking soda or dry oatmeal to the water. Do not bathe in hot water. Use calamine lotion on the rash or blisters. You can get this type of lotion at the store. Blister and rash care Keep your rash covered with a loose bandage. Wear loose clothes that don't rub on your rash. Take care of your rash as told by your provider. Make sure you: Wash your hands with soap and water for at least 20 seconds before and after you change your bandage. If you can't use soap and water, use hand sanitizer. Keep your rash and blisters clean by washing them with mild soap and cool water. Change your bandage. Check your rash every day for signs of infection. Check for: More redness, swelling, or pain. Fluid or blood. Warmth. Pus or a bad smell. Do not scratch your rash. Do not pick at your  blisters. To help you not scratch: Keep your fingernails clean and cut short. Try to wear gloves or mittens when you sleep. General instructions Rest. Wash your hands often with soap and water for at least 20 seconds. If you can't use soap and water, use hand sanitizer. Washing your hands lowers your chance of getting a skin infection. Your infection can cause chickenpox in others. If you have blisters that aren't scabs yet, stay away from: Babies. Pregnant people. Children who have eczema. Older people who have organ  transplants. People who have a long-term, or chronic, illness. Anyone who hasn't had chickenpox before. Anyone who hasn't gotten the chickenpox vaccine. How is this prevented? Vaccines are the best way to prevent you from getting chickenpox or shingles. Talk with your provider about getting these shots. Where to find more information Centers for Disease Control and Prevention (CDC): TonerProm[employer].no Contact a health care provider if: Your pain doesn't get better with medicine. Your pain doesn't get better after the rash heals. You have any signs of infection around the rash. Your rash or blisters get worse. You have a fever or chills. Get help right away if: The rash is on your face or nose. You have pain in your face or by your eye. You lose feeling on one side of your face. You have trouble seeing. You have ear pain or ringing in your ear. This information is not intended to replace advice given to you by your health care provider. Make sure you discuss any questions you have with your health care provider. Document Revised: 07/26/2023 Document Reviewed: 12/08/2022 Elsevier Patient Education  2024 [employer].

## 2024-04-15 NOTE — Telephone Encounter (Signed)
 Too soon for refill.  Requested Prescriptions  Pending Prescriptions Disp Refills   alendronate  (FOSAMAX ) 70 MG tablet 12 tablet 1    Sig: Take 1 tablet (70 mg total) by mouth once a week. Take with a full glass of water  on an empty stomach.     Endocrinology:  Bisphosphonates Failed - 04/15/2024  3:36 PM      Failed - Ca in normal range and within 360 days    Calcium   Date Value Ref Range Status  10/23/2022 9.6 8.6 - 10.4 mg/dL Final   Calcium , Total  Date Value Ref Range Status  02/08/2015 9.2 mg/dL Final    Comment:    1.6-10.9 NOTE: New Reference Range  01/12/15          Failed - Vitamin D  in normal range and within 360 days    Vit D, 1,25-Dihydroxy  Date Value Ref Range Status  08/07/2019 71.4 19.9 - 79.3 pg/mL Final    Comment:    (NOTE) Performed At: [facility] [address] [place], [place] [id number] Pearlean Botts MD [id number]    25-Hydroxy, Vitamin D -3  Date Value Ref Range Status  07/27/2022 53 ng/mL Final    Comment:    (NOTE) This test was developed and its performance characteristics determined by [facility]. It has not been cleared or approved by the Food and Drug Administration. Performed At: [facility] [address] [place], [place] [id number] Alec American MD [id number]    25-Hydroxy, Vitamin D -2  Date Value Ref Range Status  07/27/2022 <1.0 ng/mL Final    Comment:    (NOTE) This test was developed and its performance characteristics determined by [facility]. It has not been cleared or approved by the Food and Drug Administration.    25-Hydroxy, Vitamin D   Date Value Ref Range Status  07/27/2022 53 ng/mL Final    Comment:    (NOTE) Reference Range: All Ages: Target levels 30 - 100    Vit D, 25-Hydroxy  Date Value Ref Range Status  08/07/2019 57.46 30 - 100 ng/mL Final    Comment:    (NOTE) Vitamin D  deficiency has been defined by the Institute of Medicine  and an Endocrine Society practice guideline  as a level of serum 25-OH  vitamin D  less than 20 ng/mL (1,2). The Endocrine Society went on to  further define vitamin D  insufficiency as a level between 21 and 29  ng/mL (2). 1. IOM (Institute of Medicine). 2010. Dietary reference intakes for  calcium  and D. [place]: [employer]. 2. Holick MF, Binkley Sheldon, Bischoff-Ferrari HA, et al. Evaluation,  treatment, and prevention of vitamin D  deficiency: an Endocrine  Society clinical practice guideline, JCEM. 2011 Jul; 96(7): 1911-30. Performed at [facility], [address], [place], [place] [address]          Failed - Cr in normal range and within 360 days    Creat  Date Value Ref Range Status  10/23/2022 0.56 (L) 0.60 - 1.00 mg/dL Final         Failed - Mg Level in normal range and within 360 days    Magnesium   Date Value Ref Range Status  07/27/2022 2.6 (H) 1.7 - 2.4 mg/dL Final    Comment:    Performed at [facility], [address] [place], [place] [address]         Failed - Phosphate in normal range and within 360 days    Phosphorus  Date Value Ref Range Status  08/07/2019 3.1 2.5 - 4.6 mg/dL Final    Comment:    Performed at [facility], [address], [place], [place] [address]         Failed - eGFR is 30 or above and within 360 days    GFR, Est African American  Date Value Ref Range Status  06/28/2020 105 > OR = 60 mL/min/1.35m2 Final   GFR, Est Non African American  Date Value Ref Range Status  06/28/2020 90 > OR = 60 mL/min/1.57m2 Final   GFR, Estimated  Date Value Ref Range Status  07/27/2022 >60 >60 mL/min Final    Comment:    (NOTE) Calculated using the CKD-EPI Creatinine Equation (2021)    eGFR  Date Value Ref Range Status  10/23/2022 94 > OR = 60 mL/min/1.48m2 Final         Failed - Valid encounter within last 12 months    Recent Outpatient Visits           Today Herpes zoster without complication   [facility] [facility]  [place], Rankin Buzzard, NP   1 month ago Acute cystitis with hematuria   [facility], Kayleen Party, DO              Failed - Bone Mineral Density or Dexa Scan completed in the last 2 years

## 2024-04-15 NOTE — Addendum Note (Signed)
 Addended by: Arabella Knife D on: 04/15/2024 01:54 PM   Modules accepted: Orders

## 2024-04-16 ENCOUNTER — Ambulatory Visit: Payer: Self-pay | Admitting: Internal Medicine

## 2024-04-16 LAB — CERVICOVAGINAL ANCILLARY ONLY
Bacterial Vaginitis (gardnerella): NEGATIVE
Candida Glabrata: POSITIVE — AB
Candida Vaginitis: NEGATIVE
Comment: NEGATIVE
Comment: NEGATIVE
Comment: NEGATIVE

## 2024-04-16 LAB — URINE CULTURE
MICRO NUMBER:: 16562367
Result:: NO GROWTH
SPECIMEN QUALITY:: ADEQUATE

## 2024-04-16 MED ORDER — FLUCONAZOLE 150 MG PO TABS
150.0000 mg | ORAL_TABLET | Freq: Once | ORAL | 0 refills | Status: AC
Start: 2024-04-16 — End: 2024-04-16

## 2024-04-30 DIAGNOSIS — J441 Chronic obstructive pulmonary disease with (acute) exacerbation: Secondary | ICD-10-CM | POA: Diagnosis not present

## 2024-04-30 DIAGNOSIS — J449 Chronic obstructive pulmonary disease, unspecified: Secondary | ICD-10-CM | POA: Diagnosis not present

## 2024-05-13 ENCOUNTER — Encounter: Payer: Self-pay | Admitting: Family Medicine

## 2024-05-13 ENCOUNTER — Ambulatory Visit (INDEPENDENT_AMBULATORY_CARE_PROVIDER_SITE_OTHER): Admitting: Family Medicine

## 2024-05-13 VITALS — BP 120/72 | HR 91 | Ht <= 58 in | Wt 76.6 lb

## 2024-05-13 DIAGNOSIS — I10 Essential (primary) hypertension: Secondary | ICD-10-CM

## 2024-05-13 DIAGNOSIS — F3176 Bipolar disorder, in full remission, most recent episode depressed: Secondary | ICD-10-CM

## 2024-05-13 DIAGNOSIS — G8929 Other chronic pain: Secondary | ICD-10-CM | POA: Diagnosis not present

## 2024-05-13 DIAGNOSIS — E440 Moderate protein-calorie malnutrition: Secondary | ICD-10-CM | POA: Diagnosis not present

## 2024-05-13 DIAGNOSIS — M25562 Pain in left knee: Secondary | ICD-10-CM

## 2024-05-13 DIAGNOSIS — M25561 Pain in right knee: Secondary | ICD-10-CM

## 2024-05-13 DIAGNOSIS — J432 Centrilobular emphysema: Secondary | ICD-10-CM

## 2024-05-13 DIAGNOSIS — M170 Bilateral primary osteoarthritis of knee: Secondary | ICD-10-CM | POA: Diagnosis not present

## 2024-05-13 DIAGNOSIS — R29898 Other symptoms and signs involving the musculoskeletal system: Secondary | ICD-10-CM | POA: Diagnosis not present

## 2024-05-13 DIAGNOSIS — S72042D Displaced fracture of base of neck of left femur, subsequent encounter for closed fracture with routine healing: Secondary | ICD-10-CM

## 2024-05-13 MED ORDER — BUSPIRONE HCL 15 MG PO TABS: 15.0000 mg | ORAL_TABLET | Freq: Two times a day (BID) | ORAL | 3 refills | Status: AC

## 2024-05-13 MED ORDER — BUPROPION HCL ER (XL) 150 MG PO TB24
150.0000 mg | ORAL_TABLET | Freq: Every day | ORAL | 1 refills | Status: AC
Start: 2024-05-13 — End: ?

## 2024-05-13 MED ORDER — OLANZAPINE 7.5 MG PO TABS: 7.5000 mg | ORAL_TABLET | Freq: Every day | ORAL | 3 refills | Status: AC

## 2024-05-13 MED ORDER — VENLAFAXINE HCL ER 75 MG PO CP24: 75.0000 mg | ORAL_CAPSULE | Freq: Every day | ORAL | 1 refills | Status: AC

## 2024-05-13 NOTE — Progress Notes (Signed)
 Subjective:    Patient ID: Pamela Ball, female    DOB: [birth year], 78 y.o.   MRN: [id number]  Pamela Ball is a 78 y.o. female presenting on 05/13/2024 for Bipolar Depression, Osteoarthritis Chronic Joint Pain   HPI  Discussed the use of AI scribe software for clinical note transcription with the patient, who gave verbal consent to proceed.  History of Present Illness   Pamela Ball is a 78 year old female who presents for evaluation of home hospice care options. Here with sister in law Bonita Talley  Moderate Protein Calorie Malnutrition Emphysema COPD Bilateral Lower Extremity Weakness History of Hip Fracture Osteoarthritis Advanced Knees, Hips Bipolar Depression  Palliative and hospice care needs - Continued weight loss - Limited mobility, wheelchair dependent - Lack of caregiver support, has family support, husband no longer able to assist due to his decline in health on hospice now. Currently her husband is receiving hospital-level care at home - Interested in home evaluation for her also to [facility] for in-home hospice services if eligible - Previously received home health services through [facility] in May and June, which have ended  Psychiatric medication management - On antidepressants for twenty years - Experiencing difficulty with medication management due to psychiatrist leaving the practice - Currently off many psychiatric medications, negatively impacting mental health - Medication regimen includes olanzapine , venlafaxine , bupropion  (Wellbutrin ), and buspirone  (Buspar ) - Concerned about maintaining medication regimen and seeking assistance with refills          05/13/2024   11:11 AM 02/22/2024    3:18 PM 03/15/2023    9:33 AM  Depression screen PHQ 2/9  Decreased Interest 1 0 0  Down, Depressed, Hopeless 3 3 0  PHQ - 2 Score 4 3 0  Altered sleeping 0 2   Tired, decreased energy 3 3   Change in appetite 0 3   Feeling bad or failure about yourself  0 0    Trouble concentrating 0 0   Moving slowly or fidgety/restless 1 0   Suicidal thoughts 0 0   PHQ-9 Score 8 11   Difficult doing work/chores Not difficult at all Not difficult at all        05/13/2024   11:12 AM 02/22/2024    3:18 PM 03/15/2023    9:33 AM 02/01/2023    8:51 AM  GAD 7 : Generalized Anxiety Score  Nervous, Anxious, on Edge 3 3 0 0  Control/stop worrying 3 3 0 0  Worry too much - different things 3 1 0 0  Trouble relaxing 3 3 0 0  Restless 3 3 0 0  Easily annoyed or irritable 0 1 0 0  Afraid - awful might happen 3 0 0 0  Total GAD 7 Score 18 14 0 0  Anxiety Difficulty Not difficult at all Very difficult  Not difficult at all    Social History   Tobacco Use   Smoking status: Never   Smokeless tobacco: Never  Vaping Use   Vaping status: Never Used  Substance Use Topics   Alcohol use: No    Comment: drink occasionally    Drug use: No    Review of Systems Per HPI unless specifically indicated above     Objective:    BP 120/72 (BP Location: Left Arm, Patient Position: Sitting, Cuff Size: Normal)   Pulse 91   Ht 4' 10 (1.473 m)   Wt 76 lb 9.6 oz (34.7 kg)   SpO2 92%   BMI 16.01  kg/m   Wt Readings from Last 3 Encounters:  05/13/24 76 lb 9.6 oz (34.7 kg)  04/15/24 80 lb 12.8 oz (36.7 kg)  03/30/24 84 lb 7 oz (38.3 kg)    Physical Exam Vitals and nursing note reviewed.  Constitutional:      General: She is not in acute distress.    Appearance: Normal appearance. She is well-developed. She is not diaphoretic.     Comments: Well-appearing thin frail 78 yr female comfortable, frail thin cooperative  HENT:     Head: Normocephalic and atraumatic.  Eyes:     General:        Right eye: No discharge.        Left eye: No discharge.     Conjunctiva/sclera: Conjunctivae normal.  Cardiovascular:     Rate and Rhythm: Normal rate.  Pulmonary:     Effort: Pulmonary effort is normal.  Musculoskeletal:     Comments: In wheelchair  Skin:    General: Skin is  warm and dry.     Findings: No erythema or rash.  Neurological:     Mental Status: She is alert and oriented to person, place, and time.  Psychiatric:        Mood and Affect: Mood normal.        Behavior: Behavior normal.        Thought Content: Thought content normal.     Comments: Well groomed, good eye contact, normal speech and thoughts     Results for orders placed or performed in visit on 04/15/24  Cervicovaginal ancillary only   Collection Time: 04/15/24 10:18 AM  Result Value Ref Range   Bacterial Vaginitis (gardnerella) Negative    Candida Vaginitis Negative    Candida Glabrata Positive (A)    Comment Normal Reference Range Candida Species - Negative    Comment Normal Reference Range Candida Galbrata - Negative    Comment      Normal Reference Range Bacterial Vaginosis - Negative  POCT URINE DIPSTICK   Collection Time: 04/15/24  1:54 PM  Result Value Ref Range   Color, UA yellow yellow   Clarity, UA clear clear   Glucose, UA negative negative mg/dL   Bilirubin, UA small (A) negative   Ketones, POC UA trace (5) (A) negative mg/dL   Spec Grav, UA 8.984 8.989 - 1.025   Blood, UA trace-intact (A) negative   pH, UA 6.5 5.0 - 8.0   POC PROTEIN,UA trace negative, trace   Urobilinogen, UA 0.2 0.2 or 1.0 E.U./dL   Nitrite, UA Negative Negative   Leukocytes, UA Trace (A) Negative  Urine Culture   Collection Time: 04/15/24  1:57 PM   Specimen: Urine  Result Value Ref Range   MICRO NUMBER: [id number]    SPECIMEN QUALITY: Adequate    Sample Source URINE    STATUS: FINAL    Result: No Growth       Assessment & Plan:   Problem List Items Addressed This Visit     Bipolar 1 disorder, depressed, full remission (HCC) - Primary   Relevant Medications   OLANZapine  (ZYPREXA ) 7.5 MG tablet   venlafaxine  XR (EFFEXOR -XR) 75 MG 24 hr capsule   buPROPion  (WELLBUTRIN  XL) 150 MG 24 hr tablet   busPIRone  (BUSPAR ) 15 MG tablet   Other Relevant Orders   Ambulatory referral to  Hospice   AMB Referral VBCI Care Management   COPD (chronic obstructive pulmonary disease) (HCC)   Relevant Orders   Ambulatory referral to Hospice   AMB  Referral VBCI Care Management   Moderate protein-calorie malnutrition (HCC)   Relevant Orders   Ambulatory referral to Hospice   AMB Referral VBCI Care Management   Osteoarthritis of knees, bilateral   Relevant Orders   Ambulatory referral to Hospice   AMB Referral VBCI Care Management   Other Visit Diagnoses       Benign hypertension         Bilateral chronic knee pain       Relevant Medications   venlafaxine  XR (EFFEXOR -XR) 75 MG 24 hr capsule   buPROPion  (WELLBUTRIN  XL) 150 MG 24 hr tablet     Closed displaced basicervical fracture of left femur with routine healing, subsequent encounter       Relevant Orders   Ambulatory referral to Hospice   AMB Referral VBCI Care Management     Weakness of both lower extremities       Relevant Orders   Ambulatory referral to Hospice   AMB Referral VBCI Care Management        Bipolar Depression No longer followed by Psychiatry Dr Daniel [facility] has stopped practicing Long-standing depression, previously managed with antidepressants. Current lack of psychiatric provider and medication discontinuation affecting mental health. - Agree to re order current medications until she can relocate to new Psychiatry specialist - Prescribed olanzapine  7.5mg  daily 30 days with three refills. - Prescribed venlafaxine  and Wellbutrin  for 90 days with refills. - Prescribed Buspar  (buspirone ) 30 days with three refills. - Advised to seek a new psychiatric provider from the list provided by Dr. Linward office.  Emphysema COPD / Chronic Advanced Osteoarthritis Knees, Hips History of Hip fracture. Bilateral lower extremity weakness. Limited ambulation, using wheelchair Goals of Care Desires in-home caregiver support. Husband on hospice care through [facility]. Aware of financial  implications and coverage limitations. Interested in home hospice evaluation. Considering alternative options if not eligible for hospice. - Referred to [facility] for home evaluation to assess hospice eligibility. - Coordinated with Bobbette Acron, RN case manager, to explore resources and plan for in-home care. Consider temporary placement or hiring in-home caregivers.      Orders Placed This Encounter  Procedures   Ambulatory referral to Hospice    Referral Priority:   Routine    Referral Type:   Consultation    Referral Reason:   Specialty Services Required    Requested Specialty:   Hospice Services    Number of Visits Requested:   1   AMB Referral VBCI Care Management    Referral Priority:   Routine    Referral Type:   Consultation    Referral Reason:   Care Coordination    Number of Visits Requested:   1    Meds ordered this encounter  Medications   OLANZapine  (ZYPREXA ) 7.5 MG tablet    Sig: Take 1 tablet (7.5 mg total) by mouth daily.    Dispense:  30 tablet    Refill:  3   venlafaxine  XR (EFFEXOR -XR) 75 MG 24 hr capsule    Sig: Take 1 capsule (75 mg total) by mouth daily with breakfast.    Dispense:  90 capsule    Refill:  1   buPROPion  (WELLBUTRIN  XL) 150 MG 24 hr tablet    Sig: Take 1 tablet (150 mg total) by mouth daily.    Dispense:  90 tablet    Refill:  1   busPIRone  (BUSPAR ) 15 MG tablet    Sig: Take 1 tablet (15 mg total) by mouth 2 (two) times  daily.    Dispense:  60 tablet    Refill:  3    Follow up plan: Return if symptoms worsen or fail to improve.  Marsa Officer, DO [facility] 05/13/2024, 11:13 AM

## 2024-05-13 NOTE — Patient Instructions (Addendum)
 Thank you for coming to the office today.  Refilled Buspar  and Olanzapine  (Dr Daniel medications) 30 days + 3 refills  Refilled Venlafaxine  and Wellbutrin  - from me, for 90 days with refill  Referral to Jackson Acron - case manager  Referral to [facility] Evaluation  Based on those results, we can consider next options if need any other help in home, or temporary placement.   Please schedule a Follow-up Appointment to: No follow-ups on file.  If you have any other questions or concerns, please feel free to call the office or send a message through MyChart. You may also schedule an earlier appointment if necessary.  Additionally, you may be receiving a survey about your experience at our office within a few days to 1 week by e-mail or mail. We value your feedback.  Marsa Officer, DO [facility], [place]

## 2024-05-15 ENCOUNTER — Other Ambulatory Visit: Payer: Self-pay

## 2024-05-15 NOTE — Patient Outreach (Signed)
 Complex Care Management   Visit Note  05/15/2024  Name:  Pamela Ball MRN: [id number] DOB: 1946-03-08  Situation: Referral received for Complex Care Management related to Assistance with In-Home Hospice I obtained verbal consent from Caregiver.  Visit completed with patient's sister-in-law/caregiver [facility] via telephone. Collaborated with the [facility] team regarding plan for hospice admission.  Background:   Past Medical History:  Diagnosis Date   Abdominal hernia with obstruction and without gangrene    Anxiety 02/15/2017   denies   Asthma    Bipolar 1 disorder, depressed, full remission (HCC) 02/15/2017   Bipolar affective disorder (HCC)    COPD (chronic obstructive pulmonary disease) (HCC)    Depression    GERD (gastroesophageal reflux disease) 02/15/2017   Glaucoma    Headache    Hiatal hernia    History of abdominal hernia 05/21/2017   History of compression fracture of spine 02/15/2017   Hyperlipidemia    Hypertension    Incisional hernia    Incisional ventral hernia w obstruction 06/02/2017   Normocytic anemia 05/23/2017   Osteoarthritis of knees, 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   Presbycusis of both ears 02/15/2017    Completed outreach with patient's caregiver/sister-in-law Pamela Ball and collaborated with the [employer] team regarding plan for home hospice. Team confirmed that Pamela Ball's assessment was able to be completed today and she has been admitted for home hospice.  Pamela Ball reports current plan is to transition oversight of care to the [facility] In-home physician. Agreed to call for any changes or if additional assistance is required from the PCP or Complex Care team.   Will update PCP and complete case closure.    Jackson Acron [facility] Population Health RN Care Manager Direct Dial: [phone]  Fax: [phone] Website: [email or web address]

## 2024-05-30 DIAGNOSIS — J441 Chronic obstructive pulmonary disease with (acute) exacerbation: Secondary | ICD-10-CM | POA: Diagnosis not present

## 2024-05-30 DIAGNOSIS — J449 Chronic obstructive pulmonary disease, unspecified: Secondary | ICD-10-CM | POA: Diagnosis not present

## 2024-06-08 ENCOUNTER — Other Ambulatory Visit: Payer: Self-pay | Admitting: Family Medicine

## 2024-06-08 DIAGNOSIS — F3176 Bipolar disorder, in full remission, most recent episode depressed: Secondary | ICD-10-CM

## 2024-06-10 NOTE — Telephone Encounter (Signed)
 Requested medication (s) are due for refill today: yes, for 90 days  Requested medication (s) are on the active medication list: yes  Last refill:  05/13/24  Future visit scheduled: yes  Notes to clinic:  Unable to refill per protocol, cannot delegate. 90 day prescription is being requested, routing for approval.      Requested Prescriptions  Pending Prescriptions Disp Refills   OLANZapine  (ZYPREXA ) 7.5 MG tablet [Pharmacy Med Name: OLANZAPINE  7.5 MG TABLET] 90 tablet 2    Sig: TAKE 1 TABLET BY MOUTH EVERY DAY     Not Delegated - Psychiatry:  Antipsychotics - Second Generation (Atypical) - olanzapine  Failed - 06/10/2024  8:38 AM      Failed - This refill cannot be delegated      Failed - TSH in normal range and within 360 days    TSH  Date Value Ref Range Status  06/28/2020 2.66 0.40 - 4.50 mIU/L Final         Failed - Lipid Panel in normal range within the last 12 months    Cholesterol  Date Value Ref Range Status  06/28/2020 215 (H) <200 mg/dL Final   LDL Cholesterol (Calc)  Date Value Ref Range Status  06/28/2020 114 (H) mg/dL (calc) Final    Comment:    Reference range: <100 . Desirable range <100 mg/dL for primary prevention;   <70 mg/dL for patients with CHD or diabetic patients  with > or = 2 CHD risk factors. SABRA LDL-C is now calculated using the Martin-Hopkins  calculation, which is a validated novel method providing  better accuracy than the Friedewald equation in the  estimation of LDL-C.  Gladis APPLETHWAITE et al. SANDREA. 7986;689(80): 2061-2068  (http://education.[employer].com/faq/FAQ164)    HDL  Date Value Ref Range Status  06/28/2020 77 > OR = 50 mg/dL Final   Triglycerides  Date Value Ref Range Status  06/28/2020 125 <150 mg/dL Final         Failed - CBC within normal limits and completed in the last 12 months    WBC  Date Value Ref Range Status  10/23/2022 5.9 3.8 - 10.8 Thousand/uL Final   RBC  Date Value Ref Range Status  10/23/2022 4.52  3.80 - 5.10 Million/uL Final   Hemoglobin  Date Value Ref Range Status  10/23/2022 14.9 11.7 - 15.5 g/dL Final   HGB  Date Value Ref Range Status  02/08/2015 14.1 12.0 - 16.0 g/dL Final   HCT  Date Value Ref Range Status  10/23/2022 43.6 35.0 - 45.0 % Final  02/08/2015 42.3 35.0 - 47.0 % Final   MCHC  Date Value Ref Range Status  10/23/2022 34.2 32.0 - 36.0 g/dL Final   [facility]  Date Value Ref Range Status  10/23/2022 33.0 27.0 - 33.0 pg Final   MCV  Date Value Ref Range Status  10/23/2022 96.5 80.0 - 100.0 fL Final  02/08/2015 98 80 - 100 fL Final   No results found for: PLTCOUNTKUC, LABPLAT, POCPLA RDW  Date Value Ref Range Status  10/23/2022 11.9 11.0 - 15.0 % Final  02/08/2015 15.1 (H) 11.5 - 14.5 % Final         Failed - CMP within normal limits and completed in the last 12 months    Albumin  Date Value Ref Range Status  07/27/2022 4.1 3.5 - 5.0 g/dL Final  95/95/7983 3.5 g/dL Final    Comment:    6.4-4.9 NOTE: New reference range  01/12/15    Alkaline Phosphatase  Date Value Ref Range Status  07/27/2022 100 38 - 126 U/L Final  02/08/2015 217 (H) U/L Final    Comment:    38-126 NOTE: New Reference Range  01/12/15    Alkaline phosphatase (APISO)  Date Value Ref Range Status  10/23/2022 93 37 - 153 U/L Final   ALT  Date Value Ref Range Status  10/23/2022 13 6 - 29 U/L Final   SGPT (ALT)  Date Value Ref Range Status  02/08/2015 19 U/L Final    Comment:    14-54 NOTE: New Reference Range  01/12/15    AST  Date Value Ref Range Status  10/23/2022 19 10 - 35 U/L Final   SGOT(AST)  Date Value Ref Range Status  02/08/2015 24 U/L Final    Comment:    15-41 NOTE: New Reference Range  01/12/15    BUN  Date Value Ref Range Status  10/23/2022 12 7 - 25 mg/dL Final  95/95/7983 9 mg/dL Final    Comment:    3-79 NOTE: New Reference Range  01/12/15    Calcium   Date Value Ref Range Status  10/23/2022 9.6 8.6 - 10.4 mg/dL Final    Calcium , Total  Date Value Ref Range Status  02/08/2015 9.2 mg/dL Final    Comment:    1.0-89.6 NOTE: New Reference Range  01/12/15    CO2  Date Value Ref Range Status  10/23/2022 33 (H) 20 - 32 mmol/L Final   Co2  Date Value Ref Range Status  02/08/2015 28 mmol/L Final    Comment:    22-32 NOTE: New Reference Range  01/12/15    Creat  Date Value Ref Range Status  10/23/2022 0.56 (L) 0.60 - 1.00 mg/dL Final   Glucose  Date Value Ref Range Status  02/08/2015 113 (H) mg/dL Final    Comment:    34-00 NOTE: New Reference Range  01/12/15    Glucose, Bld  Date Value Ref Range Status  10/23/2022 118 (H) 65 - 99 mg/dL Final    Comment:    .            Fasting reference interval . For someone without known diabetes, a glucose value between 100 and 125 mg/dL is consistent with prediabetes and should be confirmed with a follow-up test. .    Glucose-Capillary  Date Value Ref Range Status  06/03/2017 158 (H) 65 - 99 mg/dL Final   Potassium  Date Value Ref Range Status  10/23/2022 4.6 3.5 - 5.3 mmol/L Final  02/08/2015 3.7 mmol/L Final    Comment:    3.5-5.1 NOTE: New Reference Range  01/12/15    Sodium  Date Value Ref Range Status  10/23/2022 140 135 - 146 mmol/L Final  02/08/2015 138 mmol/L Final    Comment:    135-145 NOTE: New Reference Range  01/12/15    Total Bilirubin  Date Value Ref Range Status  10/23/2022 0.4 0.2 - 1.2 mg/dL Final   Bilirubin,Total  Date Value Ref Range Status  02/08/2015 0.7 mg/dL Final    Comment:    9.6-8.7 NOTE: New Reference Range  01/12/15    Protein, ur  Date Value Ref Range Status  09/27/2023 NEGATIVE NEGATIVE mg/dL Final   Protein, UA  Date Value Ref Range Status  02/22/2024 Negative Negative Final   Total Protein  Date Value Ref Range Status  10/23/2022 6.7 6.1 - 8.1 g/dL Final  95/95/7983 6.6 g/dL Final    Comment:    3.4-1.8 NOTE: New Reference Range  01/12/15    Total Protein ELP   Date Value Ref Range Status  03/08/2021 6.8 6.0 - 8.5 g/dL Corrected   GFR, Est African American  Date Value Ref Range Status  06/28/2020 105 > OR = 60 mL/min/1.16m2 Final   eGFR  Date Value Ref Range Status  10/23/2022 94 > OR = 60 mL/min/1.30m2 Final   GFR, Est Non African American  Date Value Ref Range Status  06/28/2020 90 > OR = 60 mL/min/1.23m2 Final   GFR, Estimated  Date Value Ref Range Status  07/27/2022 >60 >60 mL/min Final    Comment:    (NOTE) Calculated using the CKD-EPI Creatinine Equation (2021)          Passed - Completed PHQ-2 or PHQ-9 in the last 360 days      Passed - Last BP in normal range    BP Readings from Last 1 Encounters:  05/13/24 120/72         Passed - Last Heart Rate in normal range    Pulse Readings from Last 1 Encounters:  05/13/24 91         Passed - Valid encounter within last 6 months    Recent Outpatient Visits           4 weeks ago Bipolar 1 disorder, depressed, full remission [facility])   [place] [facility] [place], [place] J, DO   1 month ago Herpes zoster without complication   [place] [place], [place] W, NP   3 months ago Acute cystitis with hematuria   [place] [facility] [place], Marsa PARAS, [place]

## 2024-06-19 ENCOUNTER — Other Ambulatory Visit: Payer: Self-pay | Admitting: Family Medicine

## 2024-06-19 DIAGNOSIS — I10 Essential (primary) hypertension: Secondary | ICD-10-CM

## 2024-06-23 NOTE — Telephone Encounter (Signed)
 Requested Prescriptions  Pending Prescriptions Disp Refills   amLODipine  (NORVASC ) 10 MG tablet [Pharmacy Med Name: AMLODIPINE  BESYLATE 10 MG TAB] 90 tablet 0    Sig: TAKE 1 TABLET BY MOUTH EVERY DAY     Cardiovascular: Calcium  Channel Blockers 2 Passed - 06/23/2024  9:06 AM      Passed - Last BP in normal range    BP Readings from Last 1 Encounters:  05/13/24 120/72         Passed - Last Heart Rate in normal range    Pulse Readings from Last 1 Encounters:  05/13/24 91         Passed - Valid encounter within last 6 months    Recent Outpatient Visits           1 month ago Bipolar 1 disorder, depressed, full remission [facility])   [facility] [place], Marsa PARAS, DO   2 months ago Herpes zoster without complication   [place] [facility] [place], [place] W, NP   4 months ago Acute cystitis with hematuria   [place] [facility] [place], Marsa PARAS, [place]

## 2024-06-30 DIAGNOSIS — J441 Chronic obstructive pulmonary disease with (acute) exacerbation: Secondary | ICD-10-CM | POA: Diagnosis not present

## 2024-06-30 DIAGNOSIS — J449 Chronic obstructive pulmonary disease, unspecified: Secondary | ICD-10-CM | POA: Diagnosis not present

## 2024-07-31 DIAGNOSIS — J441 Chronic obstructive pulmonary disease with (acute) exacerbation: Secondary | ICD-10-CM | POA: Diagnosis not present

## 2024-07-31 DIAGNOSIS — J449 Chronic obstructive pulmonary disease, unspecified: Secondary | ICD-10-CM | POA: Diagnosis not present

## 2024-08-04 DIAGNOSIS — S72322A Displaced transverse fracture of shaft of left femur, initial encounter for closed fracture: Secondary | ICD-10-CM | POA: Diagnosis not present

## 2024-08-04 DIAGNOSIS — Z96652 Presence of left artificial knee joint: Secondary | ICD-10-CM | POA: Diagnosis not present

## 2024-08-04 DIAGNOSIS — Z471 Aftercare following joint replacement surgery: Secondary | ICD-10-CM | POA: Diagnosis not present

## 2024-08-10 ENCOUNTER — Other Ambulatory Visit: Payer: Self-pay | Admitting: Family Medicine

## 2024-08-10 DIAGNOSIS — K219 Gastro-esophageal reflux disease without esophagitis: Secondary | ICD-10-CM

## 2024-08-12 NOTE — Telephone Encounter (Signed)
 Requested medications are due for refill today.  40 mg  - yes  Requested medications are on the active medications list.  yes  Last refill. 40mg  refilled 02/22/2024 #90 1 rf  Future visit scheduled.   no  Notes to clinic.  20mg  is not on med list. Dr. Karlene listed as PCP.    Requested Prescriptions  Pending Prescriptions Disp Refills   omeprazole  (PRILOSEC) 20 MG capsule [Pharmacy Med Name: OMEPRAZOLE  DR 20 MG CAPSULE] 90 capsule 1    Sig: TAKE 1 CAPSULE BY MOUTH EVERY DAY BEFORE BREAKFAST     Gastroenterology: Proton Pump Inhibitors Passed - 08/12/2024 11:01 AM      Passed - Valid encounter within last 12 months    Recent Outpatient Visits           3 months ago Bipolar 1 disorder, depressed, full remission   [place] [facility] [place], Marsa PARAS, DO   3 months ago Herpes zoster without complication   [place] [facility] [place], [place] W, NP   5 months ago Acute cystitis with hematuria   [place] [facility] [place], Marsa PARAS, DO               omeprazole  (PRILOSEC) 40 MG capsule [Pharmacy Med Name: OMEPRAZOLE  DR 40 MG CAPSULE] 90 capsule 1    Sig: TAKE 1 CAPSULE BY MOUTH EVERY DAY BEFORE BREAKFAST     Gastroenterology: Proton Pump Inhibitors Passed - 08/12/2024 11:01 AM      Passed - Valid encounter within last 12 months    Recent Outpatient Visits           3 months ago Bipolar 1 disorder, depressed, full remission   [place] [facility] [place], Marsa PARAS, DO   3 months ago Herpes zoster without complication   [place] [facility] [place], [place] W, NP   5 months ago Acute cystitis with hematuria   [place] [place], Marsa PARAS, [place]

## 2024-08-30 DIAGNOSIS — J441 Chronic obstructive pulmonary disease with (acute) exacerbation: Secondary | ICD-10-CM | POA: Diagnosis not present

## 2024-08-30 DIAGNOSIS — J449 Chronic obstructive pulmonary disease, unspecified: Secondary | ICD-10-CM | POA: Diagnosis not present

## 2024-10-03 ENCOUNTER — Other Ambulatory Visit: Payer: Self-pay | Admitting: Family Medicine

## 2024-10-03 DIAGNOSIS — F3176 Bipolar disorder, in full remission, most recent episode depressed: Secondary | ICD-10-CM

## 2024-10-03 DIAGNOSIS — I10 Essential (primary) hypertension: Secondary | ICD-10-CM

## 2024-10-07 NOTE — Telephone Encounter (Signed)
 Requested medication (s) are due for refill today: Yes  Requested medication (s) are on the active medication list: Yes  Last refill:  05/23/24  Future visit scheduled: No  Notes to clinic:  Not delegated.    Requested Prescriptions  Pending Prescriptions Disp Refills   OLANZapine  (ZYPREXA ) 7.5 MG tablet [Pharmacy Med Name: OLANZAPINE  7.5 MG TABLET] 30 tablet 3    Sig: TAKE 1 TABLET BY MOUTH EVERY DAY     Not Delegated - Psychiatry:  Antipsychotics - Second Generation (Atypical) - olanzapine  Failed - 10/07/2024 12:38 PM      Failed - This refill cannot be delegated      Failed - TSH in normal range and within 360 days    TSH  Date Value Ref Range Status  06/28/2020 2.66 0.40 - 4.50 mIU/L Final         Failed - Lipid Panel in normal range within the last 12 months    Cholesterol  Date Value Ref Range Status  06/28/2020 215 (H) <200 mg/dL Final   LDL Cholesterol (Calc)  Date Value Ref Range Status  06/28/2020 114 (H) mg/dL (calc) Final    Comment:    Reference range: <100 . Desirable range <100 mg/dL for primary prevention;   <70 mg/dL for patients with CHD or diabetic patients  with > or = 2 CHD risk factors. SABRA LDL-C is now calculated using the Martin-Hopkins  calculation, which is a validated novel method providing  better accuracy than the Friedewald equation in the  estimation of LDL-C.  Gladis APPLETHWAITE et al. SANDREA. 7986;689(80): 2061-2068  (http://education.[facility].com/faq/FAQ164)    HDL  Date Value Ref Range Status  06/28/2020 77 > OR = 50 mg/dL Final   Triglycerides  Date Value Ref Range Status  06/28/2020 125 <150 mg/dL Final         Failed - CBC within normal limits and completed in the last 12 months    WBC  Date Value Ref Range Status  10/23/2022 5.9 3.8 - 10.8 Thousand/uL Final   RBC  Date Value Ref Range Status  10/23/2022 4.52 3.80 - 5.10 Million/uL Final   Hemoglobin  Date Value Ref Range Status  10/23/2022 14.9 11.7 - 15.5 g/dL  Final   HGB  Date Value Ref Range Status  02/08/2015 14.1 12.0 - 16.0 g/dL Final   HCT  Date Value Ref Range Status  10/23/2022 43.6 35.0 - 45.0 % Final  02/08/2015 42.3 35.0 - 47.0 % Final   MCHC  Date Value Ref Range Status  10/23/2022 34.2 32.0 - 36.0 g/dL Final   [facility]  Date Value Ref Range Status  10/23/2022 33.0 27.0 - 33.0 pg Final   MCV  Date Value Ref Range Status  10/23/2022 96.5 80.0 - 100.0 fL Final  02/08/2015 98 80 - 100 fL Final   No results found for: PLTCOUNTKUC, LABPLAT, POCPLA RDW  Date Value Ref Range Status  10/23/2022 11.9 11.0 - 15.0 % Final  02/08/2015 15.1 (H) 11.5 - 14.5 % Final         Failed - CMP within normal limits and completed in the last 12 months    Albumin  Date Value Ref Range Status  07/27/2022 4.1 3.5 - 5.0 g/dL Final  95/95/7983 3.5 g/dL Final    Comment:    6.4-4.9 NOTE: New reference range  01/12/15    Alkaline Phosphatase  Date Value Ref Range Status  07/27/2022 100 38 - 126 U/L Final  02/08/2015 217 (H) U/L Final  Comment:    38-126 NOTE: New Reference Range  01/12/15    Alkaline phosphatase (APISO)  Date Value Ref Range Status  10/23/2022 93 37 - 153 U/L Final   ALT  Date Value Ref Range Status  10/23/2022 13 6 - 29 U/L Final   SGPT (ALT)  Date Value Ref Range Status  02/08/2015 19 U/L Final    Comment:    14-54 NOTE: New Reference Range  01/12/15    AST  Date Value Ref Range Status  10/23/2022 19 10 - 35 U/L Final   SGOT(AST)  Date Value Ref Range Status  02/08/2015 24 U/L Final    Comment:    15-41 NOTE: New Reference Range  01/12/15    BUN  Date Value Ref Range Status  10/23/2022 12 7 - 25 mg/dL Final  95/95/7983 9 mg/dL Final    Comment:    3-79 NOTE: New Reference Range  01/12/15    Calcium   Date Value Ref Range Status  10/23/2022 9.6 8.6 - 10.4 mg/dL Final   Calcium , Total  Date Value Ref Range Status  02/08/2015 9.2 mg/dL Final    Comment:    1.0-89.6 NOTE:  New Reference Range  01/12/15    CO2  Date Value Ref Range Status  10/23/2022 33 (H) 20 - 32 mmol/L Final   Co2  Date Value Ref Range Status  02/08/2015 28 mmol/L Final    Comment:    22-32 NOTE: New Reference Range  01/12/15    Creat  Date Value Ref Range Status  10/23/2022 0.56 (L) 0.60 - 1.00 mg/dL Final   Glucose  Date Value Ref Range Status  02/08/2015 113 (H) mg/dL Final    Comment:    34-00 NOTE: New Reference Range  01/12/15    Glucose, Bld  Date Value Ref Range Status  10/23/2022 118 (H) 65 - 99 mg/dL Final    Comment:    .            Fasting reference interval . For someone without known diabetes, a glucose value between 100 and 125 mg/dL is consistent with prediabetes and should be confirmed with a follow-up test. .    Glucose-Capillary  Date Value Ref Range Status  06/03/2017 158 (H) 65 - 99 mg/dL Final   Potassium  Date Value Ref Range Status  10/23/2022 4.6 3.5 - 5.3 mmol/L Final  02/08/2015 3.7 mmol/L Final    Comment:    3.5-5.1 NOTE: New Reference Range  01/12/15    Sodium  Date Value Ref Range Status  10/23/2022 140 135 - 146 mmol/L Final  02/08/2015 138 mmol/L Final    Comment:    135-145 NOTE: New Reference Range  01/12/15    Total Bilirubin  Date Value Ref Range Status  10/23/2022 0.4 0.2 - 1.2 mg/dL Final   Bilirubin,Total  Date Value Ref Range Status  02/08/2015 0.7 mg/dL Final    Comment:    9.6-8.7 NOTE: New Reference Range  01/12/15    Protein, ur  Date Value Ref Range Status  09/27/2023 NEGATIVE NEGATIVE mg/dL Final   Protein, UA  Date Value Ref Range Status  02/22/2024 Negative Negative Final   Total Protein  Date Value Ref Range Status  10/23/2022 6.7 6.1 - 8.1 g/dL Final  95/95/7983 6.6 g/dL Final    Comment:    3.4-1.8 NOTE: New Reference Range  01/12/15    Total Protein ELP  Date Value Ref Range Status  03/08/2021 6.8 6.0 - 8.5 g/dL Corrected  GFR, Est African American  Date Value  Ref Range Status  06/28/2020 105 > OR = 60 mL/min/1.58m2 Final   eGFR  Date Value Ref Range Status  10/23/2022 94 > OR = 60 mL/min/1.56m2 Final   GFR, Est Non African American  Date Value Ref Range Status  06/28/2020 90 > OR = 60 mL/min/1.86m2 Final   GFR, Estimated  Date Value Ref Range Status  07/27/2022 >60 >60 mL/min Final    Comment:    (NOTE) Calculated using the CKD-EPI Creatinine Equation (2021)          Passed - Completed PHQ-2 or PHQ-9 in the last 360 days      Passed - Last BP in normal range    BP Readings from Last 1 Encounters:  05/13/24 120/72         Passed - Last Heart Rate in normal range    Pulse Readings from Last 1 Encounters:  05/13/24 91         Passed - Valid encounter within last 6 months    Recent Outpatient Visits           4 months ago Bipolar 1 disorder, depressed, full remission   [place] [facility] [place], Marsa PARAS, DO   5 months ago Herpes zoster without complication   [place] [facility] [place], [place] W, NP   7 months ago Acute cystitis with hematuria   [place] [facility] [place], Marsa PARAS, DO              Signed Prescriptions Disp Refills   amLODipine  (NORVASC ) 10 MG tablet 30 tablet 2    Sig: TAKE 1 TABLET BY MOUTH EVERY DAY     Cardiovascular: Calcium  Channel Blockers 2 Passed - 10/07/2024 12:38 PM      Passed - Last BP in normal range    BP Readings from Last 1 Encounters:  05/13/24 120/72         Passed - Last Heart Rate in normal range    Pulse Readings from Last 1 Encounters:  05/13/24 91         Passed - Valid encounter within last 6 months    Recent Outpatient Visits           4 months ago Bipolar 1 disorder, depressed, full remission   [place] [facility] [place], Marsa PARAS, DO   5 months ago Herpes zoster without complication   [place] [facility] [place], [place] W, NP   7 months  ago Acute cystitis with hematuria   [facility] [place], Marsa PARAS, [place]

## 2024-10-07 NOTE — Telephone Encounter (Signed)
 Requested Prescriptions  Pending Prescriptions Disp Refills   OLANZapine  (ZYPREXA ) 7.5 MG tablet [Pharmacy Med Name: OLANZAPINE  7.5 MG TABLET] 30 tablet 3    Sig: TAKE 1 TABLET BY MOUTH EVERY DAY     Not Delegated - Psychiatry:  Antipsychotics - Second Generation (Atypical) - olanzapine  Failed - 10/07/2024 12:37 PM      Failed - This refill cannot be delegated      Failed - TSH in normal range and within 360 days    TSH  Date Value Ref Range Status  06/28/2020 2.66 0.40 - 4.50 mIU/L Final         Failed - Lipid Panel in normal range within the last 12 months    Cholesterol  Date Value Ref Range Status  06/28/2020 215 (H) <200 mg/dL Final   LDL Cholesterol (Calc)  Date Value Ref Range Status  06/28/2020 114 (H) mg/dL (calc) Final    Comment:    Reference range: <100 . Desirable range <100 mg/dL for primary prevention;   <70 mg/dL for patients with CHD or diabetic patients  with > or = 2 CHD risk factors. SABRA LDL-C is now calculated using the Martin-Hopkins  calculation, which is a validated novel method providing  better accuracy than the Friedewald equation in the  estimation of LDL-C.  Gladis APPLETHWAITE et al. SANDREA. 7986;689(80): 2061-2068  (http://education.[facility].com/faq/FAQ164)    HDL  Date Value Ref Range Status  06/28/2020 77 > OR = 50 mg/dL Final   Triglycerides  Date Value Ref Range Status  06/28/2020 125 <150 mg/dL Final         Failed - CBC within normal limits and completed in the last 12 months    WBC  Date Value Ref Range Status  10/23/2022 5.9 3.8 - 10.8 Thousand/uL Final   RBC  Date Value Ref Range Status  10/23/2022 4.52 3.80 - 5.10 Million/uL Final   Hemoglobin  Date Value Ref Range Status  10/23/2022 14.9 11.7 - 15.5 g/dL Final   HGB  Date Value Ref Range Status  02/08/2015 14.1 12.0 - 16.0 g/dL Final   HCT  Date Value Ref Range Status  10/23/2022 43.6 35.0 - 45.0 % Final  02/08/2015 42.3 35.0 - 47.0 % Final   MCHC  Date Value  Ref Range Status  10/23/2022 34.2 32.0 - 36.0 g/dL Final   [facility]  Date Value Ref Range Status  10/23/2022 33.0 27.0 - 33.0 pg Final   MCV  Date Value Ref Range Status  10/23/2022 96.5 80.0 - 100.0 fL Final  02/08/2015 98 80 - 100 fL Final   No results found for: PLTCOUNTKUC, LABPLAT, POCPLA RDW  Date Value Ref Range Status  10/23/2022 11.9 11.0 - 15.0 % Final  02/08/2015 15.1 (H) 11.5 - 14.5 % Final         Failed - CMP within normal limits and completed in the last 12 months    Albumin  Date Value Ref Range Status  07/27/2022 4.1 3.5 - 5.0 g/dL Final  95/95/7983 3.5 g/dL Final    Comment:    6.4-4.9 NOTE: New reference range  01/12/15    Alkaline Phosphatase  Date Value Ref Range Status  07/27/2022 100 38 - 126 U/L Final  02/08/2015 217 (H) U/L Final    Comment:    38-126 NOTE: New Reference Range  01/12/15    Alkaline phosphatase (APISO)  Date Value Ref Range Status  10/23/2022 93 37 - 153 U/L Final   ALT  Date Value Ref  Range Status  10/23/2022 13 6 - 29 U/L Final   SGPT (ALT)  Date Value Ref Range Status  02/08/2015 19 U/L Final    Comment:    14-54 NOTE: New Reference Range  01/12/15    AST  Date Value Ref Range Status  10/23/2022 19 10 - 35 U/L Final   SGOT(AST)  Date Value Ref Range Status  02/08/2015 24 U/L Final    Comment:    15-41 NOTE: New Reference Range  01/12/15    BUN  Date Value Ref Range Status  10/23/2022 12 7 - 25 mg/dL Final  95/95/7983 9 mg/dL Final    Comment:    3-79 NOTE: New Reference Range  01/12/15    Calcium   Date Value Ref Range Status  10/23/2022 9.6 8.6 - 10.4 mg/dL Final   Calcium , Total  Date Value Ref Range Status  02/08/2015 9.2 mg/dL Final    Comment:    1.0-89.6 NOTE: New Reference Range  01/12/15    CO2  Date Value Ref Range Status  10/23/2022 33 (H) 20 - 32 mmol/L Final   Co2  Date Value Ref Range Status  02/08/2015 28 mmol/L Final    Comment:    22-32 NOTE: New Reference  Range  01/12/15    Creat  Date Value Ref Range Status  10/23/2022 0.56 (L) 0.60 - 1.00 mg/dL Final   Glucose  Date Value Ref Range Status  02/08/2015 113 (H) mg/dL Final    Comment:    34-00 NOTE: New Reference Range  01/12/15    Glucose, Bld  Date Value Ref Range Status  10/23/2022 118 (H) 65 - 99 mg/dL Final    Comment:    .            Fasting reference interval . For someone without known diabetes, a glucose value between 100 and 125 mg/dL is consistent with prediabetes and should be confirmed with a follow-up test. .    Glucose-Capillary  Date Value Ref Range Status  06/03/2017 158 (H) 65 - 99 mg/dL Final   Potassium  Date Value Ref Range Status  10/23/2022 4.6 3.5 - 5.3 mmol/L Final  02/08/2015 3.7 mmol/L Final    Comment:    3.5-5.1 NOTE: New Reference Range  01/12/15    Sodium  Date Value Ref Range Status  10/23/2022 140 135 - 146 mmol/L Final  02/08/2015 138 mmol/L Final    Comment:    135-145 NOTE: New Reference Range  01/12/15    Total Bilirubin  Date Value Ref Range Status  10/23/2022 0.4 0.2 - 1.2 mg/dL Final   Bilirubin,Total  Date Value Ref Range Status  02/08/2015 0.7 mg/dL Final    Comment:    9.6-8.7 NOTE: New Reference Range  01/12/15    Protein, ur  Date Value Ref Range Status  09/27/2023 NEGATIVE NEGATIVE mg/dL Final   Protein, UA  Date Value Ref Range Status  02/22/2024 Negative Negative Final   Total Protein  Date Value Ref Range Status  10/23/2022 6.7 6.1 - 8.1 g/dL Final  95/95/7983 6.6 g/dL Final    Comment:    3.4-1.8 NOTE: New Reference Range  01/12/15    Total Protein ELP  Date Value Ref Range Status  03/08/2021 6.8 6.0 - 8.5 g/dL Corrected   GFR, Est African American  Date Value Ref Range Status  06/28/2020 105 > OR = 60 mL/min/1.75m2 Final   eGFR  Date Value Ref Range Status  10/23/2022 94 > OR = 60 mL/min/1.17m2  Final   GFR, Est Non African American  Date Value Ref Range Status   06/28/2020 90 > OR = 60 mL/min/1.25m2 Final   GFR, Estimated  Date Value Ref Range Status  07/27/2022 >60 >60 mL/min Final    Comment:    (NOTE) Calculated using the CKD-EPI Creatinine Equation (2021)          Passed - Completed PHQ-2 or PHQ-9 in the last 360 days      Passed - Last BP in normal range    BP Readings from Last 1 Encounters:  05/13/24 120/72         Passed - Last Heart Rate in normal range    Pulse Readings from Last 1 Encounters:  05/13/24 91         Passed - Valid encounter within last 6 months    Recent Outpatient Visits           4 months ago Bipolar 1 disorder, depressed, full remission   [place] [facility] [place], Marsa PARAS, DO   5 months ago Herpes zoster without complication   [place] [facility] [place], [place] W, NP   7 months ago Acute cystitis with hematuria   [facility] [place], Marsa PARAS, DO               amLODipine  (NORVASC ) 10 MG tablet [Pharmacy Med Name: AMLODIPINE  BESYLATE 10 MG TAB] 30 tablet 2    Sig: TAKE 1 TABLET BY MOUTH EVERY DAY     Cardiovascular: Calcium  Channel Blockers 2 Passed - 10/07/2024 12:37 PM      Passed - Last BP in normal range    BP Readings from Last 1 Encounters:  05/13/24 120/72         Passed - Last Heart Rate in normal range    Pulse Readings from Last 1 Encounters:  05/13/24 91         Passed - Valid encounter within last 6 months    Recent Outpatient Visits           4 months ago Bipolar 1 disorder, depressed, full remission   [place] [facility] [place], Marsa PARAS, DO   5 months ago Herpes zoster without complication   [place] [facility] [place], [place] W, NP   7 months ago Acute cystitis with hematuria   [facility] [place], Marsa PARAS, [place]

## 2024-12-02 ENCOUNTER — Other Ambulatory Visit: Payer: Self-pay | Admitting: Family Medicine

## 2024-12-02 DIAGNOSIS — E782 Mixed hyperlipidemia: Secondary | ICD-10-CM

## 2024-12-02 NOTE — Telephone Encounter (Signed)
 Requested medication (s) are due for refill today: Yes  Requested medication (s) are on the active medication list: Yes  Last refill:  12/03/23  Future visit scheduled: No  Notes to clinic:  Unable to refill per protocol due to failed labs, no updated results.      Requested Prescriptions  Pending Prescriptions Disp Refills   simvastatin  (ZOCOR ) 20 MG tablet [Pharmacy Med Name: SIMVASTATIN  20 MG TABLET] 90 tablet 3    Sig: TAKE 1 TABLET BY MOUTH EVERY DAY     Cardiovascular:  Antilipid - Statins Failed - 12/02/2024  4:43 PM      Failed - Lipid Panel in normal range within the last 12 months    Cholesterol  Date Value Ref Range Status  06/28/2020 215 (H) <200 mg/dL Final   LDL Cholesterol (Calc)  Date Value Ref Range Status  06/28/2020 114 (H) mg/dL (calc) Final    Comment:    Reference range: <100 . Desirable range <100 mg/dL for primary prevention;   <70 mg/dL for patients with CHD or diabetic patients  with > or = 2 CHD risk factors. SABRA LDL-C is now calculated using the Martin-Hopkins  calculation, which is a validated novel method providing  better accuracy than the Friedewald equation in the  estimation of LDL-C.  Gladis APPLETHWAITE et al. SANDREA. 7986;689(80): 2061-2068  (http://education.[facility].com/faq/FAQ164)    HDL  Date Value Ref Range Status  06/28/2020 77 > OR = 50 mg/dL Final   Triglycerides  Date Value Ref Range Status  06/28/2020 125 <150 mg/dL Final         Passed - Patient is not pregnant      Passed - Valid encounter within last 12 months    Recent Outpatient Visits           6 months ago Bipolar 1 disorder, depressed, full remission   [place] [facility] [place], Marsa PARAS, DO   7 months ago Herpes zoster without complication   [place] [facility] [place], Angeline ORN, NP   9 months ago Acute cystitis with hematuria   [facility] [place], Marsa PARAS, [place]
